# Patient Record
Sex: Female | Born: 1975
Health system: Southern US, Community
[De-identification: ages and names within clinical notes are randomized; demographics above are authoritative.]

## PROBLEM LIST (undated history)

## (undated) DIAGNOSIS — N750 Cyst of Bartholin's gland: Secondary | ICD-10-CM

## (undated) DIAGNOSIS — D649 Anemia, unspecified: Secondary | ICD-10-CM

## (undated) DIAGNOSIS — C539 Malignant neoplasm of cervix uteri, unspecified: Secondary | ICD-10-CM

## (undated) DIAGNOSIS — R002 Palpitations: Secondary | ICD-10-CM

## (undated) DIAGNOSIS — D219 Benign neoplasm of connective and other soft tissue, unspecified: Secondary | ICD-10-CM

## (undated) DIAGNOSIS — E119 Type 2 diabetes mellitus without complications: Secondary | ICD-10-CM

## (undated) DIAGNOSIS — J342 Deviated nasal septum: Secondary | ICD-10-CM

## (undated) DIAGNOSIS — G4730 Sleep apnea, unspecified: Secondary | ICD-10-CM

## (undated) DIAGNOSIS — Z923 Personal history of irradiation: Secondary | ICD-10-CM

## (undated) DIAGNOSIS — Z789 Other specified health status: Secondary | ICD-10-CM

## (undated) DIAGNOSIS — Z9289 Personal history of other medical treatment: Secondary | ICD-10-CM

## (undated) DIAGNOSIS — R7303 Prediabetes: Secondary | ICD-10-CM

## (undated) DIAGNOSIS — E669 Obesity, unspecified: Secondary | ICD-10-CM

## (undated) DIAGNOSIS — K5900 Constipation, unspecified: Secondary | ICD-10-CM

## (undated) DIAGNOSIS — G629 Polyneuropathy, unspecified: Secondary | ICD-10-CM

## (undated) DIAGNOSIS — N189 Chronic kidney disease, unspecified: Secondary | ICD-10-CM

## (undated) DIAGNOSIS — R0602 Shortness of breath: Secondary | ICD-10-CM

## (undated) DIAGNOSIS — I10 Essential (primary) hypertension: Secondary | ICD-10-CM

## (undated) DIAGNOSIS — Z87448 Personal history of other diseases of urinary system: Secondary | ICD-10-CM

## (undated) DIAGNOSIS — F32A Depression, unspecified: Secondary | ICD-10-CM

## (undated) DIAGNOSIS — F329 Major depressive disorder, single episode, unspecified: Secondary | ICD-10-CM

## (undated) DIAGNOSIS — E785 Hyperlipidemia, unspecified: Secondary | ICD-10-CM

## (undated) DIAGNOSIS — M1990 Unspecified osteoarthritis, unspecified site: Secondary | ICD-10-CM

## (undated) DIAGNOSIS — T783XXA Angioneurotic edema, initial encounter: Secondary | ICD-10-CM

## (undated) DIAGNOSIS — F419 Anxiety disorder, unspecified: Secondary | ICD-10-CM

## (undated) DIAGNOSIS — R112 Nausea with vomiting, unspecified: Secondary | ICD-10-CM

## (undated) DIAGNOSIS — R1310 Dysphagia, unspecified: Secondary | ICD-10-CM

## (undated) DIAGNOSIS — O1490 Unspecified pre-eclampsia, unspecified trimester: Secondary | ICD-10-CM

## (undated) DIAGNOSIS — Z9889 Other specified postprocedural states: Secondary | ICD-10-CM

## (undated) HISTORY — DX: Prediabetes: R73.03

## (undated) HISTORY — PX: DILATION AND CURETTAGE OF UTERUS: SHX78

## (undated) HISTORY — DX: Depression, unspecified: F32.A

## (undated) HISTORY — DX: Shortness of breath: R06.02

## (undated) HISTORY — DX: Anemia, unspecified: D64.9

## (undated) HISTORY — DX: Palpitations: R00.2

## (undated) HISTORY — DX: Chronic kidney disease, unspecified: N18.9

## (undated) HISTORY — DX: Unspecified osteoarthritis, unspecified site: M19.90

## (undated) HISTORY — DX: Personal history of irradiation: Z92.3

## (undated) HISTORY — DX: Anxiety disorder, unspecified: F41.9

## (undated) HISTORY — DX: Malignant neoplasm of cervix uteri, unspecified: C53.9

## (undated) HISTORY — DX: Hyperlipidemia, unspecified: E78.5

## (undated) HISTORY — DX: Benign neoplasm of connective and other soft tissue, unspecified: D21.9

## (undated) HISTORY — DX: Personal history of other diseases of urinary system: Z87.448

## (undated) HISTORY — DX: Angioneurotic edema, initial encounter: T78.3XXA

## (undated) HISTORY — DX: Deviated nasal septum: J34.2

## (undated) HISTORY — DX: Cyst of Bartholin's gland: N75.0

## (undated) HISTORY — DX: Sleep apnea, unspecified: G47.30

## (undated) HISTORY — DX: Dysphagia, unspecified: R13.10

## (undated) HISTORY — DX: Obesity, unspecified: E66.9

## (undated) HISTORY — DX: Constipation, unspecified: K59.00

---
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
## 2004-03-02 DIAGNOSIS — O1490 Unspecified pre-eclampsia, unspecified trimester: Secondary | ICD-10-CM

## 2004-03-02 HISTORY — DX: Unspecified pre-eclampsia, unspecified trimester: O14.90

## 2004-11-10 DIAGNOSIS — O1490 Unspecified pre-eclampsia, unspecified trimester: Secondary | ICD-10-CM

## 2016-04-04 ENCOUNTER — Ambulatory Visit (HOSPITAL_COMMUNITY)
Admission: EM | Admit: 2016-04-04 | Discharge: 2016-04-04 | Disposition: A | Discharge status: Home / Self Care | Payer: Self-pay | Attending: Family Medicine | Admitting: Family Medicine

## 2016-04-04 ENCOUNTER — Encounter (HOSPITAL_COMMUNITY): Payer: Self-pay | Admitting: *Deleted

## 2016-04-04 DIAGNOSIS — Z760 Encounter for issue of repeat prescription: Secondary | ICD-10-CM

## 2016-04-04 DIAGNOSIS — I10 Essential (primary) hypertension: Secondary | ICD-10-CM

## 2016-04-04 HISTORY — DX: Unspecified pre-eclampsia, unspecified trimester: O14.90

## 2016-04-04 HISTORY — DX: Essential (primary) hypertension: I10

## 2016-04-04 MED ORDER — AMLODIPINE BESYLATE 10 MG PO TABS: 10.0000 mg | ORAL_TABLET | Freq: Every day | ORAL | 0 refills | Status: DC

## 2016-04-04 NOTE — ED Triage Notes (Signed)
Denies any pain, just "feel it in my eyes" that BP is elevated.

## 2016-04-04 NOTE — ED Triage Notes (Signed)
Pt recently moved to [facility]; does not have PCP yet.  Ran out of amlodipine approx 1 wk ago.  Normally takes 10mg  daily.

## 2016-04-04 NOTE — ED Provider Notes (Signed)
CSN: [id number]     Arrival date & time 04/04/16  1446 History   First MD Initiated Contact with Patient 04/04/16 1631     Chief Complaint  Patient presents with  . Medication Refill   (Consider location/radiation/quality/duration/timing/severity/associated sxs/prior Treatment) She is a well-appearing 41 year old female, with history of hypertension currently on amlodipine 10 mg daily, presents today for medication refill for amlodipine. Patient ran out of her medication 1 week. Patient recently moved to the area and does not have a PCP yet, however she has appointment scheduled on the 02/12 with a new PCP. Patient states that she cannot wait until the 2/12 to get her BP medication. Patient denies chest pain, dizziness or shortness of breath. She does report a mild headache and a little blurry vision both onset today. She reports that her blood pressure normally gets this elevated when she is out of her medicine. She does not want to go to the ER.      Past Medical History:  Diagnosis Date  . Hypertension   . Preeclampsia    Past Surgical History:  Procedure Laterality Date  . CESAREAN SECTION    . DILATION AND CURETTAGE OF UTERUS     No family history on file. Social History  Substance Use Topics  . Smoking status: Never Smoker  . Smokeless tobacco: Never Used  . Alcohol use No   OB History    No data available     Review of Systems  Constitutional: Negative.   Eyes:       +blurry vision  Respiratory: Negative for cough and shortness of breath.   Cardiovascular: Negative for chest pain and palpitations.  Gastrointestinal: Negative for abdominal pain and nausea.  Neurological: Positive for headaches. Negative for dizziness.    Allergies  Penicillins and Sulfa antibiotics  Home Medications   Prior to Admission medications   Medication Sig Start Date End Date Taking? Authorizing Provider  amLODipine (NORVASC) 10 MG tablet Take 1 tablet (10 mg total) by mouth daily.  04/04/16 07/03/16  Barry Dienes, NP   Meds Ordered and Administered this Visit  Medications - No data to display  BP (!) 219/108 (BP Location: Right Arm)   Pulse 101   Temp 98 F (36.7 C) (Oral)   Resp 16   LMP 03/14/2016 (Approximate)   SpO2 99%  No data found.   Physical Exam  Urgent Care Course     Procedures (including critical care time)  Labs Review Labs Reviewed - No data to display  Imaging Review No results found.  MDM   1. Essential hypertension   2. Medication refill    Physical exam unremarkable. She appears well. She refused ER visit. Amlodipine refilled. Informed to f/u as scheduled with the PCP. Return precaution discussed.     Barry Dienes, NP 04/04/16 [phone]

## 2016-06-28 ENCOUNTER — Ambulatory Visit (HOSPITAL_COMMUNITY)
Admission: EM | Admit: 2016-06-28 | Discharge: 2016-06-28 | Disposition: A | Discharge status: Home / Self Care | Payer: Self-pay | Attending: Internal Medicine | Admitting: Internal Medicine

## 2016-06-28 ENCOUNTER — Encounter (HOSPITAL_COMMUNITY): Payer: Self-pay | Admitting: Emergency Medicine

## 2016-06-28 DIAGNOSIS — I10 Essential (primary) hypertension: Secondary | ICD-10-CM

## 2016-06-28 DIAGNOSIS — Z760 Encounter for issue of repeat prescription: Secondary | ICD-10-CM

## 2016-06-28 MED ORDER — CLONIDINE HCL 0.1 MG PO TABS: 0.1000 mg | ORAL_TABLET | Freq: Two times a day (BID) | ORAL | 0 refills | Status: DC | PRN

## 2016-06-28 MED ORDER — TRIAMTERENE-HCTZ 37.5-25 MG PO CAPS: 1.0000 | ORAL_CAPSULE | Freq: Every day | ORAL | 0 refills | Status: DC

## 2016-06-28 MED ORDER — CLONIDINE HCL 0.1 MG PO TABS
ORAL_TABLET | ORAL | Status: AC
  Filled 2016-06-28: qty 2

## 2016-06-28 MED ORDER — AMLODIPINE BESYLATE 10 MG PO TABS: 10.0000 mg | ORAL_TABLET | Freq: Every day | ORAL | 0 refills | Status: DC

## 2016-06-28 MED ORDER — CLONIDINE HCL 0.1 MG PO TABS
0.2000 mg | ORAL_TABLET | Freq: Once | ORAL | Status: AC
  Administered 2016-06-28: 0.2 mg via ORAL

## 2016-06-28 NOTE — ED Notes (Signed)
Dr. Valere Dross advised of patient's BP. Advised to give 0.2 mg of clonidine.

## 2016-06-28 NOTE — ED Provider Notes (Signed)
[place]    CSN: [id number] Arrival date & time: 06/28/16  1233     History   Chief Complaint Chief Complaint  Patient presents with  . Medication Refill    HPI Tamara Osborne is a 41 y.o. female. She has hypertension, and is out of her blood pressure medicine. She does have an appointment with a primary care provider in [place], to establish care, but was not able to get an appointment until the end of June. No chest pain, no headache. She does sometimes see spots when her blood pressure is very high, and she observed this earlier today. She is here for high blood pressure medicine refill.  Not short of breath, not coughing. Does have distal lower extremity swelling, symmetric    HPI  Past Medical History:  Diagnosis Date  . Hypertension   . Preeclampsia      Past Surgical History:  Procedure Laterality Date  . CESAREAN SECTION    . DILATION AND CURETTAGE OF UTERUS       Home Medications    Prior to Admission medications   Medication Sig Start Date End Date Taking? Authorizing Provider  amLODipine (NORVASC) 10 MG tablet Take 1 tablet (10 mg total) by mouth daily. 06/28/16 09/26/16  Sherlene Shams, MD  cloNIDine (CATAPRES) 0.1 MG tablet Take 1 tablet (0.1 mg total) by mouth 2 (two) times daily as needed (blood pressure >180/110). 06/28/16   Sherlene Shams, MD  triamterene-hydrochlorothiazide (DYAZIDE) 37.5-25 MG capsule Take 1 each (1 capsule total) by mouth daily. For 3 days, then as needed for leg swelling. 06/28/16 07/08/16  Sherlene Shams, MD    Family History History reviewed. No pertinent family history.  Social History Social History  Substance Use Topics  . Smoking status: Never Smoker  . Smokeless tobacco: Never Used  . Alcohol use No     Allergies   Penicillins and Sulfa antibiotics   Review of Systems Review of Systems  All other systems reviewed and are negative.    Physical Exam Triage Vital Signs ED Triage Vitals  Enc  Vitals Group     BP 06/28/16 1358 (!) 224/128     Pulse Rate 06/28/16 1358 (!) 101     Resp 06/28/16 1358 20     Temp 06/28/16 1358 98.4 F (36.9 C)     Temp Source 06/28/16 1358 Oral     SpO2 06/28/16 1358 99 %     Weight --      Height --      Pain Score 06/28/16 1356 0     Pain Loc --    Updated Vital Signs BP (!) 222/120 (BP Location: Left Wrist)   Pulse 99   Temp 98.4 F (36.9 C) (Oral)   Resp 18   SpO2 98%   Physical Exam  Constitutional: She is oriented to person, place, and time. No distress.  HENT:  Head: Atraumatic.  Eyes:  Conjugate gaze observed, no eye redness/discharge  Neck: Neck supple.  Cardiovascular: Normal rate and regular rhythm.   S4 gallop  Pulmonary/Chest: No respiratory distress. She has no wheezes.  Lungs are clear with possible bibasilar fine crackles, otherwise unremarkable  Abdominal: She exhibits no distension.  Musculoskeletal: Normal range of motion.  Distal lower extremity edema, 2+, pitting  Neurological: She is alert and oriented to person, place, and time.  Face is symmetric, speech is clear/coherent Able to walk into the urgent care independently, climb on/off the exam table  Skin: Skin  is warm and dry.  Nursing note and vitals reviewed.    UC Treatments / Results   Procedures Procedures (including critical care time)  Medications Ordered in UC Medications  cloNIDine (CATAPRES) tablet 0.2 mg (0.2 mg Oral Given 06/28/16 1414)     Final Clinical Impressions(s) / UC Diagnoses   Final diagnoses:  Medication refill  Hypertension, unspecified type   Please keep appointment with primary care provider late June 2018 to establish care.  Prescriptions for amlodipine (to take regularly) and clonidine (to use as needed) were sent to the [facility] on [address].  A dose of clonidine 0.2 mg was given at the urgent care today and is expected to work gradually over the next several hours.  Meds ordered this encounter  Medications  .  amLODipine (NORVASC) 10 MG tablet    Sig: Take 1 tablet (10 mg total) by mouth daily.    Dispense:  90 tablet    Refill:  0  . cloNIDine (CATAPRES) 0.1 MG tablet    Sig: Take 1 tablet (0.1 mg total) by mouth 2 (two) times daily as needed (blood pressure >180/110).    Dispense:  50 tablet    Refill:  0  . triamterene-hydrochlorothiazide (DYAZIDE) 37.5-25 MG capsule    Sig: Take 1 each (1 capsule total) by mouth daily. For 3 days, then as needed for leg swelling.    Dispense:  10 capsule    Refill:  0      Sherlene Shams, MD 07/01/16 [phone]

## 2016-06-28 NOTE — Discharge Instructions (Addendum)
Please keep appointment with primary care provider late June 2018 to establish care.  Prescriptions for amlodipine (to take regularly) and clonidine (to use as needed) were sent to the [facility] on [address].  A dose of clonidine 0.2 mg was given at the urgent care today and is expected to work gradually over the next several hours.

## 2016-06-28 NOTE — ED Triage Notes (Signed)
The patient presented to the [facility] with a complaint of needing her HTN medication refilled.

## 2019-03-13 ENCOUNTER — Other Ambulatory Visit: Payer: Self-pay

## 2019-03-13 DIAGNOSIS — N939 Abnormal uterine and vaginal bleeding, unspecified: Secondary | ICD-10-CM | POA: Insufficient documentation

## 2019-03-13 DIAGNOSIS — Z5321 Procedure and treatment not carried out due to patient leaving prior to being seen by health care provider: Secondary | ICD-10-CM | POA: Insufficient documentation

## 2019-03-13 NOTE — ED Triage Notes (Signed)
Patient stating that she has significant bleeding due to her menstrual cycle for the last week. States passing large clots, which is not abnormal for her, but is lasting longer than usual.

## 2019-03-14 ENCOUNTER — Encounter (HOSPITAL_COMMUNITY): Payer: Self-pay

## 2019-03-14 ENCOUNTER — Ambulatory Visit (HOSPITAL_COMMUNITY)
Admission: EM | Admit: 2019-03-14 | Discharge: 2019-03-14 | Disposition: A | Discharge status: Home / Self Care | Payer: Self-pay | Attending: Family Medicine | Admitting: Family Medicine

## 2019-03-14 ENCOUNTER — Other Ambulatory Visit: Payer: Self-pay

## 2019-03-14 ENCOUNTER — Emergency Department (HOSPITAL_COMMUNITY)
Admission: EM | Admit: 2019-03-14 | Discharge: 2019-03-14 | Disposition: A | Discharge status: Home / Self Care | Payer: Medicaid Other | Attending: Emergency Medicine | Admitting: Emergency Medicine

## 2019-03-14 DIAGNOSIS — N921 Excessive and frequent menstruation with irregular cycle: Secondary | ICD-10-CM

## 2019-03-14 MED ORDER — MEDROXYPROGESTERONE ACETATE 10 MG PO TABS: 10.0000 mg | ORAL_TABLET | Freq: Every day | ORAL | 0 refills | Status: DC

## 2019-03-14 NOTE — ED Notes (Signed)
Pt not seen in lobby when called for vitals.

## 2019-03-14 NOTE — ED Provider Notes (Signed)
[place]    CSN: [id number] Arrival date & time: 03/14/19  1219      History   Chief Complaint Chief Complaint  Patient presents with  . period issues    HPI Tamara Osborne is a 44 y.o. female.   Patient is a 44 year old female past medical history of hypertension.  She presents today with heavy menstrual cycle.  Reporting started her menstrual cycle approximately 1 week ago.  She has had some large clots at times.  Reporting the bleeding is worse at nighttime. Wearing a diaper but not soaking through.  Denies any abdominal pain or pelvic pain.  Has had menorrhagia her whole life.  Had tubal ligation for birth control.  Does not currently have an OB/GYN. Has been told in the past that she may need an ablation.  Denies any fever, dysuria, hematuria or urinary frequency.  Not currently sexually active.  No concern for STDs. Hx of anemia but denies any weakness, dizziness, shortness of breath, lightheadedness.   ROS per HPI      Past Medical History:  Diagnosis Date  . Hypertension   . Preeclampsia     There are no problems to display for this patient.   Past Surgical History:  Procedure Laterality Date  . CESAREAN SECTION    . DILATION AND CURETTAGE OF UTERUS      OB History   No obstetric history on file.      Home Medications    Prior to Admission medications   Medication Sig Start Date End Date Taking? Authorizing Provider  amLODipine (NORVASC) 10 MG tablet Take 1 tablet (10 mg total) by mouth daily. 06/28/16 09/26/16  Wynona Luna, MD  cloNIDine (CATAPRES) 0.1 MG tablet Take 1 tablet (0.1 mg total) by mouth 2 (two) times daily as needed (blood pressure >180/110). 06/28/16   Wynona Luna, MD  medroxyPROGESTERone (PROVERA) 10 MG tablet Take 1 tablet (10 mg total) by mouth daily for 10 days. 03/14/19 03/24/19  Loura Halt A, NP  triamterene-hydrochlorothiazide (DYAZIDE) 37.5-25 MG capsule Take 1 each (1 capsule total) by mouth daily.  For 3 days, then as needed for leg swelling. 06/28/16 07/08/16  Wynona Luna, MD    Family History History reviewed. No pertinent family history.  Social History Social History   Tobacco Use  . Smoking status: Never Smoker  . Smokeless tobacco: Never Used  Substance Use Topics  . Alcohol use: No  . Drug use: No     Allergies   Penicillins and Sulfa antibiotics   Review of Systems Review of Systems   Physical Exam Triage Vital Signs ED Triage Vitals  Enc Vitals Group     BP 03/14/19 1254 (!) 191/89     Pulse Rate 03/14/19 1254 (!) 105     Resp 03/14/19 1254 18     Temp 03/14/19 1254 98.7 F (37.1 C)     Temp Source 03/14/19 1254 Oral     SpO2 03/14/19 1254 100 %     Weight 03/14/19 1257 215 lb (97.5 kg)     Height --      Head Circumference --      Peak Flow --      Pain Score 03/14/19 1256 6     Pain Loc --      Pain Edu? --      Excl. in [place]? --    No data found.  Updated Vital Signs BP (!) 191/89 (BP Location: Right Arm)   Pulse Marland Kitchen)  105   Temp 98.7 F (37.1 C) (Oral)   Resp 18   Wt 215 lb (97.5 kg)   LMP 03/14/2019   SpO2 100%   BMI 39.32 kg/m   Visual Acuity Right Eye Distance:   Left Eye Distance:   Bilateral Distance:    Right Eye Near:   Left Eye Near:    Bilateral Near:     Physical Exam Vitals and nursing note reviewed.  Constitutional:      General: She is not in acute distress.    Appearance: Normal appearance. She is not ill-appearing, toxic-appearing or diaphoretic.  HENT:     Head: Normocephalic.     Nose: Nose normal.     Mouth/Throat:     Pharynx: Oropharynx is clear.  Eyes:     Conjunctiva/sclera: Conjunctivae normal.  Pulmonary:     Effort: Pulmonary effort is normal.  Abdominal:     Palpations: Abdomen is soft.     Tenderness: There is no abdominal tenderness.  Musculoskeletal:        General: Normal range of motion.     Cervical back: Normal range of motion.  Skin:    General: Skin is warm and dry.      Findings: No rash.  Neurological:     Mental Status: She is alert.  Psychiatric:        Mood and Affect: Mood normal.      UC Treatments / Results  Labs (all labs ordered are listed, but only abnormal results are displayed) Labs Reviewed - No data to display  EKG   Radiology No results found.  Procedures Procedures (including critical care time)  Medications Ordered in UC Medications - No data to display  Initial Impression / Assessment and Plan / UC Course  I have reviewed the triage vital signs and the nursing notes.  Pertinent labs & imaging results that were available during my care of the patient were reviewed by me and considered in my medical decision making (see chart for details).     Metrorrhagia- giving provera to stop the bleeding.  She is not having any pain and there is no concern for pregnancy or ectopic pregnancy at this time.  She could have a uterine  fibroid.  Recommend she follows up with OB/GYN for further management.  If symptoms worsen and she starts having signs or symptoms of anemia she will need to go to the ER Pt understanding and agree.  Final Clinical Impressions(s) / UC Diagnoses   Final diagnoses:  Metrorrhagia     Discharge Instructions     Take the medication daily for 10 days.  Call the OB/GYN listed above If symptoms worsen go to the ER.     ED Prescriptions    Medication Sig Dispense Auth. Provider   medroxyPROGESTERone (PROVERA) 10 MG tablet Take 1 tablet (10 mg total) by mouth daily for 10 days. 10 tablet Loura Halt A, NP     PDMP not reviewed this encounter.   Orvan July, NP 03/14/19 1529

## 2019-03-14 NOTE — Discharge Instructions (Signed)
Take the medication daily for 10 days.  Call the OB/GYN listed above If symptoms worsen go to the ER.

## 2019-03-14 NOTE — ED Triage Notes (Signed)
Pt states she has a issue with her period. Pt states she  was bleeding a lot yesterday.

## 2019-03-15 ENCOUNTER — Encounter (HOSPITAL_COMMUNITY): Payer: Self-pay | Admitting: Emergency Medicine

## 2019-03-15 ENCOUNTER — Emergency Department (HOSPITAL_COMMUNITY)
Admission: EM | Admit: 2019-03-15 | Discharge: 2019-03-16 | Disposition: A | Discharge status: Home / Self Care | Payer: Medicaid Other | Attending: Emergency Medicine | Admitting: Emergency Medicine

## 2019-03-15 DIAGNOSIS — Z79899 Other long term (current) drug therapy: Secondary | ICD-10-CM | POA: Insufficient documentation

## 2019-03-15 DIAGNOSIS — I10 Essential (primary) hypertension: Secondary | ICD-10-CM | POA: Insufficient documentation

## 2019-03-15 DIAGNOSIS — F419 Anxiety disorder, unspecified: Secondary | ICD-10-CM | POA: Diagnosis not present

## 2019-03-15 DIAGNOSIS — D500 Iron deficiency anemia secondary to blood loss (chronic): Secondary | ICD-10-CM

## 2019-03-15 LAB — CBC WITH DIFFERENTIAL/PLATELET
Abs Immature Granulocytes: 0.26 10*3/uL — ABNORMAL HIGH (ref 0.00–0.07)
Basophils Absolute: 0.1 10*3/uL (ref 0.0–0.1)
Basophils Relative: 0 %
Eosinophils Absolute: 0.4 10*3/uL (ref 0.0–0.5)
Eosinophils Relative: 2 %
HCT: 20.7 % — ABNORMAL LOW (ref 36.0–46.0)
Hemoglobin: 6.1 g/dL — CL (ref 12.0–15.0)
Immature Granulocytes: 1 %
Lymphocytes Relative: 11 %
Lymphs Abs: 2 10*3/uL (ref 0.7–4.0)
MCH: 22.1 pg — ABNORMAL LOW (ref 26.0–34.0)
MCHC: 29.5 g/dL — ABNORMAL LOW (ref 30.0–36.0)
MCV: 75 fL — ABNORMAL LOW (ref 80.0–100.0)
Monocytes Absolute: 0.9 10*3/uL (ref 0.1–1.0)
Monocytes Relative: 5 %
Neutro Abs: 14.8 10*3/uL — ABNORMAL HIGH (ref 1.7–7.7)
Neutrophils Relative %: 81 %
Platelets: 623 10*3/uL — ABNORMAL HIGH (ref 150–400)
RBC: 2.76 MIL/uL — ABNORMAL LOW (ref 3.87–5.11)
RDW: 17.3 % — ABNORMAL HIGH (ref 11.5–15.5)
WBC: 18.5 10*3/uL — ABNORMAL HIGH (ref 4.0–10.5)
nRBC: 0.5 % — ABNORMAL HIGH (ref 0.0–0.2)

## 2019-03-15 LAB — BASIC METABOLIC PANEL
Anion gap: 11 (ref 5–15)
BUN: 18 mg/dL (ref 6–20)
CO2: 20 mmol/L — ABNORMAL LOW (ref 22–32)
Calcium: 8.8 mg/dL — ABNORMAL LOW (ref 8.9–10.3)
Chloride: 108 mmol/L (ref 98–111)
Creatinine, Ser: 1.37 mg/dL — ABNORMAL HIGH (ref 0.44–1.00)
GFR calc Af Amer: 55 mL/min — ABNORMAL LOW (ref >60)
GFR calc non Af Amer: 47 mL/min — ABNORMAL LOW (ref >60)
Glucose, Bld: 108 mg/dL — ABNORMAL HIGH (ref 70–99)
Potassium: 4 mmol/L (ref 3.5–5.1)
Sodium: 139 mmol/L (ref 135–145)

## 2019-03-15 LAB — PREPARE RBC (CROSSMATCH)

## 2019-03-15 LAB — ABO/RH: ABO/RH(D): A NEG

## 2019-03-15 MED ORDER — SODIUM CHLORIDE 0.9 % IV SOLN
10.0000 mL/h | Freq: Once | INTRAVENOUS | Status: AC
  Administered 2019-03-15: 21:00:00 10 mL/h via INTRAVENOUS

## 2019-03-15 NOTE — ED Notes (Signed)
Pt ambulated to restroom with assistance. Pt back in bed and resting comfortably.

## 2019-03-15 NOTE — ED Provider Notes (Signed)
[place] EMERGENCY DEPARTMENT Provider Note   CSN: [id number] Arrival date & time: 03/15/19  1110     History Chief Complaint  Patient presents with  . Hypertension    Tamara Osborne is a 44 y.o. female with a past medical history of hypertension currently on clonidine as needed and amlodipine daily presenting to the ED for hypertension and increased anxiety.  States that for the past few days she has been under increased stress and anxiety due to increased menstrual bleeding.  She states that she has a longstanding history of "white coat hypertension because seeing blood and being in hospitals makes me so scared, I lost both my parents in my 29s and had a miscarriage a long time ago."  She is concerned that the recent bleeding pattern made her more nervous and caused her blood pressure to increase, as she noticed that instead of her heavy bleeding for 3 days, she had heavy bleeding for 4 days. She has had heavy cycles for her whole life. She was also worried that she would not be able to see the new OB/GYN she was referred to yesterday but was called while in the waiting room that she had an appointment scheduled for tomorrow.  She has been checking her blood pressures at home and did take some clonidine yesterday with improvement in her symptoms.  She denies any chest pain, headache, vision changes, shortness of breath, changes to urination, abdominal pain, fevers.  She was given hormonal therapy for her heavy bleeding yesterday at urgent care and her bleeding has completely stopped. She reports feeling calmer since being able to schedule her appointment for tomorrow.  HPI     Past Medical History:  Diagnosis Date  . Hypertension   . Preeclampsia     There are no problems to display for this patient.   Past Surgical History:  Procedure Laterality Date  . CESAREAN SECTION    . DILATION AND CURETTAGE OF UTERUS       OB History   No obstetric history on  file.     No family history on file.  Social History   Tobacco Use  . Smoking status: Never Smoker  . Smokeless tobacco: Never Used  Substance Use Topics  . Alcohol use: No  . Drug use: No    Home Medications Prior to Admission medications   Medication Sig Start Date End Date Taking? Authorizing Provider  amLODipine (NORVASC) 10 MG tablet Take 1 tablet (10 mg total) by mouth daily. 06/28/16 03/15/19 Yes Wynona Luna, MD  aspirin EC 81 MG tablet Take 81 mg by mouth as needed (if BP is elevated).   Yes [provider]  cholecalciferol (VITAMIN D3) 25 MCG (1000 UNIT) tablet Take 1,000 Units by mouth daily.   Yes [provider]  cloNIDine (CATAPRES) 0.1 MG tablet Take 1 tablet (0.1 mg total) by mouth 2 (two) times daily as needed (blood pressure >180/110). 06/28/16  Yes Wynona Luna, MD  fluticasone [facility]) 50 MCG/ACT nasal spray Place 1 spray into both nostrils daily as needed for allergies or rhinitis.   Yes [provider]  medroxyPROGESTERone (PROVERA) 10 MG tablet Take 1 tablet (10 mg total) by mouth daily for 10 days. 03/14/19 03/24/19 Yes Bast, Traci A, NP  triamterene-hydrochlorothiazide (DYAZIDE) 37.5-25 MG capsule Take 1 each (1 capsule total) by mouth daily. For 3 days, then as needed for leg swelling. 06/28/16 07/08/16  Wynona Luna, MD    Allergies  Penicillins, Shellfish allergy, and Sulfa antibiotics  Review of Systems   Review of Systems  Constitutional: Negative for appetite change, chills and fever.  HENT: Negative for ear pain, rhinorrhea, sneezing and sore throat.   Eyes: Negative for photophobia and visual disturbance.  Respiratory: Negative for cough, chest tightness, shortness of breath and wheezing.   Cardiovascular: Negative for chest pain and palpitations.  Gastrointestinal: Negative for abdominal pain, blood in stool, constipation, diarrhea, nausea and vomiting.  Genitourinary: Negative for dysuria,  hematuria and urgency.  Musculoskeletal: Negative for myalgias.  Skin: Negative for rash.  Neurological: Negative for dizziness, weakness and light-headedness.    Physical Exam Updated Vital Signs BP (!) 166/79   Pulse (!) 112   Temp 99.3 F (37.4 C)   Resp 16   LMP 03/14/2019   SpO2 97%   Physical Exam Vitals and nursing note reviewed.  Constitutional:      General: She is not in acute distress.    Appearance: She is well-developed. She is obese.     Comments: Anxious. Tearful at times when speaking out her stress. No distress noted otherwise.  HENT:     Head: Normocephalic and atraumatic.     Nose: Nose normal.  Eyes:     General: No scleral icterus.       Right eye: No discharge.        Left eye: No discharge.     Conjunctiva/sclera: Conjunctivae normal.     Pupils: Pupils are equal, round, and reactive to light.  Cardiovascular:     Rate and Rhythm: Regular rhythm. Tachycardia present.     Heart sounds: Normal heart sounds. No murmur. No friction rub. No gallop.   Pulmonary:     Effort: Pulmonary effort is normal. No respiratory distress.     Breath sounds: Normal breath sounds.  Abdominal:     General: Bowel sounds are normal. There is no distension.     Palpations: Abdomen is soft.     Tenderness: There is no abdominal tenderness. There is no guarding.  Musculoskeletal:        General: Normal range of motion.     Cervical back: Normal range of motion and neck supple.  Skin:    General: Skin is warm and dry.     Findings: No rash.  Neurological:     General: No focal deficit present.     Mental Status: She is alert and oriented to person, place, and time.     Cranial Nerves: No cranial nerve deficit.     Sensory: No sensory deficit.     Motor: No weakness or abnormal muscle tone.     Coordination: Coordination normal.     ED Results / Procedures / Treatments   Labs (all labs ordered are listed, but only abnormal results are displayed) Labs Reviewed   BASIC METABOLIC PANEL - Abnormal; Notable for the following components:      Result Value   CO2 20 (*)    Glucose, Bld 108 (*)    Creatinine, Ser 1.37 (*)    Calcium 8.8 (*)    GFR calc non Af Amer 47 (*)    GFR calc Af Amer 55 (*)    All other components within normal limits  CBC WITH DIFFERENTIAL/PLATELET - Abnormal; Notable for the following components:   WBC 18.5 (*)    RBC 2.76 (*)    Hemoglobin 6.1 (*)    HCT 20.7 (*)    MCV 75.0 (*)    [facility]  22.1 (*)    MCHC 29.5 (*)    RDW 17.3 (*)    Platelets 623 (*)    nRBC 0.5 (*)    Neutro Abs 14.8 (*)    Abs Immature Granulocytes 0.26 (*)    All other components within normal limits  TYPE AND SCREEN  PREPARE RBC (CROSSMATCH)    EKG EKG Interpretation  Date/Time:  Wednesday March 15 2019 12:26:22 EST Ventricular Rate:  122 PR Interval:    QRS Duration: 85 QT Interval:  352 QTC Calculation: 502 R Axis:   45 Text Interpretation: Sinus tachycardia LVH with secondary repolarization abnormality Borderline prolonged QT interval Confirmed by Lennice Sites [phone]) on 03/15/2019 1:49:52 PM   Radiology No results found.  Procedures Procedures (including critical care time)  Medications Ordered in ED Medications  0.9 %  sodium chloride infusion (has no administration in time range)    ED Course  I have reviewed the triage vital signs and the nursing notes.  Pertinent labs & imaging results that were available during my care of the patient were reviewed by me and considered in my medical decision making (see chart for details).  Clinical Course as of Mar 14 1521  Wed Mar 15, 2019  1418 Will transfuse 2 units.  Hemoglobin(!!): 6.1 [HK]    Clinical Course User Index [HK] Delia Heady, PA-C   MDM Rules/Calculators/A&P                      44 year old female with a past medical history of heavy menstrual cycles and anemia presenting to the ED with hypertension.  She was seen evaluated in urgent care yesterday for her  menstrual cycle which she was primarily concerned about because the heavy bleeding lasted a day longer than he usually does.  She does admit that she is overly anxious and stressed whenever she has any changes in her body.  She believes this is the cause of her hypertension.  She did 3 doses of clonidine yesterday with improvement.  She returns today because she has hypertension today when checking her blood pressure at home.  She was given Provera at urgent care yesterday and this completely resolved her bleeding.  She is scheduled to see her new OB/GYN provider tomorrow.  As far as she knows, she does recall being told she was anemic in the past but has never required a transfusion.  She does not take iron supplements.  She has no neurological deficits and denies any chest pain, shortness of breath, headache or vision changes.  Abdomen is soft, nontender nondistended.  Work-up here significant for hemoglobin of 6.1 which I feel is most likely the cause of her tachycardia and hypertension.  She is agreeable to transfusion here and will reassess afterwards.  She will most likely be discharged home as she prefers this and "I do not want to be in the hospital overnight or go into any elevators so as long as I can get blood in this room I'm okay with that."  Will transfuse 2 units of blood. Will most likely discharge home after transfusion if this goes without difficulty. Care handed off to oncoming provider.  Final Clinical Impression(s) / ED Diagnoses Final diagnoses:  Iron deficiency anemia due to chronic blood loss    Rx / DC Orders ED Discharge Orders    None     Portions of this note were generated with Dragon dictation software. Dictation errors may occur despite best attempts at proofreading.  Delia Heady, PA-C 03/15/19 [place], [place], DO 03/15/19 [phone]

## 2019-03-15 NOTE — ED Notes (Signed)
Labs drawn in triage if needed after provider sees patient.

## 2019-03-15 NOTE — ED Triage Notes (Signed)
Pt states hx of htn which she takes medications from, states she has been under a lot of stress and felt like her bp was up due to feeling a heart beat her in ears, states bp was high when she checked it at home, pt tearful in triage, denies pain.

## 2019-03-15 NOTE — Discharge Instructions (Signed)
Follow up with the OB/GYN you are referred to at your scheduled appointment tomorrow. Return to the ED if you start to develop worsening symptoms, loss of consciousness, chest pain, shortness of breath or blurry vision.

## 2019-03-16 ENCOUNTER — Other Ambulatory Visit (HOSPITAL_COMMUNITY)
Admission: RE | Admit: 2019-03-16 | Discharge: 2019-03-16 | Disposition: A | Discharge status: Home / Self Care | Payer: Medicaid Other | Source: Ambulatory Visit | Attending: Obstetrics & Gynecology | Admitting: Obstetrics & Gynecology

## 2019-03-16 ENCOUNTER — Encounter: Payer: Self-pay | Admitting: Obstetrics & Gynecology

## 2019-03-16 ENCOUNTER — Ambulatory Visit (INDEPENDENT_AMBULATORY_CARE_PROVIDER_SITE_OTHER): Payer: Self-pay

## 2019-03-16 ENCOUNTER — Other Ambulatory Visit: Payer: Self-pay

## 2019-03-16 ENCOUNTER — Ambulatory Visit (INDEPENDENT_AMBULATORY_CARE_PROVIDER_SITE_OTHER): Payer: Self-pay | Admitting: Obstetrics & Gynecology

## 2019-03-16 VITALS — BP 160/90 | HR 95 | Temp 97.9°F | Ht 62.5 in | Wt 206.2 lb

## 2019-03-16 DIAGNOSIS — N852 Hypertrophy of uterus: Secondary | ICD-10-CM

## 2019-03-16 DIAGNOSIS — N920 Excessive and frequent menstruation with regular cycle: Secondary | ICD-10-CM

## 2019-03-16 DIAGNOSIS — D500 Iron deficiency anemia secondary to blood loss (chronic): Secondary | ICD-10-CM

## 2019-03-16 DIAGNOSIS — Z124 Encounter for screening for malignant neoplasm of cervix: Secondary | ICD-10-CM

## 2019-03-16 DIAGNOSIS — N83201 Unspecified ovarian cyst, right side: Secondary | ICD-10-CM

## 2019-03-16 MED ORDER — NORETHINDRONE ACETATE 5 MG PO TABS: 5.0000 mg | ORAL_TABLET | Freq: Two times a day (BID) | ORAL | 0 refills | Status: DC

## 2019-03-16 NOTE — ED Notes (Signed)
Discharge instructions reviewed with pt. Pt verbalized understanding.   

## 2019-03-16 NOTE — Patient Instructions (Signed)
Start taking two provera tablets daily (20mg ).  When this is done, start taking the norethindrone 5mg  twice daily.  Start taking over the counter iron twice daily and take this with orange juice.    I will see you back in two weeks.  We will do blood work and possibly a biopsy inside the uterus that day.    If your bleeding increases, please call.

## 2019-03-16 NOTE — Progress Notes (Signed)
44 y.o. No obstetric history on file. Single White or Caucasian female here as new GYN appt in follow up from the ER.  Reports cycles are regular, about 30 days apart, but are heavy.  Typically they are heavy for 3 days and then lessen for another four days.  Then she has spotting that is pink or brown for three or four days.  Bleeding and spotting can last up to 14 days.  Hasn't been SA in the last 8 months due to the amount of bleeding she has.  With this last cycle, her bleeding was different.  The bleeding was so heavy at times, that she needed a diaper.  She was passing large clots that were more larger than normal.  She's been told she has some anemia in the past but it has never been this low.  She was having lightheaded sensation, palpitations and SOB as well.  She was getting winded walking to the mail box or up a few stairs.  She didn't feel have increased cramping with the clots.    Patient's last menstrual period was 03/14/2019.          Sexually active: No.  The current method of family planning is tubal ligation.    Exercising: No.   Smoker:  no  Health Maintenance: Pap:  2019, normal History of abnormal Pap:  yes MMG: 2019 per pt Colonoscopy: none BMD:  never TDaP: unsure  Pneumonia vaccine(s): na Shingrix:  na Hep C testing: unsure Screening Labs: PCP   reports that she has never smoked. She has never used smokeless tobacco. She reports that she does not drink alcohol or use drugs.  Past Medical History:  Diagnosis Date  . Hypertension   . Preeclampsia     Past Surgical History:  Procedure Laterality Date  . CESAREAN SECTION    . DILATION AND CURETTAGE OF UTERUS      Current Outpatient Medications  Medication Sig Dispense Refill  . amLODipine (NORVASC) 10 MG tablet Take 1 tablet (10 mg total) by mouth daily. 90 tablet 0  . aspirin EC 81 MG tablet Take 81 mg by mouth as needed (if BP is elevated).    . cholecalciferol (VITAMIN D3) 25 MCG (1000 UNIT) tablet Take  1,000 Units by mouth daily.    . cloNIDine (CATAPRES) 0.1 MG tablet Take 1 tablet (0.1 mg total) by mouth 2 (two) times daily as needed (blood pressure >180/110). 50 tablet 0  . fluticasone (FLONASE) 50 MCG/ACT nasal spray Place 1 spray into both nostrils daily as needed for allergies or rhinitis.    . medroxyPROGESTERone (PROVERA) 10 MG tablet Take 1 tablet (10 mg total) by mouth daily for 10 days. 10 tablet 0  . triamterene-hydrochlorothiazide (DYAZIDE) 37.5-25 MG capsule Take 1 each (1 capsule total) by mouth daily. For 3 days, then as needed for leg swelling. 10 capsule 0   No current facility-administered medications for this visit.    No family history on file.  Review of Systems  Genitourinary:       Bleeding with clots x 7 days  All other systems reviewed and are negative.   Exam:   Vitals:   03/16/19 1308  BP: (!) 160/90  Pulse: 95  Temp: 97.9 F (36.6 C)   General appearance: alert, cooperative and appears stated age Head: Normocephalic, without obvious abnormality, atraumatic Neck: no adenopathy, supple, symmetrical, trachea midline and thyroid normal to inspection and palpation Lungs: clear to auscultation bilaterally Heart: regular rate and rhythm Abdomen:  soft, non-tender; bowel sounds normal; no masses,  no organomegaly Extremities: extremities normal, atraumatic, no cyanosis or edema Skin: Skin color, texture, turgor normal. No rashes or lesions Lymph nodes: Cervical, supraclavicular, and axillary nodes normal. No abnormal inguinal nodes palpated Neurologic: Grossly normal   Pelvic: External genitalia:  no lesions              Urethra:  normal appearing urethra with no masses, tenderness or lesions              Bartholins and Skenes: normal                 Vagina: normal appearing vagina with normal color and discharge, no lesions              Cervix: no lesions              Pap taken: Yes.   Bimanual Exam:  Uterus:  enlarged, 18, feels fixed weeks size               Adnexa: cannnot distinguish from her uterus               Rectovaginal: Confirms               Anus:  normal sphincter tone, no lesions  Chaperone, Marisa Sprinkles, CMA, was present for exam.  A:  H/o menorrhagia Enlarged uterus/vs pelvic mass on exam today Anemia S/p transfusion x 2 yesterday, iron deficient most likely H/o cesarean section and 3 NSVDs  P:   PUS obtained today (uterus 13 x 2 x 7 x 7cm with two 2.5cm fibroids and 77mm endometrium.  Left ovary 3.2 x 2.7 x 2.3cm.  Right ovary 3.0 x 1.2 x 1.8cm with 8.6 x 8.0 x 11cm simple appearing cyst beside the ovary, no free fluid) Pap and HR HPV obtained today Recheck 2 weeks. Start iron twice daily Switch to norethindrone 5mg  bid once done with provera 10mg  bid (advised to start this bid today) Declined blood work today.  Needs iron studies and ca-125 with follow up.  Future labs placed.   Will also plan endometrial biopsy when she returns.

## 2019-03-16 NOTE — Progress Notes (Addendum)
Ultrasound results documented in OV from same day.

## 2019-03-17 LAB — TYPE AND SCREEN
ABO/RH(D): A NEG
Antibody Screen: NEGATIVE
Unit division: 0
Unit division: 0

## 2019-03-17 LAB — BPAM RBC
Blood Product Expiration Date: 202102012359
Blood Product Expiration Date: 202102022359
ISSUE DATE / TIME: 202101132029
ISSUE DATE / TIME: 202101140015
Unit Type and Rh: 600
Unit Type and Rh: 600

## 2019-03-20 LAB — CYTOLOGY - PAP
Comment: NEGATIVE
High risk HPV: POSITIVE — AB

## 2019-03-21 ENCOUNTER — Telehealth: Payer: Self-pay | Admitting: *Deleted

## 2019-03-21 ENCOUNTER — Encounter: Payer: Self-pay | Admitting: Obstetrics & Gynecology

## 2019-03-21 ENCOUNTER — Other Ambulatory Visit (HOSPITAL_COMMUNITY)
Admission: RE | Admit: 2019-03-21 | Discharge: 2019-03-21 | Disposition: A | Discharge status: Home / Self Care | Payer: Medicaid Other | Source: Ambulatory Visit | Attending: Obstetrics & Gynecology | Admitting: Obstetrics & Gynecology

## 2019-03-21 ENCOUNTER — Other Ambulatory Visit: Payer: Self-pay

## 2019-03-21 ENCOUNTER — Ambulatory Visit (INDEPENDENT_AMBULATORY_CARE_PROVIDER_SITE_OTHER): Payer: Self-pay | Admitting: Obstetrics & Gynecology

## 2019-03-21 ENCOUNTER — Telehealth: Payer: Self-pay | Admitting: Obstetrics & Gynecology

## 2019-03-21 VITALS — BP 210/96 | HR 80 | Temp 98.4°F | Resp 12 | Wt 205.8 lb

## 2019-03-21 DIAGNOSIS — C539 Malignant neoplasm of cervix uteri, unspecified: Secondary | ICD-10-CM

## 2019-03-21 DIAGNOSIS — D500 Iron deficiency anemia secondary to blood loss (chronic): Secondary | ICD-10-CM

## 2019-03-21 NOTE — Progress Notes (Signed)
44 y.o. Single Not Hispanic or Latino female here for colposcopy with possible biopsies and/or ECC due to abnormal Pap obtained 03/16/2019.    Patient's last menstrual period was 03/14/2019 (exact date).          Sexually active: No.  The current method of family planning is abstinence.     Patient has been counseled about results and procedure.  Risks and benefits have bene reviewed including immediate and/or delayed bleeding, infection, cervical scaring from procedure, possibility of needing additional follow up as well as treatment.  rare risks of missing a lesion discussed as well.  All questions answered.  Pt ready to proceed.  BP (!) 210/96 (BP Location: Left Arm, Patient Position: Sitting, Cuff Size: Normal)   Pulse 80   Temp 98.4 F (36.9 C) (Temporal)   Resp 12   Wt 205 lb 12.8 oz (93.4 kg)   LMP 03/14/2019 (Exact Date)   BMI 37.04 kg/m   Physical Exam  Constitutional: She appears well-developed and well-nourished.  Genitourinary:    Vagina normal.  There is no rash, tenderness, lesion or injury on the right labia. There is no rash, tenderness, lesion or injury on the left labia. Uterus is not fixed. Cervix exhibits no friability.       Genitourinary Comments: Uterus is fixed and feels anteflexed.  Entire pelvis is hard, very concerning for parametrial involvement.   Lymphadenopathy:       Right: No inguinal adenopathy present.       Left: No inguinal adenopathy present.  Skin: Skin is warm and dry.  Psychiatric: She has a normal mood and affect.    Speculum placed.  3% acetic acid applied to cervix for >45 seconds.  Cervix visualized with both 7.5X magnification.  Cervix seen much better today than in office last week.  Entire face of cervix is friable and there is a large lesion as noted in picture.  Biopsy at 5 o'clock was obtained with biopsy forceps and brisk, significant bleeding occurred.  Vagina filled quickly with blood which was removed with scopettes and sponge  sticks.  Monsels applied with scopettes did not seem to stop bleeding.  Cautery was brought into room with pressure on biopsy site.  Tampon soaks in monsels was applied prior to cautery and hemostasis was obtained.  This was left in place for several minutes prior to removing.  No additional bleeding was noted.  Monsels in syringe was squirted all over cervix just to ensure complete coverage of friable face of cervix as well.  Pt was monitored for another 45 minutes to ensure no additional bleeding.  There was none.  Pt did tolerate procedure well.  Chaperon, Terence Lux, CMA, was present during procedure.  Assessment:  SCC on pap smear, Cervical lesion also consistent with cervical cancer Fixed uterus consistent with parametrial invovlement  Plan:  Pathology results will be called to patient and follow-up planned pending results.  She is aware I am very concerned about physical exam findings and pathology.  Have communicated with gyn/onc about pt as well.  Pathology needed for definitive diagnosis prior to treatment.  As soon as have biopsy, will make appt with gyn on.

## 2019-03-21 NOTE — Telephone Encounter (Signed)
Call placed to patient to convey benefits for colposcopy. Spoke with patient she understands/agreeable with the benefits. Appointment scheduled 03/21/19.

## 2019-03-21 NOTE — Telephone Encounter (Signed)
Spoke with patient. Advised of all results as seen below per Dr. Sabra Heck. Patient is still on menses, bleeding is light. Completed provera, today is her first day of the aygestin. Explanation of colpo provided, questions answered. Advised patient to take Motrin 800 mg with food and water one hour before procedure. [id number] prescreen negative, precautions reviewed.   Order placed for Colpo for precert. Message to business office.   Colpo with ECC scheduled for today at 3pm with Dr. Sabra Heck.   Routing to provider for final review. Patient is agreeable to disposition. Will close encounter.  Cc: Lerry Liner, Magdalene Patricia

## 2019-03-21 NOTE — Telephone Encounter (Signed)
Burnice Logan, RN  03/21/2019 8:28 AM EST    Left message to call Sharee Pimple, RN at [facility].

## 2019-03-21 NOTE — Telephone Encounter (Signed)
-----   Message from Megan Salon, MD sent at 03/21/2019  8:20 AM EST ----- Please call pt and let her know there are cells worrisome for cancer on the pap smear.  She needs a colposcopy with ECC ASAP.  Spoke to gyn oncology about pt last night as well just to know next step after colposcopy.  Thanks.  I left pt message last night after reviewing DPR about abnormality of pap but didn't specifically say cancer

## 2019-03-21 NOTE — Patient Instructions (Signed)
Please do not do any heavy lifting, pushing pulling or tugging for the next several days.  Do not put anything in the vagina.    Keep taking the iron and eating iron rich foods.  We will call with the biopsy results.

## 2019-03-22 ENCOUNTER — Other Ambulatory Visit: Payer: Self-pay

## 2019-03-22 ENCOUNTER — Encounter: Payer: Self-pay | Admitting: Obstetrics & Gynecology

## 2019-03-22 ENCOUNTER — Emergency Department (HOSPITAL_COMMUNITY)
Admission: EM | Admit: 2019-03-22 | Discharge: 2019-03-23 | Disposition: A | Discharge status: Home / Self Care | Payer: Medicaid Other | Attending: Emergency Medicine | Admitting: Emergency Medicine

## 2019-03-22 ENCOUNTER — Encounter (HOSPITAL_COMMUNITY): Payer: Self-pay | Admitting: Emergency Medicine

## 2019-03-22 ENCOUNTER — Telehealth: Payer: Self-pay | Admitting: Oncology

## 2019-03-22 ENCOUNTER — Ambulatory Visit (INDEPENDENT_AMBULATORY_CARE_PROVIDER_SITE_OTHER): Payer: Self-pay | Admitting: Obstetrics & Gynecology

## 2019-03-22 ENCOUNTER — Telehealth: Payer: Self-pay | Admitting: *Deleted

## 2019-03-22 VITALS — BP 190/100 | HR 114 | Temp 98.2°F | Resp 22 | Ht 62.0 in | Wt 203.5 lb

## 2019-03-22 DIAGNOSIS — Z7982 Long term (current) use of aspirin: Secondary | ICD-10-CM | POA: Diagnosis not present

## 2019-03-22 DIAGNOSIS — Z79899 Other long term (current) drug therapy: Secondary | ICD-10-CM | POA: Insufficient documentation

## 2019-03-22 DIAGNOSIS — N939 Abnormal uterine and vaginal bleeding, unspecified: Secondary | ICD-10-CM | POA: Diagnosis present

## 2019-03-22 DIAGNOSIS — D500 Iron deficiency anemia secondary to blood loss (chronic): Secondary | ICD-10-CM

## 2019-03-22 DIAGNOSIS — D069 Carcinoma in situ of cervix, unspecified: Secondary | ICD-10-CM | POA: Diagnosis not present

## 2019-03-22 DIAGNOSIS — D539 Nutritional anemia, unspecified: Secondary | ICD-10-CM | POA: Insufficient documentation

## 2019-03-22 DIAGNOSIS — I10 Essential (primary) hypertension: Secondary | ICD-10-CM | POA: Diagnosis not present

## 2019-03-22 DIAGNOSIS — C539 Malignant neoplasm of cervix uteri, unspecified: Secondary | ICD-10-CM

## 2019-03-22 LAB — CBC
HCT: 26.3 % — ABNORMAL LOW (ref 36.0–46.0)
Hemoglobin: 7.7 g/dL — ABNORMAL LOW (ref 12.0–15.0)
MCH: 22.9 pg — ABNORMAL LOW (ref 26.0–34.0)
MCHC: 29.3 g/dL — ABNORMAL LOW (ref 30.0–36.0)
MCV: 78.3 fL — ABNORMAL LOW (ref 80.0–100.0)
Platelets: 482 10*3/uL — ABNORMAL HIGH (ref 150–400)
RBC: 3.36 MIL/uL — ABNORMAL LOW (ref 3.87–5.11)
RDW: 18.6 % — ABNORMAL HIGH (ref 11.5–15.5)
WBC: 12.4 10*3/uL — ABNORMAL HIGH (ref 4.0–10.5)
nRBC: 0 % (ref 0.0–0.2)

## 2019-03-22 LAB — I-STAT BETA HCG BLOOD, ED (MC, WL, AP ONLY): I-stat hCG, quantitative: <5 m[IU]/mL (ref <5)

## 2019-03-22 LAB — COMPREHENSIVE METABOLIC PANEL
ALT: 14 U/L (ref 0–44)
AST: 15 U/L (ref 15–41)
Albumin: 3 g/dL — ABNORMAL LOW (ref 3.5–5.0)
Alkaline Phosphatase: 51 U/L (ref 38–126)
Anion gap: 10 (ref 5–15)
BUN: 11 mg/dL (ref 6–20)
CO2: 23 mmol/L (ref 22–32)
Calcium: 8.8 mg/dL — ABNORMAL LOW (ref 8.9–10.3)
Chloride: 107 mmol/L (ref 98–111)
Creatinine, Ser: 1.37 mg/dL — ABNORMAL HIGH (ref 0.44–1.00)
GFR calc Af Amer: 55 mL/min — ABNORMAL LOW (ref >60)
GFR calc non Af Amer: 47 mL/min — ABNORMAL LOW (ref >60)
Glucose, Bld: 157 mg/dL — ABNORMAL HIGH (ref 70–99)
Potassium: 3.8 mmol/L (ref 3.5–5.1)
Sodium: 140 mmol/L (ref 135–145)
Total Bilirubin: 0.4 mg/dL (ref 0.3–1.2)
Total Protein: 6.9 g/dL (ref 6.5–8.1)

## 2019-03-22 LAB — LIPASE, BLOOD: Lipase: 16 U/L (ref 11–51)

## 2019-03-22 LAB — SAMPLE TO BLOOD BANK

## 2019-03-22 MED ORDER — SODIUM CHLORIDE 0.9% FLUSH: 3.0000 mL | Freq: Once | INTRAVENOUS | Status: DC

## 2019-03-22 NOTE — Progress Notes (Signed)
GYNECOLOGY  VISIT  CC:   Vaginal bleeding  HPI: 44 y.o. [id number] Single White or Caucasian female here for follow up.  Had cervical biopsy yesterday for probable cervical cancer and then had SIGNIFICANT bleeding with the biopsy.  This was ultimately controlled with monsels solution.  She was monitored for an extended period of time and the tampon was removed with direct visualization and no bleeding occurred.    She called me this morning about 6:30am stating she had a pad on overnight that had bright red bleeding on it.  She was anxious and wondered if she should go to the ER.  She lives in [place].  As the bleeding wasn't heavy, she was advised to come to the office at 8am.  She reports continued red bleeding but not heavy.  Is not passing clots.  D/w pt probable diagnosis of cervical cancer.  Appt with Dr. Berline Lopes is made for noon tomorrow (gyn/onc).  Pt is aware I suspect parametrial involvements and that she will likely need radiation therapy and probably not surgery.  She is aware she will need additional imaging as well to help with diagnosis.  All questions answered.  Pt, of course, is scared but grateful to know cause and likely treatment course.  GYNECOLOGIC HISTORY: Patient's last menstrual period was 03/14/2019 (exact date). Contraception: BTL Menopausal hormone therapy: none  There are no problems to display for this patient.   Past Medical History:  Diagnosis Date  . Bartholin cyst   . Deviated septum   . Hypertension   . Preeclampsia 2006  . Sleep apnea     Past Surgical History:  Procedure Laterality Date  . CESAREAN SECTION WITH BILATERAL TUBAL LIGATION  11/10/2004      . DILATION AND CURETTAGE OF UTERUS  1997    MEDS:   Current Outpatient Medications on File Prior to Visit  Medication Sig Dispense Refill  . aspirin EC 81 MG tablet Take 81 mg by mouth as needed (if BP is elevated).    . cholecalciferol (VITAMIN D3) 25 MCG (1000 UNIT) tablet Take 1,000 Units by mouth  daily.    . cloNIDine (CATAPRES) 0.1 MG tablet Take 1 tablet (0.1 mg total) by mouth 2 (two) times daily as needed (blood pressure >180/110). 50 tablet 0  . fluticasone (FLONASE) 50 MCG/ACT nasal spray Place 1 spray into both nostrils daily as needed for allergies or rhinitis.    Marland Kitchen norethindrone (AYGESTIN) 5 MG tablet Take 1 tablet (5 mg total) by mouth 2 (two) times daily. 60 tablet 0  . amLODipine (NORVASC) 10 MG tablet Take 1 tablet (10 mg total) by mouth daily. 90 tablet 0  . triamterene-hydrochlorothiazide (DYAZIDE) 37.5-25 MG capsule Take 1 each (1 capsule total) by mouth daily. For 3 days, then as needed for leg swelling. (Patient not taking: Reported on 03/21/2019) 10 capsule 0   No current facility-administered medications on file prior to visit.    ALLERGIES: Penicillins, Shellfish allergy, and Sulfa antibiotics  Family History  Problem Relation Age of Onset  . Brain cancer Mother   . Diabetes Father   . Kidney cancer Father   . Heart failure Sister     SH:  Single, non smoker  Review of Systems  Genitourinary: Positive for vaginal bleeding.  All other systems reviewed and are negative.   PHYSICAL EXAMINATION:    BP (!) 190/100 (BP Location: Right Arm, Patient Position: Sitting, Cuff Size: Large)   Pulse (!) 114   Temp 98.2 F (36.8 C) (  Skin)   Resp (!) 22   Ht 5\' 2"  (1.575 m)   Wt 203 lb 8 oz (92.3 kg)   LMP 03/14/2019 (Exact Date)   BMI 37.22 kg/m     General appearance: alert, cooperative and appears stated age Lymph:  no inguinal LAD noted  Pelvic: External genitalia:  no lesions              Urethra:  normal appearing urethra with no masses, tenderness or lesions              Bartholins and Skenes: normal                 Vagina: normal appearing vagina with normal color and discharge, no lesions              Cervix: no active bleeding from biopsy site, general oozing of blood from multiple locations on cervix, no treatment performed for fear of creating  increasing bleeding   Chaperone, Karmen Bongo, RN, was present for exam.  Assessment: Probably cervical cancer with exam concerning for parametrial involvement Vaginal bleeding Anemia s/p transfusion last week  Plan: Pt is advised to just take it easy and not do any heavy lifting.  Pelvic rest highly encouraged.  Appt with gyn/onc is scheduled for tomorrow.  ~20 minutes spent with patient >50% of time was in face to face discussion of above.

## 2019-03-22 NOTE — Progress Notes (Signed)
GYNECOLOGIC ONCOLOGY NEW PATIENT CONSULTATION   Patient Name: Tamara Osborne  Patient Age: 44 y.o. Date of Service: 03/23/2019 Referring Provider: [facility] [address] [place],  [place] [address]   Primary Care Provider: [facility] Consulting Provider: Jeral Pinch, MD   Assessment/Plan:  44 year old with at least stage IIB squamous cell carcinoma of the cervix.  I spent some time with the patient reviewing her Pap test and biopsy results.  In light of my exam today, despite the biopsy only being able to confirm at least carcinoma in situ, I am quite confident that the patient has at least a locally advanced cervical cancer.  Given the size of the tumor as well as her suspected stage, we discussed that she is not a candidate for surgical treatment and that I recommend primary radiation.  I repeated a biopsy today at the edge of the lesion on her anterior cervix in the hopes that this may confirm an invasive diagnosis.  I spoke with Dr. Sondra Come who is in agreement with beginning treatment given my exam, multiple presentations with heavy vaginal bleeding, and biopsy results thus far.  He was able to meet the patient today during her visit with me and plans to perform her simulation and start radiation this afternoon.  The soonest available appointment for a PET scan, to evaluate for distant disease, was scheduled for next Tuesday.    During her initial presentation today, the patient became quite anxious and endorsed chest pain.  An EKG was obtained initially concerning for an acute MI.  This was repeated several minutes later when the patient was feeling less anxious and denying further chest pain.  Her EKG showed sinus tachycardia with left ventricular hypertrophy, also seen on EKG at her recent ED visit.  If the patient has recurrent heavy vaginal bleeding, we will plan to repack the vagina with quick clot and Curlex.  We are leaving her  Foley catheter in while she is at her appointment with radiation oncology in the event that she needs to have packing placed.  The patient's creatinine over multiple checks in the last week has been between 1.3 and 1.4.  It is a little unclear to me if she has some baseline underlying kidney dysfunction or if her elevated creatinine is related to extension of her tumor.  We will be able to evaluate this further after her CT scan for simulation today as well as her PET scan next week.  Given ongoing constipation, especially now on iron, a prescription for Senokot was sent to the patient's pharmacy.  A copy of this note was sent to the patient's referring provider.   60 minutes of total time was spent for this patient encounter, including preparation, face-to-face counseling with the patient and coordination of care, and documentation of the encounter.  Jeral Pinch, MD  Division of Gynecologic Oncology  Department of Obstetrics and Gynecology  [facility]  ___________________________________________  Chief Complaint: Chief Complaint  Patient presents with  . Squamous cell carcinoma in situ (SCCIS) of cervix  . Abnormal vaginal bleeding    History of Present Illness:  Tamara Osborne is a 45 y.o. y.o. female who is seen in consultation at the request of Dr. Sabra Heck for an evaluation of recent Pap test showing squamous cell carcinoma.  She was seen in urgent care on 1/12 due to increased and abnormal uterine bleeding. She was given Provera. Due to continued anxiety about her bleeding as well  as lightheadedness, palpitations and shortness of breath, she presented to the ED on 1/13 and was found to be tachycardic. Labs were notable for a hemoglobin of 6.1. The patient was transfused 2u of pRBCs.  She saw Dr. Sabra Heck in clinic on 1/14. Pap test revealed SCC, HPV HR positive. She presented again on 1/19 for colposcopy and underwent cervical biopsy. The patient was  monitored in clinic after the biopsy due to bleeding that was ultimately controlled with Monsels and pressure. She was seen yesterday, on 1/20, after calling in the morning with continued bleeding. No active bleeding was noted from the biopsy site on exam, only oozing from multiple locations on the cervix.  Today, the patient endorses having heavy menstrual cycles.  She would often have 2 cycles a month due to stress.  She endorses a longstanding history of cramping at the start of her cycles.  She typically has 2 or 3 days of heavy bleeding and bleeds for a total of 7 days.  On her heaviest days, she uses super pad every 1-2 hours.  With her most recent cycle, her heavy bleeding did not stop after several days.  She noticed increasing weakness, dizziness, and lightheadedness, which is what prompted her to go to the emergency department.  She continues to feel pressure with passage of clots.  She was started on Provera which stopped her bleeding completely.  After being seen for cervical biopsy 2 days ago, she had recurrence of the bleeding yesterday morning when she woke up.  After being seen in clinic, the bleeding improved but increased again at home and she began passing golf ball sized blood clots again, prompting her to present to the emergency department.  She denies any abdominal or pelvic pain or cramping.  She denies any vaginal discharge or odor.  She has some dark brown discharge at the end of her cycles and notes that this had increased in quantity and duration some over the last couple of cycles.  She was last sexually active 6 months ago with her boyfriend and this was the first time she experienced dyspareunia.  She moved to [place] for years ago.  She has not had insurance that time and thinks that her last Pap was 6 years ago.  She endorses a good appetite without nausea or vomiting.  She has occasional flushing.  She denies any urinary symptoms.  She has intermittent constipation  which is controlled with dietary changes.  She has had increased constipation since starting oral iron.  Her last bowel movement was yesterday.  She reports lower extremity edema since her hypertension diagnosis, without any change recently.  She denies any back pain.  PAST MEDICAL HISTORY:  Past Medical History:  Diagnosis Date  . Anxiety   . Bartholin cyst   . Cervical cancer ([place])   . Depression   . Deviated septum   . Hypertension   . Obesity   . Preeclampsia 2006  . Sleep apnea      PAST SURGICAL HISTORY:  Past Surgical History:  Procedure Laterality Date  . CESAREAN SECTION WITH BILATERAL TUBAL LIGATION  11/10/2004      . DILATION AND CURETTAGE OF UTERUS  1997    OB/GYN HISTORY:  OB History  Gravida Para Term Preterm AB Living  6 4 3 1 2 3   SAB TAB Ectopic Multiple Live Births  2       3    # Outcome Date GA Lbr Len/2nd Weight Sex Delivery Anes PTL Lv  6 Preterm 11/10/04 [redacted]w[redacted]d   F CS-LTranv        Complications: Preeclampsia  5 Term 01/12/00 [redacted]w[redacted]d    Vag-Spont   LIV  4 Term 02/23/98    F Vag-Spont   LIV  3 SAB 03/1997 [redacted]w[redacted]d         2 Term 10/26/96 [redacted]w[redacted]d   M Vag-Spont   LIV  1 SAB 1997 [redacted]w[redacted]d       FD    Patient's last menstrual period was 03/14/2019 (exact date).  Age at menarche: 34-13  Age at menopause: n/a Hx of HRT: n/a Hx of STDs: n/a Last pap: 2020 History of abnormal pap smears: yes, patient reports a remote history of abnormal Pap test.  She does not remember undergoing cervical biopsy or procedures.  MEDICATIONS: Outpatient Encounter Medications as of 03/23/2019  Medication Sig  . amLODipine (NORVASC) 10 MG tablet Take 1 tablet (10 mg total) by mouth daily.  Marland Kitchen aspirin EC 81 MG tablet Take 81 mg by mouth as needed (if BP is elevated).  . cholecalciferol (VITAMIN D3) 25 MCG (1000 UNIT) tablet Take 1,000 Units by mouth daily.  . cloNIDine (CATAPRES) 0.1 MG tablet Take 1 tablet (0.1 mg total) by mouth 2 (two) times daily as needed (blood pressure  >180/110).  . fluticasone (FLONASE) 50 MCG/ACT nasal spray Place 1 spray into both nostrils daily as needed for allergies or rhinitis.  Marland Kitchen norethindrone (AYGESTIN) 5 MG tablet Take 1 tablet (5 mg total) by mouth 2 (two) times daily.  Marland Kitchen triamterene-hydrochlorothiazide (DYAZIDE) 37.5-25 MG capsule Take 1 each (1 capsule total) by mouth daily. For 3 days, then as needed for leg swelling. (Patient not taking: Reported on 03/21/2019)   No facility-administered encounter medications on file as of 03/23/2019.    ALLERGIES:  Allergies  Allergen Reactions  . Penicillins     Not sure a child allergy  . Shellfish Allergy Swelling    Seafood also any kind  . Sulfa Antibiotics     Not sure a child allergy     FAMILY HISTORY:  Family History  Problem Relation Age of Onset  . Brain cancer Mother   . Diabetes Father   . Kidney disease Father   . Cancer Father        unsure type  . Heart failure Sister      SOCIAL HISTORY:    Social Connections:   . Frequency of Communication with Friends and Family: Not on file  . Frequency of Social Gatherings with Friends and Family: Not on file  . Attends Religious Services: Not on file  . Active Member of Clubs or Organizations: Not on file  . Attends Archivist Meetings: Not on file  . Marital Status: Not on file    REVIEW OF SYSTEMS:  Pertinent positives as per HPI.  Additionally reports intermittent chest pain and difficulty breathing related to blood pressure and stress/anxiety. Denies appetite changes, fevers, chills, unexplained weight changes. Denies hearing loss, neck lumps or masses, mouth sores, ringing in ears or voice changes. Denies cough or wheezing.   Denies abdominal distention, pain, blood in stools, constipation, diarrhea, nausea, vomiting, or early satiety. Denies dysuria, frequency, hematuria or incontinence. Denies hot flashes, pelvic pain or vaginal discharge.   Denies joint pain, back pain or muscle  pain/cramps. Denies itching, rash, or wounds. Denies headaches, numbness or seizures. Denies swollen lymph nodes or glands, denies easy bruising or bleeding. Denies confusion, or decreased concentration.  Physical Exam:  Vital Signs for  this encounter:  Blood pressure (!) 158/96, pulse (!) 107, temperature 98.7 F (37.1 C), temperature source Temporal, resp. rate 20, height 5\' 2"  (1.575 m), weight 203 lb (92.1 kg), last menstrual period 03/14/2019, SpO2 100 %. Body mass index is 37.13 kg/m. General: Alert, oriented, no acute distress although anxious throughout visit. HEENT: Normocephalic, atraumatic. Sclera anicteric.  Chest: Clear to auscultation bilaterally. No wheezes, rhonchi, or rales. Cardiovascular: Regular rate and rhythm, no murmurs, rubs, or gallops.  Abdomen: Obese. Normoactive bowel sounds. Soft, nondistended, nontender to palpation. No masses or hepatosplenomegaly appreciated. No palpable fluid wave.  Extremities: Grossly normal range of motion. Warm, well perfused. Trace edema bilaterally.  Skin: No rashes or lesions.  Lymphatics: No cervical, supraclavicular, or inguinal adenopathy.  GU:  Normal external female genitalia.  No lesions.              Bladder/urethra:  No lesions or masses, well supported bladder             Vagina/Cervix: Packing removed from the vagina.  Upon placement of speculum, active bleeding noted from the cervix, which was somewhat difficult to see.  Anterior cervix with visible lesion extending almost to the edge of the face of the cervix.  Posterior aspect of the cervix enlarged and unable to visualize entire cervix.  There is friable, hyper vascular, fleshy and fungating tissue along the posterior aspect of the cervix.  Speculum removed and bimanual exam performed.  Cervix almost 8 cm in size, barrel-shaped and nodular, especially posterior aspect.  No obliteration of the fornices and vagina feels uninvolved.  On rectovaginal exam, thickness of bilateral  parametria noted although could not appreciate extension to the sidewall.             Uterus: Enlarged, minimally mobile, exam difficult given body habitus.             Adnexa: no masses.  Rectal: see above  Cervical biopsy procedure: After speculum exam and bimanual exam performed, decision made to proceed with cervical biopsy of the anterior lip in an effort to obtain invasive cancer diagnosis as the patient's last biopsy showed at least carcinoma in situ.  With the patient's verbal consent a biopsy was taken from 11:00 on the face of the cervix.  This was placed in formalin and sent to pathology.   Given ongoing bleeding from the prior cervical biopsy site, Surgiflo, lot [id number], expiration date 06/29/2020 was placed at the top of the vagina and pressure held on the cervix for just over 3 minutes.  No active bleeding was noted and speculum removed from the vagina.  LABORATORY AND RADIOLOGIC DATA:  Outside medical records were reviewed to synthesize the above history, along with the history and physical obtained during the visit.   Lab Results  Component Value Date   WBC 13.0 (H) 03/23/2019   HGB 7.9 (L) 03/23/2019   HCT 25.6 (L) 03/23/2019   PLT 491 (H) 03/23/2019   GLUCOSE 143 (H) 03/23/2019   ALT 14 03/22/2019   AST 15 03/22/2019   NA 142 03/23/2019   K 4.1 03/23/2019   CL 110 03/23/2019   CREATININE 1.35 (H) 03/23/2019   BUN 13 03/23/2019   CO2 19 (L) 03/23/2019   Pap 1/14: SCC, +HR HPV Biopsy 1/20: FINAL MICROSCOPIC DIAGNOSIS:  A. CERVIX, 5 O'CLOCK, BIOPSY:  - Squamous cell carcinoma  - See comment COMMENT: The biopsy is superficial and therefore depth of invasion cannot be  determined. There is at least carcinoma in-situ.

## 2019-03-22 NOTE — Telephone Encounter (Signed)
Spoke with Tamara Osborne at [place], [place]. Patient scheduled for consult with Dr. Berline Lopes on 03/23/19 at 12pm.   Patient notified of appt while in office.   Order placed for referral.   Routing to provider for final review. Patient is agreeable to disposition. Will close encounter.  Cc: Magdalene Patricia

## 2019-03-22 NOTE — Telephone Encounter (Signed)
Requested a rush on cervical biopsy from 03/21/19 with Varney Biles in [facility].

## 2019-03-22 NOTE — ED Triage Notes (Addendum)
Patient reports vaginal bleeding for 2 weeks with hypogastric pain and nausea , patient stated she was diagnosed with cervical cancer yesterday , denies diarrhea or fever . Hypertensive at triage .

## 2019-03-23 ENCOUNTER — Inpatient Hospital Stay: Payer: Medicaid Other

## 2019-03-23 ENCOUNTER — Ambulatory Visit
Admission: RE | Admit: 2019-03-23 | Discharge: 2019-03-23 | Disposition: A | Discharge status: Home / Self Care | Payer: Self-pay | Source: Ambulatory Visit | Attending: Radiation Oncology | Admitting: Radiation Oncology

## 2019-03-23 ENCOUNTER — Ambulatory Visit
Admission: RE | Admit: 2019-03-23 | Discharge: 2019-03-23 | Disposition: A | Discharge status: Home / Self Care | Payer: Medicaid Other | Source: Ambulatory Visit | Attending: Radiation Oncology | Admitting: Radiation Oncology

## 2019-03-23 ENCOUNTER — Other Ambulatory Visit: Payer: Self-pay | Admitting: Oncology

## 2019-03-23 ENCOUNTER — Inpatient Hospital Stay: Payer: Medicaid Other | Attending: Gynecologic Oncology | Admitting: Gynecologic Oncology

## 2019-03-23 ENCOUNTER — Other Ambulatory Visit: Payer: Self-pay | Admitting: Hematology and Oncology

## 2019-03-23 ENCOUNTER — Other Ambulatory Visit: Payer: Self-pay

## 2019-03-23 ENCOUNTER — Encounter: Payer: Self-pay | Admitting: Oncology

## 2019-03-23 ENCOUNTER — Encounter: Payer: Self-pay | Admitting: Gynecologic Oncology

## 2019-03-23 VITALS — BP 158/96 | HR 107 | Temp 98.7°F | Resp 20 | Ht 62.0 in | Wt 203.0 lb

## 2019-03-23 VITALS — BP 157/88 | HR 103 | Temp 97.2°F | Resp 20

## 2019-03-23 DIAGNOSIS — Z7982 Long term (current) use of aspirin: Secondary | ICD-10-CM | POA: Insufficient documentation

## 2019-03-23 DIAGNOSIS — I1310 Hypertensive heart and chronic kidney disease without heart failure, with stage 1 through stage 4 chronic kidney disease, or unspecified chronic kidney disease: Secondary | ICD-10-CM | POA: Diagnosis not present

## 2019-03-23 DIAGNOSIS — F411 Generalized anxiety disorder: Secondary | ICD-10-CM | POA: Insufficient documentation

## 2019-03-23 DIAGNOSIS — F419 Anxiety disorder, unspecified: Secondary | ICD-10-CM | POA: Diagnosis not present

## 2019-03-23 DIAGNOSIS — R8290 Unspecified abnormal findings in urine: Secondary | ICD-10-CM | POA: Diagnosis not present

## 2019-03-23 DIAGNOSIS — N939 Abnormal uterine and vaginal bleeding, unspecified: Secondary | ICD-10-CM | POA: Diagnosis not present

## 2019-03-23 DIAGNOSIS — I517 Cardiomegaly: Secondary | ICD-10-CM | POA: Insufficient documentation

## 2019-03-23 DIAGNOSIS — K5900 Constipation, unspecified: Secondary | ICD-10-CM | POA: Diagnosis not present

## 2019-03-23 DIAGNOSIS — Z793 Long term (current) use of hormonal contraceptives: Secondary | ICD-10-CM | POA: Insufficient documentation

## 2019-03-23 DIAGNOSIS — R079 Chest pain, unspecified: Secondary | ICD-10-CM | POA: Diagnosis not present

## 2019-03-23 DIAGNOSIS — R000 Tachycardia, unspecified: Secondary | ICD-10-CM | POA: Diagnosis not present

## 2019-03-23 DIAGNOSIS — C539 Malignant neoplasm of cervix uteri, unspecified: Secondary | ICD-10-CM | POA: Insufficient documentation

## 2019-03-23 DIAGNOSIS — R600 Localized edema: Secondary | ICD-10-CM | POA: Insufficient documentation

## 2019-03-23 DIAGNOSIS — C801 Malignant (primary) neoplasm, unspecified: Secondary | ICD-10-CM | POA: Insufficient documentation

## 2019-03-23 DIAGNOSIS — Z79899 Other long term (current) drug therapy: Secondary | ICD-10-CM | POA: Diagnosis not present

## 2019-03-23 DIAGNOSIS — D069 Carcinoma in situ of cervix, unspecified: Secondary | ICD-10-CM

## 2019-03-23 DIAGNOSIS — Z510 Encounter for antineoplastic radiation therapy: Secondary | ICD-10-CM | POA: Diagnosis not present

## 2019-03-23 DIAGNOSIS — D500 Iron deficiency anemia secondary to blood loss (chronic): Secondary | ICD-10-CM | POA: Diagnosis not present

## 2019-03-23 DIAGNOSIS — R030 Elevated blood-pressure reading, without diagnosis of hypertension: Secondary | ICD-10-CM | POA: Diagnosis not present

## 2019-03-23 HISTORY — DX: Malignant neoplasm of cervix uteri, unspecified: C53.9

## 2019-03-23 LAB — BASIC METABOLIC PANEL
Anion gap: 13 (ref 5–15)
BUN: 13 mg/dL (ref 6–20)
CO2: 19 mmol/L — ABNORMAL LOW (ref 22–32)
Calcium: 8.7 mg/dL — ABNORMAL LOW (ref 8.9–10.3)
Chloride: 110 mmol/L (ref 98–111)
Creatinine, Ser: 1.35 mg/dL — ABNORMAL HIGH (ref 0.44–1.00)
GFR calc Af Amer: 56 mL/min — ABNORMAL LOW (ref >60)
GFR calc non Af Amer: 48 mL/min — ABNORMAL LOW (ref >60)
Glucose, Bld: 143 mg/dL — ABNORMAL HIGH (ref 70–99)
Potassium: 4.1 mmol/L (ref 3.5–5.1)
Sodium: 142 mmol/L (ref 135–145)

## 2019-03-23 LAB — SURGICAL PATHOLOGY

## 2019-03-23 LAB — CBC
HCT: 25.6 % — ABNORMAL LOW (ref 36.0–46.0)
Hemoglobin: 7.9 g/dL — ABNORMAL LOW (ref 12.0–15.0)
MCH: 23.8 pg — ABNORMAL LOW (ref 26.0–34.0)
MCHC: 30.9 g/dL (ref 30.0–36.0)
MCV: 77.1 fL — ABNORMAL LOW (ref 80.0–100.0)
Platelets: 491 10*3/uL — ABNORMAL HIGH (ref 150–400)
RBC: 3.32 MIL/uL — ABNORMAL LOW (ref 3.87–5.11)
RDW: 18.6 % — ABNORMAL HIGH (ref 11.5–15.5)
WBC: 13 10*3/uL — ABNORMAL HIGH (ref 4.0–10.5)
nRBC: 0.2 % (ref 0.0–0.2)

## 2019-03-23 LAB — URINALYSIS, COMPLETE (UACMP) WITH MICROSCOPIC
Bilirubin Urine: NEGATIVE
Glucose, UA: NEGATIVE mg/dL
Ketones, ur: NEGATIVE mg/dL
Leukocytes,Ua: NEGATIVE
Nitrite: NEGATIVE
Protein, ur: >=300 mg/dL — AB
Specific Gravity, Urine: 1.026 (ref 1.005–1.030)
pH: 5 (ref 5.0–8.0)

## 2019-03-23 NOTE — Progress Notes (Signed)
START OFF PATHWAY REGIMEN - Other   [id number]:Cisplatin 35 mg/m2 q7 Days + RT:   A cycle is every 7 days:     Cisplatin   **Always confirm dose/schedule in your pharmacy ordering system**  Patient Characteristics: Intent of Therapy: Curative Intent, Discussed with Patient 

## 2019-03-23 NOTE — Discharge Instructions (Signed)
Follow-up with GYN oncology as scheduled today at noon.  They will give you further treatment instructions. Return here for any new concerns.

## 2019-03-23 NOTE — ED Provider Notes (Addendum)
[facility] EMERGENCY DEPARTMENT Provider Note   CSN: [id number] Arrival date & time: 03/22/19  2111     History Chief Complaint  Patient presents with  . Vaginal Bleeding/Cervical Cancer    Tamara Osborne is a 44 y.o. female.  The history is provided by the patient and medical records.    43 year old female with history of hypertension, sleep apnea, recent diagnosis of likely cervical cancer status post biopsy on 03/21/2019, presenting to the ED with vaginal bleeding.  States after her biopsy she had a large amount of bleeding, this seems to have resolved with Monsel's.  She went back to the clinic yesterday for recheck, no active bleeding.  His last night she was resting and when got up and moved around she noticed some bright red bleeding again which concerned her.  States she is currently on medication to stop her from having menstrual cycles, is not sure if she is bleeding from the site of biopsy, from the cancer itself, or intrauterine source.  She has appointment with GYN oncology today at noon.  She denies any dizziness, lightheadedness, or feelings of syncope.  She is not had any chest pain or shortness of breath.  Did have transfusion last week for heavy bleeding, now on Fe+ supplements and may start Fe+ infusions per patient.  Past Medical History:  Diagnosis Date  . Bartholin cyst   . Cancer ([place])   . Deviated septum   . Hypertension   . Preeclampsia 2006  . Sleep apnea     Patient Active Problem List   Diagnosis Date Noted  . Anemia 03/22/2019  . Squamous cell carcinoma in situ (SCCIS) of cervix 03/22/2019    Past Surgical History:  Procedure Laterality Date  . CESAREAN SECTION WITH BILATERAL TUBAL LIGATION  11/10/2004      . DILATION AND CURETTAGE OF UTERUS  1997     OB History    Gravida  6   Para  4   Term  3   Preterm  1   AB  2   Living  3     SAB  2   TAB      Ectopic      Multiple      Live Births  3            Family History  Problem Relation Age of Onset  . Brain cancer Mother   . Diabetes Father   . Kidney cancer Father   . Heart failure Sister     Social History   Tobacco Use  . Smoking status: Never Smoker  . Smokeless tobacco: Never Used  Substance Use Topics  . Alcohol use: No  . Drug use: No    Home Medications Prior to Admission medications   Medication Sig Start Date End Date Taking? Authorizing Provider  amLODipine (NORVASC) 10 MG tablet Take 1 tablet (10 mg total) by mouth daily. 06/28/16 03/21/19  Wynona Luna, MD  aspirin EC 81 MG tablet Take 81 mg by mouth as needed (if BP is elevated).    [provider]  cholecalciferol (VITAMIN D3) 25 MCG (1000 UNIT) tablet Take 1,000 Units by mouth daily.    [provider]  cloNIDine (CATAPRES) 0.1 MG tablet Take 1 tablet (0.1 mg total) by mouth 2 (two) times daily as needed (blood pressure >180/110). 06/28/16   Wynona Luna, MD  fluticasone [facility]) 50 MCG/ACT nasal spray Place 1 spray into both nostrils daily as needed for allergies  or rhinitis.    [provider]  norethindrone (AYGESTIN) 5 MG tablet Take 1 tablet (5 mg total) by mouth 2 (two) times daily. 03/16/19   Megan Salon, MD  triamterene-hydrochlorothiazide (DYAZIDE) 37.5-25 MG capsule Take 1 each (1 capsule total) by mouth daily. For 3 days, then as needed for leg swelling. Patient not taking: Reported on 03/21/2019 06/28/16 07/08/16  Wynona Luna, MD    Allergies    Penicillins, Shellfish allergy, and Sulfa antibiotics  Review of Systems   Review of Systems  Genitourinary: Positive for vaginal bleeding.  All other systems reviewed and are negative.   Physical Exam Updated Vital Signs BP (!) 169/90 (BP Location: Left Arm)   Pulse (!) 105   Temp 98.4 F (36.9 C) (Oral)   Resp 16   LMP 03/14/2019 (Exact Date)   SpO2 99%   Physical Exam Vitals and nursing note reviewed. Exam conducted with a chaperone  present.  Constitutional:      Appearance: She is well-developed.  HENT:     Head: Normocephalic and atraumatic.  Eyes:     Conjunctiva/sclera: Conjunctivae normal.     Pupils: Pupils are equal, round, and reactive to light.  Cardiovascular:     Rate and Rhythm: Normal rate and regular rhythm.     Heart sounds: Normal heart sounds.  Pulmonary:     Effort: Pulmonary effort is normal.     Breath sounds: Normal breath sounds.  Abdominal:     General: Bowel sounds are normal.     Palpations: Abdomen is soft.     Comments: Obese abdomen, no peritoneal signs  Genitourinary:    Comments: Normal female external genitalia, cervical lesion noted, there is some mild oozing of blood but appears to be more of a uterine source and not the cervical lesion itself, there is some black, clotting appearing material in the cervical os consistent with monsels solution Musculoskeletal:        General: Normal range of motion.     Cervical back: Normal range of motion.  Skin:    General: Skin is warm and dry.  Neurological:     Mental Status: She is alert and oriented to person, place, and time.     ED Results / Procedures / Treatments   Labs (all labs ordered are listed, but only abnormal results are displayed) Labs Reviewed  COMPREHENSIVE METABOLIC PANEL - Abnormal; Notable for the following components:      Result Value   Glucose, Bld 157 (*)    Creatinine, Ser 1.37 (*)    Calcium 8.8 (*)    Albumin 3.0 (*)    GFR calc non Af Amer 47 (*)    GFR calc Af Amer 55 (*)    All other components within normal limits  CBC - Abnormal; Notable for the following components:   WBC 12.4 (*)    RBC 3.36 (*)    Hemoglobin 7.7 (*)    HCT 26.3 (*)    MCV 78.3 (*)    MCH 22.9 (*)    MCHC 29.3 (*)    RDW 18.6 (*)    Platelets 482 (*)    All other components within normal limits  LIPASE, BLOOD  URINALYSIS, ROUTINE W REFLEX MICROSCOPIC  I-STAT BETA HCG BLOOD, ED ([facility], [facility], [facility] ONLY)  SAMPLE TO BLOOD BANK     EKG None  Radiology No results found.  Procedures Procedures (including critical care time)  Medications Ordered in ED Medications  sodium chloride flush (  NS) 0.9 % injection 3 mL (has no administration in time range)    ED Course  I have reviewed the triage vital signs and the nursing notes.  Pertinent labs & imaging results that were available during my care of the patient were reviewed by me and considered in my medical decision making (see chart for details).    MDM Rules/Calculators/A&P  44 year old female presenting to the ED with vaginal bleeding.  Recently diagnosed with cervical cancer via biopsy on 03/20/2018.  To have follow-up in clinic yesterday with cessation of bleeding but had episode again last evening and became concerned.  She is afebrile and nontoxic in appearance.  She is very anxious and nervous appearing on exam.  Labs are overall reassuring, hemoglobin is stable at 7.7, improvement from last on record of 6.1.  Pelvic exam performed just for visualization purposes, she has mild oozing of blood but this appears to be intrauterine source and not from the cervical lesions/biopsy site itself.  There is some black clotted material present which is consistent with monsels solution.  Patient remains hemodynamically stable here in the ED.  I had a discussion with her about expectations from the ER, limited intervention as she already has speciality follow-up in place.  I do not feel she requires further work-up or transfusion in the ED right now.  Feel she is stable for discharge home to follow-up with GYN oncology today as scheduled, continue Fe+ supplements.  Return here for any new or acute changes.  Final Clinical Impression(s) / ED Diagnoses Final diagnoses:  Vaginal bleeding    Rx / DC Orders ED Discharge Orders    None       Larene Pickett, PA-C 03/23/19 0430    Larene Pickett, PA-C 03/23/19 0450    Orpah Greek, MD 03/23/19 [phone]

## 2019-03-23 NOTE — Patient Instructions (Signed)
Your PET scan to evaluate for spread of cancer is at [facility] on April 28, 2019 at 11 am. Nothing to eat or drink six hours before. Directions to the [facility] is to turn into the hospital, stay straight, go over 4 speed bumps, and the medical mall will be on the right with a statue. There are signs as well to direct you to the [facility] near the hospital.

## 2019-03-23 NOTE — Progress Notes (Signed)
Met with Tamara Osborne during her appointment with Dr. Berline Lopes.  Briefly discussed what to expect with radiation. Provided her with my business care and encouraged her to call with any questions.

## 2019-03-23 NOTE — Progress Notes (Signed)
  Radiation Oncology         [phone] ________________________________  Name: Tamara Osborne MRN: [id number]  Date: 03/23/2019  DOB: 20-Sep-1975  Simulation Verification Note    ICD-10-CM   1. Squamous cell carcinoma of cervix (HCC)  C53.9     Status: outpatient  NARRATIVE: The patient was brought to the treatment unit and placed in the planned treatment position. The clinical setup was verified. Then port films were obtained and uploaded to the radiation oncology medical record software.  The treatment beams were carefully compared against the planned radiation fields. The position location and shape of the radiation fields was reviewed. They targeted volume of tissue appears to be appropriately covered by the radiation beams. Organs at risk appear to be excluded as planned.  Based on my personal review, I approved the simulation verification. The patient's treatment will proceed as planned.  -----------------------------------  Blair Promise, PhD, MD

## 2019-03-23 NOTE — Progress Notes (Signed)
Patient arrived to the clinic at 8 am with her scheduled appointment at 12 pm.  She states she went to the ED last pm for vaginal bleeding with no intervention and was told to come to the office first thing in the am.  Patient experiencing significant vaginal bleeding.  States the bleeding has been heavy for the past two weeks.  She has had golf ball size clots from the vagina.  She has been experiencing chest discomfort and feels it is related to her anxiety.  She has not taken her BP medication this am and she states she can tell her BP is high because her vision is blurry at times.   Patient assisted to an exam room.  Lab to the room to take CBC and Bmet.  Patient states that she feels she is most likely dehydrated and has not eaten but she was afraid to bear down to have a BM since that increased her bleeding.  After labs obtained, pelvic examination performed.  Moderate amount of bright red blood noted in the vagina.  Area of active bleeding noted on the cervix. Monsels applied with pressure but bleeding persisted.  Discussed with patient prior to the exam that vaginal packing may be needed as an attempt to stop the bleeding and the role of foley catheter insertion when the packing is inserted. With the patient's permission, the vaginal was packed with sterile kerlix without difficulty and foley catheter inserted into the bladder at 9:30am with cloudy urine out.  Patient tolerated well.  Due to persistent chest discomfort, an EKG was obtained as well.  1st print out EKG resulting sinus tachycardia, LVH with repolarization abnormality, and ST elevation.  Repeat print out of tracing after patient less anxious and more relaxed two minutes later resulting sinus tach, LVH similar to recent EKG from ED on 04/02/2019. Results reviewed with Dr. Nida Boatman intervention at this time.  Labs reviewed with the patient-hgb stable at 7.9 from 7.7 last pm. Patient observed until seen by Dr. Berline Lopes.  In no acute distress but  anxious.

## 2019-03-23 NOTE — Progress Notes (Signed)
Oswald Hillock of appointments for lab at 9:45 on 03/28/19 and new patient appointment with Dr. Alvy Bimler at 10:00 on 03/29/19 with patient education class to follow.    Educated her on ports and advised her she will be getting a call to schedule to have a port placed.    Advised her that the financial counselors for radiation and medical oncology will be contacting her about assistance and that we will refer her to our transportation program.

## 2019-03-23 NOTE — Progress Notes (Signed)
Patient back from treatment walked around to see if she would have any bleeding with ambulation. None noted foley was removed with Dr Sondra Come present minimal bleeding noted dressed and discharged via wheel chair with her family.

## 2019-03-23 NOTE — Progress Notes (Signed)
Radiation Oncology         [phone] ________________________________  Initial Outpatient Consultation  Name: Tamara Osborne MRN: [id number]  Date: 03/23/2019  DOB: 1975/04/03  JO:ACZY, [facility]  [name], MD   REFERRING PHYSICIAN: [name], MD  DIAGNOSIS: The encounter diagnosis was Squamous cell carcinoma of cervix ([place]).  Stage II-B squamous cell carcinoma of the cervix  (pending upcoming PET scan)  HISTORY OF PRESENT ILLNESS::Tamara Osborne is a 44 y.o. female who is seen at the courtesy of Dr. Berline Lopes for consideration for urgent radiation therapy what appears to be at least locally advanced cervical cancer.  The patient presented to urgent care on 03/13/2018 with metrorrhagia. She proceeded to the ED the following day with hypertension and anemia due to blood loss. She reported heavier bleeding than with her usual menstrual cycles. She was transfused with two units of blood and sent home. She was referred to Dr. Sabra Heck on 03/16/2019. On physical exam, she was noted to have an enlarged uterus versus a pelvic mass. Pelvic ultrasound performed at that time showed two 2.5 cm fibroids. Pap smear performed that day revealed HR HPV positivity and squamous cell carcinoma.   The patient returned for biopsy on 03/21/2019 of the large cervical lesion, which caused significant bleeding. Pathology from the procedure confirmed squamous cell carcinoma, at least in situ. Her bleeding was stopped.  She had a recurrence of her bleeding yesterday, 03/22/2019, morning and was seen by Dr. Sabra Heck. The bleeding improved for a while but increased to golf-ball-sized blood clots again, prompting her to present to the ED again.   She presented to Dr. Charisse March office first thing this morning, anxious regarding her bleeding. Tamara John, NP met with the patient first and packed the vagina, as well as placed a foley catheter. Dr. Berline Lopes wrote this regarding the physical  exam, "Packing removed from the vagina.  Upon placement of speculum, active bleeding noted from the cervix, which was somewhat difficult to see.  Anterior cervix with visible lesion extending almost to the edge of the face of the cervix.  Posterior aspect of the cervix enlarged and unable to visualize entire cervix.  There is friable, hyper vascular, fleshy and fungating tissue along the posterior aspect of the cervix.  Speculum removed and bimanual exam performed.  Cervix almost 8 cm in size, barrel-shaped and nodular, especially posterior aspect.  No obliteration of the fornices and vagina feels uninvolved.  On rectovaginal exam, thickness of bilateral parametria noted although could not appreciate extension to the sidewall."   Repeat biopsy of the cervix was obtained in the hopes of confirming an invasive diagnosis.   PREVIOUS RADIATION THERAPY: No  PAST MEDICAL HISTORY:  Past Medical History:  Diagnosis Date  . Anxiety   . Bartholin cyst   . Cervical cancer ([place])   . Depression   . Deviated septum   . Hypertension   . Obesity   . Preeclampsia 2006  . Sleep apnea     PAST SURGICAL HISTORY: Past Surgical History:  Procedure Laterality Date  . CESAREAN SECTION WITH BILATERAL TUBAL LIGATION  11/10/2004      . DILATION AND CURETTAGE OF UTERUS  1997    FAMILY HISTORY:  Family History  Problem Relation Age of Onset  . Brain cancer Mother   . Diabetes Father   . Kidney disease Father   . Cancer Father        unsure type  . Heart failure Sister  SOCIAL HISTORY:  Social History   Tobacco Use  . Smoking status: Never Smoker  . Smokeless tobacco: Never Used  Substance Use Topics  . Alcohol use: No  . Drug use: No    ALLERGIES:  Allergies  Allergen Reactions  . Penicillins     Not sure a child allergy  . Shellfish Allergy Swelling    Seafood also any kind  . Sulfa Antibiotics     Not sure a child allergy    MEDICATIONS:  Current Outpatient Medications   Medication Sig Dispense Refill  . amLODipine (NORVASC) 10 MG tablet Take 1 tablet (10 mg total) by mouth daily. 90 tablet 0  . aspirin EC 81 MG tablet Take 81 mg by mouth as needed (if BP is elevated).    . cholecalciferol (VITAMIN D3) 25 MCG (1000 UNIT) tablet Take 1,000 Units by mouth daily.    . cloNIDine (CATAPRES) 0.1 MG tablet Take 1 tablet (0.1 mg total) by mouth 2 (two) times daily as needed (blood pressure >180/110). 50 tablet 0  . fluticasone (FLONASE) 50 MCG/ACT nasal spray Place 1 spray into both nostrils daily as needed for allergies or rhinitis.    . hydrOXYzine (ATARAX/VISTARIL) 25 MG tablet Take 1 tablet (25 mg total) by mouth every 6 (six) hours as needed for anxiety. 20 tablet 0  . norethindrone (AYGESTIN) 5 MG tablet Take 1 tablet (5 mg total) by mouth 2 (two) times daily. 60 tablet 0  . senna-docusate (SENOKOT-S) 8.6-50 MG tablet Take 2 tablets by mouth at bedtime. To prevent constipation, do not take if having loose stools 60 tablet 1  . triamterene-hydrochlorothiazide (DYAZIDE) 37.5-25 MG capsule Take 1 each (1 capsule total) by mouth daily. For 3 days, then as needed for leg swelling. (Patient not taking: Reported on 03/21/2019) 10 capsule 0   No current facility-administered medications for this encounter.    REVIEW OF SYSTEMS:  A 10+ POINT REVIEW OF SYSTEMS WAS OBTAINED including neurology, dermatology, psychiatry, cardiac, respiratory, lymph, extremities, GI, GU, musculoskeletal, constitutional, reproductive, HEENT.  History of significant vaginal bleeding as above.  She denies any low back pain or pelvic pain.  She denies any abdominal bloating.   PHYSICAL EXAM:  Vitals - 1 value per visit 04/09/221  SYSTOLIC 361  DIASTOLIC 97  Pulse 99  Temperature 98.4  Respirations 14  Weight (lb)   Height   BMI   VISIT REPORT   General: Alert and oriented, in no acute distress, somewhat anxious, appreciative of urgent workin HEENT: Head is normocephalic. Extraocular  movements are intact.  Neck: Neck is supple, no palpable cervical or supraclavicular lymphadenopathy. Heart: Regular in rate and rhythm with no murmurs, rubs, or gallops. Chest: Clear to auscultation bilaterally, with no rhonchi, wheezes, or rales. Abdomen: Soft, nontender, nondistended, with no rigidity or guarding. Extremities: No cyanosis or edema. Lymphatics: see Neck Exam Skin: No concerning lesions. Musculoskeletal: symmetric strength and muscle tone throughout. Neurologic: Cranial nerves II through XII are grossly intact. No obvious focalities. Speech is fluent. Coordination is intact. Psychiatric: Judgment and insight are intact. Affect is appropriate. Pelvic exam deferred in light of recent problems with significant bleeding and well-documented exam by Dr. Berline Lopes as above.  ECOG = 1    LABORATORY DATA:  Lab Results  Component Value Date   WBC 17.8 (H) 03/25/2019   HGB 6.9 (LL) 03/25/2019   HCT 22.7 (L) 03/25/2019   MCV 77.5 (L) 03/25/2019   PLT 437 (H) 03/25/2019   NEUTROABS 15.4 (H) 03/25/2019  Lab Results  Component Value Date   NA 135 03/25/2019   K 3.7 03/25/2019   CL 101 03/25/2019   CO2 23 03/25/2019   GLUCOSE 106 (H) 03/25/2019   CREATININE 1.35 (H) 03/25/2019   CALCIUM 8.8 (L) 03/25/2019      RADIOGRAPHY: US Transvaginal Non-OB  Result Date: 03/16/2019 SEE PROGRESS NOTE     IMPRESSION: Stage II-B squamous cell carcinoma of the cervix  (pending upcoming PET scan)  Patient will be a good candidate for urgent radiation therapy directed to the pelvis in an attempt to control her significant vaginal bleeding.  I discussed with patient that given her advanced clinical stage she would be not a candidate for surgery, we will proceed for definitive treatment pending PET scan shows no distant metastasis.  Definitive/curative treatment would include pelvic radiation therapy, radiosensitizing chemotherapy and intracavitary brachytherapy treatments.  The patient  lives in [place] but would prefer to have all of her treatment here in [place].  I discussed the general course of treatment side effects and potential toxicities of high-dose radiation therapy directed to the pelvis with patient.  She appears to understand and wishes to proceed with planned course of treatment.   PLAN: Patient will proceed to CT simulation this morning and receive her first radiation therapy directed to the pelvis today.  She will see additional treatment January 22 and if necessary additional treatments over the weekend if she continues to have significant vaginal bleeding.  Radiation portals could change depending on the results of upcoming PET scan January 22.  Medical oncology evaluation in the near future.   Addendum: Biopsy does confirm invasive squamous cell carcinoma ------------------------------------------------  Blair Promise, PhD, MD  This document serves as a record of services personally performed by Gery Pray, MD. It was created on his behalf by Wilburn Mylar, a trained medical scribe. The creation of this record is based on the scribe's personal observations and the provider's statements to them. This document has been checked and approved by the attending provider.

## 2019-03-23 NOTE — Progress Notes (Signed)
  Radiation Oncology         ([phone] ________________________________  Name: Tamara Osborne MRN: [id number]  Date: 03/23/2019  DOB: 10-15-1975  SIMULATION AND TREATMENT PLANNING NOTE    ICD-10-CM   1. Squamous cell carcinoma of cervix ([facility])  C53.9     DIAGNOSIS:  Squamous Cell Carcinoma  of the Cervix, at least stage II-b,  (PET scan pending)  NARRATIVE:  The patient was brought to the [facility].  Identity was confirmed.  All relevant records and images related to the planned course of therapy were reviewed.  The patient freely provided informed written consent to proceed with treatment after reviewing the details related to the planned course of therapy. The consent form was witnessed and verified by the simulation staff.  Then, the patient was set-up in a stable reproducible  supine position for radiation therapy.  CT images were obtained.  Surface markings were placed.  The CT images were loaded into the planning software.  Then the target and avoidance structures were contoured.  Treatment planning then occurred.  The radiation prescription was entered and confirmed.  Then, I designed and supervised the construction of a total of 5 medically necessary complex treatment devices.  I have requested : 3D Simulation  I have requested a DVH of the following structures: Cervix, uterus, bladder, rectum, small bowel, rectosigmoid colon.  I have ordered:dose calc.  PLAN:  The patient will receive 45 Gy in 25 fractions directed at the pelvis area.  This will be followed by parametrial/sidewall boost of 9 Gy in  5 fractions.  Patient will then proceed with intracavitary brachytherapy treatments using iridium 192 as the high-dose-rate source.  She will also receive radiosensitizing chemotherapy during her external beam radiation treatments.  Overall treatment plan plans could change depending on results of upcoming PET scan early next  week.  -----------------------------------  Blair Promise, PhD, MD  This document serves as a record of services personally performed by Gery Pray, MD. It was created on his behalf by Wilburn Mylar, a trained medical scribe. The creation of this record is based on the scribe's personal observations and the provider's statements to them. This document has been checked and approved by the attending provider.

## 2019-03-24 ENCOUNTER — Other Ambulatory Visit: Payer: Self-pay | Admitting: Hematology and Oncology

## 2019-03-24 ENCOUNTER — Ambulatory Visit
Admission: RE | Admit: 2019-03-24 | Discharge: 2019-03-24 | Disposition: A | Discharge status: Home / Self Care | Payer: Medicaid Other | Source: Ambulatory Visit | Attending: Radiation Oncology | Admitting: Radiation Oncology

## 2019-03-24 ENCOUNTER — Other Ambulatory Visit: Payer: Self-pay | Admitting: Gynecologic Oncology

## 2019-03-24 ENCOUNTER — Encounter: Payer: Self-pay | Admitting: *Deleted

## 2019-03-24 ENCOUNTER — Other Ambulatory Visit: Payer: Self-pay | Admitting: Radiology

## 2019-03-24 ENCOUNTER — Other Ambulatory Visit: Payer: Self-pay

## 2019-03-24 ENCOUNTER — Encounter: Payer: Self-pay | Admitting: Oncology

## 2019-03-24 DIAGNOSIS — C539 Malignant neoplasm of cervix uteri, unspecified: Secondary | ICD-10-CM

## 2019-03-24 DIAGNOSIS — Z510 Encounter for antineoplastic radiation therapy: Secondary | ICD-10-CM | POA: Diagnosis not present

## 2019-03-24 LAB — SURGICAL PATHOLOGY

## 2019-03-24 MED ORDER — SENNOSIDES-DOCUSATE SODIUM 8.6-50 MG PO TABS: 2.0000 | ORAL_TABLET | Freq: Every day | ORAL | 1 refills | Status: DC

## 2019-03-24 NOTE — Progress Notes (Signed)
[place] Work  Clinical Social Work was referred by transportation department.  Transportation Lyft services are not available where patient resides and is not a viable option for transportation.  CSW informed patient and discussed other options.  Ms. Hindmon plans to ask a neighbor to help with transportation.  CSW can provide gas card assistance and encouraged patient to follow up with CSW once her plan is determined.  Gwinda Maine, LCSW  Clinical Social Worker [facility]

## 2019-03-24 NOTE — Progress Notes (Signed)
Oswald Hillock of appointment change for patient education.  It has been rescheduled to 03/30/19 at 10 am after radiation.  Also discussed that a time for infusion has been scheduled after her visit with Dr. Alvy Bimler on 03/29/19.  Provided her with a new calender.  Also discussed that she has been referred to the transportation program starting on 03/29/19 and that they will be calling her with instructions.

## 2019-03-25 ENCOUNTER — Other Ambulatory Visit: Payer: Self-pay

## 2019-03-25 ENCOUNTER — Emergency Department (HOSPITAL_COMMUNITY)
Admission: EM | Admit: 2019-03-25 | Discharge: 2019-03-25 | Disposition: A | Discharge status: Home / Self Care | Payer: Medicaid Other | Attending: Emergency Medicine | Admitting: Emergency Medicine

## 2019-03-25 ENCOUNTER — Encounter (HOSPITAL_COMMUNITY): Payer: Self-pay

## 2019-03-25 DIAGNOSIS — D649 Anemia, unspecified: Secondary | ICD-10-CM | POA: Insufficient documentation

## 2019-03-25 DIAGNOSIS — I10 Essential (primary) hypertension: Secondary | ICD-10-CM | POA: Diagnosis not present

## 2019-03-25 DIAGNOSIS — D069 Carcinoma in situ of cervix, unspecified: Secondary | ICD-10-CM | POA: Insufficient documentation

## 2019-03-25 DIAGNOSIS — R202 Paresthesia of skin: Secondary | ICD-10-CM | POA: Diagnosis not present

## 2019-03-25 DIAGNOSIS — Z79899 Other long term (current) drug therapy: Secondary | ICD-10-CM | POA: Insufficient documentation

## 2019-03-25 LAB — POC URINE PREG, ED: Preg Test, Ur: NEGATIVE

## 2019-03-25 LAB — CBC WITH DIFFERENTIAL/PLATELET
Abs Immature Granulocytes: 0.09 10*3/uL — ABNORMAL HIGH (ref 0.00–0.07)
Basophils Absolute: 0.1 10*3/uL (ref 0.0–0.1)
Basophils Relative: 0 %
Eosinophils Absolute: 0.2 10*3/uL (ref 0.0–0.5)
Eosinophils Relative: 1 %
HCT: 22.7 % — ABNORMAL LOW (ref 36.0–46.0)
Hemoglobin: 6.9 g/dL — CL (ref 12.0–15.0)
Immature Granulocytes: 1 %
Lymphocytes Relative: 7 %
Lymphs Abs: 1.2 10*3/uL (ref 0.7–4.0)
MCH: 23.5 pg — ABNORMAL LOW (ref 26.0–34.0)
MCHC: 30.4 g/dL (ref 30.0–36.0)
MCV: 77.5 fL — ABNORMAL LOW (ref 80.0–100.0)
Monocytes Absolute: 1 10*3/uL (ref 0.1–1.0)
Monocytes Relative: 5 %
Neutro Abs: 15.4 10*3/uL — ABNORMAL HIGH (ref 1.7–7.7)
Neutrophils Relative %: 86 %
Platelets: 437 10*3/uL — ABNORMAL HIGH (ref 150–400)
RBC: 2.93 MIL/uL — ABNORMAL LOW (ref 3.87–5.11)
RDW: 18.6 % — ABNORMAL HIGH (ref 11.5–15.5)
WBC: 17.8 10*3/uL — ABNORMAL HIGH (ref 4.0–10.5)
nRBC: 0.2 % (ref 0.0–0.2)

## 2019-03-25 LAB — BASIC METABOLIC PANEL
Anion gap: 11 (ref 5–15)
BUN: 12 mg/dL (ref 6–20)
CO2: 23 mmol/L (ref 22–32)
Calcium: 8.8 mg/dL — ABNORMAL LOW (ref 8.9–10.3)
Chloride: 101 mmol/L (ref 98–111)
Creatinine, Ser: 1.35 mg/dL — ABNORMAL HIGH (ref 0.44–1.00)
GFR calc Af Amer: 56 mL/min — ABNORMAL LOW (ref >60)
GFR calc non Af Amer: 48 mL/min — ABNORMAL LOW (ref >60)
Glucose, Bld: 106 mg/dL — ABNORMAL HIGH (ref 70–99)
Potassium: 3.7 mmol/L (ref 3.5–5.1)
Sodium: 135 mmol/L (ref 135–145)

## 2019-03-25 MED ORDER — LORAZEPAM 2 MG/ML IJ SOLN
1.0000 mg | Freq: Once | INTRAMUSCULAR | Status: DC
  Filled 2019-03-25: qty 1

## 2019-03-25 MED ORDER — SODIUM CHLORIDE 0.9 % IV BOLUS: 1000.0000 mL | Freq: Once | INTRAVENOUS | Status: DC

## 2019-03-25 MED ORDER — HYDROXYZINE HCL 25 MG PO TABS: 25.0000 mg | ORAL_TABLET | Freq: Four times a day (QID) | ORAL | 0 refills | Status: DC | PRN

## 2019-03-25 NOTE — ED Provider Notes (Signed)
Care assumed from Domenic Moras, PA-C at shift change with labs pending.   In brief, this patient is a 44 year old female with past medical story anxiety, depression hypertension who recently diagnosed cervical cancer who presents for evaluation tingling sensation to the fingertips to her bilateral hands.  She states is been ongoing for the last 2 days.  She does report that she has been increasingly anxious about her recent diagnosis.  Please see note from previous provider for full history/physical exam.   Physical Exam  BP (!) 153/82 (BP Location: Left Arm)   Pulse (!) 108   Temp 98.6 F (37 C) (Oral)   Resp 18   Ht 5\' 2"  (1.575 m)   Wt 93 kg   LMP 03/14/2019 (Exact Date)   SpO2 100%   BMI 37.49 kg/m   Physical Exam  2+ radial pulses bilaterally. Full grip strength bilaterally 5/5 strength of bilateral upper extremities.  ED Course/Procedures     Procedures  MDM   PLAN: Patient pending labs.  If labs unremarkable, discharged home.  Her CBC did show slight cytosis.  It looks like she has had some leukocytosis in the past.  Additionally, her hemoglobin is 6.9.  She had a hemoglobin done a few days ago that was 7.9.  Patient declined transfusion here in the emergency department.  Additionally, she declined any fluids here in the emergency department.  Lecture lites are unremarkable, plan to discharge home.  MDM:  BMP shows BUN of 12, creatinine 1.35.  I reviewed her blood work and this seems to be consistent with previous.  No other acute abnormalities.  I discussed with patient regarding her results.  Previous provider had discussed with her regarding her hemoglobin.  I did to discuss with her regarding her hemoglobin of 6.9.  I offered her transfusion here in the ED as well as some fluids for her tachycardia.  Patient does not wish to have an IV started today and does not wish to have fluids or transfusion here in the ED.  She has an appoint with oncology on Monday where she is  scheduled to get a port.  I instructed her to tell them about the anemia she might need a transfusion on Monday.  Again additionally, I discussed with patient regarding signs/symptoms to look out for for symptomatic anemia and to return emergently to the emergency department if she has any of those symptoms. Patient had ample opportunity for questions and discussion. All patient's questions were answered with full understanding. Strict return precautions discussed. Patient expresses understanding and agreement to plan.    1. Paresthesia   2. Anemia, unspecified type    Portions of this note were generated with [employer]. Dictation errors may occur despite best attempts at proofreading.     Volanda Napoleon, PA-C 03/25/19 [id number]    Malvin Johns, MD 03/25/19 2342

## 2019-03-25 NOTE — ED Provider Notes (Signed)
[facility] DEPT Provider Note   CSN: [id number] Arrival date & time: 03/25/19  1503     History Chief Complaint  Patient presents with  . Numbness  . hand swelling    Tamara Osborne is a 44 y.o. female.  The history is provided by the patient and medical records. No language interpreter was used.     44 year old female with history of anxiety, depression, hypertension, recently diagnosed with cervical cancer several weeks ago, currently receiving radiation therapy presenting today complaining of tingling sensation to her hands.  Patient initially seen at urgent care on 03/14/2019 due to increased abnormal uterine bleeding.  Recent Pap smear shows evidence of squamous cells carcinoma.  Patient received blood transfusion when her hemoglobin was found to be 6.1 on 1/13.  Patient is currently being managed by oncologist Dr. Alvy Bimler.  She is here due to having intermittent tingling sensation to the tip of her fingers on both hands for the past 2 days.  She admits that she has underlying anxiety and has been very anxious about her diagnosis.  She endorsed intermittent episodes of tingling sensation lasting for about 3 to 5 minutes that migrates from one fingers in the next most recent was today.  She did not complain of any arm weakness or any significant neck pain or headache or fever.  No COVID-19 symptoms.  She did not notice any tearing sensation around her mouth.  She admits that she does experiencing episodes of hyperventilation when she is under stress.  Past Medical History:  Diagnosis Date  . Anxiety   . Bartholin cyst   . Cervical cancer ([place])   . Depression   . Deviated septum   . Hypertension   . Obesity   . Preeclampsia 2006  . Sleep apnea     Patient Active Problem List   Diagnosis Date Noted  . Anxiety 03/23/2019  . Abnormal vaginal bleeding 03/23/2019  . Squamous cell carcinoma of cervix ([place]) 03/23/2019  . Anemia 03/22/2019  .  Squamous cell carcinoma in situ (SCCIS) of cervix 03/22/2019    Past Surgical History:  Procedure Laterality Date  . CESAREAN SECTION WITH BILATERAL TUBAL LIGATION  11/10/2004      . DILATION AND CURETTAGE OF UTERUS  1997     OB History    Gravida  6   Para  4   Term  3   Preterm  1   AB  2   Living  3     SAB  2   TAB      Ectopic      Multiple      Live Births  3           Family History  Problem Relation Age of Onset  . Brain cancer Mother   . Diabetes Father   . Kidney disease Father   . Cancer Father        unsure type  . Heart failure Sister     Social History   Tobacco Use  . Smoking status: Never Smoker  . Smokeless tobacco: Never Used  Substance Use Topics  . Alcohol use: No  . Drug use: No    Home Medications Prior to Admission medications   Medication Sig Start Date End Date Taking? Authorizing Provider  amLODipine (NORVASC) 10 MG tablet Take 1 tablet (10 mg total) by mouth daily. 06/28/16 03/21/19  Wynona Luna, MD  aspirin EC 81 MG tablet Take 81 mg by mouth as needed (  if BP is elevated).    [provider]  cholecalciferol (VITAMIN D3) 25 MCG (1000 UNIT) tablet Take 1,000 Units by mouth daily.    [provider]  cloNIDine (CATAPRES) 0.1 MG tablet Take 1 tablet (0.1 mg total) by mouth 2 (two) times daily as needed (blood pressure >180/110). 06/28/16   Wynona Luna, MD  fluticasone [facility]) 50 MCG/ACT nasal spray Place 1 spray into both nostrils daily as needed for allergies or rhinitis.    [provider]  norethindrone (AYGESTIN) 5 MG tablet Take 1 tablet (5 mg total) by mouth 2 (two) times daily. 03/16/19   Megan Salon, MD  senna-docusate (SENOKOT-S) 8.6-50 MG tablet Take 2 tablets by mouth at bedtime. To prevent constipation, do not take if having loose stools 03/24/19   Cross, [name] Sciara D, NP  triamterene-hydrochlorothiazide (DYAZIDE) 37.5-25 MG capsule Take 1 each (1 capsule total) by mouth  daily. For 3 days, then as needed for leg swelling. Patient not taking: Reported on 03/21/2019 06/28/16 07/08/16  Wynona Luna, MD    Allergies    Penicillins, Shellfish allergy, and Sulfa antibiotics  Review of Systems   Review of Systems  All other systems reviewed and are negative.   Physical Exam Updated Vital Signs BP (!) 175/100 (BP Location: Right Arm)   Pulse (!) 120   Temp 99.7 F (37.6 C) (Oral)   Resp (!) 22   Ht 5\' 2"  (1.575 m)   Wt 93 kg   LMP 03/14/2019 (Exact Date)   SpO2 100%   BMI 37.49 kg/m   Physical Exam Vitals and nursing note reviewed.  Constitutional:      General: She is not in acute distress.    Appearance: She is well-developed.  HENT:     Head: Atraumatic.  Eyes:     Conjunctiva/sclera: Conjunctivae normal.  Cardiovascular:     Rate and Rhythm: Tachycardia present.     Pulses: Normal pulses.     Heart sounds: Normal heart sounds.  Pulmonary:     Effort: Pulmonary effort is normal.     Breath sounds: Normal breath sounds.  Abdominal:     General: There is distension.     Tenderness: There is no abdominal tenderness.  Musculoskeletal:     Cervical back: Neck supple.     Comments: 5 out of 5 strength to bilateral upper extremities.  Sensation is intact throughout.  Normal grip strength.  Radial pulse 2+.  Full range of motion throughout all fingers with brisk cap refill to fingers.  No cervical spine midline tenderness.  Neck with full range of motion.  Skin:    Findings: No rash.  Neurological:     Mental Status: She is alert and oriented to person, place, and time.     ED Results / Procedures / Treatments   Labs (all labs ordered are listed, but only abnormal results are displayed) Labs Reviewed  CBC WITH DIFFERENTIAL/PLATELET - Abnormal; Notable for the following components:      Result Value   WBC 17.8 (*)    RBC 2.93 (*)    Hemoglobin 6.9 (*)    HCT 22.7 (*)    MCV 77.5 (*)    MCH 23.5 (*)    RDW 18.6 (*)     Platelets 437 (*)    Neutro Abs 15.4 (*)    Abs Immature Granulocytes 0.09 (*)    All other components within normal limits  BASIC METABOLIC PANEL  POC URINE PREG, ED  EKG None  Radiology No results found.  Procedures Procedures (including critical care time)  Medications Ordered in ED Medications - No data to display  ED Course  I have reviewed the triage vital signs and the nursing notes.  Pertinent labs & imaging results that were available during my care of the patient were reviewed by me and considered in my medical decision making (see chart for details).    MDM Rules/Calculators/A&P                      BP (!) 153/82 (BP Location: Left Arm)   Pulse (!) 108   Temp 98.6 F (37 C) (Oral)   Resp 18   Ht 5\' 2"  (1.575 m)   Wt 93 kg   LMP 03/14/2019 (Exact Date)   SpO2 100%   BMI 37.49 kg/m   Final Clinical Impression(s) / ED Diagnoses Final diagnoses:  Paresthesia  Anemia, unspecified type    Rx / DC Orders ED Discharge Orders    None     4:32 PM Patient recently diagnosed with cervical cancer currently undergoing radiation therapy here with tingling sensation to her fingers in both hands that happen intermittently for the past 2 days.  I suspect this is likely secondary to hyperventilation due to anxiety and less likely to be cervical spinal malignancy or stroke.  Will provide antianxiety medication as patient is tachycardic here.  IV fluid given.  Will check basic labs.  5:47 PM Patient reports she feels better at this time.  She prefers not receiving IV fluid or antianxiety medication.  She would just like to know her lab values.  She mentioned she will be seen and managed by her oncologist in 2 days with additional blood work and a port placement therefore she prefers nothing else to potentially delay this process.  She agrees that her symptoms may be anxiety driven and she feels better at this time.  Labs are currently pending.  Patient signed out to  oncoming provider who will follow up on her labs and discharge as appropriate.  6:01 PM Hgb 6.9.  It's a 1g drop from 3 days ago at 7.9.  I offer blood transfusion.  However pt prefers to get blood transfusion in 2 days as schedule by her oncologist.  She is currently denies lightheadedness, cp, sob, or generalized weakness.     Domenic Moras, PA-C 03/25/19 1811    Lacretia Leigh, MD 03/28/19 [phone]

## 2019-03-25 NOTE — ED Triage Notes (Signed)
Patient reports that she is having intermittent bilateral hand numbness and swelling since yesterday Patient states she has been receiving radiation for cervical cancer.

## 2019-03-25 NOTE — Discharge Instructions (Addendum)
Your tingling sensation to your fingers are likely due to anxiety.  Take Vistaril as needed for anxiety.  Call and follow up closely with your oncologist for further management of your newly diagnosed cervical cancer. Your hemoglobin is 6.9.  You will likely benefit from additional blood transfusion on Monday. Return if you have any concerns.

## 2019-03-25 NOTE — ED Notes (Signed)
Patient has a urine sample and culture in the main lab 

## 2019-03-26 ENCOUNTER — Other Ambulatory Visit: Payer: Self-pay | Admitting: Radiology

## 2019-03-26 ENCOUNTER — Encounter (HOSPITAL_COMMUNITY): Payer: Self-pay

## 2019-03-26 ENCOUNTER — Emergency Department (HOSPITAL_COMMUNITY)
Admission: EM | Admit: 2019-03-26 | Discharge: 2019-03-26 | Disposition: A | Discharge status: Home / Self Care | Payer: Medicaid Other | Attending: Emergency Medicine | Admitting: Emergency Medicine

## 2019-03-26 DIAGNOSIS — I10 Essential (primary) hypertension: Secondary | ICD-10-CM | POA: Insufficient documentation

## 2019-03-26 DIAGNOSIS — H538 Other visual disturbances: Secondary | ICD-10-CM | POA: Insufficient documentation

## 2019-03-26 DIAGNOSIS — F419 Anxiety disorder, unspecified: Secondary | ICD-10-CM | POA: Insufficient documentation

## 2019-03-26 DIAGNOSIS — Z79899 Other long term (current) drug therapy: Secondary | ICD-10-CM | POA: Insufficient documentation

## 2019-03-26 DIAGNOSIS — C539 Malignant neoplasm of cervix uteri, unspecified: Secondary | ICD-10-CM | POA: Diagnosis not present

## 2019-03-26 NOTE — ED Notes (Signed)
Discharge paper work reviewed with patient and patient verbalizes understanding.

## 2019-03-26 NOTE — ED Triage Notes (Addendum)
C/o blurred vision and numbness in finger tips  Patient states she has been bleeding for 2 weeks Patient seen yesterday for same and refused blood transfusion   A/ox4  Hx. Cervical Cancer  Had radiation Thursday and Friday    Patient states she just needs someone to talk to and is having anxiety from her new diagnosis.    Patient is saying she would like to speak to a doctor and may not stay. Patient states her doctor told her not to come to ED.

## 2019-03-26 NOTE — ED Provider Notes (Signed)
[facility] DEPT Provider Note   CSN: [id number] Arrival date & time: 03/26/19  1500     History Chief Complaint  Patient presents with  . Blurred Vision    Tamara Osborne is a 44 y.o. female.  44 year old female with history of cervical cancer who presents with increased anxiety.  Patient seen here yesterday due to known anemia.  Hemoglobin at that time was 6.9.  Was offered a blood transfusion but refused.  Patient scheduled to be transfused in the cancer center tomorrow.  States that her vaginal bleeding has slowed down to the point where she is only changing her diaper once a day.  Denies any dizziness when she stands up.  Has not been short of breath.  No new abdominal discomfort.  No chest pain or chest pressure.  Has had intermittent blurred vision which alternates between the left and right eye.  Denies any vision loss or headache.  Also had paresthesias in her fingers which alternate both hands and not in a discernible pattern.  Denies any weakness in her arms or legs.  No trouble ambulating.  Patient states that she does take anxiety pill at night which helps her sleep.  States that her symptoms tend to become worse in the afternoon but get better in the evening towards the time when she needs to take her medication.        Past Medical History:  Diagnosis Date  . Anxiety   . Bartholin cyst   . Cervical cancer ([place])   . Depression   . Deviated septum   . Hypertension   . Obesity   . Preeclampsia 2006  . Sleep apnea     Patient Active Problem List   Diagnosis Date Noted  . Anxiety 03/23/2019  . Abnormal vaginal bleeding 03/23/2019  . Squamous cell carcinoma of cervix ([place]) 03/23/2019  . Anemia 03/22/2019  . Squamous cell carcinoma in situ (SCCIS) of cervix 03/22/2019    Past Surgical History:  Procedure Laterality Date  . CESAREAN SECTION WITH BILATERAL TUBAL LIGATION  11/10/2004      . DILATION AND CURETTAGE OF UTERUS  1997      OB History    Gravida  6   Para  4   Term  3   Preterm  1   AB  2   Living  3     SAB  2   TAB      Ectopic      Multiple      Live Births  3           Family History  Problem Relation Age of Onset  . Brain cancer Mother   . Diabetes Father   . Kidney disease Father   . Cancer Father        unsure type  . Heart failure Sister     Social History   Tobacco Use  . Smoking status: Never Smoker  . Smokeless tobacco: Never Used  Substance Use Topics  . Alcohol use: No  . Drug use: No    Home Medications Prior to Admission medications   Medication Sig Start Date End Date Taking? Authorizing Provider  amLODipine (NORVASC) 10 MG tablet Take 1 tablet (10 mg total) by mouth daily. 06/28/16 03/21/19  Wynona Luna, MD  aspirin EC 81 MG tablet Take 81 mg by mouth as needed (if BP is elevated).    [provider]  cholecalciferol (VITAMIN D3) 25 MCG (1000 UNIT) tablet Take  1,000 Units by mouth daily.    [provider]  cloNIDine (CATAPRES) 0.1 MG tablet Take 1 tablet (0.1 mg total) by mouth 2 (two) times daily as needed (blood pressure >180/110). 06/28/16   Wynona Luna, MD  fluticasone [facility]) 50 MCG/ACT nasal spray Place 1 spray into both nostrils daily as needed for allergies or rhinitis.    [provider]  hydrOXYzine (ATARAX/VISTARIL) 25 MG tablet Take 1 tablet (25 mg total) by mouth every 6 (six) hours as needed for anxiety. 03/25/19   Domenic Moras, PA-C  norethindrone (AYGESTIN) 5 MG tablet Take 1 tablet (5 mg total) by mouth 2 (two) times daily. 03/16/19   Megan Salon, MD  senna-docusate (SENOKOT-S) 8.6-50 MG tablet Take 2 tablets by mouth at bedtime. To prevent constipation, do not take if having loose stools 03/24/19   Cross, [name] Sciara D, NP  triamterene-hydrochlorothiazide (DYAZIDE) 37.5-25 MG capsule Take 1 each (1 capsule total) by mouth daily. For 3 days, then as needed for leg swelling. Patient not taking:  Reported on 03/21/2019 06/28/16 07/08/16  Wynona Luna, MD    Allergies    Penicillins, Shellfish allergy, and Sulfa antibiotics  Review of Systems   Review of Systems  All other systems reviewed and are negative.   Physical Exam Updated Vital Signs BP (!) 142/89 (BP Location: Right Arm)   Pulse (!) 102   Temp 99.3 F (37.4 C) (Oral)   LMP 03/14/2019 (Exact Date)   SpO2 95%   Physical Exam Vitals and nursing note reviewed.  Constitutional:      General: She is not in acute distress.    Appearance: Normal appearance. She is well-developed. She is not toxic-appearing.  HENT:     Head: Normocephalic and atraumatic.  Eyes:     General: Lids are normal. No visual field deficit.    Conjunctiva/sclera: Conjunctivae normal.     Pupils: Pupils are equal, round, and reactive to light.  Neck:     Thyroid: No thyroid mass.     Trachea: No tracheal deviation.  Cardiovascular:     Rate and Rhythm: Normal rate and regular rhythm.     Heart sounds: Normal heart sounds. No murmur. No gallop.   Pulmonary:     Effort: Pulmonary effort is normal. No respiratory distress.     Breath sounds: Normal breath sounds. No stridor. No decreased breath sounds, wheezing, rhonchi or rales.  Abdominal:     General: Bowel sounds are normal. There is no distension.     Palpations: Abdomen is soft.     Tenderness: There is no abdominal tenderness. There is no rebound.  Musculoskeletal:        General: No tenderness. Normal range of motion.     Cervical back: Normal range of motion and neck supple.  Skin:    General: Skin is warm and dry.     Findings: No abrasion or rash.  Neurological:     Mental Status: She is alert and oriented to person, place, and time.     GCS: GCS eye subscore is 4. GCS verbal subscore is 5. GCS motor subscore is 6.     Cranial Nerves: No cranial nerve deficit, dysarthria or facial asymmetry.     Sensory: No sensory deficit.     Motor: No weakness or tremor.      Coordination: Coordination normal.     Gait: Gait normal.  Psychiatric:        Speech: Speech normal.  Behavior: Behavior normal.     ED Results / Procedures / Treatments   Labs (all labs ordered are listed, but only abnormal results are displayed) Labs Reviewed - No data to display  EKG None  Radiology No results found.  Procedures Procedures (including critical care time)  Medications Ordered in ED Medications - No data to display  ED Course  I have reviewed the triage vital signs and the nursing notes.  Pertinent labs & imaging results that were available during my care of the patient were reviewed by me and considered in my medical decision making (see chart for details).    MDM Rules/Calculators/A&P                      Patient symptoms currently to be more social with anxiety.  She has no evidence of volume depletion.  Patient instructed to keep her appointment to see the cancer center tomorrow Final Clinical Impression(s) / ED Diagnoses Final diagnoses:  None    Rx / DC Orders ED Discharge Orders    None       Lacretia Leigh, MD 03/26/19 1528

## 2019-03-26 NOTE — ED Notes (Signed)
Allen, MD at bedside

## 2019-03-26 NOTE — Discharge Instructions (Addendum)
Keep your appointment with your doctor tomorrow °

## 2019-03-27 ENCOUNTER — Inpatient Hospital Stay: Payer: Medicaid Other

## 2019-03-27 ENCOUNTER — Other Ambulatory Visit: Payer: Self-pay

## 2019-03-27 ENCOUNTER — Ambulatory Visit (HOSPITAL_COMMUNITY): Admission: RE | Admit: 2019-03-27 | Payer: Self-pay | Source: Ambulatory Visit

## 2019-03-27 ENCOUNTER — Telehealth: Payer: Self-pay | Admitting: Oncology

## 2019-03-27 ENCOUNTER — Encounter (HOSPITAL_COMMUNITY): Payer: Self-pay

## 2019-03-27 ENCOUNTER — Ambulatory Visit (HOSPITAL_COMMUNITY): Payer: Self-pay

## 2019-03-27 ENCOUNTER — Ambulatory Visit
Admission: RE | Admit: 2019-03-27 | Discharge: 2019-03-27 | Disposition: A | Discharge status: Home / Self Care | Payer: Medicaid Other | Source: Ambulatory Visit | Attending: Radiation Oncology | Admitting: Radiation Oncology

## 2019-03-27 ENCOUNTER — Emergency Department (HOSPITAL_COMMUNITY)
Admission: EM | Admit: 2019-03-27 | Discharge: 2019-03-27 | Disposition: A | Discharge status: Home / Self Care | Payer: Medicaid Other | Attending: Emergency Medicine | Admitting: Emergency Medicine

## 2019-03-27 VITALS — BP 137/73 | HR 97 | Temp 98.5°F | Resp 16

## 2019-03-27 DIAGNOSIS — C539 Malignant neoplasm of cervix uteri, unspecified: Secondary | ICD-10-CM

## 2019-03-27 DIAGNOSIS — D069 Carcinoma in situ of cervix, unspecified: Secondary | ICD-10-CM | POA: Insufficient documentation

## 2019-03-27 DIAGNOSIS — Z79899 Other long term (current) drug therapy: Secondary | ICD-10-CM | POA: Diagnosis not present

## 2019-03-27 DIAGNOSIS — Z510 Encounter for antineoplastic radiation therapy: Secondary | ICD-10-CM | POA: Diagnosis not present

## 2019-03-27 DIAGNOSIS — I10 Essential (primary) hypertension: Secondary | ICD-10-CM | POA: Diagnosis not present

## 2019-03-27 DIAGNOSIS — N939 Abnormal uterine and vaginal bleeding, unspecified: Secondary | ICD-10-CM

## 2019-03-27 LAB — ABO/RH: ABO/RH(D): A NEG

## 2019-03-27 LAB — CBC WITH DIFFERENTIAL/PLATELET
Abs Immature Granulocytes: 0.07 10*3/uL (ref 0.00–0.07)
Abs Immature Granulocytes: 0.11 10*3/uL — ABNORMAL HIGH (ref 0.00–0.07)
Basophils Absolute: 0 10*3/uL (ref 0.0–0.1)
Basophils Absolute: 0 10*3/uL (ref 0.0–0.1)
Basophils Relative: 0 %
Basophils Relative: 0 %
Eosinophils Absolute: 0.2 10*3/uL (ref 0.0–0.5)
Eosinophils Absolute: 0.3 10*3/uL (ref 0.0–0.5)
Eosinophils Relative: 2 %
Eosinophils Relative: 2 %
HCT: 21.6 % — ABNORMAL LOW (ref 36.0–46.0)
HCT: 27.4 % — ABNORMAL LOW (ref 36.0–46.0)
Hemoglobin: 6.4 g/dL — CL (ref 12.0–15.0)
Hemoglobin: 8.6 g/dL — ABNORMAL LOW (ref 12.0–15.0)
Immature Granulocytes: 1 %
Immature Granulocytes: 1 %
Lymphocytes Relative: 7 %
Lymphocytes Relative: 6 %
Lymphs Abs: 0.8 10*3/uL (ref 0.7–4.0)
Lymphs Abs: 0.7 10*3/uL (ref 0.7–4.0)
MCH: 23.1 pg — ABNORMAL LOW (ref 26.0–34.0)
MCH: 25.3 pg — ABNORMAL LOW (ref 26.0–34.0)
MCHC: 29.6 g/dL — ABNORMAL LOW (ref 30.0–36.0)
MCHC: 31.4 g/dL (ref 30.0–36.0)
MCV: 78 fL — ABNORMAL LOW (ref 80.0–100.0)
MCV: 80.6 fL (ref 80.0–100.0)
Monocytes Absolute: 0.4 10*3/uL (ref 0.1–1.0)
Monocytes Absolute: 0.6 10*3/uL (ref 0.1–1.0)
Monocytes Relative: 4 %
Monocytes Relative: 5 %
Neutro Abs: 10 10*3/uL — ABNORMAL HIGH (ref 1.7–7.7)
Neutro Abs: 11.7 10*3/uL — ABNORMAL HIGH (ref 1.7–7.7)
Neutrophils Relative %: 86 %
Neutrophils Relative %: 86 %
Platelets: 423 10*3/uL — ABNORMAL HIGH (ref 150–400)
Platelets: 465 10*3/uL — ABNORMAL HIGH (ref 150–400)
RBC: 2.77 MIL/uL — ABNORMAL LOW (ref 3.87–5.11)
RBC: 3.4 MIL/uL — ABNORMAL LOW (ref 3.87–5.11)
RDW: 18.2 % — ABNORMAL HIGH (ref 11.5–15.5)
RDW: 17.9 % — ABNORMAL HIGH (ref 11.5–15.5)
WBC: 11.5 10*3/uL — ABNORMAL HIGH (ref 4.0–10.5)
WBC: 13.5 10*3/uL — ABNORMAL HIGH (ref 4.0–10.5)
nRBC: 0.3 % — ABNORMAL HIGH (ref 0.0–0.2)
nRBC: 0.4 % — ABNORMAL HIGH (ref 0.0–0.2)

## 2019-03-27 LAB — BASIC METABOLIC PANEL
Anion gap: 10 (ref 5–15)
BUN: 23 mg/dL — ABNORMAL HIGH (ref 6–20)
CO2: 24 mmol/L (ref 22–32)
Calcium: 8.6 mg/dL — ABNORMAL LOW (ref 8.9–10.3)
Chloride: 102 mmol/L (ref 98–111)
Creatinine, Ser: 1.7 mg/dL — ABNORMAL HIGH (ref 0.44–1.00)
GFR calc Af Amer: 42 mL/min — ABNORMAL LOW (ref >60)
GFR calc non Af Amer: 37 mL/min — ABNORMAL LOW (ref >60)
Glucose, Bld: 142 mg/dL — ABNORMAL HIGH (ref 70–99)
Potassium: 3.6 mmol/L (ref 3.5–5.1)
Sodium: 136 mmol/L (ref 135–145)

## 2019-03-27 LAB — SAMPLE TO BLOOD BANK

## 2019-03-27 LAB — COMPREHENSIVE METABOLIC PANEL
ALT: 23 U/L (ref 0–44)
AST: 18 U/L (ref 15–41)
Albumin: 2.8 g/dL — ABNORMAL LOW (ref 3.5–5.0)
Alkaline Phosphatase: 49 U/L (ref 38–126)
Anion gap: 10 (ref 5–15)
BUN: 17 mg/dL (ref 6–20)
CO2: 24 mmol/L (ref 22–32)
Calcium: 8.8 mg/dL — ABNORMAL LOW (ref 8.9–10.3)
Chloride: 104 mmol/L (ref 98–111)
Creatinine, Ser: 1.43 mg/dL — ABNORMAL HIGH (ref 0.44–1.00)
GFR calc Af Amer: 52 mL/min — ABNORMAL LOW (ref >60)
GFR calc non Af Amer: 45 mL/min — ABNORMAL LOW (ref >60)
Glucose, Bld: 94 mg/dL (ref 70–99)
Potassium: 4.1 mmol/L (ref 3.5–5.1)
Sodium: 138 mmol/L (ref 135–145)
Total Bilirubin: 0.3 mg/dL (ref 0.3–1.2)
Total Protein: 7.5 g/dL (ref 6.5–8.1)

## 2019-03-27 LAB — IRON AND TIBC
Iron: 17 ug/dL — ABNORMAL LOW (ref 41–142)
Saturation Ratios: 6 % — ABNORMAL LOW (ref 21–57)
TIBC: 283 ug/dL (ref 236–444)
UIBC: 266 ug/dL (ref 120–384)

## 2019-03-27 LAB — PREPARE RBC (CROSSMATCH)

## 2019-03-27 LAB — FERRITIN: Ferritin: 32 ng/mL (ref 11–307)

## 2019-03-27 LAB — MAGNESIUM: Magnesium: 2.5 mg/dL — ABNORMAL HIGH (ref 1.7–2.4)

## 2019-03-27 MED ORDER — SODIUM CHLORIDE 0.9% IV SOLUTION
250.0000 mL | Freq: Once | INTRAVENOUS | Status: AC
  Administered 2019-03-27: 250 mL via INTRAVENOUS
  Filled 2019-03-27: qty 250

## 2019-03-27 MED ORDER — DIPHENHYDRAMINE HCL 25 MG PO CAPS
ORAL_CAPSULE | ORAL | Status: AC
  Filled 2019-03-27: qty 1

## 2019-03-27 MED ORDER — DIPHENHYDRAMINE HCL 25 MG PO CAPS
25.0000 mg | ORAL_CAPSULE | Freq: Once | ORAL | Status: AC
  Administered 2019-03-27: 12:00:00 25 mg via ORAL

## 2019-03-27 MED ORDER — ACETAMINOPHEN 325 MG PO TABS
ORAL_TABLET | ORAL | Status: AC
  Filled 2019-03-27: qty 2

## 2019-03-27 MED ORDER — ACETAMINOPHEN 325 MG PO TABS
650.0000 mg | ORAL_TABLET | Freq: Once | ORAL | Status: AC
  Administered 2019-03-27: 650 mg via ORAL

## 2019-03-27 MED ORDER — SODIUM CHLORIDE 0.9 % IV SOLN: INTRAVENOUS | Status: DC

## 2019-03-27 NOTE — ED Provider Notes (Signed)
[facility] DEPT Provider Note   CSN: [id number] Arrival date & time: 03/27/19  2028     History Chief Complaint  Patient presents with  . Vaginal Bleeding    Tamara Osborne is a 44 y.o. female.  44 year old female with history of cervical cancer who presents with ongoing vaginal bleeding.  Patient seen at cancer center today and received 2 units of packed red blood cells.  States that her bleeding yesterday has slowed down but then today she has had to change her diaper twice.  States that she has been passing blood clots.  Denies being dizzy or lightheaded.  No weakness noted.  No abdominal discomfort.        Past Medical History:  Diagnosis Date  . Anxiety   . Bartholin cyst   . Cervical cancer ([place])   . Depression   . Deviated septum   . Hypertension   . Obesity   . Preeclampsia 2006  . Sleep apnea     Patient Active Problem List   Diagnosis Date Noted  . Anxiety 03/23/2019  . Abnormal vaginal bleeding 03/23/2019  . Squamous cell carcinoma of cervix ([place]) 03/23/2019  . Anemia 03/22/2019  . Squamous cell carcinoma in situ (SCCIS) of cervix 03/22/2019    Past Surgical History:  Procedure Laterality Date  . CESAREAN SECTION WITH BILATERAL TUBAL LIGATION  11/10/2004      . DILATION AND CURETTAGE OF UTERUS  1997     OB History    Gravida  6   Para  4   Term  3   Preterm  1   AB  2   Living  3     SAB  2   TAB      Ectopic      Multiple      Live Births  3           Family History  Problem Relation Age of Onset  . Brain cancer Mother   . Diabetes Father   . Kidney disease Father   . Cancer Father        unsure type  . Heart failure Sister     Social History   Tobacco Use  . Smoking status: Never Smoker  . Smokeless tobacco: Never Used  Substance Use Topics  . Alcohol use: No  . Drug use: No    Home Medications Prior to Admission medications   Medication Sig Start Date End Date Taking?  Authorizing Provider  amLODipine (NORVASC) 10 MG tablet Take 1 tablet (10 mg total) by mouth daily. 06/28/16 03/27/19 Yes Wynona Luna, MD  aspirin EC 81 MG tablet Take 81 mg by mouth as needed (if BP is elevated).   Yes [provider]  cholecalciferol (VITAMIN D3) 25 MCG (1000 UNIT) tablet Take 1,000 Units by mouth daily.   Yes [provider]  cloNIDine (CATAPRES) 0.1 MG tablet Take 1 tablet (0.1 mg total) by mouth 2 (two) times daily as needed (blood pressure >180/110). 06/28/16  Yes Wynona Luna, MD  fluticasone [facility]) 50 MCG/ACT nasal spray Place 1 spray into both nostrils daily as needed for allergies or rhinitis.   Yes [provider]  hydrOXYzine (ATARAX/VISTARIL) 25 MG tablet Take 1 tablet (25 mg total) by mouth every 6 (six) hours as needed for anxiety. 03/25/19  Yes Domenic Moras, PA-C  norethindrone (AYGESTIN) 5 MG tablet Take 1 tablet (5 mg total) by mouth 2 (two) times daily. 03/16/19  Yes Hale Bogus  S, MD  senna-docusate (SENOKOT-S) 8.6-50 MG tablet Take 2 tablets by mouth at bedtime. To prevent constipation, do not take if having loose stools 03/24/19  Yes Cross, Melissa D, NP  triamterene-hydrochlorothiazide (DYAZIDE) 37.5-25 MG capsule Take 1 each (1 capsule total) by mouth daily. For 3 days, then as needed for leg swelling. Patient not taking: Reported on 03/21/2019 06/28/16 07/08/16  Wynona Luna, MD    Allergies    Penicillins, Shellfish allergy, and Sulfa antibiotics  Review of Systems   Review of Systems  All other systems reviewed and are negative.   Physical Exam Updated Vital Signs BP (!) 153/100 (BP Location: Right Arm)   Pulse (!) 110   Temp 98.1 F (36.7 C) (Oral)   Resp 18   Ht 1.575 m (5\' 2" )   Wt 93 kg   LMP 03/14/2019 (Exact Date)   SpO2 100%   BMI 37.49 kg/m   Physical Exam Vitals and nursing note reviewed.  Constitutional:      General: She is not in acute distress.    Appearance: Normal appearance.  She is well-developed. She is not toxic-appearing.  HENT:     Head: Normocephalic and atraumatic.  Eyes:     General: Lids are normal.     Conjunctiva/sclera: Conjunctivae normal.     Pupils: Pupils are equal, round, and reactive to light.  Neck:     Thyroid: No thyroid mass.     Trachea: No tracheal deviation.  Cardiovascular:     Rate and Rhythm: Normal rate and regular rhythm.     Heart sounds: Normal heart sounds. No murmur. No gallop.   Pulmonary:     Effort: Pulmonary effort is normal. No respiratory distress.     Breath sounds: Normal breath sounds. No stridor. No decreased breath sounds, wheezing, rhonchi or rales.  Abdominal:     General: Bowel sounds are normal. There is no distension.     Palpations: Abdomen is soft.     Tenderness: There is no abdominal tenderness. There is no guarding or rebound.  Musculoskeletal:        General: No tenderness. Normal range of motion.     Cervical back: Normal range of motion and neck supple.  Skin:    General: Skin is warm and dry.     Findings: No abrasion or rash.  Neurological:     Mental Status: She is alert and oriented to person, place, and time.     GCS: GCS eye subscore is 4. GCS verbal subscore is 5. GCS motor subscore is 6.     Cranial Nerves: No cranial nerve deficit.     Sensory: No sensory deficit.  Psychiatric:        Speech: Speech normal.        Behavior: Behavior normal.     ED Results / Procedures / Treatments   Labs (all labs ordered are listed, but only abnormal results are displayed) Labs Reviewed  CBC WITH DIFFERENTIAL/PLATELET - Abnormal; Notable for the following components:      Result Value   WBC 13.5 (*)    RBC 3.40 (*)    Hemoglobin 8.6 (*)    HCT 27.4 (*)    MCH 25.3 (*)    RDW 17.9 (*)    Platelets 465 (*)    nRBC 0.4 (*)    Neutro Abs 11.7 (*)    Abs Immature Granulocytes 0.11 (*)    All other components within normal limits  BASIC METABOLIC PANEL - Abnormal;  Notable for the following  components:   Glucose, Bld 142 (*)    BUN 23 (*)    Creatinine, Ser 1.70 (*)    Calcium 8.6 (*)    GFR calc non Af Amer 37 (*)    GFR calc Af Amer 42 (*)    All other components within normal limits  TYPE AND SCREEN    EKG None  Radiology No results found.  Procedures Procedures (including critical care time)  Medications Ordered in ED Medications  0.9 %  sodium chloride infusion (has no administration in time range)    ED Course  I have reviewed the triage vital signs and the nursing notes.  Pertinent labs & imaging results that were available during my care of the patient were reviewed by me and considered in my medical decision making (see chart for details).    MDM Rules/Calculators/A&P                      Case discussed with Dr. Denman George who is on-call for GYN oncology.  Recommends to recheck patient's CBC which was done here and hemoglobin which was 6.4 earlier today is now 8.6.  She recommends against doing a pelvic exam at this time.  Plan is that so long as her hemoglobin is stable the patient is follow-up in the clinic patient's kidney function is slightly worse than baseline.  Patient to follow-up with her doctor for this as well.  She feels comfortable going home.  Final Clinical Impression(s) / ED Diagnoses Final diagnoses:  None    Rx / DC Orders ED Discharge Orders    None       Lacretia Leigh, MD 03/27/19 2311

## 2019-03-27 NOTE — Telephone Encounter (Addendum)
Tamara Osborne called and said she went to the ER this weekend because she was having double vision and feeling terrible.  Her Hgb was 6.9 and she said she was told she could wait and get a transfusion at the [facility] on Monday.  She said she was told not to have the ER do any procedures for her bleeding. She is here now waiting in the Radiation waiting room.

## 2019-03-27 NOTE — Patient Instructions (Signed)
Blood Transfusion, Adult, Care After This sheet gives you information about how to care for yourself after your procedure. Your doctor may also give you more specific instructions. If you have problems or questions, contact your doctor. What can I expect after the procedure? After the procedure, it is common to have:  Bruising and soreness at the IV site.  A fever or chills on the day of the procedure. This may be your body's response to the new blood cells received.  A headache. Follow these instructions at home: Insertion site care      Follow instructions from your doctor about how to take care of your insertion site. This is where an IV tube was put into your vein. Make sure you: ? Wash your hands with soap and water before and after you change your bandage (dressing). If you cannot use soap and water, use hand sanitizer. ? Change your bandage as told by your doctor.  Check your insertion site every day for signs of infection. Check for: ? Redness, swelling, or pain. ? Bleeding from the site. ? Warmth. ? Pus or a bad smell. General instructions  Take over-the-counter and prescription medicines only as told by your doctor.  Rest as told by your doctor.  Go back to your normal activities as told by your doctor.  Keep all follow-up visits as told by your doctor. This is important. Contact a doctor if:  You have itching or red, swollen areas of skin (hives).  You feel worried or nervous (anxious).  You feel weak after doing your normal activities.  You have redness, swelling, warmth, or pain around the insertion site.  You have blood coming from the insertion site, and the blood does not stop with pressure.  You have pus or a bad smell coming from the insertion site. Get help right away if:  You have signs of a serious reaction. This may be coming from an allergy or the body's defense system (immune system). Signs include: ? Trouble breathing or shortness of  breath. ? Swelling of the face or feeling warm (flushed). ? Fever or chills. ? Head, chest, or back pain. ? Dark pee (urine) or blood in the pee. ? Widespread rash. ? Fast heartbeat. ? Feeling dizzy or light-headed. You may receive your blood transfusion in an outpatient setting. If so, you will be told whom to contact to report any reactions. These symptoms may be an emergency. Do not wait to see if the symptoms will go away. Get medical help right away. Call your local emergency services (911 in the [place] Do not drive yourself to the hospital. Summary  Bruising and soreness at the IV site are common.  Check your insertion site every day for signs of infection.  Rest as told by your doctor. Go back to your normal activities as told by your doctor.  Get help right away if you have signs of a serious reaction. This information is not intended to replace advice given to you by your health care provider. Make sure you discuss any questions you have with your health care provider. Document Revised: 08/11/2018 Document Reviewed: 08/11/2018 Elsevier Patient Education  2020 [employer]  Coronavirus (COVID-19) Are you at risk?  Are you at risk for the Coronavirus (COVID-19)?  To be considered HIGH RISK for Coronavirus (COVID-19), you have to meet the following criteria:  . Traveled to [place], [place], [place], [place] or [place]; or in the [place] to [place], [place], [place], or [place]   [place]; and have fever, cough, and shortness of breath within the last 2 weeks of travel OR . Been in close contact with a person diagnosed with COVID-19 within the last 2 weeks and have fever, cough, and shortness of breath . IF YOU DO NOT MEET THESE CRITERIA, YOU ARE CONSIDERED LOW RISK FOR COVID-19.  What to do if you are HIGH RISK for COVID-19?  . If you are having a medical emergency, call 911. . Seek medical care right away. Before you go to a doctor's office, urgent care or emergency  department, call ahead and tell them about your recent travel, contact with someone diagnosed with COVID-19, and your symptoms. You should receive instructions from your physician's office regarding next steps of care.  . When you arrive at healthcare provider, tell the healthcare staff immediately you have returned from visiting [place], [place], [place], [place] or [place]; or traveled in the [place] to [place], [place], [place], or [place]; in the last two weeks or you have been in close contact with a person diagnosed with COVID-19 in the last 2 weeks.   . Tell the health care staff about your symptoms: fever, cough and shortness of breath. . After you have been seen by a medical provider, you will be either: o Tested for (COVID-19) and discharged home on quarantine except to seek medical care if symptoms worsen, and asked to  - Stay home and avoid contact with others until you get your results (4-5 days)  - Avoid travel on public transportation if possible (such as bus, train, or airplane) or o Sent to the Emergency Department by EMS for evaluation, COVID-19 testing, and possible admission depending on your condition and test results.  What to do if you are LOW RISK for COVID-19?  Reduce your risk of any infection by using the same precautions used for avoiding the common cold or flu:  . Wash your hands often with soap and warm water for at least 20 seconds.  If soap and water are not readily available, use an alcohol-based hand sanitizer with at least 60% alcohol.  . If coughing or sneezing, cover your mouth and nose by coughing or sneezing into the elbow areas of your shirt or coat, into a tissue or into your sleeve (not your hands). . Avoid shaking hands with others and consider head nods or verbal greetings only. . Avoid touching your eyes, nose, or mouth with unwashed hands.  . Avoid close contact with people who are sick. . Avoid places or events with large numbers of people  in one location, like concerts or sporting events. . Carefully consider travel plans you have or are making. . If you are planning any travel outside or inside the [place], visit the CDC's Travelers' Health webpage for the latest health notices. . If you have some symptoms but not all symptoms, continue to monitor at home and seek medical attention if your symptoms worsen. . If you are having a medical emergency, call 911.   ADDITIONAL HEALTHCARE OPTIONS FOR PATIENTS  [facility] / e-Visit: [email or web address]         [facility]: [phone]  [facility]: [phone]                   [facility]: [phone]   

## 2019-03-27 NOTE — Discharge Instructions (Addendum)
Follow-up at the cancer center tomorrow.  Informed the nature creatinine is up to 1.7 and this will require monitoring.  Hemoglobin here has increased after your transfusion.

## 2019-03-27 NOTE — ED Triage Notes (Signed)
Patient arrived stating she was given a blood transfusion today and has been passing large blood clots. States she has changed her diaper twice in the last hour.

## 2019-03-28 ENCOUNTER — Other Ambulatory Visit: Payer: Self-pay

## 2019-03-28 ENCOUNTER — Inpatient Hospital Stay: Payer: Medicaid Other

## 2019-03-28 ENCOUNTER — Other Ambulatory Visit: Payer: Self-pay | Admitting: Hematology and Oncology

## 2019-03-28 ENCOUNTER — Ambulatory Visit
Admission: RE | Admit: 2019-03-28 | Discharge: 2019-03-28 | Disposition: A | Discharge status: Home / Self Care | Payer: Medicaid Other | Source: Ambulatory Visit | Attending: Radiation Oncology | Admitting: Radiation Oncology

## 2019-03-28 ENCOUNTER — Telehealth: Payer: Self-pay | Admitting: Oncology

## 2019-03-28 ENCOUNTER — Encounter
Admission: RE | Admit: 2019-03-28 | Discharge: 2019-03-28 | Disposition: A | Discharge status: Home / Self Care | Payer: Medicaid Other | Source: Ambulatory Visit | Attending: Gynecologic Oncology | Admitting: Gynecologic Oncology

## 2019-03-28 DIAGNOSIS — C539 Malignant neoplasm of cervix uteri, unspecified: Secondary | ICD-10-CM | POA: Diagnosis not present

## 2019-03-28 DIAGNOSIS — N939 Abnormal uterine and vaginal bleeding, unspecified: Secondary | ICD-10-CM

## 2019-03-28 DIAGNOSIS — Z510 Encounter for antineoplastic radiation therapy: Secondary | ICD-10-CM | POA: Diagnosis not present

## 2019-03-28 LAB — CBC WITH DIFFERENTIAL/PLATELET
Abs Immature Granulocytes: 0.1 10*3/uL — ABNORMAL HIGH (ref 0.00–0.07)
Basophils Absolute: 0 10*3/uL (ref 0.0–0.1)
Basophils Relative: 0 %
Eosinophils Absolute: 0.3 10*3/uL (ref 0.0–0.5)
Eosinophils Relative: 3 %
HCT: 23.5 % — ABNORMAL LOW (ref 36.0–46.0)
Hemoglobin: 7.4 g/dL — ABNORMAL LOW (ref 12.0–15.0)
Immature Granulocytes: 1 %
Lymphocytes Relative: 7 %
Lymphs Abs: 0.7 10*3/uL (ref 0.7–4.0)
MCH: 24.7 pg — ABNORMAL LOW (ref 26.0–34.0)
MCHC: 31.5 g/dL (ref 30.0–36.0)
MCV: 78.6 fL — ABNORMAL LOW (ref 80.0–100.0)
Monocytes Absolute: 0.5 10*3/uL (ref 0.1–1.0)
Monocytes Relative: 5 %
Neutro Abs: 9.1 10*3/uL — ABNORMAL HIGH (ref 1.7–7.7)
Neutrophils Relative %: 84 %
Platelets: 361 10*3/uL (ref 150–400)
RBC: 2.99 MIL/uL — ABNORMAL LOW (ref 3.87–5.11)
RDW: 17.9 % — ABNORMAL HIGH (ref 11.5–15.5)
WBC: 10.7 10*3/uL — ABNORMAL HIGH (ref 4.0–10.5)
nRBC: 0.2 % (ref 0.0–0.2)

## 2019-03-28 LAB — TYPE AND SCREEN
ABO/RH(D): A NEG
ABO/RH(D): A NEG
Antibody Screen: NEGATIVE
Antibody Screen: NEGATIVE
Unit division: 0
Unit division: 0

## 2019-03-28 LAB — BPAM RBC
Blood Product Expiration Date: 202102102359
Blood Product Expiration Date: 202102102359
ISSUE DATE / TIME: 202101251141
ISSUE DATE / TIME: 202101251141
Unit Type and Rh: 600
Unit Type and Rh: 600

## 2019-03-28 LAB — COMPREHENSIVE METABOLIC PANEL
ALT: 24 U/L (ref 0–44)
AST: 17 U/L (ref 15–41)
Albumin: 2.6 g/dL — ABNORMAL LOW (ref 3.5–5.0)
Alkaline Phosphatase: 44 U/L (ref 38–126)
Anion gap: 11 (ref 5–15)
BUN: 19 mg/dL (ref 6–20)
CO2: 23 mmol/L (ref 22–32)
Calcium: 8.5 mg/dL — ABNORMAL LOW (ref 8.9–10.3)
Chloride: 105 mmol/L (ref 98–111)
Creatinine, Ser: 1.35 mg/dL — ABNORMAL HIGH (ref 0.44–1.00)
GFR calc Af Amer: 56 mL/min — ABNORMAL LOW (ref >60)
GFR calc non Af Amer: 48 mL/min — ABNORMAL LOW (ref >60)
Glucose, Bld: 105 mg/dL — ABNORMAL HIGH (ref 70–99)
Potassium: 3.9 mmol/L (ref 3.5–5.1)
Sodium: 139 mmol/L (ref 135–145)
Total Bilirubin: 0.6 mg/dL (ref 0.3–1.2)
Total Protein: 6.9 g/dL (ref 6.5–8.1)

## 2019-03-28 LAB — ABO/RH: ABO/RH(D): A NEG

## 2019-03-28 LAB — GLUCOSE, CAPILLARY: Glucose-Capillary: 98 mg/dL (ref 70–99)

## 2019-03-28 LAB — PREPARE RBC (CROSSMATCH)

## 2019-03-28 MED ORDER — FLUDEOXYGLUCOSE F - 18 (FDG) INJECTION
11.0500 | Freq: Once | INTRAVENOUS | Status: AC | PRN
  Administered 2019-03-28: 13:00:00 11.05 via INTRAVENOUS

## 2019-03-28 NOTE — Telephone Encounter (Signed)
Left a message advising Tamara Osborne that she can stop taking Aygestin per Dr. Berline Lopes.

## 2019-03-28 NOTE — Telephone Encounter (Signed)
Tamara Osborne said that she went to the ER last night with vaginal bleeding/clots.  She said it was "a whole lot" and was like opening a can of cranberry sauce.  She said she starts to get anxious when she is home and far away from the hospital.  She also asked if she should still keep taking norethindrone for the bleeding.  Advised her that I will check with Dr. Charisse March office and get back to her.

## 2019-03-29 ENCOUNTER — Encounter: Payer: Self-pay | Admitting: Hematology and Oncology

## 2019-03-29 ENCOUNTER — Ambulatory Visit
Admission: RE | Admit: 2019-03-29 | Discharge: 2019-03-29 | Disposition: A | Discharge status: Home / Self Care | Payer: Medicaid Other | Source: Ambulatory Visit | Attending: Radiation Oncology | Admitting: Radiation Oncology

## 2019-03-29 ENCOUNTER — Telehealth: Payer: Self-pay | Admitting: Hematology and Oncology

## 2019-03-29 ENCOUNTER — Other Ambulatory Visit: Payer: Self-pay

## 2019-03-29 ENCOUNTER — Encounter: Payer: Self-pay | Admitting: Oncology

## 2019-03-29 ENCOUNTER — Other Ambulatory Visit: Payer: Self-pay | Admitting: Radiology

## 2019-03-29 ENCOUNTER — Inpatient Hospital Stay (HOSPITAL_BASED_OUTPATIENT_CLINIC_OR_DEPARTMENT_OTHER): Payer: Medicaid Other | Admitting: Hematology and Oncology

## 2019-03-29 ENCOUNTER — Inpatient Hospital Stay: Payer: Medicaid Other

## 2019-03-29 ENCOUNTER — Other Ambulatory Visit: Payer: Self-pay | Admitting: Hematology and Oncology

## 2019-03-29 VITALS — BP 160/74 | HR 120 | Temp 98.7°F | Resp 16 | Ht 62.0 in | Wt 204.7 lb

## 2019-03-29 DIAGNOSIS — Z510 Encounter for antineoplastic radiation therapy: Secondary | ICD-10-CM | POA: Diagnosis not present

## 2019-03-29 DIAGNOSIS — C539 Malignant neoplasm of cervix uteri, unspecified: Secondary | ICD-10-CM | POA: Diagnosis not present

## 2019-03-29 DIAGNOSIS — F411 Generalized anxiety disorder: Secondary | ICD-10-CM | POA: Diagnosis not present

## 2019-03-29 DIAGNOSIS — N183 Chronic kidney disease, stage 3 unspecified: Secondary | ICD-10-CM

## 2019-03-29 DIAGNOSIS — R600 Localized edema: Secondary | ICD-10-CM

## 2019-03-29 DIAGNOSIS — N939 Abnormal uterine and vaginal bleeding, unspecified: Secondary | ICD-10-CM | POA: Diagnosis not present

## 2019-03-29 DIAGNOSIS — I10 Essential (primary) hypertension: Secondary | ICD-10-CM

## 2019-03-29 LAB — URINE CULTURE: Culture: NO GROWTH

## 2019-03-29 MED ORDER — PROCHLORPERAZINE MALEATE 10 MG PO TABS: 10.0000 mg | ORAL_TABLET | Freq: Four times a day (QID) | ORAL | 1 refills | Status: DC | PRN

## 2019-03-29 MED ORDER — DIPHENHYDRAMINE HCL 25 MG PO CAPS
25.0000 mg | ORAL_CAPSULE | Freq: Once | ORAL | Status: AC
  Administered 2019-03-29: 12:00:00 25 mg via ORAL

## 2019-03-29 MED ORDER — LIDOCAINE-PRILOCAINE 2.5-2.5 % EX CREA: TOPICAL_CREAM | CUTANEOUS | 3 refills | Status: DC

## 2019-03-29 MED ORDER — SODIUM CHLORIDE 0.9% IV SOLUTION
250.0000 mL | Freq: Once | INTRAVENOUS | Status: AC
  Administered 2019-03-29: 13:00:00 250 mL via INTRAVENOUS
  Filled 2019-03-29: qty 250

## 2019-03-29 MED ORDER — ACETAMINOPHEN 325 MG PO TABS
ORAL_TABLET | ORAL | Status: AC
  Filled 2019-03-29: qty 2

## 2019-03-29 MED ORDER — ACETAMINOPHEN 325 MG PO TABS
650.0000 mg | ORAL_TABLET | Freq: Once | ORAL | Status: AC
  Administered 2019-03-29: 12:00:00 650 mg via ORAL

## 2019-03-29 MED ORDER — DIPHENHYDRAMINE HCL 25 MG PO CAPS
ORAL_CAPSULE | ORAL | Status: AC
  Filled 2019-03-29: qty 1

## 2019-03-29 MED ORDER — ONDANSETRON HCL 8 MG PO TABS: 8.0000 mg | ORAL_TABLET | Freq: Three times a day (TID) | ORAL | 1 refills | Status: DC | PRN

## 2019-03-29 NOTE — Telephone Encounter (Signed)
Scheduled appt per 1/27 sch message - left message with appts - unable to schedule for 1/29 - left message for pt that we will call her about that apt

## 2019-03-29 NOTE — Patient Instructions (Signed)

## 2019-03-29 NOTE — Progress Notes (Signed)
Met with patient in hallway and infusion room to introduce myself as Arboriculturist and to offer available resources.  Discussed one-time $1000 Radio broadcast assistant to assist with personal expenses while going through treatment such as medications and gas cards. She had her proof of income with her. Approved patient for the grant. Gave her a copy of the approval letter as well as the expense sheet and Outpatient pharmacy information. Went over in detail expenses and how they are covered. She received a gas card today from the grant.  Gave her a Medicaid application and highlit what needs to be completed and she can submit to DSS in her county. She was given one by navigator as well.  She wanted to speak with social worker regarding resources. Sent in-basket message to team.  She has my card for any additional financial questions or concerns.

## 2019-03-29 NOTE — Assessment & Plan Note (Addendum)
I have reviewed results of PET scan with the patient and her sister I explained to her why surgery is not indicated Given her young age, we want to treat her aggressively with concurrent chemoradiation therapy We discussed the role of chemotherapy. The intent is of curative intent.  We discussed some of the risks, benefits, side-effects of cisplatin and its role as chemo sensitizing agent. The plan for weekly cisplatin for x5 to 6 doses along with radiation treatment.  Some of the short term side-effects included, though not limited to, including weight loss, life threatening infections, risk of allergic reactions, need for transfusions of blood products, nausea, vomiting, change in bowel habits, loss of hair, admission to hospital for various reasons, and risks of death.   Long term side-effects are also discussed including risks of infertility, permanent damage to nerve function, hearing loss, chronic fatigue, kidney damage with possibility needing hemodialysis, and rare secondary malignancy including bone marrow disorders.  The patient is aware that the response rates discussed earlier is not guaranteed.  After a long discussion, patient made an informed decision to proceed with the prescribed plan of care.   Patient education material was dispensed.  Due to her elevated baseline creatinine, I plan to reduce the dose of chemotherapy upfront at 30 mg/m She will get her blood work checked weekly and I will see her weekly for supportive care

## 2019-03-29 NOTE — Assessment & Plan Note (Signed)
Due to her ongoing bleeding, I have ordered 2 more units of blood today She will get her blood checked on a weekly basis Even though she would qualify for IV iron, while she is undergoing treatment, I will continue to transfuse her to keep her hemoglobin greater than 9

## 2019-03-30 ENCOUNTER — Ambulatory Visit (HOSPITAL_COMMUNITY)
Admission: RE | Admit: 2019-03-30 | Discharge: 2019-03-30 | Disposition: A | Discharge status: Home / Self Care | Payer: Self-pay | Source: Ambulatory Visit | Attending: Hematology and Oncology | Admitting: Hematology and Oncology

## 2019-03-30 ENCOUNTER — Ambulatory Visit
Admission: RE | Admit: 2019-03-30 | Discharge: 2019-03-30 | Disposition: A | Discharge status: Home / Self Care | Payer: Medicaid Other | Source: Ambulatory Visit | Attending: Radiation Oncology | Admitting: Radiation Oncology

## 2019-03-30 ENCOUNTER — Other Ambulatory Visit: Payer: Self-pay

## 2019-03-30 ENCOUNTER — Encounter (HOSPITAL_COMMUNITY): Payer: Self-pay

## 2019-03-30 ENCOUNTER — Telehealth: Payer: Self-pay

## 2019-03-30 ENCOUNTER — Telehealth: Payer: Self-pay | Admitting: Oncology

## 2019-03-30 ENCOUNTER — Inpatient Hospital Stay: Payer: Medicaid Other

## 2019-03-30 DIAGNOSIS — R600 Localized edema: Secondary | ICD-10-CM | POA: Insufficient documentation

## 2019-03-30 DIAGNOSIS — Z510 Encounter for antineoplastic radiation therapy: Secondary | ICD-10-CM | POA: Diagnosis not present

## 2019-03-30 DIAGNOSIS — C539 Malignant neoplasm of cervix uteri, unspecified: Secondary | ICD-10-CM | POA: Diagnosis not present

## 2019-03-30 DIAGNOSIS — F411 Generalized anxiety disorder: Secondary | ICD-10-CM | POA: Insufficient documentation

## 2019-03-30 DIAGNOSIS — N183 Chronic kidney disease, stage 3 unspecified: Secondary | ICD-10-CM | POA: Insufficient documentation

## 2019-03-30 DIAGNOSIS — E1159 Type 2 diabetes mellitus with other circulatory complications: Secondary | ICD-10-CM | POA: Insufficient documentation

## 2019-03-30 DIAGNOSIS — I10 Essential (primary) hypertension: Secondary | ICD-10-CM | POA: Insufficient documentation

## 2019-03-30 LAB — CBC WITH DIFFERENTIAL/PLATELET
Abs Immature Granulocytes: 0.15 10*3/uL — ABNORMAL HIGH (ref 0.00–0.07)
Basophils Absolute: 0 10*3/uL (ref 0.0–0.1)
Basophils Relative: 0 %
Eosinophils Absolute: 0.3 10*3/uL (ref 0.0–0.5)
Eosinophils Relative: 3 %
HCT: 28.8 % — ABNORMAL LOW (ref 36.0–46.0)
Hemoglobin: 9.1 g/dL — ABNORMAL LOW (ref 12.0–15.0)
Immature Granulocytes: 1 %
Lymphocytes Relative: 5 %
Lymphs Abs: 0.5 10*3/uL — ABNORMAL LOW (ref 0.7–4.0)
MCH: 26.5 pg (ref 26.0–34.0)
MCHC: 31.6 g/dL (ref 30.0–36.0)
MCV: 83.7 fL (ref 80.0–100.0)
Monocytes Absolute: 0.6 10*3/uL (ref 0.1–1.0)
Monocytes Relative: 5 %
Neutro Abs: 9 10*3/uL — ABNORMAL HIGH (ref 1.7–7.7)
Neutrophils Relative %: 86 %
Platelets: 334 10*3/uL (ref 150–400)
RBC: 3.44 MIL/uL — ABNORMAL LOW (ref 3.87–5.11)
RDW: 18.9 % — ABNORMAL HIGH (ref 11.5–15.5)
WBC: 10.6 10*3/uL — ABNORMAL HIGH (ref 4.0–10.5)
nRBC: 0.2 % (ref 0.0–0.2)

## 2019-03-30 LAB — BPAM RBC
Blood Product Expiration Date: 202102142359
Blood Product Expiration Date: 202102172359
ISSUE DATE / TIME: 202101271252
ISSUE DATE / TIME: 202101271252
Unit Type and Rh: 600
Unit Type and Rh: 600

## 2019-03-30 LAB — TYPE AND SCREEN
ABO/RH(D): A NEG
Antibody Screen: NEGATIVE
Unit division: 0
Unit division: 0

## 2019-03-30 LAB — SAMPLE TO BLOOD BANK

## 2019-03-30 MED ORDER — CLINDAMYCIN PHOSPHATE 900 MG/50ML IV SOLN: 900.0000 mg | Freq: Once | INTRAVENOUS | Status: DC

## 2019-03-30 MED ORDER — SODIUM CHLORIDE 0.9 % IV SOLN: INTRAVENOUS | Status: DC

## 2019-03-30 MED FILL — ONDANSETRON HCL 8 MG TABLET: 8 | 10 days supply | Qty: 30 | Fill #0

## 2019-03-30 MED FILL — LIDOCAINE-PRILOCAINE CREAM: 2.5-2.5 | 20 days supply | Qty: 30 | Fill #0

## 2019-03-30 MED FILL — PROCHLORPERAZINE 10 MG TAB: 10 | 7 days supply | Qty: 30 | Fill #0

## 2019-03-30 NOTE — Telephone Encounter (Signed)
Called and left a message. She does not need a blood transfusion and her CBC is good. Ask her to call for questions.

## 2019-03-30 NOTE — Progress Notes (Signed)
Tamara Osborne CONSULT NOTE  Patient Care Team: Pllc, [facility] as PCP - General (Family Medicine)  ASSESSMENT & PLAN:  Squamous cell carcinoma of cervix ([place]) I have reviewed results of PET scan with the patient and her sister I explained to her why surgery is not indicated Given her young age, we want to treat her aggressively with concurrent chemoradiation therapy We discussed the role of chemotherapy. The intent is of curative intent.  We discussed some of the risks, benefits, side-effects of cisplatin and its role as chemo sensitizing agent. The plan for weekly cisplatin for x5 to 6 doses along with radiation treatment.  Some of the short term side-effects included, though not limited to, including weight loss, life threatening infections, risk of allergic reactions, need for transfusions of blood products, nausea, vomiting, change in bowel habits, loss of hair, admission to hospital for various reasons, and risks of death.   Long term side-effects are also discussed including risks of infertility, permanent damage to nerve function, hearing loss, chronic fatigue, kidney damage with possibility needing hemodialysis, and rare secondary malignancy including bone marrow disorders.  The patient is aware that the response rates discussed earlier is not guaranteed.  After a long discussion, patient made an informed decision to proceed with the prescribed plan of care.   Patient education material was dispensed.  Due to her elevated baseline creatinine, I plan to reduce the dose of chemotherapy upfront at 30 mg/m She will get her blood work checked weekly and I will see her weekly for supportive care   Abnormal vaginal bleeding Due to her ongoing bleeding, I have ordered 2 more units of blood today She will get her blood checked on a weekly basis Even though she would qualify for IV iron, while she is undergoing treatment, I will continue to transfuse her to  keep her hemoglobin greater than 9  CKD (chronic kidney disease), symptom management only, stage 3 (moderate) She has chronic kidney disease stage III likely secondary to chronic hypertension I will modify the dose of cisplatin I spent a lot of time educating the patient and her sister the importance of adequate hydration I have discontinue her diuretic therapy  Generalized anxiety disorder She has significant, generalized anxiety We discussed the role of counseling and medications and for now, she is interested to pursue counseling I recommend our social worker to touch base with the patient  Essential hypertension She has significant, elevated blood pressure could be exacerbated by a component of anxiety I recommend she checks her blood pressure twice a day at home I will review her blood pressure management next week and we will adjust her medications accordingly  Bilateral edema of lower extremity She has significant bilateral lower extremity edema secondary to sedentary lifestyle right now from the bleeding and moderate protein calorie malnutrition Despite the leg edema, due to high risk of dehydration and kidney injury, I have discontinue her diuretic therapy We will monitor closely   No orders of the defined types were placed in this encounter.   The total time spent in the appointment was 60 minutes encounter with patients including review of chart and various tests results, discussions about plan of care and coordination of care plan   All questions were answered. The patient knows to call the clinic with any problems, questions or concerns. No barriers to learning was detected.  [name], MD 1/28/20217:59 AM  CHIEF COMPLAINTS/PURPOSE OF CONSULTATION:  Advanced squamous cell carcinoma of the cervix, for  concurrent chemoradiation therapy  HISTORY OF PRESENTING ILLNESS:  Tamara Osborne 44 y.o. female is here because of recent diagnosis of squamous cell carcinoma of  the cervix She is here accompanied by her sister, Sharyn Lull The patient is currently unemployed.  She has 4 children ages 13-22.  She lives with her boyfriend. She has history of abnormal Pap smear in the past.  That was around 2006 or 2007. Her last Pap smear was approximately 3 to 4 years ago.  The patient relocated to [facility] several years ago due to her job situation Throughout her life, she always have irregular, heavy menstruation. Recently, she started to have profound, uncontrolled heavy bleeding culminating to multiple emergency room visits and GYN evaluation. Her initial biopsy showed at least carcinoma in situ but subsequent repeat cervical biopsy confirmed diagnosis of invasive squamous cell carcinoma. The patient has received multiple units of blood transfusion secondary to heavy menstruation and was told to take oral iron supplement which she did not tolerate well She was started on radiation therapy last week due to the severity of her heavy bleeding  I have reviewed her chart and materials related to her cancer extensively and collaborated history with the patient. Summary of oncologic history is as follows: Oncology History  Squamous cell carcinoma of cervix ([place])  03/21/2019 Pathology Results   The biopsy is superficial and therefore depth of invasion cannot be  determined.  There is at least carcinoma in-situ.    03/23/2019 Initial Diagnosis   Squamous cell carcinoma of cervix ([place])   03/23/2019 Pathology Results   CERVIX, 11 O CLOCK, BIOPSY:  -  Squamous cell carcinoma, invasive  -  See comment   03/28/2019 PET scan   1. Hypermetabolic cervical mass measures roughly 7.6 by 6.9 by 4.9 cm, compatible with malignancy. No adenopathy or distant metastatic spread identified. 2. Extending cephalad and anterior to the uterine fundus, there is a 450 cubic cm simple fluid density structure without hypermetabolic activity. This may represent a right adnexal cyst (favored),  peritoneal inclusion cyst, or (if the patient has a remote history of pancreatitis) a remote pseudocyst. 3. Faint ground density nodularity in both lower lobes which could be from atypical infection or extrinsic allergic alveolitis. Given nodular appearance of some of this ground-glass density, I would recommend follow up chest CT in 3 months time in order to ensure clearance. 4. Moderate cardiomegaly, cause uncertain. The patient also has advanced for age/gender atherosclerosis. Aortic Atherosclerosis (ICD10-I70.0).      Her appetite is fair.  She denies pelvic pain.  She have regular bowel habits so far. She had recent dizziness and shortness of breath prior to transfusion but since then, it has improved  MEDICAL HISTORY:  Past Medical History:  Diagnosis Date  . Anxiety   . Bartholin cyst   . Cervical cancer ([place])   . Chronic kidney disease   . Depression   . Deviated septum   . Hypertension   . Obesity   . Preeclampsia 2006  . Sleep apnea     SURGICAL HISTORY: Past Surgical History:  Procedure Laterality Date  . CESAREAN SECTION WITH BILATERAL TUBAL LIGATION  11/10/2004      . DILATION AND CURETTAGE OF UTERUS  1997    SOCIAL HISTORY: Social History   Socioeconomic History  . Marital status: Single    Spouse name: Not on file  . Number of children: 4  . Years of education: Not on file  . Highest education level: Not on file  Occupational History  . Not on file  Tobacco Use  . Smoking status: Never Smoker  . Smokeless tobacco: Never Used  Substance and Sexual Activity  . Alcohol use: No  . Drug use: No  . Sexual activity: Not Currently    Birth control/protection: Surgical  Other Topics Concern  . Not on file  Social History Narrative   Lives with her boyfriend   Social Determinants of Health   Financial Resource Strain:   . Difficulty of Paying Living Expenses: Not on file  Food Insecurity:   . Worried About Charity fundraiser in the Last Year: Not on  file  . Ran Out of Food in the Last Year: Not on file  Transportation Needs:   . Lack of Transportation (Medical): Not on file  . Lack of Transportation (Non-Medical): Not on file  Physical Activity:   . Days of Exercise per Week: Not on file  . Minutes of Exercise per Session: Not on file  Stress:   . Feeling of Stress : Not on file  Social Connections:   . Frequency of Communication with Friends and Family: Not on file  . Frequency of Social Gatherings with Friends and Family: Not on file  . Attends Religious Services: Not on file  . Active Member of Clubs or Organizations: Not on file  . Attends Archivist Meetings: Not on file  . Marital Status: Not on file  Intimate Partner Violence:   . Fear of Current or Ex-Partner: Not on file  . Emotionally Abused: Not on file  . Physically Abused: Not on file  . Sexually Abused: Not on file    FAMILY HISTORY: Family History  Problem Relation Age of Onset  . Brain cancer Mother        lung cancer to brain  . Diabetes Father   . Kidney disease Father   . Cancer Father        kidney cancer  . Heart failure Sister     ALLERGIES:  is allergic to penicillins; shellfish allergy; and sulfa antibiotics.  MEDICATIONS:  Current Outpatient Medications  Medication Sig Dispense Refill  . amLODipine (NORVASC) 10 MG tablet Take 1 tablet (10 mg total) by mouth daily. 90 tablet 0  . cholecalciferol (VITAMIN D3) 25 MCG (1000 UNIT) tablet Take 1,000 Units by mouth daily.    . cloNIDine (CATAPRES) 0.1 MG tablet Take 1 tablet (0.1 mg total) by mouth 2 (two) times daily as needed (blood pressure >180/110). 50 tablet 0  . fluticasone (FLONASE) 50 MCG/ACT nasal spray Place 1 spray into both nostrils daily as needed for allergies or rhinitis.    . hydrOXYzine (ATARAX/VISTARIL) 25 MG tablet Take 1 tablet (25 mg total) by mouth every 6 (six) hours as needed for anxiety. 20 tablet 0  . lidocaine-prilocaine (EMLA) cream Apply to affected area once  30 g 3  . ondansetron (ZOFRAN) 8 MG tablet Take 1 tablet (8 mg total) by mouth every 8 (eight) hours as needed. 30 tablet 1  . prochlorperazine (COMPAZINE) 10 MG tablet Take 1 tablet (10 mg total) by mouth every 6 (six) hours as needed (Nausea or vomiting). 30 tablet 1  . senna-docusate (SENOKOT-S) 8.6-50 MG tablet Take 2 tablets by mouth at bedtime. To prevent constipation, do not take if having loose stools 60 tablet 1   No current facility-administered medications for this visit.    REVIEW OF SYSTEMS:   Constitutional: Denies fevers, chills or abnormal night sweats Eyes: Denies blurriness of  vision, double vision or watery eyes Ears, nose, mouth, throat, and face: Denies mucositis or sore throat Respiratory: Denies cough, dyspnea or wheezes Cardiovascular: Denies palpitation, chest discomfort  Gastrointestinal:  Denies nausea, heartburn or change in bowel habits Skin: Denies abnormal skin rashes Lymphatics: Denies new lymphadenopathy or easy bruising Neurological:Denies numbness, tingling or new weaknesses Behavioral/Psych: Mood is stable, no new changes  All other systems were reviewed with the patient and are negative.  PHYSICAL EXAMINATION: ECOG PERFORMANCE STATUS: 1 - Symptomatic but completely ambulatory  Vitals:   03/29/19 1010  BP: (!) 160/74  Pulse: (!) 120  Resp: 16  Temp: 98.7 F (37.1 C)  SpO2: 100%   Filed Weights   03/29/19 1010  Weight: 204 lb 11.2 oz (92.9 kg)    GENERAL:alert, no distress and comfortable.  She appears comfortable, sitting on the wheelchair SKIN: skin color, texture, turgor are normal, no rashes or significant lesions EYES: normal, conjunctiva are pink and non-injected, sclera clear OROPHARYNX:no exudate, no erythema and lips, buccal mucosa, and tongue normal  NECK: supple, thyroid normal size, non-tender, without nodularity LYMPH:  no palpable lymphadenopathy in the cervical, axillary or inguinal LUNGS: clear to auscultation and  percussion with normal breathing effort HEART: Tachycardia, mild soft murmurs with mild bilateral lower extremity edema ABDOMEN:abdomen soft, non-tender and normal bowel sounds Musculoskeletal:no cyanosis of digits and no clubbing  PSYCH: alert & oriented x 3 with fluent speech NEURO: no focal motor/sensory deficits  LABORATORY DATA:  I have reviewed the data as listed Lab Results  Component Value Date   WBC 10.7 (H) 03/28/2019   HGB 7.4 (L) 03/28/2019   HCT 23.5 (L) 03/28/2019   MCV 78.6 (L) 03/28/2019   PLT 361 03/28/2019   Recent Labs    03/22/19 2135 03/23/19 0841 03/27/19 0946 03/27/19 2207 03/28/19 0850  NA 140   < > 138 136 139  K 3.8   < > 4.1 3.6 3.9  CL 107   < > 104 102 105  CO2 23   < > 24 24 23   GLUCOSE 157*   < > 94 142* 105*  BUN 11   < > 17 23* 19  CREATININE 1.37*   < > 1.43* 1.70* 1.35*  CALCIUM 8.8*   < > 8.8* 8.6* 8.5*  GFRNONAA 47*   < > 45* 37* 48*  GFRAA 55*   < > 52* 42* 56*  PROT 6.9  --  7.5  --  6.9  ALBUMIN 3.0*  --  2.8*  --  2.6*  AST 15  --  18  --  17  ALT 14  --  23  --  24  ALKPHOS 51  --  49  --  44  BILITOT 0.4  --  0.3  --  0.6   < > = values in this interval not displayed.    RADIOGRAPHIC STUDIES: I have personally reviewed the radiological images as listed and agreed with the findings in the report. US Transvaginal Non-OB  Result Date: 03/16/2019 SEE PROGRESS NOTE  NM PET Image Initial (PI) Skull Base To Thigh  Result Date: 03/28/2019 CLINICAL DATA:  Initial treatment strategy for cervical cancer. EXAM: NUCLEAR MEDICINE PET SKULL BASE TO THIGH TECHNIQUE: 11.1 mCi F-18 FDG was injected intravenously. Full-ring PET imaging was performed from the skull base to thigh after the radiotracer. CT data was obtained and used for attenuation correction and anatomic localization. Fasting blood glucose: 98 mg/dl COMPARISON:  Transvaginal ultrasound 03/16/2019 2 FINDINGS: Mediastinal blood  pool activity: SUV max 2.4 Liver activity: SUV max  NA NECK: No significant abnormal hypermetabolic activity in this region. Incidental CT findings: none CHEST: 0.7 cm ground-glass density nodule in the left lower lobe on image 116/3. Adjacent 1.2 cm ground-glass density nodule in the left lower lobe on image 112/3. Subtle ground-glass density in the superior segment right lower lobe on image 93/3, difficult to measure due to indistinct margins. No significant associated accentuated metabolic activity. Incidental CT findings: Moderate cardiomegaly. Mild descending thoracic aortic atherosclerotic calcification. ABDOMEN/PELVIS: Hypermetabolic cervical mass noted, maximum SUV 23.9. Associated abnormal metabolic activity measures approximately 7.6 by 6.9 by 4.9 cm. No pathologically enlarged or hypermetabolic adenopathy observed in the abdomen or pelvis Incidental CT findings: Extending cephalad and anterior to the uterine fundus, and 11.3 by 8.3 by 9.2 cm (volume = 450 cm^3) simple fluid density structure is present. This appears photopenic on the PET-CT. This may minimally abut the right adnexa but appears separate from the left ovary/adnexa. Aortoiliac atherosclerotic vascular disease. SKELETON: Symmetric low-grade activity throughout the skeleton without any corresponding lesion on the CT data, likely from granulocyte stimulation. Incidental CT findings: none IMPRESSION: 1. Hypermetabolic cervical mass measures roughly 7.6 by 6.9 by 4.9 cm, compatible with malignancy. No adenopathy or distant metastatic spread identified. 2. Extending cephalad and anterior to the uterine fundus, there is a 450 cubic cm simple fluid density structure without hypermetabolic activity. This may represent a right adnexal cyst (favored), peritoneal inclusion cyst, or (if the patient has a remote history of pancreatitis) a remote pseudocyst. 3. Faint ground density nodularity in both lower lobes which could be from atypical infection or extrinsic allergic alveolitis. Given nodular  appearance of some of this ground-glass density, I would recommend follow up chest CT in 3 months time in order to ensure clearance. 4. Moderate cardiomegaly, cause uncertain. The patient also has advanced for age/gender atherosclerosis. Aortic Atherosclerosis (ICD10-I70.0). Electronically Signed   By: Van Clines M.D.   On: 03/28/2019 15:22

## 2019-03-30 NOTE — Assessment & Plan Note (Signed)
She has significant, generalized anxiety We discussed the role of counseling and medications and for now, she is interested to pursue counseling I recommend our social worker to touch base with the patient

## 2019-03-30 NOTE — Assessment & Plan Note (Signed)
She has significant, elevated blood pressure could be exacerbated by a component of anxiety I recommend she checks her blood pressure twice a day at home I will review her blood pressure management next week and we will adjust her medications accordingly

## 2019-03-30 NOTE — Assessment & Plan Note (Signed)
She has significant bilateral lower extremity edema secondary to sedentary lifestyle right now from the bleeding and moderate protein calorie malnutrition Despite the leg edema, due to high risk of dehydration and kidney injury, I have discontinue her diuretic therapy We will monitor closely

## 2019-03-30 NOTE — H&P (Signed)
Chief Complaint: Patient was seen in consultation today for cervical cancer/Port-a-cath placement.  Referring Physician(s): [name]  Supervising Physician: Jacqulynn Cadet  Patient Status: [facility] - Out-pt  History of Present Illness: Tamara Osborne is a 44 y.o. female with a past medical history of hypertension, preeclampsia, CKD, cervical cancer, obesity, OSA, depression, and anxiety. She was unfortunately diagnosed with cervical cancer in 03/2019. Her cancer is managed by Dr. Alvy Bimler. She has tentative plans to begin systemic chemotherapy as management.  IR consulted by Dr. Alvy Bimler for possible image-guided Port-a-cath placement. Patient awake and alert laying in bed. Complains of pelvic pain, stable at this time. Denies fever, chills, chest pain, dyspnea, abdominal pain, or headache.   Past Medical History:  Diagnosis Date  . Anxiety   . Bartholin cyst   . Cervical cancer ([place])   . Chronic kidney disease   . Depression   . Deviated septum   . Hypertension   . Obesity   . Preeclampsia 2006  . Sleep apnea     Past Surgical History:  Procedure Laterality Date  . CESAREAN SECTION WITH BILATERAL TUBAL LIGATION  11/10/2004      . DILATION AND CURETTAGE OF UTERUS  1997    Allergies: Penicillins, Shellfish allergy, and Sulfa antibiotics  Medications: Prior to Admission medications   Medication Sig Start Date End Date Taking? Authorizing Provider  amLODipine (NORVASC) 10 MG tablet Take 1 tablet (10 mg total) by mouth daily. 06/28/16 03/30/19 Yes Wynona Luna, MD  cholecalciferol (VITAMIN D3) 25 MCG (1000 UNIT) tablet Take 1,000 Units by mouth daily.   Yes [provider]  cloNIDine (CATAPRES) 0.1 MG tablet Take 1 tablet (0.1 mg total) by mouth 2 (two) times daily as needed (blood pressure >180/110). 06/28/16  Yes Wynona Luna, MD  senna-docusate (SENOKOT-S) 8.6-50 MG tablet Take 2 tablets by mouth at bedtime. To prevent constipation, do not  take if having loose stools 03/24/19  Yes Cross, Melissa D, NP  fluticasone (FLONASE) 50 MCG/ACT nasal spray Place 1 spray into both nostrils daily as needed for allergies or rhinitis.    [provider]  hydrOXYzine (ATARAX/VISTARIL) 25 MG tablet Take 1 tablet (25 mg total) by mouth every 6 (six) hours as needed for anxiety. 03/25/19   Domenic Moras, PA-C  lidocaine-prilocaine (EMLA) cream Apply to affected area once 03/29/19   [name], MD  ondansetron (ZOFRAN) 8 MG tablet Take 1 tablet (8 mg total) by mouth every 8 (eight) hours as needed. 03/29/19   [name], MD  prochlorperazine (COMPAZINE) 10 MG tablet Take 1 tablet (10 mg total) by mouth every 6 (six) hours as needed (Nausea or vomiting). 03/29/19   [name], MD     Family History  Problem Relation Age of Onset  . Brain cancer Mother        lung cancer to brain  . Diabetes Father   . Kidney disease Father   . Cancer Father        kidney cancer  . Heart failure Sister     Social History   Socioeconomic History  . Marital status: Single    Spouse name: Not on file  . Number of children: 4  . Years of education: Not on file  . Highest education level: Not on file  Occupational History  . Not on file  Tobacco Use  . Smoking status: Never Smoker  . Smokeless tobacco: Never Used  Substance and Sexual Activity  . Alcohol use: No  . Drug use:  No  . Sexual activity: Not Currently    Birth control/protection: Surgical  Other Topics Concern  . Not on file  Social History Narrative   Lives with her boyfriend   Social Determinants of Health   Financial Resource Strain:   . Difficulty of Paying Living Expenses: Not on file  Food Insecurity:   . Worried About Charity fundraiser in the Last Year: Not on file  . Ran Out of Food in the Last Year: Not on file  Transportation Needs:   . Lack of Transportation (Medical): Not on file  . Lack of Transportation (Non-Medical): Not on file  Physical Activity:   . Days of  Exercise per Week: Not on file  . Minutes of Exercise per Session: Not on file  Stress:   . Feeling of Stress : Not on file  Social Connections:   . Frequency of Communication with Friends and Family: Not on file  . Frequency of Social Gatherings with Friends and Family: Not on file  . Attends Religious Services: Not on file  . Active Member of Clubs or Organizations: Not on file  . Attends Archivist Meetings: Not on file  . Marital Status: Not on file     Review of Systems: A 12 point ROS discussed and pertinent positives are indicated in the HPI above.  All other systems are negative.  Review of Systems  Constitutional: Negative for chills and fever.  Respiratory: Negative for shortness of breath and wheezing.   Cardiovascular: Negative for chest pain and palpitations.  Gastrointestinal: Negative for abdominal pain.  Genitourinary: Positive for pelvic pain.  Neurological: Negative for headaches.  Psychiatric/Behavioral: Negative for behavioral problems and confusion.    Vital Signs: LMP 03/14/2019 (Exact Date)   Physical Exam Vitals and nursing note reviewed.  Constitutional:      General: She is not in acute distress.    Appearance: Normal appearance.  Cardiovascular:     Rate and Rhythm: Normal rate and regular rhythm.     Heart sounds: Normal heart sounds. No murmur.  Pulmonary:     Effort: Pulmonary effort is normal. No respiratory distress.     Breath sounds: Normal breath sounds. No wheezing.  Skin:    General: Skin is warm and dry.  Neurological:     Mental Status: She is alert and oriented to person, place, and time.  Psychiatric:        Mood and Affect: Mood normal.        Behavior: Behavior normal.      MD Evaluation Airway: WNL Heart: WNL Abdomen: WNL Chest/ Lungs: WNL ASA  Classification: 3 Mallampati/Airway Score: Two   Imaging: US Transvaginal Non-OB  Result Date: 03/16/2019 SEE PROGRESS NOTE  NM PET Image Initial (PI) Skull  Base To Thigh  Result Date: 03/28/2019 CLINICAL DATA:  Initial treatment strategy for cervical cancer. EXAM: NUCLEAR MEDICINE PET SKULL BASE TO THIGH TECHNIQUE: 11.1 mCi F-18 FDG was injected intravenously. Full-ring PET imaging was performed from the skull base to thigh after the radiotracer. CT data was obtained and used for attenuation correction and anatomic localization. Fasting blood glucose: 98 mg/dl COMPARISON:  Transvaginal ultrasound 03/16/2019 2 FINDINGS: Mediastinal blood pool activity: SUV max 2.4 Liver activity: SUV max NA NECK: No significant abnormal hypermetabolic activity in this region. Incidental CT findings: none CHEST: 0.7 cm ground-glass density nodule in the left lower lobe on image 116/3. Adjacent 1.2 cm ground-glass density nodule in the left lower lobe on image 112/3. Subtle  ground-glass density in the superior segment right lower lobe on image 93/3, difficult to measure due to indistinct margins. No significant associated accentuated metabolic activity. Incidental CT findings: Moderate cardiomegaly. Mild descending thoracic aortic atherosclerotic calcification. ABDOMEN/PELVIS: Hypermetabolic cervical mass noted, maximum SUV 23.9. Associated abnormal metabolic activity measures approximately 7.6 by 6.9 by 4.9 cm. No pathologically enlarged or hypermetabolic adenopathy observed in the abdomen or pelvis Incidental CT findings: Extending cephalad and anterior to the uterine fundus, and 11.3 by 8.3 by 9.2 cm (volume = 450 cm^3) simple fluid density structure is present. This appears photopenic on the PET-CT. This may minimally abut the right adnexa but appears separate from the left ovary/adnexa. Aortoiliac atherosclerotic vascular disease. SKELETON: Symmetric low-grade activity throughout the skeleton without any corresponding lesion on the CT data, likely from granulocyte stimulation. Incidental CT findings: none IMPRESSION: 1. Hypermetabolic cervical mass measures roughly 7.6 by 6.9 by  4.9 cm, compatible with malignancy. No adenopathy or distant metastatic spread identified. 2. Extending cephalad and anterior to the uterine fundus, there is a 450 cubic cm simple fluid density structure without hypermetabolic activity. This may represent a right adnexal cyst (favored), peritoneal inclusion cyst, or (if the patient has a remote history of pancreatitis) a remote pseudocyst. 3. Faint ground density nodularity in both lower lobes which could be from atypical infection or extrinsic allergic alveolitis. Given nodular appearance of some of this ground-glass density, I would recommend follow up chest CT in 3 months time in order to ensure clearance. 4. Moderate cardiomegaly, cause uncertain. The patient also has advanced for age/gender atherosclerosis. Aortic Atherosclerosis (ICD10-I70.0). Electronically Signed   By: Van Clines M.D.   On: 03/28/2019 15:22    Labs:  CBC: Recent Labs    03/27/19 0946 03/27/19 2207 03/28/19 0850 03/30/19 0931  WBC 11.5* 13.5* 10.7* 10.6*  HGB 6.4* 8.6* 7.4* 9.1*  HCT 21.6* 27.4* 23.5* 28.8*  PLT 423* 465* 361 334    COAGS: No results for input(s): INR, APTT in the last 8760 hours.  BMP: Recent Labs    03/25/19 1701 03/27/19 0946 03/27/19 2207 03/28/19 0850  NA 135 138 136 139  K 3.7 4.1 3.6 3.9  CL 101 104 102 105  CO2 23 24 24 23   GLUCOSE 106* 94 142* 105*  BUN 12 17 23* 19  CALCIUM 8.8* 8.8* 8.6* 8.5*  CREATININE 1.35* 1.43* 1.70* 1.35*  GFRNONAA 48* 45* 37* 48*  GFRAA 56* 52* 42* 56*    LIVER FUNCTION TESTS: Recent Labs    03/22/19 2135 03/27/19 0946 03/28/19 0850  BILITOT 0.4 0.3 0.6  AST 15 18 17   ALT 14 23 24   ALKPHOS 51 49 44  PROT 6.9 7.5 6.9  ALBUMIN 3.0* 2.8* 2.6*     Assessment and Plan:  Cervical cancer with tentative plans to begin systemic chemotherapy. Plan for image-guided Port-a-cath placement today in IR. Patient is NPO. Afebrile. She does not take blood thinners.  Risks and benefits of  image-guided Port-a-catheter placement were discussed with the patient including, but not limited to bleeding, infection, pneumothorax, or fibrin sheath development and need for additional procedures. All of the patient's questions were answered, patient is agreeable to proceed. Consent signed and in chart.   Thank you for this interesting consult.  I greatly enjoyed meeting Sharron Flory and look forward to participating in their care.  A copy of this report was sent to the requesting provider on this date.  Electronically Signed: Earley Abide, PA-C 03/30/2019, 1:10 PM   I  spent a total of 30 Minutes in face to face in clinical consultation, greater than 50% of which was counseling/coordinating care for cervical cancer/Port-a-cath placement.

## 2019-03-30 NOTE — Assessment & Plan Note (Signed)
She has chronic kidney disease stage III likely secondary to chronic hypertension I will modify the dose of cisplatin I spent a lot of time educating the patient and her sister the importance of adequate hydration I have discontinue her diuretic therapy

## 2019-03-30 NOTE — Progress Notes (Signed)
Patient decided to postpone port placement as she would like to obtain more information.  Maryln Manuel, PA notified who spoke with patient, Dr. Laurence Ferrari notified who also spoke with patient. Patient states she will contact Dr. Alvy Bimler tomorrow to discuss options.  Patient given Interventional Radiology contact information to follow up with any questions or concerns.  Port placement cancelled.

## 2019-03-30 NOTE — Telephone Encounter (Signed)
Sharyn Lull (sister) called and said [facility] did not get her port today.  An IV was attempted in her left forearm and was painful and triggered her anxiety.  She was wondering why they did not know she was a hard stick.  Umme also overheard other patients having problems with their ports.  She was advised about the potential side effect/risk of bleeding to death with the placement and decided it was too overwhelming at this time.  Discussed that she can still get chemotherapy tomorrow through a peripheral IV and that the IV team can be contacted to place the IV.  Sharyn Lull talked to [place] while on the phone and [place] said that would be ok.  Advised her that Dr. Alvy Bimler will be notified and that we will get back with her with the plan to reschedule the port placement.

## 2019-03-30 NOTE — Telephone Encounter (Signed)
Called Sharyn Lull (sister) with infusion appointment for tomorrow at 7:30 am.  Advised her that radiation will work Research scientist (life sciences) in while she has her pre-chemo fluids going.  She verbalized agreement and said that they need to buy a BP machine and were advised by [place] to see if Dr. Alvy Bimler can put in an order so that it can be covered by her grant.  Advised her that Dr. Alvy Bimler will be notified.  Also left a message for [facility] with appointment details for tomorrow.

## 2019-03-31 ENCOUNTER — Telehealth: Payer: Self-pay

## 2019-03-31 ENCOUNTER — Other Ambulatory Visit: Payer: Self-pay

## 2019-03-31 ENCOUNTER — Ambulatory Visit
Admission: RE | Admit: 2019-03-31 | Discharge: 2019-03-31 | Disposition: A | Discharge status: Home / Self Care | Payer: Medicaid Other | Source: Ambulatory Visit | Attending: Radiation Oncology | Admitting: Radiation Oncology

## 2019-03-31 ENCOUNTER — Other Ambulatory Visit: Payer: Self-pay | Admitting: *Deleted

## 2019-03-31 ENCOUNTER — Inpatient Hospital Stay: Payer: Medicaid Other

## 2019-03-31 ENCOUNTER — Other Ambulatory Visit: Payer: Self-pay | Admitting: Hematology and Oncology

## 2019-03-31 DIAGNOSIS — F411 Generalized anxiety disorder: Secondary | ICD-10-CM

## 2019-03-31 DIAGNOSIS — Z510 Encounter for antineoplastic radiation therapy: Secondary | ICD-10-CM | POA: Diagnosis not present

## 2019-03-31 DIAGNOSIS — C539 Malignant neoplasm of cervix uteri, unspecified: Secondary | ICD-10-CM

## 2019-03-31 MED ORDER — LORAZEPAM 0.5 MG PO TABS: 0.5000 mg | ORAL_TABLET | Freq: Three times a day (TID) | ORAL | 0 refills | Status: DC | PRN

## 2019-03-31 MED FILL — OMRON 3 SERIES BP MONITOR D: 30 days supply | Qty: 1 | Fill #0

## 2019-03-31 MED FILL — LORazepam 0.5 MG TABS: 0.5 | 10 days supply | Qty: 30 | Fill #0

## 2019-03-31 NOTE — Telephone Encounter (Signed)
She cannot have cisplatin without port. It will burn her remaining veins I will try to contact infusion room to cancel her appt

## 2019-03-31 NOTE — Telephone Encounter (Signed)
Patient refused port placement yesterday in IR. She is agreeable to reschedule port in order to get started on chemotherapy. Given Rx for Ativan po from Dr. Alvy Bimler to take for anxiety, may take one prior to port placement. Port rescheduled for 2/2 Tuesday, arrive at 8 am, NPO after midnight. Talked with Tiffany, in IR about IV placement on the day of port placement. Patient is a hard IV stick and at the cancer center the IV team has been starting her IV. If nurse in [facility] is available in IR he could possibly start IV on the day of port placement.  Called and spoke with patient. Given appt details for port placement. She verbalized understanding. Instructed to call the office if needed for any questions.

## 2019-03-31 NOTE — Telephone Encounter (Signed)
Sister, Tamara Osborne called and left a message. Requesting another appt to talk with Dr. Alvy Bimler. Tamara Osborne is anxious about everything and overwhelmed with information. She felt comfortable with Dr. Alvy Bimler and would like to talk with her further about everything.   Called [facility] back. She is asking if another type of chemo can be given? A pill? What if she did not get chemotherapy and just radiation? She had chemo education on the same day as her blood on 1/27 and had benadryl. Now she cannot remember anything that was said in the class. Requesting another education class with her sister present.  Schedule appt for education class and Dr. Alvy Bimler to see Monday. She verbalized understanding to appts. Instructed to call the office if needed.

## 2019-04-03 ENCOUNTER — Inpatient Hospital Stay: Payer: Medicaid Other

## 2019-04-03 ENCOUNTER — Inpatient Hospital Stay: Payer: Medicaid Other | Attending: Hematology and Oncology | Admitting: Hematology and Oncology

## 2019-04-03 ENCOUNTER — Ambulatory Visit
Admission: RE | Admit: 2019-04-03 | Discharge: 2019-04-03 | Disposition: A | Discharge status: Home / Self Care | Payer: Medicaid Other | Source: Ambulatory Visit | Attending: Radiation Oncology | Admitting: Radiation Oncology

## 2019-04-03 ENCOUNTER — Telehealth: Payer: Self-pay | Admitting: Oncology

## 2019-04-03 ENCOUNTER — Other Ambulatory Visit: Payer: Self-pay

## 2019-04-03 ENCOUNTER — Other Ambulatory Visit: Payer: Self-pay | Admitting: Physician Assistant

## 2019-04-03 DIAGNOSIS — N939 Abnormal uterine and vaginal bleeding, unspecified: Secondary | ICD-10-CM

## 2019-04-03 DIAGNOSIS — Z5111 Encounter for antineoplastic chemotherapy: Secondary | ICD-10-CM | POA: Insufficient documentation

## 2019-04-03 DIAGNOSIS — Z79899 Other long term (current) drug therapy: Secondary | ICD-10-CM | POA: Diagnosis not present

## 2019-04-03 DIAGNOSIS — I700 Atherosclerosis of aorta: Secondary | ICD-10-CM | POA: Insufficient documentation

## 2019-04-03 DIAGNOSIS — D649 Anemia, unspecified: Secondary | ICD-10-CM | POA: Insufficient documentation

## 2019-04-03 DIAGNOSIS — F411 Generalized anxiety disorder: Secondary | ICD-10-CM | POA: Diagnosis not present

## 2019-04-03 DIAGNOSIS — N183 Chronic kidney disease, stage 3 unspecified: Secondary | ICD-10-CM | POA: Diagnosis not present

## 2019-04-03 DIAGNOSIS — R197 Diarrhea, unspecified: Secondary | ICD-10-CM | POA: Insufficient documentation

## 2019-04-03 DIAGNOSIS — C539 Malignant neoplasm of cervix uteri, unspecified: Secondary | ICD-10-CM | POA: Diagnosis not present

## 2019-04-03 DIAGNOSIS — Z510 Encounter for antineoplastic radiation therapy: Secondary | ICD-10-CM | POA: Diagnosis not present

## 2019-04-03 LAB — COMPREHENSIVE METABOLIC PANEL
ALT: 20 U/L (ref 0–44)
AST: 9 U/L — ABNORMAL LOW (ref 15–41)
Albumin: 2.5 g/dL — ABNORMAL LOW (ref 3.5–5.0)
Alkaline Phosphatase: 56 U/L (ref 38–126)
Anion gap: 9 (ref 5–15)
BUN: 13 mg/dL (ref 6–20)
CO2: 23 mmol/L (ref 22–32)
Calcium: 8.8 mg/dL — ABNORMAL LOW (ref 8.9–10.3)
Chloride: 105 mmol/L (ref 98–111)
Creatinine, Ser: 1.26 mg/dL — ABNORMAL HIGH (ref 0.44–1.00)
GFR calc Af Amer: >60 mL/min (ref >60)
GFR calc non Af Amer: 53 mL/min — ABNORMAL LOW (ref >60)
Glucose, Bld: 111 mg/dL — ABNORMAL HIGH (ref 70–99)
Potassium: 4.2 mmol/L (ref 3.5–5.1)
Sodium: 137 mmol/L (ref 135–145)
Total Bilirubin: 0.3 mg/dL (ref 0.3–1.2)
Total Protein: 7 g/dL (ref 6.5–8.1)

## 2019-04-03 LAB — CBC WITH DIFFERENTIAL/PLATELET
Abs Immature Granulocytes: 0.08 10*3/uL — ABNORMAL HIGH (ref 0.00–0.07)
Basophils Absolute: 0 10*3/uL (ref 0.0–0.1)
Basophils Relative: 0 %
Eosinophils Absolute: 0.2 10*3/uL (ref 0.0–0.5)
Eosinophils Relative: 2 %
HCT: 27.9 % — ABNORMAL LOW (ref 36.0–46.0)
Hemoglobin: 8.8 g/dL — ABNORMAL LOW (ref 12.0–15.0)
Immature Granulocytes: 1 %
Lymphocytes Relative: 4 %
Lymphs Abs: 0.4 10*3/uL — ABNORMAL LOW (ref 0.7–4.0)
MCH: 26.2 pg (ref 26.0–34.0)
MCHC: 31.5 g/dL (ref 30.0–36.0)
MCV: 83 fL (ref 80.0–100.0)
Monocytes Absolute: 0.6 10*3/uL (ref 0.1–1.0)
Monocytes Relative: 6 %
Neutro Abs: 8.6 10*3/uL — ABNORMAL HIGH (ref 1.7–7.7)
Neutrophils Relative %: 87 %
Platelets: 262 10*3/uL (ref 150–400)
RBC: 3.36 MIL/uL — ABNORMAL LOW (ref 3.87–5.11)
RDW: 18.3 % — ABNORMAL HIGH (ref 11.5–15.5)
WBC: 10 10*3/uL (ref 4.0–10.5)
nRBC: 0 % (ref 0.0–0.2)

## 2019-04-03 LAB — MAGNESIUM: Magnesium: 2 mg/dL (ref 1.7–2.4)

## 2019-04-03 LAB — SAMPLE TO BLOOD BANK

## 2019-04-03 NOTE — Telephone Encounter (Signed)
Left a message for Gwinda Maine, CSW to contact patient.

## 2019-04-03 NOTE — Telephone Encounter (Signed)
Tamara Osborne called and said she did not feel well over the weekend which scared her.  She had stomach cramps, diarrhea and vaginal bleeding.  She said the bleeding is like a period with some clots.  It is not as heavy as it was when she first came in.    She thought she had labs this morning so she is here for her appointments.  Asked if she would like to move her lab and radiation appointments to this morning and she agreed.  Called Candace, RT on [place] [address] and patient can be worked in for radiation. Also reviewed appointment times for patient education today and follow up with Dr. Alvy Bimler.

## 2019-04-04 ENCOUNTER — Encounter: Payer: Self-pay | Admitting: General Practice

## 2019-04-04 ENCOUNTER — Inpatient Hospital Stay: Payer: Medicaid Other | Admitting: Hematology and Oncology

## 2019-04-04 ENCOUNTER — Ambulatory Visit (HOSPITAL_COMMUNITY)
Admission: RE | Admit: 2019-04-04 | Discharge: 2019-04-04 | Disposition: A | Discharge status: Home / Self Care | Payer: Medicaid Other | Source: Ambulatory Visit | Attending: Hematology and Oncology | Admitting: Hematology and Oncology

## 2019-04-04 ENCOUNTER — Encounter: Payer: Self-pay | Admitting: Hematology and Oncology

## 2019-04-04 ENCOUNTER — Ambulatory Visit
Admission: RE | Admit: 2019-04-04 | Discharge: 2019-04-04 | Disposition: A | Discharge status: Home / Self Care | Payer: Medicaid Other | Source: Ambulatory Visit | Attending: Radiation Oncology | Admitting: Radiation Oncology

## 2019-04-04 ENCOUNTER — Other Ambulatory Visit: Payer: Self-pay

## 2019-04-04 ENCOUNTER — Encounter (HOSPITAL_COMMUNITY): Payer: Self-pay

## 2019-04-04 ENCOUNTER — Encounter: Payer: Self-pay | Admitting: Oncology

## 2019-04-04 DIAGNOSIS — I129 Hypertensive chronic kidney disease with stage 1 through stage 4 chronic kidney disease, or unspecified chronic kidney disease: Secondary | ICD-10-CM | POA: Insufficient documentation

## 2019-04-04 DIAGNOSIS — C539 Malignant neoplasm of cervix uteri, unspecified: Secondary | ICD-10-CM | POA: Diagnosis present

## 2019-04-04 DIAGNOSIS — Z9851 Tubal ligation status: Secondary | ICD-10-CM | POA: Diagnosis not present

## 2019-04-04 DIAGNOSIS — Z79899 Other long term (current) drug therapy: Secondary | ICD-10-CM | POA: Insufficient documentation

## 2019-04-04 DIAGNOSIS — Z882 Allergy status to sulfonamides status: Secondary | ICD-10-CM | POA: Diagnosis not present

## 2019-04-04 DIAGNOSIS — G4730 Sleep apnea, unspecified: Secondary | ICD-10-CM | POA: Insufficient documentation

## 2019-04-04 DIAGNOSIS — R197 Diarrhea, unspecified: Secondary | ICD-10-CM | POA: Insufficient documentation

## 2019-04-04 DIAGNOSIS — E669 Obesity, unspecified: Secondary | ICD-10-CM | POA: Diagnosis not present

## 2019-04-04 DIAGNOSIS — N189 Chronic kidney disease, unspecified: Secondary | ICD-10-CM | POA: Insufficient documentation

## 2019-04-04 DIAGNOSIS — Z880 Allergy status to penicillin: Secondary | ICD-10-CM | POA: Diagnosis not present

## 2019-04-04 DIAGNOSIS — Z6837 Body mass index (BMI) 37.0-37.9, adult: Secondary | ICD-10-CM | POA: Insufficient documentation

## 2019-04-04 DIAGNOSIS — Z510 Encounter for antineoplastic radiation therapy: Secondary | ICD-10-CM | POA: Diagnosis not present

## 2019-04-04 HISTORY — PX: IR IMAGING GUIDED PORT INSERTION: IMG5740

## 2019-04-04 LAB — GLUCOSE, CAPILLARY: Glucose-Capillary: 100 mg/dL — ABNORMAL HIGH (ref 70–99)

## 2019-04-04 MED ORDER — MIDAZOLAM HCL 2 MG/2ML IJ SOLN
INTRAMUSCULAR | Status: AC | PRN
  Administered 2019-04-04 (×3): 1 mg via INTRAVENOUS

## 2019-04-04 MED ORDER — CLINDAMYCIN PHOSPHATE 900 MG/50ML IV SOLN
INTRAVENOUS | Status: AC
  Administered 2019-04-04: 10:00:00 900 mg
  Filled 2019-04-04: qty 50

## 2019-04-04 MED ORDER — LIDOCAINE-EPINEPHRINE (PF) 2 %-1:200000 IJ SOLN
INTRAMUSCULAR | Status: AC
  Filled 2019-04-04: qty 20

## 2019-04-04 MED ORDER — SODIUM CHLORIDE 0.9 % IV SOLN: INTRAVENOUS | Status: DC

## 2019-04-04 MED ORDER — FENTANYL CITRATE (PF) 100 MCG/2ML IJ SOLN
INTRAMUSCULAR | Status: AC | PRN
  Administered 2019-04-04 (×2): 50 ug via INTRAVENOUS

## 2019-04-04 MED ORDER — VANCOMYCIN HCL IN DEXTROSE 1-5 GM/200ML-% IV SOLN: 1000.0000 mg | Freq: Once | INTRAVENOUS | Status: DC

## 2019-04-04 MED ORDER — MIDAZOLAM HCL 2 MG/2ML IJ SOLN
INTRAMUSCULAR | Status: AC | PRN
  Administered 2019-04-04: 1 mg via INTRAVENOUS

## 2019-04-04 MED ORDER — MIDAZOLAM HCL 2 MG/2ML IJ SOLN
INTRAMUSCULAR | Status: AC
  Filled 2019-04-04: qty 4

## 2019-04-04 MED ORDER — LIDOCAINE-EPINEPHRINE (PF) 1 %-1:200000 IJ SOLN
INTRAMUSCULAR | Status: AC | PRN
  Administered 2019-04-04: 20 mL

## 2019-04-04 MED ORDER — VANCOMYCIN HCL IN DEXTROSE 1-5 GM/200ML-% IV SOLN
INTRAVENOUS | Status: AC
  Filled 2019-04-04: qty 200

## 2019-04-04 MED ORDER — FENTANYL CITRATE (PF) 100 MCG/2ML IJ SOLN
INTRAMUSCULAR | Status: AC
  Filled 2019-04-04: qty 2

## 2019-04-04 MED ORDER — HEPARIN SOD (PORK) LOCK FLUSH 100 UNIT/ML IV SOLN
INTRAVENOUS | Status: AC
  Filled 2019-04-04: qty 5

## 2019-04-04 NOTE — Assessment & Plan Note (Signed)
A big component of her problem is generalized anxiety disorder She will try to take lorazepam before her port placement We will also consult our social worker to provide counseling

## 2019-04-04 NOTE — Assessment & Plan Note (Signed)
Likely secondary to side effects of treatment We discussed the use of Imodium and hydration

## 2019-04-04 NOTE — Assessment & Plan Note (Signed)
She missed her treatment last week due to inability to get the port I explained to the patient and her sister again the rationale of why surgery is not indicated and the role of concurrent chemoradiation therapy with cisplatin as chemo sensitizing agent There is no effective pill type chemotherapy for this The port placement is not just for chemotherapy but also for blood transfusion as well as IV fluid support, especially given her poor venous access situation After extensive discussion, she is willing to proceed with port placement as scheduled She will proceed with chemotherapy as scheduled

## 2019-04-04 NOTE — Progress Notes (Signed)
Tamara Osborne OFFICE PROGRESS NOTE  Patient Care Team: Pllc, [facility] as PCP - General (Family Medicine)  ASSESSMENT & PLAN:  Squamous cell carcinoma of cervix ([place]) She missed her treatment last week due to inability to get the port I explained to the patient and her sister again the rationale of why surgery is not indicated and the role of concurrent chemoradiation therapy with cisplatin as chemo sensitizing agent There is no effective pill type chemotherapy for this The port placement is not just for chemotherapy but also for blood transfusion as well as IV fluid support, especially given her poor venous access situation After extensive discussion, she is willing to proceed with port placement as scheduled She will proceed with chemotherapy as scheduled  Abnormal vaginal bleeding Her vaginal bleeding is slightly better She is still anemic but she does not need transfusion support for now We will continue to check her blood on the weekly basis  CKD (chronic kidney disease), symptom management only, stage 3 (moderate) Her kidney function has improved dramatically She will continue hydration as tolerated  Diarrhea Likely secondary to side effects of treatment We discussed the use of Imodium and hydration  Generalized anxiety disorder A big component of her problem is generalized anxiety disorder She will try to take lorazepam before her port placement We will also consult our social worker to provide counseling   No orders of the defined types were placed in this encounter.   All questions were answered. The patient knows to call the clinic with any problems, questions or concerns. The total time spent in the appointment was 30 minutes encounter with patients including review of chart and various tests results, discussions about plan of care and coordination of care plan   [name], MD 04/04/2019 7:25 AM  INTERVAL HISTORY: Please see below for  problem oriented charting. Her sister is present with her throughout the visit She returns with her sister for further discussion about plan of care The patient had to change her mind last week when she presented to get her port placed She started panicking when the physician counseled her about the risk of port placement She start wondering whether she should not proceed with chemotherapy and requested whether the pill can be just as effective to treat her cancer She started to have some loose watery bowel movement over the weekend but she is able to hydrate herself She felt that her menstruation has started and she started to have some pelvic cramping She have mild occasional discomfort when she urinate She denies fever or chills She was prescribed lorazepam to take as needed for anxiety but she has not started that yet Her energy level is fair  SUMMARY OF ONCOLOGIC HISTORY: Oncology History  Squamous cell carcinoma of cervix ([place])  03/21/2019 Pathology Results   The biopsy is superficial and therefore depth of invasion cannot be  determined.  There is at least carcinoma in-situ.    03/23/2019 Initial Diagnosis   Squamous cell carcinoma of cervix ([place])   03/23/2019 Pathology Results   CERVIX, 11 O CLOCK, BIOPSY:  -  Squamous cell carcinoma, invasive  -  See comment   03/28/2019 PET scan   1. Hypermetabolic cervical mass measures roughly 7.6 by 6.9 by 4.9 cm, compatible with malignancy. No adenopathy or distant metastatic spread identified. 2. Extending cephalad and anterior to the uterine fundus, there is a 450 cubic cm simple fluid density structure without hypermetabolic activity. This may represent a right adnexal  cyst (favored), peritoneal inclusion cyst, or (if the patient has a remote history of pancreatitis) a remote pseudocyst. 3. Faint ground density nodularity in both lower lobes which could be from atypical infection or extrinsic allergic alveolitis. Given nodular appearance  of some of this ground-glass density, I would recommend follow up chest CT in 3 months time in order to ensure clearance. 4. Moderate cardiomegaly, cause uncertain. The patient also has advanced for age/gender atherosclerosis. Aortic Atherosclerosis (ICD10-I70.0).       REVIEW OF SYSTEMS:   Constitutional: Denies fevers, chills or abnormal weight loss Eyes: Denies blurriness of vision Ears, nose, mouth, throat, and face: Denies mucositis or sore throat Respiratory: Denies cough, dyspnea or wheezes Cardiovascular: Denies palpitation, chest discomfort or lower extremity swelling Skin: Denies abnormal skin rashes Lymphatics: Denies new lymphadenopathy or easy bruising Neurological:Denies numbness, tingling or new weaknesses Behavioral/Psych: Mood is stable, no new changes  All other systems were reviewed with the patient and are negative.  I have reviewed the past medical history, past surgical history, social history and family history with the patient and they are unchanged from previous note.  ALLERGIES:  is allergic to penicillins; shellfish allergy; and sulfa antibiotics.  MEDICATIONS:  Current Outpatient Medications  Medication Sig Dispense Refill  . amLODipine (NORVASC) 10 MG tablet Take 1 tablet (10 mg total) by mouth daily. 90 tablet 0  . cholecalciferol (VITAMIN D3) 25 MCG (1000 UNIT) tablet Take 1,000 Units by mouth daily.    . cloNIDine (CATAPRES) 0.1 MG tablet Take 1 tablet (0.1 mg total) by mouth 2 (two) times daily as needed (blood pressure >180/110). 50 tablet 0  . fluticasone (FLONASE) 50 MCG/ACT nasal spray Place 1 spray into both nostrils daily as needed for allergies or rhinitis.    . hydrOXYzine (ATARAX/VISTARIL) 25 MG tablet Take 1 tablet (25 mg total) by mouth every 6 (six) hours as needed for anxiety. 20 tablet 0  . lidocaine-prilocaine (EMLA) cream Apply to affected area once 30 g 3  . LORazepam (ATIVAN) 0.5 MG tablet Take 1 tablet (0.5 mg total) by mouth every 8  (eight) hours as needed for anxiety. 30 tablet 0  . ondansetron (ZOFRAN) 8 MG tablet Take 1 tablet (8 mg total) by mouth every 8 (eight) hours as needed. 30 tablet 1  . prochlorperazine (COMPAZINE) 10 MG tablet Take 1 tablet (10 mg total) by mouth every 6 (six) hours as needed (Nausea or vomiting). 30 tablet 1  . senna-docusate (SENOKOT-S) 8.6-50 MG tablet Take 2 tablets by mouth at bedtime. To prevent constipation, do not take if having loose stools 60 tablet 1   No current facility-administered medications for this visit.    PHYSICAL EXAMINATION: ECOG PERFORMANCE STATUS: 1 - Symptomatic but completely ambulatory  Vitals:   04/03/19 1306  BP: (!) 153/74  Pulse: (!) 104  Resp: 18  Temp: 98.2 F (36.8 C)  SpO2: 100%   Filed Weights   04/03/19 1306  Weight: 203 lb 6.4 oz (92.3 kg)    GENERAL:alert, no distress and comfortable Musculoskeletal:no cyanosis of digits and no clubbing  NEURO: alert & oriented x 3 with fluent speech, no focal motor/sensory deficits  LABORATORY DATA:  I have reviewed the data as listed    Component Value Date/Time   NA 137 04/03/2019 0931   K 4.2 04/03/2019 0931   CL 105 04/03/2019 0931   CO2 23 04/03/2019 0931   GLUCOSE 111 (H) 04/03/2019 0931   BUN 13 04/03/2019 0931   CREATININE 1.26 (H) 04/03/2019  [id number]   CALCIUM 8.8 (L) 04/03/2019 0931   PROT 7.0 04/03/2019 0931   ALBUMIN 2.5 (L) 04/03/2019 0931   AST 9 (L) 04/03/2019 0931   ALT 20 04/03/2019 0931   ALKPHOS 56 04/03/2019 0931   BILITOT 0.3 04/03/2019 0931   GFRNONAA 53 (L) 04/03/2019 0931   GFRAA >60 04/03/2019 0931    No results found for: SPEP, UPEP  Lab Results  Component Value Date   WBC 10.0 04/03/2019   NEUTROABS 8.6 (H) 04/03/2019   HGB 8.8 (L) 04/03/2019   HCT 27.9 (L) 04/03/2019   MCV 83.0 04/03/2019   PLT 262 04/03/2019      Chemistry      Component Value Date/Time   NA 137 04/03/2019 0931   K 4.2 04/03/2019 0931   CL 105 04/03/2019 0931   CO2 23 04/03/2019  0931   BUN 13 04/03/2019 0931   CREATININE 1.26 (H) 04/03/2019 0931      Component Value Date/Time   CALCIUM 8.8 (L) 04/03/2019 0931   ALKPHOS 56 04/03/2019 0931   AST 9 (L) 04/03/2019 0931   ALT 20 04/03/2019 0931   BILITOT 0.3 04/03/2019 0931       RADIOGRAPHIC STUDIES: I have personally reviewed the radiological images as listed and agreed with the findings in the report. US Transvaginal Non-OB  Result Date: 03/16/2019 SEE PROGRESS NOTE  NM PET Image Initial (PI) Skull Base To Thigh  Result Date: 03/28/2019 CLINICAL DATA:  Initial treatment strategy for cervical cancer. EXAM: NUCLEAR MEDICINE PET SKULL BASE TO THIGH TECHNIQUE: 11.1 mCi F-18 FDG was injected intravenously. Full-ring PET imaging was performed from the skull base to thigh after the radiotracer. CT data was obtained and used for attenuation correction and anatomic localization. Fasting blood glucose: 98 mg/dl COMPARISON:  Transvaginal ultrasound 03/16/2019 2 FINDINGS: Mediastinal blood pool activity: SUV max 2.4 Liver activity: SUV max NA NECK: No significant abnormal hypermetabolic activity in this region. Incidental CT findings: none CHEST: 0.7 cm ground-glass density nodule in the left lower lobe on image 116/3. Adjacent 1.2 cm ground-glass density nodule in the left lower lobe on image 112/3. Subtle ground-glass density in the superior segment right lower lobe on image 93/3, difficult to measure due to indistinct margins. No significant associated accentuated metabolic activity. Incidental CT findings: Moderate cardiomegaly. Mild descending thoracic aortic atherosclerotic calcification. ABDOMEN/PELVIS: Hypermetabolic cervical mass noted, maximum SUV 23.9. Associated abnormal metabolic activity measures approximately 7.6 by 6.9 by 4.9 cm. No pathologically enlarged or hypermetabolic adenopathy observed in the abdomen or pelvis Incidental CT findings: Extending cephalad and anterior to the uterine fundus, and 11.3 by 8.3 by  9.2 cm (volume = 450 cm^3) simple fluid density structure is present. This appears photopenic on the PET-CT. This may minimally abut the right adnexa but appears separate from the left ovary/adnexa. Aortoiliac atherosclerotic vascular disease. SKELETON: Symmetric low-grade activity throughout the skeleton without any corresponding lesion on the CT data, likely from granulocyte stimulation. Incidental CT findings: none IMPRESSION: 1. Hypermetabolic cervical mass measures roughly 7.6 by 6.9 by 4.9 cm, compatible with malignancy. No adenopathy or distant metastatic spread identified. 2. Extending cephalad and anterior to the uterine fundus, there is a 450 cubic cm simple fluid density structure without hypermetabolic activity. This may represent a right adnexal cyst (favored), peritoneal inclusion cyst, or (if the patient has a remote history of pancreatitis) a remote pseudocyst. 3. Faint ground density nodularity in both lower lobes which could be from atypical infection or extrinsic allergic alveolitis. Given nodular  appearance of some of this ground-glass density, I would recommend follow up chest CT in 3 months time in order to ensure clearance. 4. Moderate cardiomegaly, cause uncertain. The patient also has advanced for age/gender atherosclerosis. Aortic Atherosclerosis (ICD10-I70.0). Electronically Signed   By: Van Clines M.D.   On: 03/28/2019 15:22

## 2019-04-04 NOTE — Progress Notes (Signed)
[place] Note  Left voicemail on behalf of Patient and Family Support Team, encouraging callback, per referral from navigator Santiago Glad Hess/RN for emotional support.   [place], [place], [facility] Pager [phone] Voicemail [phone]

## 2019-04-04 NOTE — Assessment & Plan Note (Signed)
Her vaginal bleeding is slightly better She is still anemic but she does not need transfusion support for now We will continue to check her blood on the weekly basis

## 2019-04-04 NOTE — Assessment & Plan Note (Signed)
Her kidney function has improved dramatically She will continue hydration as tolerated

## 2019-04-04 NOTE — Progress Notes (Signed)
Referring Physician(s): [name]  Supervising Physician: Sandi Mariscal  Patient Status:  [facility] OP  Chief Complaint:  "I'm here for a port a cath"  Subjective: Patient familiar to IR service from recent evaluation for Port-A-Cath placement on 03/30/2019.  At that time patient refused port placement.  She has a history of poor venous access/recently diagnosed cervical cancer and presents again today for Port-A-Cath placement  for chemotherapy.  She currently denies fever, headache, chest pain, dyspnea, cough, back pain, nausea, vomiting.  She does have some intermittent lower abdominal cramping and vaginal bleeding.  Past Medical History:  Diagnosis Date  . Anxiety   . Bartholin cyst   . Cervical cancer ([place])   . Chronic kidney disease   . Depression   . Deviated septum   . Hypertension   . Obesity   . Preeclampsia 2006  . Sleep apnea    Past Surgical History:  Procedure Laterality Date  . CESAREAN SECTION WITH BILATERAL TUBAL LIGATION  11/10/2004      . DILATION AND CURETTAGE OF UTERUS  1997      Allergies: Penicillins, Shellfish allergy, and Sulfa antibiotics  Medications: Prior to Admission medications   Medication Sig Start Date End Date Taking? Authorizing Provider  amLODipine (NORVASC) 10 MG tablet Take 1 tablet (10 mg total) by mouth daily. 06/28/16 04/04/19 Yes Wynona Luna, MD  cloNIDine (CATAPRES) 0.1 MG tablet Take 1 tablet (0.1 mg total) by mouth 2 (two) times daily as needed (blood pressure >180/110). 06/28/16  Yes Wynona Luna, MD  diphenhydramine-acetaminophen (TYLENOL PM) 25-500 MG TABS tablet Take 1 tablet by mouth at bedtime as needed.   Yes [provider]  loperamide (IMODIUM) 2 MG capsule Take by mouth as needed for diarrhea or loose stools.   Yes [provider]  cholecalciferol (VITAMIN D3) 25 MCG (1000 UNIT) tablet Take 1,000 Units by mouth daily.    [provider]  fluticasone (FLONASE) 50 MCG/ACT nasal  spray Place 1 spray into both nostrils daily as needed for allergies or rhinitis.    [provider]  hydrOXYzine (ATARAX/VISTARIL) 25 MG tablet Take 1 tablet (25 mg total) by mouth every 6 (six) hours as needed for anxiety. 03/25/19   Domenic Moras, PA-C  lidocaine-prilocaine (EMLA) cream Apply to affected area once 03/29/19   [name], MD  LORazepam (ATIVAN) 0.5 MG tablet Take 1 tablet (0.5 mg total) by mouth every 8 (eight) hours as needed for anxiety. 03/31/19   [name], MD  ondansetron (ZOFRAN) 8 MG tablet Take 1 tablet (8 mg total) by mouth every 8 (eight) hours as needed. 03/29/19   [name], MD  prochlorperazine (COMPAZINE) 10 MG tablet Take 1 tablet (10 mg total) by mouth every 6 (six) hours as needed (Nausea or vomiting). 03/29/19   [name], MD  senna-docusate (SENOKOT-S) 8.6-50 MG tablet Take 2 tablets by mouth at bedtime. To prevent constipation, do not take if having loose stools 03/24/19   [name] D, NP     Vital Signs: Blood pressure 153/74, heart rate 104, temperature 98.2, respirations 18, O2 sat 100% room air Ht 5\' 2"  (1.575 m)   Wt 203 lb 6.4 oz (92.3 kg)   LMP 03/14/2019 (Exact Date)   BMI 37.20 kg/m   Physical Exam awake, alert.  Chest clear to auscultation bilaterally.  Heart with regular rate and rhythm.  Abdomen soft, positive bowel sounds, mildly tender lower abdomen to palpation; no significant lower extremity edema. Imaging: No results found.  Labs:  CBC: Recent Labs    03/27/19 2207 03/28/19 0850 03/30/19 0931 04/03/19 0931  WBC 13.5* 10.7* 10.6* 10.0  HGB 8.6* 7.4* 9.1* 8.8*  HCT 27.4* 23.5* 28.8* 27.9*  PLT 465* 361 334 262    COAGS: No results for input(s): INR, APTT in the last 8760 hours.  BMP: Recent Labs    03/27/19 0946 03/27/19 2207 03/28/19 0850 04/03/19 0931  NA 138 136 139 137  K 4.1 3.6 3.9 4.2  CL 104 102 105 105  CO2 24 24 23 23   GLUCOSE 94 142* 105* 111*  BUN 17 23* 19 13  CALCIUM 8.8* 8.6* 8.5* 8.8*   CREATININE 1.43* 1.70* 1.35* 1.26*  GFRNONAA 45* 37* 48* 53*  GFRAA 52* 42* 56* >60    LIVER FUNCTION TESTS: Recent Labs    03/22/19 2135 03/27/19 0946 03/28/19 0850 04/03/19 0931  BILITOT 0.4 0.3 0.6 0.3  AST 15 18 17  9*  ALT 14 23 24 20   ALKPHOS 51 49 44 56  PROT 6.9 7.5 6.9 7.0  ALBUMIN 3.0* 2.8* 2.6* 2.5*    Assessment and Plan: Patient with history of newly diagnosed cervical cancer; presents today for Port-A-Cath placement for chemotherapy.Risks and benefits of image guided port-a-catheter placement was discussed with the patient including, but not limited to bleeding, infection, pneumothorax, or fibrin sheath development and need for additional procedures.  All of the patient's questions were answered, patient is agreeable to proceed. Consent signed and in chart.     Electronically Signed: D. Rowe Robert, PA-C 04/04/2019, 8:44 AM   I spent a total of 20 minutes at the the patient's bedside AND on the patient's hospital floor or unit, greater than 50% of which was counseling/coordinating care for Port-A-Cath placement    Patient ID: Tamara Osborne, female   DOB: 1975/12/27, 44 y.o.   MRN: [id number]

## 2019-04-04 NOTE — Discharge Instructions (Addendum)
Please call Interventional Radiology clinic [phone] with any questions about your port.  DO NOT use EMLA cream for 2 weeks after port placement as this cream will remove surgical glue on your incision.  You may remove your dressing and shower tomorrow.  Moderate Conscious Sedation, Adult, Care After These instructions provide you with information about caring for yourself after your procedure. Your health care provider may also give you more specific instructions. Your treatment has been planned according to current medical practices, but problems sometimes occur. Call your health care provider if you have any problems or questions after your procedure. What can I expect after the procedure? After your procedure, it is common:  To feel sleepy for several hours.  To feel clumsy and have poor balance for several hours.  To have poor judgment for several hours.  To vomit if you eat too soon. Follow these instructions at home: For at least 24 hours after the procedure:   Do not: ? Participate in activities where you could fall or become injured. ? Drive. ? Use heavy machinery. ? Drink alcohol. ? Take sleeping pills or medicines that cause drowsiness. ? Make important decisions or sign legal documents. ? Take care of children on your own.  Rest. Eating and drinking  Follow the diet recommended by your health care provider.  If you vomit: ? Drink water, juice, or soup when you can drink without vomiting. ? Make sure you have little or no nausea before eating solid foods. General instructions  Have a responsible adult stay with you until you are awake and alert.  Take over-the-counter and prescription medicines only as told by your health care provider.  If you smoke, do not smoke without supervision.  Keep all follow-up visits as told by your health care provider. This is important. Contact a health care provider if:  You keep feeling nauseous or you keep  vomiting.  You feel light-headed.  You develop a rash.  You have a fever. Get help right away if:  You have trouble breathing. This information is not intended to replace advice given to you by your health care provider. Make sure you discuss any questions you have with your health care provider. Document Revised: 01/29/2017 Document Reviewed: 06/08/2015 Elsevier Patient Education  [place] Insertion, Care After This sheet gives you information about how to care for yourself after your procedure. Your health care provider may also give you more specific instructions. If you have problems or questions, contact your health care provider. What can I expect after the procedure? After the procedure, it is common to have:  Discomfort at the port insertion site.  Bruising on the skin over the port. This should improve over 3-4 days. Follow these instructions at home: [facility] care  After your port is placed, you will get a manufacturer's information card. The card has information about your port. Keep this card with you at all times.  Take care of the port as told by your health care provider. Ask your health care provider if you or a family member can get training for taking care of the port at home. A home health care nurse may also take care of the port.  Make sure to remember what type of port you have. Incision care      Follow instructions from your health care provider about how to take care of your port insertion site. Make sure you: ? Wash your hands with soap and water before  and after you change your bandage (dressing). If soap and water are not available, use hand sanitizer. ? Change your dressing as told by your health care provider. ? Leave stitches (sutures), skin glue, or adhesive strips in place. These skin closures may need to stay in place for 2 weeks or longer. If adhesive strip edges start to loosen and curl up, you may trim the loose  edges. Do not remove adhesive strips completely unless your health care provider tells you to do that.  Check your port insertion site every day for signs of infection. Check for: ? Redness, swelling, or pain. ? Fluid or blood. ? Warmth. ? Pus or a bad smell. Activity  Return to your normal activities as told by your health care provider. Ask your health care provider what activities are safe for you.  Do not lift anything that is heavier than 10 lb (4.5 kg), or the limit that you are told, until your health care provider says that it is safe. General instructions  Take over-the-counter and prescription medicines only as told by your health care provider.  Do not take baths, swim, or use a hot tub until your health care provider approves. Ask your health care provider if you may take showers. You may only be allowed to take sponge baths.  Do not drive for 24 hours if you were given a sedative during your procedure.  Wear a medical alert bracelet in case of an emergency. This will tell any health care providers that you have a port.  Keep all follow-up visits as told by your health care provider. This is important. Contact a health care provider if:  You cannot flush your port with saline as directed, or you cannot draw blood from the port.  You have a fever or chills.  You have redness, swelling, or pain around your port insertion site.  You have fluid or blood coming from your port insertion site.  Your port insertion site feels warm to the touch.  You have pus or a bad smell coming from the port insertion site. Get help right away if:  You have chest pain or shortness of breath.  You have bleeding from your port that you cannot control. Summary  Take care of the port as told by your health care provider. Keep the manufacturer's information card with you at all times.  Change your dressing as told by your health care provider.  Contact a health care provider if you  have a fever or chills or if you have redness, swelling, or pain around your port insertion site.  Keep all follow-up visits as told by your health care provider. This information is not intended to replace advice given to you by your health care provider. Make sure you discuss any questions you have with your health care provider. Document Revised: 09/14/2017 Document Reviewed: 09/14/2017 Elsevier Patient Education  [place].

## 2019-04-05 ENCOUNTER — Other Ambulatory Visit: Payer: Self-pay

## 2019-04-05 ENCOUNTER — Ambulatory Visit
Admission: RE | Admit: 2019-04-05 | Discharge: 2019-04-05 | Disposition: A | Discharge status: Home / Self Care | Payer: Medicaid Other | Source: Ambulatory Visit | Attending: Radiation Oncology | Admitting: Radiation Oncology

## 2019-04-05 DIAGNOSIS — Z510 Encounter for antineoplastic radiation therapy: Secondary | ICD-10-CM | POA: Diagnosis not present

## 2019-04-06 ENCOUNTER — Ambulatory Visit
Admission: RE | Admit: 2019-04-06 | Discharge: 2019-04-06 | Disposition: A | Discharge status: Home / Self Care | Payer: Medicaid Other | Source: Ambulatory Visit | Attending: Radiation Oncology | Admitting: Radiation Oncology

## 2019-04-06 ENCOUNTER — Other Ambulatory Visit: Payer: Self-pay | Admitting: Hematology and Oncology

## 2019-04-06 ENCOUNTER — Other Ambulatory Visit: Payer: Self-pay

## 2019-04-06 DIAGNOSIS — Z510 Encounter for antineoplastic radiation therapy: Secondary | ICD-10-CM | POA: Diagnosis not present

## 2019-04-07 ENCOUNTER — Encounter: Payer: Self-pay | Admitting: Oncology

## 2019-04-07 ENCOUNTER — Other Ambulatory Visit: Payer: Self-pay | Admitting: Hematology and Oncology

## 2019-04-07 ENCOUNTER — Other Ambulatory Visit: Payer: Self-pay

## 2019-04-07 ENCOUNTER — Encounter: Payer: Self-pay | Admitting: General Practice

## 2019-04-07 ENCOUNTER — Inpatient Hospital Stay: Payer: Medicaid Other

## 2019-04-07 ENCOUNTER — Ambulatory Visit
Admission: RE | Admit: 2019-04-07 | Discharge: 2019-04-07 | Disposition: A | Discharge status: Home / Self Care | Payer: Medicaid Other | Source: Ambulatory Visit | Attending: Radiation Oncology | Admitting: Radiation Oncology

## 2019-04-07 VITALS — BP 136/78 | HR 99 | Temp 98.7°F | Resp 18

## 2019-04-07 DIAGNOSIS — Z510 Encounter for antineoplastic radiation therapy: Secondary | ICD-10-CM | POA: Diagnosis not present

## 2019-04-07 DIAGNOSIS — C539 Malignant neoplasm of cervix uteri, unspecified: Secondary | ICD-10-CM

## 2019-04-07 DIAGNOSIS — Z5111 Encounter for antineoplastic chemotherapy: Secondary | ICD-10-CM | POA: Diagnosis not present

## 2019-04-07 MED ORDER — SODIUM CHLORIDE 0.9 % IV SOLN
30.0000 mg/m2 | Freq: Once | INTRAVENOUS | Status: AC
  Administered 2019-04-07: 60 mg via INTRAVENOUS
  Filled 2019-04-07: qty 60

## 2019-04-07 MED ORDER — DEXAMETHASONE SODIUM PHOSPHATE 10 MG/ML IJ SOLN
10.0000 mg | Freq: Once | INTRAMUSCULAR | Status: AC
  Administered 2019-04-07: 11:00:00 10 mg via INTRAVENOUS

## 2019-04-07 MED ORDER — SODIUM CHLORIDE 0.9% FLUSH
10.0000 mL | INTRAVENOUS | Status: DC | PRN
  Administered 2019-04-07: 15:00:00 10 mL
  Filled 2019-04-07: qty 10

## 2019-04-07 MED ORDER — SODIUM CHLORIDE 0.9 % IV SOLN
Freq: Once | INTRAVENOUS | Status: AC
  Filled 2019-04-07: qty 250

## 2019-04-07 MED ORDER — HEPARIN SOD (PORK) LOCK FLUSH 100 UNIT/ML IV SOLN
500.0000 [IU] | Freq: Once | INTRAVENOUS | Status: AC | PRN
  Administered 2019-04-07: 500 [IU]
  Filled 2019-04-07: qty 5

## 2019-04-07 MED ORDER — PALONOSETRON HCL INJECTION 0.25 MG/5ML
INTRAVENOUS | Status: AC
  Filled 2019-04-07: qty 5

## 2019-04-07 MED ORDER — DEXAMETHASONE SODIUM PHOSPHATE 10 MG/ML IJ SOLN
INTRAMUSCULAR | Status: AC
  Filled 2019-04-07: qty 1

## 2019-04-07 MED ORDER — PALONOSETRON HCL INJECTION 0.25 MG/5ML
0.2500 mg | Freq: Once | INTRAVENOUS | Status: AC
  Administered 2019-04-07: 0.25 mg via INTRAVENOUS

## 2019-04-07 MED ORDER — SODIUM CHLORIDE 0.9 % IV SOLN
150.0000 mg | Freq: Once | INTRAVENOUS | Status: AC
  Administered 2019-04-07: 11:00:00 150 mg via INTRAVENOUS
  Filled 2019-04-07: qty 5

## 2019-04-07 MED ORDER — POTASSIUM CHLORIDE 2 MEQ/ML IV SOLN
Freq: Once | INTRAVENOUS | Status: AC
  Filled 2019-04-07: qty 10

## 2019-04-07 NOTE — Patient Instructions (Signed)
[facility] Discharge Instructions for Patients Receiving Chemotherapy  Today you received the following chemotherapy agents Cisplatin  To help prevent nausea and vomiting after your treatment, we encourage you to take your nausea medication as prescribed.   If you develop nausea and vomiting that is not controlled by your nausea medication, call the clinic.   BELOW ARE SYMPTOMS THAT SHOULD BE REPORTED IMMEDIATELY:  *FEVER GREATER THAN 100.5 F  *CHILLS WITH OR WITHOUT FEVER  NAUSEA AND VOMITING THAT IS NOT CONTROLLED WITH YOUR NAUSEA MEDICATION  *UNUSUAL SHORTNESS OF BREATH  *UNUSUAL BRUISING OR BLEEDING  TENDERNESS IN MOUTH AND THROAT WITH OR WITHOUT PRESENCE OF ULCERS  *URINARY PROBLEMS  *BOWEL PROBLEMS  UNUSUAL RASH Items with * indicate a potential emergency and should be followed up as soon as possible.  Feel free to call the clinic should you have any questions or concerns. The clinic phone number is [phone].  Please show the [facility] at check-in to the Emergency Department and triage nurse.  Cisplatin injection What is this medicine? CISPLATIN (SIS pla tin) is a chemotherapy drug. It targets fast dividing cells, like cancer cells, and causes these cells to die. This medicine is used to treat many types of cancer like bladder, ovarian, and testicular cancers. This medicine may be used for other purposes; ask your health care provider or pharmacist if you have questions. COMMON BRAND NAME(S): Platinol, Platinol -AQ What should I tell my health care provider before I take this medicine? They need to know if you have any of these conditions:  eye disease, vision problems  hearing problems  kidney disease  low blood counts, like white cells, platelets, or red blood cells  tingling of the fingers or toes, or other nerve disorder  an unusual or allergic reaction to cisplatin, carboplatin, oxaliplatin, other medicines, foods, dyes, or  preservatives  pregnant or trying to get pregnant  breast-feeding How should I use this medicine? This drug is given as an infusion into a vein. It is administered in a hospital or clinic by a specially trained health care professional. Talk to your pediatrician regarding the use of this medicine in children. Special care may be needed. Overdosage: If you think you have taken too much of this medicine contact a poison control center or emergency room at once. NOTE: This medicine is only for you. Do not share this medicine with others. What if I miss a dose? It is important not to miss a dose. Call your doctor or health care professional if you are unable to keep an appointment. What may interact with this medicine? This medicine may interact with the following medications:  foscarnet  certain antibiotics like amikacin, gentamicin, neomycin, polymyxin B, streptomycin, tobramycin, vancomycin This list may not describe all possible interactions. Give your health care provider a list of all the medicines, herbs, non-prescription drugs, or dietary supplements you use. Also tell them if you smoke, drink alcohol, or use illegal drugs. Some items may interact with your medicine. What should I watch for while using this medicine? Your condition will be monitored carefully while you are receiving this medicine. You will need important blood work done while you are taking this medicine. This drug may make you feel generally unwell. This is not uncommon, as chemotherapy can affect healthy cells as well as cancer cells. Report any side effects. Continue your course of treatment even though you feel ill unless your doctor tells you to stop. This medicine may increase your  risk of getting an infection. Call your healthcare professional for advice if you get a fever, chills, or sore throat, or other symptoms of a cold or flu. Do not treat yourself. Try to avoid being around people who are sick. Avoid taking  medicines that contain aspirin, acetaminophen, ibuprofen, naproxen, or ketoprofen unless instructed by your healthcare professional. These medicines may hide a fever. This medicine may increase your risk to bruise or bleed. Call your doctor or health care professional if you notice any unusual bleeding. Be careful brushing and flossing your teeth or using a toothpick because you may get an infection or bleed more easily. If you have any dental work done, tell your dentist you are receiving this medicine. Do not become pregnant while taking this medicine or for 14 months after stopping it. Women should inform their healthcare professional if they wish to become pregnant or think they might be pregnant. Men should not father a child while taking this medicine and for 11 months after stopping it. There is potential for serious side effects to an unborn child. Talk to your healthcare professional for more information. Do not breast-feed an infant while taking this medicine. This medicine has caused ovarian failure in some women. This medicine may make it more difficult to get pregnant. Talk to your healthcare professional if you are concerned about your fertility. This medicine has caused decreased sperm counts in some men. This may make it more difficult to father a child. Talk to your healthcare professional if you are concerned about your fertility. Drink fluids as directed while you are taking this medicine. This will help protect your kidneys. Call your doctor or health care professional if you get diarrhea. Do not treat yourself. What side effects may I notice from receiving this medicine? Side effects that you should report to your doctor or health care professional as soon as possible:  allergic reactions like skin rash, itching or hives, swelling of the face, lips, or tongue  blurred vision  changes in vision  decreased hearing or ringing of the ears  nausea, vomiting  pain, redness, or  irritation at site where injected  pain, tingling, numbness in the hands or feet  signs and symptoms of bleeding such as bloody or black, tarry stools; red or dark brown urine; spitting up blood or brown material that looks like coffee grounds; red spots on the skin; unusual bruising or bleeding from the eyes, gums, or nose  signs and symptoms of infection like fever; chills; cough; sore throat; pain or trouble passing urine  signs and symptoms of kidney injury like trouble passing urine or change in the amount of urine  signs and symptoms of low red blood cells or anemia such as unusually weak or tired; feeling faint or lightheaded; falls; breathing problems Side effects that usually do not require medical attention (report to your doctor or health care professional if they continue or are bothersome):  loss of appetite  mouth sores  muscle cramps This list may not describe all possible side effects. Call your doctor for medical advice about side effects. You may report side effects to FDA at 1-800-FDA-1088. Where should I keep my medicine? This drug is given in a hospital or clinic and will not be stored at home. NOTE: This sheet is a summary. It may not cover all possible information. If you have questions about this medicine, talk to your doctor, pharmacist, or health care provider.  2020 Elsevier/Gold Standard (2018-02-11 15:59:17)  Coronavirus (COVID-19) Are  you at risk?  Are you at risk for the Coronavirus (COVID-19)?  To be considered HIGH RISK for Coronavirus (COVID-19), you have to meet the following criteria:  . Traveled to [place], [place], [place], [place] or [place]; or in the [place] to [place], [place], [place], or [place]; and have fever, cough, and shortness of breath within the last 2 weeks of travel OR . Been in close contact with a person diagnosed with COVID-19 within the last 2 weeks and have fever, cough, and shortness of breath . IF YOU DO NOT  MEET THESE CRITERIA, YOU ARE CONSIDERED LOW RISK FOR COVID-19.  What to do if you are HIGH RISK for COVID-19?  Marland Kitchen If you are having a medical emergency, call 911. . Seek medical care right away. Before you go to a doctor's office, urgent care or emergency department, call ahead and tell them about your recent travel, contact with someone diagnosed with COVID-19, and your symptoms. You should receive instructions from your physician's office regarding next steps of care.  . When you arrive at healthcare provider, tell the healthcare staff immediately you have returned from visiting [place], [place], [place], [place] or [place]; or traveled in the [place] to [place], [place], [place], or [place]; in the last two weeks or you have been in close contact with a person diagnosed with COVID-19 in the last 2 weeks.   . Tell the health care staff about your symptoms: fever, cough and shortness of breath. . After you have been seen by a medical provider, you will be either: o Tested for (COVID-19) and discharged home on quarantine except to seek medical care if symptoms worsen, and asked to  - Stay home and avoid contact with others until you get your results (4-5 days)  - Avoid travel on public transportation if possible (such as bus, train, or airplane) or o Sent to the Emergency Department by EMS for evaluation, COVID-19 testing, and possible admission depending on your condition and test results.  What to do if you are LOW RISK for COVID-19?  Reduce your risk of any infection by using the same precautions used for avoiding the common cold or flu:  Marland Kitchen Wash your hands often with soap and warm water for at least 20 seconds.  If soap and water are not readily available, use an alcohol-based hand sanitizer with at least 60% alcohol.  . If coughing or sneezing, cover your mouth and nose by coughing or sneezing into the elbow areas of your shirt or coat, into a tissue or into your sleeve (not your  hands). . Avoid shaking hands with others and consider head nods or verbal greetings only. . Avoid touching your eyes, nose, or mouth with unwashed hands.  . Avoid close contact with people who are sick. . Avoid places or events with large numbers of people in one location, like concerts or sporting events. . Carefully consider travel plans you have or are making. . If you are planning any travel outside or inside the [place], visit the CDC's Travelers' Health webpage for the latest health notices. . If you have some symptoms but not all symptoms, continue to monitor at home and seek medical attention if your symptoms worsen. . If you are having a medical emergency, call 911.   [place] / e-Visit: [email or web address]         [facility]: [facility]:  [facility]: [phone]

## 2019-04-07 NOTE — Progress Notes (Signed)
Met with Tamara Osborne in the infusion room.  She does not have any questions or needs at this time.

## 2019-04-07 NOTE — Progress Notes (Signed)
[facility] Spiritual Care Note  Met with [name] in infusion to introduce Spiritual Care, counseling, and [place] programming as tools to help with anxiety, coping, making meaning, and building community. Tamara Osborne has lived in this area for only two years, and of course the last year has been very isolating due to [place]. One of her strengths is being able to recognize that what escalates or deescalates her anxiety; she anticipates that having a more regular schedule with fewer tasks or new rounds of information each day will help her settle into a more comfortable, sustainable routine. Tamara Osborne also looks forward to trying out some support programming to build social connections and something meaningful to look forward to. In order to streamline the number of calls she receives, I will plan to follow up at her next treatment. She also has my card and knows to call anytime.   [place], [place], [facility] Pager [phone] Voicemail [phone]

## 2019-04-10 ENCOUNTER — Other Ambulatory Visit (HOSPITAL_COMMUNITY): Payer: Self-pay | Admitting: Radiation Oncology

## 2019-04-10 ENCOUNTER — Ambulatory Visit
Admission: RE | Admit: 2019-04-10 | Discharge: 2019-04-10 | Disposition: A | Discharge status: Home / Self Care | Payer: Medicaid Other | Source: Ambulatory Visit | Attending: Radiation Oncology | Admitting: Radiation Oncology

## 2019-04-10 ENCOUNTER — Other Ambulatory Visit: Payer: Self-pay

## 2019-04-10 ENCOUNTER — Inpatient Hospital Stay: Payer: Medicaid Other

## 2019-04-10 ENCOUNTER — Telehealth: Payer: Self-pay | Admitting: *Deleted

## 2019-04-10 ENCOUNTER — Encounter: Payer: Self-pay | Admitting: Family Medicine

## 2019-04-10 ENCOUNTER — Other Ambulatory Visit: Payer: Self-pay | Admitting: Oncology

## 2019-04-10 ENCOUNTER — Other Ambulatory Visit: Payer: Self-pay | Admitting: Radiation Oncology

## 2019-04-10 DIAGNOSIS — C539 Malignant neoplasm of cervix uteri, unspecified: Secondary | ICD-10-CM

## 2019-04-10 DIAGNOSIS — Z510 Encounter for antineoplastic radiation therapy: Secondary | ICD-10-CM | POA: Diagnosis not present

## 2019-04-10 DIAGNOSIS — Z5111 Encounter for antineoplastic chemotherapy: Secondary | ICD-10-CM | POA: Diagnosis not present

## 2019-04-10 LAB — CBC WITH DIFFERENTIAL/PLATELET
Abs Immature Granulocytes: 0.13 10*3/uL — ABNORMAL HIGH (ref 0.00–0.07)
Basophils Absolute: 0 10*3/uL (ref 0.0–0.1)
Basophils Relative: 0 %
Eosinophils Absolute: 0.3 10*3/uL (ref 0.0–0.5)
Eosinophils Relative: 3 %
HCT: 27 % — ABNORMAL LOW (ref 36.0–46.0)
Hemoglobin: 8.5 g/dL — ABNORMAL LOW (ref 12.0–15.0)
Immature Granulocytes: 1 %
Lymphocytes Relative: 3 %
Lymphs Abs: 0.3 10*3/uL — ABNORMAL LOW (ref 0.7–4.0)
MCH: 25.4 pg — ABNORMAL LOW (ref 26.0–34.0)
MCHC: 31.5 g/dL (ref 30.0–36.0)
MCV: 80.6 fL (ref 80.0–100.0)
Monocytes Absolute: 0.6 10*3/uL (ref 0.1–1.0)
Monocytes Relative: 5 %
Neutro Abs: 9.8 10*3/uL — ABNORMAL HIGH (ref 1.7–7.7)
Neutrophils Relative %: 88 %
Platelets: 377 10*3/uL (ref 150–400)
RBC: 3.35 MIL/uL — ABNORMAL LOW (ref 3.87–5.11)
RDW: 17.8 % — ABNORMAL HIGH (ref 11.5–15.5)
WBC: 11.1 10*3/uL — ABNORMAL HIGH (ref 4.0–10.5)
nRBC: 0.2 % (ref 0.0–0.2)

## 2019-04-10 LAB — COMPREHENSIVE METABOLIC PANEL
ALT: 28 U/L (ref 0–44)
AST: 10 U/L — ABNORMAL LOW (ref 15–41)
Albumin: 2.6 g/dL — ABNORMAL LOW (ref 3.5–5.0)
Alkaline Phosphatase: 64 U/L (ref 38–126)
Anion gap: 9 (ref 5–15)
BUN: 25 mg/dL — ABNORMAL HIGH (ref 6–20)
CO2: 23 mmol/L (ref 22–32)
Calcium: 8.1 mg/dL — ABNORMAL LOW (ref 8.9–10.3)
Chloride: 107 mmol/L (ref 98–111)
Creatinine, Ser: 1.2 mg/dL — ABNORMAL HIGH (ref 0.44–1.00)
GFR calc Af Amer: >60 mL/min (ref >60)
GFR calc non Af Amer: 56 mL/min — ABNORMAL LOW (ref >60)
Glucose, Bld: 87 mg/dL (ref 70–99)
Potassium: 3.7 mmol/L (ref 3.5–5.1)
Sodium: 139 mmol/L (ref 135–145)
Total Bilirubin: 0.3 mg/dL (ref 0.3–1.2)
Total Protein: 6.4 g/dL — ABNORMAL LOW (ref 6.5–8.1)

## 2019-04-10 LAB — SAMPLE TO BLOOD BANK

## 2019-04-10 LAB — MAGNESIUM: Magnesium: 1.8 mg/dL (ref 1.7–2.4)

## 2019-04-10 MED ORDER — SODIUM CHLORIDE 0.9% FLUSH
10.0000 mL | Freq: Once | INTRAVENOUS | Status: AC
  Administered 2019-04-10: 11:00:00 10 mL
  Filled 2019-04-10: qty 10

## 2019-04-10 MED ORDER — HEPARIN SOD (PORK) LOCK FLUSH 100 UNIT/ML IV SOLN
500.0000 [IU] | Freq: Once | INTRAVENOUS | Status: AC
  Administered 2019-04-10: 500 [IU]
  Filled 2019-04-10: qty 5

## 2019-04-10 NOTE — Progress Notes (Signed)
Gynecologic Oncology Multi-Disciplinary Disposition Conference Note  Date of the Conference: 04/10/2019  Patient Name: Tamara Osborne  Referring Provider: Dr. Sabra Heck Primary GYN Oncologist: Dr. Berline Lopes  Stage/Disposition:  Stage IIB invasive squamous cell carcinoma of the cervix. Disposition is for primary radiation with weekly cisplatin.   This Multidisciplinary conference took place involving physicians from [facility], [place], Radiation Oncology, Pathology, Radiology along with the Gynecologic Oncology Nurse Practitioner and RN.  Comprehensive assessment of the patient's malignancy, staging, need for surgery, chemotherapy, radiation therapy, and need for further testing were reviewed. Supportive measures, both inpatient and following discharge were also discussed. The recommended plan of care is documented. Greater than 35 minutes were spent correlating and coordinating this patient's care.

## 2019-04-10 NOTE — Telephone Encounter (Signed)
-----   Message from Rolene Course, RN sent at 04/07/2019 11:25 AM EST ----- Regarding: Tamara Osborne 1st Tx F/U call - Cisplatin [place] 1st Tx F/U call - cisplatin

## 2019-04-10 NOTE — Telephone Encounter (Signed)
Called pt to see how she did with her treatment last week.  She reports still having some diarrhea daily but not any different.  She reports not more that 3-4x/day.  She felt weak & slept extra over weekend.  She is taking imodium. She had a sore throat Sat but reports that she has sleep apnea & relates this symptom to that & it was better by lunch on sat. She thinks she may be having her period now.  She will see Dr Alvy Bimler tomorrow to address these issues. Informed to call if anything changes or she needs anything.

## 2019-04-11 ENCOUNTER — Encounter: Payer: Self-pay | Admitting: Hematology and Oncology

## 2019-04-11 ENCOUNTER — Encounter: Payer: Self-pay | Admitting: Oncology

## 2019-04-11 ENCOUNTER — Inpatient Hospital Stay (HOSPITAL_BASED_OUTPATIENT_CLINIC_OR_DEPARTMENT_OTHER): Payer: Medicaid Other | Admitting: Hematology and Oncology

## 2019-04-11 ENCOUNTER — Other Ambulatory Visit: Payer: Self-pay

## 2019-04-11 ENCOUNTER — Ambulatory Visit
Admission: RE | Admit: 2019-04-11 | Discharge: 2019-04-11 | Disposition: A | Discharge status: Home / Self Care | Payer: Medicaid Other | Source: Ambulatory Visit | Attending: Radiation Oncology | Admitting: Radiation Oncology

## 2019-04-11 DIAGNOSIS — Z510 Encounter for antineoplastic radiation therapy: Secondary | ICD-10-CM | POA: Diagnosis not present

## 2019-04-11 DIAGNOSIS — C539 Malignant neoplasm of cervix uteri, unspecified: Secondary | ICD-10-CM | POA: Diagnosis not present

## 2019-04-11 DIAGNOSIS — Z5111 Encounter for antineoplastic chemotherapy: Secondary | ICD-10-CM | POA: Diagnosis not present

## 2019-04-11 DIAGNOSIS — N183 Chronic kidney disease, stage 3 unspecified: Secondary | ICD-10-CM | POA: Diagnosis not present

## 2019-04-11 DIAGNOSIS — N939 Abnormal uterine and vaginal bleeding, unspecified: Secondary | ICD-10-CM

## 2019-04-11 DIAGNOSIS — R197 Diarrhea, unspecified: Secondary | ICD-10-CM

## 2019-04-11 NOTE — Progress Notes (Signed)
Assisted Waldron with completing her Medicaid forms.  Faxed completed forms per Pearly's request to her case worker, Wynell Balloon - [place], [place] at [phone].

## 2019-04-11 NOTE — Progress Notes (Signed)
[facility] OFFICE PROGRESS NOTE  Patient Care Team: [place], [place] as PCP - General (Family Medicine)  ASSESSMENT & PLAN:  Squamous cell carcinoma of cervix ([place]) So far, she tolerated treatment well except for some mild fatigue and persistent diarrhea Her blood counts are stable We will proceed with chemotherapy as scheduled  CKD (chronic kidney disease), symptom management only, stage 3 (moderate) Renal function is improved She will continue hydration as tolerated  Abnormal vaginal bleeding Her hemoglobin is stable She does not need transfusion support this week  Diarrhea I recommend hydration as tolerated and Imodium as needed   No orders of the defined types were placed in this encounter.   All questions were answered. The patient knows to call the clinic with any problems, questions or concerns. The total time spent in the appointment was 20 minutes encounter with patients including review of chart and various tests results, discussions about plan of care and coordination of care plan   [name], MD 04/11/2019 9:50 AM  INTERVAL HISTORY: Please see below for problem oriented charting. She returns for further follow-up She tolerated first cycle of treatment well She continues to have loose bowel movement 3-4 times a day but she is able to hydrate herself adequately She has some fatigue Denies recent nausea No peripheral neuropathy Her vaginal bleeding is less  SUMMARY OF ONCOLOGIC HISTORY: Oncology History  Squamous cell carcinoma of cervix ([place])  03/21/2019 Pathology Results   The biopsy is superficial and therefore depth of invasion cannot be  determined.  There is at least carcinoma in-situ.    03/23/2019 Initial Diagnosis   Squamous cell carcinoma of cervix ([place])   03/23/2019 Pathology Results   CERVIX, 11 O CLOCK, BIOPSY:  -  Squamous cell carcinoma, invasive  -  See comment   03/28/2019 PET scan   1. Hypermetabolic  cervical mass measures roughly 7.6 by 6.9 by 4.9 cm, compatible with malignancy. No adenopathy or distant metastatic spread identified. 2. Extending cephalad and anterior to the uterine fundus, there is a 450 cubic cm simple fluid density structure without hypermetabolic activity. This may represent a right adnexal cyst (favored), peritoneal inclusion cyst, or (if the patient has a remote history of pancreatitis) a remote pseudocyst. 3. Faint ground density nodularity in both lower lobes which could be from atypical infection or extrinsic allergic alveolitis. Given nodular appearance of some of this ground-glass density, I would recommend follow up chest CT in 3 months time in order to ensure clearance. 4. Moderate cardiomegaly, cause uncertain. The patient also has advanced for age/gender atherosclerosis. Aortic Atherosclerosis (ICD10-I70.0).     04/04/2019 Procedure   Successful placement of a right internal jugular approach power injectable Port-A-Cath. The catheter is ready for immediate use   04/07/2019 -  Chemotherapy   The patient had weekly cisplatin for chemotherapy treatment.       REVIEW OF SYSTEMS:   Constitutional: Denies fevers, chills or abnormal weight loss Eyes: Denies blurriness of vision Ears, nose, mouth, throat, and face: Denies mucositis or sore throat Respiratory: Denies cough, dyspnea or wheezes Cardiovascular: Denies palpitation, chest discomfort or lower extremity swelling Skin: Denies abnormal skin rashes Lymphatics: Denies new lymphadenopathy or easy bruising Neurological:Denies numbness, tingling or new weaknesses Behavioral/Psych: Mood is stable, no new changes  All other systems were reviewed with the patient and are negative.  I have reviewed the past medical history, past surgical history, social history and family history with the patient and they are unchanged from  previous note.  ALLERGIES:  is allergic to penicillins; shellfish allergy; and sulfa  antibiotics.  MEDICATIONS:  Current Outpatient Medications  Medication Sig Dispense Refill  . amLODipine (NORVASC) 10 MG tablet Take 1 tablet (10 mg total) by mouth daily. 90 tablet 0  . cholecalciferol (VITAMIN D3) 25 MCG (1000 UNIT) tablet Take 1,000 Units by mouth daily.    . cloNIDine (CATAPRES) 0.1 MG tablet Take 1 tablet (0.1 mg total) by mouth 2 (two) times daily as needed (blood pressure >180/110). 50 tablet 0  . diphenhydramine-acetaminophen (TYLENOL PM) 25-500 MG TABS tablet Take 1 tablet by mouth at bedtime as needed.    . fluticasone (FLONASE) 50 MCG/ACT nasal spray Place 1 spray into both nostrils daily as needed for allergies or rhinitis.    . hydrOXYzine (ATARAX/VISTARIL) 25 MG tablet Take 1 tablet (25 mg total) by mouth every 6 (six) hours as needed for anxiety. 20 tablet 0  . lidocaine-prilocaine (EMLA) cream Apply to affected area once 30 g 3  . loperamide (IMODIUM) 2 MG capsule Take by mouth as needed for diarrhea or loose stools.    Marland Kitchen LORazepam (ATIVAN) 0.5 MG tablet Take 1 tablet (0.5 mg total) by mouth every 8 (eight) hours as needed for anxiety. 30 tablet 0  . ondansetron (ZOFRAN) 8 MG tablet Take 1 tablet (8 mg total) by mouth every 8 (eight) hours as needed. 30 tablet 1  . prochlorperazine (COMPAZINE) 10 MG tablet Take 1 tablet (10 mg total) by mouth every 6 (six) hours as needed (Nausea or vomiting). 30 tablet 1  . senna-docusate (SENOKOT-S) 8.6-50 MG tablet Take 2 tablets by mouth at bedtime. To prevent constipation, do not take if having loose stools 60 tablet 1   No current facility-administered medications for this visit.    PHYSICAL EXAMINATION: ECOG PERFORMANCE STATUS: 1 - Symptomatic but completely ambulatory  Vitals:   04/11/19 0935  BP: 138/77  Pulse: 94  Resp: 18  Temp: 97.8 F (36.6 C)  SpO2: 100%   Filed Weights   04/11/19 0935  Weight: 197 lb 6.4 oz (89.5 kg)    GENERAL:alert, no distress and comfortable SKIN: skin color, texture, turgor  are normal, no rashes or significant lesions EYES: normal, Conjunctiva are pink and non-injected, sclera clear OROPHARYNX:no exudate, no erythema and lips, buccal mucosa, and tongue normal  NECK: supple, thyroid normal size, non-tender, without nodularity LYMPH:  no palpable lymphadenopathy in the cervical, axillary or inguinal LUNGS: clear to auscultation and percussion with normal breathing effort HEART: regular rate & rhythm and no murmurs and no lower extremity edema ABDOMEN:abdomen soft, non-tender and normal bowel sounds Musculoskeletal:no cyanosis of digits and no clubbing  NEURO: alert & oriented x 3 with fluent speech, no focal motor/sensory deficits  LABORATORY DATA:  I have reviewed the data as listed    Component Value Date/Time   NA 139 04/10/2019 1115   K 3.7 04/10/2019 1115   CL 107 04/10/2019 1115   CO2 23 04/10/2019 1115   GLUCOSE 87 04/10/2019 1115   BUN 25 (H) 04/10/2019 1115   CREATININE 1.20 (H) 04/10/2019 1115   CALCIUM 8.1 (L) 04/10/2019 1115   PROT 6.4 (L) 04/10/2019 1115   ALBUMIN 2.6 (L) 04/10/2019 1115   AST 10 (L) 04/10/2019 1115   ALT 28 04/10/2019 1115   ALKPHOS 64 04/10/2019 1115   BILITOT 0.3 04/10/2019 1115   GFRNONAA 56 (L) 04/10/2019 1115   GFRAA >60 04/10/2019 1115    No results found for: SPEP, UPEP  Lab Results  Component Value Date   WBC 11.1 (H) 04/10/2019   NEUTROABS 9.8 (H) 04/10/2019   HGB 8.5 (L) 04/10/2019   HCT 27.0 (L) 04/10/2019   MCV 80.6 04/10/2019   PLT 377 04/10/2019      Chemistry      Component Value Date/Time   NA 139 04/10/2019 1115   K 3.7 04/10/2019 1115   CL 107 04/10/2019 1115   CO2 23 04/10/2019 1115   BUN 25 (H) 04/10/2019 1115   CREATININE 1.20 (H) 04/10/2019 1115      Component Value Date/Time   CALCIUM 8.1 (L) 04/10/2019 1115   ALKPHOS 64 04/10/2019 1115   AST 10 (L) 04/10/2019 1115   ALT 28 04/10/2019 1115   BILITOT 0.3 04/10/2019 1115       RADIOGRAPHIC STUDIES: I have personally  reviewed the radiological images as listed and agreed with the findings in the report. US Transvaginal Non-OB  Result Date: 03/16/2019 SEE PROGRESS NOTE  NM PET Image Initial (PI) Skull Base To Thigh  Result Date: 03/28/2019 CLINICAL DATA:  Initial treatment strategy for cervical cancer. EXAM: NUCLEAR MEDICINE PET SKULL BASE TO THIGH TECHNIQUE: 11.1 mCi F-18 FDG was injected intravenously. Full-ring PET imaging was performed from the skull base to thigh after the radiotracer. CT data was obtained and used for attenuation correction and anatomic localization. Fasting blood glucose: 98 mg/dl COMPARISON:  Transvaginal ultrasound 03/16/2019 2 FINDINGS: Mediastinal blood pool activity: SUV max 2.4 Liver activity: SUV max NA NECK: No significant abnormal hypermetabolic activity in this region. Incidental CT findings: none CHEST: 0.7 cm ground-glass density nodule in the left lower lobe on image 116/3. Adjacent 1.2 cm ground-glass density nodule in the left lower lobe on image 112/3. Subtle ground-glass density in the superior segment right lower lobe on image 93/3, difficult to measure due to indistinct margins. No significant associated accentuated metabolic activity. Incidental CT findings: Moderate cardiomegaly. Mild descending thoracic aortic atherosclerotic calcification. ABDOMEN/PELVIS: Hypermetabolic cervical mass noted, maximum SUV 23.9. Associated abnormal metabolic activity measures approximately 7.6 by 6.9 by 4.9 cm. No pathologically enlarged or hypermetabolic adenopathy observed in the abdomen or pelvis Incidental CT findings: Extending cephalad and anterior to the uterine fundus, and 11.3 by 8.3 by 9.2 cm (volume = 450 cm^3) simple fluid density structure is present. This appears photopenic on the PET-CT. This may minimally abut the right adnexa but appears separate from the left ovary/adnexa. Aortoiliac atherosclerotic vascular disease. SKELETON: Symmetric low-grade activity throughout the skeleton  without any corresponding lesion on the CT data, likely from granulocyte stimulation. Incidental CT findings: none IMPRESSION: 1. Hypermetabolic cervical mass measures roughly 7.6 by 6.9 by 4.9 cm, compatible with malignancy. No adenopathy or distant metastatic spread identified. 2. Extending cephalad and anterior to the uterine fundus, there is a 450 cubic cm simple fluid density structure without hypermetabolic activity. This may represent a right adnexal cyst (favored), peritoneal inclusion cyst, or (if the patient has a remote history of pancreatitis) a remote pseudocyst. 3. Faint ground density nodularity in both lower lobes which could be from atypical infection or extrinsic allergic alveolitis. Given nodular appearance of some of this ground-glass density, I would recommend follow up chest CT in 3 months time in order to ensure clearance. 4. Moderate cardiomegaly, cause uncertain. The patient also has advanced for age/gender atherosclerosis. Aortic Atherosclerosis (ICD10-I70.0). Electronically Signed   By: Van Clines M.D.   On: 03/28/2019 15:22   IR IMAGING GUIDED PORT INSERTION  Result Date: 04/04/2019 INDICATION: History of  cervical cancer. In need of durable intravenous access for chemotherapy administration. EXAM: IMPLANTED PORT A CATH PLACEMENT WITH ULTRASOUND AND FLUOROSCOPIC GUIDANCE COMPARISON:  PET-CT-03/28/2019 MEDICATIONS: Clindamycin 900 mg IV; The antibiotic was administered within an appropriate time interval prior to skin puncture. ANESTHESIA/SEDATION: Moderate (conscious) sedation was employed during this procedure. A total of Versed 4 mg and Fentanyl 100 mcg was administered intravenously. Moderate Sedation Time: 23 minutes. The patient's level of consciousness and vital signs were monitored continuously by radiology nursing throughout the procedure under my direct supervision. CONTRAST:  None FLUOROSCOPY TIME:  36 seconds (3 mGy) COMPLICATIONS: None immediate. PROCEDURE: The  procedure, risks, benefits, and alternatives were explained to the patient. Questions regarding the procedure were encouraged and answered. The patient understands and consents to the procedure. The right neck and chest were prepped with chlorhexidine in a sterile fashion, and a sterile drape was applied covering the operative field. Maximum barrier sterile technique with sterile gowns and gloves were used for the procedure. A timeout was performed prior to the initiation of the procedure. Local anesthesia was provided with 1% lidocaine with epinephrine. After creating a small venotomy incision, a micropuncture kit was utilized to access the internal jugular vein. Real-time ultrasound guidance was utilized for vascular access including the acquisition of a permanent ultrasound image documenting patency of the accessed vessel. The microwire was utilized to measure appropriate catheter length. A subcutaneous port pocket was then created along the upper chest wall utilizing a combination of sharp and blunt dissection. The pocket was irrigated with sterile saline. A single lumen "ISP" sized power injectable port was chosen for placement. The 8 Fr catheter was tunneled from the port pocket site to the venotomy incision. The port was placed in the pocket. The external catheter was trimmed to appropriate length. At the venotomy, an 8 Fr peel-away sheath was placed over a guidewire under fluoroscopic guidance. The catheter was then placed through the sheath and the sheath was removed. Final catheter positioning was confirmed and documented with a fluoroscopic spot radiograph. The port was accessed with a Huber needle, aspirated and flushed with heparinized saline. The venotomy site was closed with an interrupted 4-0 Vicryl suture. The port pocket incision was closed with interrupted 2-0 Vicryl suture and the skin was opposed with a running subcuticular 4-0 Vicryl suture. Dermabond and Steri-strips were applied to both  incisions. Dressings were placed. The patient tolerated the procedure well without immediate post procedural complication. FINDINGS: After catheter placement, the tip lies within the superior cavoatrial junction. The catheter aspirates and flushes normally and is ready for immediate use. IMPRESSION: Successful placement of a right internal jugular approach power injectable Port-A-Cath. The catheter is ready for immediate use. Electronically Signed   By: Sandi Mariscal M.D.   On: 04/04/2019 12:09

## 2019-04-11 NOTE — Assessment & Plan Note (Signed)
Her hemoglobin is stable She does not need transfusion support this week

## 2019-04-11 NOTE — Assessment & Plan Note (Signed)
So far, she tolerated treatment well except for some mild fatigue and persistent diarrhea Her blood counts are stable We will proceed with chemotherapy as scheduled

## 2019-04-11 NOTE — Assessment & Plan Note (Signed)
Renal function is improved She will continue hydration as tolerated

## 2019-04-11 NOTE — Assessment & Plan Note (Signed)
I recommend hydration as tolerated and Imodium as needed

## 2019-04-12 ENCOUNTER — Ambulatory Visit
Admission: RE | Admit: 2019-04-12 | Discharge: 2019-04-12 | Disposition: A | Discharge status: Home / Self Care | Payer: Medicaid Other | Source: Ambulatory Visit | Attending: Radiation Oncology | Admitting: Radiation Oncology

## 2019-04-12 ENCOUNTER — Other Ambulatory Visit: Payer: Self-pay

## 2019-04-12 DIAGNOSIS — Z510 Encounter for antineoplastic radiation therapy: Secondary | ICD-10-CM | POA: Diagnosis not present

## 2019-04-13 ENCOUNTER — Other Ambulatory Visit: Payer: Self-pay

## 2019-04-13 ENCOUNTER — Ambulatory Visit
Admission: RE | Admit: 2019-04-13 | Discharge: 2019-04-13 | Disposition: A | Discharge status: Home / Self Care | Payer: Medicaid Other | Source: Ambulatory Visit | Attending: Radiation Oncology | Admitting: Radiation Oncology

## 2019-04-13 DIAGNOSIS — Z510 Encounter for antineoplastic radiation therapy: Secondary | ICD-10-CM | POA: Diagnosis not present

## 2019-04-14 ENCOUNTER — Inpatient Hospital Stay: Payer: Medicaid Other

## 2019-04-14 ENCOUNTER — Encounter: Payer: Self-pay | Admitting: General Practice

## 2019-04-14 ENCOUNTER — Ambulatory Visit
Admission: RE | Admit: 2019-04-14 | Discharge: 2019-04-14 | Disposition: A | Discharge status: Home / Self Care | Payer: Medicaid Other | Source: Ambulatory Visit | Attending: Radiation Oncology | Admitting: Radiation Oncology

## 2019-04-14 ENCOUNTER — Other Ambulatory Visit: Payer: Self-pay

## 2019-04-14 VITALS — BP 150/89 | HR 97 | Temp 98.5°F | Resp 20 | Wt 197.5 lb

## 2019-04-14 DIAGNOSIS — C539 Malignant neoplasm of cervix uteri, unspecified: Secondary | ICD-10-CM

## 2019-04-14 DIAGNOSIS — Z5111 Encounter for antineoplastic chemotherapy: Secondary | ICD-10-CM | POA: Diagnosis not present

## 2019-04-14 DIAGNOSIS — Z510 Encounter for antineoplastic radiation therapy: Secondary | ICD-10-CM | POA: Diagnosis not present

## 2019-04-14 MED ORDER — PALONOSETRON HCL INJECTION 0.25 MG/5ML
0.2500 mg | Freq: Once | INTRAVENOUS | Status: AC
  Administered 2019-04-14: 0.25 mg via INTRAVENOUS

## 2019-04-14 MED ORDER — SODIUM CHLORIDE 0.9 % IV SOLN
Freq: Once | INTRAVENOUS | Status: AC
  Filled 2019-04-14: qty 250

## 2019-04-14 MED ORDER — POTASSIUM CHLORIDE 2 MEQ/ML IV SOLN
Freq: Once | INTRAVENOUS | Status: AC
  Filled 2019-04-14: qty 10

## 2019-04-14 MED ORDER — HEPARIN SOD (PORK) LOCK FLUSH 100 UNIT/ML IV SOLN
500.0000 [IU] | Freq: Once | INTRAVENOUS | Status: AC | PRN
  Administered 2019-04-14: 16:00:00 500 [IU]
  Filled 2019-04-14: qty 5

## 2019-04-14 MED ORDER — SODIUM CHLORIDE 0.9 % IV SOLN
150.0000 mg | Freq: Once | INTRAVENOUS | Status: AC
  Administered 2019-04-14: 150 mg via INTRAVENOUS
  Filled 2019-04-14: qty 150

## 2019-04-14 MED ORDER — SODIUM CHLORIDE 0.9 % IV SOLN
30.0000 mg/m2 | Freq: Once | INTRAVENOUS | Status: AC
  Administered 2019-04-14: 12:00:00 60 mg via INTRAVENOUS
  Filled 2019-04-14: qty 60

## 2019-04-14 MED ORDER — DEXAMETHASONE SODIUM PHOSPHATE 10 MG/ML IJ SOLN
10.0000 mg | Freq: Once | INTRAMUSCULAR | Status: AC
  Administered 2019-04-14: 11:00:00 10 mg via INTRAVENOUS

## 2019-04-14 MED ORDER — SODIUM CHLORIDE 0.9% FLUSH
10.0000 mL | INTRAVENOUS | Status: DC | PRN
  Administered 2019-04-14: 10 mL
  Filled 2019-04-14: qty 10

## 2019-04-14 MED ORDER — DEXAMETHASONE SODIUM PHOSPHATE 10 MG/ML IJ SOLN
INTRAMUSCULAR | Status: AC
  Filled 2019-04-14: qty 1

## 2019-04-14 MED ORDER — PALONOSETRON HCL INJECTION 0.25 MG/5ML
INTRAVENOUS | Status: AC
  Filled 2019-04-14: qty 5

## 2019-04-14 NOTE — Progress Notes (Signed)
[facility] Spiritual Care Note  Followed up with Tamara Osborne in infusion today. She noted that she was in much better spirits now that she knows what to expect with chemo, instead of feeling so anxious about all the unknowns. Per her request, ensured that she has counseling intern Anda Kraft Sprinkle's contact info because she would like to schedule a session based on constructive past experience with grief counseling. Affirmed this strength of insight and self-care. Plan to follow up at next chemo treatment.   [place], [place], [facility] Pager [phone] Voicemail [phone]

## 2019-04-14 NOTE — Patient Instructions (Signed)
[facility] Discharge Instructions for Patients Receiving Chemotherapy  Today you received the following chemotherapy agent: Cisplatin  To help prevent nausea and vomiting after your treatment, we encourage you to take your nausea medication as directed by your MD.   If you develop nausea and vomiting that is not controlled by your nausea medication, call the clinic.   BELOW ARE SYMPTOMS THAT SHOULD BE REPORTED IMMEDIATELY:  *FEVER GREATER THAN 100.5 F  *CHILLS WITH OR WITHOUT FEVER  NAUSEA AND VOMITING THAT IS NOT CONTROLLED WITH YOUR NAUSEA MEDICATION  *UNUSUAL SHORTNESS OF BREATH  *UNUSUAL BRUISING OR BLEEDING  TENDERNESS IN MOUTH AND THROAT WITH OR WITHOUT PRESENCE OF ULCERS  *URINARY PROBLEMS  *BOWEL PROBLEMS  UNUSUAL RASH Items with * indicate a potential emergency and should be followed up as soon as possible.  Feel free to call the clinic should you have any questions or concerns. The clinic phone number is [phone].  Please show the [facility] at check-in to the Emergency Department and triage nurse.  Coronavirus (COVID-19) Are you at risk?  Are you at risk for the Coronavirus (COVID-19)?  To be considered HIGH RISK for Coronavirus (COVID-19), you have to meet the following criteria:  . Traveled to [place], [place], [place], [place] or [place]; or in the [place] to [place], [place], [place], or [place]; and have fever, cough, and shortness of breath within the last 2 weeks of travel OR . Been in close contact with a person diagnosed with COVID-19 within the last 2 weeks and have fever, cough, and shortness of breath . IF YOU DO NOT MEET THESE CRITERIA, YOU ARE CONSIDERED LOW RISK FOR COVID-19.  What to do if you are HIGH RISK for COVID-19?  Marland Kitchen If you are having a medical emergency, call 911. . Seek medical care right away. Before you go to a doctor's office, urgent care or emergency department, call ahead and tell them about  your recent travel, contact with someone diagnosed with COVID-19, and your symptoms. You should receive instructions from your physician's office regarding next steps of care.  . When you arrive at healthcare provider, tell the healthcare staff immediately you have returned from visiting [place], [place], [place], [place] or [place]; or traveled in the [place] to [place], [place], [place], or [place]; in the last two weeks or you have been in close contact with a person diagnosed with COVID-19 in the last 2 weeks.   . Tell the health care staff about your symptoms: fever, cough and shortness of breath. . After you have been seen by a medical provider, you will be either: o Tested for (COVID-19) and discharged home on quarantine except to seek medical care if symptoms worsen, and asked to  - Stay home and avoid contact with others until you get your results (4-5 days)  - Avoid travel on public transportation if possible (such as bus, train, or airplane) or o Sent to the Emergency Department by EMS for evaluation, COVID-19 testing, and possible admission depending on your condition and test results.  What to do if you are LOW RISK for COVID-19?  Reduce your risk of any infection by using the same precautions used for avoiding the common cold or flu:  Marland Kitchen Wash your hands often with soap and warm water for at least 20 seconds.  If soap and water are not readily available, use an alcohol-based hand sanitizer with at least 60% alcohol.  . If  coughing or sneezing, cover your mouth and nose by coughing or sneezing into the elbow areas of your shirt or coat, into a tissue or into your sleeve (not your hands). . Avoid shaking hands with others and consider head nods or verbal greetings only. . Avoid touching your eyes, nose, or mouth with unwashed hands.  . Avoid close contact with people who are sick. . Avoid places or events with large numbers of people in one location, like concerts or sporting  events. . Carefully consider travel plans you have or are making. . If you are planning any travel outside or inside the [place], visit the CDC's Travelers' Health webpage for the latest health notices. . If you have some symptoms but not all symptoms, continue to monitor at home and seek medical attention if your symptoms worsen. . If you are having a medical emergency, call 911.   [place] / e-Visit: [email or web address]         [facility]: [facility]: [phone]                   [facility]: [phone]

## 2019-04-17 ENCOUNTER — Other Ambulatory Visit: Payer: Self-pay

## 2019-04-17 ENCOUNTER — Ambulatory Visit
Admission: RE | Admit: 2019-04-17 | Discharge: 2019-04-17 | Disposition: A | Discharge status: Home / Self Care | Payer: Medicaid Other | Source: Ambulatory Visit | Attending: Radiation Oncology | Admitting: Radiation Oncology

## 2019-04-17 ENCOUNTER — Inpatient Hospital Stay: Payer: Medicaid Other

## 2019-04-17 VITALS — BP 153/86 | HR 96 | Temp 97.8°F | Resp 20

## 2019-04-17 DIAGNOSIS — Z510 Encounter for antineoplastic radiation therapy: Secondary | ICD-10-CM | POA: Diagnosis not present

## 2019-04-17 DIAGNOSIS — C539 Malignant neoplasm of cervix uteri, unspecified: Secondary | ICD-10-CM

## 2019-04-17 DIAGNOSIS — Z5111 Encounter for antineoplastic chemotherapy: Secondary | ICD-10-CM | POA: Diagnosis not present

## 2019-04-17 LAB — CBC WITH DIFFERENTIAL/PLATELET
Abs Immature Granulocytes: 0.09 10*3/uL — ABNORMAL HIGH (ref 0.00–0.07)
Basophils Absolute: 0 10*3/uL (ref 0.0–0.1)
Basophils Relative: 0 %
Eosinophils Absolute: 0.2 10*3/uL (ref 0.0–0.5)
Eosinophils Relative: 2 %
HCT: 26.3 % — ABNORMAL LOW (ref 36.0–46.0)
Hemoglobin: 8.6 g/dL — ABNORMAL LOW (ref 12.0–15.0)
Immature Granulocytes: 1 %
Lymphocytes Relative: 2 %
Lymphs Abs: 0.2 10*3/uL — ABNORMAL LOW (ref 0.7–4.0)
MCH: 25.9 pg — ABNORMAL LOW (ref 26.0–34.0)
MCHC: 32.7 g/dL (ref 30.0–36.0)
MCV: 79.2 fL — ABNORMAL LOW (ref 80.0–100.0)
Monocytes Absolute: 0.5 10*3/uL (ref 0.1–1.0)
Monocytes Relative: 4 %
Neutro Abs: 10.2 10*3/uL — ABNORMAL HIGH (ref 1.7–7.7)
Neutrophils Relative %: 91 %
Platelets: 269 10*3/uL (ref 150–400)
RBC: 3.32 MIL/uL — ABNORMAL LOW (ref 3.87–5.11)
RDW: 19 % — ABNORMAL HIGH (ref 11.5–15.5)
WBC: 11.3 10*3/uL — ABNORMAL HIGH (ref 4.0–10.5)
nRBC: 0 % (ref 0.0–0.2)

## 2019-04-17 LAB — COMPREHENSIVE METABOLIC PANEL
ALT: 19 U/L (ref 0–44)
AST: 8 U/L — ABNORMAL LOW (ref 15–41)
Albumin: 3.1 g/dL — ABNORMAL LOW (ref 3.5–5.0)
Alkaline Phosphatase: 78 U/L (ref 38–126)
Anion gap: 9 (ref 5–15)
BUN: 23 mg/dL — ABNORMAL HIGH (ref 6–20)
CO2: 25 mmol/L (ref 22–32)
Calcium: 8.7 mg/dL — ABNORMAL LOW (ref 8.9–10.3)
Chloride: 100 mmol/L (ref 98–111)
Creatinine, Ser: 1.16 mg/dL — ABNORMAL HIGH (ref 0.44–1.00)
GFR calc Af Amer: >60 mL/min (ref >60)
GFR calc non Af Amer: 58 mL/min — ABNORMAL LOW (ref >60)
Glucose, Bld: 100 mg/dL — ABNORMAL HIGH (ref 70–99)
Potassium: 4.3 mmol/L (ref 3.5–5.1)
Sodium: 134 mmol/L — ABNORMAL LOW (ref 135–145)
Total Bilirubin: 0.5 mg/dL (ref 0.3–1.2)
Total Protein: 6.6 g/dL (ref 6.5–8.1)

## 2019-04-17 LAB — SAMPLE TO BLOOD BANK

## 2019-04-17 LAB — MAGNESIUM: Magnesium: 1.6 mg/dL — ABNORMAL LOW (ref 1.7–2.4)

## 2019-04-17 MED ORDER — SODIUM CHLORIDE 0.9% FLUSH
10.0000 mL | Freq: Once | INTRAVENOUS | Status: AC
  Administered 2019-04-17: 10 mL
  Filled 2019-04-17: qty 10

## 2019-04-17 MED ORDER — HEPARIN SOD (PORK) LOCK FLUSH 100 UNIT/ML IV SOLN
500.0000 [IU] | Freq: Once | INTRAVENOUS | Status: AC
  Administered 2019-04-17: 11:00:00 500 [IU]
  Filled 2019-04-17: qty 5

## 2019-04-17 NOTE — Patient Instructions (Addendum)
**  Pick up Colace (stool softner) and/or Miralax (powder to mix in beverage) to help with constipation if coffee/milk products don't help**  Constipation, Adult Constipation is when a person:  Poops (has a bowel movement) fewer times in a week than normal.  Has a hard time pooping.  Has poop that is dry, hard, or bigger than normal. Follow these instructions at home: Eating and drinking   Eat foods that have a lot of fiber, such as: ? Fresh fruits and vegetables. ? Whole grains. ? Beans.  Eat less of foods that are high in fat, low in fiber, or overly processed, such as: ? [place] fries. ? Hamburgers. ? Cookies. ? Candy. ? Soda.  Drink enough fluid to keep your pee (urine) clear or pale yellow. General instructions  Exercise regularly or as told by your doctor.  Go to the restroom when you feel like you need to poop. Do not hold it in.  Take over-the-counter and prescription medicines only as told by your doctor. These include any fiber supplements.  Do pelvic floor retraining exercises, such as: ? Doing deep breathing while relaxing your lower belly (abdomen). ? Relaxing your pelvic floor while pooping.  Watch your condition for any changes.  Keep all follow-up visits as told by your doctor. This is important. Contact a doctor if:  You have pain that gets worse.  You have a fever.  You have not pooped for 4 days.  You throw up (vomit).  You are not hungry.  You lose weight.  You are bleeding from the anus.  You have thin, pencil-like poop (stool). Get help right away if:  You have a fever, and your symptoms suddenly get worse.  You leak poop or have blood in your poop.  Your belly feels hard or bigger than normal (is bloated).  You have very bad belly pain.  You feel dizzy or you faint. This information is not intended to replace advice given to you by your health care provider. Make sure you discuss any questions you have with your health care  provider. Document Revised: 01/29/2017 Document Reviewed: 08/07/2015 Elsevier Patient Education  2020 [employer].

## 2019-04-18 ENCOUNTER — Inpatient Hospital Stay (HOSPITAL_BASED_OUTPATIENT_CLINIC_OR_DEPARTMENT_OTHER): Payer: Medicaid Other | Admitting: Hematology and Oncology

## 2019-04-18 ENCOUNTER — Encounter: Payer: Self-pay | Admitting: Hematology and Oncology

## 2019-04-18 ENCOUNTER — Other Ambulatory Visit: Payer: Self-pay

## 2019-04-18 ENCOUNTER — Ambulatory Visit
Admission: RE | Admit: 2019-04-18 | Discharge: 2019-04-18 | Disposition: A | Discharge status: Home / Self Care | Payer: Medicaid Other | Source: Ambulatory Visit | Attending: Radiation Oncology | Admitting: Radiation Oncology

## 2019-04-18 DIAGNOSIS — Z5111 Encounter for antineoplastic chemotherapy: Secondary | ICD-10-CM | POA: Diagnosis not present

## 2019-04-18 DIAGNOSIS — N939 Abnormal uterine and vaginal bleeding, unspecified: Secondary | ICD-10-CM | POA: Diagnosis not present

## 2019-04-18 DIAGNOSIS — N183 Chronic kidney disease, stage 3 unspecified: Secondary | ICD-10-CM

## 2019-04-18 DIAGNOSIS — C539 Malignant neoplasm of cervix uteri, unspecified: Secondary | ICD-10-CM

## 2019-04-18 DIAGNOSIS — R110 Nausea: Secondary | ICD-10-CM

## 2019-04-18 DIAGNOSIS — Z510 Encounter for antineoplastic radiation therapy: Secondary | ICD-10-CM | POA: Diagnosis not present

## 2019-04-18 DIAGNOSIS — T451X5A Adverse effect of antineoplastic and immunosuppressive drugs, initial encounter: Secondary | ICD-10-CM

## 2019-04-18 MED ORDER — MAGNESIUM OXIDE 400 (241.3 MG) MG PO TABS: 400.0000 mg | ORAL_TABLET | Freq: Two times a day (BID) | ORAL | 1 refills | Status: DC

## 2019-04-18 MED FILL — MAGNESIUM OXIDE 400 MG TAB: 400 (240 MG | 30 days supply | Qty: 60 | Fill #0

## 2019-04-18 NOTE — Assessment & Plan Note (Signed)
Clinically, she appears to be responding to treatment She have minimal vaginal bleeding now Overall, she tolerated treatment fairly well We will continue weekly follow-up with cisplatin as scheduled

## 2019-04-18 NOTE — Assessment & Plan Note (Signed)
I encouraged her to take antiemetics as tolerated

## 2019-04-18 NOTE — Assessment & Plan Note (Signed)
Her hemoglobin is stable She does not need transfusion support this week

## 2019-04-18 NOTE — Assessment & Plan Note (Signed)
I recommend oral magnesium replacement therapy

## 2019-04-18 NOTE — Progress Notes (Signed)
[facility] OFFICE PROGRESS NOTE  Patient Care Team: [place], [place] as PCP - General (Family Medicine)  ASSESSMENT & PLAN:  Squamous cell carcinoma of cervix ([place]) Clinically, she appears to be responding to treatment She have minimal vaginal bleeding now Overall, she tolerated treatment fairly well We will continue weekly follow-up with cisplatin as scheduled  Abnormal vaginal bleeding Her hemoglobin is stable She does not need transfusion support this week  CKD (chronic kidney disease), symptom management only, stage 3 (moderate) Renal function continues to improve We will observe carefully The patient will continue aggressive hydration as tolerated  Hypomagnesemia I recommend oral magnesium replacement therapy  Chemotherapy-induced nausea I encouraged her to take antiemetics as tolerated   No orders of the defined types were placed in this encounter.   All questions were answered. The patient knows to call the clinic with any problems, questions or concerns. The total time spent in the appointment was 20 minutes encounter with patients including review of chart and various tests results, discussions about plan of care and coordination of care plan   [name], MD 04/18/2019 12:29 PM  INTERVAL HISTORY: Please see below for problem oriented charting. She is seen prior to cycle 3 of chemotherapy She tolerated treatment fairly well except for some nausea She had recent constipation No peripheral neuropathy Her vaginal bleeding is getting better She denies symptoms of dizziness She is able to hydrate herself adequately and eating normal despite some mild weight loss  SUMMARY OF ONCOLOGIC HISTORY: Oncology History  Squamous cell carcinoma of cervix ([place])  03/21/2019 Pathology Results   The biopsy is superficial and therefore depth of invasion cannot be  determined.  There is at least carcinoma in-situ.    03/23/2019 Initial Diagnosis    Squamous cell carcinoma of cervix ([place])   03/23/2019 Pathology Results   CERVIX, 11 O CLOCK, BIOPSY:  -  Squamous cell carcinoma, invasive  -  See comment   03/28/2019 PET scan   1. Hypermetabolic cervical mass measures roughly 7.6 by 6.9 by 4.9 cm, compatible with malignancy. No adenopathy or distant metastatic spread identified. 2. Extending cephalad and anterior to the uterine fundus, there is a 450 cubic cm simple fluid density structure without hypermetabolic activity. This may represent a right adnexal cyst (favored), peritoneal inclusion cyst, or (if the patient has a remote history of pancreatitis) a remote pseudocyst. 3. Faint ground density nodularity in both lower lobes which could be from atypical infection or extrinsic allergic alveolitis. Given nodular appearance of some of this ground-glass density, I would recommend follow up chest CT in 3 months time in order to ensure clearance. 4. Moderate cardiomegaly, cause uncertain. The patient also has advanced for age/gender atherosclerosis. Aortic Atherosclerosis (ICD10-I70.0).     04/04/2019 Procedure   Successful placement of a right internal jugular approach power injectable Port-A-Cath. The catheter is ready for immediate use   04/07/2019 -  Chemotherapy   The patient had weekly cisplatin for chemotherapy treatment.       REVIEW OF SYSTEMS:   Constitutional: Denies fevers, chills or abnormal weight loss Eyes: Denies blurriness of vision Ears, nose, mouth, throat, and face: Denies mucositis or sore throat Respiratory: Denies cough, dyspnea or wheezes Cardiovascular: Denies palpitation, chest discomfort or lower extremity swelling Skin: Denies abnormal skin rashes Lymphatics: Denies new lymphadenopathy or easy bruising Neurological:Denies numbness, tingling or new weaknesses Behavioral/Psych: Mood is stable, no new changes  All other systems were reviewed with the patient and are negative.  I have reviewed the past medical  history, past surgical history, social history and family history with the patient and they are unchanged from previous note.  ALLERGIES:  is allergic to penicillins; shellfish allergy; and sulfa antibiotics.  MEDICATIONS:  Current Outpatient Medications  Medication Sig Dispense Refill  . amLODipine (NORVASC) 10 MG tablet Take 1 tablet (10 mg total) by mouth daily. 90 tablet 0  . cholecalciferol (VITAMIN D3) 25 MCG (1000 UNIT) tablet Take 1,000 Units by mouth daily.    . cloNIDine (CATAPRES) 0.1 MG tablet Take 1 tablet (0.1 mg total) by mouth 2 (two) times daily as needed (blood pressure >180/110). 50 tablet 0  . diphenhydramine-acetaminophen (TYLENOL PM) 25-500 MG TABS tablet Take 1 tablet by mouth at bedtime as needed.    . fluticasone (FLONASE) 50 MCG/ACT nasal spray Place 1 spray into both nostrils daily as needed for allergies or rhinitis.    . hydrOXYzine (ATARAX/VISTARIL) 25 MG tablet Take 1 tablet (25 mg total) by mouth every 6 (six) hours as needed for anxiety. 20 tablet 0  . lidocaine-prilocaine (EMLA) cream Apply to affected area once 30 g 3  . loperamide (IMODIUM) 2 MG capsule Take by mouth as needed for diarrhea or loose stools.    Marland Kitchen LORazepam (ATIVAN) 0.5 MG tablet Take 1 tablet (0.5 mg total) by mouth every 8 (eight) hours as needed for anxiety. 30 tablet 0  . magnesium oxide (MAG-OX) 400 (241.3 Mg) MG tablet Take 1 tablet (400 mg total) by mouth 2 (two) times daily. 60 tablet 1  . ondansetron (ZOFRAN) 8 MG tablet Take 1 tablet (8 mg total) by mouth every 8 (eight) hours as needed. 30 tablet 1  . prochlorperazine (COMPAZINE) 10 MG tablet Take 1 tablet (10 mg total) by mouth every 6 (six) hours as needed (Nausea or vomiting). 30 tablet 1  . senna-docusate (SENOKOT-S) 8.6-50 MG tablet Take 2 tablets by mouth at bedtime. To prevent constipation, do not take if having loose stools 60 tablet 1   No current facility-administered medications for this visit.    PHYSICAL  EXAMINATION: ECOG PERFORMANCE STATUS: 1 - Symptomatic but completely ambulatory  Vitals:   04/18/19 1130  BP: (!) 145/72  Pulse: 100  Resp: 18  Temp: 98 F (36.7 C)  SpO2: 100%   Filed Weights   04/18/19 1130  Weight: 193 lb 9.6 oz (87.8 kg)    GENERAL:alert, no distress and comfortable SKIN: skin color, texture, turgor are normal, no rashes or significant lesions EYES: normal, Conjunctiva are pink and non-injected, sclera clear OROPHARYNX:no exudate, no erythema and lips, buccal mucosa, and tongue normal  NECK: supple, thyroid normal size, non-tender, without nodularity LYMPH:  no palpable lymphadenopathy in the cervical, axillary or inguinal LUNGS: clear to auscultation and percussion with normal breathing effort HEART: regular rate & rhythm and no murmurs and no lower extremity edema ABDOMEN:abdomen soft, non-tender and normal bowel sounds Musculoskeletal:no cyanosis of digits and no clubbing  NEURO: alert & oriented x 3 with fluent speech, no focal motor/sensory deficits  LABORATORY DATA:  I have reviewed the data as listed    Component Value Date/Time   NA 134 (L) 04/17/2019 1115   K 4.3 04/17/2019 1115   CL 100 04/17/2019 1115   CO2 25 04/17/2019 1115   GLUCOSE 100 (H) 04/17/2019 1115   BUN 23 (H) 04/17/2019 1115   CREATININE 1.16 (H) 04/17/2019 1115   CALCIUM 8.7 (L) 04/17/2019 1115   PROT 6.6 04/17/2019 1115   ALBUMIN 3.1 (L)  04/17/2019 1115   AST 8 (L) 04/17/2019 1115   ALT 19 04/17/2019 1115   ALKPHOS 78 04/17/2019 1115   BILITOT 0.5 04/17/2019 1115   GFRNONAA 58 (L) 04/17/2019 1115   GFRAA >60 04/17/2019 1115    No results found for: SPEP, UPEP  Lab Results  Component Value Date   WBC 11.3 (H) 04/17/2019   NEUTROABS 10.2 (H) 04/17/2019   HGB 8.6 (L) 04/17/2019   HCT 26.3 (L) 04/17/2019   MCV 79.2 (L) 04/17/2019   PLT 269 04/17/2019      Chemistry      Component Value Date/Time   NA 134 (L) 04/17/2019 1115   K 4.3 04/17/2019 1115   CL 100  04/17/2019 1115   CO2 25 04/17/2019 1115   BUN 23 (H) 04/17/2019 1115   CREATININE 1.16 (H) 04/17/2019 1115      Component Value Date/Time   CALCIUM 8.7 (L) 04/17/2019 1115   ALKPHOS 78 04/17/2019 1115   AST 8 (L) 04/17/2019 1115   ALT 19 04/17/2019 1115   BILITOT 0.5 04/17/2019 1115       RADIOGRAPHIC STUDIES: I have personally reviewed the radiological images as listed and agreed with the findings in the report. NM PET Image Initial (PI) Skull Base To Thigh  Result Date: 03/28/2019 CLINICAL DATA:  Initial treatment strategy for cervical cancer. EXAM: NUCLEAR MEDICINE PET SKULL BASE TO THIGH TECHNIQUE: 11.1 mCi F-18 FDG was injected intravenously. Full-ring PET imaging was performed from the skull base to thigh after the radiotracer. CT data was obtained and used for attenuation correction and anatomic localization. Fasting blood glucose: 98 mg/dl COMPARISON:  Transvaginal ultrasound 03/16/2019 2 FINDINGS: Mediastinal blood pool activity: SUV max 2.4 Liver activity: SUV max NA NECK: No significant abnormal hypermetabolic activity in this region. Incidental CT findings: none CHEST: 0.7 cm ground-glass density nodule in the left lower lobe on image 116/3. Adjacent 1.2 cm ground-glass density nodule in the left lower lobe on image 112/3. Subtle ground-glass density in the superior segment right lower lobe on image 93/3, difficult to measure due to indistinct margins. No significant associated accentuated metabolic activity. Incidental CT findings: Moderate cardiomegaly. Mild descending thoracic aortic atherosclerotic calcification. ABDOMEN/PELVIS: Hypermetabolic cervical mass noted, maximum SUV 23.9. Associated abnormal metabolic activity measures approximately 7.6 by 6.9 by 4.9 cm. No pathologically enlarged or hypermetabolic adenopathy observed in the abdomen or pelvis Incidental CT findings: Extending cephalad and anterior to the uterine fundus, and 11.3 by 8.3 by 9.2 cm (volume = 450 cm^3)  simple fluid density structure is present. This appears photopenic on the PET-CT. This may minimally abut the right adnexa but appears separate from the left ovary/adnexa. Aortoiliac atherosclerotic vascular disease. SKELETON: Symmetric low-grade activity throughout the skeleton without any corresponding lesion on the CT data, likely from granulocyte stimulation. Incidental CT findings: none IMPRESSION: 1. Hypermetabolic cervical mass measures roughly 7.6 by 6.9 by 4.9 cm, compatible with malignancy. No adenopathy or distant metastatic spread identified. 2. Extending cephalad and anterior to the uterine fundus, there is a 450 cubic cm simple fluid density structure without hypermetabolic activity. This may represent a right adnexal cyst (favored), peritoneal inclusion cyst, or (if the patient has a remote history of pancreatitis) a remote pseudocyst. 3. Faint ground density nodularity in both lower lobes which could be from atypical infection or extrinsic allergic alveolitis. Given nodular appearance of some of this ground-glass density, I would recommend follow up chest CT in 3 months time in order to ensure clearance. 4. Moderate cardiomegaly,  cause uncertain. The patient also has advanced for age/gender atherosclerosis. Aortic Atherosclerosis (ICD10-I70.0). Electronically Signed   By: Van Clines M.D.   On: 03/28/2019 15:22   IR IMAGING GUIDED PORT INSERTION  Result Date: 04/04/2019 INDICATION: History of cervical cancer. In need of durable intravenous access for chemotherapy administration. EXAM: IMPLANTED PORT A CATH PLACEMENT WITH ULTRASOUND AND FLUOROSCOPIC GUIDANCE COMPARISON:  PET-CT-03/28/2019 MEDICATIONS: Clindamycin 900 mg IV; The antibiotic was administered within an appropriate time interval prior to skin puncture. ANESTHESIA/SEDATION: Moderate (conscious) sedation was employed during this procedure. A total of Versed 4 mg and Fentanyl 100 mcg was administered intravenously. Moderate Sedation  Time: 23 minutes. The patient's level of consciousness and vital signs were monitored continuously by radiology nursing throughout the procedure under my direct supervision. CONTRAST:  None FLUOROSCOPY TIME:  36 seconds (3 mGy) COMPLICATIONS: None immediate. PROCEDURE: The procedure, risks, benefits, and alternatives were explained to the patient. Questions regarding the procedure were encouraged and answered. The patient understands and consents to the procedure. The right neck and chest were prepped with chlorhexidine in a sterile fashion, and a sterile drape was applied covering the operative field. Maximum barrier sterile technique with sterile gowns and gloves were used for the procedure. A timeout was performed prior to the initiation of the procedure. Local anesthesia was provided with 1% lidocaine with epinephrine. After creating a small venotomy incision, a micropuncture kit was utilized to access the internal jugular vein. Real-time ultrasound guidance was utilized for vascular access including the acquisition of a permanent ultrasound image documenting patency of the accessed vessel. The microwire was utilized to measure appropriate catheter length. A subcutaneous port pocket was then created along the upper chest wall utilizing a combination of sharp and blunt dissection. The pocket was irrigated with sterile saline. A single lumen "ISP" sized power injectable port was chosen for placement. The 8 Fr catheter was tunneled from the port pocket site to the venotomy incision. The port was placed in the pocket. The external catheter was trimmed to appropriate length. At the venotomy, an 8 Fr peel-away sheath was placed over a guidewire under fluoroscopic guidance. The catheter was then placed through the sheath and the sheath was removed. Final catheter positioning was confirmed and documented with a fluoroscopic spot radiograph. The port was accessed with a Huber needle, aspirated and flushed with  heparinized saline. The venotomy site was closed with an interrupted 4-0 Vicryl suture. The port pocket incision was closed with interrupted 2-0 Vicryl suture and the skin was opposed with a running subcuticular 4-0 Vicryl suture. Dermabond and Steri-strips were applied to both incisions. Dressings were placed. The patient tolerated the procedure well without immediate post procedural complication. FINDINGS: After catheter placement, the tip lies within the superior cavoatrial junction. The catheter aspirates and flushes normally and is ready for immediate use. IMPRESSION: Successful placement of a right internal jugular approach power injectable Port-A-Cath. The catheter is ready for immediate use. Electronically Signed   By: Sandi Mariscal M.D.   On: 04/04/2019 12:09

## 2019-04-18 NOTE — Assessment & Plan Note (Signed)
Renal function continues to improve We will observe carefully The patient will continue aggressive hydration as tolerated

## 2019-04-19 ENCOUNTER — Other Ambulatory Visit: Payer: Self-pay

## 2019-04-19 ENCOUNTER — Ambulatory Visit
Admission: RE | Admit: 2019-04-19 | Discharge: 2019-04-19 | Disposition: A | Discharge status: Home / Self Care | Payer: Medicaid Other | Source: Ambulatory Visit | Attending: Radiation Oncology | Admitting: Radiation Oncology

## 2019-04-19 DIAGNOSIS — Z510 Encounter for antineoplastic radiation therapy: Secondary | ICD-10-CM | POA: Diagnosis not present

## 2019-04-19 NOTE — Progress Notes (Signed)
Counseling intern left patient a voicemail on 04/19/19 in response to a voicemail asking to schedule a counseling session. CI provided the pt with several dates and times that are available this week and asked her to return the call to schedule a counseling session.   Art Buff [place] Counseling Intern Voicemail:  [phone]

## 2019-04-20 ENCOUNTER — Ambulatory Visit: Payer: Medicaid Other

## 2019-04-21 ENCOUNTER — Inpatient Hospital Stay: Payer: Medicaid Other

## 2019-04-21 ENCOUNTER — Ambulatory Visit
Admission: RE | Admit: 2019-04-21 | Discharge: 2019-04-21 | Disposition: A | Discharge status: Home / Self Care | Payer: Medicaid Other | Source: Ambulatory Visit | Attending: Radiation Oncology | Admitting: Radiation Oncology

## 2019-04-21 ENCOUNTER — Encounter: Payer: Self-pay | Admitting: General Practice

## 2019-04-21 ENCOUNTER — Other Ambulatory Visit: Payer: Self-pay

## 2019-04-21 VITALS — BP 148/81 | HR 93 | Temp 98.3°F | Resp 17 | Ht 62.0 in | Wt 198.4 lb

## 2019-04-21 DIAGNOSIS — Z5111 Encounter for antineoplastic chemotherapy: Secondary | ICD-10-CM | POA: Diagnosis not present

## 2019-04-21 DIAGNOSIS — Z510 Encounter for antineoplastic radiation therapy: Secondary | ICD-10-CM | POA: Diagnosis not present

## 2019-04-21 DIAGNOSIS — C539 Malignant neoplasm of cervix uteri, unspecified: Secondary | ICD-10-CM

## 2019-04-21 MED ORDER — SODIUM CHLORIDE 0.9 % IV SOLN
150.0000 mg | Freq: Once | INTRAVENOUS | Status: AC
  Administered 2019-04-21: 150 mg via INTRAVENOUS
  Filled 2019-04-21: qty 150

## 2019-04-21 MED ORDER — POTASSIUM CHLORIDE 2 MEQ/ML IV SOLN
Freq: Once | INTRAVENOUS | Status: AC
  Filled 2019-04-21: qty 10

## 2019-04-21 MED ORDER — SODIUM CHLORIDE 0.9% FLUSH
10.0000 mL | INTRAVENOUS | Status: DC | PRN
  Administered 2019-04-21: 17:00:00 10 mL
  Filled 2019-04-21: qty 10

## 2019-04-21 MED ORDER — HEPARIN SOD (PORK) LOCK FLUSH 100 UNIT/ML IV SOLN
500.0000 [IU] | Freq: Once | INTRAVENOUS | Status: AC | PRN
  Administered 2019-04-21: 17:00:00 500 [IU]
  Filled 2019-04-21: qty 5

## 2019-04-21 MED ORDER — DEXAMETHASONE SODIUM PHOSPHATE 10 MG/ML IJ SOLN
10.0000 mg | Freq: Once | INTRAMUSCULAR | Status: AC
  Administered 2019-04-21: 13:00:00 10 mg via INTRAVENOUS

## 2019-04-21 MED ORDER — PALONOSETRON HCL INJECTION 0.25 MG/5ML
0.2500 mg | Freq: Once | INTRAVENOUS | Status: AC
  Administered 2019-04-21: 13:00:00 0.25 mg via INTRAVENOUS

## 2019-04-21 MED ORDER — SODIUM CHLORIDE 0.9 % IV SOLN
Freq: Once | INTRAVENOUS | Status: AC
  Filled 2019-04-21: qty 250

## 2019-04-21 MED ORDER — SODIUM CHLORIDE 0.9 % IV SOLN
30.0000 mg/m2 | Freq: Once | INTRAVENOUS | Status: AC
  Administered 2019-04-21: 14:00:00 60 mg via INTRAVENOUS
  Filled 2019-04-21: qty 60

## 2019-04-21 MED ORDER — DEXAMETHASONE SODIUM PHOSPHATE 10 MG/ML IJ SOLN
INTRAMUSCULAR | Status: AC
  Filled 2019-04-21: qty 1

## 2019-04-21 MED ORDER — PALONOSETRON HCL INJECTION 0.25 MG/5ML
INTRAVENOUS | Status: AC
  Filled 2019-04-21: qty 5

## 2019-04-21 NOTE — Patient Instructions (Signed)
[facility] Discharge Instructions for Patients Receiving Chemotherapy  Today you received the following chemotherapy agents: Cisplatin (Platinol)  To help prevent nausea and vomiting after your treatment, we encourage you to take your nausea medication as directed.   If you develop nausea and vomiting that is not controlled by your nausea medication, call the clinic.   BELOW ARE SYMPTOMS THAT SHOULD BE REPORTED IMMEDIATELY:  *FEVER GREATER THAN 100.5 F  *CHILLS WITH OR WITHOUT FEVER  NAUSEA AND VOMITING THAT IS NOT CONTROLLED WITH YOUR NAUSEA MEDICATION  *UNUSUAL SHORTNESS OF BREATH  *UNUSUAL BRUISING OR BLEEDING  TENDERNESS IN MOUTH AND THROAT WITH OR WITHOUT PRESENCE OF ULCERS  *URINARY PROBLEMS  *BOWEL PROBLEMS  UNUSUAL RASH Items with * indicate a potential emergency and should be followed up as soon as possible.  Feel free to call the clinic should you have any questions or concerns. The clinic phone number is [phone].  Please show the [facility] at check-in to the Emergency Department and triage nurse.  Coronavirus (COVID-19) Are you at risk?  Are you at risk for the Coronavirus (COVID-19)?  To be considered HIGH RISK for Coronavirus (COVID-19), you have to meet the following criteria:  . Traveled to [place], [place], [place], [place] or [place]; or in the [place] to [place], [place], [place], or [place]; and have fever, cough, and shortness of breath within the last 2 weeks of travel OR . Been in close contact with a person diagnosed with COVID-19 within the last 2 weeks and have fever, cough, and shortness of breath . IF YOU DO NOT MEET THESE CRITERIA, YOU ARE CONSIDERED LOW RISK FOR COVID-19.  What to do if you are HIGH RISK for COVID-19?  Marland Kitchen If you are having a medical emergency, call 911. . Seek medical care right away. Before you go to a doctor's office, urgent care or emergency department, call ahead and tell them  about your recent travel, contact with someone diagnosed with COVID-19, and your symptoms. You should receive instructions from your physician's office regarding next steps of care.  . When you arrive at healthcare provider, tell the healthcare staff immediately you have returned from visiting [place], [place], [place], [place] or [place]; or traveled in the [place] to [place], [place], [place], or [place]; in the last two weeks or you have been in close contact with a person diagnosed with COVID-19 in the last 2 weeks.   . Tell the health care staff about your symptoms: fever, cough and shortness of breath. . After you have been seen by a medical provider, you will be either: o Tested for (COVID-19) and discharged home on quarantine except to seek medical care if symptoms worsen, and asked to  - Stay home and avoid contact with others until you get your results (4-5 days)  - Avoid travel on public transportation if possible (such as bus, train, or airplane) or o Sent to the Emergency Department by EMS for evaluation, COVID-19 testing, and possible admission depending on your condition and test results.  What to do if you are LOW RISK for COVID-19?  Reduce your risk of any infection by using the same precautions used for avoiding the common cold or flu:  Marland Kitchen Wash your hands often with soap and warm water for at least 20 seconds.  If soap and water are not readily available, use an alcohol-based hand sanitizer with at least 60% alcohol.  . If coughing or  sneezing, cover your mouth and nose by coughing or sneezing into the elbow areas of your shirt or coat, into a tissue or into your sleeve (not your hands). . Avoid shaking hands with others and consider head nods or verbal greetings only. . Avoid touching your eyes, nose, or mouth with unwashed hands.  . Avoid close contact with people who are sick. . Avoid places or events with large numbers of people in one location, like concerts or  sporting events. . Carefully consider travel plans you have or are making. . If you are planning any travel outside or inside the [place], visit the CDC's Travelers' Health webpage for the latest health notices. . If you have some symptoms but not all symptoms, continue to monitor at home and seek medical attention if your symptoms worsen. . If you are having a medical emergency, call 911.   [facility] / e-Visit: [email or web address]         [facility]: [facility]: [phone]                   [facility]: [phone]

## 2019-04-21 NOTE — Progress Notes (Signed)
[facility] Spiritual Care Note  Followedup with Tamara Osborne in infusion as planned. She continues to become more comfortable with her treatment and schedule, which is helping decrease her anxiety. Connecting with "kind and cheerful" staff, meanwhile, helps buoy her spirits. She values pastoral check-ins and continues to work toward setting up a counseling appointment with our intern Art Buff for regular support.   [place], [place], [facility] Pager [phone] Voicemail [phone]

## 2019-04-24 ENCOUNTER — Other Ambulatory Visit: Payer: Self-pay | Admitting: Hematology and Oncology

## 2019-04-24 ENCOUNTER — Ambulatory Visit
Admission: RE | Admit: 2019-04-24 | Discharge: 2019-04-24 | Disposition: A | Discharge status: Home / Self Care | Payer: Medicaid Other | Source: Ambulatory Visit | Attending: Radiation Oncology | Admitting: Radiation Oncology

## 2019-04-24 ENCOUNTER — Inpatient Hospital Stay: Payer: Medicaid Other

## 2019-04-24 ENCOUNTER — Other Ambulatory Visit: Payer: Self-pay

## 2019-04-24 ENCOUNTER — Telehealth: Payer: Self-pay

## 2019-04-24 DIAGNOSIS — C539 Malignant neoplasm of cervix uteri, unspecified: Secondary | ICD-10-CM

## 2019-04-24 DIAGNOSIS — N939 Abnormal uterine and vaginal bleeding, unspecified: Secondary | ICD-10-CM

## 2019-04-24 DIAGNOSIS — Z510 Encounter for antineoplastic radiation therapy: Secondary | ICD-10-CM | POA: Diagnosis not present

## 2019-04-24 DIAGNOSIS — Z5111 Encounter for antineoplastic chemotherapy: Secondary | ICD-10-CM | POA: Diagnosis not present

## 2019-04-24 LAB — COMPREHENSIVE METABOLIC PANEL
ALT: 19 U/L (ref 0–44)
AST: 8 U/L — ABNORMAL LOW (ref 15–41)
Albumin: 3.1 g/dL — ABNORMAL LOW (ref 3.5–5.0)
Alkaline Phosphatase: 77 U/L (ref 38–126)
Anion gap: 9 (ref 5–15)
BUN: 27 mg/dL — ABNORMAL HIGH (ref 6–20)
CO2: 25 mmol/L (ref 22–32)
Calcium: 8.3 mg/dL — ABNORMAL LOW (ref 8.9–10.3)
Chloride: 105 mmol/L (ref 98–111)
Creatinine, Ser: 1.28 mg/dL — ABNORMAL HIGH (ref 0.44–1.00)
GFR calc Af Amer: 60 mL/min — ABNORMAL LOW (ref >60)
GFR calc non Af Amer: 52 mL/min — ABNORMAL LOW (ref >60)
Glucose, Bld: 105 mg/dL — ABNORMAL HIGH (ref 70–99)
Potassium: 4.2 mmol/L (ref 3.5–5.1)
Sodium: 139 mmol/L (ref 135–145)
Total Bilirubin: 0.3 mg/dL (ref 0.3–1.2)
Total Protein: 6.5 g/dL (ref 6.5–8.1)

## 2019-04-24 LAB — CBC WITH DIFFERENTIAL/PLATELET
Abs Immature Granulocytes: 0.02 10*3/uL (ref 0.00–0.07)
Basophils Absolute: 0 10*3/uL (ref 0.0–0.1)
Basophils Relative: 0 %
Eosinophils Absolute: 0.3 10*3/uL (ref 0.0–0.5)
Eosinophils Relative: 5 %
HCT: 22.8 % — ABNORMAL LOW (ref 36.0–46.0)
Hemoglobin: 7.3 g/dL — ABNORMAL LOW (ref 12.0–15.0)
Immature Granulocytes: 0 %
Lymphocytes Relative: 4 %
Lymphs Abs: 0.2 10*3/uL — ABNORMAL LOW (ref 0.7–4.0)
MCH: 26.4 pg (ref 26.0–34.0)
MCHC: 32 g/dL (ref 30.0–36.0)
MCV: 82.6 fL (ref 80.0–100.0)
Monocytes Absolute: 0.3 10*3/uL (ref 0.1–1.0)
Monocytes Relative: 6 %
Neutro Abs: 4.3 10*3/uL (ref 1.7–7.7)
Neutrophils Relative %: 85 %
Platelets: 158 10*3/uL (ref 150–400)
RBC: 2.76 MIL/uL — ABNORMAL LOW (ref 3.87–5.11)
RDW: 22.4 % — ABNORMAL HIGH (ref 11.5–15.5)
WBC: 5.2 10*3/uL (ref 4.0–10.5)
nRBC: 0 % (ref 0.0–0.2)

## 2019-04-24 LAB — SAMPLE TO BLOOD BANK

## 2019-04-24 LAB — PREPARE RBC (CROSSMATCH)

## 2019-04-24 LAB — MAGNESIUM: Magnesium: 1.8 mg/dL (ref 1.7–2.4)

## 2019-04-24 MED ORDER — SODIUM CHLORIDE 0.9% FLUSH
10.0000 mL | Freq: Once | INTRAVENOUS | Status: AC
  Administered 2019-04-24: 10 mL
  Filled 2019-04-24: qty 10

## 2019-04-24 MED ORDER — HEPARIN SOD (PORK) LOCK FLUSH 100 UNIT/ML IV SOLN
250.0000 [IU] | Freq: Once | INTRAVENOUS | Status: AC
  Administered 2019-04-24: 250 [IU]
  Filled 2019-04-24: qty 5

## 2019-04-24 NOTE — Progress Notes (Signed)
Counseling intern spoke to patient on 04/24/19 via phone for an intake counseling appointment. CI provided therapeutic listening, empathy, and normalization of feelings. Pt stated she experienced anxiety during first months after her cancer diagnosis, but has noticed a significant decrease in her anxiety in the past few weeks Pt attributes this to having more information about her dx and tx to this improvement. Pt stated she values her independence and having to depend on others after her surgery was difficult for her. Pt is glad to be able to drive herself to and from appointments. Pt stated she previously saw a counselor after her mother died to deal with her grief and found it was extremely helpful.  Pt also stated she has a history of depression and PTSD. Pt stated that she is currently not taking the anti-anxiety medication her doctor prescribed in January because it made her sleepy and she feels that now she is able to deal with anxiety without it. Pt is grateful for the wonderful care she is receiving at [facility] and looks forward to joining a support group to give her a space to talk to others who are going through a similar experience. She also hopes to try some art activities to give her some creative distraction from her cancer.   Next Counseling Session: Monday, May 01, 2019 at 2:00pm  Art Buff [place] Counseling Intern Voicemail:  [phone]

## 2019-04-24 NOTE — Telephone Encounter (Signed)
Called and instructed to leave blood bracelet on. Given hemoglobin results. Dr. Alvy Bimler is sending a scheduling message for appt and is ordering 2 units of blood for Wednesday. She verbalized understanding. She had a fall in the yard over the weekend and seems to be bleeding a lttle like before vaginally. No bruising from the fall that she can see. Instructed to call the office is needed.

## 2019-04-25 ENCOUNTER — Encounter: Payer: Self-pay | Admitting: Hematology and Oncology

## 2019-04-25 ENCOUNTER — Ambulatory Visit
Admission: RE | Admit: 2019-04-25 | Discharge: 2019-04-25 | Disposition: A | Discharge status: Home / Self Care | Payer: Medicaid Other | Source: Ambulatory Visit | Attending: Radiation Oncology | Admitting: Radiation Oncology

## 2019-04-25 ENCOUNTER — Inpatient Hospital Stay (HOSPITAL_BASED_OUTPATIENT_CLINIC_OR_DEPARTMENT_OTHER): Payer: Medicaid Other | Admitting: Hematology and Oncology

## 2019-04-25 ENCOUNTER — Other Ambulatory Visit: Payer: Self-pay

## 2019-04-25 ENCOUNTER — Other Ambulatory Visit (HOSPITAL_COMMUNITY): Payer: Self-pay | Admitting: Radiation Oncology

## 2019-04-25 DIAGNOSIS — Z510 Encounter for antineoplastic radiation therapy: Secondary | ICD-10-CM | POA: Diagnosis not present

## 2019-04-25 DIAGNOSIS — N183 Chronic kidney disease, stage 3 unspecified: Secondary | ICD-10-CM | POA: Diagnosis not present

## 2019-04-25 DIAGNOSIS — T451X5A Adverse effect of antineoplastic and immunosuppressive drugs, initial encounter: Secondary | ICD-10-CM

## 2019-04-25 DIAGNOSIS — C539 Malignant neoplasm of cervix uteri, unspecified: Secondary | ICD-10-CM

## 2019-04-25 DIAGNOSIS — G620 Drug-induced polyneuropathy: Secondary | ICD-10-CM | POA: Insufficient documentation

## 2019-04-25 DIAGNOSIS — R110 Nausea: Secondary | ICD-10-CM

## 2019-04-25 DIAGNOSIS — N939 Abnormal uterine and vaginal bleeding, unspecified: Secondary | ICD-10-CM | POA: Diagnosis not present

## 2019-04-25 DIAGNOSIS — Z5111 Encounter for antineoplastic chemotherapy: Secondary | ICD-10-CM | POA: Diagnosis not present

## 2019-04-25 HISTORY — DX: Other specified postprocedural states: R11.2

## 2019-04-25 HISTORY — DX: Other specified postprocedural states: Z98.890

## 2019-04-25 NOTE — Assessment & Plan Note (Signed)
Renal function continues to improve We will observe carefully The patient will continue aggressive hydration as tolerated

## 2019-04-25 NOTE — Assessment & Plan Note (Signed)
she has mild peripheral neuropathy, likely related to side effects of treatment. It is only mild, not bothering the patient. I will observe for now If it gets worse in the future, I will consider modifying the dose of the treatment  

## 2019-04-25 NOTE — Assessment & Plan Note (Signed)
Clinically, she appears to be responding to treatment She have minimal vaginal bleeding now Overall, she tolerated treatment fairly well except for intermittent diarrhea, nausea, very mild neuropathy and severe anemia I will continue to see her on a weekly basis for follow-up and supportive care

## 2019-04-25 NOTE — Assessment & Plan Note (Signed)
We discussed some of the risks, benefits, and alternatives of blood transfusions. The patient is symptomatic from anemia and the hemoglobin level is critically low.  Some of the side-effects to be expected including risks of transfusion reactions, chills, infection, syndrome of volume overload and risk of hospitalization from various reasons and the patient is willing to proceed and went ahead to sign consent today. I recommend 2 units of blood transfusion tomorrow

## 2019-04-25 NOTE — Progress Notes (Signed)
[facility] OFFICE PROGRESS NOTE  Patient Care Team: [place], Dugway as PCP - General (Family Medicine)  ASSESSMENT & PLAN:  Squamous cell carcinoma of cervix ([place]) Clinically, she appears to be responding to treatment She have minimal vaginal bleeding now Overall, she tolerated treatment fairly well except for intermittent diarrhea, nausea, very mild neuropathy and severe anemia I will continue to see her on a weekly basis for follow-up and supportive care  Abnormal vaginal bleeding We discussed some of the risks, benefits, and alternatives of blood transfusions. The patient is symptomatic from anemia and the hemoglobin level is critically low.  Some of the side-effects to be expected including risks of transfusion reactions, chills, infection, syndrome of volume overload and risk of hospitalization from various reasons and the patient is willing to proceed and went ahead to sign consent today. I recommend 2 units of blood transfusion tomorrow  CKD (chronic kidney disease), symptom management only, stage 3 (moderate) Renal function continues to improve We will observe carefully The patient will continue aggressive hydration as tolerated  Chemotherapy-induced nausea I encouraged her to take antiemetics as tolerated  Peripheral neuropathy due to chemotherapy [facility]) she has mild peripheral neuropathy, likely related to side effects of treatment. It is only mild, not bothering the patient. I will observe for now If it gets worse in the future, I will consider modifying the dose of the treatment    No orders of the defined types were placed in this encounter.   All questions were answered. The patient knows to call the clinic with any problems, questions or concerns. The total time spent in the appointment was 30 minutes encounter with patients including review of chart and various tests results, discussions about plan of care and coordination of care  plan   [name], MD 04/25/2019 2:04 PM  INTERVAL HISTORY: Please see below for problem oriented charting. She returns for her weekly follow-up She had minimal vaginal bleeding now She has some nausea but no vomiting She has occasional loose bowel movement She fell on Sunday but it was an accidental fall She did not have major injuries She have very slight peripheral neuropathy at the tips of her fingers but it does not bother her She complains of fatigue  SUMMARY OF ONCOLOGIC HISTORY: Oncology History  Squamous cell carcinoma of cervix ([place])  03/21/2019 Pathology Results   The biopsy is superficial and therefore depth of invasion cannot be  determined.  There is at least carcinoma in-situ.    03/23/2019 Initial Diagnosis   Squamous cell carcinoma of cervix ([place])   03/23/2019 Pathology Results   CERVIX, 11 O CLOCK, BIOPSY:  -  Squamous cell carcinoma, invasive  -  See comment   03/28/2019 PET scan   1. Hypermetabolic cervical mass measures roughly 7.6 by 6.9 by 4.9 cm, compatible with malignancy. No adenopathy or distant metastatic spread identified. 2. Extending cephalad and anterior to the uterine fundus, there is a 450 cubic cm simple fluid density structure without hypermetabolic activity. This may represent a right adnexal cyst (favored), peritoneal inclusion cyst, or (if the patient has a remote history of pancreatitis) a remote pseudocyst. 3. Faint ground density nodularity in both lower lobes which could be from atypical infection or extrinsic allergic alveolitis. Given nodular appearance of some of this ground-glass density, I would recommend follow up chest CT in 3 months time in order to ensure clearance. 4. Moderate cardiomegaly, cause uncertain. The patient also has advanced for age/gender atherosclerosis. Aortic Atherosclerosis (  ICD10-I70.0).     04/04/2019 Procedure   Successful placement of a right internal jugular approach power injectable Port-A-Cath. The catheter  is ready for immediate use   04/07/2019 -  Chemotherapy   The patient had weekly cisplatin for chemotherapy treatment.       REVIEW OF SYSTEMS:   Constitutional: Denies fevers, chills or abnormal weight loss Eyes: Denies blurriness of vision Ears, nose, mouth, throat, and face: Denies mucositis or sore throat Respiratory: Denies cough, dyspnea or wheezes Cardiovascular: Denies palpitation, chest discomfort  Skin: Denies abnormal skin rashes Lymphatics: Denies new lymphadenopathy or easy bruising Behavioral/Psych: Mood is stable, no new changes  All other systems were reviewed with the patient and are negative.  I have reviewed the past medical history, past surgical history, social history and family history with the patient and they are unchanged from previous note.  ALLERGIES:  is allergic to penicillins; shellfish allergy; and sulfa antibiotics.  MEDICATIONS:  Current Outpatient Medications  Medication Sig Dispense Refill  . amLODipine (NORVASC) 10 MG tablet Take 1 tablet (10 mg total) by mouth daily. 90 tablet 0  . cholecalciferol (VITAMIN D3) 25 MCG (1000 UNIT) tablet Take 1,000 Units by mouth daily.    . cloNIDine (CATAPRES) 0.1 MG tablet Take 1 tablet (0.1 mg total) by mouth 2 (two) times daily as needed (blood pressure >180/110). 50 tablet 0  . diphenhydramine-acetaminophen (TYLENOL PM) 25-500 MG TABS tablet Take 1 tablet by mouth at bedtime as needed.    . fluticasone (FLONASE) 50 MCG/ACT nasal spray Place 1 spray into both nostrils daily as needed for allergies or rhinitis.    . hydrOXYzine (ATARAX/VISTARIL) 25 MG tablet Take 1 tablet (25 mg total) by mouth every 6 (six) hours as needed for anxiety. 20 tablet 0  . lidocaine-prilocaine (EMLA) cream Apply to affected area once 30 g 3  . loperamide (IMODIUM) 2 MG capsule Take by mouth as needed for diarrhea or loose stools.    Marland Kitchen LORazepam (ATIVAN) 0.5 MG tablet Take 1 tablet (0.5 mg total) by mouth every 8 (eight) hours as  needed for anxiety. 30 tablet 0  . magnesium oxide (MAG-OX) 400 (241.3 Mg) MG tablet Take 1 tablet (400 mg total) by mouth 2 (two) times daily. 60 tablet 1  . ondansetron (ZOFRAN) 8 MG tablet Take 1 tablet (8 mg total) by mouth every 8 (eight) hours as needed. 30 tablet 1  . prochlorperazine (COMPAZINE) 10 MG tablet Take 1 tablet (10 mg total) by mouth every 6 (six) hours as needed (Nausea or vomiting). 30 tablet 1  . senna-docusate (SENOKOT-S) 8.6-50 MG tablet Take 2 tablets by mouth at bedtime. To prevent constipation, do not take if having loose stools 60 tablet 1   No current facility-administered medications for this visit.    PHYSICAL EXAMINATION: ECOG PERFORMANCE STATUS: 1 - Symptomatic but completely ambulatory  Vitals:   04/25/19 1125  BP: (!) 154/71  Pulse: (!) 101  Resp: 18  Temp: 97.8 F (36.6 C)  SpO2: 99%   Filed Weights   04/25/19 1125  Weight: 199 lb 3.2 oz (90.4 kg)    GENERAL:alert, no distress and comfortable SKIN: skin color, texture, turgor are normal, no rashes or significant lesions.  She looks a bit pale EYES: normal, Conjunctiva are pink and non-injected, sclera clear OROPHARYNX:no exudate, no erythema and lips, buccal mucosa, and tongue normal  NECK: supple, thyroid normal size, non-tender, without nodularity LYMPH:  no palpable lymphadenopathy in the cervical, axillary or inguinal LUNGS: clear to  auscultation and percussion with normal breathing effort HEART: regular rate & rhythm and no murmurs with minimum lower extremity edema ABDOMEN:abdomen soft, non-tender and normal bowel sounds Musculoskeletal:no cyanosis of digits and no clubbing  NEURO: alert & oriented x 3 with fluent speech, no focal motor/sensory deficits  LABORATORY DATA:  I have reviewed the data as listed    Component Value Date/Time   NA 139 04/24/2019 1150   K 4.2 04/24/2019 1150   CL 105 04/24/2019 1150   CO2 25 04/24/2019 1150   GLUCOSE 105 (H) 04/24/2019 1150   BUN 27 (H)  04/24/2019 1150   CREATININE 1.28 (H) 04/24/2019 1150   CALCIUM 8.3 (L) 04/24/2019 1150   PROT 6.5 04/24/2019 1150   ALBUMIN 3.1 (L) 04/24/2019 1150   AST 8 (L) 04/24/2019 1150   ALT 19 04/24/2019 1150   ALKPHOS 77 04/24/2019 1150   BILITOT 0.3 04/24/2019 1150   GFRNONAA 52 (L) 04/24/2019 1150   GFRAA 60 (L) 04/24/2019 1150    No results found for: SPEP, UPEP  Lab Results  Component Value Date   WBC 5.2 04/24/2019   NEUTROABS 4.3 04/24/2019   HGB 7.3 (L) 04/24/2019   HCT 22.8 (L) 04/24/2019   MCV 82.6 04/24/2019   PLT 158 04/24/2019      Chemistry      Component Value Date/Time   NA 139 04/24/2019 1150   K 4.2 04/24/2019 1150   CL 105 04/24/2019 1150   CO2 25 04/24/2019 1150   BUN 27 (H) 04/24/2019 1150   CREATININE 1.28 (H) 04/24/2019 1150      Component Value Date/Time   CALCIUM 8.3 (L) 04/24/2019 1150   ALKPHOS 77 04/24/2019 1150   AST 8 (L) 04/24/2019 1150   ALT 19 04/24/2019 1150   BILITOT 0.3 04/24/2019 1150       RADIOGRAPHIC STUDIES: I have personally reviewed the radiological images as listed and agreed with the findings in the report. NM PET Image Initial (PI) Skull Base To Thigh  Result Date: 03/28/2019 CLINICAL DATA:  Initial treatment strategy for cervical cancer. EXAM: NUCLEAR MEDICINE PET SKULL BASE TO THIGH TECHNIQUE: 11.1 mCi F-18 FDG was injected intravenously. Full-ring PET imaging was performed from the skull base to thigh after the radiotracer. CT data was obtained and used for attenuation correction and anatomic localization. Fasting blood glucose: 98 mg/dl COMPARISON:  Transvaginal ultrasound 03/16/2019 2 FINDINGS: Mediastinal blood pool activity: SUV max 2.4 Liver activity: SUV max NA NECK: No significant abnormal hypermetabolic activity in this region. Incidental CT findings: none CHEST: 0.7 cm ground-glass density nodule in the left lower lobe on image 116/3. Adjacent 1.2 cm ground-glass density nodule in the left lower lobe on image 112/3.  Subtle ground-glass density in the superior segment right lower lobe on image 93/3, difficult to measure due to indistinct margins. No significant associated accentuated metabolic activity. Incidental CT findings: Moderate cardiomegaly. Mild descending thoracic aortic atherosclerotic calcification. ABDOMEN/PELVIS: Hypermetabolic cervical mass noted, maximum SUV 23.9. Associated abnormal metabolic activity measures approximately 7.6 by 6.9 by 4.9 cm. No pathologically enlarged or hypermetabolic adenopathy observed in the abdomen or pelvis Incidental CT findings: Extending cephalad and anterior to the uterine fundus, and 11.3 by 8.3 by 9.2 cm (volume = 450 cm^3) simple fluid density structure is present. This appears photopenic on the PET-CT. This may minimally abut the right adnexa but appears separate from the left ovary/adnexa. Aortoiliac atherosclerotic vascular disease. SKELETON: Symmetric low-grade activity throughout the skeleton without any corresponding lesion on the CT  data, likely from granulocyte stimulation. Incidental CT findings: none IMPRESSION: 1. Hypermetabolic cervical mass measures roughly 7.6 by 6.9 by 4.9 cm, compatible with malignancy. No adenopathy or distant metastatic spread identified. 2. Extending cephalad and anterior to the uterine fundus, there is a 450 cubic cm simple fluid density structure without hypermetabolic activity. This may represent a right adnexal cyst (favored), peritoneal inclusion cyst, or (if the patient has a remote history of pancreatitis) a remote pseudocyst. 3. Faint ground density nodularity in both lower lobes which could be from atypical infection or extrinsic allergic alveolitis. Given nodular appearance of some of this ground-glass density, I would recommend follow up chest CT in 3 months time in order to ensure clearance. 4. Moderate cardiomegaly, cause uncertain. The patient also has advanced for age/gender atherosclerosis. Aortic Atherosclerosis (ICD10-I70.0).  Electronically Signed   By: Van Clines M.D.   On: 03/28/2019 15:22   IR IMAGING GUIDED PORT INSERTION  Result Date: 04/04/2019 INDICATION: History of cervical cancer. In need of durable intravenous access for chemotherapy administration. EXAM: IMPLANTED PORT A CATH PLACEMENT WITH ULTRASOUND AND FLUOROSCOPIC GUIDANCE COMPARISON:  PET-CT-03/28/2019 MEDICATIONS: Clindamycin 900 mg IV; The antibiotic was administered within an appropriate time interval prior to skin puncture. ANESTHESIA/SEDATION: Moderate (conscious) sedation was employed during this procedure. A total of Versed 4 mg and Fentanyl 100 mcg was administered intravenously. Moderate Sedation Time: 23 minutes. The patient's level of consciousness and vital signs were monitored continuously by radiology nursing throughout the procedure under my direct supervision. CONTRAST:  None FLUOROSCOPY TIME:  36 seconds (3 mGy) COMPLICATIONS: None immediate. PROCEDURE: The procedure, risks, benefits, and alternatives were explained to the patient. Questions regarding the procedure were encouraged and answered. The patient understands and consents to the procedure. The right neck and chest were prepped with chlorhexidine in a sterile fashion, and a sterile drape was applied covering the operative field. Maximum barrier sterile technique with sterile gowns and gloves were used for the procedure. A timeout was performed prior to the initiation of the procedure. Local anesthesia was provided with 1% lidocaine with epinephrine. After creating a small venotomy incision, a micropuncture kit was utilized to access the internal jugular vein. Real-time ultrasound guidance was utilized for vascular access including the acquisition of a permanent ultrasound image documenting patency of the accessed vessel. The microwire was utilized to measure appropriate catheter length. A subcutaneous port pocket was then created along the upper chest wall utilizing a combination of  sharp and blunt dissection. The pocket was irrigated with sterile saline. A single lumen "ISP" sized power injectable port was chosen for placement. The 8 Fr catheter was tunneled from the port pocket site to the venotomy incision. The port was placed in the pocket. The external catheter was trimmed to appropriate length. At the venotomy, an 8 Fr peel-away sheath was placed over a guidewire under fluoroscopic guidance. The catheter was then placed through the sheath and the sheath was removed. Final catheter positioning was confirmed and documented with a fluoroscopic spot radiograph. The port was accessed with a Huber needle, aspirated and flushed with heparinized saline. The venotomy site was closed with an interrupted 4-0 Vicryl suture. The port pocket incision was closed with interrupted 2-0 Vicryl suture and the skin was opposed with a running subcuticular 4-0 Vicryl suture. Dermabond and Steri-strips were applied to both incisions. Dressings were placed. The patient tolerated the procedure well without immediate post procedural complication. FINDINGS: After catheter placement, the tip lies within the superior cavoatrial junction. The catheter aspirates  and flushes normally and is ready for immediate use. IMPRESSION: Successful placement of a right internal jugular approach power injectable Port-A-Cath. The catheter is ready for immediate use. Electronically Signed   By: Sandi Mariscal M.D.   On: 04/04/2019 12:09

## 2019-04-25 NOTE — Assessment & Plan Note (Signed)
I encouraged her to take antiemetics as tolerated

## 2019-04-26 ENCOUNTER — Other Ambulatory Visit: Payer: Self-pay

## 2019-04-26 ENCOUNTER — Ambulatory Visit: Admission: RE | Admit: 2019-04-26 | Payer: Medicaid Other | Source: Ambulatory Visit

## 2019-04-26 ENCOUNTER — Inpatient Hospital Stay: Payer: Medicaid Other

## 2019-04-26 DIAGNOSIS — Z5111 Encounter for antineoplastic chemotherapy: Secondary | ICD-10-CM | POA: Diagnosis not present

## 2019-04-26 DIAGNOSIS — C539 Malignant neoplasm of cervix uteri, unspecified: Secondary | ICD-10-CM

## 2019-04-26 DIAGNOSIS — Z510 Encounter for antineoplastic radiation therapy: Secondary | ICD-10-CM | POA: Diagnosis not present

## 2019-04-26 DIAGNOSIS — N939 Abnormal uterine and vaginal bleeding, unspecified: Secondary | ICD-10-CM

## 2019-04-26 MED ORDER — ACETAMINOPHEN 325 MG PO TABS
ORAL_TABLET | ORAL | Status: AC
  Filled 2019-04-26: qty 2

## 2019-04-26 MED ORDER — SODIUM CHLORIDE 0.9% FLUSH
10.0000 mL | INTRAVENOUS | Status: AC | PRN
  Administered 2019-04-26: 13:00:00 10 mL
  Filled 2019-04-26: qty 10

## 2019-04-26 MED ORDER — DIPHENHYDRAMINE HCL 25 MG PO CAPS
ORAL_CAPSULE | ORAL | Status: AC
  Filled 2019-04-26: qty 2

## 2019-04-26 MED ORDER — SODIUM CHLORIDE 0.9% IV SOLUTION
250.0000 mL | Freq: Once | INTRAVENOUS | Status: AC
  Administered 2019-04-26: 250 mL via INTRAVENOUS
  Filled 2019-04-26: qty 250

## 2019-04-26 MED ORDER — HEPARIN SOD (PORK) LOCK FLUSH 100 UNIT/ML IV SOLN
250.0000 [IU] | INTRAVENOUS | Status: AC | PRN
  Filled 2019-04-26: qty 5

## 2019-04-26 MED ORDER — ACETAMINOPHEN 325 MG PO TABS
650.0000 mg | ORAL_TABLET | Freq: Once | ORAL | Status: AC
  Administered 2019-04-26: 650 mg via ORAL

## 2019-04-26 MED ORDER — DIPHENHYDRAMINE HCL 25 MG PO CAPS
25.0000 mg | ORAL_CAPSULE | Freq: Once | ORAL | Status: AC
  Administered 2019-04-26: 25 mg via ORAL

## 2019-04-26 MED ORDER — HEPARIN SOD (PORK) LOCK FLUSH 100 UNIT/ML IV SOLN
500.0000 [IU] | Freq: Every day | INTRAVENOUS | Status: AC | PRN
  Administered 2019-04-26: 500 [IU]
  Filled 2019-04-26: qty 5

## 2019-04-26 NOTE — Patient Instructions (Signed)

## 2019-04-27 ENCOUNTER — Other Ambulatory Visit: Payer: Self-pay

## 2019-04-27 ENCOUNTER — Ambulatory Visit
Admission: RE | Admit: 2019-04-27 | Discharge: 2019-04-27 | Disposition: A | Discharge status: Home / Self Care | Payer: Medicaid Other | Source: Ambulatory Visit | Attending: Radiation Oncology | Admitting: Radiation Oncology

## 2019-04-27 DIAGNOSIS — Z510 Encounter for antineoplastic radiation therapy: Secondary | ICD-10-CM | POA: Diagnosis not present

## 2019-04-27 LAB — TYPE AND SCREEN
ABO/RH(D): A NEG
Antibody Screen: NEGATIVE
Unit division: 0
Unit division: 0

## 2019-04-27 LAB — BPAM RBC
Blood Product Expiration Date: 202102282359
Blood Product Expiration Date: 202103022359
ISSUE DATE / TIME: 202102240757
ISSUE DATE / TIME: 202102240757
Unit Type and Rh: 600
Unit Type and Rh: 600

## 2019-04-28 ENCOUNTER — Other Ambulatory Visit: Payer: Self-pay

## 2019-04-28 ENCOUNTER — Encounter: Payer: Self-pay | Admitting: General Practice

## 2019-04-28 ENCOUNTER — Ambulatory Visit
Admission: RE | Admit: 2019-04-28 | Discharge: 2019-04-28 | Disposition: A | Discharge status: Home / Self Care | Payer: Medicaid Other | Source: Ambulatory Visit | Attending: Radiation Oncology | Admitting: Radiation Oncology

## 2019-04-28 ENCOUNTER — Inpatient Hospital Stay: Payer: Medicaid Other

## 2019-04-28 VITALS — BP 163/90 | HR 93 | Temp 98.7°F | Resp 16

## 2019-04-28 DIAGNOSIS — C539 Malignant neoplasm of cervix uteri, unspecified: Secondary | ICD-10-CM

## 2019-04-28 DIAGNOSIS — Z510 Encounter for antineoplastic radiation therapy: Secondary | ICD-10-CM | POA: Diagnosis not present

## 2019-04-28 DIAGNOSIS — Z5111 Encounter for antineoplastic chemotherapy: Secondary | ICD-10-CM | POA: Diagnosis not present

## 2019-04-28 MED ORDER — DEXAMETHASONE SODIUM PHOSPHATE 10 MG/ML IJ SOLN
10.0000 mg | Freq: Once | INTRAMUSCULAR | Status: AC
  Administered 2019-04-28: 10 mg via INTRAVENOUS

## 2019-04-28 MED ORDER — SODIUM CHLORIDE 0.9% FLUSH
10.0000 mL | INTRAVENOUS | Status: DC | PRN
  Administered 2019-04-28: 10 mL
  Filled 2019-04-28: qty 10

## 2019-04-28 MED ORDER — SODIUM CHLORIDE 0.9 % IV SOLN
Freq: Once | INTRAVENOUS | Status: AC
  Filled 2019-04-28: qty 250

## 2019-04-28 MED ORDER — POTASSIUM CHLORIDE 2 MEQ/ML IV SOLN
Freq: Once | INTRAVENOUS | Status: AC
  Filled 2019-04-28: qty 10

## 2019-04-28 MED ORDER — PALONOSETRON HCL INJECTION 0.25 MG/5ML
INTRAVENOUS | Status: AC
  Filled 2019-04-28: qty 5

## 2019-04-28 MED ORDER — PALONOSETRON HCL INJECTION 0.25 MG/5ML
0.2500 mg | Freq: Once | INTRAVENOUS | Status: AC
  Administered 2019-04-28: 0.25 mg via INTRAVENOUS

## 2019-04-28 MED ORDER — SODIUM CHLORIDE 0.9 % IV SOLN
30.0000 mg/m2 | Freq: Once | INTRAVENOUS | Status: AC
  Administered 2019-04-28: 60 mg via INTRAVENOUS
  Filled 2019-04-28: qty 60

## 2019-04-28 MED ORDER — DEXAMETHASONE SODIUM PHOSPHATE 10 MG/ML IJ SOLN
INTRAMUSCULAR | Status: AC
  Filled 2019-04-28: qty 1

## 2019-04-28 MED ORDER — SODIUM CHLORIDE 0.9 % IV SOLN
150.0000 mg | Freq: Once | INTRAVENOUS | Status: AC
  Administered 2019-04-28: 12:00:00 150 mg via INTRAVENOUS
  Filled 2019-04-28: qty 150

## 2019-04-28 MED ORDER — HEPARIN SOD (PORK) LOCK FLUSH 100 UNIT/ML IV SOLN
500.0000 [IU] | Freq: Once | INTRAVENOUS | Status: AC | PRN
  Administered 2019-04-28: 500 [IU]
  Filled 2019-04-28: qty 5

## 2019-04-28 NOTE — Progress Notes (Signed)
[place] Spiritual Care Note  Followed up with [facility] as planned in infusion. She was very appreciative of March program calendars and has interest in several as means for building connection/community and making meaning in a fun way (through art and cooking, especially). She also conveyed hopefulness at having started counseling with intern Art Buff. Overall, Jonette is much more relaxed and present now that the fear of the unknown related to treatment has dissipated. Plan to make another pastoral check-in at her next infusion.   [place], [place], [facility] Pager [phone] Voicemail [phone]

## 2019-04-28 NOTE — Patient Instructions (Signed)
[facility]borne Discharge Instructions for Patients Receiving Chemotherapy  Today you received the following chemotherapy agents: Cisplatin (Platinol)  To help prevent nausea and vomiting after your treatment, we encourage you to take your nausea medication as directed.   If you develop nausea and vomiting that is not controlled by your nausea medication, call the clinic.   BELOW ARE SYMPTOMS THAT SHOULD BE REPORTED IMMEDIATELY:  *FEVER GREATER THAN 100.5 F  *CHILLS WITH OR WITHOUT FEVER  NAUSEA AND VOMITING THAT IS NOT CONTROLLED WITH YOUR NAUSEA MEDICATION  *UNUSUAL SHORTNESS OF BREATH  *UNUSUAL BRUISING OR BLEEDING  TENDERNESS IN MOUTH AND THROAT WITH OR WITHOUT PRESENCE OF ULCERS  *URINARY PROBLEMS  *BOWEL PROBLEMS  UNUSUAL RASH Items with * indicate a potential emergency and should be followed up as soon as possible.  Feel free to call the clinic should you have any questions or concerns. The clinic phone number is [phone].  Please show the [facility] at check-in to the Emergency Department and triage nurse.  Coronavirus (COVID-19) Are you at risk?  Are you at risk for the Coronavirus (COVID-19)?  To be considered HIGH RISK for Coronavirus (COVID-19), you have to meet the following criteria:  . Traveled to [place], [place], [place], [place] or [place]; or in the [place] to [place], [place], [place], or [place]; and have fever, cough, and shortness of breath within the last 2 weeks of travel OR . Been in close contact with a person diagnosed with COVID-19 within the last 2 weeks and have fever, cough, and shortness of breath . IF YOU DO NOT MEET THESE CRITERIA, YOU ARE CONSIDERED LOW RISK FOR COVID-19.  What to do if you are HIGH RISK for COVID-19?  Marland Kitchen If you are having a medical emergency, call 911. . Seek medical care right away. Before you go to a doctor's office, urgent care or emergency department, call ahead and tell them  about your recent travel, contact with someone diagnosed with COVID-19, and your symptoms. You should receive instructions from your physician's office regarding next steps of care.  . When you arrive at healthcare provider, tell the healthcare staff immediately you have returned from visiting [place], [place], [place], [place] or [place]; or traveled in the [place] to [place], [place], [place], or [place]; in the last two weeks or you have been in close contact with a person diagnosed with COVID-19 in the last 2 weeks.   . Tell the health care staff about your symptoms: fever, cough and shortness of breath. . After you have been seen by a medical provider, you will be either: o Tested for (COVID-19) and discharged home on quarantine except to seek medical care if symptoms worsen, and asked to  - Stay home and avoid contact with others until you get your results (4-5 days)  - Avoid travel on public transportation if possible (such as bus, train, or airplane) or o Sent to the Emergency Department by EMS for evaluation, COVID-19 testing, and possible admission depending on your condition and test results.  What to do if you are LOW RISK for COVID-19?  Reduce your risk of any infection by using the same precautions used for avoiding the common cold or flu:  Marland Kitchen Wash your hands often with soap and warm water for at least 20 seconds.  If soap and water are not readily available, use an alcohol-based hand sanitizer with at least 60% alcohol.  . If coughing or  sneezing, cover your mouth and nose by coughing or sneezing into the elbow areas of your shirt or coat, into a tissue or into your sleeve (not your hands). . Avoid shaking hands with others and consider head nods or verbal greetings only. . Avoid touching your eyes, nose, or mouth with unwashed hands.  . Avoid close contact with people who are sick. . Avoid places or events with large numbers of people in one location, like concerts or  sporting events. . Carefully consider travel plans you have or are making. . If you are planning any travel outside or inside the [place], visit the CDC's Travelers' Health webpage for the latest health notices. . If you have some symptoms but not all symptoms, continue to monitor at home and seek medical attention if your symptoms worsen. . If you are having a medical emergency, call 911.   [place] / e-Visit: [email or web address]         [facility]: [facility]: [phone]                   [facility]: [phone]

## 2019-05-01 ENCOUNTER — Ambulatory Visit
Admission: RE | Admit: 2019-05-01 | Discharge: 2019-05-01 | Disposition: A | Discharge status: Home / Self Care | Payer: Medicaid Other | Source: Ambulatory Visit | Attending: Radiation Oncology | Admitting: Radiation Oncology

## 2019-05-01 ENCOUNTER — Inpatient Hospital Stay: Payer: Medicaid Other

## 2019-05-01 ENCOUNTER — Inpatient Hospital Stay: Payer: Medicaid Other | Attending: Hematology and Oncology

## 2019-05-01 ENCOUNTER — Other Ambulatory Visit: Payer: Self-pay

## 2019-05-01 DIAGNOSIS — C539 Malignant neoplasm of cervix uteri, unspecified: Secondary | ICD-10-CM | POA: Insufficient documentation

## 2019-05-01 DIAGNOSIS — R110 Nausea: Secondary | ICD-10-CM | POA: Insufficient documentation

## 2019-05-01 DIAGNOSIS — Z79899 Other long term (current) drug therapy: Secondary | ICD-10-CM | POA: Diagnosis not present

## 2019-05-01 DIAGNOSIS — R197 Diarrhea, unspecified: Secondary | ICD-10-CM | POA: Diagnosis not present

## 2019-05-01 DIAGNOSIS — E860 Dehydration: Secondary | ICD-10-CM | POA: Insufficient documentation

## 2019-05-01 DIAGNOSIS — I129 Hypertensive chronic kidney disease with stage 1 through stage 4 chronic kidney disease, or unspecified chronic kidney disease: Secondary | ICD-10-CM | POA: Insufficient documentation

## 2019-05-01 DIAGNOSIS — Z5111 Encounter for antineoplastic chemotherapy: Secondary | ICD-10-CM | POA: Insufficient documentation

## 2019-05-01 DIAGNOSIS — T451X5A Adverse effect of antineoplastic and immunosuppressive drugs, initial encounter: Secondary | ICD-10-CM | POA: Insufficient documentation

## 2019-05-01 DIAGNOSIS — Z510 Encounter for antineoplastic radiation therapy: Secondary | ICD-10-CM | POA: Diagnosis not present

## 2019-05-01 DIAGNOSIS — N183 Chronic kidney disease, stage 3 unspecified: Secondary | ICD-10-CM | POA: Diagnosis not present

## 2019-05-01 DIAGNOSIS — G620 Drug-induced polyneuropathy: Secondary | ICD-10-CM | POA: Diagnosis not present

## 2019-05-01 LAB — COMPREHENSIVE METABOLIC PANEL
ALT: 20 U/L (ref 0–44)
AST: 12 U/L — ABNORMAL LOW (ref 15–41)
Albumin: 3.4 g/dL — ABNORMAL LOW (ref 3.5–5.0)
Alkaline Phosphatase: 82 U/L (ref 38–126)
Anion gap: 8 (ref 5–15)
BUN: 27 mg/dL — ABNORMAL HIGH (ref 6–20)
CO2: 26 mmol/L (ref 22–32)
Calcium: 8.5 mg/dL — ABNORMAL LOW (ref 8.9–10.3)
Chloride: 103 mmol/L (ref 98–111)
Creatinine, Ser: 1.24 mg/dL — ABNORMAL HIGH (ref 0.44–1.00)
GFR calc Af Amer: >60 mL/min (ref >60)
GFR calc non Af Amer: 54 mL/min — ABNORMAL LOW (ref >60)
Glucose, Bld: 117 mg/dL — ABNORMAL HIGH (ref 70–99)
Potassium: 4.4 mmol/L (ref 3.5–5.1)
Sodium: 137 mmol/L (ref 135–145)
Total Bilirubin: 0.5 mg/dL (ref 0.3–1.2)
Total Protein: 6.6 g/dL (ref 6.5–8.1)

## 2019-05-01 LAB — CBC WITH DIFFERENTIAL/PLATELET
Abs Immature Granulocytes: 0.03 10*3/uL (ref 0.00–0.07)
Basophils Absolute: 0 10*3/uL (ref 0.0–0.1)
Basophils Relative: 0 %
Eosinophils Absolute: 0.2 10*3/uL (ref 0.0–0.5)
Eosinophils Relative: 4 %
HCT: 28.7 % — ABNORMAL LOW (ref 36.0–46.0)
Hemoglobin: 9.5 g/dL — ABNORMAL LOW (ref 12.0–15.0)
Immature Granulocytes: 1 %
Lymphocytes Relative: 4 %
Lymphs Abs: 0.2 10*3/uL — ABNORMAL LOW (ref 0.7–4.0)
MCH: 27.8 pg (ref 26.0–34.0)
MCHC: 33.1 g/dL (ref 30.0–36.0)
MCV: 83.9 fL (ref 80.0–100.0)
Monocytes Absolute: 0.3 10*3/uL (ref 0.1–1.0)
Monocytes Relative: 6 %
Neutro Abs: 4.2 10*3/uL (ref 1.7–7.7)
Neutrophils Relative %: 85 %
Platelets: 177 10*3/uL (ref 150–400)
RBC: 3.42 MIL/uL — ABNORMAL LOW (ref 3.87–5.11)
RDW: 22.5 % — ABNORMAL HIGH (ref 11.5–15.5)
WBC: 5 10*3/uL (ref 4.0–10.5)
nRBC: 0 % (ref 0.0–0.2)

## 2019-05-01 LAB — SAMPLE TO BLOOD BANK

## 2019-05-01 LAB — MAGNESIUM: Magnesium: 1.7 mg/dL (ref 1.7–2.4)

## 2019-05-01 MED ORDER — HEPARIN SOD (PORK) LOCK FLUSH 100 UNIT/ML IV SOLN
500.0000 [IU] | Freq: Once | INTRAVENOUS | Status: AC
  Administered 2019-05-01: 500 [IU]
  Filled 2019-05-01: qty 5

## 2019-05-01 MED ORDER — SODIUM CHLORIDE 0.9% FLUSH
10.0000 mL | Freq: Once | INTRAVENOUS | Status: AC
  Administered 2019-05-01: 10 mL
  Filled 2019-05-01: qty 10

## 2019-05-01 NOTE — Progress Notes (Signed)
Spoke to pt on 05/01/19  for a telehealth counseling session via phone. Counseling intern provided space for pt to open up about experiences and responded with empathy, compassion, and normalization of feelings. Pt reported having a "good week" for the most part as evidenced by her lower level of anxiety. Pt expressed excitement about signing up for 4 or 5 [facility] support programs and groups and anticipates it will be helpful for her to connect with others going through a similar experience and to have creative projects to provide distraction. Pt stated she is nervous about the change happening next week to her radiation treatment, but she meets with the doctor tomorrow to get more information about what to expect and suspects she will feel more at ease once she has more information. Pt and CI discussed how pt has exhibited many times how she learns quickly from experiences and quickly adapts when she has new information to consider. CI suggested the pt can remind herself how she generally feels better once she has more information or once she has experienced something once. Pt reported it is also helpful for her to have a regular routine and feel prepared for each day. Her daily routine includes affirmations every morning, which she reported help her set the tone for the day. Pt reported a history of panic attacks and CI provided information about using breath-work to calm the nervous system. CI talked through general deep breathing and square breathing techniques. Pt stated breathing techniques could be helpful for her when she notices she is feeling anxious. Pt welcomed another call from the CI next week.  Next Counseling Session: Monday, May 08, 2019 at noon via phone  [place] Counseling Intern Voicemail:  [phone]

## 2019-05-01 NOTE — Progress Notes (Signed)
Patient advised not to take off blue bractlet

## 2019-05-02 ENCOUNTER — Encounter (HOSPITAL_BASED_OUTPATIENT_CLINIC_OR_DEPARTMENT_OTHER): Payer: Self-pay | Admitting: Radiation Oncology

## 2019-05-02 ENCOUNTER — Ambulatory Visit
Admission: RE | Admit: 2019-05-02 | Discharge: 2019-05-02 | Disposition: A | Discharge status: Home / Self Care | Payer: Medicaid Other | Source: Ambulatory Visit | Attending: Radiation Oncology | Admitting: Radiation Oncology

## 2019-05-02 ENCOUNTER — Other Ambulatory Visit: Payer: Self-pay

## 2019-05-02 ENCOUNTER — Inpatient Hospital Stay (HOSPITAL_BASED_OUTPATIENT_CLINIC_OR_DEPARTMENT_OTHER): Payer: Medicaid Other | Admitting: Hematology and Oncology

## 2019-05-02 DIAGNOSIS — Z5111 Encounter for antineoplastic chemotherapy: Secondary | ICD-10-CM | POA: Diagnosis not present

## 2019-05-02 DIAGNOSIS — R300 Dysuria: Secondary | ICD-10-CM

## 2019-05-02 DIAGNOSIS — C539 Malignant neoplasm of cervix uteri, unspecified: Secondary | ICD-10-CM | POA: Diagnosis not present

## 2019-05-02 DIAGNOSIS — R110 Nausea: Secondary | ICD-10-CM | POA: Diagnosis not present

## 2019-05-02 DIAGNOSIS — N183 Chronic kidney disease, stage 3 unspecified: Secondary | ICD-10-CM | POA: Diagnosis not present

## 2019-05-02 DIAGNOSIS — Z510 Encounter for antineoplastic radiation therapy: Secondary | ICD-10-CM | POA: Diagnosis not present

## 2019-05-02 DIAGNOSIS — N939 Abnormal uterine and vaginal bleeding, unspecified: Secondary | ICD-10-CM

## 2019-05-02 DIAGNOSIS — T451X5A Adverse effect of antineoplastic and immunosuppressive drugs, initial encounter: Secondary | ICD-10-CM

## 2019-05-02 DIAGNOSIS — G620 Drug-induced polyneuropathy: Secondary | ICD-10-CM

## 2019-05-02 LAB — URINALYSIS, COMPLETE (UACMP) WITH MICROSCOPIC
Bilirubin Urine: NEGATIVE
Glucose, UA: NEGATIVE mg/dL
Ketones, ur: NEGATIVE mg/dL
Leukocytes,Ua: NEGATIVE
Nitrite: NEGATIVE
Protein, ur: >=300 mg/dL — AB
Specific Gravity, Urine: 1.018 (ref 1.005–1.030)
pH: 5 (ref 5.0–8.0)

## 2019-05-02 NOTE — Progress Notes (Signed)
Tamara Osborne from [place] cross office called 2nd ekg done 04-23-2019 and was sent to be scanned in epic

## 2019-05-02 NOTE — Progress Notes (Addendum)
Spoke w/ via phone for pre-op interview---[facility] Lab needs dos----  Urine poct            Lab results------cbc, cmet 05-01-2019 COVID test ------05-05-2019 Arrive at -------530 am 05-09-2019 NPO after ------midnight Medications to take morning of surgery -----clonidine prn, lorazepam prn Diabetic medication -----n/a Patient Special Instructions ----- Pre-Op special Istructions ----- Patient verbalized understanding of instructions that were given at this phone interview. Patient denies shortness of breath, chest pain, fever, cough a this phone interview.  Anesthesia : abnormal ekg 03-23-2019, hemaglobin 9.5 on 05-01-2019 lab, osa Addendum: spoke with Janett Billow zanetto repeat ekg day of surgery if 2nd ekg not received, ekg order placed in epic,   [id number] medical Cardiologist :none Chest x-ray :none EKG :Mar 23, 2019 epic, 03-15-2019 epic Echo :none Cardiac Cath : none Sleep Study/ CPAP :3 yrs ago, severe osa no cpap used, insurance would not cover cpap Fasting Blood Sugar :      / Checks Blood Sugar -- times a day:  n/a Blood Thinner/ Instructions /Last Dose:n/a ASA / Instructions/ Last Dose : n/a  Patient denies shortness of breath, chest pain, fever, and cough at this phone interview.

## 2019-05-03 ENCOUNTER — Other Ambulatory Visit: Payer: Self-pay

## 2019-05-03 ENCOUNTER — Ambulatory Visit
Admission: RE | Admit: 2019-05-03 | Discharge: 2019-05-03 | Disposition: A | Discharge status: Home / Self Care | Payer: Medicaid Other | Source: Ambulatory Visit | Attending: Radiation Oncology | Admitting: Radiation Oncology

## 2019-05-03 ENCOUNTER — Telehealth: Payer: Self-pay | Admitting: Hematology and Oncology

## 2019-05-03 ENCOUNTER — Ambulatory Visit: Payer: Medicaid Other

## 2019-05-03 ENCOUNTER — Encounter: Payer: Self-pay | Admitting: Hematology and Oncology

## 2019-05-03 DIAGNOSIS — Z510 Encounter for antineoplastic radiation therapy: Secondary | ICD-10-CM | POA: Diagnosis not present

## 2019-05-03 LAB — URINE CULTURE: Culture: <10000 — AB

## 2019-05-03 NOTE — Assessment & Plan Note (Signed)
Clinically, she appears to be responding to treatment She have minimal vaginal bleeding now Overall, she tolerated treatment fairly well except for intermittent diarrhea, nausea, very mild neuropathy and  anemia I will continue to see her on a weekly basis for follow-up and supportive care The plan will be to administer 5-6 doses of cisplatin I will see her next week to decide whether we should give her the sixth dose or stop after fifth dose She understand the plan of care

## 2019-05-03 NOTE — Assessment & Plan Note (Signed)
After recent blood transfusion, her anemia has improved We will continue to observe on a weekly basis She continues to have mild vaginal bleeding

## 2019-05-03 NOTE — Assessment & Plan Note (Signed)
she has mild peripheral neuropathy, likely related to side effects of treatment. It is only mild, not bothering the patient. I will observe for now If it gets worse in the future, I will consider modifying the dose of the treatment  

## 2019-05-03 NOTE — Progress Notes (Signed)
[facility] OFFICE PROGRESS NOTE  Patient Care Team: [place], [place] as PCP - General (Family Medicine)  ASSESSMENT & PLAN:  Squamous cell carcinoma of cervix ([place]) Clinically, she appears to be responding to treatment She have minimal vaginal bleeding now Overall, she tolerated treatment fairly well except for intermittent diarrhea, nausea, very mild neuropathy and  anemia I will continue to see her on a weekly basis for follow-up and supportive care The plan will be to administer 5-6 doses of cisplatin I will see her next week to decide whether we should give her the sixth dose or stop after fifth dose She understand the plan of care  Chemotherapy-induced nausea I encouraged her to take antiemetics as tolerated  Peripheral neuropathy due to chemotherapy [facility]) she has mild peripheral neuropathy, likely related to side effects of treatment. It is only mild, not bothering the patient. I will observe for now If it gets worse in the future, I will consider modifying the dose of the treatment   CKD (chronic kidney disease), symptom management only, stage 3 (moderate) Renal function continues to improve We will observe carefully The patient will continue aggressive hydration as tolerated  Abnormal vaginal bleeding After recent blood transfusion, her anemia has improved We will continue to observe on a weekly basis She continues to have mild vaginal bleeding   No orders of the defined types were placed in this encounter.   All questions were answered. The patient knows to call the clinic with any problems, questions or concerns. The total time spent in the appointment was 20 minutes encounter with patients including review of chart and various tests results, discussions about plan of care and coordination of care plan   [name], MD 05/03/2019 8:35 AM  INTERVAL HISTORY: Please see below for problem oriented charting. She returns for her weekly  follow-up She tolerated recent treatment well except for mild nausea, loose stool and persistent mild tingling sensation/neuropathy She continues to have vaginal bleeding but less than last week Denies recent dizziness  SUMMARY OF ONCOLOGIC HISTORY: Oncology History  Squamous cell carcinoma of cervix ([place])  03/21/2019 Pathology Results   The biopsy is superficial and therefore depth of invasion cannot be  determined.  There is at least carcinoma in-situ.    03/23/2019 Initial Diagnosis   Squamous cell carcinoma of cervix ([place])   03/23/2019 Pathology Results   CERVIX, 11 O CLOCK, BIOPSY:  -  Squamous cell carcinoma, invasive  -  See comment   03/28/2019 PET scan   1. Hypermetabolic cervical mass measures roughly 7.6 by 6.9 by 4.9 cm, compatible with malignancy. No adenopathy or distant metastatic spread identified. 2. Extending cephalad and anterior to the uterine fundus, there is a 450 cubic cm simple fluid density structure without hypermetabolic activity. This may represent a right adnexal cyst (favored), peritoneal inclusion cyst, or (if the patient has a remote history of pancreatitis) a remote pseudocyst. 3. Faint ground density nodularity in both lower lobes which could be from atypical infection or extrinsic allergic alveolitis. Given nodular appearance of some of this ground-glass density, I would recommend follow up chest CT in 3 months time in order to ensure clearance. 4. Moderate cardiomegaly, cause uncertain. The patient also has advanced for age/gender atherosclerosis. Aortic Atherosclerosis (ICD10-I70.0).     04/04/2019 Procedure   Successful placement of a right internal jugular approach power injectable Port-A-Cath. The catheter is ready for immediate use   04/07/2019 -  Chemotherapy   The patient had weekly  cisplatin for chemotherapy treatment.       REVIEW OF SYSTEMS:   Constitutional: Denies fevers, chills or abnormal weight loss Eyes: Denies blurriness of  vision Ears, nose, mouth, throat, and face: Denies mucositis or sore throat Respiratory: Denies cough, dyspnea or wheezes Cardiovascular: Denies palpitation, chest discomfort or lower extremity swelling Skin: Denies abnormal skin rashes Lymphatics: Denies new lymphadenopathy or easy bruising Behavioral/Psych: Mood is stable, no new changes  All other systems were reviewed with the patient and are negative.  I have reviewed the past medical history, past surgical history, social history and family history with the patient and they are unchanged from previous note.  ALLERGIES:  is allergic to penicillins; shellfish allergy; and sulfa antibiotics.  MEDICATIONS:  Current Outpatient Medications  Medication Sig Dispense Refill  . amLODipine (NORVASC) 10 MG tablet Take 1 tablet (10 mg total) by mouth daily. (Patient taking differently: Take 10 mg by mouth every evening. ) 90 tablet 0  . cloNIDine (CATAPRES) 0.1 MG tablet Take 1 tablet (0.1 mg total) by mouth 2 (two) times daily as needed (blood pressure >180/110). 50 tablet 0  . diphenhydramine-acetaminophen (TYLENOL PM) 25-500 MG TABS tablet Take 1 tablet by mouth at bedtime as needed.    . fluticasone (FLONASE) 50 MCG/ACT nasal spray Place 1 spray into both nostrils daily as needed for allergies or rhinitis.    . hydrOXYzine (ATARAX/VISTARIL) 25 MG tablet Take 1 tablet (25 mg total) by mouth every 6 (six) hours as needed for anxiety. 20 tablet 0  . lidocaine-prilocaine (EMLA) cream Apply to affected area once 30 g 3  . loperamide (IMODIUM) 2 MG capsule Take by mouth as needed for diarrhea or loose stools.    Marland Kitchen LORazepam (ATIVAN) 0.5 MG tablet Take 1 tablet (0.5 mg total) by mouth every 8 (eight) hours as needed for anxiety. 30 tablet 0  . magnesium oxide (MAG-OX) 400 (241.3 Mg) MG tablet Take 1 tablet (400 mg total) by mouth 2 (two) times daily. (Patient taking differently: Take 400 mg by mouth daily. ) 60 tablet 1  . ondansetron (ZOFRAN) 8 MG  tablet Take 1 tablet (8 mg total) by mouth every 8 (eight) hours as needed. 30 tablet 1  . prochlorperazine (COMPAZINE) 10 MG tablet Take 1 tablet (10 mg total) by mouth every 6 (six) hours as needed (Nausea or vomiting). 30 tablet 1  . senna-docusate (SENOKOT-S) 8.6-50 MG tablet Take 2 tablets by mouth at bedtime. To prevent constipation, do not take if having loose stools (Patient not taking: Reported on 05/02/2019) 60 tablet 1   No current facility-administered medications for this visit.    PHYSICAL EXAMINATION: ECOG PERFORMANCE STATUS: 1 - Symptomatic but completely ambulatory  Vitals:   05/02/19 1217  BP: 137/87  Pulse: 95  Resp: 18  Temp: 98.5 F (36.9 C)  SpO2: 100%   Filed Weights   05/02/19 1217  Weight: 200 lb 6.4 oz (90.9 kg)    GENERAL:alert, no distress and comfortable SKIN: skin color, texture, turgor are normal, no rashes or significant lesions EYES: normal, Conjunctiva are pink and non-injected, sclera clear OROPHARYNX:no exudate, no erythema and lips, buccal mucosa, and tongue normal  NECK: supple, thyroid normal size, non-tender, without nodularity LYMPH:  no palpable lymphadenopathy in the cervical, axillary or inguinal LUNGS: clear to auscultation and percussion with normal breathing effort HEART: regular rate & rhythm and no murmurs and no lower extremity edema ABDOMEN:abdomen soft, non-tender and normal bowel sounds Musculoskeletal:no cyanosis of digits and no clubbing  NEURO: alert & oriented x 3 with fluent speech, no focal motor/sensory deficits  LABORATORY DATA:  I have reviewed the data as listed    Component Value Date/Time   NA 137 05/01/2019 1149   K 4.4 05/01/2019 1149   CL 103 05/01/2019 1149   CO2 26 05/01/2019 1149   GLUCOSE 117 (H) 05/01/2019 1149   BUN 27 (H) 05/01/2019 1149   CREATININE 1.24 (H) 05/01/2019 1149   CALCIUM 8.5 (L) 05/01/2019 1149   PROT 6.6 05/01/2019 1149   ALBUMIN 3.4 (L) 05/01/2019 1149   AST 12 (L) 05/01/2019  1149   ALT 20 05/01/2019 1149   ALKPHOS 82 05/01/2019 1149   BILITOT 0.5 05/01/2019 1149   GFRNONAA 54 (L) 05/01/2019 1149   GFRAA >60 05/01/2019 1149    No results found for: SPEP, UPEP  Lab Results  Component Value Date   WBC 5.0 05/01/2019   NEUTROABS 4.2 05/01/2019   HGB 9.5 (L) 05/01/2019   HCT 28.7 (L) 05/01/2019   MCV 83.9 05/01/2019   PLT 177 05/01/2019      Chemistry      Component Value Date/Time   NA 137 05/01/2019 1149   K 4.4 05/01/2019 1149   CL 103 05/01/2019 1149   CO2 26 05/01/2019 1149   BUN 27 (H) 05/01/2019 1149   CREATININE 1.24 (H) 05/01/2019 1149      Component Value Date/Time   CALCIUM 8.5 (L) 05/01/2019 1149   ALKPHOS 82 05/01/2019 1149   AST 12 (L) 05/01/2019 1149   ALT 20 05/01/2019 1149   BILITOT 0.5 05/01/2019 1149       RADIOGRAPHIC STUDIES: I have personally reviewed the radiological images as listed and agreed with the findings in the report. IR IMAGING GUIDED PORT INSERTION  Result Date: 04/04/2019 INDICATION: History of cervical cancer. In need of durable intravenous access for chemotherapy administration. EXAM: IMPLANTED PORT A CATH PLACEMENT WITH ULTRASOUND AND FLUOROSCOPIC GUIDANCE COMPARISON:  PET-CT-03/28/2019 MEDICATIONS: Clindamycin 900 mg IV; The antibiotic was administered within an appropriate time interval prior to skin puncture. ANESTHESIA/SEDATION: Moderate (conscious) sedation was employed during this procedure. A total of Versed 4 mg and Fentanyl 100 mcg was administered intravenously. Moderate Sedation Time: 23 minutes. The patient's level of consciousness and vital signs were monitored continuously by radiology nursing throughout the procedure under my direct supervision. CONTRAST:  None FLUOROSCOPY TIME:  36 seconds (3 mGy) COMPLICATIONS: None immediate. PROCEDURE: The procedure, risks, benefits, and alternatives were explained to the patient. Questions regarding the procedure were encouraged and answered. The patient  understands and consents to the procedure. The right neck and chest were prepped with chlorhexidine in a sterile fashion, and a sterile drape was applied covering the operative field. Maximum barrier sterile technique with sterile gowns and gloves were used for the procedure. A timeout was performed prior to the initiation of the procedure. Local anesthesia was provided with 1% lidocaine with epinephrine. After creating a small venotomy incision, a micropuncture kit was utilized to access the internal jugular vein. Real-time ultrasound guidance was utilized for vascular access including the acquisition of a permanent ultrasound image documenting patency of the accessed vessel. The microwire was utilized to measure appropriate catheter length. A subcutaneous port pocket was then created along the upper chest wall utilizing a combination of sharp and blunt dissection. The pocket was irrigated with sterile saline. A single lumen "ISP" sized power injectable port was chosen for placement. The 8 Fr catheter was tunneled from the port pocket site to  the venotomy incision. The port was placed in the pocket. The external catheter was trimmed to appropriate length. At the venotomy, an 8 Fr peel-away sheath was placed over a guidewire under fluoroscopic guidance. The catheter was then placed through the sheath and the sheath was removed. Final catheter positioning was confirmed and documented with a fluoroscopic spot radiograph. The port was accessed with a Huber needle, aspirated and flushed with heparinized saline. The venotomy site was closed with an interrupted 4-0 Vicryl suture. The port pocket incision was closed with interrupted 2-0 Vicryl suture and the skin was opposed with a running subcuticular 4-0 Vicryl suture. Dermabond and Steri-strips were applied to both incisions. Dressings were placed. The patient tolerated the procedure well without immediate post procedural complication. FINDINGS: After catheter  placement, the tip lies within the superior cavoatrial junction. The catheter aspirates and flushes normally and is ready for immediate use. IMPRESSION: Successful placement of a right internal jugular approach power injectable Port-A-Cath. The catheter is ready for immediate use. Electronically Signed   By: Sandi Mariscal M.D.   On: 04/04/2019 12:09

## 2019-05-03 NOTE — Assessment & Plan Note (Signed)
Renal function continues to improve We will observe carefully The patient will continue aggressive hydration as tolerated

## 2019-05-03 NOTE — Telephone Encounter (Signed)
Scheduled appts per 3/3 sch msg. Pt is aware of new appt date and time.

## 2019-05-03 NOTE — Assessment & Plan Note (Signed)
I encouraged her to take antiemetics as tolerated

## 2019-05-04 ENCOUNTER — Encounter: Payer: Self-pay | Admitting: Radiation Oncology

## 2019-05-04 ENCOUNTER — Other Ambulatory Visit: Payer: Self-pay

## 2019-05-04 ENCOUNTER — Ambulatory Visit
Admission: RE | Admit: 2019-05-04 | Discharge: 2019-05-04 | Disposition: A | Discharge status: Home / Self Care | Payer: Medicaid Other | Source: Ambulatory Visit | Attending: Radiation Oncology | Admitting: Radiation Oncology

## 2019-05-04 DIAGNOSIS — Z510 Encounter for antineoplastic radiation therapy: Secondary | ICD-10-CM | POA: Diagnosis not present

## 2019-05-05 ENCOUNTER — Inpatient Hospital Stay: Payer: Medicaid Other

## 2019-05-05 ENCOUNTER — Other Ambulatory Visit (HOSPITAL_COMMUNITY)
Admission: RE | Admit: 2019-05-05 | Discharge: 2019-05-05 | Disposition: A | Discharge status: Home / Self Care | Payer: Medicaid Other | Source: Ambulatory Visit | Attending: Radiation Oncology | Admitting: Radiation Oncology

## 2019-05-05 ENCOUNTER — Other Ambulatory Visit: Payer: Self-pay

## 2019-05-05 ENCOUNTER — Encounter: Payer: Self-pay | Admitting: General Practice

## 2019-05-05 VITALS — BP 159/88 | HR 98 | Temp 98.5°F | Resp 16

## 2019-05-05 DIAGNOSIS — Z20822 Contact with and (suspected) exposure to covid-19: Secondary | ICD-10-CM | POA: Insufficient documentation

## 2019-05-05 DIAGNOSIS — Z01812 Encounter for preprocedural laboratory examination: Secondary | ICD-10-CM | POA: Diagnosis present

## 2019-05-05 DIAGNOSIS — Z5111 Encounter for antineoplastic chemotherapy: Secondary | ICD-10-CM | POA: Diagnosis not present

## 2019-05-05 DIAGNOSIS — C539 Malignant neoplasm of cervix uteri, unspecified: Secondary | ICD-10-CM

## 2019-05-05 LAB — SARS CORONAVIRUS 2 (TAT 6-24 HRS): SARS Coronavirus 2: NEGATIVE

## 2019-05-05 MED ORDER — POTASSIUM CHLORIDE 2 MEQ/ML IV SOLN
Freq: Once | INTRAVENOUS | Status: AC
  Filled 2019-05-05: qty 10

## 2019-05-05 MED ORDER — SODIUM CHLORIDE 0.9 % IV SOLN
150.0000 mg | Freq: Once | INTRAVENOUS | Status: AC
  Administered 2019-05-05: 150 mg via INTRAVENOUS
  Filled 2019-05-05: qty 150

## 2019-05-05 MED ORDER — SODIUM CHLORIDE 0.9% FLUSH
10.0000 mL | INTRAVENOUS | Status: DC | PRN
  Administered 2019-05-05: 10 mL
  Filled 2019-05-05: qty 10

## 2019-05-05 MED ORDER — SODIUM CHLORIDE 0.9 % IV SOLN
Freq: Once | INTRAVENOUS | Status: AC
  Filled 2019-05-05: qty 250

## 2019-05-05 MED ORDER — PALONOSETRON HCL INJECTION 0.25 MG/5ML
INTRAVENOUS | Status: AC
  Filled 2019-05-05: qty 5

## 2019-05-05 MED ORDER — DEXAMETHASONE SODIUM PHOSPHATE 10 MG/ML IJ SOLN
INTRAMUSCULAR | Status: AC
  Filled 2019-05-05: qty 1

## 2019-05-05 MED ORDER — PALONOSETRON HCL INJECTION 0.25 MG/5ML
0.2500 mg | Freq: Once | INTRAVENOUS | Status: AC
  Administered 2019-05-05: 0.25 mg via INTRAVENOUS

## 2019-05-05 MED ORDER — DEXAMETHASONE SODIUM PHOSPHATE 10 MG/ML IJ SOLN
10.0000 mg | Freq: Once | INTRAMUSCULAR | Status: AC
  Administered 2019-05-05: 10 mg via INTRAVENOUS

## 2019-05-05 MED ORDER — HEPARIN SOD (PORK) LOCK FLUSH 100 UNIT/ML IV SOLN
500.0000 [IU] | Freq: Once | INTRAVENOUS | Status: AC | PRN
  Administered 2019-05-05: 500 [IU]
  Filled 2019-05-05: qty 5

## 2019-05-05 MED ORDER — SODIUM CHLORIDE 0.9 % IV SOLN
30.0000 mg/m2 | Freq: Once | INTRAVENOUS | Status: AC
  Administered 2019-05-05: 60 mg via INTRAVENOUS
  Filled 2019-05-05: qty 60

## 2019-05-05 NOTE — Progress Notes (Signed)
[place] Spiritual Care Note  Followed up with Jeanett Schlein in infusion as planned for social and emotional support. She values having a conversation partner, especially during the isolation of covid. Her dog Cocoa is a significant source of meaning, coping, and social support.  Overall, Tamara Osborne reports and reflects significantly reduced anxiety now that she is familiar with her treatment routine, is adjusting to her diagnosis, and has additional supports in place. Samyra reports appreciation of counseling sessions with Art Buff, and she has registered for several [facility] programs, including the retreat that Anda Kraft and I are leading later in the month.   [place], [place], [facility] Pager [phone] Voicemail [phone]

## 2019-05-05 NOTE — Patient Instructions (Signed)
[facility] Discharge Instructions for Patients Receiving Chemotherapy  Today you received the following chemotherapy agents: Cisplatin (Platinol)  To help prevent nausea and vomiting after your treatment, we encourage you to take your nausea medication as directed.   If you develop nausea and vomiting that is not controlled by your nausea medication, call the clinic.   BELOW ARE SYMPTOMS THAT SHOULD BE REPORTED IMMEDIATELY:  *FEVER GREATER THAN 100.5 F  *CHILLS WITH OR WITHOUT FEVER  NAUSEA AND VOMITING THAT IS NOT CONTROLLED WITH YOUR NAUSEA MEDICATION  *UNUSUAL SHORTNESS OF BREATH  *UNUSUAL BRUISING OR BLEEDING  TENDERNESS IN MOUTH AND THROAT WITH OR WITHOUT PRESENCE OF ULCERS  *URINARY PROBLEMS  *BOWEL PROBLEMS  UNUSUAL RASH Items with * indicate a potential emergency and should be followed up as soon as possible.  Feel free to call the clinic should you have any questions or concerns. The clinic phone number is [phone].  Please show the [facility] at check-in to the Emergency Department and triage nurse.  Coronavirus (COVID-19) Are you at risk?  Are you at risk for the Coronavirus (COVID-19)?  To be considered HIGH RISK for Coronavirus (COVID-19), you have to meet the following criteria:  . Traveled to [place], [place], [place], [place] or [place]; or in the [place] to [place], [place], [place], or [place]; and have fever, cough, and shortness of breath within the last 2 weeks of travel OR . Been in close contact with a person diagnosed with COVID-19 within the last 2 weeks and have fever, cough, and shortness of breath . IF YOU DO NOT MEET THESE CRITERIA, YOU ARE CONSIDERED LOW RISK FOR COVID-19.  What to do if you are HIGH RISK for COVID-19?  Marland Kitchen If you are having a medical emergency, call 911. . Seek medical care right away. Before you go to a doctor's office, urgent care or emergency department, call ahead and tell them  about your recent travel, contact with someone diagnosed with COVID-19, and your symptoms. You should receive instructions from your physician's office regarding next steps of care.  . When you arrive at healthcare provider, tell the healthcare staff immediately you have returned from visiting [place], [place], [place], [place] or [place]; or traveled in the [place] to [place], [place], [place], or [place]; in the last two weeks or you have been in close contact with a person diagnosed with COVID-19 in the last 2 weeks.   . Tell the health care staff about your symptoms: fever, cough and shortness of breath. . After you have been seen by a medical provider, you will be either: o Tested for (COVID-19) and discharged home on quarantine except to seek medical care if symptoms worsen, and asked to  - Stay home and avoid contact with others until you get your results (4-5 days)  - Avoid travel on public transportation if possible (such as bus, train, or airplane) or o Sent to the Emergency Department by EMS for evaluation, COVID-19 testing, and possible admission depending on your condition and test results.  What to do if you are LOW RISK for COVID-19?  Reduce your risk of any infection by using the same precautions used for avoiding the common cold or flu:  Marland Kitchen Wash your hands often with soap and warm water for at least 20 seconds.  If soap and water are not readily available, use an alcohol-based hand sanitizer with at least 60% alcohol.  . If coughing or  sneezing, cover your mouth and nose by coughing or sneezing into the elbow areas of your shirt or coat, into a tissue or into your sleeve (not your hands). . Avoid shaking hands with others and consider head nods or verbal greetings only. . Avoid touching your eyes, nose, or mouth with unwashed hands.  . Avoid close contact with people who are sick. . Avoid places or events with large numbers of people in one location, like concerts or  sporting events. . Carefully consider travel plans you have or are making. . If you are planning any travel outside or inside the [place], visit the CDC's Travelers' Health webpage for the latest health notices. . If you have some symptoms but not all symptoms, continue to monitor at home and seek medical attention if your symptoms worsen. . If you are having a medical emergency, call 911.   [place] / e-Visit: [email or web address]         [facility]: [facility]: [phone]                   [facility]: [phone]

## 2019-05-08 ENCOUNTER — Ambulatory Visit: Payer: Medicaid Other | Admitting: Radiation Oncology

## 2019-05-08 ENCOUNTER — Other Ambulatory Visit (HOSPITAL_COMMUNITY): Payer: Self-pay

## 2019-05-08 ENCOUNTER — Inpatient Hospital Stay (HOSPITAL_BASED_OUTPATIENT_CLINIC_OR_DEPARTMENT_OTHER): Payer: Medicaid Other | Admitting: Hematology and Oncology

## 2019-05-08 ENCOUNTER — Encounter (HOSPITAL_BASED_OUTPATIENT_CLINIC_OR_DEPARTMENT_OTHER): Payer: Self-pay

## 2019-05-08 ENCOUNTER — Encounter: Payer: Self-pay | Admitting: Oncology

## 2019-05-08 ENCOUNTER — Encounter: Payer: Self-pay | Admitting: Hematology and Oncology

## 2019-05-08 ENCOUNTER — Inpatient Hospital Stay: Payer: Medicaid Other

## 2019-05-08 ENCOUNTER — Ambulatory Visit (HOSPITAL_BASED_OUTPATIENT_CLINIC_OR_DEPARTMENT_OTHER): Admit: 2019-05-08 | Payer: Self-pay | Admitting: Radiation Oncology

## 2019-05-08 ENCOUNTER — Other Ambulatory Visit: Payer: Self-pay

## 2019-05-08 DIAGNOSIS — N183 Chronic kidney disease, stage 3 unspecified: Secondary | ICD-10-CM

## 2019-05-08 DIAGNOSIS — T451X5A Adverse effect of antineoplastic and immunosuppressive drugs, initial encounter: Secondary | ICD-10-CM | POA: Diagnosis not present

## 2019-05-08 DIAGNOSIS — G620 Drug-induced polyneuropathy: Secondary | ICD-10-CM

## 2019-05-08 DIAGNOSIS — C539 Malignant neoplasm of cervix uteri, unspecified: Secondary | ICD-10-CM

## 2019-05-08 DIAGNOSIS — Z5111 Encounter for antineoplastic chemotherapy: Secondary | ICD-10-CM | POA: Diagnosis not present

## 2019-05-08 LAB — COMPREHENSIVE METABOLIC PANEL
ALT: 29 U/L (ref 0–44)
AST: 15 U/L (ref 15–41)
Albumin: 3.6 g/dL (ref 3.5–5.0)
Alkaline Phosphatase: 92 U/L (ref 38–126)
Anion gap: 9 (ref 5–15)
BUN: 29 mg/dL — ABNORMAL HIGH (ref 6–20)
CO2: 26 mmol/L (ref 22–32)
Calcium: 8.7 mg/dL — ABNORMAL LOW (ref 8.9–10.3)
Chloride: 104 mmol/L (ref 98–111)
Creatinine, Ser: 1.38 mg/dL — ABNORMAL HIGH (ref 0.44–1.00)
GFR calc Af Amer: 55 mL/min — ABNORMAL LOW (ref >60)
GFR calc non Af Amer: 47 mL/min — ABNORMAL LOW (ref >60)
Glucose, Bld: 110 mg/dL — ABNORMAL HIGH (ref 70–99)
Potassium: 4.2 mmol/L (ref 3.5–5.1)
Sodium: 139 mmol/L (ref 135–145)
Total Bilirubin: 0.5 mg/dL (ref 0.3–1.2)
Total Protein: 6.8 g/dL (ref 6.5–8.1)

## 2019-05-08 LAB — MAGNESIUM: Magnesium: 1.7 mg/dL (ref 1.7–2.4)

## 2019-05-08 LAB — CBC WITH DIFFERENTIAL/PLATELET
Abs Immature Granulocytes: 0.04 10*3/uL (ref 0.00–0.07)
Basophils Absolute: 0 10*3/uL (ref 0.0–0.1)
Basophils Relative: 0 %
Eosinophils Absolute: 0.1 10*3/uL (ref 0.0–0.5)
Eosinophils Relative: 2 %
HCT: 27.4 % — ABNORMAL LOW (ref 36.0–46.0)
Hemoglobin: 9.1 g/dL — ABNORMAL LOW (ref 12.0–15.0)
Immature Granulocytes: 1 %
Lymphocytes Relative: 4 %
Lymphs Abs: 0.2 10*3/uL — ABNORMAL LOW (ref 0.7–4.0)
MCH: 28.6 pg (ref 26.0–34.0)
MCHC: 33.2 g/dL (ref 30.0–36.0)
MCV: 86.2 fL (ref 80.0–100.0)
Monocytes Absolute: 0.5 10*3/uL (ref 0.1–1.0)
Monocytes Relative: 10 %
Neutro Abs: 3.8 10*3/uL (ref 1.7–7.7)
Neutrophils Relative %: 83 %
Platelets: 222 10*3/uL (ref 150–400)
RBC: 3.18 MIL/uL — ABNORMAL LOW (ref 3.87–5.11)
RDW: 24.4 % — ABNORMAL HIGH (ref 11.5–15.5)
WBC: 4.6 10*3/uL (ref 4.0–10.5)
nRBC: 0 % (ref 0.0–0.2)

## 2019-05-08 LAB — SAMPLE TO BLOOD BANK

## 2019-05-08 SURGERY — INSERTION, UTERINE TANDEM AND RING OR CYLINDER, FOR BRACHYTHERAPY
Anesthesia: General

## 2019-05-08 MED ORDER — SODIUM CHLORIDE 0.9% FLUSH
10.0000 mL | Freq: Once | INTRAVENOUS | Status: AC
  Administered 2019-05-08: 10 mL
  Filled 2019-05-08: qty 10

## 2019-05-08 MED ORDER — ALTEPLASE 2 MG IJ SOLR
INTRAMUSCULAR | Status: AC
  Filled 2019-05-08: qty 2

## 2019-05-08 MED ORDER — HEPARIN SOD (PORK) LOCK FLUSH 100 UNIT/ML IV SOLN
500.0000 [IU] | Freq: Once | INTRAVENOUS | Status: DC
  Filled 2019-05-08: qty 5

## 2019-05-08 NOTE — Assessment & Plan Note (Signed)
Clinically, she appears to be responding to treatment She have minimal vaginal bleeding now Overall, she tolerated treatment fairly well except for intermittent diarrhea, nausea, very mild neuropathy and  anemia I will continue to see her on a weekly basis for follow-up and supportive care since she is doing so well, with her young age and recent upfront dose adjustment of chemotherapy, I recommend we proceed with 1 more dose this week and that would be her final treatment with chemotherapy She understand the plan of care

## 2019-05-08 NOTE — Assessment & Plan Note (Signed)
she has mild peripheral neuropathy, likely related to side effects of treatment. It is only mild, not bothering the patient. I will observe for now If it gets worse in the future, I will consider modifying the dose of the treatment  

## 2019-05-08 NOTE — Assessment & Plan Note (Signed)
Renal function continues to improve We will observe carefully The patient will continue aggressive hydration as tolerated

## 2019-05-08 NOTE — H&P (View-Only) (Signed)
Radiation Oncology         [phone] ________________________________  History and Physical Examination  Name: Tamara Osborne MRN: [id number]  Date: 05/08/19  DOB: 1975-06-19   DIAGNOSIS: The encounter diagnosis was Cervical cancer ([place]).  Stage II-B squamous cell carcinoma of the cervix    HISTORY OF PRESENT ILLNESS::Tamara Osborne is a 44 y.o. female who is seen at the courtesy of Dr. Berline Lopes for consideration for urgent radiation therapy what appears to be at least locally advanced cervical cancer.  The patient presented to urgent care on 03/13/2018 with metrorrhagia. She proceeded to the ED the following day with hypertension and anemia due to blood loss. She reported heavier bleeding than with her usual menstrual cycles. She was transfused with two units of blood and sent home. She was referred to Dr. Sabra Heck on 03/16/2019. On physical exam, she was noted to have an enlarged uterus versus a pelvic mass. Pelvic ultrasound performed at that time showed two 2.5 cm fibroids. Pap smear performed that day revealed HR HPV positivity and squamous cell carcinoma.   The patient returned for biopsy on 03/21/2019 of the large cervical lesion, which caused significant bleeding. Pathology from the procedure confirmed squamous cell carcinoma, at least in situ. Her bleeding was stopped.  She had a recurrence of her bleeding yesterday, 03/22/2019, morning and was seen by Dr. Sabra Heck. The bleeding improved for a while but increased to golf-ball-sized blood clots again, prompting her to present to the ED again.   She presented to Dr. Charisse March office first thing this morning, anxious regarding her bleeding. [name], NP met with the patient first and packed the vagina, as well as placed a foley catheter. Dr. Berline Lopes wrote this regarding the physical exam, "Packing removed from the vagina.  Upon placement of speculum, active bleeding noted from the cervix, which was somewhat difficult to see.  Anterior  cervix with visible lesion extending almost to the edge of the face of the cervix.  Posterior aspect of the cervix enlarged and unable to visualize entire cervix.  There is friable, hyper vascular, fleshy and fungating tissue along the posterior aspect of the cervix.  Speculum removed and bimanual exam performed.  Cervix almost 8 cm in size, barrel-shaped and nodular, especially posterior aspect.  No obliteration of the fornices and vagina feels uninvolved.  On rectovaginal exam, thickness of bilateral parametria noted although could not appreciate extension to the sidewall."   Repeat biopsy of the cervix was obtained in the hopes of confirming an invasive diagnosis.   The patient has completed her External beam and chemotherapy and is now ready to proceed with brachytherapy.   PAST MEDICAL HISTORY:  Past Medical History:  Diagnosis Date  . Anxiety   . Bartholin cyst   . Cervical cancer ([place])   . Depression   . Deviated septum   . History of blood transfusion    2 units given 04-26-2019, has had total of 8 units since jan 2021  . Hypertension   . Obesity   . PONV (postoperative nausea and vomiting)   . Preeclampsia 2006  . Sleep apnea    no cpap used insurance would not cover, osa severe per pt    PAST SURGICAL HISTORY: Past Surgical History:  Procedure Laterality Date  . CESAREAN SECTION WITH BILATERAL TUBAL LIGATION  11/10/2004      . DILATION AND CURETTAGE OF UTERUS  1997  . IR IMAGING GUIDED PORT INSERTION  04/04/2019    FAMILY HISTORY:  Family History  Problem Relation Age of Onset  . Brain cancer Mother        lung cancer to brain  . Diabetes Father   . Kidney disease Father   . Cancer Father        kidney cancer  . Heart failure Sister     SOCIAL HISTORY:  Social History   Tobacco Use  . Smoking status: Never Smoker  . Smokeless tobacco: Never Used  Substance Use Topics  . Alcohol use: No  . Drug use: No    ALLERGIES:  Allergies  Allergen Reactions  .  Penicillins     Not sure a child allergy  . Shellfish Allergy Swelling    Seafood also any kind  . Sulfa Antibiotics     Not sure a child allergy    MEDICATIONS:  No current facility-administered medications for this encounter.   Current Outpatient Medications  Medication Sig Dispense Refill  . amLODipine (NORVASC) 10 MG tablet Take 1 tablet (10 mg total) by mouth daily. (Patient taking differently: Take 10 mg by mouth every evening. ) 90 tablet 0  . cloNIDine (CATAPRES) 0.1 MG tablet Take 1 tablet (0.1 mg total) by mouth 2 (two) times daily as needed (blood pressure >180/110). 50 tablet 0  . diphenhydramine-acetaminophen (TYLENOL PM) 25-500 MG TABS tablet Take 1 tablet by mouth at bedtime as needed.    . fluticasone (FLONASE) 50 MCG/ACT nasal spray Place 1 spray into both nostrils daily as needed for allergies or rhinitis.    . hydrOXYzine (ATARAX/VISTARIL) 25 MG tablet Take 1 tablet (25 mg total) by mouth every 6 (six) hours as needed for anxiety. 20 tablet 0  . lidocaine-prilocaine (EMLA) cream Apply to affected area once 30 g 3  . loperamide (IMODIUM) 2 MG capsule Take by mouth as needed for diarrhea or loose stools.    Marland Kitchen LORazepam (ATIVAN) 0.5 MG tablet Take 1 tablet (0.5 mg total) by mouth every 8 (eight) hours as needed for anxiety. 30 tablet 0  . magnesium oxide (MAG-OX) 400 (241.3 Mg) MG tablet Take 1 tablet (400 mg total) by mouth 2 (two) times daily. (Patient taking differently: Take 400 mg by mouth daily. ) 60 tablet 1  . ondansetron (ZOFRAN) 8 MG tablet Take 1 tablet (8 mg total) by mouth every 8 (eight) hours as needed. 30 tablet 1  . prochlorperazine (COMPAZINE) 10 MG tablet Take 1 tablet (10 mg total) by mouth every 6 (six) hours as needed (Nausea or vomiting). 30 tablet 1  . senna-docusate (SENOKOT-S) 8.6-50 MG tablet Take 2 tablets by mouth at bedtime. To prevent constipation, do not take if having loose stools (Patient not taking: Reported on 05/02/2019) 60 tablet 1     REVIEW OF SYSTEMS:  A 10+ POINT REVIEW OF SYSTEMS WAS OBTAINED including neurology, dermatology, psychiatry, cardiac, respiratory, lymph, extremities, GI, GU, musculoskeletal, constitutional, reproductive, HEENT.  History of significant vaginal bleeding as above.  She denies any low back pain or pelvic pain.  She denies any abdominal bloating. Vaginal bleeding has now stopped.   PHYSICAL EXAM:  Vitals - 1 value per visit 7/59/1638  SYSTOLIC 466  DIASTOLIC 97  Pulse 99  Temperature 98.4  Respirations 14  Weight (lb)   Height   BMI   VISIT REPORT   General: Alert and oriented, in no acute distress, somewhat anxious, appreciative of urgent workin HEENT: Head is normocephalic. Extraocular movements are intact.  Neck: Neck is supple, no palpable cervical or supraclavicular lymphadenopathy. Heart: Regular in rate and  rhythm with no murmurs, rubs, or gallops. Chest: Clear to auscultation bilaterally, with no rhonchi, wheezes, or rales. Abdomen: Soft, nontender, nondistended, with no rigidity or guarding. Extremities: No cyanosis or edema. Lymphatics: see Neck Exam Skin: No concerning lesions. Musculoskeletal: symmetric strength and muscle tone throughout. Neurologic: Cranial nerves II through XII are grossly intact. No obvious focalities. Speech is fluent. Coordination is intact. Psychiatric: Judgment and insight are intact. Affect is appropriate. Pelvic exam deferred in light of recent problems with significant bleeding and well-documented exam by Dr. Berline Lopes as above.  ECOG = 1    LABORATORY DATA:  Lab Results  Component Value Date   WBC 4.6 05/08/2019   HGB 9.1 (L) 05/08/2019   HCT 27.4 (L) 05/08/2019   MCV 86.2 05/08/2019   PLT 222 05/08/2019   NEUTROABS 3.8 05/08/2019   Lab Results  Component Value Date   NA 139 05/08/2019   K 4.2 05/08/2019   CL 104 05/08/2019   CO2 26 05/08/2019   GLUCOSE 110 (H) 05/08/2019   CREATININE 1.38 (H) 05/08/2019   CALCIUM 8.7 (L)  05/08/2019      RADIOGRAPHY: No results found.    IMPRESSION: Stage II-B squamous cell carcinoma of the cervix    She is now ready to proceed with Brachytherapy   PLAN: she will be taken to the operating room 3/9 for EUA and placement of tandem/ring for high dose rate radiation therapy.  Iridium 192 will be used. Five procedures planned.    ------------------------------------------------  Blair Promise, PhD, MD  This document serves as a record of services personally performed by Gery Pray, MD. It was created on his behalf by Wilburn Mylar, a trained medical scribe. The creation of this record is based on the scribe's personal observations and the provider's statements to them. This document has been checked and approved by the attending provider.

## 2019-05-08 NOTE — Progress Notes (Signed)
ADDENDUM to original note by Zelphia Cairo RN dated 05-02-2019.  Chart reviewed by anesthesia, Konrad Felix PA, do another EKG dos, pending this result pt ok to proceed.

## 2019-05-08 NOTE — Patient Instructions (Signed)

## 2019-05-08 NOTE — Progress Notes (Signed)
Counseling intern spoke to pt on 2021 for a telehealth counseling session via phone. Counseling intern provided space for pt to open up about experiences and responded with empathy, compassion, and normalization of feelings. Pt reported feeling somewhat anxious about her surgery tomorrow, especially about being put to sleep. Pt stated she has thoroughly prepare for her surgery, which helps her feel less anxious. CI and pt reviewed using deep breathing exercises when she feels nervous. Pt stated she found the breathing exercises helpful during her last chemo appt. CI also went over Paired Muscle Relaxation which combines deep breathing and muscle relaxation. Pt agreed to try this technique. Pt has signed up for 4 [facility] online support programs and she is excited to have these to look forward to later this week as she recovers at home from her surgery.   Next Counseling Appt: Tuesday, May 16, 2019 11:00-noon via phone  [facility] Voicemail:  [phone]

## 2019-05-08 NOTE — H&P (Signed)
Radiation Oncology         [phone] ________________________________  History and Physical Examination  Name: Tamara Osborne MRN: [id number]  Date: 05/08/19  DOB: 1975-06-19   DIAGNOSIS: The encounter diagnosis was Cervical cancer ([place]).  Stage II-B squamous cell carcinoma of the cervix    HISTORY OF PRESENT ILLNESS::Tamara Osborne is a 44 y.o. female who is seen at the courtesy of Dr. Berline Lopes for consideration for urgent radiation therapy what appears to be at least locally advanced cervical cancer.  The patient presented to urgent care on 03/13/2018 with metrorrhagia. She proceeded to the ED the following day with hypertension and anemia due to blood loss. She reported heavier bleeding than with her usual menstrual cycles. She was transfused with two units of blood and sent home. She was referred to Dr. Sabra Heck on 03/16/2019. On physical exam, she was noted to have an enlarged uterus versus a pelvic mass. Pelvic ultrasound performed at that time showed two 2.5 cm fibroids. Pap smear performed that day revealed HR HPV positivity and squamous cell carcinoma.   The patient returned for biopsy on 03/21/2019 of the large cervical lesion, which caused significant bleeding. Pathology from the procedure confirmed squamous cell carcinoma, at least in situ. Her bleeding was stopped.  She had a recurrence of her bleeding yesterday, 03/22/2019, morning and was seen by Dr. Sabra Heck. The bleeding improved for a while but increased to golf-ball-sized blood clots again, prompting her to present to the ED again.   She presented to Dr. Charisse March office first thing this morning, anxious regarding her bleeding. [name], NP met with the patient first and packed the vagina, as well as placed a foley catheter. Dr. Berline Lopes wrote this regarding the physical exam, "Packing removed from the vagina.  Upon placement of speculum, active bleeding noted from the cervix, which was somewhat difficult to see.  Anterior  cervix with visible lesion extending almost to the edge of the face of the cervix.  Posterior aspect of the cervix enlarged and unable to visualize entire cervix.  There is friable, hyper vascular, fleshy and fungating tissue along the posterior aspect of the cervix.  Speculum removed and bimanual exam performed.  Cervix almost 8 cm in size, barrel-shaped and nodular, especially posterior aspect.  No obliteration of the fornices and vagina feels uninvolved.  On rectovaginal exam, thickness of bilateral parametria noted although could not appreciate extension to the sidewall."   Repeat biopsy of the cervix was obtained in the hopes of confirming an invasive diagnosis.   The patient has completed her External beam and chemotherapy and is now ready to proceed with brachytherapy.   PAST MEDICAL HISTORY:  Past Medical History:  Diagnosis Date  . Anxiety   . Bartholin cyst   . Cervical cancer ([place])   . Depression   . Deviated septum   . History of blood transfusion    2 units given 04-26-2019, has had total of 8 units since jan 2021  . Hypertension   . Obesity   . PONV (postoperative nausea and vomiting)   . Preeclampsia 2006  . Sleep apnea    no cpap used insurance would not cover, osa severe per pt    PAST SURGICAL HISTORY: Past Surgical History:  Procedure Laterality Date  . CESAREAN SECTION WITH BILATERAL TUBAL LIGATION  11/10/2004      . DILATION AND CURETTAGE OF UTERUS  1997  . IR IMAGING GUIDED PORT INSERTION  04/04/2019    FAMILY HISTORY:  Family History  Problem Relation Age of Onset  . Brain cancer Mother        lung cancer to brain  . Diabetes Father   . Kidney disease Father   . Cancer Father        kidney cancer  . Heart failure Sister     SOCIAL HISTORY:  Social History   Tobacco Use  . Smoking status: Never Smoker  . Smokeless tobacco: Never Used  Substance Use Topics  . Alcohol use: No  . Drug use: No    ALLERGIES:  Allergies  Allergen Reactions  .  Penicillins     Not sure a child allergy  . Shellfish Allergy Swelling    Seafood also any kind  . Sulfa Antibiotics     Not sure a child allergy    MEDICATIONS:  No current facility-administered medications for this encounter.   Current Outpatient Medications  Medication Sig Dispense Refill  . amLODipine (NORVASC) 10 MG tablet Take 1 tablet (10 mg total) by mouth daily. (Patient taking differently: Take 10 mg by mouth every evening. ) 90 tablet 0  . cloNIDine (CATAPRES) 0.1 MG tablet Take 1 tablet (0.1 mg total) by mouth 2 (two) times daily as needed (blood pressure >180/110). 50 tablet 0  . diphenhydramine-acetaminophen (TYLENOL PM) 25-500 MG TABS tablet Take 1 tablet by mouth at bedtime as needed.    . fluticasone (FLONASE) 50 MCG/ACT nasal spray Place 1 spray into both nostrils daily as needed for allergies or rhinitis.    . hydrOXYzine (ATARAX/VISTARIL) 25 MG tablet Take 1 tablet (25 mg total) by mouth every 6 (six) hours as needed for anxiety. 20 tablet 0  . lidocaine-prilocaine (EMLA) cream Apply to affected area once 30 g 3  . loperamide (IMODIUM) 2 MG capsule Take by mouth as needed for diarrhea or loose stools.    Marland Kitchen LORazepam (ATIVAN) 0.5 MG tablet Take 1 tablet (0.5 mg total) by mouth every 8 (eight) hours as needed for anxiety. 30 tablet 0  . magnesium oxide (MAG-OX) 400 (241.3 Mg) MG tablet Take 1 tablet (400 mg total) by mouth 2 (two) times daily. (Patient taking differently: Take 400 mg by mouth daily. ) 60 tablet 1  . ondansetron (ZOFRAN) 8 MG tablet Take 1 tablet (8 mg total) by mouth every 8 (eight) hours as needed. 30 tablet 1  . prochlorperazine (COMPAZINE) 10 MG tablet Take 1 tablet (10 mg total) by mouth every 6 (six) hours as needed (Nausea or vomiting). 30 tablet 1  . senna-docusate (SENOKOT-S) 8.6-50 MG tablet Take 2 tablets by mouth at bedtime. To prevent constipation, do not take if having loose stools (Patient not taking: Reported on 05/02/2019) 60 tablet 1     REVIEW OF SYSTEMS:  A 10+ POINT REVIEW OF SYSTEMS WAS OBTAINED including neurology, dermatology, psychiatry, cardiac, respiratory, lymph, extremities, GI, GU, musculoskeletal, constitutional, reproductive, HEENT.  History of significant vaginal bleeding as above.  She denies any low back pain or pelvic pain.  She denies any abdominal bloating. Vaginal bleeding has now stopped.   PHYSICAL EXAM:  Vitals - 1 value per visit 7/59/1638  SYSTOLIC 466  DIASTOLIC 97  Pulse 99  Temperature 98.4  Respirations 14  Weight (lb)   Height   BMI   VISIT REPORT   General: Alert and oriented, in no acute distress, somewhat anxious, appreciative of urgent workin HEENT: Head is normocephalic. Extraocular movements are intact.  Neck: Neck is supple, no palpable cervical or supraclavicular lymphadenopathy. Heart: Regular in rate and  rhythm with no murmurs, rubs, or gallops. Chest: Clear to auscultation bilaterally, with no rhonchi, wheezes, or rales. Abdomen: Soft, nontender, nondistended, with no rigidity or guarding. Extremities: No cyanosis or edema. Lymphatics: see Neck Exam Skin: No concerning lesions. Musculoskeletal: symmetric strength and muscle tone throughout. Neurologic: Cranial nerves II through XII are grossly intact. No obvious focalities. Speech is fluent. Coordination is intact. Psychiatric: Judgment and insight are intact. Affect is appropriate. Pelvic exam deferred in light of recent problems with significant bleeding and well-documented exam by Dr. Berline Lopes as above.  ECOG = 1    LABORATORY DATA:  Lab Results  Component Value Date   WBC 4.6 05/08/2019   HGB 9.1 (L) 05/08/2019   HCT 27.4 (L) 05/08/2019   MCV 86.2 05/08/2019   PLT 222 05/08/2019   NEUTROABS 3.8 05/08/2019   Lab Results  Component Value Date   NA 139 05/08/2019   K 4.2 05/08/2019   CL 104 05/08/2019   CO2 26 05/08/2019   GLUCOSE 110 (H) 05/08/2019   CREATININE 1.38 (H) 05/08/2019   CALCIUM 8.7 (L)  05/08/2019      RADIOGRAPHY: No results found.    IMPRESSION: Stage II-B squamous cell carcinoma of the cervix    She is now ready to proceed with Brachytherapy   PLAN: she will be taken to the operating room 3/9 for EUA and placement of tandem/ring for high dose rate radiation therapy.  Iridium 192 will be used. Five procedures planned.    ------------------------------------------------  Blair Promise, PhD, MD  This document serves as a record of services personally performed by Gery Pray, MD. It was created on his behalf by Wilburn Mylar, a trained medical scribe. The creation of this record is based on the scribe's personal observations and the provider's statements to them. This document has been checked and approved by the attending provider.

## 2019-05-08 NOTE — Progress Notes (Signed)
[place] OFFICE PROGRESS NOTE  Patient Care Team: Pllc, [facility] as PCP - General (Family Medicine)  ASSESSMENT & PLAN:  Squamous cell carcinoma of cervix ([place]) Clinically, she appears to be responding to treatment She have minimal vaginal bleeding now Overall, she tolerated treatment fairly well except for intermittent diarrhea, nausea, very mild neuropathy and  anemia I will continue to see her on a weekly basis for follow-up and supportive care since she is doing so well, with her young age and recent upfront dose adjustment of chemotherapy, I recommend we proceed with 1 more dose this week and that would be her final treatment with chemotherapy She understand the plan of care  CKD (chronic kidney disease), symptom management only, stage 3 (moderate) Renal function continues to improve We will observe carefully The patient will continue aggressive hydration as tolerated  Peripheral neuropathy due to chemotherapy [facility]) she has mild peripheral neuropathy, likely related to side effects of treatment. It is only mild, not bothering the patient. I will observe for now If it gets worse in the future, I will consider modifying the dose of the treatment    No orders of the defined types were placed in this encounter.   All questions were answered. The patient knows to call the clinic with any problems, questions or concerns. The total time spent in the appointment was 20 minutes encounter with patients including review of chart and various tests results, discussions about plan of care and coordination of care plan   [name], MD 05/08/2019 11:55 AM  INTERVAL HISTORY: Please see below for problem oriented charting. She returns for her weekly follow-up she had some mild nausea and intermittent diarrhea but overall improved she has very minimum vaginal discharge she continues to have very mild sensation of tingling at the tips of fingers but it  does not bother her  SUMMARY OF ONCOLOGIC HISTORY: Oncology History  Squamous cell carcinoma of cervix ([place])  03/21/2019 Pathology Results   The biopsy is superficial and therefore depth of invasion cannot be  determined.  There is at least carcinoma in-situ.    03/23/2019 Initial Diagnosis   Squamous cell carcinoma of cervix ([place])   03/23/2019 Pathology Results   CERVIX, 11 O CLOCK, BIOPSY:  -  Squamous cell carcinoma, invasive  -  See comment   03/28/2019 PET scan   1. Hypermetabolic cervical mass measures roughly 7.6 by 6.9 by 4.9 cm, compatible with malignancy. No adenopathy or distant metastatic spread identified. 2. Extending cephalad and anterior to the uterine fundus, there is a 450 cubic cm simple fluid density structure without hypermetabolic activity. This may represent a right adnexal cyst (favored), peritoneal inclusion cyst, or (if the patient has a remote history of pancreatitis) a remote pseudocyst. 3. Faint ground density nodularity in both lower lobes which could be from atypical infection or extrinsic allergic alveolitis. Given nodular appearance of some of this ground-glass density, I would recommend follow up chest CT in 3 months time in order to ensure clearance. 4. Moderate cardiomegaly, cause uncertain. The patient also has advanced for age/gender atherosclerosis. Aortic Atherosclerosis (ICD10-I70.0).     04/04/2019 Procedure   Successful placement of a right internal jugular approach power injectable Port-A-Cath. The catheter is ready for immediate use   04/07/2019 -  Chemotherapy   The patient had weekly cisplatin for chemotherapy treatment.       REVIEW OF SYSTEMS:   Constitutional: Denies fevers, chills or abnormal weight loss Eyes: Denies blurriness of  vision Ears, nose, mouth, throat, and face: Denies mucositis or sore throat Respiratory: Denies cough, dyspnea or wheezes Cardiovascular: Denies palpitation, chest discomfort or lower extremity  swelling Skin: Denies abnormal skin rashes Lymphatics: Denies new lymphadenopathy or easy bruising Behavioral/Psych: Mood is stable, no new changes  All other systems were reviewed with the patient and are negative.  I have reviewed the past medical history, past surgical history, social history and family history with the patient and they are unchanged from previous note.  ALLERGIES:  is allergic to penicillins; shellfish allergy; and sulfa antibiotics.  MEDICATIONS:  Current Outpatient Medications  Medication Sig Dispense Refill  . amLODipine (NORVASC) 10 MG tablet Take 1 tablet (10 mg total) by mouth daily. (Patient taking differently: Take 10 mg by mouth every evening. ) 90 tablet 0  . cloNIDine (CATAPRES) 0.1 MG tablet Take 1 tablet (0.1 mg total) by mouth 2 (two) times daily as needed (blood pressure >180/110). 50 tablet 0  . diphenhydramine-acetaminophen (TYLENOL PM) 25-500 MG TABS tablet Take 1 tablet by mouth at bedtime as needed.    . fluticasone (FLONASE) 50 MCG/ACT nasal spray Place 1 spray into both nostrils daily as needed for allergies or rhinitis.    . hydrOXYzine (ATARAX/VISTARIL) 25 MG tablet Take 1 tablet (25 mg total) by mouth every 6 (six) hours as needed for anxiety. 20 tablet 0  . lidocaine-prilocaine (EMLA) cream Apply to affected area once 30 g 3  . loperamide (IMODIUM) 2 MG capsule Take by mouth as needed for diarrhea or loose stools.    Marland Kitchen LORazepam (ATIVAN) 0.5 MG tablet Take 1 tablet (0.5 mg total) by mouth every 8 (eight) hours as needed for anxiety. 30 tablet 0  . magnesium oxide (MAG-OX) 400 (241.3 Mg) MG tablet Take 1 tablet (400 mg total) by mouth 2 (two) times daily. (Patient taking differently: Take 400 mg by mouth daily. ) 60 tablet 1  . ondansetron (ZOFRAN) 8 MG tablet Take 1 tablet (8 mg total) by mouth every 8 (eight) hours as needed. 30 tablet 1  . prochlorperazine (COMPAZINE) 10 MG tablet Take 1 tablet (10 mg total) by mouth every 6 (six) hours as  needed (Nausea or vomiting). 30 tablet 1  . senna-docusate (SENOKOT-S) 8.6-50 MG tablet Take 2 tablets by mouth at bedtime. To prevent constipation, do not take if having loose stools (Patient not taking: Reported on 05/02/2019) 60 tablet 1   No current facility-administered medications for this visit.    PHYSICAL EXAMINATION: ECOG PERFORMANCE STATUS: 1 - Symptomatic but completely ambulatory  Vitals:   05/08/19 0953  BP: (!) 163/90  Pulse: (!) 107  Resp: 18  Temp: 98.7 F (37.1 C)  SpO2: 100%   Filed Weights   05/08/19 0953  Weight: 201 lb 9.6 oz (91.4 kg)    GENERAL:alert, no distress and comfortable SKIN: skin color, texture, turgor are normal, no rashes or significant lesions EYES: normal, Conjunctiva are pink and non-injected, sclera clear OROPHARYNX:no exudate, no erythema and lips, buccal mucosa, and tongue normal  NECK: supple, thyroid normal size, non-tender, without nodularity LYMPH:  no palpable lymphadenopathy in the cervical, axillary or inguinal LUNGS: clear to auscultation and percussion with normal breathing effort HEART: regular rate & rhythm and no murmurs and no lower extremity edema ABDOMEN:abdomen soft, non-tender and normal bowel sounds Musculoskeletal:no cyanosis of digits and no clubbing  NEURO: alert & oriented x 3 with fluent speech, no focal motor/sensory deficits  LABORATORY DATA:  I have reviewed the data as listed  Component Value Date/Time   NA 139 05/08/2019 0906   K 4.2 05/08/2019 0906   CL 104 05/08/2019 0906   CO2 26 05/08/2019 0906   GLUCOSE 110 (H) 05/08/2019 0906   BUN 29 (H) 05/08/2019 0906   CREATININE 1.38 (H) 05/08/2019 0906   CALCIUM 8.7 (L) 05/08/2019 0906   PROT 6.8 05/08/2019 0906   ALBUMIN 3.6 05/08/2019 0906   AST 15 05/08/2019 0906   ALT 29 05/08/2019 0906   ALKPHOS 92 05/08/2019 0906   BILITOT 0.5 05/08/2019 0906   GFRNONAA 47 (L) 05/08/2019 0906   GFRAA 55 (L) 05/08/2019 0906    No results found for: SPEP,  UPEP  Lab Results  Component Value Date   WBC 4.6 05/08/2019   NEUTROABS 3.8 05/08/2019   HGB 9.1 (L) 05/08/2019   HCT 27.4 (L) 05/08/2019   MCV 86.2 05/08/2019   PLT 222 05/08/2019      Chemistry      Component Value Date/Time   NA 139 05/08/2019 0906   K 4.2 05/08/2019 0906   CL 104 05/08/2019 0906   CO2 26 05/08/2019 0906   BUN 29 (H) 05/08/2019 0906   CREATININE 1.38 (H) 05/08/2019 0906      Component Value Date/Time   CALCIUM 8.7 (L) 05/08/2019 0906   ALKPHOS 92 05/08/2019 0906   AST 15 05/08/2019 0906   ALT 29 05/08/2019 0906   BILITOT 0.5 05/08/2019 0906

## 2019-05-08 NOTE — Progress Notes (Signed)
Educated [place] on what to expect with the tandem and ring procedure tomorrow. Encouraged her to call with any questions.

## 2019-05-09 ENCOUNTER — Ambulatory Visit (HOSPITAL_BASED_OUTPATIENT_CLINIC_OR_DEPARTMENT_OTHER)
Admission: RE | Admit: 2019-05-09 | Discharge: 2019-05-09 | Disposition: A | Discharge status: Still In Hospital | Payer: Medicaid Other | Source: Ambulatory Visit | Attending: Radiation Oncology | Admitting: Radiation Oncology

## 2019-05-09 ENCOUNTER — Ambulatory Visit
Admission: RE | Admit: 2019-05-09 | Discharge: 2019-05-09 | Disposition: A | Discharge status: Home / Self Care | Payer: Medicaid Other | Source: Ambulatory Visit | Attending: Radiation Oncology | Admitting: Radiation Oncology

## 2019-05-09 ENCOUNTER — Encounter (HOSPITAL_BASED_OUTPATIENT_CLINIC_OR_DEPARTMENT_OTHER): Payer: Self-pay | Admitting: Radiation Oncology

## 2019-05-09 ENCOUNTER — Ambulatory Visit (HOSPITAL_COMMUNITY)
Admission: RE | Admit: 2019-05-09 | Discharge: 2019-05-09 | Disposition: A | Discharge status: Home / Self Care | Payer: Medicaid Other | Source: Ambulatory Visit | Attending: Radiation Oncology | Admitting: Radiation Oncology

## 2019-05-09 ENCOUNTER — Encounter: Payer: Self-pay | Admitting: Oncology

## 2019-05-09 ENCOUNTER — Ambulatory Visit (HOSPITAL_BASED_OUTPATIENT_CLINIC_OR_DEPARTMENT_OTHER): Payer: Medicaid Other | Admitting: Physician Assistant

## 2019-05-09 ENCOUNTER — Ambulatory Visit: Payer: Self-pay | Admitting: Hematology and Oncology

## 2019-05-09 ENCOUNTER — Encounter (HOSPITAL_BASED_OUTPATIENT_CLINIC_OR_DEPARTMENT_OTHER)
Admission: RE | Disposition: A | Discharge status: Still In Hospital | Payer: Self-pay | Source: Ambulatory Visit | Attending: Radiation Oncology

## 2019-05-09 VITALS — BP 160/90 | HR 95 | Temp 97.3°F | Resp 24

## 2019-05-09 DIAGNOSIS — Z6836 Body mass index (BMI) 36.0-36.9, adult: Secondary | ICD-10-CM | POA: Insufficient documentation

## 2019-05-09 DIAGNOSIS — E669 Obesity, unspecified: Secondary | ICD-10-CM | POA: Insufficient documentation

## 2019-05-09 DIAGNOSIS — G4730 Sleep apnea, unspecified: Secondary | ICD-10-CM | POA: Diagnosis not present

## 2019-05-09 DIAGNOSIS — C539 Malignant neoplasm of cervix uteri, unspecified: Secondary | ICD-10-CM

## 2019-05-09 DIAGNOSIS — Z79899 Other long term (current) drug therapy: Secondary | ICD-10-CM | POA: Diagnosis not present

## 2019-05-09 DIAGNOSIS — I10 Essential (primary) hypertension: Secondary | ICD-10-CM | POA: Insufficient documentation

## 2019-05-09 HISTORY — PX: OPERATIVE ULTRASOUND: SHX5996

## 2019-05-09 HISTORY — PX: TANDEM RING INSERTION: SHX6199

## 2019-05-09 HISTORY — DX: Personal history of other medical treatment: Z92.89

## 2019-05-09 LAB — POCT PREGNANCY, URINE: Preg Test, Ur: NEGATIVE

## 2019-05-09 SURGERY — INSERTION, UTERINE TANDEM AND RING OR CYLINDER, FOR BRACHYTHERAPY
Anesthesia: General | Site: Cervix

## 2019-05-09 MED ORDER — ONDANSETRON HCL 4 MG/2ML IJ SOLN
4.0000 mg | Freq: Once | INTRAMUSCULAR | Status: DC | PRN
  Filled 2019-05-09: qty 2

## 2019-05-09 MED ORDER — SODIUM CHLORIDE 0.9 % IR SOLN
Status: DC | PRN
  Administered 2019-05-09: 250 mL

## 2019-05-09 MED ORDER — FENTANYL CITRATE (PF) 100 MCG/2ML IJ SOLN
INTRAMUSCULAR | Status: AC
  Filled 2019-05-09: qty 2

## 2019-05-09 MED ORDER — PROPOFOL 10 MG/ML IV BOLUS
INTRAVENOUS | Status: DC | PRN
  Administered 2019-05-09: 200 mg via INTRAVENOUS

## 2019-05-09 MED ORDER — ONDANSETRON HCL 4 MG/2ML IJ SOLN
INTRAMUSCULAR | Status: DC | PRN
  Administered 2019-05-09: 4 mg via INTRAVENOUS

## 2019-05-09 MED ORDER — FLUCONAZOLE 100 MG PO TABS: 100.0000 mg | ORAL_TABLET | Freq: Every day | ORAL | 0 refills | Status: DC

## 2019-05-09 MED ORDER — DEXAMETHASONE SODIUM PHOSPHATE 10 MG/ML IJ SOLN
INTRAMUSCULAR | Status: AC
  Filled 2019-05-09: qty 1

## 2019-05-09 MED ORDER — MIDAZOLAM HCL 2 MG/2ML IJ SOLN
INTRAMUSCULAR | Status: DC | PRN
  Administered 2019-05-09: 2 mg via INTRAVENOUS

## 2019-05-09 MED ORDER — LIDOCAINE 2% (20 MG/ML) 5 ML SYRINGE
INTRAMUSCULAR | Status: AC
  Filled 2019-05-09: qty 5

## 2019-05-09 MED ORDER — PROPOFOL 10 MG/ML IV BOLUS
INTRAVENOUS | Status: AC
  Filled 2019-05-09: qty 40

## 2019-05-09 MED ORDER — FENTANYL CITRATE (PF) 100 MCG/2ML IJ SOLN
INTRAMUSCULAR | Status: DC | PRN
  Administered 2019-05-09: 25 ug via INTRAVENOUS
  Administered 2019-05-09: 50 ug via INTRAVENOUS
  Administered 2019-05-09: 25 ug via INTRAVENOUS

## 2019-05-09 MED ORDER — DEXAMETHASONE SODIUM PHOSPHATE 10 MG/ML IJ SOLN
INTRAMUSCULAR | Status: DC | PRN
  Administered 2019-05-09: 5 mg via INTRAVENOUS

## 2019-05-09 MED ORDER — KETOROLAC TROMETHAMINE 30 MG/ML IJ SOLN
30.0000 mg | Freq: Once | INTRAMUSCULAR | Status: DC | PRN
  Filled 2019-05-09: qty 1

## 2019-05-09 MED ORDER — FENTANYL CITRATE (PF) 100 MCG/2ML IJ SOLN
25.0000 ug | INTRAMUSCULAR | Status: DC | PRN
  Administered 2019-05-09: 25 ug via INTRAVENOUS
  Filled 2019-05-09: qty 1

## 2019-05-09 MED ORDER — ONDANSETRON HCL 4 MG/2ML IJ SOLN
INTRAMUSCULAR | Status: AC
  Filled 2019-05-09: qty 2

## 2019-05-09 MED ORDER — LIDOCAINE 2% (20 MG/ML) 5 ML SYRINGE
INTRAMUSCULAR | Status: DC | PRN
  Administered 2019-05-09: 100 mg via INTRAVENOUS

## 2019-05-09 MED ORDER — LACTATED RINGERS IV SOLN
INTRAVENOUS | Status: DC
  Filled 2019-05-09 (×2): qty 250

## 2019-05-09 MED ORDER — MIDAZOLAM HCL 2 MG/2ML IJ SOLN
INTRAMUSCULAR | Status: AC
  Filled 2019-05-09: qty 2

## 2019-05-09 MED ORDER — LACTATED RINGERS IV SOLN
INTRAVENOUS | Status: DC
  Filled 2019-05-09: qty 1000

## 2019-05-09 SURGICAL SUPPLY — 21 items
COVER WAND RF STERILE (DRAPES) ×4 IMPLANT
DRSG PAD ABDOMINAL 8X10 ST (GAUZE/BANDAGES/DRESSINGS) ×4 IMPLANT
GAUZE 4X4 16PLY RFD (DISPOSABLE) ×4 IMPLANT
GLOVE BIOGEL PI IND STRL 7.0 (GLOVE) ×2 IMPLANT
GLOVE BIOGEL PI IND STRL 7.5 (GLOVE) ×4 IMPLANT
GLOVE BIOGEL PI INDICATOR 7.0 (GLOVE) ×2
GLOVE BIOGEL PI INDICATOR 7.5 (GLOVE) ×4
GLOVE SURG SS PI 7.5 STRL IVOR (GLOVE) ×12 IMPLANT
GOWN STRL REUS W/TWL LRG LVL3 (GOWN DISPOSABLE) ×4 IMPLANT
GOWN STRL REUS W/TWL LRG LVL4 (GOWN DISPOSABLE) ×8 IMPLANT
HOLDER FOLEY CATH W/STRAP (MISCELLANEOUS) ×4 IMPLANT
HOVERMATT SINGLE USE (MISCELLANEOUS) ×4 IMPLANT
IV NS 1000ML (IV SOLUTION) ×2
IV NS 1000ML BAXH (IV SOLUTION) ×2 IMPLANT
IV SET EXTENSION GRAVITY 40 LF (IV SETS) ×4 IMPLANT
KIT TURNOVER CYSTO (KITS) ×4 IMPLANT
PACK VAGINAL MINOR WOMEN LF (CUSTOM PROCEDURE TRAY) ×4 IMPLANT
PAD OB MATERNITY 4.3X12.25 (PERSONAL CARE ITEMS) ×4 IMPLANT
TOWEL OR 17X26 10 PK STRL BLUE (TOWEL DISPOSABLE) ×4 IMPLANT
TRAY FOLEY W/BAG SLVR 14FR (SET/KITS/TRAYS/PACK) ×4 IMPLANT
WATER STERILE IRR 500ML POUR (IV SOLUTION) ×4 IMPLANT

## 2019-05-09 NOTE — Progress Notes (Signed)
  Radiation Oncology         [phone] ________________________________  Name: Tamara Osborne MRN: [id number]  Date: 05/09/2019  DOB: [birth year]  SIMULATION AND TREATMENT PLANNING NOTE HDR BRACHYTHERAPY  DIAGNOSIS: Stage II-B squamous cell carcinoma of the cervix    NARRATIVE:  The patient was brought to the [facility].  Identity was confirmed.  All relevant records and images related to the planned course of therapy were reviewed.  The patient freely provided informed written consent to proceed with treatment after reviewing the details related to the planned course of therapy. The consent form was witnessed and verified by the simulation staff.  Then, the patient was set-up in a stable reproducible supine position for radiation therapy.  CT images were obtained.  Surface markings were placed.  The CT images were loaded into the planning software.  Then the target and avoidance structures were contoured.  Treatment planning then occurred.  The radiation prescription was entered and confirmed.   I have requested : Brachytherapy Isodose Plan and Dosimetry Calculations to plan the radiation distribution.    PLAN:  The patient will receive 5.5 Gy in 1 fraction directed at the high risk clinical target volume.  The patient will be treated with the tandem ring system using iridium 192 is the high-dose-rate source.  ________________________________   Blair Promise, PhD, MD  This document serves as a record of services personally performed by Gery Pray, MD. It was created on his behalf by Clerance Lav, a trained medical scribe. The creation of this record is based on the scribe's personal observations and the provider's statements to them. This document has been checked and approved by the attending provider.

## 2019-05-09 NOTE — Interval H&P Note (Signed)
History and Physical Interval Note:  05/09/2019 7:23 AM  Tamara Osborne  has presented today for surgery, with the diagnosis of cervix cancer.  The various methods of treatment have been discussed with the patient and family. After consideration of risks, benefits and other options for treatment, the patient has consented to  Procedure(s): TANDEM RING INSERTION (N/A) OPERATIVE ULTRASOUND (N/A) as a surgical intervention.  The patient's history has been reviewed, patient examined, no change in status, stable for surgery.  I have reviewed the patient's chart and labs.  Questions were answered to the patient's satisfaction.     Gery Pray

## 2019-05-09 NOTE — Progress Notes (Signed)
  Radiation Oncology         [phone] ________________________________  Name: Tamara Osborne MRN: [id number]  Date: 05/09/2019  DOB: 12/01/1975  CC: [facility]  HDR BRACHYTHERAPY NOTE  DIAGNOSIS: Stage II-B squamous cell carcinoma of the cervix    NARRATIVE: The patient was brought to the HDR suite. Identity was confirmed. All relevant records and images related to the planned course of therapy were reviewed. The patient freely provided informed written consent to proceed with treatment after reviewing the details related to the planned course of therapy. The consent form was witnessed and verified by the simulation staff. Then, the patient was set-up in a stable reproducible supine position for radiation therapy. The tandem ring system was accessed and fiducial markers were placed within the tandem and ring.   Simple treatment device note: On the operating room the patient had construction of her custom tandem ring system. She will be treated with a 45 tandem/ring system. The patient had placement of a 60 mm tandem. A cervical ring with a small shielding was used for her treatment. A rectal paddle was also part of her custom set up device.  Verification simulation note: An AP and lateral film was obtained through the pelvis area. This was compared to the patient's planning films documenting accurate position of the tandem/ring system for treatment.  High-dose-rate brachytherapy treatment note:  The remote afterloading device was accessed through catheter system and attached to the tandem ring system. Patient then proceeded to undergo her first high-dose-rate treatment directed at the cervix. The patient was prescribed a dose of 6.0 gray to be delivered to the high risk clinical target volume.. Patient was treated with 2 channels using 26 dwell positions. Treatment time was 512.2 seconds. The patient tolerated the procedure well. After  completion of her therapy, a radiation survey was performed documenting return of the iridium source into the [employer] safe. The patient was then transferred to the nursing suite. She then had removal of the rectal paddle followed by the tandem and ring system. The patient tolerated the removal well.  PLAN: The patient will return next week for her second high-dose-rate treatment. ________________________________    Blair Promise, PhD, MD  This document serves as a record of services personally performed by Gery Pray, MD. It was created on his behalf by Clerance Lav, a trained medical scribe. The creation of this record is based on the scribe's personal observations and the provider's statements to them. This document has been checked and approved by the attending provider.

## 2019-05-09 NOTE — Transfer of Care (Signed)
Immediate Anesthesia Transfer of Care Note  Patient: Tamara Osborne  Procedure(s) Performed: Procedure(s) (LRB): TANDEM RING INSERTION (N/A) OPERATIVE ULTRASOUND (N/A)  Patient Location: PACU  Anesthesia Type: General  Level of Consciousness: awake, oriented, sedated and patient cooperative  Airway & Oxygen Therapy: Patient Spontanous Breathing and Patient connected to face mask oxygen  Post-op Assessment: Report given to PACU RN and Post -op Vital signs reviewed and stable  Post vital signs: Reviewed and stable  Complications: No apparent anesthesia complications  Last Vitals:  Vitals Value Taken Time  BP 164/96 05/09/19 0903  Temp    Pulse 103 05/09/19 0905  Resp 19 05/09/19 0905  SpO2 97 % 05/09/19 0905  Vitals shown include unvalidated device data.  Last Pain:  Vitals:   05/09/19 0621  TempSrc: Oral  PainSc: 4       Patients Stated Pain Goal: 3 (05/09/19 [id number])

## 2019-05-09 NOTE — Op Note (Signed)
05/09/2019  9:35 AM  PATIENT:  Tamara Osborne  44 y.o. female  PRE-OPERATIVE DIAGNOSIS:  cervix cancer  POST-OPERATIVE DIAGNOSIS:  cervix cancer  PROCEDURE:  Procedure(s): TANDEM RING INSERTION (N/A) OPERATIVE ULTRASOUND (N/A)  SURGEON:  Surgeon(s) and Role:    * Gery Pray, MD - Primary  PHYSICIAN ASSISTANT:   ASSISTANTS: none   ANESTHESIA:   general  EBL:  0 mL   BLOOD ADMINISTERED:none  DRAINS: Urinary Catheter (Foley)   LOCAL MEDICATIONS USED:  NONE  SPECIMEN:  No Specimen  DISPOSITION OF SPECIMEN:  N/A  COUNTS:  YES  TOURNIQUET:  * No tourniquets in log *  DICTATION: Patient was taken to outpatient OR #4.  Timeout was performed for the procedure, estimated length of the procedure and preoperative medications.  Patient was prepped and draped in the usual sterile fashion.  Betadine was not used in light of the patient's seafood allergy.  Patient had a Foley catheter placed without difficulty.  Later during the procedure the bladder was backfilled with approximately 250 cc of sterile saline for ultrasound imaging purposes.  Once the patient was asleep,  exam under anesthesia showed the cervical mass to be markedly decreased in size and is estimated to be approximately 3-1/2 x 3 cm in size.  No parametrial extension noted,  possibly slight extension into the anterior vaginal wall.  Patient proceeded to undergo dilation of the cervix.  The uterus sounded to 10.4 cm.  The uterus was mildly anteverted.  Of note was the patient was noted to have a yeast infection along the periurethral area as well as the proximal vagina and the cervical area.  There was some erythema noted to the cervical region but no active bleeding noted.  After dilation of the cervix the patient had a 60 mm cervical sleeve placed within the endocervical canal and endometrial canal.  She then had placement of a 60 mm tandem with a 45 degree orientation.  This was then followed by placement of a 45 degree  ring with a large shielding cap in place.  Patient then had placement of a rectal paddle posteriorly.  Excellent images were obtained confirming the placement of the tandem within the central portion of the lower uterus segment and endocervical canal.  Patient tolerated the procedure well.  She was transferred to the recovery room in stable condition.  Later in the day the patient will be transferred to radiation oncology for planning and her first high-dose-rate treatment.  She is to receive a dose of 5.5 Gy to the high risk clinical target volume.  She is to receive 4 additional HDR treatments after this initial one today.  PLAN OF CARE: Transferred to radiation oncology for planning and treatment  PATIENT DISPOSITION:  PACU - hemodynamically stable.   Delay start of Pharmacological VTE agent (>24hrs) due to surgical blood loss or risk of bleeding: not applicable

## 2019-05-09 NOTE — Anesthesia Procedure Notes (Signed)
Procedure Name: LMA Insertion Performed by: Patton Rabinovich D, CRNA Pre-anesthesia Checklist: Patient identified, Emergency Drugs available, Suction available and Patient being monitored Patient Re-evaluated:Patient Re-evaluated prior to induction Oxygen Delivery Method: Circle system utilized Preoxygenation: Pre-oxygenation with 100% oxygen Induction Type: IV induction Ventilation: Mask ventilation without difficulty LMA: LMA inserted LMA Size: 4.0 Number of attempts: 1 Airway Equipment and Method: Bite block Placement Confirmation: positive ETCO2 Tube secured with: Tape Dental Injury: Teeth and Oropharynx as per pre-operative assessment        

## 2019-05-09 NOTE — Anesthesia Preprocedure Evaluation (Signed)
Anesthesia Evaluation  Patient identified by MRN, date of birth, ID band Patient awake    Reviewed: Allergy & Precautions, NPO status , Patient's Chart, lab work & pertinent test results  History of Anesthesia Complications (+) PONV  Airway Mallampati: III  TM Distance: <3 FB Neck ROM: Full    Dental no notable dental hx.    Pulmonary sleep apnea ,    Pulmonary exam normal breath sounds clear to auscultation       Cardiovascular hypertension, negative cardio ROS Normal cardiovascular exam Rhythm:Regular Rate:Normal     Neuro/Psych negative neurological ROS  negative psych ROS   GI/Hepatic negative GI ROS, Neg liver ROS,   Endo/Other  negative endocrine ROS  Renal/GU negative Renal ROS  negative genitourinary   Musculoskeletal negative musculoskeletal ROS (+)   Abdominal   Peds negative pediatric ROS (+)  Hematology negative hematology ROS (+) anemia ,   Anesthesia Other Findings   Reproductive/Obstetrics negative OB ROS                             Anesthesia Physical Anesthesia Plan  ASA: III  Anesthesia Plan: General   Post-op Pain Management:    Induction: Intravenous  PONV Risk Score and Plan: 4 or greater and Ondansetron, Dexamethasone, Treatment may vary due to age or medical condition, Midazolam and Scopolamine patch - Pre-op  Airway Management Planned: LMA  Additional Equipment:   Intra-op Plan:   Post-operative Plan: Extubation in OR  Informed Consent: I have reviewed the patients History and Physical, chart, labs and discussed the procedure including the risks, benefits and alternatives for the proposed anesthesia with the patient or authorized representative who has indicated his/her understanding and acceptance.     Dental advisory given  Plan Discussed with: CRNA and Surgeon  Anesthesia Plan Comments:         Anesthesia Quick Evaluation

## 2019-05-09 NOTE — Anesthesia Postprocedure Evaluation (Signed)
Anesthesia Post Note  Patient: Tamara Osborne  Procedure(s) Performed: TANDEM RING INSERTION (N/A Cervix) OPERATIVE ULTRASOUND (N/A Abdomen)     Patient location during evaluation: PACU Anesthesia Type: General Level of consciousness: awake and alert Pain management: pain level controlled Vital Signs Assessment: post-procedure vital signs reviewed and stable Respiratory status: spontaneous breathing, nonlabored ventilation, respiratory function stable and patient connected to nasal cannula oxygen Cardiovascular status: blood pressure returned to baseline and stable Postop Assessment: no apparent nausea or vomiting Anesthetic complications: no    Last Vitals:  Vitals:   05/09/19 0915 05/09/19 0930  BP: (!) 183/108 (!) 165/72  Pulse: 97 93  Resp: 16 17  Temp:    SpO2: 97% 94%    Last Pain:  Vitals:   05/09/19 0930  TempSrc:   PainSc: 2                  [name] S

## 2019-05-10 ENCOUNTER — Other Ambulatory Visit: Payer: Self-pay

## 2019-05-10 NOTE — Progress Notes (Addendum)
Spoke w/ via phone for pre-op interview---Tamara Osborne needs dos---- urine poct             Osborne results------ekg 05-09-2019 epic COVID test ------05-12-2019 Arrive at -------530 am 05-15-2019 NPO after ------midnight Medications to take morning of surgery -----clonazepam prn, clondiine prn, lorazepam prn Diabetic medication -----n/a Patient Special Instructions ----- Pre-Op special Istructions ----- Patient verbalized understanding of instructions that were given at this phone interview. Patient denies shortness of breath, chest pain, fever, cough a this phone interview.  PATIENT STATES SHE WAS TOLD BY DR Iona Beard ROSE TO USE PORTA CATH FOR ALL FUTURE PROCEDURES DUE TO DIFFICULT IV ACCESS.

## 2019-05-11 ENCOUNTER — Telehealth: Payer: Self-pay | Admitting: Oncology

## 2019-05-11 NOTE — Telephone Encounter (Signed)
It's not a good idea to check after chemo Can you move labs to be done just before I see her on Tuesday next week?

## 2019-05-11 NOTE — Telephone Encounter (Signed)
Called [name] back and moved lab appointment to 9:30 on 05/16/19 and added a flush appointment at 9:45.  She verbalized understanding of new appointment date and time.

## 2019-05-11 NOTE — Telephone Encounter (Signed)
Tamara Osborne called and was wondering if she needs to have her labs checked before or after chemo on Friday.  She does have a lab appointment on Monday but it scheduled during her tandem and ring treatment.

## 2019-05-12 ENCOUNTER — Inpatient Hospital Stay: Payer: Medicaid Other

## 2019-05-12 ENCOUNTER — Other Ambulatory Visit: Payer: Self-pay

## 2019-05-12 ENCOUNTER — Other Ambulatory Visit (HOSPITAL_COMMUNITY)
Admission: RE | Admit: 2019-05-12 | Discharge: 2019-05-12 | Disposition: A | Discharge status: Home / Self Care | Payer: Medicaid Other | Source: Ambulatory Visit | Attending: Radiation Oncology | Admitting: Radiation Oncology

## 2019-05-12 VITALS — BP 157/92 | HR 95 | Temp 98.5°F | Resp 19 | Wt 203.0 lb

## 2019-05-12 DIAGNOSIS — Z5111 Encounter for antineoplastic chemotherapy: Secondary | ICD-10-CM | POA: Diagnosis not present

## 2019-05-12 DIAGNOSIS — Z20822 Contact with and (suspected) exposure to covid-19: Secondary | ICD-10-CM | POA: Diagnosis not present

## 2019-05-12 DIAGNOSIS — Z01812 Encounter for preprocedural laboratory examination: Secondary | ICD-10-CM | POA: Insufficient documentation

## 2019-05-12 DIAGNOSIS — C539 Malignant neoplasm of cervix uteri, unspecified: Secondary | ICD-10-CM

## 2019-05-12 LAB — SARS CORONAVIRUS 2 (TAT 6-24 HRS): SARS Coronavirus 2: NEGATIVE

## 2019-05-12 MED ORDER — HEPARIN SOD (PORK) LOCK FLUSH 100 UNIT/ML IV SOLN
500.0000 [IU] | Freq: Once | INTRAVENOUS | Status: AC | PRN
  Administered 2019-05-12: 500 [IU]
  Filled 2019-05-12: qty 5

## 2019-05-12 MED ORDER — DEXAMETHASONE SODIUM PHOSPHATE 10 MG/ML IJ SOLN
10.0000 mg | Freq: Once | INTRAMUSCULAR | Status: AC
  Administered 2019-05-12: 10 mg via INTRAVENOUS

## 2019-05-12 MED ORDER — SODIUM CHLORIDE 0.9% FLUSH
10.0000 mL | INTRAVENOUS | Status: DC | PRN
  Administered 2019-05-12: 10 mL
  Filled 2019-05-12: qty 10

## 2019-05-12 MED ORDER — PALONOSETRON HCL INJECTION 0.25 MG/5ML
INTRAVENOUS | Status: AC
  Filled 2019-05-12: qty 5

## 2019-05-12 MED ORDER — SODIUM CHLORIDE 0.9 % IV SOLN
150.0000 mg | Freq: Once | INTRAVENOUS | Status: AC
  Administered 2019-05-12: 150 mg via INTRAVENOUS
  Filled 2019-05-12: qty 150

## 2019-05-12 MED ORDER — SODIUM CHLORIDE 0.9 % IV SOLN
Freq: Once | INTRAVENOUS | Status: AC
  Filled 2019-05-12: qty 250

## 2019-05-12 MED ORDER — POTASSIUM CHLORIDE 2 MEQ/ML IV SOLN
Freq: Once | INTRAVENOUS | Status: AC
  Filled 2019-05-12: qty 10

## 2019-05-12 MED ORDER — PALONOSETRON HCL INJECTION 0.25 MG/5ML
0.2500 mg | Freq: Once | INTRAVENOUS | Status: AC
  Administered 2019-05-12: 0.25 mg via INTRAVENOUS

## 2019-05-12 MED ORDER — DEXAMETHASONE SODIUM PHOSPHATE 10 MG/ML IJ SOLN
INTRAMUSCULAR | Status: AC
  Filled 2019-05-12: qty 1

## 2019-05-12 MED ORDER — SODIUM CHLORIDE 0.9 % IV SOLN
30.0000 mg/m2 | Freq: Once | INTRAVENOUS | Status: AC
  Administered 2019-05-12: 60 mg via INTRAVENOUS
  Filled 2019-05-12: qty 60

## 2019-05-12 NOTE — Patient Instructions (Signed)
[facility] Discharge Instructions for Patients Receiving Chemotherapy  Today you received the following chemotherapy agents Cisplatin  To help prevent nausea and vomiting after your treatment, we encourage you to take your nausea medication as directed  If you develop nausea and vomiting that is not controlled by your nausea medication, call the clinic.   BELOW ARE SYMPTOMS THAT SHOULD BE REPORTED IMMEDIATELY:  *FEVER GREATER THAN 100.5 F  *CHILLS WITH OR WITHOUT FEVER  NAUSEA AND VOMITING THAT IS NOT CONTROLLED WITH YOUR NAUSEA MEDICATION  *UNUSUAL SHORTNESS OF BREATH  *UNUSUAL BRUISING OR BLEEDING  TENDERNESS IN MOUTH AND THROAT WITH OR WITHOUT PRESENCE OF ULCERS  *URINARY PROBLEMS  *BOWEL PROBLEMS  UNUSUAL RASH Items with * indicate a potential emergency and should be followed up as soon as possible.  Feel free to call the clinic should you have any questions or concerns. The clinic phone number is [phone].  Please show the CHEMO ALERT CARD at check-in to the Emergency Department and triage nurse.   

## 2019-05-14 NOTE — Progress Notes (Signed)
  Radiation Oncology         [phone] ________________________________  Name: Tamara Osborne MRN: [id number]  Date: 05/15/2019  DOB: April 28, 1975  SIMULATION AND TREATMENT PLANNING NOTE HDR BRACHYTHERAPY  DIAGNOSIS: Stage II-B squamous cell carcinoma of the cervix  NARRATIVE:  The patient was brought to the [facility].  Identity was confirmed.  All relevant records and images related to the planned course of therapy were reviewed.  The patient freely provided informed written consent to proceed with treatment after reviewing the details related to the planned course of therapy. The consent form was witnessed and verified by the simulation staff.  Then, the patient was set-up in a stable reproducible supine position for radiation therapy.  CT images were obtained.  Surface markings were placed.  The CT images were loaded into the planning software.  Then the target and avoidance structures were contoured.  Treatment planning then occurred.  The radiation prescription was entered and confirmed.   I have requested : Brachytherapy Isodose Plan and Dosimetry Calculations to plan the radiation distribution.    PLAN:  The patient will receive 5.5 Gy in 1 fraction.  Patient will be treated with iridium 192 as the high-dose-rate source.  The tandem ring system will be used to deliver the treatment.  ________________________________   Blair Promise, PhD, MD  This document serves as a record of services personally performed by Gery Pray, MD. It was created on his behalf by Clerance Lav, a trained medical scribe. The creation of this record is based on the scribe's personal observations and the provider's statements to them. This document has been checked and approved by the attending provider.

## 2019-05-14 NOTE — Anesthesia Preprocedure Evaluation (Addendum)
Anesthesia Evaluation  Patient identified by MRN, date of birth, ID band Patient awake    Reviewed: Allergy & Precautions, Patient's Chart, lab work & pertinent test results  History of Anesthesia Complications (+) PONV and history of anesthetic complications  Airway Mallampati: III  TM Distance: <3 FB Neck ROM: Full  Mouth opening: Limited Mouth Opening  Dental  (+) Teeth Intact, Dental Advisory Given   Pulmonary sleep apnea (noncompliant) ,    Pulmonary exam normal        Cardiovascular hypertension, Pt. on medications Normal cardiovascular exam     Neuro/Psych PSYCHIATRIC DISORDERS Anxiety Depression negative neurological ROS     GI/Hepatic negative GI ROS, Neg liver ROS,   Endo/Other   Obesity   Renal/GU CRFRenal diseaseCr 1.38     Musculoskeletal negative musculoskeletal ROS (+)   Abdominal   Peds  Hematology  (+) anemia , Hgb 9.1   Anesthesia Other Findings Covid neg 05/12/19 Porta cath accessed by IV team. Pt is a very difficult IV access.   Reproductive/Obstetrics  Cervical cancer                          Anesthesia Physical Anesthesia Plan  ASA: III  Anesthesia Plan: General   Post-op Pain Management:    Induction: Intravenous  PONV Risk Score and Plan: 4 or greater and Treatment may vary due to age or medical condition, Ondansetron, Midazolam, Dexamethasone and Scopolamine patch - Pre-op  Airway Management Planned: LMA  Additional Equipment: None  Intra-op Plan:   Post-operative Plan: Extubation in OR  Informed Consent: I have reviewed the patients History and Physical, chart, labs and discussed the procedure including the risks, benefits and alternatives for the proposed anesthesia with the patient or authorized representative who has indicated his/her understanding and acceptance.     Dental advisory given  Plan Discussed with: CRNA  Anesthesia Plan  Comments:       Anesthesia Quick Evaluation

## 2019-05-15 ENCOUNTER — Other Ambulatory Visit: Payer: Self-pay

## 2019-05-15 ENCOUNTER — Telehealth: Payer: Self-pay | Admitting: Hematology and Oncology

## 2019-05-15 ENCOUNTER — Encounter (HOSPITAL_BASED_OUTPATIENT_CLINIC_OR_DEPARTMENT_OTHER): Payer: Self-pay | Admitting: Radiation Oncology

## 2019-05-15 ENCOUNTER — Ambulatory Visit (HOSPITAL_BASED_OUTPATIENT_CLINIC_OR_DEPARTMENT_OTHER)
Admission: RE | Admit: 2019-05-15 | Discharge: 2019-05-15 | Disposition: A | Discharge status: Other Acute Inpatient Hospital | Payer: Medicaid Other | Source: Ambulatory Visit | Attending: Radiation Oncology | Admitting: Radiation Oncology

## 2019-05-15 ENCOUNTER — Ambulatory Visit (HOSPITAL_BASED_OUTPATIENT_CLINIC_OR_DEPARTMENT_OTHER): Payer: Medicaid Other | Admitting: Certified Registered"

## 2019-05-15 ENCOUNTER — Ambulatory Visit
Admission: RE | Admit: 2019-05-15 | Discharge: 2019-05-15 | Disposition: A | Discharge status: Home / Self Care | Payer: Medicaid Other | Source: Ambulatory Visit | Attending: Radiation Oncology | Admitting: Radiation Oncology

## 2019-05-15 ENCOUNTER — Encounter (HOSPITAL_BASED_OUTPATIENT_CLINIC_OR_DEPARTMENT_OTHER)
Admission: RE | Disposition: A | Discharge status: Other Acute Inpatient Hospital | Payer: Self-pay | Source: Ambulatory Visit | Attending: Radiation Oncology

## 2019-05-15 ENCOUNTER — Encounter: Payer: Self-pay | Admitting: Oncology

## 2019-05-15 ENCOUNTER — Ambulatory Visit (HOSPITAL_COMMUNITY)
Admission: RE | Admit: 2019-05-15 | Discharge: 2019-05-15 | Disposition: A | Discharge status: Home / Self Care | Payer: Medicaid Other | Source: Ambulatory Visit | Attending: Radiation Oncology | Admitting: Radiation Oncology

## 2019-05-15 ENCOUNTER — Other Ambulatory Visit: Payer: Self-pay | Admitting: Hematology and Oncology

## 2019-05-15 VITALS — BP 162/89 | HR 92 | Temp 97.9°F | Resp 20

## 2019-05-15 DIAGNOSIS — Z5111 Encounter for antineoplastic chemotherapy: Secondary | ICD-10-CM | POA: Diagnosis not present

## 2019-05-15 DIAGNOSIS — F419 Anxiety disorder, unspecified: Secondary | ICD-10-CM | POA: Insufficient documentation

## 2019-05-15 DIAGNOSIS — G4730 Sleep apnea, unspecified: Secondary | ICD-10-CM | POA: Diagnosis not present

## 2019-05-15 DIAGNOSIS — D649 Anemia, unspecified: Secondary | ICD-10-CM | POA: Insufficient documentation

## 2019-05-15 DIAGNOSIS — C539 Malignant neoplasm of cervix uteri, unspecified: Secondary | ICD-10-CM | POA: Insufficient documentation

## 2019-05-15 DIAGNOSIS — Z6837 Body mass index (BMI) 37.0-37.9, adult: Secondary | ICD-10-CM | POA: Insufficient documentation

## 2019-05-15 DIAGNOSIS — Z79899 Other long term (current) drug therapy: Secondary | ICD-10-CM | POA: Insufficient documentation

## 2019-05-15 DIAGNOSIS — E669 Obesity, unspecified: Secondary | ICD-10-CM | POA: Insufficient documentation

## 2019-05-15 DIAGNOSIS — N939 Abnormal uterine and vaginal bleeding, unspecified: Secondary | ICD-10-CM

## 2019-05-15 DIAGNOSIS — D069 Carcinoma in situ of cervix, unspecified: Secondary | ICD-10-CM

## 2019-05-15 DIAGNOSIS — Z9221 Personal history of antineoplastic chemotherapy: Secondary | ICD-10-CM | POA: Insufficient documentation

## 2019-05-15 DIAGNOSIS — I10 Essential (primary) hypertension: Secondary | ICD-10-CM | POA: Insufficient documentation

## 2019-05-15 DIAGNOSIS — Z923 Personal history of irradiation: Secondary | ICD-10-CM | POA: Insufficient documentation

## 2019-05-15 HISTORY — DX: Polyneuropathy, unspecified: G62.9

## 2019-05-15 HISTORY — PX: TANDEM RING INSERTION: SHX6199

## 2019-05-15 HISTORY — PX: OPERATIVE ULTRASOUND: SHX5996

## 2019-05-15 HISTORY — DX: Other specified health status: Z78.9

## 2019-05-15 LAB — TYPE AND SCREEN
ABO/RH(D): A NEG
Antibody Screen: NEGATIVE

## 2019-05-15 LAB — CBC
HCT: 23.4 % — ABNORMAL LOW (ref 36.0–46.0)
Hemoglobin: 7.7 g/dL — ABNORMAL LOW (ref 12.0–15.0)
MCH: 29.5 pg (ref 26.0–34.0)
MCHC: 32.9 g/dL (ref 30.0–36.0)
MCV: 89.7 fL (ref 80.0–100.0)
Platelets: 186 10*3/uL (ref 150–400)
RBC: 2.61 MIL/uL — ABNORMAL LOW (ref 3.87–5.11)
RDW: 26.5 % — ABNORMAL HIGH (ref 11.5–15.5)
WBC: 3.4 10*3/uL — ABNORMAL LOW (ref 4.0–10.5)
nRBC: 0 % (ref 0.0–0.2)

## 2019-05-15 LAB — POCT PREGNANCY, URINE: Preg Test, Ur: NEGATIVE

## 2019-05-15 LAB — POCT HEMOGLOBIN-HEMACUE: Hemoglobin: 7.5 g/dL — ABNORMAL LOW (ref 12.0–15.0)

## 2019-05-15 LAB — PREPARE RBC (CROSSMATCH)

## 2019-05-15 SURGERY — INSERTION, UTERINE TANDEM AND RING OR CYLINDER, FOR BRACHYTHERAPY
Anesthesia: General | Site: Vagina

## 2019-05-15 MED ORDER — OXYCODONE HCL 5 MG/5ML PO SOLN
5.0000 mg | Freq: Once | ORAL | Status: DC | PRN
  Filled 2019-05-15: qty 5

## 2019-05-15 MED ORDER — SCOPOLAMINE 1 MG/3DAYS TD PT72
1.0000 | MEDICATED_PATCH | Freq: Once | TRANSDERMAL | Status: DC
  Administered 2019-05-15: 1.5 mg via TRANSDERMAL
  Filled 2019-05-15: qty 1

## 2019-05-15 MED ORDER — ARTIFICIAL TEARS OPHTHALMIC OINT
TOPICAL_OINTMENT | OPHTHALMIC | Status: AC
  Filled 2019-05-15: qty 3.5

## 2019-05-15 MED ORDER — OXYCODONE HCL 5 MG PO TABS
5.0000 mg | ORAL_TABLET | Freq: Once | ORAL | Status: DC | PRN
  Filled 2019-05-15: qty 1

## 2019-05-15 MED ORDER — KETOROLAC TROMETHAMINE 30 MG/ML IJ SOLN
INTRAMUSCULAR | Status: AC
  Filled 2019-05-15: qty 1

## 2019-05-15 MED ORDER — MIDAZOLAM HCL 2 MG/2ML IJ SOLN
INTRAMUSCULAR | Status: AC
  Filled 2019-05-15: qty 2

## 2019-05-15 MED ORDER — PHENYLEPHRINE HCL (PRESSORS) 10 MG/ML IV SOLN
INTRAVENOUS | Status: DC | PRN
  Administered 2019-05-15: 80 ug via INTRAVENOUS

## 2019-05-15 MED ORDER — FENTANYL CITRATE (PF) 100 MCG/2ML IJ SOLN
25.0000 ug | INTRAMUSCULAR | Status: DC | PRN
  Filled 2019-05-15: qty 1

## 2019-05-15 MED ORDER — LACTATED RINGERS IV SOLN
INTRAVENOUS | Status: DC
  Filled 2019-05-15 (×2): qty 250

## 2019-05-15 MED ORDER — DEXAMETHASONE SODIUM PHOSPHATE 10 MG/ML IJ SOLN
INTRAMUSCULAR | Status: AC
  Filled 2019-05-15: qty 1

## 2019-05-15 MED ORDER — LIDOCAINE 2% (20 MG/ML) 5 ML SYRINGE
INTRAMUSCULAR | Status: DC | PRN
  Administered 2019-05-15: 100 mg via INTRAVENOUS

## 2019-05-15 MED ORDER — FENTANYL CITRATE (PF) 100 MCG/2ML IJ SOLN
INTRAMUSCULAR | Status: AC
  Filled 2019-05-15: qty 2

## 2019-05-15 MED ORDER — PHENYLEPHRINE 40 MCG/ML (10ML) SYRINGE FOR IV PUSH (FOR BLOOD PRESSURE SUPPORT)
PREFILLED_SYRINGE | INTRAVENOUS | Status: AC
  Filled 2019-05-15: qty 10

## 2019-05-15 MED ORDER — SODIUM CHLORIDE 0.9 % IR SOLN
Status: DC | PRN
  Administered 2019-05-15: 1000 mL via INTRAVESICAL

## 2019-05-15 MED ORDER — LACTATED RINGERS IV SOLN
INTRAVENOUS | Status: DC
  Filled 2019-05-15: qty 1000

## 2019-05-15 MED ORDER — PROPOFOL 10 MG/ML IV BOLUS
INTRAVENOUS | Status: DC | PRN
  Administered 2019-05-15: 180 mg via INTRAVENOUS

## 2019-05-15 MED ORDER — ACETAMINOPHEN 500 MG PO TABS
1000.0000 mg | ORAL_TABLET | Freq: Once | ORAL | Status: AC
  Administered 2019-05-15: 1000 mg via ORAL
  Filled 2019-05-15: qty 2

## 2019-05-15 MED ORDER — ONDANSETRON HCL 4 MG/2ML IJ SOLN
INTRAMUSCULAR | Status: AC
  Filled 2019-05-15: qty 2

## 2019-05-15 MED ORDER — KETOROLAC TROMETHAMINE 30 MG/ML IJ SOLN
INTRAMUSCULAR | Status: DC | PRN
  Administered 2019-05-15: 30 mg via INTRAVENOUS

## 2019-05-15 MED ORDER — PROPOFOL 10 MG/ML IV BOLUS
INTRAVENOUS | Status: AC
  Filled 2019-05-15: qty 40

## 2019-05-15 MED ORDER — MIDAZOLAM HCL 2 MG/2ML IJ SOLN
INTRAMUSCULAR | Status: DC | PRN
  Administered 2019-05-15: 1 mg via INTRAVENOUS

## 2019-05-15 MED ORDER — LIDOCAINE 2% (20 MG/ML) 5 ML SYRINGE
INTRAMUSCULAR | Status: AC
  Filled 2019-05-15: qty 5

## 2019-05-15 MED ORDER — SCOPOLAMINE 1 MG/3DAYS TD PT72
MEDICATED_PATCH | TRANSDERMAL | Status: AC
  Filled 2019-05-15: qty 1

## 2019-05-15 MED ORDER — ACETAMINOPHEN 500 MG PO TABS
ORAL_TABLET | ORAL | Status: AC
  Filled 2019-05-15: qty 2

## 2019-05-15 MED ORDER — ONDANSETRON HCL 4 MG/2ML IJ SOLN
INTRAMUSCULAR | Status: DC | PRN
  Administered 2019-05-15: 4 mg via INTRAVENOUS

## 2019-05-15 MED ORDER — FENTANYL CITRATE (PF) 100 MCG/2ML IJ SOLN
INTRAMUSCULAR | Status: DC | PRN
  Administered 2019-05-15: 50 ug via INTRAVENOUS

## 2019-05-15 MED ORDER — DEXAMETHASONE SODIUM PHOSPHATE 10 MG/ML IJ SOLN
INTRAMUSCULAR | Status: DC | PRN
  Administered 2019-05-15: 5 mg via INTRAVENOUS

## 2019-05-15 MED ORDER — PROMETHAZINE HCL 25 MG/ML IJ SOLN
6.2500 mg | INTRAMUSCULAR | Status: DC | PRN
  Filled 2019-05-15: qty 1

## 2019-05-15 SURGICAL SUPPLY — 24 items
BNDG CONFORM 2 STRL LF (GAUZE/BANDAGES/DRESSINGS) IMPLANT
COVER WAND RF STERILE (DRAPES) ×2 IMPLANT
DILATOR CANAL MILEX (MISCELLANEOUS) IMPLANT
DRSG PAD ABDOMINAL 8X10 ST (GAUZE/BANDAGES/DRESSINGS) ×2 IMPLANT
GAUZE 4X4 16PLY RFD (DISPOSABLE) ×2 IMPLANT
GLOVE BIO SURGEON STRL SZ7 (GLOVE) ×2 IMPLANT
GLOVE BIO SURGEON STRL SZ7.5 (GLOVE) ×4 IMPLANT
GLOVE BIOGEL PI IND STRL 7.0 (GLOVE) ×1 IMPLANT
GLOVE BIOGEL PI IND STRL 7.5 (GLOVE) ×1 IMPLANT
GLOVE BIOGEL PI INDICATOR 7.0 (GLOVE) ×1
GLOVE BIOGEL PI INDICATOR 7.5 (GLOVE) ×1
GOWN STRL REUS W/TWL LRG LVL3 (GOWN DISPOSABLE) ×2 IMPLANT
HOLDER FOLEY CATH W/STRAP (MISCELLANEOUS) ×2 IMPLANT
HOVERMATT SINGLE USE (MISCELLANEOUS) ×2 IMPLANT
IV NS 1000ML (IV SOLUTION) ×1
IV NS 1000ML BAXH (IV SOLUTION) ×1 IMPLANT
IV SET EXTENSION GRAVITY 40 LF (IV SETS) ×2 IMPLANT
KIT TURNOVER CYSTO (KITS) ×2 IMPLANT
PACK VAGINAL MINOR WOMEN LF (CUSTOM PROCEDURE TRAY) ×2 IMPLANT
PACKING VAGINAL (PACKING) IMPLANT
PAD OB MATERNITY 4.3X12.25 (PERSONAL CARE ITEMS) IMPLANT
TOWEL OR 17X26 10 PK STRL BLUE (TOWEL DISPOSABLE) ×2 IMPLANT
TRAY FOLEY W/BAG SLVR 14FR (SET/KITS/TRAYS/PACK) ×2 IMPLANT
WATER STERILE IRR 500ML POUR (IV SOLUTION) ×2 IMPLANT

## 2019-05-15 NOTE — Anesthesia Procedure Notes (Addendum)
Procedure Name: LMA Insertion Date/Time: 05/15/2019 7:37 AM Performed by: Wanita Chamberlain, CRNA Pre-anesthesia Checklist: Patient identified, Emergency Drugs available, Suction available and Patient being monitored Patient Re-evaluated:Patient Re-evaluated prior to induction Oxygen Delivery Method: Circle system utilized Preoxygenation: Pre-oxygenation with 100% oxygen Induction Type: IV induction Ventilation: Mask ventilation with difficulty LMA: LMA inserted LMA Size: 4.0 Number of attempts: 1 Placement Confirmation: breath sounds checked- equal and bilateral,  CO2 detector and positive ETCO2 Tube secured with: Tape Dental Injury: Teeth and Oropharynx as per pre-operative assessment  Comments: Pt has a small oral opening w/ limited neck extension.

## 2019-05-15 NOTE — Addendum Note (Signed)
Encounter addended by: Loma Sousa, RN on: 05/15/2019 2:44 PM  Actions taken: Clinical Note Signed, LDA properties accepted

## 2019-05-15 NOTE — Anesthesia Postprocedure Evaluation (Signed)
Anesthesia Post Note  Patient: Tamara Osborne  Procedure(s) Performed: TANDEM RING INSERTION (N/A Vagina ) OPERATIVE ULTRASOUND (N/A Vagina )     Patient location during evaluation: PACU Anesthesia Type: General Level of consciousness: awake and alert and oriented Pain management: pain level controlled Vital Signs Assessment: post-procedure vital signs reviewed and stable Respiratory status: spontaneous breathing, nonlabored ventilation and respiratory function stable Cardiovascular status: blood pressure returned to baseline Postop Assessment: no apparent nausea or vomiting Anesthetic complications: no    Last Vitals:  Vitals:   05/15/19 0830 05/15/19 0845  BP: (!) 160/84 (!) 167/86  Pulse: 90 88  Resp: (!) 21 13  Temp:    SpO2: 100% 99%    Last Pain:  Vitals:   05/15/19 0845  TempSrc:   PainSc: 0-No pain                 Brennan Bailey

## 2019-05-15 NOTE — Transfer of Care (Signed)
Immediate Anesthesia Transfer of Care Note  Patient: Tamara Osborne  Procedure(s) Performed: TANDEM RING INSERTION (N/A Vagina ) OPERATIVE ULTRASOUND (N/A Vagina )  Patient Location: PACU  Anesthesia Type:General  Level of Consciousness: awake, alert , oriented and patient cooperative  Airway & Oxygen Therapy: Patient Spontanous Breathing and Patient connected to nasal cannula oxygen  Post-op Assessment: Report given to RN and Post -op Vital signs reviewed and stable  Post vital signs: Reviewed and stable  Last Vitals:  Vitals Value Taken Time  BP 155/90 05/15/19 0812  Temp    Pulse 88 05/15/19 0813  Resp    SpO2 100 % 05/15/19 0813  Vitals shown include unvalidated device data.  Last Pain:  Vitals:   05/15/19 0701  TempSrc: Oral  PainSc: 0-No pain      Patients Stated Pain Goal: 5 ([id number])  Complications: No apparent anesthesia complications

## 2019-05-15 NOTE — Progress Notes (Signed)
  Radiation Oncology         [phone] ________________________________  Name: Tamara Osborne MRN: [id number]  Date: 05/15/2019  DOB: 04-Aug-1975  CC: [facility]  HDR BRACHYTHERAPY NOTE  DIAGNOSIS: Stage II-B squamous cell carcinoma of the cervix  NARRATIVE: The patient was brought to the HDR suite. Identity was confirmed. All relevant records and images related to the planned course of therapy were reviewed. The patient freely provided informed written consent to proceed with treatment after reviewing the details related to the planned course of therapy. The consent form was witnessed and verified by the simulation staff. Then, the patient was set-up in a stable reproducible supine position for radiation therapy. The tandem ring system was accessed and fiducial markers were placed within the tandem and ring.   Simple treatment device note: On the operating room the patient had construction of her custom tandem ring system. She will be treated with a 45 tandem/ring system. The patient had placement of a 60 mm tandem. A cervical ring with a large shielding cap was used for her treatment. A rectal paddle was also part of her custom set up device.  Verification simulation note: An AP and lateral film was obtained through the pelvis area. This was compared to the patient's planning films documenting accurate position of the tandem/ring system for treatment.  High-dose-rate brachytherapy treatment note:  The remote afterloading device was accessed through catheter system and attached to the tandem ring system. Patient then proceeded to undergo her second high-dose-rate treatment directed at the cervix. The patient was prescribed a dose of 5.5 gray to be delivered to the [place].Marland Kitchen Patient was treated with 2 channels using 23 dwell positions. Treatment time was 656.1 seconds. The patient tolerated the procedure well. After completion of her therapy, a  radiation survey was performed documenting return of the iridium source into the [employer] safe. The patient was then transferred to the nursing suite. She then had removal of the rectal paddle followed by the tandem and ring system. The patient tolerated the removal well.  Minimal bleeding occurred.  PLAN: Patient will return next week for her third high-dose-rate treatment directed at the cervix. ________________________________  Blair Promise, PhD, MD

## 2019-05-15 NOTE — Op Note (Signed)
05/15/2019  8:37 AM  PATIENT:  Tamara Osborne  44 y.o. female  PRE-OPERATIVE DIAGNOSIS:  CERVICAL CANCER  POST-OPERATIVE DIAGNOSIS:  CERVICAL CANCER  PROCEDURE:  Procedure(s): TANDEM RING INSERTION (N/A) OPERATIVE ULTRASOUND (N/A)  SURGEON:  Surgeon(s) and Role:    * Gery Pray, MD - Primary  PHYSICIAN ASSISTANT:   ASSISTANTS: none   ANESTHESIA:   general  EBL:  5 mL   BLOOD ADMINISTERED:none  DRAINS: Urinary Catheter (Foley)   LOCAL MEDICATIONS USED:  NONE  SPECIMEN:  No Specimen  DISPOSITION OF SPECIMEN:  N/A  COUNTS:  YES  TOURNIQUET:  * No tourniquets in log *  DICTATION: the patient's Port-a cath was used for IV access in light of difficulties with IV access last week.  She was taken to outpatient OR #1.  Timeout was performed for the procedure, estimated length of the procedure and preoperative medications.  Patient was prepped and draped in the usual sterile fashion.  Betadine was not used in light of the patient's seafood allergy.  Patient had a Foley catheter placed without difficulty.  Later during the procedure the bladder was backfilled with approximately 200 cc of sterile saline for ultrasound imaging purposes.  Once the patient was asleep,  exam under anesthesia showed the cervical mass to be markedly decreased in size and is estimated to be approximately 3 x 2.5 cm in size.  No parametrial extension noted, no further extension into the anterior vaginal wall.  Patient proceeded to undergo dilation of the cervix.  The uterus sounded to 10.4 cm.  The uterus was mildly anteverted.  Of note was the patient was noted to have a yeast infection along the periurethral area as well as the proximal vagina and the cervical area.  There was some erythema noted to the cervical region but no active bleeding noted.  After dilation of the cervix the patient had a 60 mm cervical sleeve placed within the endocervical canal and endometrial canal.  She then had placement of a 60  mm tandem with a 45 degree orientation.  This was then followed by placement of a 45 degree ring with a large shielding cap in place.  Patient then had placement of a rectal paddle posteriorly.  Excellent images were obtained confirming the placement of the tandem within the central portion of the lower uterus segment and endocervical canal.  Patient tolerated the procedure well.  She was transferred to the recovery room in stable condition.  Later in the day the patient will be transferred to radiation oncology for planning and her second high-dose-rate treatment.  She is to receive a dose of 5.5 Gy to the high risk clinical target volume.  She is to receive 3 additional HDR treatments after this initial one today. Patient's hemoglobin had dropped in light of her chemotherapy late last week. She will be transfused with 2 units of packed red cells later today once in the radiation oncology department.  PLAN OF CARE: Transfer to radiation oncology for planning and treatment  PATIENT DISPOSITION:  PACU - hemodynamically stable.   Delay start of Pharmacological VTE agent (>24hrs) due to surgical blood loss or risk of bleeding: not applicable

## 2019-05-15 NOTE — Progress Notes (Signed)
Notified Tamara Osborne of blood transfusion tomorrow, 05/16/19, after Dr. Calton Dach appointment.  Also advised her that she will not need to come in for labs/flush before tomorrow's appointments.  Discussed how to manage constipation (take MiraLax once a day to start and twice a day if no results and senokot as needed up to 3 times a day).

## 2019-05-15 NOTE — Progress Notes (Signed)
Pt tolerated procedure well. Pt was discharged with port still accessed per pt request as she has transfusion scheduled for tomorrow. This RN alerted infusion room charge RN, Amy. Loma Sousa, RN BSN

## 2019-05-15 NOTE — Interval H&P Note (Signed)
History and Physical Interval Note:  05/15/2019 7:27 AM  Tamara Osborne  has presented today for surgery, with the diagnosis of CERVICAL CANCER.  The various methods of treatment have been discussed with the patient and family. After consideration of risks, benefits and other options for treatment, the patient has consented to  Procedure(s): TANDEM RING INSERTION (N/A) OPERATIVE ULTRASOUND (N/A) as a surgical intervention.  The patient's history has been reviewed, patient examined, no change in status, stable for surgery.  I have reviewed the patient's chart and labs.  Questions were answered to the patient's satisfaction.     Gery Pray

## 2019-05-15 NOTE — Telephone Encounter (Signed)
Scheduled appt per 3/15 sch msg. Patient aware of appt

## 2019-05-16 ENCOUNTER — Inpatient Hospital Stay: Payer: Medicaid Other

## 2019-05-16 ENCOUNTER — Inpatient Hospital Stay (HOSPITAL_BASED_OUTPATIENT_CLINIC_OR_DEPARTMENT_OTHER): Payer: Medicaid Other

## 2019-05-16 ENCOUNTER — Inpatient Hospital Stay (HOSPITAL_BASED_OUTPATIENT_CLINIC_OR_DEPARTMENT_OTHER): Payer: Medicaid Other | Admitting: Hematology and Oncology

## 2019-05-16 ENCOUNTER — Encounter: Payer: Self-pay | Admitting: Hematology and Oncology

## 2019-05-16 ENCOUNTER — Telehealth: Payer: Self-pay | Admitting: Hematology and Oncology

## 2019-05-16 ENCOUNTER — Telehealth: Payer: Self-pay | Admitting: Oncology

## 2019-05-16 ENCOUNTER — Other Ambulatory Visit: Payer: Self-pay

## 2019-05-16 DIAGNOSIS — C539 Malignant neoplasm of cervix uteri, unspecified: Secondary | ICD-10-CM

## 2019-05-16 DIAGNOSIS — G620 Drug-induced polyneuropathy: Secondary | ICD-10-CM

## 2019-05-16 DIAGNOSIS — T451X5A Adverse effect of antineoplastic and immunosuppressive drugs, initial encounter: Secondary | ICD-10-CM | POA: Diagnosis not present

## 2019-05-16 DIAGNOSIS — Z5111 Encounter for antineoplastic chemotherapy: Secondary | ICD-10-CM | POA: Diagnosis not present

## 2019-05-16 DIAGNOSIS — Z9289 Personal history of other medical treatment: Secondary | ICD-10-CM

## 2019-05-16 DIAGNOSIS — N939 Abnormal uterine and vaginal bleeding, unspecified: Secondary | ICD-10-CM | POA: Diagnosis not present

## 2019-05-16 HISTORY — DX: Personal history of other medical treatment: Z92.89

## 2019-05-16 MED ORDER — DIPHENHYDRAMINE HCL 25 MG PO CAPS
25.0000 mg | ORAL_CAPSULE | Freq: Once | ORAL | Status: AC
  Administered 2019-05-16: 25 mg via ORAL

## 2019-05-16 MED ORDER — DIPHENHYDRAMINE HCL 25 MG PO CAPS
ORAL_CAPSULE | ORAL | Status: AC
  Filled 2019-05-16: qty 1

## 2019-05-16 MED ORDER — ACETAMINOPHEN 325 MG PO TABS
ORAL_TABLET | ORAL | Status: AC
  Filled 2019-05-16: qty 2

## 2019-05-16 MED ORDER — ACETAMINOPHEN 325 MG PO TABS
650.0000 mg | ORAL_TABLET | Freq: Once | ORAL | Status: AC
  Administered 2019-05-16: 650 mg via ORAL

## 2019-05-16 MED ORDER — SODIUM CHLORIDE 0.9% IV SOLUTION
250.0000 mL | Freq: Once | INTRAVENOUS | Status: AC
  Administered 2019-05-16: 250 mL via INTRAVENOUS
  Filled 2019-05-16: qty 250

## 2019-05-16 MED ORDER — SODIUM CHLORIDE 0.9% FLUSH
10.0000 mL | INTRAVENOUS | Status: AC | PRN
  Administered 2019-05-16: 10 mL
  Filled 2019-05-16: qty 10

## 2019-05-16 MED ORDER — HEPARIN SOD (PORK) LOCK FLUSH 100 UNIT/ML IV SOLN
500.0000 [IU] | Freq: Every day | INTRAVENOUS | Status: AC | PRN
  Administered 2019-05-16: 15:00:00 500 [IU]
  Filled 2019-05-16: qty 5

## 2019-05-16 NOTE — Progress Notes (Signed)
Pt received 2 units PRBCs today, tolerated well.  VSS.  Able to eat/drink/ambulate to restroom during tx w/out any issues.  Pt verbalized understanding of d/c instructions to f/u as needed and of transfusion/reaction information.

## 2019-05-16 NOTE — Progress Notes (Addendum)
Spoke w/ via phone for pre-op interview---Tamara Osborne needs dos----  Urine poct  Osborne results: ekg 05-09-2019 epic , CBC 05-15-2019 7.7 GETTING 2 UNITS BLOOD TODAY (05-16-2019)          COVID test ------05-18-2019 840 Arrive at -------930 am NPO after ------midnight Medications to take morning of surgery -----clonazepam prn, clonidine prn, lorazepam prn Diabetic medication -----n/a Patient Special Instructions ----- Pre-Op special Istructions ----- Patient verbalized understanding of instructions that were given at this phone interview. Patient denies shortness of breath, chest pain, fever, cough a this phone interview.  PATIENT STATES SHE WAS TOLD BY DR Iona Beard ROSE TO USE PORT A  CATH FOR ALL FUTURE PROCEDURE DUE TO DIFFICULT IV ACCESS.

## 2019-05-16 NOTE — Progress Notes (Signed)
Tamara Osborne OFFICE PROGRESS NOTE  Patient Care Team: [name], [name] as PCP - General (Family Medicine)  ASSESSMENT & PLAN:  Squamous cell carcinoma of cervix ([name]) She completed recent chemotherapy without major side effects except for anemia We will continue to monitor for side effects closely I will see her back in 2 weeks for further follow-up  Abnormal vaginal bleeding We discussed some of the risks, benefits, and alternatives of blood transfusions. The patient is symptomatic from anemia and the hemoglobin level is critically low.  Some of the side-effects to be expected including risks of transfusion reactions, chills, infection, syndrome of volume overload and risk of hospitalization from various reasons and the patient is willing to proceed and went ahead to sign consent today. She will receive blood transfusion today  Peripheral neuropathy due to chemotherapy [name]) she has mild peripheral neuropathy, likely related to side effects of treatment. It is only mild, not bothering the patient. I will observe for now   No orders of the defined types were placed in this encounter.   All questions were answered. The patient knows to call the clinic with any problems, questions or concerns. The total time spent in the appointment was 20 minutes encounter with patients including review of chart and various tests results, discussions about plan of care and coordination of care plan   [name], MD 05/16/2019 4:28 PM  INTERVAL HISTORY: Please see below for problem oriented charting. She returns for further follow-up She was noted to be profoundly anemic yesterday She denies dizziness or chest pain She continues to have vaginal bleeding She has very mild peripheral neuropathy from recent treatment but not severe She has some mild nausea but is feeling better today  SUMMARY OF ONCOLOGIC HISTORY: Oncology History  Squamous cell carcinoma of cervix  ([name])  03/21/2019 Pathology Results   The biopsy is superficial and therefore depth of invasion cannot be  determined.  There is at least carcinoma in-situ.    03/23/2019 Initial Diagnosis   Squamous cell carcinoma of cervix ([name])   03/23/2019 Pathology Results   CERVIX, 11 O CLOCK, BIOPSY:  -  Squamous cell carcinoma, invasive  -  See comment   03/28/2019 PET scan   1. Hypermetabolic cervical mass measures roughly 7.6 by 6.9 by 4.9 cm, compatible with malignancy. No adenopathy or distant metastatic spread identified. 2. Extending cephalad and anterior to the uterine fundus, there is a 450 cubic cm simple fluid density structure without hypermetabolic activity. This may represent a right adnexal cyst (favored), peritoneal inclusion cyst, or (if the patient has a remote history of pancreatitis) a remote pseudocyst. 3. Faint ground density nodularity in both lower lobes which could be from atypical infection or extrinsic allergic alveolitis. Given nodular appearance of some of this ground-glass density, I would recommend follow up chest CT in 3 months time in order to ensure clearance. 4. Moderate cardiomegaly, cause uncertain. The patient also has advanced for age/gender atherosclerosis. Aortic Atherosclerosis (ICD10-I70.0).     04/04/2019 Procedure   Successful placement of a right internal jugular approach power injectable Port-A-Cath. The catheter is ready for immediate use   04/07/2019 -  Chemotherapy   The patient had weekly cisplatin for chemotherapy treatment.       REVIEW OF SYSTEMS:   Constitutional: Denies fevers, chills or abnormal weight loss Eyes: Denies blurriness of vision Ears, nose, mouth, throat, and face: Denies mucositis or sore throat Respiratory: Denies cough, dyspnea or wheezes Cardiovascular: Denies palpitation, chest discomfort  or lower extremity swelling Gastrointestinal:  Denies nausea, heartburn or change in bowel habits Skin: Denies abnormal skin  rashes Lymphatics: Denies new lymphadenopathy or easy bruising Behavioral/Psych: Mood is stable, no new changes  All other systems were reviewed with the patient and are negative.  I have reviewed the past medical history, past surgical history, social history and family history with the patient and they are unchanged from previous note.  ALLERGIES:  is allergic to penicillins; shellfish allergy; and sulfa antibiotics.  MEDICATIONS:  Current Outpatient Medications  Medication Sig Dispense Refill  . amLODipine (NORVASC) 10 MG tablet Take 1 tablet (10 mg total) by mouth daily. (Patient taking differently: Take 10 mg by mouth every evening. ) 90 tablet 0  . cloNIDine (CATAPRES) 0.1 MG tablet Take 1 tablet (0.1 mg total) by mouth 2 (two) times daily as needed (blood pressure >180/110). 50 tablet 0  . diphenhydramine-acetaminophen (TYLENOL PM) 25-500 MG TABS tablet Take 1 tablet by mouth at bedtime as needed.    . fluconazole (DIFLUCAN) 100 MG tablet Take 1 tablet (100 mg total) by mouth daily. Take 2 tabs on day 1 8 tablet 0  . fluticasone (FLONASE) 50 MCG/ACT nasal spray Place 1 spray into both nostrils daily as needed for allergies or rhinitis.    . hydrOXYzine (ATARAX/VISTARIL) 25 MG tablet Take 1 tablet (25 mg total) by mouth every 6 (six) hours as needed for anxiety. 20 tablet 0  . lidocaine-prilocaine (EMLA) cream Apply to affected area once 30 g 3  . loperamide (IMODIUM) 2 MG capsule Take by mouth as needed for diarrhea or loose stools.    Marland Kitchen LORazepam (ATIVAN) 0.5 MG tablet Take 1 tablet (0.5 mg total) by mouth every 8 (eight) hours as needed for anxiety. 30 tablet 0  . magnesium oxide (MAG-OX) 400 (241.3 Mg) MG tablet Take 1 tablet (400 mg total) by mouth 2 (two) times daily. (Patient taking differently: Take 400 mg by mouth daily. ) 60 tablet 1  . prochlorperazine (COMPAZINE) 10 MG tablet Take 1 tablet (10 mg total) by mouth every 6 (six) hours as needed (Nausea or vomiting). 30 tablet 1   . senna-docusate (SENOKOT-S) 8.6-50 MG tablet Take 2 tablets by mouth at bedtime. To prevent constipation, do not take if having loose stools (Patient not taking: Reported on 05/02/2019) 60 tablet 1   No current facility-administered medications for this visit.    PHYSICAL EXAMINATION: ECOG PERFORMANCE STATUS: 1 - Symptomatic but completely ambulatory  Vitals:   05/16/19 1026  BP: (!) 169/80  Pulse: 95  Resp: 18  Temp: 98.5 F (36.9 C)  SpO2: 99%   Filed Weights   05/16/19 1026  Weight: 207 lb 3.2 oz (94 kg)    GENERAL:alert, no distress and comfortable NEURO: alert & oriented x 3 with fluent speech, no focal motor/sensory deficits  LABORATORY DATA:  I have reviewed the data as listed    Component Value Date/Time   NA 139 05/08/2019 0906   K 4.2 05/08/2019 0906   CL 104 05/08/2019 0906   CO2 26 05/08/2019 0906   GLUCOSE 110 (H) 05/08/2019 0906   BUN 29 (H) 05/08/2019 0906   CREATININE 1.38 (H) 05/08/2019 0906   CALCIUM 8.7 (L) 05/08/2019 0906   PROT 6.8 05/08/2019 0906   ALBUMIN 3.6 05/08/2019 0906   AST 15 05/08/2019 0906   ALT 29 05/08/2019 0906   ALKPHOS 92 05/08/2019 0906   BILITOT 0.5 05/08/2019 0906   GFRNONAA 47 (L) 05/08/2019 0906   GFRAA  55 (L) 05/08/2019 0906    No results found for: SPEP, UPEP  Lab Results  Component Value Date   WBC 3.4 (L) 05/15/2019   NEUTROABS 3.8 05/08/2019   HGB 7.7 (L) 05/15/2019   HCT 23.4 (L) 05/15/2019   MCV 89.7 05/15/2019   PLT 186 05/15/2019      Chemistry      Component Value Date/Time   NA 139 05/08/2019 0906   K 4.2 05/08/2019 0906   CL 104 05/08/2019 0906   CO2 26 05/08/2019 0906   BUN 29 (H) 05/08/2019 0906   CREATININE 1.38 (H) 05/08/2019 0906      Component Value Date/Time   CALCIUM 8.7 (L) 05/08/2019 0906   ALKPHOS 92 05/08/2019 0906   AST 15 05/08/2019 0906   ALT 29 05/08/2019 0906   BILITOT 0.5 05/08/2019 0906       RADIOGRAPHIC STUDIES: I have personally reviewed the radiological images  as listed and agreed with the findings in the report. [place] Intraoperative  Result Date: 05/15/2019 CLINICAL DATA:  Ultrasound was provided for use by the ordering physician, and a technical charge was applied by the performing facility.  No radiologist interpretation/professional services rendered.   [place] Intraoperative  Result Date: 05/09/2019 CLINICAL DATA:  Ultrasound was provided for use by the ordering physician, and a technical charge was applied by the performing facility.  No radiologist interpretation/professional services rendered.

## 2019-05-16 NOTE — Progress Notes (Signed)
Counseling intern called patient on 05/16/19 for a scheduled telehealth counseling session. Pt did not answer and CI left a voicemail message. CI also emailed pt with dates and times available in the next week. CI will follow up with pt on 05/17/19 to reschedule.   Art Buff [place] Counseling Intern Voicemail:  [phone]

## 2019-05-16 NOTE — Assessment & Plan Note (Signed)
she has mild peripheral neuropathy, likely related to side effects of treatment. It is only mild, not bothering the patient. I will observe for now 

## 2019-05-16 NOTE — Telephone Encounter (Signed)
Left a message that the tandem and ring procedure has been moved up to 7:30 am on Monday, 05/22/19.

## 2019-05-16 NOTE — Assessment & Plan Note (Signed)
We discussed some of the risks, benefits, and alternatives of blood transfusions. The patient is symptomatic from anemia and the hemoglobin level is critically low.  Some of the side-effects to be expected including risks of transfusion reactions, chills, infection, syndrome of volume overload and risk of hospitalization from various reasons and the patient is willing to proceed and went ahead to sign consent today. She will receive blood transfusion today

## 2019-05-16 NOTE — Patient Instructions (Signed)

## 2019-05-16 NOTE — Telephone Encounter (Signed)
Scheduled per 3/16 sch msg. Called and spoke with pt, confirmed 3/30 appt

## 2019-05-16 NOTE — Assessment & Plan Note (Signed)
She completed recent chemotherapy without major side effects except for anemia We will continue to monitor for side effects closely I will see her back in 2 weeks for further follow-up

## 2019-05-17 LAB — BPAM RBC
Blood Product Expiration Date: 202104122359
Blood Product Expiration Date: 202104122359
ISSUE DATE / TIME: 202103161046
ISSUE DATE / TIME: 202103161046
Unit Type and Rh: 600
Unit Type and Rh: 600

## 2019-05-17 LAB — TYPE AND SCREEN
ABO/RH(D): A NEG
Antibody Screen: NEGATIVE
Unit division: 0
Unit division: 0

## 2019-05-17 NOTE — Progress Notes (Signed)
Counseling intern spoke to pt on 05/17/2019 for a telehealth counseling session via phone. Counseling intern provided space for pt to open up about experiences and responded with empathy, compassion, and normalization of feelings. Pt was in a cheerful mood.   Pt reported feeling exhausted after her last surgery, but was able to have a transfusion and IV fluids on Tuesday.  However, pt reports that mentally she is having a good week as evidenced by her not experiencing anxiety. She suspects this change is a result of her recent surgery going smoothly, learning she is possibly done with chemo, and finding out that at the next 3 surgeries they can access her port instead of having to stick her. Pt reported that she attended 3 support programs and found they are enjoyable and "lifted her spirits". Therefore, she plans to continue attending programs when she can to give her something to do and something to lood forward to. The patient is especially looking forward to physically attending an art program at [employer] in late April. Additionally, the pt reflected on the importance of having a routine and maintaining her independence as much as she can. She expressed that she has accepted that she needs some support and has found that as long as she can feel independent in a few ways, she can cope with this loss.   CI used reflections of feeling to help pt connect with emotions and reflections of content and meaning to increase the pt's awareness and emphasize the pt's progress.   Next Counseling Session: Tuesday, May 23, 2019 at 11:00am via phone  [facility] Counseling Intern Voicemail:  [phone]

## 2019-05-18 ENCOUNTER — Other Ambulatory Visit (HOSPITAL_COMMUNITY)
Admission: RE | Admit: 2019-05-18 | Discharge: 2019-05-18 | Disposition: A | Discharge status: Home / Self Care | Payer: Medicaid Other | Source: Ambulatory Visit | Attending: Radiation Oncology | Admitting: Radiation Oncology

## 2019-05-18 DIAGNOSIS — Z20822 Contact with and (suspected) exposure to covid-19: Secondary | ICD-10-CM | POA: Insufficient documentation

## 2019-05-18 DIAGNOSIS — Z01812 Encounter for preprocedural laboratory examination: Secondary | ICD-10-CM | POA: Insufficient documentation

## 2019-05-18 LAB — SARS CORONAVIRUS 2 (TAT 6-24 HRS): SARS Coronavirus 2: NEGATIVE

## 2019-05-19 ENCOUNTER — Ambulatory Visit: Payer: Self-pay

## 2019-05-19 NOTE — Progress Notes (Signed)
Pt called and she is aware that she will need to be here at 0530 due to schedule change.

## 2019-05-19 NOTE — Anesthesia Preprocedure Evaluation (Addendum)
Anesthesia Evaluation  Patient identified by MRN, date of birth, ID band Patient awake    Reviewed: Allergy & Precautions, NPO status , Patient's Chart, lab work & pertinent test results  History of Anesthesia Complications (+) PONV and history of anesthetic complications  Airway Mallampati: III  TM Distance: >3 FB Neck ROM: Full    Dental no notable dental hx.    Pulmonary sleep apnea (untreated) ,    Pulmonary exam normal        Cardiovascular hypertension, Pt. on medications Normal cardiovascular exam     Neuro/Psych Anxiety Depression negative neurological ROS     GI/Hepatic negative GI ROS, Neg liver ROS,   Endo/Other  BMI 38  Renal/GU Renal InsufficiencyRenal disease  negative genitourinary   Musculoskeletal negative musculoskeletal ROS (+)   Abdominal   Peds  Hematology negative hematology ROS (+)   Anesthesia Other Findings Day of surgery medications reviewed with patient.  Reproductive/Obstetrics Cervical ca                            Anesthesia Physical Anesthesia Plan  ASA: III  Anesthesia Plan: General   Post-op Pain Management:    Induction: Intravenous  PONV Risk Score and Plan: 4 or greater and Treatment may vary due to age or medical condition, Ondansetron, Dexamethasone and Midazolam  Airway Management Planned: LMA  Additional Equipment: None  Intra-op Plan:   Post-operative Plan: Extubation in OR  Informed Consent: I have reviewed the patients History and Physical, chart, labs and discussed the procedure including the risks, benefits and alternatives for the proposed anesthesia with the patient or authorized representative who has indicated his/her understanding and acceptance.     Dental advisory given  Plan Discussed with: CRNA  Anesthesia Plan Comments:        Anesthesia Quick Evaluation

## 2019-05-21 NOTE — Progress Notes (Signed)
  Radiation Oncology         [phone] ________________________________  Name: Tamara Osborne MRN: [id number]  Date: 05/22/2019  DOB: 1975-11-26  CC: [facility]  HDR BRACHYTHERAPY NOTE  DIAGNOSIS: Stage II-B squamous cell carcinoma of the cervix  NARRATIVE: The patient was brought to the HDR suite. Identity was confirmed. All relevant records and images related to the planned course of therapy were reviewed. The patient freely provided informed written consent to proceed with treatment after reviewing the details related to the planned course of therapy. The consent form was witnessed and verified by the simulation staff. Then, the patient was set-up in a stable reproducible supine position for radiation therapy. The tandem ring system was accessed and fiducial markers were placed within the tandem and ring.   Simple treatment device note: On the operating room the patient had construction of her custom tandem ring system. She will be treated with a 45 tandem/ring system. The patient had placement of a 60 mm tandem. A cervical ring with a large shielding was used for her treatment. A rectal paddle was also part of her custom set up device.  Verification simulation note: An AP and lateral film was obtained through the pelvis area. This was compared to the patient's planning films documenting accurate position of the tandem/ring system for treatment.  High-dose-rate brachytherapy treatment note:  The remote afterloading device was accessed through catheter system and attached to the tandem ring system. Patient then proceeded to undergo her third high-dose-rate treatment directed at the cervix. The patient was prescribed a dose of 6.0 Gy to the high risk clinical target volume.. Patient was treated with 2 channels using 26 dwell positions. Treatment time was 798.90 seconds. The patient tolerated the procedure well. After completion of her  therapy, a radiation survey was performed documenting return of the iridium source into the GammaMed safe. The patient was then transferred to the nursing suite. She then had removal of the rectal paddle followed by the tandem and ring system. The patient tolerated the removal well.  PLAN: The patient will return next week for her fourth high-dose-rate treatment. ________________________________     Blair Promise, PhD, MD  This document serves as a record of services personally performed by Gery Pray, MD. It was created on his behalf by Clerance Lav, a trained medical scribe. The creation of this record is based on the scribe's personal observations and the provider's statements to them. This document has been checked and approved by the attending provider.

## 2019-05-21 NOTE — Progress Notes (Signed)
  Radiation Oncology         [phone] ________________________________  Name: Tamara Osborne MRN: [id number]  Date: 05/22/2019  DOB: 1975/03/14  SIMULATION AND TREATMENT PLANNING NOTE HDR BRACHYTHERAPY  DIAGNOSIS: Stage II-B squamous cell carcinoma of the cervix  NARRATIVE:  The patient was brought to the [place].  Identity was confirmed.  All relevant records and images related to the planned course of therapy were reviewed.  The patient freely provided informed written consent to proceed with treatment after reviewing the details related to the planned course of therapy. The consent form was witnessed and verified by the simulation staff.  Then, the patient was set-up in a stable reproducible supine position for radiation therapy.  CT images were obtained.  Surface markings were placed.  The CT images were loaded into the planning software.  Then the target and avoidance structures were contoured.  Treatment planning then occurred.  The radiation prescription was entered and confirmed.   I have requested : Brachytherapy Isodose Plan and Dosimetry Calculations to plan the radiation distribution.    PLAN:  The patient will receive 5.5 Gy in 1 fraction directed at the high risk clinical target volume.  Patient will be treated with iridium 192 is the high-dose-rate source.  The tandem ring system will be used today with a treatment.  ___________________________   Blair Promise, PhD, MD  This document serves as a record of services personally performed by Gery Pray, MD. It was created on his behalf by Clerance Lav, a trained medical scribe. The creation of this record is based on the scribe's personal observations and the provider's statements to them. This document has been checked and approved by the attending provider.

## 2019-05-22 ENCOUNTER — Ambulatory Visit (HOSPITAL_BASED_OUTPATIENT_CLINIC_OR_DEPARTMENT_OTHER)
Admission: RE | Admit: 2019-05-22 | Discharge: 2019-05-22 | Disposition: A | Discharge status: Home / Self Care | Payer: Medicaid Other | Attending: Radiation Oncology | Admitting: Radiation Oncology

## 2019-05-22 ENCOUNTER — Ambulatory Visit
Admission: RE | Admit: 2019-05-22 | Discharge: 2019-05-22 | Disposition: A | Discharge status: Home / Self Care | Payer: Medicaid Other | Source: Ambulatory Visit | Attending: Radiation Oncology | Admitting: Radiation Oncology

## 2019-05-22 ENCOUNTER — Encounter (HOSPITAL_BASED_OUTPATIENT_CLINIC_OR_DEPARTMENT_OTHER)
Admission: RE | Disposition: A | Discharge status: Home / Self Care | Payer: Self-pay | Source: Home / Self Care | Attending: Radiation Oncology

## 2019-05-22 ENCOUNTER — Ambulatory Visit (HOSPITAL_BASED_OUTPATIENT_CLINIC_OR_DEPARTMENT_OTHER): Payer: Medicaid Other | Admitting: Anesthesiology

## 2019-05-22 ENCOUNTER — Ambulatory Visit (HOSPITAL_COMMUNITY)
Admission: RE | Admit: 2019-05-22 | Discharge: 2019-05-22 | Disposition: A | Discharge status: Home / Self Care | Payer: Medicaid Other | Source: Ambulatory Visit | Attending: Radiation Oncology | Admitting: Radiation Oncology

## 2019-05-22 ENCOUNTER — Encounter (HOSPITAL_BASED_OUTPATIENT_CLINIC_OR_DEPARTMENT_OTHER): Payer: Self-pay | Admitting: Radiation Oncology

## 2019-05-22 VITALS — BP 155/93 | HR 88 | Temp 97.7°F | Resp 20

## 2019-05-22 DIAGNOSIS — Z79899 Other long term (current) drug therapy: Secondary | ICD-10-CM | POA: Diagnosis not present

## 2019-05-22 DIAGNOSIS — Z6836 Body mass index (BMI) 36.0-36.9, adult: Secondary | ICD-10-CM | POA: Diagnosis not present

## 2019-05-22 DIAGNOSIS — Z91013 Allergy to seafood: Secondary | ICD-10-CM | POA: Insufficient documentation

## 2019-05-22 DIAGNOSIS — G4730 Sleep apnea, unspecified: Secondary | ICD-10-CM | POA: Insufficient documentation

## 2019-05-22 DIAGNOSIS — Z8249 Family history of ischemic heart disease and other diseases of the circulatory system: Secondary | ICD-10-CM | POA: Insufficient documentation

## 2019-05-22 DIAGNOSIS — F419 Anxiety disorder, unspecified: Secondary | ICD-10-CM | POA: Insufficient documentation

## 2019-05-22 DIAGNOSIS — Z880 Allergy status to penicillin: Secondary | ICD-10-CM | POA: Insufficient documentation

## 2019-05-22 DIAGNOSIS — I10 Essential (primary) hypertension: Secondary | ICD-10-CM | POA: Insufficient documentation

## 2019-05-22 DIAGNOSIS — C539 Malignant neoplasm of cervix uteri, unspecified: Secondary | ICD-10-CM | POA: Diagnosis not present

## 2019-05-22 DIAGNOSIS — Z882 Allergy status to sulfonamides status: Secondary | ICD-10-CM | POA: Diagnosis not present

## 2019-05-22 DIAGNOSIS — E669 Obesity, unspecified: Secondary | ICD-10-CM | POA: Diagnosis not present

## 2019-05-22 DIAGNOSIS — D069 Carcinoma in situ of cervix, unspecified: Secondary | ICD-10-CM

## 2019-05-22 HISTORY — PX: OPERATIVE ULTRASOUND: SHX5996

## 2019-05-22 HISTORY — PX: TANDEM RING INSERTION: SHX6199

## 2019-05-22 LAB — POCT PREGNANCY, URINE: Preg Test, Ur: NEGATIVE

## 2019-05-22 SURGERY — INSERTION, UTERINE TANDEM AND RING OR CYLINDER, FOR BRACHYTHERAPY
Anesthesia: General | Site: Vagina

## 2019-05-22 MED ORDER — MIDAZOLAM HCL 2 MG/2ML IJ SOLN
INTRAMUSCULAR | Status: AC
  Filled 2019-05-22: qty 2

## 2019-05-22 MED ORDER — KETOROLAC TROMETHAMINE 30 MG/ML IJ SOLN
INTRAMUSCULAR | Status: DC | PRN
  Administered 2019-05-22: 30 mg via INTRAVENOUS

## 2019-05-22 MED ORDER — FENTANYL CITRATE (PF) 100 MCG/2ML IJ SOLN
INTRAMUSCULAR | Status: DC | PRN
  Administered 2019-05-22 (×2): 25 ug via INTRAVENOUS
  Administered 2019-05-22: 50 ug via INTRAVENOUS

## 2019-05-22 MED ORDER — LACTATED RINGERS IV SOLN
INTRAVENOUS | Status: DC
  Filled 2019-05-22 (×2): qty 250

## 2019-05-22 MED ORDER — PROPOFOL 10 MG/ML IV BOLUS
INTRAVENOUS | Status: DC | PRN
  Administered 2019-05-22: 180 mg via INTRAVENOUS

## 2019-05-22 MED ORDER — SODIUM CHLORIDE 0.9% FLUSH
10.0000 mL | INTRAVENOUS | Status: DC | PRN
  Filled 2019-05-22: qty 40

## 2019-05-22 MED ORDER — SODIUM CHLORIDE 0.9 % IR SOLN
Status: DC | PRN
  Administered 2019-05-22: 1000 mL via INTRAVESICAL

## 2019-05-22 MED ORDER — CHLORHEXIDINE GLUCONATE CLOTH 2 % EX PADS
6.0000 | MEDICATED_PAD | Freq: Every day | CUTANEOUS | Status: DC
  Filled 2019-05-22: qty 6

## 2019-05-22 MED ORDER — LACTATED RINGERS IV SOLN
INTRAVENOUS | Status: DC
  Filled 2019-05-22 (×2): qty 1000

## 2019-05-22 MED ORDER — OXYCODONE HCL 5 MG/5ML PO SOLN
5.0000 mg | Freq: Once | ORAL | Status: DC | PRN
  Filled 2019-05-22: qty 5

## 2019-05-22 MED ORDER — DEXAMETHASONE SODIUM PHOSPHATE 10 MG/ML IJ SOLN
INTRAMUSCULAR | Status: AC
  Filled 2019-05-22: qty 1

## 2019-05-22 MED ORDER — LIDOCAINE 2% (20 MG/ML) 5 ML SYRINGE
INTRAMUSCULAR | Status: DC | PRN
  Administered 2019-05-22: 100 mg via INTRAVENOUS

## 2019-05-22 MED ORDER — ACETAMINOPHEN 500 MG PO TABS
1000.0000 mg | ORAL_TABLET | Freq: Once | ORAL | Status: AC
  Administered 2019-05-22: 1000 mg via ORAL
  Filled 2019-05-22: qty 2

## 2019-05-22 MED ORDER — LIDOCAINE 2% (20 MG/ML) 5 ML SYRINGE
INTRAMUSCULAR | Status: AC
  Filled 2019-05-22: qty 5

## 2019-05-22 MED ORDER — SODIUM CHLORIDE 0.9% FLUSH
10.0000 mL | Freq: Two times a day (BID) | INTRAVENOUS | Status: DC
  Filled 2019-05-22: qty 40

## 2019-05-22 MED ORDER — FENTANYL CITRATE (PF) 100 MCG/2ML IJ SOLN
INTRAMUSCULAR | Status: AC
  Filled 2019-05-22: qty 2

## 2019-05-22 MED ORDER — ACETAMINOPHEN 500 MG PO TABS
ORAL_TABLET | ORAL | Status: AC
  Filled 2019-05-22: qty 2

## 2019-05-22 MED ORDER — KETOROLAC TROMETHAMINE 30 MG/ML IJ SOLN
INTRAMUSCULAR | Status: AC
  Filled 2019-05-22: qty 1

## 2019-05-22 MED ORDER — OXYCODONE HCL 5 MG PO TABS
5.0000 mg | ORAL_TABLET | Freq: Once | ORAL | Status: DC | PRN
  Filled 2019-05-22: qty 1

## 2019-05-22 MED ORDER — PROMETHAZINE HCL 25 MG/ML IJ SOLN
6.2500 mg | INTRAMUSCULAR | Status: DC | PRN
  Filled 2019-05-22: qty 1

## 2019-05-22 MED ORDER — FENTANYL CITRATE (PF) 100 MCG/2ML IJ SOLN
25.0000 ug | INTRAMUSCULAR | Status: DC | PRN
  Administered 2019-05-22: 25 ug via INTRAVENOUS
  Filled 2019-05-22: qty 1

## 2019-05-22 MED ORDER — DEXAMETHASONE SODIUM PHOSPHATE 4 MG/ML IJ SOLN
INTRAMUSCULAR | Status: DC | PRN
  Administered 2019-05-22: 5 mg via INTRAVENOUS

## 2019-05-22 MED ORDER — ONDANSETRON HCL 4 MG/2ML IJ SOLN
INTRAMUSCULAR | Status: AC
  Filled 2019-05-22: qty 2

## 2019-05-22 MED ORDER — ONDANSETRON HCL 4 MG/2ML IJ SOLN
INTRAMUSCULAR | Status: DC | PRN
  Administered 2019-05-22: 4 mg via INTRAVENOUS

## 2019-05-22 MED ORDER — MIDAZOLAM HCL 5 MG/5ML IJ SOLN
INTRAMUSCULAR | Status: DC | PRN
  Administered 2019-05-22: 2 mg via INTRAVENOUS

## 2019-05-22 MED ORDER — PROPOFOL 10 MG/ML IV BOLUS
INTRAVENOUS | Status: AC
  Filled 2019-05-22: qty 40

## 2019-05-22 SURGICAL SUPPLY — 25 items
BNDG CONFORM 2 STRL LF (GAUZE/BANDAGES/DRESSINGS) IMPLANT
COVER WAND RF STERILE (DRAPES) ×3 IMPLANT
DILATOR CANAL MILEX (MISCELLANEOUS) IMPLANT
DRSG PAD ABDOMINAL 8X10 ST (GAUZE/BANDAGES/DRESSINGS) ×3 IMPLANT
GAUZE 4X4 16PLY RFD (DISPOSABLE) ×3 IMPLANT
GLOVE BIO SURGEON STRL SZ 6.5 (GLOVE) ×2 IMPLANT
GLOVE BIO SURGEON STRL SZ7.5 (GLOVE) ×6 IMPLANT
GLOVE BIO SURGEONS STRL SZ 6.5 (GLOVE) ×1
GLOVE BIOGEL PI IND STRL 6.5 (GLOVE) ×1 IMPLANT
GLOVE BIOGEL PI IND STRL 7.5 (GLOVE) ×1 IMPLANT
GLOVE BIOGEL PI INDICATOR 6.5 (GLOVE) ×2
GLOVE BIOGEL PI INDICATOR 7.5 (GLOVE) ×2
GOWN STRL REUS W/TWL LRG LVL3 (GOWN DISPOSABLE) ×6 IMPLANT
HOLDER FOLEY CATH W/STRAP (MISCELLANEOUS) ×3 IMPLANT
HOVERMATT SINGLE USE (MISCELLANEOUS) ×3 IMPLANT
IV NS 1000ML (IV SOLUTION) ×2
IV NS 1000ML BAXH (IV SOLUTION) ×1 IMPLANT
IV SET EXTENSION GRAVITY 40 LF (IV SETS) ×3 IMPLANT
KIT TURNOVER CYSTO (KITS) ×3 IMPLANT
PACK VAGINAL MINOR WOMEN LF (CUSTOM PROCEDURE TRAY) ×3 IMPLANT
PACKING VAGINAL (PACKING) IMPLANT
PAD OB MATERNITY 4.3X12.25 (PERSONAL CARE ITEMS) IMPLANT
TOWEL OR 17X26 10 PK STRL BLUE (TOWEL DISPOSABLE) ×3 IMPLANT
TRAY FOLEY W/BAG SLVR 14FR (SET/KITS/TRAYS/PACK) ×3 IMPLANT
WATER STERILE IRR 500ML POUR (IV SOLUTION) ×3 IMPLANT

## 2019-05-22 NOTE — Anesthesia Postprocedure Evaluation (Signed)
Anesthesia Post Note  Patient: Tamara Osborne  Procedure(s) Performed: TANDEM RING INSERTION (N/A Vagina ) OPERATIVE ULTRASOUND (N/A Vagina )     Patient location during evaluation: PACU Anesthesia Type: General Level of consciousness: awake and alert and oriented Pain management: pain level controlled Vital Signs Assessment: post-procedure vital signs reviewed and stable Respiratory status: spontaneous breathing, nonlabored ventilation and respiratory function stable Cardiovascular status: blood pressure returned to baseline Postop Assessment: no apparent nausea or vomiting Anesthetic complications: no    Last Vitals:  Vitals:   05/22/19 0845 05/22/19 0900  BP: (!) 159/90 (!) 157/87  Pulse: 83 79  Resp: 13 16  Temp:    SpO2: 97% 95%    Last Pain:  Vitals:   05/22/19 0830  TempSrc:   PainSc: [place]

## 2019-05-22 NOTE — Transfer of Care (Signed)
Immediate Anesthesia Transfer of Care Note  Patient: Tamara Osborne  Procedure(s) Performed: Procedure(s) (LRB): TANDEM RING INSERTION (N/A) OPERATIVE ULTRASOUND (N/A)  Patient Location: PACU  Anesthesia Type: General  Level of Consciousness: awake, sedated, patient cooperative and responds to stimulation  Airway & Oxygen Therapy: Patient Spontanous Breathing and Patient connected to [place] 02 and soft FM   Post-op Assessment: Report given to PACU RN, Post -op Vital signs reviewed and stable and Patient moving all extremities  Post vital signs: Reviewed and stable  Complications: No apparent anesthesia complications

## 2019-05-22 NOTE — Anesthesia Procedure Notes (Signed)
Procedure Name: LMA Insertion Date/Time: 05/22/2019 7:45 AM Performed by: Justice Rocher, CRNA Pre-anesthesia Checklist: Patient identified, Emergency Drugs available, Suction available, Patient being monitored and Timeout performed Patient Re-evaluated:Patient Re-evaluated prior to induction Oxygen Delivery Method: Circle system utilized Preoxygenation: Pre-oxygenation with 100% oxygen Induction Type: IV induction Ventilation: Mask ventilation without difficulty LMA: LMA inserted LMA Size: 4.0 Number of attempts: 1 Airway Equipment and Method: Bite block Placement Confirmation: positive ETCO2,  breath sounds checked- equal and bilateral and CO2 detector Tube secured with: Tape Dental Injury: Teeth and Oropharynx as per pre-operative assessment

## 2019-05-22 NOTE — Interval H&P Note (Signed)
History and Physical Interval Note:  05/22/2019 7:32 AM  Tamara Osborne  has presented today for surgery, with the diagnosis of CERVICAL CANCER.  The various methods of treatment have been discussed with the patient and family. After consideration of risks, benefits and other options for treatment, the patient has consented to  Procedure(s): TANDEM RING INSERTION (N/A) OPERATIVE ULTRASOUND (N/A) as a surgical intervention.  The patient's history has been reviewed, patient examined, no change in status, stable for surgery.  I have reviewed the patient's chart and labs.  Questions were answered to the patient's satisfaction.     Gery Pray

## 2019-05-22 NOTE — Op Note (Signed)
05/22/2019  8:33 AM  PATIENT:  Tamara Osborne  44 y.o. female  PRE-OPERATIVE DIAGNOSIS:  CERVICAL CANCER  POST-OPERATIVE DIAGNOSIS:  CERVICAL CANCER  PROCEDURE:  Procedure(s): TANDEM RING INSERTION (N/A) OPERATIVE ULTRASOUND (N/A)  SURGEON:  Surgeon(s) and Role:    * Gery Pray, MD - Primary  PHYSICIAN ASSISTANT:   ASSISTANTS: none   ANESTHESIA:   general  EBL:  0 mL   BLOOD ADMINISTERED:none  DRAINS: Urinary Catheter (Foley)   LOCAL MEDICATIONS USED:  NONE  SPECIMEN:  No Specimen  DISPOSITION OF SPECIMEN:  N/A  COUNTS:  YES  TOURNIQUET:  * No tourniquets in log *  DICTATION: the patient's Port-a cath was used for IV access in light of difficulties with IV access the first brachytherapy treatment.  She was taken to outpatientOR #3. Timeout was performed for the procedure, estimated length of the procedure and preoperative medications. Patient was prepped and draped in the usual sterile fashion. Betadine was not used in light of the patient's seafood allergy. Patient had a Foley catheter placed without difficulty. Later during the procedure the bladder was backfilled with approximately 200 cc of sterile saline for ultrasound imaging purposes. Once the patient was asleep,exam under anesthesia showed the cervical mass to be markedly decreased in size and is estimated to be approximately 2.5 x 2.5 cm in size. No parametrial extension noted, no further extension into the anterior vaginal wall. Patient proceeded to undergo dilation of the cervix. The uterus sounded to ~ 10.0 cm. The uterus was mildly anteverted. There was some erythema noted to the cervical region but no active bleeding noted. After dilation of the cervix the patient had a 60 mm cervical sleeve placed within the endocervical canal and endometrial canal. She then had placement of a 60 mm tandem with a 45 degree orientation. This was then followed by placement of a 45 degree ring with a large  shielding cap in place. Patient then had placement of a rectal paddle posteriorly. Excellent images were obtained confirming the placement of the tandem within the central portion of the lower uterus segmentand endocervical canal. Patient tolerated the procedure well. She was transferred to the recovery room in stable condition. Later in the day the patient will be transferred to radiation oncology for planning and her third high-dose-rate treatment. She is to receive a dose of 5.5 Gy to the high risk clinical target volume. She is to receive 2 additional HDR treatments after this one today.    PLAN OF CARE: Transferred to radiation oncology for planning and treatment  PATIENT DISPOSITION:  PACU - hemodynamically stable.   Delay start of Pharmacological VTE agent (>24hrs) due to surgical blood loss or risk of bleeding: not applicable

## 2019-05-23 ENCOUNTER — Other Ambulatory Visit: Payer: Self-pay

## 2019-05-23 NOTE — Progress Notes (Signed)
Counseling intern spoke to patient on 05/23/2019 for a telehealth counseling session via phone. Counseling intern provided space for pt to open up about experiences and responded with empathy, compassion, and normalization of feelings. Pt presented to counseling with a positive and calm demeanor and affect.  Pt reported that her surgery yesterday went smoothly and that she experienced no anxiety. Pt attributes this to several reasons including that this being her 3rd surgery she knows what to expect and believes she can handle it and that in using the port she is not distressed about being pricked again and again. Pt recognized that she is over half way done with her surgeries, with only 2 remaining. Pt also reported that her involvement in online support programing is beneficial to her at this time and she looks forward to these opportunities because they "lift her spirits". Pt stated she finds the groups provide a positive distraction, connection to others with cancer, normalization of her experience, encouragement to try out something new, and modeling (hearing what others are doing to cope and feel better). Pt reported she is able to do more around the house now such as going up the stairs safely and making herself lunch.   CI pointed out to pt that she is doing the skill of noticing what works to help herself feel better and then following through with doing more of this by continuing to sign up for the support programming. CI and pt also discussed being aware of small ways the pt is able to have control in her life and how putting some of her focus on these moments will help her feel more empowered and balanced.   Next Counseling Session: Tuesday, May 30, 2019 at noon via phone  [facility] Counseling Intern Voicemail:  [phone]

## 2019-05-23 NOTE — Progress Notes (Addendum)
Spoke w/ via phone for pre-op interview---[facility] needs dos---- urine poct             Lab results------ekg 05-09-2019 epic COVID test ------05-25-2019 at 83 am Arrive at -------530 am 05-29-2019 NPO after ------midnight Medications to take morning of surgery -----clonazepam prn, clonidine prn, lorazepam prn Diabetic medication -----n/a Patient Special Instructions ----- Pre-Op special Istructions ----- Patient verbalized understanding of instructions that were given at this phone interview. Patient denies shortness of breath, chest pain, fever, cough a this phone interview.  Use port a cath for procedure per dr Iona Beard rose

## 2019-05-25 ENCOUNTER — Other Ambulatory Visit (HOSPITAL_COMMUNITY)
Admit: 2019-05-25 | Discharge: 2019-05-25 | Disposition: A | Discharge status: Home / Self Care | Payer: Medicaid Other | Source: Ambulatory Visit | Attending: Radiation Oncology | Admitting: Radiation Oncology

## 2019-05-25 DIAGNOSIS — Z20822 Contact with and (suspected) exposure to covid-19: Secondary | ICD-10-CM | POA: Diagnosis not present

## 2019-05-25 DIAGNOSIS — Z01812 Encounter for preprocedural laboratory examination: Secondary | ICD-10-CM | POA: Insufficient documentation

## 2019-05-25 LAB — SARS CORONAVIRUS 2 (TAT 6-24 HRS): SARS Coronavirus 2: NEGATIVE

## 2019-05-26 ENCOUNTER — Ambulatory Visit: Payer: Self-pay

## 2019-05-28 NOTE — Progress Notes (Signed)
  Radiation Oncology         [phone] ________________________________  Name: EMMAGENE SESTER MRN: [id number]  Date: 05/29/2019  DOB: December 15, 1975  SIMULATION AND TREATMENT PLANNING NOTE HDR BRACHYTHERAPY  DIAGNOSIS: Stage II-B squamous cell carcinoma of the cervix  NARRATIVE:  The patient was brought to the [place].  Identity was confirmed.  All relevant records and images related to the planned course of therapy were reviewed.  The patient freely provided informed written consent to proceed with treatment after reviewing the details related to the planned course of therapy. The consent form was witnessed and verified by the simulation staff.  Then, the patient was set-up in a stable reproducible  supine position for radiation therapy.  CT images were obtained.  Surface markings were placed.  The CT images were loaded into the planning software.  Then the target and avoidance structures were contoured.  Treatment planning then occurred.  The radiation prescription was entered and confirmed.   I have requested : Brachytherapy Isodose Plan and Dosimetry Calculations to plan the radiation distribution.    PLAN:  The patient will receive 5.5 Gy in 1 fraction directed at the high risk clinical target volume.  Patient will be treated with iridium 192 is the high-dose-rate source.  The tandem ring system will be used today for treatment.  ________________________________   Blair Promise, PhD, MD  This document serves as a record of services personally performed by Gery Pray, MD. It was created on his behalf by Clerance Lav, a trained medical scribe. The creation of this record is based on the scribe's personal observations and the provider's statements to them. This document has been checked and approved by the attending provider.

## 2019-05-28 NOTE — Progress Notes (Signed)
  Radiation Oncology         [phone] ________________________________  Name: Tamara Osborne MRN: [id number]  Date: 05/29/2019  DOB: 1975-06-06  CC: [facility]  HDR BRACHYTHERAPY NOTE  DIAGNOSIS: Stage II-B squamous cell carcinoma of the cervix  NARRATIVE: The patient was brought to the HDR suite. Identity was confirmed. All relevant records and images related to the planned course of therapy were reviewed. The patient freely provided informed written consent to proceed with treatment after reviewing the details related to the planned course of therapy. The consent form was witnessed and verified by the simulation staff. Then, the patient was set-up in a stable reproducible supine position for radiation therapy. The tandem ring system was accessed and fiducial markers were placed within the tandem and ring.   Simple treatment device note: On the operating room the patient had construction of her custom tandem ring system. She will be treated with a 45 tandem/ring system. The patient had placement of a 60 mm tandem. A cervical ring with a large shielding was used for her treatment. A rectal paddle was also part of her custom set up device.  Verification simulation note: An AP and lateral film was obtained through the pelvis area. This was compared to the patient's planning films documenting accurate position of the tandem/ring system for treatment.  High-dose-rate brachytherapy treatment note:  The remote afterloading device was accessed through catheter system and attached to the tandem ring system. Patient then proceeded to undergo her fourth high-dose-rate treatment directed at the cervix. The patient was prescribed a dose of 5.5 Gy to the high risk clinical target volume.. Patient was treated with 2 channels using 26 dwell positions. Treatment time was 746.2 seconds. The patient tolerated the procedure well. After completion of her  therapy, a radiation survey was performed documenting return of the iridium source into the GammaMed safe. The patient was then transferred to the nursing suite. She then had removal of the rectal paddle followed by the tandem and ring system. The patient tolerated the removal well.  PLAN: The patient will return next week for her fifth high-dose-rate treatment. ________________________________     Blair Promise, PhD, MD  This document serves as a record of services personally performed by Gery Pray, MD. It was created on his behalf by Clerance Lav, a trained medical scribe. The creation of this record is based on the scribe's personal observations and the provider's statements to them. This document has been checked and approved by the attending provider.

## 2019-05-28 NOTE — Anesthesia Preprocedure Evaluation (Addendum)
Anesthesia Evaluation  Patient identified by MRN, date of birth, ID band Patient awake    Reviewed: Allergy & Precautions, NPO status , Patient's Chart, lab work & pertinent test results  History of Anesthesia Complications (+) PONV, DIFFICULT IV STICK / SPECIAL LINE and history of anesthetic complications  Airway Mallampati: III  TM Distance: >3 FB Neck ROM: Full  Mouth opening: Limited Mouth Opening  Dental no notable dental hx. (+) Teeth Intact, Dental Advisory Given   Pulmonary sleep apnea (untreated) ,  No CPAP yet, has been unable to afford one   Pulmonary exam normal        Cardiovascular hypertension, Pt. on medications Normal cardiovascular exam Rhythm:Regular Rate:Normal     Neuro/Psych PSYCHIATRIC DISORDERS Anxiety Depression negative neurological ROS     GI/Hepatic negative GI ROS, Neg liver ROS,   Endo/Other  Obesity BMI 37  Renal/GU Renal InsufficiencyRenal disease  Female GU complaint Cervical ca    Musculoskeletal negative musculoskeletal ROS (+)   Abdominal Normal abdominal exam  (+) + obese,   Peds  Hematology  (+) Blood dyscrasia, anemia , S/p 2 units pRBC at cancer center for hct 23.4 on 05/16/19  Most recent H/H this AM 8.3/24.7   Anesthesia Other Findings   Reproductive/Obstetrics negative OB ROS                           Anesthesia Physical Anesthesia Plan  ASA: III  Anesthesia Plan: General   Post-op Pain Management:    Induction: Intravenous  PONV Risk Score and Plan: 4 or greater and Ondansetron, Dexamethasone, Midazolam, Scopolamine patch - Pre-op and Treatment may vary due to age or medical condition  Airway Management Planned: LMA  Additional Equipment: None  Intra-op Plan:   Post-operative Plan: Extubation in OR  Informed Consent: I have reviewed the patients History and Physical, chart, labs and discussed the procedure including the risks,  benefits and alternatives for the proposed anesthesia with the patient or authorized representative who has indicated his/her understanding and acceptance.     Dental advisory given  Plan Discussed with: CRNA  Anesthesia Plan Comments:        Anesthesia Quick Evaluation

## 2019-05-29 ENCOUNTER — Ambulatory Visit (HOSPITAL_COMMUNITY)
Admission: RE | Admit: 2019-05-29 | Discharge: 2019-05-29 | Disposition: A | Discharge status: Home / Self Care | Payer: Medicaid Other | Source: Ambulatory Visit | Attending: Radiation Oncology | Admitting: Radiation Oncology

## 2019-05-29 ENCOUNTER — Ambulatory Visit (HOSPITAL_BASED_OUTPATIENT_CLINIC_OR_DEPARTMENT_OTHER): Payer: Medicaid Other | Admitting: Anesthesiology

## 2019-05-29 ENCOUNTER — Ambulatory Visit (HOSPITAL_BASED_OUTPATIENT_CLINIC_OR_DEPARTMENT_OTHER)
Admission: RE | Admit: 2019-05-29 | Discharge: 2019-05-29 | Disposition: A | Discharge status: Home / Self Care | Payer: Medicaid Other | Source: Ambulatory Visit | Attending: Radiation Oncology | Admitting: Radiation Oncology

## 2019-05-29 ENCOUNTER — Encounter (HOSPITAL_BASED_OUTPATIENT_CLINIC_OR_DEPARTMENT_OTHER): Payer: Self-pay | Admitting: Radiation Oncology

## 2019-05-29 ENCOUNTER — Ambulatory Visit
Admission: RE | Admit: 2019-05-29 | Discharge: 2019-05-29 | Disposition: A | Discharge status: Home / Self Care | Payer: Medicaid Other | Source: Ambulatory Visit | Attending: Radiation Oncology | Admitting: Radiation Oncology

## 2019-05-29 ENCOUNTER — Encounter (HOSPITAL_BASED_OUTPATIENT_CLINIC_OR_DEPARTMENT_OTHER)
Admission: RE | Disposition: A | Discharge status: Home / Self Care | Payer: Self-pay | Source: Ambulatory Visit | Attending: Radiation Oncology

## 2019-05-29 ENCOUNTER — Other Ambulatory Visit: Payer: Self-pay

## 2019-05-29 DIAGNOSIS — C539 Malignant neoplasm of cervix uteri, unspecified: Secondary | ICD-10-CM

## 2019-05-29 DIAGNOSIS — I10 Essential (primary) hypertension: Secondary | ICD-10-CM | POA: Insufficient documentation

## 2019-05-29 DIAGNOSIS — E669 Obesity, unspecified: Secondary | ICD-10-CM | POA: Insufficient documentation

## 2019-05-29 DIAGNOSIS — Z882 Allergy status to sulfonamides status: Secondary | ICD-10-CM | POA: Diagnosis not present

## 2019-05-29 DIAGNOSIS — Z880 Allergy status to penicillin: Secondary | ICD-10-CM | POA: Diagnosis not present

## 2019-05-29 DIAGNOSIS — G4733 Obstructive sleep apnea (adult) (pediatric): Secondary | ICD-10-CM | POA: Diagnosis not present

## 2019-05-29 DIAGNOSIS — Z91013 Allergy to seafood: Secondary | ICD-10-CM | POA: Insufficient documentation

## 2019-05-29 DIAGNOSIS — Z79899 Other long term (current) drug therapy: Secondary | ICD-10-CM | POA: Diagnosis not present

## 2019-05-29 DIAGNOSIS — Z6837 Body mass index (BMI) 37.0-37.9, adult: Secondary | ICD-10-CM | POA: Diagnosis not present

## 2019-05-29 DIAGNOSIS — F419 Anxiety disorder, unspecified: Secondary | ICD-10-CM | POA: Insufficient documentation

## 2019-05-29 HISTORY — PX: OPERATIVE ULTRASOUND: SHX5996

## 2019-05-29 HISTORY — PX: TANDEM RING INSERTION: SHX6199

## 2019-05-29 LAB — CBC
HCT: 24.7 % — ABNORMAL LOW (ref 36.0–46.0)
Hemoglobin: 8.3 g/dL — ABNORMAL LOW (ref 12.0–15.0)
MCH: 31.2 pg (ref 26.0–34.0)
MCHC: 33.6 g/dL (ref 30.0–36.0)
MCV: 92.9 fL (ref 80.0–100.0)
Platelets: 279 10*3/uL (ref 150–400)
RBC: 2.66 MIL/uL — ABNORMAL LOW (ref 3.87–5.11)
RDW: 25.3 % — ABNORMAL HIGH (ref 11.5–15.5)
WBC: 3.1 10*3/uL — ABNORMAL LOW (ref 4.0–10.5)
nRBC: 0 % (ref 0.0–0.2)

## 2019-05-29 SURGERY — INSERTION, UTERINE TANDEM AND RING OR CYLINDER, FOR BRACHYTHERAPY
Anesthesia: General | Site: Vagina

## 2019-05-29 MED ORDER — PROPOFOL 10 MG/ML IV BOLUS
INTRAVENOUS | Status: DC | PRN
  Administered 2019-05-29: 200 mg via INTRAVENOUS

## 2019-05-29 MED ORDER — HYDROMORPHONE HCL 1 MG/ML IJ SOLN
INTRAMUSCULAR | Status: AC
  Filled 2019-05-29: qty 1

## 2019-05-29 MED ORDER — LACTATED RINGERS IV SOLN
Freq: Once | INTRAVENOUS | Status: DC
  Filled 2019-05-29: qty 250

## 2019-05-29 MED ORDER — OXYCODONE HCL 5 MG/5ML PO SOLN
5.0000 mg | Freq: Once | ORAL | Status: DC | PRN
  Filled 2019-05-29: qty 5

## 2019-05-29 MED ORDER — PROPOFOL 10 MG/ML IV BOLUS
INTRAVENOUS | Status: AC
  Filled 2019-05-29: qty 40

## 2019-05-29 MED ORDER — ACETAMINOPHEN 500 MG PO TABS
ORAL_TABLET | ORAL | Status: AC
  Filled 2019-05-29: qty 2

## 2019-05-29 MED ORDER — KETOROLAC TROMETHAMINE 30 MG/ML IJ SOLN
30.0000 mg | Freq: Once | INTRAMUSCULAR | Status: AC | PRN
  Filled 2019-05-29: qty 1

## 2019-05-29 MED ORDER — MIDAZOLAM HCL 5 MG/5ML IJ SOLN
INTRAMUSCULAR | Status: DC | PRN
  Administered 2019-05-29: 2 mg via INTRAVENOUS

## 2019-05-29 MED ORDER — SCOPOLAMINE 1 MG/3DAYS TD PT72
1.0000 | MEDICATED_PATCH | TRANSDERMAL | Status: DC
  Administered 2019-05-29: 1.5 mg via TRANSDERMAL
  Filled 2019-05-29: qty 1

## 2019-05-29 MED ORDER — DEXAMETHASONE SODIUM PHOSPHATE 10 MG/ML IJ SOLN
INTRAMUSCULAR | Status: AC
  Filled 2019-05-29: qty 1

## 2019-05-29 MED ORDER — ONDANSETRON HCL 4 MG/2ML IJ SOLN
INTRAMUSCULAR | Status: DC | PRN
  Administered 2019-05-29: 4 mg via INTRAVENOUS

## 2019-05-29 MED ORDER — LACTATED RINGERS IV SOLN
INTRAVENOUS | Status: DC
  Administered 2019-05-29: 50 mL/h via INTRAVENOUS
  Filled 2019-05-29 (×2): qty 1000

## 2019-05-29 MED ORDER — SODIUM CHLORIDE 0.9 % IR SOLN
Status: DC | PRN
  Administered 2019-05-29: 1000 mL

## 2019-05-29 MED ORDER — HEPARIN SOD (PORK) LOCK FLUSH 100 UNIT/ML IV SOLN
500.0000 [IU] | Freq: Once | INTRAVENOUS | Status: AC
  Administered 2019-05-29: 500 [IU] via INTRAVENOUS

## 2019-05-29 MED ORDER — SODIUM CHLORIDE 0.9% FLUSH
10.0000 mL | Freq: Once | INTRAVENOUS | Status: AC
  Administered 2019-05-29: 17:00:00 10 mL via INTRAVENOUS

## 2019-05-29 MED ORDER — MEPERIDINE HCL 25 MG/ML IJ SOLN
6.2500 mg | INTRAMUSCULAR | Status: DC | PRN
  Filled 2019-05-29: qty 1

## 2019-05-29 MED ORDER — OXYCODONE HCL 5 MG PO TABS
5.0000 mg | ORAL_TABLET | Freq: Once | ORAL | Status: DC | PRN
  Filled 2019-05-29: qty 1

## 2019-05-29 MED ORDER — SCOPOLAMINE 1 MG/3DAYS TD PT72
MEDICATED_PATCH | TRANSDERMAL | Status: AC
  Filled 2019-05-29: qty 1

## 2019-05-29 MED ORDER — KETOROLAC TROMETHAMINE 30 MG/ML IJ SOLN
INTRAMUSCULAR | Status: DC | PRN
  Administered 2019-05-29: 30 mg via INTRAVENOUS

## 2019-05-29 MED ORDER — LIDOCAINE HCL (CARDIAC) PF 100 MG/5ML IV SOSY
PREFILLED_SYRINGE | INTRAVENOUS | Status: DC | PRN
  Administered 2019-05-29: 20 mg via INTRAVENOUS

## 2019-05-29 MED ORDER — PROMETHAZINE HCL 25 MG/ML IJ SOLN
6.2500 mg | INTRAMUSCULAR | Status: DC | PRN
  Filled 2019-05-29: qty 1

## 2019-05-29 MED ORDER — HYDROMORPHONE HCL 1 MG/ML IJ SOLN
0.2500 mg | INTRAMUSCULAR | Status: DC | PRN
  Administered 2019-05-29 (×2): 0.25 mg via INTRAVENOUS
  Filled 2019-05-29: qty 0.5

## 2019-05-29 MED ORDER — SODIUM CHLORIDE 0.9% FLUSH
10.0000 mL | INTRAVENOUS | Status: DC | PRN
  Filled 2019-05-29: qty 40

## 2019-05-29 MED ORDER — KETOROLAC TROMETHAMINE 30 MG/ML IJ SOLN
INTRAMUSCULAR | Status: AC
  Filled 2019-05-29: qty 1

## 2019-05-29 MED ORDER — ONDANSETRON HCL 4 MG/2ML IJ SOLN
INTRAMUSCULAR | Status: AC
  Filled 2019-05-29: qty 2

## 2019-05-29 MED ORDER — CHLORHEXIDINE GLUCONATE CLOTH 2 % EX PADS
6.0000 | MEDICATED_PAD | Freq: Every day | CUTANEOUS | Status: DC
  Filled 2019-05-29: qty 6

## 2019-05-29 MED ORDER — FENTANYL CITRATE (PF) 100 MCG/2ML IJ SOLN
INTRAMUSCULAR | Status: DC | PRN
  Administered 2019-05-29 (×4): 25 ug via INTRAVENOUS

## 2019-05-29 MED ORDER — DEXAMETHASONE SODIUM PHOSPHATE 4 MG/ML IJ SOLN
INTRAMUSCULAR | Status: DC | PRN
  Administered 2019-05-29: 10 mg via INTRAVENOUS

## 2019-05-29 MED ORDER — MIDAZOLAM HCL 2 MG/2ML IJ SOLN
INTRAMUSCULAR | Status: AC
  Filled 2019-05-29: qty 2

## 2019-05-29 MED ORDER — ACETAMINOPHEN 500 MG PO TABS
1000.0000 mg | ORAL_TABLET | Freq: Once | ORAL | Status: AC
  Administered 2019-05-29: 1000 mg via ORAL
  Filled 2019-05-29: qty 2

## 2019-05-29 MED ORDER — LIDOCAINE 2% (20 MG/ML) 5 ML SYRINGE
INTRAMUSCULAR | Status: AC
  Filled 2019-05-29: qty 5

## 2019-05-29 MED ORDER — FENTANYL CITRATE (PF) 100 MCG/2ML IJ SOLN
INTRAMUSCULAR | Status: AC
  Filled 2019-05-29: qty 2

## 2019-05-29 SURGICAL SUPPLY — 19 items
BNDG CONFORM 2 STRL LF (GAUZE/BANDAGES/DRESSINGS) IMPLANT
COVER WAND RF STERILE (DRAPES) ×4 IMPLANT
DILATOR CANAL MILEX (MISCELLANEOUS) IMPLANT
DRSG PAD ABDOMINAL 8X10 ST (GAUZE/BANDAGES/DRESSINGS) ×4 IMPLANT
GAUZE 4X4 16PLY RFD (DISPOSABLE) ×4 IMPLANT
GLOVE BIO SURGEON STRL SZ7.5 (GLOVE) ×12 IMPLANT
GOWN STRL REUS W/TWL LRG LVL3 (GOWN DISPOSABLE) ×8 IMPLANT
HOLDER FOLEY CATH W/STRAP (MISCELLANEOUS) ×4 IMPLANT
HOVERMATT SINGLE USE (MISCELLANEOUS) ×4 IMPLANT
IV NS 1000ML (IV SOLUTION) ×2
IV NS 1000ML BAXH (IV SOLUTION) ×2 IMPLANT
IV SET EXTENSION GRAVITY 40 LF (IV SETS) ×4 IMPLANT
KIT TURNOVER CYSTO (KITS) ×4 IMPLANT
PACK VAGINAL MINOR WOMEN LF (CUSTOM PROCEDURE TRAY) ×4 IMPLANT
PACKING VAGINAL (PACKING) IMPLANT
PAD OB MATERNITY 4.3X12.25 (PERSONAL CARE ITEMS) IMPLANT
TOWEL OR 17X26 10 PK STRL BLUE (TOWEL DISPOSABLE) ×4 IMPLANT
TRAY FOLEY W/BAG SLVR 14FR (SET/KITS/TRAYS/PACK) ×4 IMPLANT
WATER STERILE IRR 500ML POUR (IV SOLUTION) ×4 IMPLANT

## 2019-05-29 NOTE — Interval H&P Note (Signed)
History and Physical Interval Note:  05/29/2019 7:27 AM  Tamara Osborne  has presented today for surgery, with the diagnosis of CERVICAL CANCER.  The various methods of treatment have been discussed with the patient and family. After consideration of risks, benefits and other options for treatment, the patient has consented to  Procedure(s): TANDEM RING INSERTION (N/A) OPERATIVE ULTRASOUND (N/A) as a surgical intervention.  The patient's history has been reviewed, patient examined, no change in status, stable for surgery.  I have reviewed the patient's chart and labs.  Questions were answered to the patient's satisfaction.     Gery Pray

## 2019-05-29 NOTE — Progress Notes (Signed)
Per Dr Raliegh Ip inard patient does not need pregnancy test

## 2019-05-29 NOTE — Anesthesia Procedure Notes (Signed)
Procedure Name: LMA Insertion Date/Time: 05/29/2019 7:36 AM Performed by: Justice Rocher, CRNA Pre-anesthesia Checklist: Patient identified, Emergency Drugs available, Suction available and Patient being monitored Patient Re-evaluated:Patient Re-evaluated prior to induction Oxygen Delivery Method: Circle system utilized Preoxygenation: Pre-oxygenation with 100% oxygen Induction Type: IV induction Ventilation: Mask ventilation without difficulty LMA: LMA inserted LMA Size: 4.0 Number of attempts: 1 Airway Equipment and Method: Bite block Placement Confirmation: positive ETCO2,  breath sounds checked- equal and bilateral and CO2 detector Tube secured with: Tape Dental Injury: Teeth and Oropharynx as per pre-operative assessment

## 2019-05-29 NOTE — Transfer of Care (Signed)
Immediate Anesthesia Transfer of Care Note  Patient: Tamara Osborne  Procedure(s) Performed: Procedure(s) (LRB): TANDEM RING INSERTION (N/A) OPERATIVE ULTRASOUND (N/A)  Patient Location: PACU  Anesthesia Type: General  Level of Consciousness: awake, sedated, patient cooperative and responds to stimulation  Airway & Oxygen Therapy: Patient Spontanous Breathing and Patient connected to [place] 02 and soft FM   Post-op Assessment: Report given to PACU RN, Post -op Vital signs reviewed and stable and Patient moving all extremities  Post vital signs: Reviewed and stable  Complications: No apparent anesthesia complications

## 2019-05-29 NOTE — Progress Notes (Signed)
Patient resting in hospital bed supine. No distress noted. Foley catheter has been discontinued by Brandt Loosen, RN. Assisted Dr. Sondra Come with removal of HDR equipment. Patient tolerated this very well. Minimal bloody discharge noted with equipment removal. Discontinued continuous IV fluid. Flushed right subclavian power port per protocol, removed access needle and applied bandaid to site. Patient tolerated this well. Assisted patient as she dawned street clothes. Wheeled patient to lobby for discharge home with husband. Assisted patient into her husband's car without incident. Patient alert and oriented x 3 and in no distress at discharge.

## 2019-05-29 NOTE — Addendum Note (Signed)
Encounter addended by: Heywood Footman, RN on: 05/29/2019 4:44 PM  Actions taken: Clinical Note Signed, Order list changed, Diagnosis association updated

## 2019-05-29 NOTE — Op Note (Signed)
05/29/2019  8:45 AM  PATIENT:  Tamara Osborne  44 y.o. female  PRE-OPERATIVE DIAGNOSIS:  CERVICAL CANCER  POST-OPERATIVE DIAGNOSIS:  CERVICAL CANCER  PROCEDURE:  Procedure(s): TANDEM RING INSERTION (N/A) OPERATIVE ULTRASOUND (N/A)  SURGEON:  Surgeon(s) and Role:    * Gery Pray, MD - Primary  PHYSICIAN ASSISTANT:   ASSISTANTS: none   ANESTHESIA:   general  EBL:  0 mL   BLOOD ADMINISTERED:none  DRAINS: Urinary Catheter (Foley)   LOCAL MEDICATIONS USED:  NONE  SPECIMEN:  No Specimen  DISPOSITION OF SPECIMEN:  N/A  COUNTS:  YES  TOURNIQUET:  * No tourniquets in log *  DICTATION: the patient's Port-acath was usedfor IV access in light ofdifficulties with IV access the first brachytherapy treatment. Shewas taken to outpatientOR #4. Timeout was performed for the procedure, estimated length of the procedure and preoperative medications. Patient was prepped and draped in the usual sterile fashion. Betadine was not used in light of the patient's seafood allergy. Patient had a Foley catheter placed without difficulty. Later during the procedure the bladder was backfilled with approximately 200 cc of sterile saline for ultrasound imaging purposes. Once the patient was asleep,exam under anesthesia showed the cervical mass to be markedly decreased in size and is estimated to be approximately 2.5 x2.5cm in size. No parametrial extension noted,no furtherextension into the anterior vaginal wall. Patient proceeded to undergo dilation of the cervix. The uterus sounded to ~ 10.0 cm. The uterus was mildly anteverted. There was some erythema noted to the cervical region but no active bleeding noted. After dilation of the cervix the patient had a 60 mm cervical sleeve placed within the endocervical canal and endometrial canal. She then had placement of a 60 mm tandem with a 45 degree orientation. This was then followed by placement of a 45 degree ring with a large  shielding cap in place. Patient then had placement of a rectal paddle posteriorly. Excellent images were obtained confirming the placement of the tandem within the central portion of the lower uterus segmentand endocervical canal. Patient tolerated the procedure well. She was transferred to the recovery room in stable condition. Later in the day the patient will be transferred to radiation oncology for planning and herfourthhigh-dose-rate treatment. She is to receive a dose of 5.5 Gy to the high risk clinical target volume. She is to receive1additional HDR treatment after this one today.    PLAN OF CARE: Transferred to radiation oncology for planning and treatment  PATIENT DISPOSITION:  PACU - hemodynamically stable.   Delay start of Pharmacological VTE agent (>24hrs) due to surgical blood loss or risk of bleeding: not applicable

## 2019-05-29 NOTE — Addendum Note (Signed)
Encounter addended by: Heywood Footman, RN on: 05/29/2019 4:48 PM  Actions taken: MAR administration accepted

## 2019-05-29 NOTE — Anesthesia Postprocedure Evaluation (Signed)
Anesthesia Post Note  Patient: Tamara Osborne  Procedure(s) Performed: TANDEM RING INSERTION (N/A Vagina ) OPERATIVE ULTRASOUND (N/A Abdomen)     Patient location during evaluation: PACU Anesthesia Type: General Level of consciousness: awake and alert Pain management: pain level controlled Vital Signs Assessment: post-procedure vital signs reviewed and stable Respiratory status: spontaneous breathing, nonlabored ventilation, respiratory function stable and patient connected to nasal cannula oxygen Cardiovascular status: blood pressure returned to baseline and stable Postop Assessment: no apparent nausea or vomiting Anesthetic complications: no    Last Vitals:  Vitals:   05/29/19 0845 05/29/19 0900  BP: (!) 151/81 (!) 155/87  Pulse: 89 88  Resp: 17 16  Temp:    SpO2: 96% 97%    Last Pain:  Vitals:   05/29/19 0900  PainSc: [place]

## 2019-05-30 ENCOUNTER — Inpatient Hospital Stay: Payer: Medicaid Other

## 2019-05-30 ENCOUNTER — Encounter (HOSPITAL_BASED_OUTPATIENT_CLINIC_OR_DEPARTMENT_OTHER): Payer: Self-pay | Admitting: Radiation Oncology

## 2019-05-30 ENCOUNTER — Inpatient Hospital Stay (HOSPITAL_BASED_OUTPATIENT_CLINIC_OR_DEPARTMENT_OTHER): Payer: Medicaid Other | Admitting: Hematology and Oncology

## 2019-05-30 ENCOUNTER — Other Ambulatory Visit: Payer: Self-pay

## 2019-05-30 ENCOUNTER — Encounter: Payer: Self-pay | Admitting: Oncology

## 2019-05-30 VITALS — BP 177/86 | HR 105 | Temp 98.3°F | Resp 18 | Ht 62.0 in | Wt 207.0 lb

## 2019-05-30 VITALS — BP 153/89 | HR 93 | Temp 97.7°F | Resp 18

## 2019-05-30 DIAGNOSIS — N939 Abnormal uterine and vaginal bleeding, unspecified: Secondary | ICD-10-CM

## 2019-05-30 DIAGNOSIS — N183 Chronic kidney disease, stage 3 unspecified: Secondary | ICD-10-CM

## 2019-05-30 DIAGNOSIS — I10 Essential (primary) hypertension: Secondary | ICD-10-CM | POA: Diagnosis not present

## 2019-05-30 DIAGNOSIS — C539 Malignant neoplasm of cervix uteri, unspecified: Secondary | ICD-10-CM

## 2019-05-30 DIAGNOSIS — R197 Diarrhea, unspecified: Secondary | ICD-10-CM

## 2019-05-30 DIAGNOSIS — Z5111 Encounter for antineoplastic chemotherapy: Secondary | ICD-10-CM | POA: Diagnosis not present

## 2019-05-30 DIAGNOSIS — E860 Dehydration: Secondary | ICD-10-CM

## 2019-05-30 LAB — CBC WITH DIFFERENTIAL/PLATELET
Abs Immature Granulocytes: 0.05 10*3/uL (ref 0.00–0.07)
Basophils Absolute: 0 10*3/uL (ref 0.0–0.1)
Basophils Relative: 0 %
Eosinophils Absolute: 0 10*3/uL (ref 0.0–0.5)
Eosinophils Relative: 1 %
HCT: 24.3 % — ABNORMAL LOW (ref 36.0–46.0)
Hemoglobin: 8 g/dL — ABNORMAL LOW (ref 12.0–15.0)
Immature Granulocytes: 1 %
Lymphocytes Relative: 5 %
Lymphs Abs: 0.3 10*3/uL — ABNORMAL LOW (ref 0.7–4.0)
MCH: 30.7 pg (ref 26.0–34.0)
MCHC: 32.9 g/dL (ref 30.0–36.0)
MCV: 93.1 fL (ref 80.0–100.0)
Monocytes Absolute: 0.4 10*3/uL (ref 0.1–1.0)
Monocytes Relative: 7 %
Neutro Abs: 4.7 10*3/uL (ref 1.7–7.7)
Neutrophils Relative %: 86 %
Platelets: 229 10*3/uL (ref 150–400)
RBC: 2.61 MIL/uL — ABNORMAL LOW (ref 3.87–5.11)
WBC: 5.4 10*3/uL (ref 4.0–10.5)
nRBC: 0.6 % — ABNORMAL HIGH (ref 0.0–0.2)

## 2019-05-30 LAB — COMPREHENSIVE METABOLIC PANEL
ALT: 17 U/L (ref 0–44)
AST: 10 U/L — ABNORMAL LOW (ref 15–41)
Albumin: 3.3 g/dL — ABNORMAL LOW (ref 3.5–5.0)
Alkaline Phosphatase: 79 U/L (ref 38–126)
Anion gap: 10 (ref 5–15)
BUN: 21 mg/dL — ABNORMAL HIGH (ref 6–20)
CO2: 24 mmol/L (ref 22–32)
Calcium: 9 mg/dL (ref 8.9–10.3)
Chloride: 105 mmol/L (ref 98–111)
Creatinine, Ser: 1.26 mg/dL — ABNORMAL HIGH (ref 0.44–1.00)
GFR calc Af Amer: >60 mL/min (ref >60)
GFR calc non Af Amer: 53 mL/min — ABNORMAL LOW (ref >60)
Glucose, Bld: 208 mg/dL — ABNORMAL HIGH (ref 70–99)
Potassium: 4 mmol/L (ref 3.5–5.1)
Sodium: 139 mmol/L (ref 135–145)
Total Bilirubin: 0.4 mg/dL (ref 0.3–1.2)
Total Protein: 6.7 g/dL (ref 6.5–8.1)

## 2019-05-30 LAB — PREPARE RBC (CROSSMATCH)

## 2019-05-30 LAB — SAMPLE TO BLOOD BANK

## 2019-05-30 LAB — MAGNESIUM: Magnesium: 1.6 mg/dL — ABNORMAL LOW (ref 1.7–2.4)

## 2019-05-30 MED ORDER — ACETAMINOPHEN 325 MG PO TABS
ORAL_TABLET | ORAL | Status: AC
  Filled 2019-05-30: qty 2

## 2019-05-30 MED ORDER — SODIUM CHLORIDE 0.9% IV SOLUTION
250.0000 mL | Freq: Once | INTRAVENOUS | Status: AC
  Administered 2019-05-30: 250 mL via INTRAVENOUS
  Filled 2019-05-30: qty 250

## 2019-05-30 MED ORDER — HEPARIN SOD (PORK) LOCK FLUSH 100 UNIT/ML IV SOLN
500.0000 [IU] | Freq: Once | INTRAVENOUS | Status: AC
  Administered 2019-05-30: 09:00:00 500 [IU]
  Filled 2019-05-30: qty 5

## 2019-05-30 MED ORDER — DIPHENHYDRAMINE HCL 25 MG PO CAPS
ORAL_CAPSULE | ORAL | Status: AC
  Filled 2019-05-30: qty 1

## 2019-05-30 MED ORDER — SODIUM CHLORIDE 0.9% FLUSH
10.0000 mL | Freq: Once | INTRAVENOUS | Status: AC
  Administered 2019-05-30: 10 mL
  Filled 2019-05-30: qty 10

## 2019-05-30 MED ORDER — ACETAMINOPHEN 325 MG PO TABS
650.0000 mg | ORAL_TABLET | Freq: Once | ORAL | Status: AC
  Administered 2019-05-30: 650 mg via ORAL

## 2019-05-30 MED ORDER — HEPARIN SOD (PORK) LOCK FLUSH 100 UNIT/ML IV SOLN
250.0000 [IU] | INTRAVENOUS | Status: DC | PRN
  Filled 2019-05-30: qty 5

## 2019-05-30 MED ORDER — SODIUM CHLORIDE 0.9% FLUSH
10.0000 mL | INTRAVENOUS | Status: AC | PRN
  Administered 2019-05-30: 10 mL
  Filled 2019-05-30: qty 10

## 2019-05-30 MED ORDER — SODIUM CHLORIDE 0.9 % IV SOLN
Freq: Once | INTRAVENOUS | Status: AC
  Filled 2019-05-30: qty 250

## 2019-05-30 MED ORDER — HEPARIN SOD (PORK) LOCK FLUSH 100 UNIT/ML IV SOLN
500.0000 [IU] | Freq: Once | INTRAVENOUS | Status: AC
  Administered 2019-05-30: 500 [IU]
  Filled 2019-05-30: qty 5

## 2019-05-30 MED ORDER — DIPHENHYDRAMINE HCL 25 MG PO CAPS
25.0000 mg | ORAL_CAPSULE | Freq: Once | ORAL | Status: AC
  Administered 2019-05-30: 25 mg via ORAL

## 2019-05-30 NOTE — Assessment & Plan Note (Signed)
Renal function continues to improve We will observe carefully The patient will continue aggressive hydration as tolerated

## 2019-05-30 NOTE — Assessment & Plan Note (Signed)
She has intermittent diarrhea likely secondary to residual side effects from treatment She will take Imodium as needed and hydration as tolerated

## 2019-05-30 NOTE — Progress Notes (Signed)
[facility] OFFICE PROGRESS NOTE  Patient Care Team: [place], [place] as PCP - General (Family Medicine)  ASSESSMENT & PLAN:  Squamous cell carcinoma of cervix ([place]) She completed recent chemotherapy without major side effects except for anemia, nausea and mild changes in bowel habits We will continue to monitor for side effects closely She will need IV fluid support and blood transfusion today I will see her back in 2 weeks for further follow-up  CKD (chronic kidney disease), symptom management only, stage 3 (moderate) Renal function continues to improve We will observe carefully The patient will continue aggressive hydration as tolerated  Abnormal vaginal bleeding We discussed some of the risks, benefits, and alternatives of blood transfusions. The patient is symptomatic from anemia and the hemoglobin level is critically low.  Some of the side-effects to be expected including risks of transfusion reactions, chills, infection, syndrome of volume overload and risk of hospitalization from various reasons and the patient is willing to proceed and went ahead to sign consent today. She will receive blood transfusion today  Essential hypertension Her blood pressure is quite elevated but she did not take one of her blood pressure medications today due to sedated effects She will continue medical management  Dehydration She is somewhat dehydrated I recommend IV fluid support  Hypomagnesemia She has intermittent hypomagnesemia secondary to recent diarrhea She will continue oral magnesium replacement  Diarrhea She has intermittent diarrhea likely secondary to residual side effects from treatment She will take Imodium as needed and hydration as tolerated   Orders Placed This Encounter  Procedures  . Care order/instruction    Transfuse Parameters    Standing Status:   Future    Standing Expiration Date:   05/29/2020  . Informed Consent Details:  Physician/Practitioner Attestation; Transcribe to consent form and obtain patient signature    Standing Status:   Future    Standing Expiration Date:   05/29/2020    Order Specific Question:   Physician/Practitioner attestation of informed consent for blood and or blood product transfusion    Answer:   I, the physician/practitioner, attest that I have discussed with the patient the benefits, risks, side effects, alternatives, likelihood of achieving goals and potential problems during recovery for the procedure that I have provided informed consent.    Order Specific Question:   Product(s)    Answer:   All Product(s)  . SCHEDULING COMMUNICATION    Please schedule 3 hour infusion visit for fluids and hydration  . Type and screen    Standing Status:   Future    Standing Expiration Date:   05/29/2020    All questions were answered. The patient knows to call the clinic with any problems, questions or concerns. The total time spent in the appointment was 40 minutes encounter with patients including review of chart and various tests results, discussions about plan of care and coordination of care plan   [name], MD 05/30/2019 10:17 AM  INTERVAL HISTORY: Please see below for problem oriented charting. She returns for further follow-up Her vaginal bleeding has stopped except when she undergoes radiation treatment She continues to have nausea She was prescribed scopolamine patch that seems to help She felt dehydrated Her appetite is fair She have diarrhea alternating with constipation Her energy level is poor today and she felt weak  SUMMARY OF ONCOLOGIC HISTORY: Oncology History  Squamous cell carcinoma of cervix ([place])  03/21/2019 Pathology Results   The biopsy is superficial and therefore depth of invasion cannot  be  determined.  There is at least carcinoma in-situ.    03/23/2019 Initial Diagnosis   Squamous cell carcinoma of cervix ([place])   03/23/2019 Pathology Results   CERVIX, 11 O  CLOCK, BIOPSY:  -  Squamous cell carcinoma, invasive  -  See comment   03/28/2019 PET scan   1. Hypermetabolic cervical mass measures roughly 7.6 by 6.9 by 4.9 cm, compatible with malignancy. No adenopathy or distant metastatic spread identified. 2. Extending cephalad and anterior to the uterine fundus, there is a 450 cubic cm simple fluid density structure without hypermetabolic activity. This may represent a right adnexal cyst (favored), peritoneal inclusion cyst, or (if the patient has a remote history of pancreatitis) a remote pseudocyst. 3. Faint ground density nodularity in both lower lobes which could be from atypical infection or extrinsic allergic alveolitis. Given nodular appearance of some of this ground-glass density, I would recommend follow up chest CT in 3 months time in order to ensure clearance. 4. Moderate cardiomegaly, cause uncertain. The patient also has advanced for age/gender atherosclerosis. Aortic Atherosclerosis (ICD10-I70.0).     04/04/2019 Procedure   Successful placement of a right internal jugular approach power injectable Port-A-Cath. The catheter is ready for immediate use   04/07/2019 -  Chemotherapy   The patient had weekly cisplatin for chemotherapy treatment.       REVIEW OF SYSTEMS:   Constitutional: Denies fevers, chills or abnormal weight loss Eyes: Denies blurriness of vision Ears, nose, mouth, throat, and face: Denies mucositis or sore throat Respiratory: Denies cough, dyspnea or wheezes Cardiovascular: Denies palpitation, chest discomfort or lower extremity swelling Skin: Denies abnormal skin rashes Lymphatics: Denies new lymphadenopathy or easy bruising Neurological:Denies numbness, tingling or new weaknesses Behavioral/Psych: Mood is stable, no new changes  All other systems were reviewed with the patient and are negative.  I have reviewed the past medical history, past surgical history, social history and family history with the patient and  they are unchanged from previous note.  ALLERGIES:  is allergic to penicillins; shellfish allergy; and sulfa antibiotics.  MEDICATIONS:  No current facility-administered medications for this visit.   No current outpatient medications on file.   Facility-Administered Medications Ordered in Other Visits  Medication Dose Route Frequency Provider Last Rate Last Admin  . Chlorhexidine Gluconate Cloth 2 % PADS 6 each  6 each Topical Daily Gery Pray, MD      . HYDROmorphone (DILAUDID) injection 0.25-0.5 mg  0.25-0.5 mg Intravenous Q5 min PRN Pervis Hocking, DO   0.25 mg at 05/29/19 0909  . lactated ringers infusion   Intravenous Continuous Effie Berkshire, MD 50 mL/hr at 05/29/19 0935 New Bag at 05/29/19 0935  . meperidine (DEMEROL) injection 6.25-12.5 mg  6.25-12.5 mg Intravenous Q5 min PRN Nunzio Cobbs M, DO      . oxyCODONE (Oxy IR/ROXICODONE) immediate release tablet 5 mg  5 mg Oral Once PRN Pervis Hocking, DO       Or  . oxyCODONE (ROXICODONE) 5 MG/5ML solution 5 mg  5 mg Oral Once PRN Nunzio Cobbs M, DO      . promethazine (PHENERGAN) injection 6.25-12.5 mg  6.25-12.5 mg Intravenous Q15 min PRN Nunzio Cobbs M, DO      . scopolamine (TRANSDERM-SCOP) 1 MG/3DAYS 1.5 mg  1 patch Transdermal Q72H Pervis Hocking, DO   1.5 mg at 05/29/19 0616  . sodium chloride flush (NS) 0.9 % injection 10-40 mL  10-40 mL Intracatheter PRN Gery Pray, MD  PHYSICAL EXAMINATION: ECOG PERFORMANCE STATUS: 2 - Symptomatic, <50% confined to bed  Vitals:   05/30/19 0955  BP: (!) 177/86  Pulse: (!) 105  Resp: 18  Temp: 98.3 F (36.8 C)  SpO2: 99%   Filed Weights   05/30/19 0955  Weight: 207 lb (93.9 kg)    GENERAL:alert, no distress and comfortable.  She looks somewhat pale SKIN: skin color, texture, turgor are normal, no rashes or significant lesions EYES: normal, Conjunctiva are pink and non-injected, sclera clear OROPHARYNX:no exudate, no erythema  and lips, buccal mucosa, and tongue normal  NECK: supple, thyroid normal size, non-tender, without nodularity LYMPH:  no palpable lymphadenopathy in the cervical, axillary or inguinal LUNGS: clear to auscultation and percussion with normal breathing effort HEART: Tachycardia, no murmurs and no lower extremity edema ABDOMEN:abdomen soft, non-tender and normal bowel sounds Musculoskeletal:no cyanosis of digits and no clubbing  NEURO: alert & oriented x 3 with fluent speech, no focal motor/sensory deficits  LABORATORY DATA:  I have reviewed the data as listed    Component Value Date/Time   NA 139 05/30/2019 0919   K 4.0 05/30/2019 0919   CL 105 05/30/2019 0919   CO2 24 05/30/2019 0919   GLUCOSE 208 (H) 05/30/2019 0919   BUN 21 (H) 05/30/2019 0919   CREATININE 1.26 (H) 05/30/2019 0919   CALCIUM 9.0 05/30/2019 0919   PROT 6.7 05/30/2019 0919   ALBUMIN 3.3 (L) 05/30/2019 0919   AST 10 (L) 05/30/2019 0919   ALT 17 05/30/2019 0919   ALKPHOS 79 05/30/2019 0919   BILITOT 0.4 05/30/2019 0919   GFRNONAA 53 (L) 05/30/2019 0919   GFRAA >60 05/30/2019 0919    No results found for: SPEP, UPEP  Lab Results  Component Value Date   WBC 5.4 05/30/2019   NEUTROABS 4.7 05/30/2019   HGB 8.0 (L) 05/30/2019   HCT 24.3 (L) 05/30/2019   MCV 93.1 05/30/2019   PLT 229 05/30/2019      Chemistry      Component Value Date/Time   NA 139 05/30/2019 0919   K 4.0 05/30/2019 0919   CL 105 05/30/2019 0919   CO2 24 05/30/2019 0919   BUN 21 (H) 05/30/2019 0919   CREATININE 1.26 (H) 05/30/2019 0919      Component Value Date/Time   CALCIUM 9.0 05/30/2019 0919   ALKPHOS 79 05/30/2019 0919   AST 10 (L) 05/30/2019 0919   ALT 17 05/30/2019 0919   BILITOT 0.4 05/30/2019 0919       RADIOGRAPHIC STUDIES: I have personally reviewed the radiological images as listed and agreed with the findings in the report. [place] Intraoperative  Result Date: 05/29/2019 CLINICAL DATA:  Ultrasound was provided for use  by the ordering physician, and a technical charge was applied by the performing facility.  No radiologist interpretation/professional services rendered.   [place] Intraoperative  Result Date: 05/22/2019 CLINICAL DATA:  Ultrasound was provided for use by the ordering physician, and a technical charge was applied by the performing facility.  No radiologist interpretation/professional services rendered.   [place] Intraoperative  Result Date: 05/15/2019 CLINICAL DATA:  Ultrasound was provided for use by the ordering physician, and a technical charge was applied by the performing facility.  No radiologist interpretation/professional services rendered.   [place] Intraoperative  Result Date: 05/09/2019 CLINICAL DATA:  Ultrasound was provided for use by the ordering physician, and a technical charge was applied by the performing facility.  No radiologist interpretation/professional services rendered.

## 2019-05-30 NOTE — Patient Instructions (Signed)

## 2019-05-30 NOTE — Assessment & Plan Note (Signed)
We discussed some of the risks, benefits, and alternatives of blood transfusions. The patient is symptomatic from anemia and the hemoglobin level is critically low.  Some of the side-effects to be expected including risks of transfusion reactions, chills, infection, syndrome of volume overload and risk of hospitalization from various reasons and the patient is willing to proceed and went ahead to sign consent today. She will receive blood transfusion today

## 2019-05-30 NOTE — Progress Notes (Signed)
Counseling Intern called patient for scheduled telehealth counseling session. Pt did not answer and CI left a voicemail encouraging pt to call to reschedule. CI will call pt tomorrow to check-in.  Art Buff [facility] [facility] Counseling Intern Voicemail:  [phone]

## 2019-05-30 NOTE — Assessment & Plan Note (Signed)
She is somewhat dehydrated I recommend IV fluid support

## 2019-05-30 NOTE — Assessment & Plan Note (Signed)
She has intermittent hypomagnesemia secondary to recent diarrhea She will continue oral magnesium replacement

## 2019-05-30 NOTE — Assessment & Plan Note (Signed)
Her blood pressure is quite elevated but she did not take one of her blood pressure medications today due to sedated effects She will continue medical management

## 2019-05-30 NOTE — Progress Notes (Signed)
Tamara Osborne called and said she needs a form filled out by the doctor to qualify for disability.  Gave her our fax number to have them send the form.

## 2019-05-30 NOTE — Assessment & Plan Note (Signed)
She completed recent chemotherapy without major side effects except for anemia, nausea and mild changes in bowel habits We will continue to monitor for side effects closely She will need IV fluid support and blood transfusion today I will see her back in 2 weeks for further follow-up

## 2019-05-30 NOTE — Progress Notes (Addendum)
Spoke w/ via phone for pre-op interview---Tamara Osborne needs dos----urine poct            COVID test ------06-01-2019 815 am Arrive at -------530 am 06-05-2019 NPO after ------midnight Medications to take morning of surgery -----clonazepam prn, clonidine prn, lorazepam prn, compazine prn Diabetic medication -----n/a Patient Special Instructions -----none Pre-Op special Istructions -----none Patient verbalized understanding of instructions that were given at this phone interview. Patient denies shortness of breath, chest pain, fever, cough a this phone interview.

## 2019-05-31 ENCOUNTER — Telehealth: Payer: Self-pay | Admitting: Hematology and Oncology

## 2019-05-31 LAB — TYPE AND SCREEN
ABO/RH(D): A NEG
Antibody Screen: NEGATIVE
Unit division: 0

## 2019-05-31 LAB — BPAM RBC
Blood Product Expiration Date: 202104212359
ISSUE DATE / TIME: 202103301223
Unit Type and Rh: 600

## 2019-05-31 NOTE — Telephone Encounter (Signed)
Scheduled per 3/30 sch msg. Called and spoke with pt, confirmed 4/12 appt
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
## 2019-06-01 ENCOUNTER — Other Ambulatory Visit (HOSPITAL_COMMUNITY)
Admission: RE | Admit: 2019-06-01 | Discharge: 2019-06-01 | Disposition: A | Discharge status: Home / Self Care | Payer: Medicaid Other | Source: Ambulatory Visit | Attending: Radiation Oncology | Admitting: Radiation Oncology

## 2019-06-01 ENCOUNTER — Other Ambulatory Visit (HOSPITAL_COMMUNITY): Payer: Self-pay

## 2019-06-01 DIAGNOSIS — Z20822 Contact with and (suspected) exposure to covid-19: Secondary | ICD-10-CM | POA: Diagnosis not present

## 2019-06-01 DIAGNOSIS — Z01812 Encounter for preprocedural laboratory examination: Secondary | ICD-10-CM | POA: Diagnosis present

## 2019-06-01 LAB — SARS CORONAVIRUS 2 (TAT 6-24 HRS): SARS Coronavirus 2: NEGATIVE

## 2019-06-02 ENCOUNTER — Ambulatory Visit: Payer: Self-pay

## 2019-06-04 NOTE — Progress Notes (Signed)
  Radiation Oncology         [phone] ________________________________  Name: Tamara Osborne MRN: [id number]  Date: 06/05/2019  DOB: 04-15-1975  SIMULATION AND TREATMENT PLANNING NOTE HDR BRACHYTHERAPY  DIAGNOSIS: Stage II-B squamous cell carcinoma of the cervix  NARRATIVE:  The patient was brought to the [facility].  Identity was confirmed.  All relevant records and images related to the planned course of therapy were reviewed.  The patient freely provided informed written consent to proceed with treatment after reviewing the details related to the planned course of therapy. The consent form was witnessed and verified by the simulation staff.  Then, the patient was set-up in a stable reproducible  supine position for radiation therapy.  CT images were obtained.  Surface markings were placed.  The CT images were loaded into the planning software.  Then the target and avoidance structures were contoured.  Treatment planning then occurred.  The radiation prescription was entered and confirmed.   I have requested : Brachytherapy Isodose Plan and Dosimetry Calculations to plan the radiation distribution.    PLAN: The patient will receive 5.5 Gy in 1 fraction directed at the high risk clinical target volume. Patient will be treated with iridium 192 is the high-dose-rate source. The tandem ring system will be used today for treatment.  ________________________________   Blair Promise, PhD, MD  This document serves as a record of services personally performed by Gery Pray, MD. It was created on his behalf by Clerance Lav, a trained medical scribe. The creation of this record is based on the scribe's personal observations and the provider's statements to them. This document has been checked and approved by the attending provider.

## 2019-06-04 NOTE — Progress Notes (Signed)
  Radiation Oncology         [phone] ________________________________  Name: Tamara Osborne MRN: [id number]  Date: 06/05/2019  DOB: 1975/03/07  CC: [facility]  HDR BRACHYTHERAPY NOTE  DIAGNOSIS: Stage II-B squamous cell carcinoma of the cervix  NARRATIVE: The patient was brought to the HDR suite. Identity was confirmed. All relevant records and images related to the planned course of therapy were reviewed. The patient freely provided informed written consent to proceed with treatment after reviewing the details related to the planned course of therapy. The consent form was witnessed and verified by the simulation staff. Then, the patient was set-up in a stable reproducible supine position for radiation therapy. The tandem ring system was accessed and fiducial markers were placed within the tandem and ring.   Simple treatment device note: On the operating room the patient had construction of her custom tandem ring system. She will be treated with a 45 tandem/ring system. The patient had placement of a 60 mm tandem. A cervical ring with a large shielding was used for her treatment. A rectal paddle was also part of her custom set up device.  Verification simulation note: An AP and lateral film was obtained through the pelvis area. This was compared to the patient's planning films documenting accurate position of the tandem/ring system for treatment.  High-dose-rate brachytherapy treatment note:  The remote afterloading device was accessed through catheter system and attached to the tandem ring system. Patient then proceeded to undergo her fifth high-dose-rate treatment directed at the cervix. The patient was prescribed a dose of 5.5 Gy to the high risk clinical target volume.. Patient was treated with 2 channels using 26 dwell positions. Treatment time was 915.9 seconds. The patient tolerated the procedure well. After completion of her  therapy, a radiation survey was performed documenting return of the iridium source into the Gamma[employer] safe. The patient was then transferred to the nursing suite. She then had removal of the rectal paddle followed by the tandem and ring system. The patient tolerated the removal well.  PLAN: The patient will return in one month for routine follow-up.  She has completed her definitive course of radiation along with radiosensitizing chemotherapy. ________________________________     Blair Promise, PhD, MD  This document serves as a record of services personally performed by Gery Pray, MD. It was created on his behalf by Clerance Lav, a trained medical scribe. The creation of this record is based on the scribe's personal observations and the provider's statements to them. This document has been checked and approved by the attending provider.

## 2019-06-05 ENCOUNTER — Ambulatory Visit (HOSPITAL_BASED_OUTPATIENT_CLINIC_OR_DEPARTMENT_OTHER)
Admission: RE | Admit: 2019-06-05 | Discharge: 2019-06-05 | Disposition: A | Discharge status: Still In Hospital | Payer: Medicaid Other | Source: Ambulatory Visit | Attending: Radiation Oncology | Admitting: Radiation Oncology

## 2019-06-05 ENCOUNTER — Ambulatory Visit (HOSPITAL_BASED_OUTPATIENT_CLINIC_OR_DEPARTMENT_OTHER): Payer: Medicaid Other | Admitting: Anesthesiology

## 2019-06-05 ENCOUNTER — Encounter: Payer: Self-pay | Admitting: Radiation Oncology

## 2019-06-05 ENCOUNTER — Ambulatory Visit
Admission: RE | Admit: 2019-06-05 | Discharge: 2019-06-05 | Disposition: A | Discharge status: Home / Self Care | Payer: Medicaid Other | Source: Ambulatory Visit | Attending: Radiation Oncology | Admitting: Radiation Oncology

## 2019-06-05 ENCOUNTER — Encounter (HOSPITAL_BASED_OUTPATIENT_CLINIC_OR_DEPARTMENT_OTHER): Payer: Self-pay | Admitting: Radiation Oncology

## 2019-06-05 ENCOUNTER — Encounter (HOSPITAL_BASED_OUTPATIENT_CLINIC_OR_DEPARTMENT_OTHER)
Admission: RE | Disposition: A | Discharge status: Still In Hospital | Payer: Self-pay | Source: Ambulatory Visit | Attending: Radiation Oncology

## 2019-06-05 ENCOUNTER — Ambulatory Visit (HOSPITAL_COMMUNITY)
Admission: RE | Admit: 2019-06-05 | Discharge: 2019-06-05 | Disposition: A | Discharge status: Home / Self Care | Payer: Medicaid Other | Source: Ambulatory Visit | Attending: Radiation Oncology | Admitting: Radiation Oncology

## 2019-06-05 DIAGNOSIS — C539 Malignant neoplasm of cervix uteri, unspecified: Secondary | ICD-10-CM | POA: Insufficient documentation

## 2019-06-05 DIAGNOSIS — Z841 Family history of disorders of kidney and ureter: Secondary | ICD-10-CM | POA: Insufficient documentation

## 2019-06-05 DIAGNOSIS — Z510 Encounter for antineoplastic radiation therapy: Secondary | ICD-10-CM | POA: Insufficient documentation

## 2019-06-05 DIAGNOSIS — Z8249 Family history of ischemic heart disease and other diseases of the circulatory system: Secondary | ICD-10-CM | POA: Diagnosis not present

## 2019-06-05 DIAGNOSIS — Z79899 Other long term (current) drug therapy: Secondary | ICD-10-CM | POA: Diagnosis not present

## 2019-06-05 DIAGNOSIS — F419 Anxiety disorder, unspecified: Secondary | ICD-10-CM | POA: Insufficient documentation

## 2019-06-05 DIAGNOSIS — Z808 Family history of malignant neoplasm of other organs or systems: Secondary | ICD-10-CM | POA: Insufficient documentation

## 2019-06-05 DIAGNOSIS — Z6837 Body mass index (BMI) 37.0-37.9, adult: Secondary | ICD-10-CM | POA: Insufficient documentation

## 2019-06-05 DIAGNOSIS — Z8051 Family history of malignant neoplasm of kidney: Secondary | ICD-10-CM | POA: Insufficient documentation

## 2019-06-05 DIAGNOSIS — Z91013 Allergy to seafood: Secondary | ICD-10-CM | POA: Diagnosis not present

## 2019-06-05 DIAGNOSIS — Z801 Family history of malignant neoplasm of trachea, bronchus and lung: Secondary | ICD-10-CM | POA: Insufficient documentation

## 2019-06-05 DIAGNOSIS — I10 Essential (primary) hypertension: Secondary | ICD-10-CM | POA: Insufficient documentation

## 2019-06-05 DIAGNOSIS — G4730 Sleep apnea, unspecified: Secondary | ICD-10-CM | POA: Diagnosis not present

## 2019-06-05 DIAGNOSIS — Z923 Personal history of irradiation: Secondary | ICD-10-CM | POA: Diagnosis not present

## 2019-06-05 DIAGNOSIS — Z880 Allergy status to penicillin: Secondary | ICD-10-CM | POA: Diagnosis not present

## 2019-06-05 DIAGNOSIS — Z833 Family history of diabetes mellitus: Secondary | ICD-10-CM | POA: Diagnosis not present

## 2019-06-05 DIAGNOSIS — F329 Major depressive disorder, single episode, unspecified: Secondary | ICD-10-CM | POA: Diagnosis not present

## 2019-06-05 DIAGNOSIS — Z882 Allergy status to sulfonamides status: Secondary | ICD-10-CM | POA: Diagnosis not present

## 2019-06-05 DIAGNOSIS — Z9221 Personal history of antineoplastic chemotherapy: Secondary | ICD-10-CM | POA: Insufficient documentation

## 2019-06-05 HISTORY — PX: OPERATIVE ULTRASOUND: SHX5996

## 2019-06-05 HISTORY — PX: TANDEM RING INSERTION: SHX6199

## 2019-06-05 SURGERY — INSERTION, UTERINE TANDEM AND RING OR CYLINDER, FOR BRACHYTHERAPY
Anesthesia: General | Site: Vagina

## 2019-06-05 MED ORDER — LIDOCAINE 2% (20 MG/ML) 5 ML SYRINGE
INTRAMUSCULAR | Status: AC
  Filled 2019-06-05: qty 5

## 2019-06-05 MED ORDER — ONDANSETRON HCL 4 MG/2ML IJ SOLN
INTRAMUSCULAR | Status: AC
  Filled 2019-06-05: qty 2

## 2019-06-05 MED ORDER — DEXAMETHASONE SODIUM PHOSPHATE 10 MG/ML IJ SOLN
INTRAMUSCULAR | Status: AC
  Filled 2019-06-05: qty 1

## 2019-06-05 MED ORDER — LACTATED RINGERS IV SOLN
INTRAVENOUS | Status: DC
  Filled 2019-06-05 (×2): qty 250

## 2019-06-05 MED ORDER — FENTANYL CITRATE (PF) 100 MCG/2ML IJ SOLN
INTRAMUSCULAR | Status: AC
  Filled 2019-06-05: qty 2

## 2019-06-05 MED ORDER — FENTANYL CITRATE (PF) 100 MCG/2ML IJ SOLN
INTRAMUSCULAR | Status: DC | PRN
  Administered 2019-06-05 (×2): 50 ug via INTRAVENOUS

## 2019-06-05 MED ORDER — LACTATED RINGERS IV SOLN
INTRAVENOUS | Status: DC
  Filled 2019-06-05: qty 1000

## 2019-06-05 MED ORDER — SODIUM CHLORIDE 0.9 % IR SOLN
Status: DC | PRN
  Administered 2019-06-05: 1000 mL via INTRAVESICAL

## 2019-06-05 MED ORDER — SCOPOLAMINE 1 MG/3DAYS TD PT72
MEDICATED_PATCH | TRANSDERMAL | Status: AC
  Filled 2019-06-05: qty 1

## 2019-06-05 MED ORDER — PROPOFOL 10 MG/ML IV BOLUS
INTRAVENOUS | Status: DC | PRN
  Administered 2019-06-05: 200 mg via INTRAVENOUS

## 2019-06-05 MED ORDER — DEXAMETHASONE SODIUM PHOSPHATE 10 MG/ML IJ SOLN
INTRAMUSCULAR | Status: DC | PRN
  Administered 2019-06-05: 5 mg via INTRAVENOUS

## 2019-06-05 MED ORDER — MIDAZOLAM HCL 5 MG/5ML IJ SOLN
INTRAMUSCULAR | Status: DC | PRN
  Administered 2019-06-05: 2 mg via INTRAVENOUS

## 2019-06-05 MED ORDER — MIDAZOLAM HCL 2 MG/2ML IJ SOLN
INTRAMUSCULAR | Status: AC
  Filled 2019-06-05: qty 2

## 2019-06-05 MED ORDER — PROPOFOL 10 MG/ML IV BOLUS
INTRAVENOUS | Status: AC
  Filled 2019-06-05: qty 40

## 2019-06-05 MED ORDER — PROMETHAZINE HCL 25 MG/ML IJ SOLN
6.2500 mg | INTRAMUSCULAR | Status: DC | PRN
  Filled 2019-06-05: qty 1

## 2019-06-05 MED ORDER — ONDANSETRON HCL 4 MG/2ML IJ SOLN
INTRAMUSCULAR | Status: DC | PRN
  Administered 2019-06-05: 4 mg via INTRAVENOUS

## 2019-06-05 MED ORDER — OXYCODONE HCL 5 MG PO TABS
5.0000 mg | ORAL_TABLET | Freq: Once | ORAL | Status: DC | PRN
  Filled 2019-06-05: qty 1

## 2019-06-05 MED ORDER — SCOPOLAMINE 1 MG/3DAYS TD PT72
MEDICATED_PATCH | TRANSDERMAL | Status: DC | PRN
  Administered 2019-06-05: 1.5 mg via TRANSDERMAL

## 2019-06-05 MED ORDER — CHLORHEXIDINE GLUCONATE CLOTH 2 % EX PADS
6.0000 | MEDICATED_PAD | Freq: Every day | CUTANEOUS | Status: DC
  Filled 2019-06-05: qty 6

## 2019-06-05 MED ORDER — FENTANYL CITRATE (PF) 100 MCG/2ML IJ SOLN
25.0000 ug | INTRAMUSCULAR | Status: DC | PRN
  Filled 2019-06-05: qty 1

## 2019-06-05 MED ORDER — LIDOCAINE 2% (20 MG/ML) 5 ML SYRINGE
INTRAMUSCULAR | Status: DC | PRN
  Administered 2019-06-05: 100 mg via INTRAVENOUS

## 2019-06-05 MED ORDER — FENTANYL CITRATE (PF) 100 MCG/2ML IJ SOLN
25.0000 ug | INTRAMUSCULAR | Status: DC | PRN
  Administered 2019-06-05 (×2): 50 ug via INTRAVENOUS
  Filled 2019-06-05: qty 1

## 2019-06-05 MED ORDER — OXYCODONE HCL 5 MG/5ML PO SOLN
5.0000 mg | Freq: Once | ORAL | Status: DC | PRN
  Filled 2019-06-05: qty 5

## 2019-06-05 MED ORDER — SODIUM CHLORIDE 0.9% FLUSH
10.0000 mL | Freq: Two times a day (BID) | INTRAVENOUS | Status: DC
  Filled 2019-06-05: qty 40

## 2019-06-05 SURGICAL SUPPLY — 19 items
BNDG CONFORM 2 STRL LF (GAUZE/BANDAGES/DRESSINGS) IMPLANT
COVER WAND RF STERILE (DRAPES) ×4 IMPLANT
DILATOR CANAL MILEX (MISCELLANEOUS) IMPLANT
DRSG PAD ABDOMINAL 8X10 ST (GAUZE/BANDAGES/DRESSINGS) ×4 IMPLANT
GAUZE 4X4 16PLY RFD (DISPOSABLE) ×4 IMPLANT
GLOVE BIO SURGEON STRL SZ7.5 (GLOVE) ×12 IMPLANT
GOWN STRL REUS W/TWL LRG LVL3 (GOWN DISPOSABLE) ×8 IMPLANT
HOLDER FOLEY CATH W/STRAP (MISCELLANEOUS) ×4 IMPLANT
HOVERMATT SINGLE USE (MISCELLANEOUS) ×4 IMPLANT
IV NS 1000ML (IV SOLUTION) ×2
IV NS 1000ML BAXH (IV SOLUTION) ×2 IMPLANT
IV SET EXTENSION GRAVITY 40 LF (IV SETS) ×4 IMPLANT
KIT TURNOVER CYSTO (KITS) ×4 IMPLANT
PACK VAGINAL MINOR WOMEN LF (CUSTOM PROCEDURE TRAY) ×4 IMPLANT
PACKING VAGINAL (PACKING) IMPLANT
PAD OB MATERNITY 4.3X12.25 (PERSONAL CARE ITEMS) IMPLANT
TOWEL OR 17X26 10 PK STRL BLUE (TOWEL DISPOSABLE) ×4 IMPLANT
TRAY FOLEY W/BAG SLVR 14FR (SET/KITS/TRAYS/PACK) ×4 IMPLANT
WATER STERILE IRR 500ML POUR (IV SOLUTION) ×4 IMPLANT

## 2019-06-05 NOTE — Anesthesia Preprocedure Evaluation (Signed)
Anesthesia Evaluation  Patient identified by MRN, date of birth, ID band Patient awake    Reviewed: Allergy & Precautions, NPO status , Patient's Chart, lab work & pertinent test results  History of Anesthesia Complications (+) PONV  Airway Mallampati: III  TM Distance: >3 FB Neck ROM: Full    Dental no notable dental hx.    Pulmonary sleep apnea ,    Pulmonary exam normal breath sounds clear to auscultation       Cardiovascular hypertension, negative cardio ROS Normal cardiovascular exam Rhythm:Regular Rate:Normal     Neuro/Psych Anxiety negative neurological ROS     GI/Hepatic negative GI ROS, Neg liver ROS,   Endo/Other  Morbid obesity  Renal/GU negative Renal ROS  negative genitourinary   Musculoskeletal negative musculoskeletal ROS (+)   Abdominal   Peds negative pediatric ROS (+)  Hematology negative hematology ROS (+)   Anesthesia Other Findings   Reproductive/Obstetrics negative OB ROS                             Anesthesia Physical Anesthesia Plan  ASA: III  Anesthesia Plan: General   Post-op Pain Management:    Induction: Intravenous  PONV Risk Score and Plan: 4 or greater and Ondansetron, Dexamethasone, Treatment may vary due to age or medical condition and Midazolam  Airway Management Planned: LMA  Additional Equipment:   Intra-op Plan:   Post-operative Plan: Extubation in OR  Informed Consent: I have reviewed the patients History and Physical, chart, labs and discussed the procedure including the risks, benefits and alternatives for the proposed anesthesia with the patient or authorized representative who has indicated his/her understanding and acceptance.     Dental advisory given  Plan Discussed with: CRNA and Surgeon  Anesthesia Plan Comments:         Anesthesia Quick Evaluation

## 2019-06-05 NOTE — Interval H&P Note (Signed)
History and Physical Interval Note:  06/05/2019 7:22 AM  Tamara Osborne  has presented today for surgery, with the diagnosis of CERVICAL CANCER.  The various methods of treatment have been discussed with the patient and family. After consideration of risks, benefits and other options for treatment, the patient has consented to  Procedure(s): TANDEM RING INSERTION (N/A) OPERATIVE ULTRASOUND (N/A) as a surgical intervention.  The patient's history has been reviewed, patient examined, no change in status, stable for surgery.  I have reviewed the patient's chart and labs.  Questions were answered to the patient's satisfaction.     Gery Pray

## 2019-06-05 NOTE — Anesthesia Postprocedure Evaluation (Signed)
Anesthesia Post Note  Patient: Tamara Osborne  Procedure(s) Performed: TANDEM RING INSERTION (N/A Vagina ) OPERATIVE ULTRASOUND (N/A Abdomen)     Patient location during evaluation: PACU Anesthesia Type: General Level of consciousness: awake and alert Pain management: pain level controlled Vital Signs Assessment: post-procedure vital signs reviewed and stable Respiratory status: spontaneous breathing, nonlabored ventilation, respiratory function stable and patient connected to nasal cannula oxygen Cardiovascular status: blood pressure returned to baseline and stable Postop Assessment: no apparent nausea or vomiting Anesthetic complications: no    Last Vitals:  Vitals:   06/05/19 0815 06/05/19 0830  BP: (!) 150/88 (!) 159/95  Pulse: 88 91  Resp: 16 20  Temp:    SpO2: 100% 96%    Last Pain:  Vitals:   06/05/19 0830  TempSrc:   PainSc: 4                  [name] S

## 2019-06-05 NOTE — Addendum Note (Signed)
Encounter addended by: Loma Sousa, RN on: 06/05/2019 9:40 AM  Actions taken: Order list changed, Diagnosis association updated

## 2019-06-05 NOTE — Transfer of Care (Signed)
Immediate Anesthesia Transfer of Care Note  Patient: Tamara Osborne  Procedure(s) Performed: TANDEM RING INSERTION (N/A Vagina ) OPERATIVE ULTRASOUND (N/A Abdomen)  Patient Location: PACU  Anesthesia Type:General  Level of Consciousness: awake, alert , oriented and patient cooperative  Airway & Oxygen Therapy: Patient Spontanous Breathing and Patient connected to nasal cannula oxygen  Post-op Assessment: Report given to RN and Post -op Vital signs reviewed and stable  Post vital signs: Reviewed and stable  Last Vitals:  Vitals Value Taken Time  BP    Temp    Pulse 88 06/05/19 0813  Resp 15 06/05/19 0813  SpO2 100 % 06/05/19 0813  Vitals shown include unvalidated device data.  Last Pain:  Vitals:   06/05/19 0646  TempSrc: Oral  PainSc: 0-No pain      Patients Stated Pain Goal: 5 ([id number])  Complications: No apparent anesthesia complications

## 2019-06-05 NOTE — Addendum Note (Signed)
Encounter addended by: Loma Sousa, RN on: 06/05/2019 3:31 PM  Actions taken: LDA properties accepted

## 2019-06-05 NOTE — Discharge Instructions (Signed)

## 2019-06-05 NOTE — Addendum Note (Signed)
Encounter addended by: Loma Sousa, RN on: 06/05/2019 10:06 AM  Actions taken: MAR administration accepted

## 2019-06-05 NOTE — Anesthesia Procedure Notes (Signed)
Procedure Name: LMA Insertion Date/Time: 06/05/2019 7:38 AM Performed by: Lollie Sails, CRNA Pre-anesthesia Checklist: Patient identified, Emergency Drugs available, Suction available, Patient being monitored and Timeout performed Patient Re-evaluated:Patient Re-evaluated prior to induction Oxygen Delivery Method: Circle system utilized Preoxygenation: Pre-oxygenation with 100% oxygen Induction Type: IV induction LMA: LMA inserted LMA Size: 4.0 Number of attempts: 1 Placement Confirmation: positive ETCO2 and breath sounds checked- equal and bilateral Tube secured with: Tape Dental Injury: Teeth and Oropharynx as per pre-operative assessment

## 2019-06-05 NOTE — Op Note (Signed)
06/05/2019  8:30 AM  PATIENT:  Tamara Osborne  44 y.o. female  PRE-OPERATIVE DIAGNOSIS:  CERVICAL CANCER  POST-OPERATIVE DIAGNOSIS:  CERVICAL CANCER  PROCEDURE:  Procedure(s): TANDEM RING INSERTION (N/A) OPERATIVE ULTRASOUND (N/A)  SURGEON:  Surgeon(s) and Role:    * Gery Pray, MD - Primary  PHYSICIAN ASSISTANT:   ASSISTANTS: none  ANESTHESIA:   general  EBL:  5 mL   BLOOD ADMINISTERED:none  DRAINS: Urinary Catheter (Foley)   LOCAL MEDICATIONS USED:  NONE  SPECIMEN:  No Specimen  DISPOSITION OF SPECIMEN:  N/A  COUNTS:  YES  TOURNIQUET:  * No tourniquets in log *  DICTATION: the patient's Port-acath was usedfor IV access in light ofdifficulties with IV accessthe first brachytherapy treatment. Shewas taken to outpatientOR #1. Timeout was performed for the procedure, estimated length of the procedure and preoperative medications. Patient was prepped and draped in the usual sterile fashion. Betadine was not used in light of the patient's seafood allergy. Patient had a Foley catheter placed without difficulty. Later during the procedure the bladder was backfilled with approximately 200 cc of sterile saline for ultrasound imaging purposes. Once the patient was asleep,exam under anesthesia showed the cervical mass to be markedly decreased in size and is estimated to be approximately2.5x2.5cm in size. No parametrial extension noted,no furtherextension into the anterior vaginal wall. Patient proceeded to undergo dilation of the cervix. The uterus sounded to~10.0cm. The uterus was mildly anteverted. There was some erythema noted to the cervical region but no active bleeding noted. After dilation of the cervix the patient had a 60 mm cervical sleeve placed within the endocervical canal and endometrial canal. She then had placement of a 60 mm tandem with a 45 degree orientation. This was then followed by placement of a 45 degree ring with a large  shielding cap in place. Patient then had placement of a rectal paddle posteriorly. Excellent images were obtained confirming the placement of the tandem within the central portion of the lower uterus segmentand endocervical canal. Patient tolerated the procedure well. She was transferred to the recovery room in stable condition. Later in the day the patient will be transferred to radiation oncology for planning and herfifth and finalhigh-dose-rate treatment. She is to receive a dose of 5.5 Gy to the high risk clinical target volume.   PLAN OF CARE: Transferred to radiation oncology for planning and treatment  PATIENT DISPOSITION:  PACU - hemodynamically stable.   Delay start of Pharmacological VTE agent (>24hrs) due to surgical blood loss or risk of bleeding: not applicable

## 2019-06-12 ENCOUNTER — Telehealth: Payer: Self-pay

## 2019-06-12 ENCOUNTER — Other Ambulatory Visit: Payer: Self-pay

## 2019-06-12 ENCOUNTER — Encounter: Payer: Self-pay | Admitting: Hematology and Oncology

## 2019-06-12 ENCOUNTER — Inpatient Hospital Stay: Payer: Medicaid Other | Attending: Hematology and Oncology | Admitting: Hematology and Oncology

## 2019-06-12 ENCOUNTER — Inpatient Hospital Stay: Payer: Medicaid Other

## 2019-06-12 ENCOUNTER — Other Ambulatory Visit: Payer: Self-pay | Admitting: Hematology and Oncology

## 2019-06-12 DIAGNOSIS — N183 Chronic kidney disease, stage 3 unspecified: Secondary | ICD-10-CM | POA: Insufficient documentation

## 2019-06-12 DIAGNOSIS — D6489 Other specified anemias: Secondary | ICD-10-CM | POA: Insufficient documentation

## 2019-06-12 DIAGNOSIS — C539 Malignant neoplasm of cervix uteri, unspecified: Secondary | ICD-10-CM | POA: Diagnosis not present

## 2019-06-12 DIAGNOSIS — I129 Hypertensive chronic kidney disease with stage 1 through stage 4 chronic kidney disease, or unspecified chronic kidney disease: Secondary | ICD-10-CM | POA: Diagnosis not present

## 2019-06-12 DIAGNOSIS — I10 Essential (primary) hypertension: Secondary | ICD-10-CM | POA: Diagnosis not present

## 2019-06-12 DIAGNOSIS — D539 Nutritional anemia, unspecified: Secondary | ICD-10-CM | POA: Diagnosis not present

## 2019-06-12 LAB — COMPREHENSIVE METABOLIC PANEL
ALT: 25 U/L (ref 0–44)
AST: 17 U/L (ref 15–41)
Albumin: 3.4 g/dL — ABNORMAL LOW (ref 3.5–5.0)
Alkaline Phosphatase: 82 U/L (ref 38–126)
Anion gap: 9 (ref 5–15)
BUN: 17 mg/dL (ref 6–20)
CO2: 25 mmol/L (ref 22–32)
Calcium: 8.9 mg/dL (ref 8.9–10.3)
Chloride: 107 mmol/L (ref 98–111)
Creatinine, Ser: 1.28 mg/dL — ABNORMAL HIGH (ref 0.44–1.00)
GFR calc Af Amer: 59 mL/min — ABNORMAL LOW (ref >60)
GFR calc non Af Amer: 51 mL/min — ABNORMAL LOW (ref >60)
Glucose, Bld: 148 mg/dL — ABNORMAL HIGH (ref 70–99)
Potassium: 3.7 mmol/L (ref 3.5–5.1)
Sodium: 141 mmol/L (ref 135–145)
Total Bilirubin: 0.6 mg/dL (ref 0.3–1.2)
Total Protein: 6.8 g/dL (ref 6.5–8.1)

## 2019-06-12 LAB — CBC WITH DIFFERENTIAL (CANCER CENTER ONLY)
Abs Immature Granulocytes: 0.08 10*3/uL — ABNORMAL HIGH (ref 0.00–0.07)
Basophils Absolute: 0 10*3/uL (ref 0.0–0.1)
Basophils Relative: 0 %
Eosinophils Absolute: 0.1 10*3/uL (ref 0.0–0.5)
Eosinophils Relative: 2 %
HCT: 26.9 % — ABNORMAL LOW (ref 36.0–46.0)
Hemoglobin: 9.1 g/dL — ABNORMAL LOW (ref 12.0–15.0)
Immature Granulocytes: 1 %
Lymphocytes Relative: 5 %
Lymphs Abs: 0.3 10*3/uL — ABNORMAL LOW (ref 0.7–4.0)
MCH: 32 pg (ref 26.0–34.0)
MCHC: 33.8 g/dL (ref 30.0–36.0)
MCV: 94.7 fL (ref 80.0–100.0)
Monocytes Absolute: 0.4 10*3/uL (ref 0.1–1.0)
Monocytes Relative: 6 %
Neutro Abs: 5.7 10*3/uL (ref 1.7–7.7)
Neutrophils Relative %: 86 %
Platelet Count: 231 10*3/uL (ref 150–400)
RBC: 2.84 MIL/uL — ABNORMAL LOW (ref 3.87–5.11)
RDW: 20.6 % — ABNORMAL HIGH (ref 11.5–15.5)
WBC Count: 6.6 10*3/uL (ref 4.0–10.5)
nRBC: 0.3 % — ABNORMAL HIGH (ref 0.0–0.2)

## 2019-06-12 LAB — SAMPLE TO BLOOD BANK

## 2019-06-12 LAB — MAGNESIUM: Magnesium: 1.8 mg/dL (ref 1.7–2.4)

## 2019-06-12 MED ORDER — SODIUM CHLORIDE 0.9% FLUSH
10.0000 mL | Freq: Once | INTRAVENOUS | Status: AC
  Administered 2019-06-12: 10 mL
  Filled 2019-06-12: qty 10

## 2019-06-12 NOTE — Assessment & Plan Note (Signed)
She has multifactorial anemia Her vaginal bleeding has stopped Anemia is improving on her own Observe only for now

## 2019-06-12 NOTE — Telephone Encounter (Signed)
Per MD recommendations, pt to discontinue magnesium oxide due to WNL magnesium level.    Pt notified, voiced understanding and agreement.

## 2019-06-12 NOTE — Assessment & Plan Note (Signed)
Her renal function is stable/improving Continue hydration as tolerated and risk factor modification 

## 2019-06-12 NOTE — Progress Notes (Incomplete)
  Patient Name: Tamara Osborne MRN: [id number] DOB: [birth year] Referring Physician: Jacinto [facility] (Profile Not Attached) Date of Service: 05/04/2019 [facility] [place]                                                        End Of Treatment Note  Diagnoses: C53.9-Malignant neoplasm of cervix uteri, unspecified  Cancer Staging: Stage II-B squamous cell carcinoma of the cervix  Intent: Curative  Radiation Treatment Dates: 03/23/2019 through 05/04/2019 Site Technique Total Dose (Gy) Dose per Fx (Gy) Completed Fx Beam Energies  Cervix: Cervix 3D 45/45 1.8 25/25 10X, 15X  Cervix: Cervix_Bst 3D 9/9 1.8 5/5 15X   Narrative: The patient tolerated radiation therapy relatively well. The patient did report some vaginal bleeding, abdominal cramping, moderate fatigue, occasional burning with urination, and diarrhea. Diarrhea was relieved with Imodium and other symptoms improved throughout treatment. She denied nausea, vomiting, dysuria, and back pain. She was receiving transfusions throughout treatment because her hemoglobin was low, which did improve.  She was undergoing concurrent radiosensitizing chemotherapy and was tolerating it well with the exception of constipation.  Of note, she did have a PET scan on 03/28/2019 that showed a hypermetabolic cervical mass measuring roughly 7.6 x 6.9 x 4.9 cm, compatible with malignancy. No adenopathy or distant metastatic spread was identified. Extending cephalad and anterior to the fundus, there was a 450 cubic cm simple fluid density structure without hypermetabolic activity, which may represent a right adnexal cyst (favored), peritoneal inclusion cyst, or a remote pseudocyst if the patient has a remote history of pancreatitis. Finally, there was faint ground density nodularity in both lower lobe,  which could be from atypical infection or extrinsic allergic alveolitis.  Plan: The patient has completed external beam therapy and will begin HDR brachytherapy on 05/09/2019.  ________________________________________________   Blair Promise, PhD, MD  This document serves as a record of services personally performed by Gery Pray, MD. It was created on his behalf by Clerance Lav, a trained medical scribe. The creation of this record is based on the scribe's personal observations and the provider's statements to them. This document has been checked and approved by the attending provider.

## 2019-06-12 NOTE — Progress Notes (Signed)
[place] OFFICE PROGRESS NOTE  Patient Care Team: [place], [place] as PCP - General (Family Medicine)  ASSESSMENT & PLAN:  Squamous cell carcinoma of cervix ([place]) Overall, she is recovering nicely since she finished all her treatment She has appointment to come back next month to see radiation oncologist I will see her in about 2 months with blood work and port flushes I will order her restaging PET CT scan after I see her in June, to be done around July In the meantime, she will focus on supportive care  CKD (chronic kidney disease), symptom management only, stage 3 (moderate) Her renal function is stable/improving Continue hydration as tolerated and risk factor modification  Essential hypertension She has significant elevated blood pressure but she only took her blood pressure pill recently I recommend close follow-up with her primary care doctor for medical management  Deficiency anemia She has multifactorial anemia Her vaginal bleeding has stopped Anemia is improving on her own Observe only for now   No orders of the defined types were placed in this encounter.   All questions were answered. The patient knows to call the clinic with any problems, questions or concerns. The total time spent in the appointment was 20 minutes encounter with patients including review of chart and various tests results, discussions about plan of care and coordination of care plan   [name], MD 06/12/2019 11:45 AM  INTERVAL HISTORY: Please see below for problem oriented charting. She returns for further follow-up She complained of fatigue No further vaginal bleeding Her blood pressure is elevated but she is not symptomatic.  She just took her blood pressure pill several minutes ago She had very mild residual neuropathy but it does not bother her Her anxiety is stable She continues to have occasional loose bowel movement No recent nausea  SUMMARY OF  ONCOLOGIC HISTORY: Oncology History  Squamous cell carcinoma of cervix ([place])  03/21/2019 Pathology Results   The biopsy is superficial and therefore depth of invasion cannot be  determined.  There is at least carcinoma in-situ.    03/23/2019 Initial Diagnosis   Squamous cell carcinoma of cervix ([place])   03/23/2019 Pathology Results   CERVIX, 11 O CLOCK, BIOPSY:  -  Squamous cell carcinoma, invasive  -  See comment   03/28/2019 PET scan   1. Hypermetabolic cervical mass measures roughly 7.6 by 6.9 by 4.9 cm, compatible with malignancy. No adenopathy or distant metastatic spread identified. 2. Extending cephalad and anterior to the uterine fundus, there is a 450 cubic cm simple fluid density structure without hypermetabolic activity. This may represent a right adnexal cyst (favored), peritoneal inclusion cyst, or (if the patient has a remote history of pancreatitis) a remote pseudocyst. 3. Faint ground density nodularity in both lower lobes which could be from atypical infection or extrinsic allergic alveolitis. Given nodular appearance of some of this ground-glass density, I would recommend follow up chest CT in 3 months time in order to ensure clearance. 4. Moderate cardiomegaly, cause uncertain. The patient also has advanced for age/gender atherosclerosis. Aortic Atherosclerosis (ICD10-I70.0).     04/04/2019 Procedure   Successful placement of a right internal jugular approach power injectable Port-A-Cath. The catheter is ready for immediate use   04/07/2019 -  Chemotherapy   The patient had weekly cisplatin for chemotherapy treatment.       REVIEW OF SYSTEMS:   Constitutional: Denies fevers, chills or abnormal weight loss Eyes: Denies blurriness of vision Ears, nose, mouth, throat, and  face: Denies mucositis or sore throat Respiratory: Denies cough, dyspnea or wheezes Cardiovascular: Denies palpitation, chest discomfort or lower extremity swelling Skin: Denies abnormal skin  rashes Lymphatics: Denies new lymphadenopathy or easy bruising Behavioral/Psych: Mood is stable, no new changes  All other systems were reviewed with the patient and are negative.  I have reviewed the past medical history, past surgical history, social history and family history with the patient and they are unchanged from previous note.  ALLERGIES:  is allergic to penicillins; shellfish allergy; and sulfa antibiotics.  MEDICATIONS:  Current Outpatient Medications  Medication Sig Dispense Refill  . acetaminophen (TYLENOL) 500 MG tablet Take 500 mg by mouth every 6 (six) hours as needed for headache.    Marland Kitchen amLODipine (NORVASC) 10 MG tablet Take 1 tablet (10 mg total) by mouth daily. (Patient taking differently: Take 10 mg by mouth every evening. ) 90 tablet 0  . cloNIDine (CATAPRES) 0.1 MG tablet Take 1 tablet (0.1 mg total) by mouth 2 (two) times daily as needed (blood pressure >180/110). 50 tablet 0  . diphenhydramine-acetaminophen (TYLENOL PM) 25-500 MG TABS tablet Take 1 tablet by mouth at bedtime as needed.    . fluticasone (FLONASE) 50 MCG/ACT nasal spray Place 1 spray into both nostrils daily as needed for allergies or rhinitis.    . hydrOXYzine (ATARAX/VISTARIL) 25 MG tablet Take 1 tablet (25 mg total) by mouth every 6 (six) hours as needed for anxiety. 20 tablet 0  . lidocaine-prilocaine (EMLA) cream Apply to affected area once 30 g 3  . loperamide (IMODIUM) 2 MG capsule Take by mouth as needed for diarrhea or loose stools.    Marland Kitchen LORazepam (ATIVAN) 0.5 MG tablet Take 1 tablet (0.5 mg total) by mouth every 8 (eight) hours as needed for anxiety. 30 tablet 0  . prochlorperazine (COMPAZINE) 10 MG tablet Take 1 tablet (10 mg total) by mouth every 6 (six) hours as needed (Nausea or vomiting). 30 tablet 1  . senna-docusate (SENOKOT-S) 8.6-50 MG tablet Take 2 tablets by mouth at bedtime. To prevent constipation, do not take if having loose stools 60 tablet 1   No current facility-administered  medications for this visit.    PHYSICAL EXAMINATION: ECOG PERFORMANCE STATUS: 1 - Symptomatic but completely ambulatory  Vitals:   06/12/19 1141  BP: (!) 186/100  Pulse: (!) 110  Resp: 18  Temp: 98.9 F (37.2 C)  SpO2: 100%   Filed Weights   06/12/19 1141  Weight: 207 lb 12.8 oz (94.3 kg)    GENERAL:alert, no distress and comfortable NEURO: alert & oriented x 3 with fluent speech, no focal motor/sensory deficits  LABORATORY DATA:  I have reviewed the data as listed    Component Value Date/Time   NA 141 06/12/2019 1059   K 3.7 06/12/2019 1059   CL 107 06/12/2019 1059   CO2 25 06/12/2019 1059   GLUCOSE 148 (H) 06/12/2019 1059   BUN 17 06/12/2019 1059   CREATININE 1.28 (H) 06/12/2019 1059   CALCIUM 8.9 06/12/2019 1059   PROT 6.8 06/12/2019 1059   ALBUMIN 3.4 (L) 06/12/2019 1059   AST 17 06/12/2019 1059   ALT 25 06/12/2019 1059   ALKPHOS 82 06/12/2019 1059   BILITOT 0.6 06/12/2019 1059   GFRNONAA 51 (L) 06/12/2019 1059   GFRAA 59 (L) 06/12/2019 1059    No results found for: SPEP, UPEP  Lab Results  Component Value Date   WBC 6.6 06/12/2019   NEUTROABS 5.7 06/12/2019   HGB 9.1 (L) 06/12/2019  HCT 26.9 (L) 06/12/2019   MCV 94.7 06/12/2019   PLT 231 06/12/2019      Chemistry      Component Value Date/Time   NA 141 06/12/2019 1059   K 3.7 06/12/2019 1059   CL 107 06/12/2019 1059   CO2 25 06/12/2019 1059   BUN 17 06/12/2019 1059   CREATININE 1.28 (H) 06/12/2019 1059      Component Value Date/Time   CALCIUM 8.9 06/12/2019 1059   ALKPHOS 82 06/12/2019 1059   AST 17 06/12/2019 1059   ALT 25 06/12/2019 1059   BILITOT 0.6 06/12/2019 1059       RADIOGRAPHIC STUDIES: I have personally reviewed the radiological images as listed and agreed with the findings in the report. [place] Intraoperative  Result Date: 06/05/2019 CLINICAL DATA:  Ultrasound was provided for use by the ordering physician, and a technical charge was applied by the performing facility.  No  radiologist interpretation/professional services rendered.   [place] Intraoperative  Result Date: 05/29/2019 CLINICAL DATA:  Ultrasound was provided for use by the ordering physician, and a technical charge was applied by the performing facility.  No radiologist interpretation/professional services rendered.   [place] Intraoperative  Result Date: 05/22/2019 CLINICAL DATA:  Ultrasound was provided for use by the ordering physician, and a technical charge was applied by the performing facility.  No radiologist interpretation/professional services rendered.   [place] Intraoperative  Result Date: 05/15/2019 CLINICAL DATA:  Ultrasound was provided for use by the ordering physician, and a technical charge was applied by the performing facility.  No radiologist interpretation/professional services rendered.

## 2019-06-12 NOTE — Assessment & Plan Note (Signed)
Overall, she is recovering nicely since she finished all her treatment She has appointment to come back next month to see radiation oncologist I will see her in about 2 months with blood work and port flushes I will order her restaging PET CT scan after I see her in June, to be done around July In the meantime, she will focus on supportive care

## 2019-06-12 NOTE — Assessment & Plan Note (Signed)
She has significant elevated blood pressure but she only took her blood pressure pill recently I recommend close follow-up with her primary care doctor for medical management

## 2019-06-13 ENCOUNTER — Telehealth: Payer: Self-pay | Admitting: Hematology and Oncology

## 2019-06-13 NOTE — Telephone Encounter (Signed)
Scheduled appts per 4/12 sch msg. Pt confirmed appt date and time.

## 2019-06-30 NOTE — Progress Notes (Signed)
Counseling intern called patient on 06/30/19 to inform pt that today is my last day providing counseling services at [facility]. CI encouraged pt to continue engaging in support programming through [facility] and the [employer] [facility]. Pt voiced understanding and reported she intends to continue signing up for the programs each week because she finds them beneficial. Pt also shared that she received approval for her medicaid coverage a few weeks ago and plans to reach out to a counselor in the community to continue working on processing her past experiences. CI will e-mail the pt a list of some providers in the community to consider.   Art Buff Romeo Counseling Intern Voicemail:  [phone]

## 2019-07-05 NOTE — Progress Notes (Incomplete)
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 She underwent external beam therapy from 03/23/2019 - 05/04/2019 (see separate end of treatment note). She began HDR vaginal brachytherapy on 05/19/2019 without significant complaints. There was minimal blood discharged noted with post-treatment equipment removal.  Plan: The patient has completed her definitive course of radiation along with radiosensitizing chemotherapy. She will follow-up with radiation oncology in one month.  ________________________________________________   Blair Promise, PhD, MD  This document serves as a record of services personally performed by Gery Pray, MD. It was created on his behalf by Clerance Lav, a trained medical scribe. The creation of this record is based on the scribe's personal observations and the provider's statements to them. This document has been  checked and approved by the attending provider.

## 2019-07-05 NOTE — Progress Notes (Signed)
Tamara Osborne presents today for 1 month follow-up appointment after completing brachytherapy (tandem and ring) to cervix on 06/05/2019  Denies any pain Fatigue: Moderate  N/V/D: reports no N/V c/o diarrhea  Bladder issues: incontinence issues Vaginal or rectal bleeding spotting occassionally Skin irritation: none noted  Other notable issues, if any:  Saw Dr. Alvy Bimler on 06/12/2019: "Overall, she is recovering nicely since she finished all her treatment. I will see her in about 2 months with blood work and port flushes. I will order her restaging PET CT scan after I see her in June, to be done around July. In the meantime, she will focus on supportive care"  Blood pressure (!) 177/95, pulse 100, temperature 97.6 F (36.4 C), resp. rate 20, height 5\' 2"  (1.575 m), weight 211 lb 12.8 oz (96.1 kg), SpO2 100 %.  Filed Weights   07/06/19 1020  Weight: 211 lb 12.8 oz (96.1 kg)    Home Care Instructions for the Insertion and Care of Your Vaginal Dilator  Why Do I Need a Vaginal Dilator?  Internal radiation therapy may cause scar tissue to form at the top of your vagina (vaginal cuff).  This may make vaginal examinations difficult in the future. You can prevent scar tissue from forming by using a vaginal dilator (a smooth plastic rod), and/or by having regular sexual intercourse.  If not using the dilator you should be having intercourse two or three times a week.  If you are unable to have intercourse, you should use your vaginal dilator.  You may have some spotting or bleeding from your dilator or intercourse the first few times. You may also have some discomfort. If discomfort occurs with intercourse, you and your partner may need to stop for a while and try again later.  How to Use Your Vaginal Dilator  - Wash the dilator with soap and water before and after each use. - Check the dilator to be sure it is smooth. Do not use the dilator if you find any roughspots. - Coat the dilator with  K-Y Jelly, Astroglide, or Replens. Do not use Vaseline, baby oil, or other oil based lubricants. They are not water-soluble and can be irritating to the tissues in the vagina. - Lie on your back with your knees bent and legs apart. - Insert the rounded end of the dilator into your vagina as far as it will go without causing pain or discomfort. - Close your knees and slowly straighten your legs. - Keep the dilator in your vagina for about 10 to 15 minutes.  Please use 3 times a week, for example: Monday, Wednesday and Friday evenings. [facility] your knees, open your legs, and gently remove the dilator. - Gently cleanse the skin around the vaginal opening. - Wash the dilator after each use. -  It is important that you use the dilator routinely until instructed otherwise by your doctor.

## 2019-07-05 NOTE — Progress Notes (Addendum)
Radiation Oncology         [phone] ________________________________  Name: Tamara Osborne MRN: [id number]  Date: 07/06/2019  DOB: [birth year]  Follow-Up Visit Note  CC: [facility]    ICD-10-CM   1. 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  Radiation Treatment Dates: 03/23/2019 through 06/05/2019   External beam therapy was from 03/23/2019 - 05/04/2019. HDR vaginal brachytherapy was from 05/19/2019 - 06/05/2019.  Site Technique Total Dose (Gy) Dose per Fx (Gy) Completed Fx Beam Energies  Cervix: Cervix HDR-brachy 5.5/5.5 5.5 1/1 Ir-192  Cervix: Cervix HDR-brachy 5.5/5.5 5.5 1/1 Ir-192  Cervix: Cervix_Bst HDR-brachy 5.5/5.5 5.5 1/1 Ir-192  Cervix: Cervix HDR-brachy 5.5/5.5 5.5 1/1 Ir-192  Cervix: Cervix 3D 45/45 1.8 25/25 10X, 15X  Cervix: Cervix HDR-brachy 5.5/5.5 5.5 1/1 Ir-192  Cervix: Cervix_Bst 3D 9/9 1.8 5/5 15X    Narrative:  The patient returns today for routine follow-up.  Since the end of treatment, the patient followed-up with Dr. Alvy Bimler on 06/12/2019. At that time, the patient was noted to be recovering well.  On review of systems, she reports moderate fatigue, diarrhea, and occasional spotting. She denies nausea, vomiting, pain, and skin irritation.  She denies any pelvic pain or abdominal bloating.  She has not required any transfusions since completion of her brachytherapy  ALLERGIES:  is allergic to penicillins; shellfish allergy; and sulfa antibiotics.  Meds: Current Outpatient Medications  Medication Sig Dispense Refill  . acetaminophen (TYLENOL) 500 MG tablet Take 500 mg by mouth every 6 (six) hours as needed for headache.    . cloNIDine (CATAPRES) 0.1 MG tablet Take 1 tablet (0.1 mg total) by mouth 2 (two) times daily as needed  (blood pressure >180/110). 50 tablet 0  . diphenhydramine-acetaminophen (TYLENOL PM) 25-500 MG TABS tablet Take 1 tablet by mouth at bedtime as needed.    . fluticasone (FLONASE) 50 MCG/ACT nasal spray Place 1 spray into both nostrils daily as needed for allergies or rhinitis.    Marland Kitchen lidocaine-prilocaine (EMLA) cream Apply to affected area once 30 g 3  . prochlorperazine (COMPAZINE) 10 MG tablet Take 1 tablet (10 mg total) by mouth every 6 (six) hours as needed (Nausea or vomiting). 30 tablet 1  . amLODipine (NORVASC) 10 MG tablet Take 1 tablet (10 mg total) by mouth daily. (Patient taking differently: Take 10 mg by mouth every evening. ) 90 tablet 0  . hydrOXYzine (ATARAX/VISTARIL) 25 MG tablet Take 1 tablet (25 mg total) by mouth every 6 (six) hours as needed for anxiety. (Patient not taking: Reported on 07/06/2019) 20 tablet 0  . loperamide (IMODIUM) 2 MG capsule Take by mouth as needed for diarrhea or loose stools.    Marland Kitchen LORazepam (ATIVAN) 0.5 MG tablet Take 1 tablet (0.5 mg total) by mouth every 8 (eight) hours as needed for anxiety. (Patient not taking: Reported on 07/06/2019) 30 tablet 0  . senna-docusate (SENOKOT-S) 8.6-50 MG tablet Take 2 tablets by mouth at bedtime. To prevent constipation, do not take if having loose stools (Patient not taking: Reported on 07/06/2019) 60 tablet 1   No current facility-administered medications for this encounter.    Physical Findings: The patient is in no acute distress. Patient is alert and oriented.  height is 5\' 2"  (1.575 m) and weight is 211  lb 12.8 oz (96.1 kg). Her temperature is 97.6 F (36.4 C). Her blood pressure is 177/95 (abnormal) and her pulse is 100. Her respiration is 20 and oxygen saturation is 100%.   No significant changes. Lungs are clear to auscultation bilaterally. Heart has regular rate and rhythm. No palpable cervical, supraclavicular, or axillary adenopathy. Abdomen soft, non-tender, normal bowel sounds. Pelvic exam deferred in light of  recent completion of treatment  Lab Findings: Lab Results  Component Value Date   WBC 6.6 06/12/2019   HGB 9.1 (L) 06/12/2019   HCT 26.9 (L) 06/12/2019   MCV 94.7 06/12/2019   PLT 231 06/12/2019    Radiographic Findings: No results found.  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  Patient denies any pain.  She denies any vaginal bleeding at this time.  She continues to have fatigue but overall is feeling much better compared to pretreatment.  Patient does have some dysuria.  She is also developed some urge and stress urinary incontinence.  In light of the symptoms she will present to the lab for urinalysis culture and sensitivity.  Plan: The patient is scheduled to follow-up with Dr. Alvy Bimler on 08/07/2019. A PET scan will be ordered after that visit to be scheduled in July.  Additional therapy will depend on results of this PET scan. follow-up with radiation oncology in 5 months.  Patient will follow up with Dr. Berline Lopes in 2 months.. Patient was given a vaginal dilator and instructions on its use.  ____________________________________   Blair Promise, PhD, MD  This document serves as a record of services personally performed by Gery Pray, MD. It was created on his behalf by Clerance Lav, a trained medical scribe. The creation of this record is based on the scribe's personal observations and the provider's statements to them. This document has been checked and approved by the attending provider.

## 2019-07-06 ENCOUNTER — Inpatient Hospital Stay: Payer: Medicaid Other

## 2019-07-06 ENCOUNTER — Ambulatory Visit
Admission: RE | Admit: 2019-07-06 | Discharge: 2019-07-06 | Disposition: A | Discharge status: Home / Self Care | Payer: Medicaid Other | Source: Ambulatory Visit | Attending: Radiation Oncology | Admitting: Radiation Oncology

## 2019-07-06 ENCOUNTER — Other Ambulatory Visit: Payer: Self-pay

## 2019-07-06 ENCOUNTER — Encounter: Payer: Self-pay | Admitting: Oncology

## 2019-07-06 ENCOUNTER — Encounter: Payer: Self-pay | Admitting: Radiation Oncology

## 2019-07-06 VITALS — BP 177/95 | HR 100 | Temp 97.6°F | Resp 20 | Ht 62.0 in | Wt 211.8 lb

## 2019-07-06 DIAGNOSIS — C539 Malignant neoplasm of cervix uteri, unspecified: Secondary | ICD-10-CM | POA: Insufficient documentation

## 2019-07-06 DIAGNOSIS — Z923 Personal history of irradiation: Secondary | ICD-10-CM | POA: Insufficient documentation

## 2019-07-06 DIAGNOSIS — Z79899 Other long term (current) drug therapy: Secondary | ICD-10-CM | POA: Diagnosis not present

## 2019-07-06 DIAGNOSIS — R300 Dysuria: Secondary | ICD-10-CM | POA: Insufficient documentation

## 2019-07-06 DIAGNOSIS — R5383 Other fatigue: Secondary | ICD-10-CM | POA: Insufficient documentation

## 2019-07-06 DIAGNOSIS — R197 Diarrhea, unspecified: Secondary | ICD-10-CM | POA: Insufficient documentation

## 2019-07-06 LAB — URINALYSIS, COMPLETE (UACMP) WITH MICROSCOPIC
Bilirubin Urine: NEGATIVE
Glucose, UA: NEGATIVE mg/dL
Ketones, ur: NEGATIVE mg/dL
Nitrite: NEGATIVE
Protein, ur: >=300 mg/dL — AB
Specific Gravity, Urine: 1.02 (ref 1.005–1.030)
WBC, UA: >50 WBC/hpf — ABNORMAL HIGH (ref 0–5)
pH: 5 (ref 5.0–8.0)

## 2019-07-06 NOTE — Addendum Note (Signed)
Encounter addended by: Gery Pray, MD on: 07/06/2019 5:57 PM  Actions taken: Clinical Note Signed

## 2019-07-06 NOTE — Progress Notes (Signed)
Provided patient with dilators with extra small, small and medium sizes per Dr. Sondra Come. Dilator teaching done. Pt.  Verbalized understanding.

## 2019-07-07 LAB — URINE CULTURE: Culture: <10000 — AB

## 2019-08-07 ENCOUNTER — Telehealth: Payer: Self-pay

## 2019-08-07 ENCOUNTER — Encounter: Payer: Self-pay | Admitting: Hematology and Oncology

## 2019-08-07 ENCOUNTER — Inpatient Hospital Stay (HOSPITAL_BASED_OUTPATIENT_CLINIC_OR_DEPARTMENT_OTHER): Payer: Medicaid Other | Admitting: Hematology and Oncology

## 2019-08-07 ENCOUNTER — Other Ambulatory Visit: Payer: Self-pay

## 2019-08-07 ENCOUNTER — Inpatient Hospital Stay: Payer: Medicaid Other | Attending: Hematology and Oncology

## 2019-08-07 ENCOUNTER — Inpatient Hospital Stay: Payer: Medicaid Other

## 2019-08-07 VITALS — BP 163/93 | HR 92 | Temp 98.3°F | Resp 18 | Ht 62.0 in | Wt 216.8 lb

## 2019-08-07 DIAGNOSIS — I129 Hypertensive chronic kidney disease with stage 1 through stage 4 chronic kidney disease, or unspecified chronic kidney disease: Secondary | ICD-10-CM | POA: Insufficient documentation

## 2019-08-07 DIAGNOSIS — G620 Drug-induced polyneuropathy: Secondary | ICD-10-CM | POA: Diagnosis not present

## 2019-08-07 DIAGNOSIS — M7989 Other specified soft tissue disorders: Secondary | ICD-10-CM | POA: Insufficient documentation

## 2019-08-07 DIAGNOSIS — Z9221 Personal history of antineoplastic chemotherapy: Secondary | ICD-10-CM | POA: Diagnosis not present

## 2019-08-07 DIAGNOSIS — N183 Chronic kidney disease, stage 3 unspecified: Secondary | ICD-10-CM

## 2019-08-07 DIAGNOSIS — C539 Malignant neoplasm of cervix uteri, unspecified: Secondary | ICD-10-CM | POA: Diagnosis present

## 2019-08-07 DIAGNOSIS — I10 Essential (primary) hypertension: Secondary | ICD-10-CM

## 2019-08-07 DIAGNOSIS — Z923 Personal history of irradiation: Secondary | ICD-10-CM | POA: Insufficient documentation

## 2019-08-07 DIAGNOSIS — T451X5A Adverse effect of antineoplastic and immunosuppressive drugs, initial encounter: Secondary | ICD-10-CM

## 2019-08-07 LAB — CBC WITH DIFFERENTIAL/PLATELET
Abs Immature Granulocytes: 0.04 10*3/uL (ref 0.00–0.07)
Basophils Absolute: 0 10*3/uL (ref 0.0–0.1)
Basophils Relative: 0 %
Eosinophils Absolute: 0.9 10*3/uL — ABNORMAL HIGH (ref 0.0–0.5)
Eosinophils Relative: 13 %
HCT: 34.8 % — ABNORMAL LOW (ref 36.0–46.0)
Hemoglobin: 11.8 g/dL — ABNORMAL LOW (ref 12.0–15.0)
Immature Granulocytes: 1 %
Lymphocytes Relative: 8 %
Lymphs Abs: 0.5 10*3/uL — ABNORMAL LOW (ref 0.7–4.0)
MCH: 29.7 pg (ref 26.0–34.0)
MCHC: 33.9 g/dL (ref 30.0–36.0)
MCV: 87.7 fL (ref 80.0–100.0)
Monocytes Absolute: 0.4 10*3/uL (ref 0.1–1.0)
Monocytes Relative: 7 %
Neutro Abs: 4.7 10*3/uL (ref 1.7–7.7)
Neutrophils Relative %: 71 %
Platelets: 267 10*3/uL (ref 150–400)
RBC: 3.97 MIL/uL (ref 3.87–5.11)
RDW: 12.8 % (ref 11.5–15.5)
WBC: 6.6 10*3/uL (ref 4.0–10.5)
nRBC: 0 % (ref 0.0–0.2)

## 2019-08-07 LAB — COMPREHENSIVE METABOLIC PANEL
ALT: 27 U/L (ref 0–44)
AST: 15 U/L (ref 15–41)
Albumin: 3.6 g/dL (ref 3.5–5.0)
Alkaline Phosphatase: 74 U/L (ref 38–126)
Anion gap: 12 (ref 5–15)
BUN: 23 mg/dL — ABNORMAL HIGH (ref 6–20)
CO2: 23 mmol/L (ref 22–32)
Calcium: 9.1 mg/dL (ref 8.9–10.3)
Chloride: 106 mmol/L (ref 98–111)
Creatinine, Ser: 1.49 mg/dL — ABNORMAL HIGH (ref 0.44–1.00)
GFR calc Af Amer: 49 mL/min — ABNORMAL LOW (ref >60)
GFR calc non Af Amer: 43 mL/min — ABNORMAL LOW (ref >60)
Glucose, Bld: 161 mg/dL — ABNORMAL HIGH (ref 70–99)
Potassium: 4.1 mmol/L (ref 3.5–5.1)
Sodium: 141 mmol/L (ref 135–145)
Total Bilirubin: 0.4 mg/dL (ref 0.3–1.2)
Total Protein: 7.2 g/dL (ref 6.5–8.1)

## 2019-08-07 NOTE — Assessment & Plan Note (Signed)
Her renal function is stable/improving Continue hydration as tolerated and risk factor modification

## 2019-08-07 NOTE — Assessment & Plan Note (Signed)
Neuropathy has improved dramatically since last time I saw her Observe for now

## 2019-08-07 NOTE — Assessment & Plan Note (Signed)
Overall, she has excellent recovery of her pancytopenia and other side effects of treatment I recommend PET CT scan for objective assessment of response to therapy next month She is in agreement to proceed

## 2019-08-07 NOTE — Assessment & Plan Note (Signed)
She has significant elevated blood pressure   She has not been watching out on her diet We discussed the importance of adequate hydration and dietary modification I recommend close follow-up with her primary care doctor for medical management

## 2019-08-07 NOTE — Progress Notes (Signed)
[place] OFFICE PROGRESS NOTE  Patient Care Team: [place], [place] as PCP - General (Family Medicine)  ASSESSMENT & PLAN:  Squamous cell carcinoma of cervix ([place]) Overall, she has excellent recovery of her pancytopenia and other side effects of treatment I recommend PET CT scan for objective assessment of response to therapy next month She is in agreement to proceed  CKD (chronic kidney disease), symptom management only, stage 3 (moderate) Her renal function is stable/improving Continue hydration as tolerated and risk factor modification  Essential hypertension She has significant elevated blood pressure   She has not been watching out on her diet We discussed the importance of adequate hydration and dietary modification I recommend close follow-up with her primary care doctor for medical management  Peripheral neuropathy due to chemotherapy [facility]) Neuropathy has improved dramatically since last time I saw her Observe for now   Orders Placed This Encounter  Procedures  . NM PET Image Restag (PS) Skull Base To Thigh    Standing Status:   Future    Standing Expiration Date:   08/06/2020    Order Specific Question:   If indicated for the ordered procedure, I authorize the administration of a radiopharmaceutical per Radiology protocol    Answer:   Yes    Order Specific Question:   Preferred imaging location?    Answer:   [facility]    Order Specific Question:   Radiology Contrast Protocol - do NOT remove file path    Answer:   [email or web address]    Order Specific Question:   Is the patient pregnant?    Answer:   No    All questions were answered. The patient knows to call the clinic with any problems, questions or concerns. The total time spent in the appointment was 20 minutes encounter with patients including review of chart and various tests results, discussions about plan of care and coordination of care  plan   [name], MD 08/07/2019 9:34 AM  INTERVAL HISTORY: Please see below for problem oriented charting. She returns for chemotherapy follow-up in recent treatment for cervical cancer She is doing well She have intermittent vaginal discharge but no bleeding Her neuropathy is improving dramatically in her tips of fingers She still has neuropathy in her feet but she continues to improve She had mild bilateral swelling of lower extremity She has gained a lot of weight recently She does not check her blood pressure regularly at home SUMMARY OF ONCOLOGIC HISTORY: Oncology History  Squamous cell carcinoma of cervix ([place])  03/21/2019 Pathology Results   The biopsy is superficial and therefore depth of invasion cannot be  determined.  There is at least carcinoma in-situ.    03/23/2019 Initial Diagnosis   Squamous cell carcinoma of cervix ([place])   03/23/2019 Pathology Results   CERVIX, 11 O CLOCK, BIOPSY:  -  Squamous cell carcinoma, invasive  -  See comment   03/23/2019 - 06/05/2019 Radiation Therapy   External beam therapy was from 03/23/2019 - 05/04/2019. HDR vaginal brachytherapy was from 05/19/2019 - 06/05/2019.   Site Technique Total Dose (Gy) Dose per Fx (Gy) Completed Fx Beam Energies  Cervix: Cervix HDR-brachy 5.5/5.5 5.5 1/1 Ir-192  Cervix: Cervix HDR-brachy 5.5/5.5 5.5 1/1 Ir-192  Cervix: Cervix_Bst HDR-brachy 5.5/5.5 5.5 1/1 Ir-192  Cervix: Cervix HDR-brachy 5.5/5.5 5.5 1/1 Ir-192  Cervix: Cervix 3D 45/45 1.8 25/25 10X, 15X  Cervix: Cervix HDR-brachy 5.5/5.5 5.5 1/1 Ir-192  Cervix: Cervix_Bst 3D 9/9 1.8 5/5 15X  03/28/2019 PET scan   1. Hypermetabolic cervical mass measures roughly 7.6 by 6.9 by 4.9 cm, compatible with malignancy. No adenopathy or distant metastatic spread identified. 2. Extending cephalad and anterior to the uterine fundus, there is a 450 cubic cm simple fluid density structure without hypermetabolic activity. This may represent a right adnexal cyst  (favored), peritoneal inclusion cyst, or (if the patient has a remote history of pancreatitis) a remote pseudocyst. 3. Faint ground density nodularity in both lower lobes which could be from atypical infection or extrinsic allergic alveolitis. Given nodular appearance of some of this ground-glass density, I would recommend follow up chest CT in 3 months time in order to ensure clearance. 4. Moderate cardiomegaly, cause uncertain. The patient also has advanced for age/gender atherosclerosis. Aortic Atherosclerosis (ICD10-I70.0).     04/04/2019 Procedure   Successful placement of a right internal jugular approach power injectable Port-A-Cath. The catheter is ready for immediate use   04/07/2019 - 05/12/2019 Chemotherapy   The patient had weekly cisplatin for chemotherapy treatment.       REVIEW OF SYSTEMS:   Constitutional: Denies fevers, chills or abnormal weight loss Eyes: Denies blurriness of vision Ears, nose, mouth, throat, and face: Denies mucositis or sore throat Respiratory: Denies cough, dyspnea or wheezes Cardiovascular: Denies palpitation, chest discomfort or lower extremity swelling Gastrointestinal:  Denies nausea, heartburn or change in bowel habits Skin: Denies abnormal skin rashes Lymphatics: Denies new lymphadenopathy or easy bruising Behavioral/Psych: Mood is stable, no new changes  All other systems were reviewed with the patient and are negative.  I have reviewed the past medical history, past surgical history, social history and family history with the patient and they are unchanged from previous note.  ALLERGIES:  is allergic to penicillins; shellfish allergy; and sulfa antibiotics.  MEDICATIONS:  Current Outpatient Medications  Medication Sig Dispense Refill  . acetaminophen (TYLENOL) 500 MG tablet Take 500 mg by mouth every 6 (six) hours as needed for headache.    Marland Kitchen amLODipine (NORVASC) 10 MG tablet Take 1 tablet (10 mg total) by mouth daily. (Patient taking  differently: Take 10 mg by mouth every evening. ) 90 tablet 0  . cloNIDine (CATAPRES) 0.1 MG tablet Take 1 tablet (0.1 mg total) by mouth 2 (two) times daily as needed (blood pressure >180/110). 50 tablet 0  . fluticasone (FLONASE) 50 MCG/ACT nasal spray Place 1 spray into both nostrils daily as needed for allergies or rhinitis.    Marland Kitchen lidocaine-prilocaine (EMLA) cream Apply to affected area once 30 g 3  . prochlorperazine (COMPAZINE) 10 MG tablet Take 1 tablet (10 mg total) by mouth every 6 (six) hours as needed (Nausea or vomiting). 30 tablet 1   No current facility-administered medications for this visit.    PHYSICAL EXAMINATION: ECOG PERFORMANCE STATUS: 1 - Symptomatic but completely ambulatory  Vitals:   08/07/19 0839  BP: (!) 163/93  Pulse: 92  Resp: 18  Temp: 98.3 F (36.8 C)  SpO2: 100%   Filed Weights   08/07/19 0839  Weight: 216 lb 12.8 oz (98.3 kg)    GENERAL:alert, no distress and comfortable HEART: Noted mild bilateral lower extremity edema NEURO: alert & oriented x 3 with fluent speech, no focal motor/sensory deficits  LABORATORY DATA:  I have reviewed the data as listed    Component Value Date/Time   NA 141 08/07/2019 0820   K 4.1 08/07/2019 0820   CL 106 08/07/2019 0820   CO2 23 08/07/2019 0820   GLUCOSE 161 (H) 08/07/2019 0820  BUN 23 (H) 08/07/2019 0820   CREATININE 1.49 (H) 08/07/2019 0820   CALCIUM 9.1 08/07/2019 0820   PROT 7.2 08/07/2019 0820   ALBUMIN 3.6 08/07/2019 0820   AST 15 08/07/2019 0820   ALT 27 08/07/2019 0820   ALKPHOS 74 08/07/2019 0820   BILITOT 0.4 08/07/2019 0820   GFRNONAA 43 (L) 08/07/2019 0820   GFRAA 49 (L) 08/07/2019 0820    No results found for: SPEP, UPEP  Lab Results  Component Value Date   WBC 6.6 08/07/2019   NEUTROABS 4.7 08/07/2019   HGB 11.8 (L) 08/07/2019   HCT 34.8 (L) 08/07/2019   MCV 87.7 08/07/2019   PLT 267 08/07/2019      Chemistry      Component Value Date/Time   NA 141 08/07/2019 0820   K  4.1 08/07/2019 0820   CL 106 08/07/2019 0820   CO2 23 08/07/2019 0820   BUN 23 (H) 08/07/2019 0820   CREATININE 1.49 (H) 08/07/2019 0820      Component Value Date/Time   CALCIUM 9.1 08/07/2019 0820   ALKPHOS 74 08/07/2019 0820   AST 15 08/07/2019 0820   ALT 27 08/07/2019 0820   BILITOT 0.4 08/07/2019 0820

## 2019-08-07 NOTE — Telephone Encounter (Signed)
-----   Message from [name], MD sent at 08/07/2019  9:23 AM EDT ----- Regarding: pls call and let jher know creatinine a bit up, drink more water

## 2019-08-07 NOTE — Telephone Encounter (Signed)
Called and given below message. She verbalized understanding. 

## 2019-08-08 ENCOUNTER — Telehealth: Payer: Self-pay | Admitting: Hematology and Oncology

## 2019-08-08 NOTE — Telephone Encounter (Signed)
Scheduled per 6/7 sch message. Pt aware of appt

## 2019-09-06 ENCOUNTER — Other Ambulatory Visit: Payer: Self-pay

## 2019-09-06 ENCOUNTER — Ambulatory Visit (HOSPITAL_COMMUNITY)
Admission: RE | Admit: 2019-09-06 | Discharge: 2019-09-06 | Disposition: A | Discharge status: Home / Self Care | Payer: Medicaid Other | Source: Ambulatory Visit | Attending: Hematology and Oncology | Admitting: Hematology and Oncology

## 2019-09-06 DIAGNOSIS — C539 Malignant neoplasm of cervix uteri, unspecified: Secondary | ICD-10-CM | POA: Insufficient documentation

## 2019-09-06 LAB — GLUCOSE, CAPILLARY: Glucose-Capillary: 99 mg/dL (ref 70–99)

## 2019-09-06 MED ORDER — FLUDEOXYGLUCOSE F - 18 (FDG) INJECTION
10.7000 | Freq: Once | INTRAVENOUS | Status: AC | PRN
  Administered 2019-09-06: 10.7 via INTRAVENOUS

## 2019-09-07 ENCOUNTER — Encounter: Payer: Self-pay | Admitting: Hematology and Oncology

## 2019-09-07 ENCOUNTER — Other Ambulatory Visit: Payer: Self-pay

## 2019-09-07 ENCOUNTER — Inpatient Hospital Stay: Payer: Medicaid Other | Attending: Hematology and Oncology | Admitting: Hematology and Oncology

## 2019-09-07 ENCOUNTER — Telehealth: Payer: Self-pay | Admitting: Hematology and Oncology

## 2019-09-07 DIAGNOSIS — I10 Essential (primary) hypertension: Secondary | ICD-10-CM | POA: Diagnosis not present

## 2019-09-07 DIAGNOSIS — C539 Malignant neoplasm of cervix uteri, unspecified: Secondary | ICD-10-CM | POA: Diagnosis not present

## 2019-09-07 DIAGNOSIS — Z6839 Body mass index (BMI) 39.0-39.9, adult: Secondary | ICD-10-CM | POA: Insufficient documentation

## 2019-09-07 DIAGNOSIS — E669 Obesity, unspecified: Secondary | ICD-10-CM | POA: Insufficient documentation

## 2019-09-07 DIAGNOSIS — Z79899 Other long term (current) drug therapy: Secondary | ICD-10-CM | POA: Diagnosis not present

## 2019-09-07 DIAGNOSIS — E66812 Obesity, class 2: Secondary | ICD-10-CM

## 2019-09-07 DIAGNOSIS — Z923 Personal history of irradiation: Secondary | ICD-10-CM | POA: Diagnosis not present

## 2019-09-07 NOTE — Assessment & Plan Note (Signed)
She has completed concurrent chemoradiation therapy PET CT scan by my review show complete response I will get her case presented at the next GYN oncology tumor board If the overall consensus is she has achieved complete response to treatment, I plan to get her port removed She will have future follow-up with GYN oncologist and radiation oncologist

## 2019-09-07 NOTE — Progress Notes (Signed)
[facility] OFFICE PROGRESS NOTE  Patient Care Team: [place], [place] as PCP - General (Family Medicine)  ASSESSMENT & PLAN:  Squamous cell carcinoma of cervix ([place]) She has completed concurrent chemoradiation therapy PET CT scan by my review show complete response I will get her case presented at the next GYN oncology tumor board If the overall consensus is she has achieved complete response to treatment, I plan to get her port removed She will have future follow-up with GYN oncologist and radiation oncologist  Essential hypertension She has poorly controlled hypertension Her blood pressure regimen has recently been increased by her primary care doctor We discussed the importance of dietary modification and weight loss  Obesity, Class II, BMI 35-39.9 She has medically complicated obesity The patient wants to get extra help to lose weight and to lead a healthy lifestyle I will refer her to medical weight management center   Orders Placed This Encounter  Procedures  . Amb Ref to Medical Weight Management    Referral Priority:   Routine    Referral Type:   Consultation    Number of Visits Requested:   1    All questions were answered. The patient knows to call the clinic with any problems, questions or concerns. The total time spent in the appointment was 20 minutes encounter with patients including review of chart and various tests results, discussions about plan of care and coordination of care plan   [name], MD 09/07/2019 9:04 AM  INTERVAL HISTORY: Please see below for problem oriented charting. She returns for further follow-up She feels well No further vaginal bleeding She has mild progressive weight gain She has mild bilateral lower extremity edema  SUMMARY OF ONCOLOGIC HISTORY: Oncology History  Squamous cell carcinoma of cervix ([place])  03/21/2019 Pathology Results   The biopsy is superficial and therefore depth of invasion cannot  be  determined.  There is at least carcinoma in-situ.    03/23/2019 Initial Diagnosis   Squamous cell carcinoma of cervix ([place])   03/23/2019 Pathology Results   CERVIX, 11 O CLOCK, BIOPSY:  -  Squamous cell carcinoma, invasive  -  See comment   03/23/2019 - 06/05/2019 Radiation Therapy   External beam therapy was from 03/23/2019 - 05/04/2019. HDR vaginal brachytherapy was from 05/19/2019 - 06/05/2019.   Site Technique Total Dose (Gy) Dose per Fx (Gy) Completed Fx Beam Energies  Cervix: Cervix HDR-brachy 5.5/5.5 5.5 1/1 Ir-192  Cervix: Cervix HDR-brachy 5.5/5.5 5.5 1/1 Ir-192  Cervix: Cervix_Bst HDR-brachy 5.5/5.5 5.5 1/1 Ir-192  Cervix: Cervix HDR-brachy 5.5/5.5 5.5 1/1 Ir-192  Cervix: Cervix 3D 45/45 1.8 25/25 10X, 15X  Cervix: Cervix HDR-brachy 5.5/5.5 5.5 1/1 Ir-192  Cervix: Cervix_Bst 3D 9/9 1.8 5/5 15X      03/28/2019 PET scan   1. Hypermetabolic cervical mass measures roughly 7.6 by 6.9 by 4.9 cm, compatible with malignancy. No adenopathy or distant metastatic spread identified. 2. Extending cephalad and anterior to the uterine fundus, there is a 450 cubic cm simple fluid density structure without hypermetabolic activity. This may represent a right adnexal cyst (favored), peritoneal inclusion cyst, or (if the patient has a remote history of pancreatitis) a remote pseudocyst. 3. Faint ground density nodularity in both lower lobes which could be from atypical infection or extrinsic allergic alveolitis. Given nodular appearance of some of this ground-glass density, I would recommend follow up chest CT in 3 months time in order to ensure clearance. 4. Moderate cardiomegaly, cause uncertain. The patient also has  advanced for age/gender atherosclerosis. Aortic Atherosclerosis (ICD10-I70.0).     04/04/2019 Procedure   Successful placement of a right internal jugular approach power injectable Port-A-Cath. The catheter is ready for immediate use   04/07/2019 - 05/12/2019 Chemotherapy   The  patient had weekly cisplatin for chemotherapy treatment.     09/06/2019 PET scan   1. No evidence residual hypermetabolic cervical tissue. 2. No evidence of metastatic cervical carcinoma. 3. Large benign cystic mass unchanged in the upper pelvis.         REVIEW OF SYSTEMS:   Constitutional: Denies fevers, chills or abnormal weight loss Eyes: Denies blurriness of vision Ears, nose, mouth, throat, and face: Denies mucositis or sore throat Respiratory: Denies cough, dyspnea or wheezes Cardiovascular: Denies palpitation, chest discomfort Gastrointestinal:  Denies nausea, heartburn or change in bowel habits Skin: Denies abnormal skin rashes Lymphatics: Denies new lymphadenopathy or easy bruising Neurological:Denies numbness, tingling or new weaknesses Behavioral/Psych: Mood is stable, no new changes  All other systems were reviewed with the patient and are negative.  I have reviewed the past medical history, past surgical history, social history and family history with the patient and they are unchanged from previous note.  ALLERGIES:  is allergic to penicillins, shellfish allergy, and sulfa antibiotics.  MEDICATIONS:  Current Outpatient Medications  Medication Sig Dispense Refill  . citalopram (CELEXA) 20 MG tablet Take 20 mg by mouth daily.    . valsartan (DIOVAN) 160 MG tablet Take 160 mg by mouth daily.    Marland Kitchen acetaminophen (TYLENOL) 500 MG tablet Take 500 mg by mouth every 6 (six) hours as needed for headache.    Marland Kitchen amLODipine (NORVASC) 10 MG tablet Take 1 tablet (10 mg total) by mouth daily. (Patient taking differently: Take 10 mg by mouth every evening. ) 90 tablet 0  . cloNIDine (CATAPRES) 0.1 MG tablet Take 1 tablet (0.1 mg total) by mouth 2 (two) times daily as needed (blood pressure >180/110). 50 tablet 0  . fluticasone (FLONASE) 50 MCG/ACT nasal spray Place 1 spray into both nostrils daily as needed for allergies or rhinitis.    Marland Kitchen lidocaine-prilocaine (EMLA) cream Apply to  affected area once 30 g 3  . prochlorperazine (COMPAZINE) 10 MG tablet Take 1 tablet (10 mg total) by mouth every 6 (six) hours as needed (Nausea or vomiting). 30 tablet 1   No current facility-administered medications for this visit.    PHYSICAL EXAMINATION: ECOG PERFORMANCE STATUS: 1 - Symptomatic but completely ambulatory  Vitals:   09/07/19 0814  BP: (!) 154/83  Pulse: 94  Resp: 17  Temp: 98.5 F (36.9 C)  SpO2: 100%   Filed Weights   09/07/19 0814  Weight: 215 lb 4.8 oz (97.7 kg)    GENERAL:alert, no distress and comfortable NEURO: alert & oriented x 3 with fluent speech, no focal motor/sensory deficits  LABORATORY DATA:  I have reviewed the data as listed    Component Value Date/Time   NA 141 08/07/2019 0820   K 4.1 08/07/2019 0820   CL 106 08/07/2019 0820   CO2 23 08/07/2019 0820   GLUCOSE 161 (H) 08/07/2019 0820   BUN 23 (H) 08/07/2019 0820   CREATININE 1.49 (H) 08/07/2019 0820   CALCIUM 9.1 08/07/2019 0820   PROT 7.2 08/07/2019 0820   ALBUMIN 3.6 08/07/2019 0820   AST 15 08/07/2019 0820   ALT 27 08/07/2019 0820   ALKPHOS 74 08/07/2019 0820   BILITOT 0.4 08/07/2019 0820   GFRNONAA 43 (L) 08/07/2019 0820   GFRAA 49 (  L) 08/07/2019 0820    No results found for: SPEP, UPEP  Lab Results  Component Value Date   WBC 6.6 08/07/2019   NEUTROABS 4.7 08/07/2019   HGB 11.8 (L) 08/07/2019   HCT 34.8 (L) 08/07/2019   MCV 87.7 08/07/2019   PLT 267 08/07/2019      Chemistry      Component Value Date/Time   NA 141 08/07/2019 0820   K 4.1 08/07/2019 0820   CL 106 08/07/2019 0820   CO2 23 08/07/2019 0820   BUN 23 (H) 08/07/2019 0820   CREATININE 1.49 (H) 08/07/2019 0820      Component Value Date/Time   CALCIUM 9.1 08/07/2019 0820   ALKPHOS 74 08/07/2019 0820   AST 15 08/07/2019 0820   ALT 27 08/07/2019 0820   BILITOT 0.4 08/07/2019 0820       RADIOGRAPHIC STUDIES: I have reviewed multiple imaging studies with the patient I have personally reviewed  the radiological images as listed and agreed with the findings in the report. NM PET Image Restag (PS) Skull Base To Thigh  Result Date: 09/06/2019 CLINICAL DATA:  Subsequent treatment strategy for cervical carcinoma. EXAM: NUCLEAR MEDICINE PET SKULL BASE TO THIGH TECHNIQUE: 10.7 mCi F-18 FDG was injected intravenously. Full-ring PET imaging was performed from the skull base to thigh after the radiotracer. CT data was obtained and used for attenuation correction and anatomic localization. Fasting blood glucose: 99 mg/dl COMPARISON:  PET-CT 03/28/2019 FINDINGS: Mediastinal blood pool activity: SUV max 2.9 Liver activity: SUV max NA NECK: No hypermetabolic lymph nodes in the neck. Incidental CT findings: none CHEST: No hypermetabolic mediastinal or hilar nodes. No suspicious pulmonary nodules on the CT scan. Incidental CT findings: Port in the anterior chest wall with tip in distal SVC. ABDOMEN/PELVIS: No residual hypermetabolic activity at the uterine cervix. No hypermetabolic tissue at the pelvic sidewalls. No hypermetabolic abdominopelvic lymph nodes. No evidence metastatic disease in the liver. Large simple fluid cystic mass in the upper pelvis measuring 10 cm without metabolic activity is unchanged Incidental CT findings: none SKELETON: No focal hypermetabolic activity to suggest skeletal metastasis. Incidental CT findings: none IMPRESSION: 1. No evidence residual hypermetabolic cervical tissue. 2. No evidence of metastatic cervical carcinoma. 3. Large benign cystic mass unchanged in the upper pelvis. Electronically Signed   By: Suzy Bouchard M.D.   On: 09/06/2019 16:29

## 2019-09-07 NOTE — Assessment & Plan Note (Signed)
She has medically complicated obesity The patient wants to get extra help to lose weight and to lead a healthy lifestyle I will refer her to medical weight management center

## 2019-09-07 NOTE — Telephone Encounter (Signed)
Per 7/8 los, no new changes made to pt schedule

## 2019-09-07 NOTE — Assessment & Plan Note (Signed)
She has poorly controlled hypertension Her blood pressure regimen has recently been increased by her primary care doctor We discussed the importance of dietary modification and weight loss

## 2019-09-11 ENCOUNTER — Telehealth: Payer: Self-pay | Admitting: Oncology

## 2019-09-11 ENCOUNTER — Other Ambulatory Visit: Payer: Self-pay | Admitting: Hematology and Oncology

## 2019-09-11 ENCOUNTER — Other Ambulatory Visit: Payer: Self-pay | Admitting: Oncology

## 2019-09-11 DIAGNOSIS — C539 Malignant neoplasm of cervix uteri, unspecified: Secondary | ICD-10-CM

## 2019-09-11 NOTE — Progress Notes (Signed)
Gynecologic Oncology Multi-Disciplinary Disposition Conference Note  Date of the Conference: 09/11/2019  Patient Name: Tamara Osborne  Primary GYN Oncologist: Dr. Berline Lopes  Stage/Disposition:  At least stage IIB squamous cell carcinoma of the cervix. Disposition is for port a cath removal and follow up with Dr. Sondra Come in 3 months and Dr. Berline Lopes in 6 months.   This Multidisciplinary conference took place involving physicians from [place], [place], Radiation Oncology, Pathology, Radiology along with the Gynecologic Oncology Nurse Practitioner and RN.  Comprehensive assessment of the patient's malignancy, staging, need for surgery, chemotherapy, radiation therapy, and need for further testing were reviewed. Supportive measures, both inpatient and following discharge were also discussed. The recommended plan of care is documented. Greater than 35 minutes were spent correlating and coordinating this patient's care.

## 2019-09-11 NOTE — Telephone Encounter (Signed)
Called [place] and advised her of Gyn Conference recommendations from this morning and that Dr. Alvy Bimler has ordered for her port to be removed.  Discussed that IR will call her with an appointment and that she also will need to follow up with Dr. Berline Lopes in 6 months.  She will be called to schedule that appointment when Dr. Charisse March schedule is available for January.  She verbalized understanding and agreement.

## 2019-09-20 ENCOUNTER — Other Ambulatory Visit: Payer: Self-pay | Admitting: Student

## 2019-09-20 ENCOUNTER — Other Ambulatory Visit: Payer: Self-pay

## 2019-09-20 ENCOUNTER — Encounter (INDEPENDENT_AMBULATORY_CARE_PROVIDER_SITE_OTHER): Payer: Self-pay | Admitting: Family Medicine

## 2019-09-20 ENCOUNTER — Ambulatory Visit (INDEPENDENT_AMBULATORY_CARE_PROVIDER_SITE_OTHER): Payer: Medicaid Other | Admitting: Family Medicine

## 2019-09-20 VITALS — BP 141/89 | HR 85 | Temp 97.8°F | Ht 61.0 in | Wt 208.0 lb

## 2019-09-20 DIAGNOSIS — R5383 Other fatigue: Secondary | ICD-10-CM | POA: Diagnosis not present

## 2019-09-20 DIAGNOSIS — F419 Anxiety disorder, unspecified: Secondary | ICD-10-CM | POA: Diagnosis not present

## 2019-09-20 DIAGNOSIS — G4733 Obstructive sleep apnea (adult) (pediatric): Secondary | ICD-10-CM

## 2019-09-20 DIAGNOSIS — R739 Hyperglycemia, unspecified: Secondary | ICD-10-CM | POA: Diagnosis not present

## 2019-09-20 DIAGNOSIS — F329 Major depressive disorder, single episode, unspecified: Secondary | ICD-10-CM | POA: Diagnosis not present

## 2019-09-20 DIAGNOSIS — R0602 Shortness of breath: Secondary | ICD-10-CM

## 2019-09-20 DIAGNOSIS — Z6839 Body mass index (BMI) 39.0-39.9, adult: Secondary | ICD-10-CM

## 2019-09-20 DIAGNOSIS — I10 Essential (primary) hypertension: Secondary | ICD-10-CM

## 2019-09-20 DIAGNOSIS — Z0289 Encounter for other administrative examinations: Secondary | ICD-10-CM

## 2019-09-21 ENCOUNTER — Ambulatory Visit (HOSPITAL_COMMUNITY)
Admission: RE | Admit: 2019-09-21 | Discharge: 2019-09-21 | Disposition: A | Discharge status: Home / Self Care | Payer: Medicaid Other | Source: Ambulatory Visit | Attending: Hematology and Oncology | Admitting: Hematology and Oncology

## 2019-09-21 ENCOUNTER — Telehealth: Payer: Self-pay | Admitting: Oncology

## 2019-09-21 ENCOUNTER — Telehealth: Payer: Self-pay | Admitting: Hematology and Oncology

## 2019-09-21 ENCOUNTER — Encounter (HOSPITAL_COMMUNITY): Payer: Self-pay

## 2019-09-21 ENCOUNTER — Other Ambulatory Visit: Payer: Self-pay | Admitting: Hematology and Oncology

## 2019-09-21 DIAGNOSIS — C539 Malignant neoplasm of cervix uteri, unspecified: Secondary | ICD-10-CM | POA: Insufficient documentation

## 2019-09-21 DIAGNOSIS — Z539 Procedure and treatment not carried out, unspecified reason: Secondary | ICD-10-CM | POA: Insufficient documentation

## 2019-09-21 HISTORY — DX: Type 2 diabetes mellitus without complications: E11.9

## 2019-09-21 LAB — LIPID PANEL WITH LDL/HDL RATIO
Cholesterol, Total: 222 mg/dL — ABNORMAL HIGH (ref 100–199)
HDL: 34 mg/dL — ABNORMAL LOW (ref >39)
LDL Chol Calc (NIH): 158 mg/dL — ABNORMAL HIGH (ref 0–99)
LDL/HDL Ratio: 4.6 ratio — ABNORMAL HIGH (ref 0.0–3.2)
Triglycerides: 162 mg/dL — ABNORMAL HIGH (ref 0–149)
VLDL Cholesterol Cal: 30 mg/dL (ref 5–40)

## 2019-09-21 LAB — T3: T3, Total: 171 ng/dL (ref 71–180)

## 2019-09-21 LAB — TSH: TSH: 1.91 u[IU]/mL (ref 0.450–4.500)

## 2019-09-21 LAB — COMPREHENSIVE METABOLIC PANEL
ALT: 29 IU/L (ref 0–32)
AST: 20 IU/L (ref 0–40)
Albumin/Globulin Ratio: 1.3 (ref 1.2–2.2)
Albumin: 4.5 g/dL (ref 3.8–4.8)
Alkaline Phosphatase: 111 IU/L (ref 48–121)
BUN/Creatinine Ratio: 15 (ref 9–23)
BUN: 20 mg/dL (ref 6–24)
Bilirubin Total: 0.3 mg/dL (ref 0.0–1.2)
CO2: 20 mmol/L (ref 20–29)
Calcium: 9.6 mg/dL (ref 8.7–10.2)
Chloride: 105 mmol/L (ref 96–106)
Creatinine, Ser: 1.35 mg/dL — ABNORMAL HIGH (ref 0.57–1.00)
GFR calc Af Amer: 55 mL/min/{1.73_m2} — ABNORMAL LOW (ref >59)
GFR calc non Af Amer: 48 mL/min/{1.73_m2} — ABNORMAL LOW (ref >59)
Globulin, Total: 3.4 g/dL (ref 1.5–4.5)
Glucose: 98 mg/dL (ref 65–99)
Potassium: 4.5 mmol/L (ref 3.5–5.2)
Sodium: 140 mmol/L (ref 134–144)
Total Protein: 7.9 g/dL (ref 6.0–8.5)

## 2019-09-21 LAB — INSULIN, RANDOM: INSULIN: 40.3 u[IU]/mL — ABNORMAL HIGH (ref 2.6–24.9)

## 2019-09-21 LAB — VITAMIN D 25 HYDROXY (VIT D DEFICIENCY, FRACTURES): Vit D, 25-Hydroxy: 29.2 ng/mL — ABNORMAL LOW (ref 30.0–100.0)

## 2019-09-21 LAB — T4, FREE: Free T4: 1.31 ng/dL (ref 0.82–1.77)

## 2019-09-21 LAB — HEMOGLOBIN A1C

## 2019-09-21 MED ORDER — VANCOMYCIN HCL IN DEXTROSE 1-5 GM/200ML-% IV SOLN: 1000.0000 mg | Freq: Once | INTRAVENOUS | Status: DC

## 2019-09-21 NOTE — Telephone Encounter (Signed)
Left a message advising that Dr. Alvy Bimler will order port flushes for another 6 months and to expect a call from the scheduler.  Requested a return call to confirm.

## 2019-09-21 NOTE — Telephone Encounter (Signed)
Called Tamara Osborne regarding canceled port removal.  She said she saw Medical Weight Management yesterday and they had to stick her 4 times for blood.  She also had an EKG that was abnormal and needs further work up.  She said since she is such a hard stick, she started to get really nervous about having the port removed.  She would like to keep it for a little while longer until she knows that she won't need it.    Advised her that Dr. Alvy Bimler is going to order port flushes for another 6 months and that a scheduler will call her with the appointments.

## 2019-09-21 NOTE — Telephone Encounter (Signed)
Scheduled appointments per 7/22 message. Patient is aware of appointment dates and times.

## 2019-09-26 NOTE — Progress Notes (Signed)
Dear Dr. Alvy Bimler,   Thank you for referring Tamara Osborne to our clinic. The following note includes my evaluation and treatment recommendations.  Chief Complaint:   OBESITY Tamara Osborne (MR# [id number]) is a 44 y.o. female who presents for evaluation and treatment of obesity and related comorbidities. Current BMI is Body mass index is 39.3 kg/m. Tamara Osborne has been struggling with her weight for many years and has been unsuccessful in either losing weight, maintaining weight loss, or reaching her healthy weight goal.  Tamara Osborne states breakfast is egg biscuit or 3 eggs or 2 waffles (feels full). Lunch is [employer]1 eats all and drinks all coke or sweet tea. Afternoon is Nabs or crackers. Dinner is hamburger (2 burgers), 1 cup of Mac and cheese, 1 cup of mashed potatoes, 6 yeast rolls (feels full). After dinner she has sweets, ice cream and oatmeal cookies or pies, or candy.  Tamara Osborne is currently in the action stage of change and ready to dedicate time achieving and maintaining a healthier weight. Tamara Osborne is interested in becoming our patient and working on intensive lifestyle modifications including (but not limited to) diet and exercise for weight loss.  Tamara Osborne's habits were reviewed today and are as follows: Her family eats meals together, she thinks her family will eat healthier with her, her desired weight loss is 58 lbs, she started gaining weight after her last child, her heaviest weight ever was 220 pounds, she is a picky eater and doesn't like to eat healthier foods, she has significant food cravings issues, she snacks frequently in the evenings, she is frequently drinking liquids with calories, she frequently makes poor food choices, she has problems with excessive hunger, she frequently eats larger portions than normal and she struggles with emotional eating.  Depression Screen Tamara Osborne's Food and Mood (modified PHQ-9) score was 7.  Depression screen PHQ 2/9  09/20/2019  Decreased Interest 1  Down, Depressed, Hopeless 1  PHQ - 2 Score 2  Altered sleeping 1  Tired, decreased energy 2  Change in appetite 1  Feeling bad or failure about yourself  0  Trouble concentrating 1  Moving slowly or fidgety/restless 0  Suicidal thoughts 0  PHQ-9 Score 7  Difficult doing work/chores Somewhat difficult   Subjective:   1. Other fatigue Tamara Osborne admits to daytime somnolence and denies waking up still tired. Patent has a history of symptoms of daytime fatigue. Tamara Osborne generally gets 8 or 10 hours of sleep per night, and states that she has generally restful sleep. Snoring is present. Apneic episodes are present. Epworth Sleepiness Score is 19. EKG-normal sinus rhythm with diffuse T wave abnormality (same as last 3 EKGs).  2. SOB (shortness of breath) on exertion Tamara Osborne is experiencing significant shortness of breath with walking (she can not walk up entire flight of stairs), and seems to be worsening over time with weight gain. She notes getting out of breath sooner with activity than she used to. This has not gotten worse recently. Tamara Osborne denies shortness of breath at rest or orthopnea.  3. Essential hypertension Tamara Osborne was diagnosed in her early 16's. She is on amlodipine and valsartan. Her blood pressure is slightly elevated today.  4. Hyperglycemia Tamara Osborne is unsure if she has a diagnosis of pre-diabetes or diabetes mellitus.  5. OSA (obstructive sleep apnea) Tamara Osborne was diagnosed 4 years ago. Her insurance wouldn't pay for CPAP.  6. Anxiety and depression Tamara Osborne is on Celexa and she denies improvement in symptoms.  Assessment/Plan:  1. Other fatigue Tamara Osborne does feel that her weight is causing her energy to be lower than it should be. Fatigue may be related to obesity, depression or many other causes. Labs will be ordered, and in the meanwhile, Tamara Osborne will focus on self care including making healthy food choices, increasing physical  activity and focusing on stress reduction.  - EKG 12-Lead - Comprehensive metabolic panel - Lipid Panel With LDL/HDL Ratio - VITAMIN D 25 Hydroxy (Vit-D Deficiency, Fractures) - T3 - T4, free - TSH  2. SOB (shortness of breath) on exertion Tamara Osborne does feel that she gets out of breath more easily that she used to when she exercises. Tamara Osborne's shortness of breath appears to be obesity related and exercise induced. She has agreed to work on weight loss and gradually increase exercise to treat her exercise induced shortness of breath. Will continue to monitor closely.  - Lipid Panel With LDL/HDL Ratio - VITAMIN D 25 Hydroxy (Vit-D Deficiency, Fractures) - T3 - T4, free - TSH  3. Essential hypertension Tamara Osborne is working on healthy weight loss and exercise to improve blood pressure control. We will watch for signs of hypotension as she continues her lifestyle modifications. We will check labs today.  - Comprehensive metabolic panel  4. Hyperglycemia Fasting labs will be obtained today, and results with be discussed with Tamara Osborne in 2 weeks at her follow up visit. In the meanwhile Tamara Osborne was started on a lower simple carbohydrate diet and will work on weight loss efforts.  - Hemoglobin A1c - Insulin, random  5. OSA (obstructive sleep apnea) Intensive lifestyle modifications are the first line treatment for this issue. We discussed several lifestyle modifications today. Tamara Osborne will continue to work on diet, exercise and weight loss efforts. We will continue to monitor. We will follow up at her next appointment. Orders and follow up as documented in patient record.   Counseling  Sleep apnea is a condition in which breathing pauses or becomes shallow during sleep. This happens over and over during the night. This disrupts your sleep and keeps your body from getting the rest that it needs, which can cause tiredness and lack of energy (fatigue) during the day.  Sleep apnea  treatment: If you were given a device to open your airway while you sleep, USE IT!  Sleep hygiene:   Limit or avoid alcohol, caffeinated beverages, and cigarettes, especially close to bedtime.   Do not eat a large meal or eat spicy foods right before bedtime. This can lead to digestive discomfort that can make it hard for you to sleep.  Keep a sleep diary to help you and your health care provider figure out what could be causing your insomnia.   Make your bedroom a dark, comfortable place where it is easy to fall asleep. ? Put up shades or blackout curtains to block light from outside. ? Use a white noise machine to block noise. ? Keep the temperature cool.  Limit screen use before bedtime. This includes: ? Watching TV. ? Using your smartphone, tablet, or computer.  Stick to a routine that includes going to bed and waking up at the same times every day and night. This can help you fall asleep faster. Consider making a quiet activity, such as reading, part of your nighttime routine.  Try to avoid taking naps during the day so that you sleep better at night.  Get out of bed if you are still awake after 15 minutes of trying to sleep. Keep the lights down,  but try reading or doing a quiet activity. When you feel sleepy, go back to bed.  6. Anxiety and depression Behavior modification techniques were discussed today to help Tamara Osborne deal with her anxiety. We will follow up on symptom control at her next appointment. Orders and follow up as documented in patient record.   7. Class 2 severe obesity with serious comorbidity and body mass index (BMI) of 39.0 to 39.9 in adult, unspecified obesity type (HCC) Tamara Osborne is currently in the action stage of change and her goal is to continue with weight loss efforts. I recommend Tamara Osborne begin the structured treatment plan as follows:  She has agreed to the Category 2 Plan.  Exercise goals: No exercise has been prescribed at this time.    Behavioral modification strategies: increasing lean protein intake, meal planning and cooking strategies, keeping healthy foods in the home and planning for success.  She was informed of the importance of frequent follow-up visits to maximize her success with intensive lifestyle modifications for her multiple health conditions. She was informed we would discuss her lab results at her next visit unless there is a critical issue that needs to be addressed sooner. Tamara Osborne agreed to keep her next visit at the agreed upon time to discuss these results.  Objective:   Blood pressure (!) 141/89, pulse 85, temperature 97.8 F (36.6 C), temperature source Oral, height _0  (1.549 m), weight 208 lb (94.3 kg), last menstrual period 04/17/2019, SpO2 97 %. Body mass index is 39.3 kg/m.  EKG: Normal sinus rhythm, rate 84 BPM.  Indirect Calorimeter completed today shows a VO2 of 197 and a REE of 1371.  Her calculated basal metabolic rate is 3235 thus her basal metabolic rate is worse than expected.  General: Cooperative, alert, well developed, in no acute distress. HEENT: Conjunctivae and lids unremarkable. Cardiovascular: Regular rhythm.  Lungs: Normal work of breathing. Neurologic: No focal deficits.   Lab Results  Component Value Date   CREATININE 1.35 (H) 09/20/2019   BUN 20 09/20/2019   NA 140 09/20/2019   K 4.5 09/20/2019   CL 105 09/20/2019   CO2 20 09/20/2019   Lab Results  Component Value Date   ALT 29 09/20/2019   AST 20 09/20/2019   ALKPHOS 111 09/20/2019   BILITOT 0.3 09/20/2019   Lab Results  Component Value Date   HGBA1C CANCELED 09/20/2019   Lab Results  Component Value Date   INSULIN 40.3 (H) 09/20/2019   Lab Results  Component Value Date   TSH 1.910 09/20/2019   Lab Results  Component Value Date   CHOL 222 (H) 09/20/2019   HDL 34 (L) 09/20/2019   LDLCALC 158 (H) 09/20/2019   TRIG 162 (H) 09/20/2019   Lab Results  Component Value Date   WBC 6.6  08/07/2019   HGB 11.8 (L) 08/07/2019   HCT 34.8 (L) 08/07/2019   MCV 87.7 08/07/2019   PLT 267 08/07/2019   Lab Results  Component Value Date   IRON 17 (L) 03/27/2019   TIBC 283 03/27/2019   FERRITIN 32 03/27/2019   Attestation Statements:   This is the patient's first visit at [facility]. The patient's NEW PATIENT PACKET was reviewed at length. Included in the packet: current and past health history, medications, allergies, ROS, gynecologic history (women only), surgical history, family history, social history, weight history, weight loss surgery history (for those that have had weight loss surgery), nutritional evaluation, mood and food questionnaire, PHQ9, Epworth questionnaire, sleep habits questionnaire,  patient life and health improvement goals questionnaire. These will all be scanned into the patient's chart under media.   During the visit, I independently reviewed the patient's EKG, bioimpedance scale results, and indirect calorimeter results. I used this information to tailor a meal plan for the patient that will help her to lose weight and will improve her obesity-related conditions going forward. I performed a medically necessary appropriate examination and/or evaluation. I discussed the assessment and treatment plan with the patient. The patient was provided an opportunity to ask questions and all were answered. The patient agreed with the plan and demonstrated an understanding of the instructions. Labs were ordered at this visit and will be reviewed at the next visit unless more critical results need to be addressed immediately. Clinical information was updated and documented in the EMR.    A separate 15 minutes was spent on risk counseling (see above).    Time spent on visit including pre-visit chart review and post-visit charting and care was 40 minutes.    I, Trixie Dredge, am acting as transcriptionist for Coralie Common, MD.  I have reviewed the above  documentation for accuracy and completeness, and I agree with the above. - Jinny Blossom, MD

## 2019-10-04 ENCOUNTER — Encounter (INDEPENDENT_AMBULATORY_CARE_PROVIDER_SITE_OTHER): Payer: Self-pay | Admitting: Family Medicine

## 2019-10-04 ENCOUNTER — Inpatient Hospital Stay: Payer: Medicaid Other | Attending: Hematology and Oncology

## 2019-10-04 ENCOUNTER — Ambulatory Visit (INDEPENDENT_AMBULATORY_CARE_PROVIDER_SITE_OTHER): Payer: Medicaid Other | Admitting: Family Medicine

## 2019-10-04 ENCOUNTER — Other Ambulatory Visit: Payer: Self-pay

## 2019-10-04 VITALS — BP 160/96 | HR 99 | Temp 98.2°F | Ht 61.0 in | Wt 208.0 lb

## 2019-10-04 DIAGNOSIS — C539 Malignant neoplasm of cervix uteri, unspecified: Secondary | ICD-10-CM | POA: Insufficient documentation

## 2019-10-04 DIAGNOSIS — G4733 Obstructive sleep apnea (adult) (pediatric): Secondary | ICD-10-CM

## 2019-10-04 DIAGNOSIS — I10 Essential (primary) hypertension: Secondary | ICD-10-CM | POA: Diagnosis not present

## 2019-10-04 DIAGNOSIS — Z452 Encounter for adjustment and management of vascular access device: Secondary | ICD-10-CM | POA: Diagnosis not present

## 2019-10-04 DIAGNOSIS — E7849 Other hyperlipidemia: Secondary | ICD-10-CM | POA: Diagnosis not present

## 2019-10-04 DIAGNOSIS — E559 Vitamin D deficiency, unspecified: Secondary | ICD-10-CM

## 2019-10-04 DIAGNOSIS — R0602 Shortness of breath: Secondary | ICD-10-CM

## 2019-10-04 DIAGNOSIS — E8881 Metabolic syndrome: Secondary | ICD-10-CM

## 2019-10-04 DIAGNOSIS — Z6839 Body mass index (BMI) 39.0-39.9, adult: Secondary | ICD-10-CM

## 2019-10-04 DIAGNOSIS — R5383 Other fatigue: Secondary | ICD-10-CM

## 2019-10-04 MED ORDER — VITAMIN D (ERGOCALCIFEROL) 1.25 MG (50000 UNIT) PO CAPS: 50000.0000 [IU] | ORAL_CAPSULE | ORAL | 0 refills | Status: DC

## 2019-10-04 MED FILL — LIDOCAINE-PRILOCAINE 2.5-2.: 2.5-2.5 | 20 days supply | Qty: 30 | Fill #1

## 2019-10-05 NOTE — Progress Notes (Signed)
Chief Complaint:   OBESITY Tamara Osborne is here to discuss her progress with her obesity treatment plan along with follow-up of her obesity related diagnoses. Tamara Osborne is on the Category 2 Plan and states she is following her eating plan approximately 100% of the time. Tamara Osborne states she is walking 1.5 laps 7 times per week.  Today's visit was #: 2 Starting weight: 208 lbs Starting date: 09/20/2019 Today's weight: 208 lbs Today's date: 10/04/2019 Total lbs lost to date: 0 Total lbs lost since last in-office visit: 0  Interim History: Tamara Osborne voices she followed the plan almost completely. The first week she didn't follow the plan much but the second week she followed the plan more strictly. She is having significant hot flashes and she is drinking a significant amount of water. Breakfast she is doing 1 piece of toast, 2 eggs, and lunch was sandwich meat and 1 piece of toast. Dinner is broccoli and chicken breast. She is doing apples, pears, and yogurt for snack.  Subjective:   1. Other hyperlipidemia Tamara Osborne's last LDL was 158, triglycerides 162, and HDL 34. She is not on statin.  2. Vitamin D deficiency Tamara Osborne's last Vit D level was of 29.2. She notes fatigue.  3. Essential hypertension Tamara Osborne's blood pressure is slightly elevated today. She denies chest pain, chest pressure, or headaches. She is on valsartan and amlodipine with side effects.  4. OSA (obstructive sleep apnea) Tamara Osborne voices she is waiting to hear from supplies about getting her CPAP. Per patient, because she is Medicaid she is on a list.  5. Insulin resistance Tamara Osborne has no A1c due to difficult time with blood draw. Last insulin level was of 40.3.  6. SOB (shortness of breath) on exertion Tamara Osborne voices she struggles with on minimal exertion. EKG showing diffuse T wave abnormality.  7. Other fatigue Tamara Osborne voices she struggles with on minimal exertion.   Assessment/Plan:   1. Other  hyperlipidemia Cardiovascular risk and specific lipid/LDL goals reviewed. We discussed several lifestyle modifications today and Athalee will continue to work on diet, exercise and weight loss efforts. We will repeat labs in 3 months. Orders and follow up as documented in patient record.   Counseling Intensive lifestyle modifications are the first line treatment for this issue. . Dietary changes: Increase soluble fiber. Decrease simple carbohydrates. . Exercise changes: Moderate to vigorous-intensity aerobic activity 150 minutes per week if tolerated. . Lipid-lowering medications: see documented in medical record.  2. Vitamin D deficiency Low Vitamin D level contributes to fatigue and are associated with obesity, breast, and colon cancer. Joliet agreed to start prescription Vitamin D 50,000 IU every week with no refills. She will follow-up for routine testing of Vitamin D, at least 2-3 times per year to avoid over-replacement. We will repeat labs in 3 months.  - Vitamin D, Ergocalciferol, (DRISDOL) 1.25 MG (50000 UNIT) CAPS capsule; Take 1 capsule (50,000 Units total) by mouth every 7 (seven) days.  Dispense: 4 capsule; Refill: 0  3. Essential hypertension Ajee is working on healthy weight loss and exercise to improve blood pressure control. We will watch for signs of hypotension as she continues her lifestyle modifications. We will follow up on her blood pressure at her next appointment, Kadee is to check her blood pressure at home 3 times per week.  4. OSA (obstructive sleep apnea) Intensive lifestyle modifications are the first line treatment for this issue. We discussed several lifestyle modifications today. Ramiyah will continue to work on diet, exercise and weight loss  efforts. We will continue to monitor. We will follow up at her next appointment to see if she has gotten her supplies. Orders and follow up as documented in patient record.   Counseling  Sleep apnea is a  condition in which breathing pauses or becomes shallow during sleep. This happens over and over during the night. This disrupts your sleep and keeps your body from getting the rest that it needs, which can cause tiredness and lack of energy (fatigue) during the day.  Sleep apnea treatment: If you were given a device to open your airway while you sleep, USE IT!  Sleep hygiene:   Limit or avoid alcohol, caffeinated beverages, and cigarettes, especially close to bedtime.   Do not eat a large meal or eat spicy foods right before bedtime. This can lead to digestive discomfort that can make it hard for you to sleep.  Keep a sleep diary to help you and your health care provider figure out what could be causing your insomnia.  . Make your bedroom a dark, comfortable place where it is easy to fall asleep. ? Put up shades or blackout curtains to block light from outside. ? Use a white noise machine to block noise. ? Keep the temperature cool. . Limit screen use before bedtime. This includes: ? Watching TV. ? Using your smartphone, tablet, or computer. . Stick to a routine that includes going to bed and waking up at the same times every day and night. This can help you fall asleep faster. Consider making a quiet activity, such as reading, part of your nighttime routine. . Try to avoid taking naps during the day so that you sleep better at night. . Get out of bed if you are still awake after 15 minutes of trying to sleep. Keep the lights down, but try reading or doing a quiet activity. When you feel sleepy, go back to bed.  5. Insulin resistance Tamara Osborne will continue to work on weight loss, exercise, and decreasing simple carbohydrates to help decrease the risk of diabetes. we will repeat A1c at her next appointment. Tamara Osborne agreed to follow-up with [place] as directed to closely monitor her progress.  6. SOB (shortness of breath) on exertion We will refer Annikah to have an echocardiogram. Xin will  follow up as directed.  7. Other fatigue We will refer Tamara Osborne to have an echocardiogram. Tamara Osborne will follow up as directed.  8. Class 2 severe obesity with serious comorbidity and body mass index (BMI) of 39.0 to 39.9 in adult, unspecified obesity type (HCC) Tamara Osborne is currently in the action stage of change. As such, her goal is to continue with weight loss efforts. She has agreed to the Category 2 Plan.   Exercise goals: All adults should avoid inactivity. Some physical activity is better than none, and adults who participate in any amount of physical activity gain some health benefits.  Behavioral modification strategies: increasing lean protein intake, increasing vegetables, meal planning and cooking strategies, keeping healthy foods in the home and planning for success.  Kimberlly has agreed to follow-up with our clinic in 2 weeks. She was informed of the importance of frequent follow-up visits to maximize her success with intensive lifestyle modifications for her multiple health conditions.   Objective:   Blood pressure (!) 160/96, pulse 99, temperature 98.2 F (36.8 C), temperature source Oral, height 5\' 1"  (1.549 m), weight 208 lb (94.3 kg), SpO2 96 %. Body mass index is 39.3 kg/m.  General: Cooperative, alert, well developed, in no  acute distress. HEENT: Conjunctivae and lids unremarkable. Cardiovascular: Regular rhythm.  Lungs: Normal work of breathing. Neurologic: No focal deficits.   Lab Results  Component Value Date   CREATININE 1.35 (H) 09/20/2019   BUN 20 09/20/2019   NA 140 09/20/2019   K 4.5 09/20/2019   CL 105 09/20/2019   CO2 20 09/20/2019   Lab Results  Component Value Date   ALT 29 09/20/2019   AST 20 09/20/2019   ALKPHOS 111 09/20/2019   BILITOT 0.3 09/20/2019   Lab Results  Component Value Date   HGBA1C CANCELED 09/20/2019   Lab Results  Component Value Date   INSULIN 40.3 (H) 09/20/2019   Lab Results  Component Value Date   TSH 1.910  09/20/2019   Lab Results  Component Value Date   CHOL 222 (H) 09/20/2019   HDL 34 (L) 09/20/2019   LDLCALC 158 (H) 09/20/2019   TRIG 162 (H) 09/20/2019   Lab Results  Component Value Date   WBC 6.6 08/07/2019   HGB 11.8 (L) 08/07/2019   HCT 34.8 (L) 08/07/2019   MCV 87.7 08/07/2019   PLT 267 08/07/2019   Lab Results  Component Value Date   IRON 17 (L) 03/27/2019   TIBC 283 03/27/2019   FERRITIN 32 03/27/2019   Attestation Statements:   Reviewed by clinician on day of visit: allergies, medications, problem list, medical history, surgical history, family history, social history, and previous encounter notes.  Time spent on visit including pre-visit chart review and post-visit care and charting was 50 minutes.    I, Trixie Dredge, am acting as transcriptionist for Coralie Common, MD.  I have reviewed the above documentation for accuracy and completeness, and I agree with the above. - Jinny Blossom, MD

## 2019-10-19 ENCOUNTER — Encounter (INDEPENDENT_AMBULATORY_CARE_PROVIDER_SITE_OTHER): Payer: Self-pay | Admitting: Family Medicine

## 2019-10-19 ENCOUNTER — Ambulatory Visit (INDEPENDENT_AMBULATORY_CARE_PROVIDER_SITE_OTHER): Payer: Medicaid Other | Admitting: Family Medicine

## 2019-10-19 ENCOUNTER — Other Ambulatory Visit: Payer: Self-pay

## 2019-10-19 VITALS — BP 129/83 | HR 109 | Temp 97.4°F | Ht 61.0 in | Wt 210.0 lb

## 2019-10-19 DIAGNOSIS — Z6839 Body mass index (BMI) 39.0-39.9, adult: Secondary | ICD-10-CM

## 2019-10-19 DIAGNOSIS — E559 Vitamin D deficiency, unspecified: Secondary | ICD-10-CM | POA: Diagnosis not present

## 2019-10-19 DIAGNOSIS — E8881 Metabolic syndrome: Secondary | ICD-10-CM

## 2019-10-19 DIAGNOSIS — G4733 Obstructive sleep apnea (adult) (pediatric): Secondary | ICD-10-CM | POA: Diagnosis not present

## 2019-10-19 NOTE — Progress Notes (Signed)
Chief Complaint:   OBESITY Tamara Osborne is here to discuss her progress with her obesity treatment plan along with follow-up of her obesity related diagnoses. Tamara Osborne is on the Category 2 Plan and states she is following her eating plan approximately 80% of the time. Tamara Osborne states she is walking 1/2 mile 7 times per week.  Today's visit was #: 3 Starting weight: 208 lbs Starting date: 09/20/2019 Today's weight: 210 lbs Today's date: 10/19/2019 Total lbs lost to date: 0 Total lbs lost since last in-office visit: 0  Interim History: Tamara Osborne has really struggled the last 2 weeks. She has felt more hungry and has tried to eat later and this has caused her to feel nauseous. She hasn't weighed her meat for lunch or dinner. She is eating 1 breast of chicken at dinner. She is doing 100 calorie peanut butter crackers for snacks. She denies obstacles in the next 2 weeks.  Subjective:   1. Insulin resistance Tamara Osborne's A1c wasn't done previously due to difficult blood draw. Last insulin level was 40.3.  2. Vitamin D deficiency Tamara Osborne denies nausea, vomiting, or muscle weakness, but she notes fatigue. She is on prescription Vit D.  3. Obstructive sleep apnea Tamara Osborne had a sleep study done about 4 years ago which qualified her for CPAP, but she never got the supplies.  Assessment/Plan:   1. Insulin resistance Tamara Osborne will continue to work on weight loss, exercise, and decreasing simple carbohydrates to help decrease the risk of diabetes. we will check A1c today. Tamara Osborne agreed to follow-up with [place] as directed to closely monitor her progress.  - Hemoglobin A1c  2. Vitamin D deficiency Low Vitamin D level contributes to fatigue and are associated with obesity, breast, and colon cancer. Tamara Osborne agreed to continue taking prescription Vitamin D 50,000 IU every week, no refill needed. She will follow-up for routine testing of Vitamin D, at least 2-3 times per year to avoid  over-replacement.  3. Obstructive sleep apnea Intensive lifestyle modifications are the first line treatment for this issue. We discussed several lifestyle modifications today. Tamara Osborne will continue to work on diet, exercise and weight loss efforts. We will continue to monitor. We will refer to [facility] for a sleep study. Orders and follow up as documented in patient record.   Counseling  Sleep apnea is a condition in which breathing pauses or becomes shallow during sleep. This happens over and over during the night. This disrupts your sleep and keeps your body from getting the rest that it needs, which can cause tiredness and lack of energy (fatigue) during the day.  Sleep apnea treatment: If you were given a device to open your airway while you sleep, USE IT!  Sleep hygiene:   Limit or avoid alcohol, caffeinated beverages, and cigarettes, especially close to bedtime.   Do not eat a large meal or eat spicy foods right before bedtime. This can lead to digestive discomfort that can make it hard for you to sleep.  Keep a sleep diary to help you and your health care provider figure out what could be causing your insomnia.  . Make your bedroom a dark, comfortable place where it is easy to fall asleep. ? Put up shades or blackout curtains to block light from outside. ? Use a white noise machine to block noise. ? Keep the temperature cool. . Limit screen use before bedtime. This includes: ? Watching TV. ? Using your smartphone, tablet, or computer. . Stick to a routine that includes going  to bed and waking up at the same times every day and night. This can help you fall asleep faster. Consider making a quiet activity, such as reading, part of your nighttime routine. . Try to avoid taking naps during the day so that you sleep better at night. . Get out of bed if you are still awake after 15 minutes of trying to sleep. Keep the lights down, but try reading or doing a quiet  activity. When you feel sleepy, go back to bed.  4. Class 2 severe obesity with serious comorbidity and body mass index (BMI) of 39.0 to 39.9 in adult, unspecified obesity type (HCC) Tamara Osborne is currently in the action stage of change. As such, her goal is to continue with weight loss efforts. She has agreed to the Category 2 Plan.   Exercise goals: All adults should avoid inactivity. Some physical activity is better than none, and adults who participate in any amount of physical activity gain some health benefits.  Behavioral modification strategies: increasing lean protein intake, increasing vegetables, meal planning and cooking strategies and keeping healthy foods in the home.  Natayla has agreed to follow-up with our clinic in 2 to 3 weeks. She was informed of the importance of frequent follow-up visits to maximize her success with intensive lifestyle modifications for her multiple health conditions.   Tamara Osborne was informed we would discuss her lab results at her next visit unless there is a critical issue that needs to be addressed sooner. Tamara Osborne agreed to keep her next visit at the agreed upon time to discuss these results.  Objective:   Blood pressure 129/83, pulse (!) 109, temperature (!) 97.4 F (36.3 C), temperature source Oral, height 5\' 1"  (1.549 m), weight 210 lb (95.3 kg), SpO2 97 %. Body mass index is 39.68 kg/m.  General: Cooperative, alert, well developed, in no acute distress. HEENT: Conjunctivae and lids unremarkable. Cardiovascular: Regular rhythm.  Lungs: Normal work of breathing. Neurologic: No focal deficits.   Lab Results  Component Value Date   CREATININE 1.35 (H) 09/20/2019   BUN 20 09/20/2019   NA 140 09/20/2019   K 4.5 09/20/2019   CL 105 09/20/2019   CO2 20 09/20/2019   Lab Results  Component Value Date   ALT 29 09/20/2019   AST 20 09/20/2019   ALKPHOS 111 09/20/2019   BILITOT 0.3 09/20/2019   Lab Results  Component Value Date   HGBA1C  CANCELED 09/20/2019   Lab Results  Component Value Date   INSULIN 40.3 (H) 09/20/2019   Lab Results  Component Value Date   TSH 1.910 09/20/2019   Lab Results  Component Value Date   CHOL 222 (H) 09/20/2019   HDL 34 (L) 09/20/2019   LDLCALC 158 (H) 09/20/2019   TRIG 162 (H) 09/20/2019   Lab Results  Component Value Date   WBC 6.6 08/07/2019   HGB 11.8 (L) 08/07/2019   HCT 34.8 (L) 08/07/2019   MCV 87.7 08/07/2019   PLT 267 08/07/2019   Lab Results  Component Value Date   IRON 17 (L) 03/27/2019   TIBC 283 03/27/2019   FERRITIN 32 03/27/2019   Attestation Statements:   Reviewed by clinician on day of visit: allergies, medications, problem list, medical history, surgical history, family history, social history, and previous encounter notes.   I, Trixie Dredge, am acting as transcriptionist for Coralie Common, MD. I have reviewed the above documentation for accuracy and completeness, and I agree with the above. - Jinny Blossom, MD

## 2019-10-20 ENCOUNTER — Telehealth: Payer: Self-pay

## 2019-10-20 NOTE — Telephone Encounter (Signed)
She called and left a message to call her.  Called back. She is going to [facility] app[facility] and they are unable to draw her labs. She is asking if Dr. Alvy Bimler could order labs and flush appt. She will get [facility] to fax a order with labs needed.

## 2019-10-23 ENCOUNTER — Encounter (INDEPENDENT_AMBULATORY_CARE_PROVIDER_SITE_OTHER): Payer: Self-pay | Admitting: Family Medicine

## 2019-10-23 ENCOUNTER — Other Ambulatory Visit: Payer: Self-pay

## 2019-10-23 DIAGNOSIS — E669 Obesity, unspecified: Secondary | ICD-10-CM

## 2019-10-23 NOTE — Telephone Encounter (Signed)
1) we can order it  2) ask when the labs are needed and then schedule it. I do not need to see her for this

## 2019-10-23 NOTE — Telephone Encounter (Signed)
Please advise 

## 2019-10-23 NOTE — Telephone Encounter (Signed)
Called and given below message. She verbalized understanding. Appts scheduled and she is aware of date and time.

## 2019-10-25 ENCOUNTER — Other Ambulatory Visit: Payer: Self-pay

## 2019-10-25 ENCOUNTER — Ambulatory Visit (HOSPITAL_COMMUNITY): Payer: Medicaid Other | Attending: Cardiology

## 2019-10-25 DIAGNOSIS — I10 Essential (primary) hypertension: Secondary | ICD-10-CM | POA: Insufficient documentation

## 2019-10-25 LAB — ECHOCARDIOGRAM COMPLETE: S' Lateral: 3.8 cm

## 2019-10-25 NOTE — Progress Notes (Signed)
Patient ID: Tamara Osborne, female   DOB: [birth year], 44 y.o.   MRN: [id number]   Per order - Did not administer Definity.

## 2019-10-30 ENCOUNTER — Inpatient Hospital Stay: Payer: Medicaid Other

## 2019-10-30 ENCOUNTER — Other Ambulatory Visit: Payer: Self-pay

## 2019-10-30 DIAGNOSIS — E669 Obesity, unspecified: Secondary | ICD-10-CM

## 2019-10-30 DIAGNOSIS — Z95828 Presence of other vascular implants and grafts: Secondary | ICD-10-CM

## 2019-10-30 DIAGNOSIS — C539 Malignant neoplasm of cervix uteri, unspecified: Secondary | ICD-10-CM | POA: Diagnosis not present

## 2019-10-30 LAB — HEMOGLOBIN A1C
Hgb A1c MFr Bld: 5.2 % (ref 4.8–5.6)
Mean Plasma Glucose: 102.54 mg/dL

## 2019-10-30 MED ORDER — HEPARIN SOD (PORK) LOCK FLUSH 100 UNIT/ML IV SOLN
500.0000 [IU] | Freq: Once | INTRAVENOUS | Status: AC
  Administered 2019-10-30: 500 [IU] via INTRAVENOUS
  Filled 2019-10-30: qty 5

## 2019-10-30 MED ORDER — SODIUM CHLORIDE 0.9% FLUSH
10.0000 mL | INTRAVENOUS | Status: DC | PRN
  Administered 2019-10-30: 10 mL via INTRAVENOUS
  Filled 2019-10-30: qty 10

## 2019-10-30 NOTE — Patient Instructions (Signed)

## 2019-11-02 ENCOUNTER — Ambulatory Visit (INDEPENDENT_AMBULATORY_CARE_PROVIDER_SITE_OTHER): Payer: Medicaid Other | Admitting: Family Medicine

## 2019-11-02 ENCOUNTER — Encounter (INDEPENDENT_AMBULATORY_CARE_PROVIDER_SITE_OTHER): Payer: Self-pay | Admitting: Family Medicine

## 2019-11-02 ENCOUNTER — Other Ambulatory Visit: Payer: Self-pay

## 2019-11-02 VITALS — BP 138/75 | HR 95 | Temp 98.0°F | Ht 61.0 in | Wt 209.0 lb

## 2019-11-02 DIAGNOSIS — R0602 Shortness of breath: Secondary | ICD-10-CM | POA: Diagnosis not present

## 2019-11-02 DIAGNOSIS — E8881 Metabolic syndrome: Secondary | ICD-10-CM

## 2019-11-02 DIAGNOSIS — Z6839 Body mass index (BMI) 39.0-39.9, adult: Secondary | ICD-10-CM

## 2019-11-02 DIAGNOSIS — E7849 Other hyperlipidemia: Secondary | ICD-10-CM

## 2019-11-02 MED ORDER — BD PEN NEEDLE NANO 2ND GEN 32G X 4 MM MISC: 1.0000 | Freq: Two times a day (BID) | 0 refills | Status: DC

## 2019-11-02 MED ORDER — VICTOZA 18 MG/3ML ~~LOC~~ SOPN: 0.6000 mg | PEN_INJECTOR | Freq: Every day | SUBCUTANEOUS | 0 refills | Status: DC

## 2019-11-07 NOTE — Progress Notes (Signed)
Chief Complaint:   OBESITY Tamara Osborne is here to discuss her progress with her obesity treatment plan along with follow-up of her obesity related diagnoses. Tamara Osborne is on the Category 2 Plan and states she is following her eating plan approximately 100% of the time. Tamara Osborne states she is doing Zoom exercise class for 60 minutes 1 time per week.  Today's visit was #: 4 Starting weight: 208 lbs Starting date: 09/20/2019 Today's weight: 209 lbs Today's date: 11/02/2019 Total lbs lost to date: 0 Total lbs lost since last in-office visit: 1  Interim History: Tamara Osborne voices the last few weeks she has been almost entirely on the plan. For breakfast she is doing eggs, 1 piece of toast, blackberries, and baby bell cheeses. She is doing orange juice or water in the AM, and deli meat for lunch and dinner occasionally.  Subjective:   1. Insulin resistance Tamara Osborne last A1c was 5.2 and insulin 40.3. She still notes occasional propensity for sweets.  2. Other hyperlipidemia Tamara Osborne last LDL was 158, HDL 34, triglycerides 162. She is not on statin.  3. SOB (shortness of breath) on exertion Tamara Osborne echo was showing EF 60-65% with Grade I diastolic dysfunction. She has had an initial appointment with Neurology.  Assessment/Plan:   1. Insulin resistance Tamara Osborne will continue to work on weight loss, exercise, and decreasing simple carbohydrates to help decrease the risk of diabetes. Tamara Osborne agreed to start Victoza 0.6 mg SubQ daily #2 pens with no refills, and insulin pen needles #100 with no refills. Tamara Osborne agreed to follow-up with [place] as directed to closely monitor her progress.  - liraglutide (VICTOZA) 18 MG/3ML SOPN; Inject 0.1 mLs (0.6 mg total) into the skin daily.  Dispense: 9 mL; Refill: 0 - Insulin Pen Needle (BD PEN NEEDLE NANO 2ND GEN) 32G X 4 MM MISC; 1 Package by Does not apply route 2 (two) times daily.  Dispense: 100 each; Refill: 0  2. Other  hyperlipidemia Cardiovascular risk and specific lipid/LDL goals reviewed. We discussed several lifestyle modifications today. Tamara Osborne will continue to work on diet, exercise and weight loss efforts. We will repeat labs in 2 months. Orders and follow up as documented in patient record.   Counseling Intensive lifestyle modifications are the first line treatment for this issue. . Dietary changes: Increase soluble fiber. Decrease simple carbohydrates. . Exercise changes: Moderate to vigorous-intensity aerobic activity 150 minutes per week if tolerated. . Lipid-lowering medications: see documented in medical record.  3. SOB (shortness of breath) on exertion Tamara Osborne is to follow up for a sleep study in the upcoming weeks.  4. Class 2 severe obesity with serious comorbidity and body mass index (BMI) of 39.0 to 39.9 in adult, unspecified obesity type (HCC) Tamara Osborne is currently in the action stage of change. As such, her goal is to continue with weight loss efforts. She has agreed to the Category 2 Plan.   Exercise goals: All adults should avoid inactivity. Some physical activity is better than none, and adults who participate in any amount of physical activity gain some health benefits.  Behavioral modification strategies: increasing lean protein intake, increasing vegetables, meal planning and cooking strategies, keeping healthy foods in the home and planning for success.  Tamara Osborne has agreed to follow-up with our clinic in 2 weeks. She was informed of the importance of frequent follow-up visits to maximize her success with intensive lifestyle modifications for her multiple health conditions.   Objective:   Blood pressure 138/75, pulse 95, temperature 98 F (36.7 C),  temperature source Oral, height 5\' 1"  (1.549 m), weight 209 lb (94.8 kg), SpO2 99 %. Body mass index is 39.49 kg/m.  General: Cooperative, alert, well developed, in no acute distress. HEENT: Conjunctivae and lids  unremarkable. Cardiovascular: Regular rhythm.  Lungs: Normal work of breathing. Neurologic: No focal deficits.   Lab Results  Component Value Date   CREATININE 1.35 (H) 09/20/2019   BUN 20 09/20/2019   NA 140 09/20/2019   K 4.5 09/20/2019   CL 105 09/20/2019   CO2 20 09/20/2019   Lab Results  Component Value Date   ALT 29 09/20/2019   AST 20 09/20/2019   ALKPHOS 111 09/20/2019   BILITOT 0.3 09/20/2019   Lab Results  Component Value Date   HGBA1C 5.2 10/30/2019   HGBA1C CANCELED 09/20/2019   Lab Results  Component Value Date   INSULIN 40.3 (H) 09/20/2019   Lab Results  Component Value Date   TSH 1.910 09/20/2019   Lab Results  Component Value Date   CHOL 222 (H) 09/20/2019   HDL 34 (L) 09/20/2019   LDLCALC 158 (H) 09/20/2019   TRIG 162 (H) 09/20/2019   Lab Results  Component Value Date   WBC 6.6 08/07/2019   HGB 11.8 (L) 08/07/2019   HCT 34.8 (L) 08/07/2019   MCV 87.7 08/07/2019   PLT 267 08/07/2019   Lab Results  Component Value Date   IRON 17 (L) 03/27/2019   TIBC 283 03/27/2019   FERRITIN 32 03/27/2019   Attestation Statements:   Reviewed by clinician on day of visit: allergies, medications, problem list, medical history, surgical history, family history, social history, and previous encounter notes.  Time spent on visit including pre-visit chart review and post-visit care and charting was 22 minutes.    I, Trixie Dredge, am acting as transcriptionist for Coralie Common, MD.  I have reviewed the above documentation for accuracy and completeness, and I agree with the above. - Jinny Blossom, MD

## 2019-11-08 ENCOUNTER — Other Ambulatory Visit (INDEPENDENT_AMBULATORY_CARE_PROVIDER_SITE_OTHER): Payer: Self-pay | Admitting: Family Medicine

## 2019-11-08 DIAGNOSIS — E559 Vitamin D deficiency, unspecified: Secondary | ICD-10-CM

## 2019-11-16 ENCOUNTER — Ambulatory Visit (INDEPENDENT_AMBULATORY_CARE_PROVIDER_SITE_OTHER): Payer: Medicaid Other | Admitting: Family Medicine

## 2019-11-21 ENCOUNTER — Encounter: Payer: Self-pay | Admitting: Neurology

## 2019-11-21 ENCOUNTER — Encounter (INDEPENDENT_AMBULATORY_CARE_PROVIDER_SITE_OTHER): Payer: Self-pay | Admitting: Family Medicine

## 2019-11-21 ENCOUNTER — Ambulatory Visit (INDEPENDENT_AMBULATORY_CARE_PROVIDER_SITE_OTHER): Payer: Medicaid Other | Admitting: Family Medicine

## 2019-11-21 ENCOUNTER — Ambulatory Visit: Payer: Medicaid Other | Admitting: Neurology

## 2019-11-21 ENCOUNTER — Other Ambulatory Visit: Payer: Self-pay

## 2019-11-21 VITALS — BP 134/83 | HR 95 | Temp 98.1°F | Ht 61.0 in | Wt 210.0 lb

## 2019-11-21 VITALS — BP 134/86 | HR 102 | Ht 62.0 in | Wt 215.3 lb

## 2019-11-21 DIAGNOSIS — E669 Obesity, unspecified: Secondary | ICD-10-CM

## 2019-11-21 DIAGNOSIS — R5383 Other fatigue: Secondary | ICD-10-CM | POA: Diagnosis not present

## 2019-11-21 DIAGNOSIS — R0683 Snoring: Secondary | ICD-10-CM | POA: Diagnosis not present

## 2019-11-21 DIAGNOSIS — G4733 Obstructive sleep apnea (adult) (pediatric): Secondary | ICD-10-CM | POA: Diagnosis not present

## 2019-11-21 DIAGNOSIS — G4719 Other hypersomnia: Secondary | ICD-10-CM

## 2019-11-21 DIAGNOSIS — E8881 Metabolic syndrome: Secondary | ICD-10-CM

## 2019-11-21 DIAGNOSIS — Z6839 Body mass index (BMI) 39.0-39.9, adult: Secondary | ICD-10-CM

## 2019-11-21 DIAGNOSIS — E559 Vitamin D deficiency, unspecified: Secondary | ICD-10-CM

## 2019-11-21 DIAGNOSIS — R351 Nocturia: Secondary | ICD-10-CM

## 2019-11-21 MED ORDER — VITAMIN D (ERGOCALCIFEROL) 1.25 MG (50000 UNIT) PO CAPS: 50000.0000 [IU] | ORAL_CAPSULE | ORAL | 0 refills | Status: DC

## 2019-11-21 NOTE — Progress Notes (Signed)
Subjective:    Patient ID: Tamara Osborne is a 44 y.o. female.  HPI     Star Age, MD, PhD [facility] [address], [address] [place], [place] [address]  Dear Dr. Adair Patter,   I saw your patient, Tamara Osborne, upon your kind request in my sleep clinic today for initial consultation of her sleep disorder, in particular evaluation of her prior diagnosis of obstructive sleep apnea.  The patient is unaccompanied today.  As you know, Tamara Osborne is a 44 year old right-handed woman with an underlying medical history of palpitations, hypertension, neuropathy, diabetes, history of cervical cancer, anxiety, anemia, and obesity, who was diagnosed with obstructive sleep apnea several years ago.  She reports significant daytime somnolence.  Prior sleep study results are not available for my review today.  She reports that sleep study testing was done in [place] about 7 or 8 years ago.  She qualified for CPAP therapy but her insurance would not cover it at the time.  She has had weight fluctuations.  She was diagnosed with cervical cancer earlier in the year and underwent surgery, radiation and chemotherapy.  She had lost weight, down to 180 pounds but gained a lot of weight back.  She is in the process of trying to lose weight.  She recently started Victoza. I reviewed your office note from 10/19/2019.  Her Epworth sleepiness score is 14 out of 24 today, fatigue severity score is 32 out of 63.  His snoring is loud and disturbs her boyfriend.  She lives with her boyfriend, they have 1 dog in the household.  She has 3 grown children, ages 44, 46 and 42.  She is currently not working.  She is hoping to go back to work after October.  She has an appointment with her oncologist in early October.  Bedtime is around 9 or 9:30 PM and rise time generally around 8 or 9.  She does wake up around 5 or 5:30 AM to let the dog out but goes back to bed.  She has nocturia about once per  average night, she denies recurrent morning headaches.  She has 1 niece with sleep apnea.  She is trying to reduce her caffeine intake.  She drinks approximately 1 serving per day currently.  She does not drink alcohol, she is a non-smoker.  She would be willing to get rechecked for sleep apnea and consider CPAP therapy.  Her Past Medical History Is Significant For: Past Medical History:  Diagnosis Date  . Anemia   . Anxiety   . Bartholin cyst   . Cervical cancer ([place])   . Constipation   . Depression   . Deviated septum   . Diabetes mellitus without complication ([place])   . Difficult intravenous access    used port for 05-09-2019 surgery  . Fibroids   . History of blood transfusion    2 units given 04-26-2019, has had total of 8 units since jan 2021  . History of blood transfusion 1 unit given 05-30-19   iv fluids also given  . History of kidney problems   . History of recent blood transfusion 05/16/2019   2 units given  per dr Pollyann Savoy at cancer center  . Hypertension   . Neuropathy    both thumbs  . Obesity   . Palpitations   . PONV (postoperative nausea and vomiting)   . Prediabetes   . Preeclampsia 2006  . Sleep apnea    no cpap used insurance  would not cover, osa severe per pt  . SOB (shortness of breath)   . Swallowing difficulty     Her Past Surgical History Is Significant For: Past Surgical History:  Procedure Laterality Date  . CESAREAN SECTION WITH BILATERAL TUBAL LIGATION  11/10/2004      . DILATION AND CURETTAGE OF UTERUS  1997  . IR IMAGING GUIDED PORT INSERTION  04/04/2019  . OPERATIVE ULTRASOUND N/A 05/09/2019   Procedure: OPERATIVE ULTRASOUND;  Surgeon: Gery Pray, MD;  Location: [facility];  Service: Urology;  Laterality: N/A;  . OPERATIVE ULTRASOUND N/A 05/15/2019   Procedure: OPERATIVE ULTRASOUND;  Surgeon: Gery Pray, MD;  Location: [facility];  Service: Urology;  Laterality: N/A;  . OPERATIVE ULTRASOUND N/A 05/22/2019    Procedure: OPERATIVE ULTRASOUND;  Surgeon: Gery Pray, MD;  Location: [facility];  Service: Urology;  Laterality: N/A;  . OPERATIVE ULTRASOUND N/A 05/29/2019   Procedure: OPERATIVE ULTRASOUND;  Surgeon: Gery Pray, MD;  Location: [facility];  Service: Urology;  Laterality: N/A;  . OPERATIVE ULTRASOUND N/A 06/05/2019   Procedure: OPERATIVE ULTRASOUND;  Surgeon: Gery Pray, MD;  Location: [facility];  Service: Urology;  Laterality: N/A;  . TANDEM RING INSERTION N/A 05/09/2019   Procedure: TANDEM RING INSERTION;  Surgeon: Gery Pray, MD;  Location: [facility];  Service: Urology;  Laterality: N/A;  . TANDEM RING INSERTION N/A 05/15/2019   Procedure: TANDEM RING INSERTION;  Surgeon: Gery Pray, MD;  Location: [facility];  Service: Urology;  Laterality: N/A;  . TANDEM RING INSERTION N/A 05/22/2019   Procedure: TANDEM RING INSERTION;  Surgeon: Gery Pray, MD;  Location: [place] [facility];  Service: Urology;  Laterality: N/A;  . TANDEM RING INSERTION N/A 05/29/2019   Procedure: TANDEM RING INSERTION;  Surgeon: Gery Pray, MD;  Location: [facility];  Service: Urology;  Laterality: N/A;  . TANDEM RING INSERTION N/A 06/05/2019   Procedure: TANDEM RING INSERTION;  Surgeon: Gery Pray, MD;  Location: [facility];  Service: Urology;  Laterality: N/A;    Her Family History Is Significant For: Family History  Problem Relation Age of Onset  . Brain cancer Mother        lung cancer to brain  . Hypertension Mother   . Cancer Mother   . Depression Mother   . Anxiety disorder Mother   . Diabetes Father   . Kidney disease Father   . Cancer Father        kidney cancer  . Heart failure Sister     Her Social History Is Significant For: Social History   Socioeconomic History  . Marital status: Single    Spouse name: Not on file  . Number of children: 4  . Years of  education: Not on file  . Highest education level: Not on file  Occupational History  . Not on file  Tobacco Use  . Smoking status: Never Smoker  . Smokeless tobacco: Never Used  Vaping Use  . Vaping Use: Never used  Substance and Sexual Activity  . Alcohol use: No  . Drug use: No  . Sexual activity: Not Currently    Birth control/protection: Surgical  Other Topics Concern  . Not on file  Social History Narrative   Lives with her boyfriend   Social Determinants of Health   Financial Resource Strain:   . Difficulty of Paying Living Expenses: Not on file  Food Insecurity:   .  Worried About Charity fundraiser in the Last Year: Not on file  . Ran Out of Food in the Last Year: Not on file  Transportation Needs:   . Lack of Transportation (Medical): Not on file  . Lack of Transportation (Non-Medical): Not on file  Physical Activity:   . Days of Exercise per Week: Not on file  . Minutes of Exercise per Session: Not on file  Stress:   . Feeling of Stress : Not on file  Social Connections:   . Frequency of Communication with Friends and Family: Not on file  . Frequency of Social Gatherings with Friends and Family: Not on file  . Attends Religious Services: Not on file  . Active Member of Clubs or Organizations: Not on file  . Attends Archivist Meetings: Not on file  . Marital Status: Not on file    Her Allergies Are:  Allergies  Allergen Reactions  . Penicillins     Not sure a child allergy  . Shellfish Allergy Swelling    Seafood also any kind  . Sulfa Antibiotics     Not sure a child allergy  :   Her Current Medications Are:  Outpatient Encounter Medications as of 11/21/2019  Medication Sig  . ASHWAGANDHA PO Take by mouth. Blue [place] Gummies  . fluticasone (FLONASE) 50 MCG/ACT nasal spray Place 1 spray into both nostrils daily as needed for allergies or rhinitis.   . Insulin Pen Needle (BD PEN NEEDLE NANO 2ND GEN) 32G X 4 MM MISC 1 Package by Does not  apply route 2 (two) times daily.  Marland Kitchen liraglutide (VICTOZA) 18 MG/3ML SOPN Inject 0.1 mLs (0.6 mg total) into the skin daily.  . Nutritional Supplements (NUTRITIONAL SUPPLEMENT PO) Take by mouth.  . valsartan (DIOVAN) 160 MG tablet Take 160 mg by mouth daily.  . Vitamin D, Ergocalciferol, (DRISDOL) 1.25 MG (50000 UNIT) CAPS capsule Take 1 capsule (50,000 Units total) by mouth every 7 (seven) days.  Marland Kitchen amLODipine (NORVASC) 10 MG tablet Take 1 tablet (10 mg total) by mouth daily. (Patient taking differently: Take 10 mg by mouth every evening. )   No facility-administered encounter medications on file as of 11/21/2019.  :  Review of Systems:  Out of a complete 14 point review of systems, all are reviewed and negative with the exception of these symptoms as listed below: Review of Systems  Neurological:       Here for sleep consult. Prior sleep study 5 + years ago. Pt was advised at that time osa was present but her insurance at the time would not cover cpap. Pt is here to re assess the need for cpap. She reports she does snore at night.  Epworth Sleepiness Scale 0= would never doze 1= slight chance of dozing 2= moderate chance of dozing 3= high chance of dozing  Sitting and reading:2 Watching TV:3 Sitting inactive in a public place (ex. Theater or meeting):2 As a passenger in a car for an hour without a break:1 Lying down to rest in the afternoon:2 Sitting and talking to someone:2 Sitting quietly after lunch (no alcohol):2 In a car, while stopped in traffic:0 Total:14    Objective:  Neurological Exam  Physical Exam Physical Examination:   Vitals:   11/21/19 1507  BP: 134/86  Pulse: (!) 102  SpO2: 97%    General Examination: The patient is a very pleasant 44 y.o. female in no acute distress. She appears well-developed and well-nourished and well groomed.   HEENT:  Normocephalic, atraumatic, pupils are equal, round and reactive to light, extraocular tracking is good without  limitation to gaze excursion or nystagmus noted. Hearing is grossly intact. Face is symmetric with normal facial animation. Speech is clear with no dysarthria noted. There is no hypophonia. There is no lip, neck/head, jaw or voice tremor. Neck is supple with full range of passive and active motion. There are no carotid bruits on auscultation. Oropharynx exam reveals: mild mouth dryness, adequate dental hygiene and marked airway crowding secondary to small airway, tonsillar size of 2+, prominent uvula, Mallampati class III.  Neck circumference of 19-1/2 inches.  She has a mild overbite.  Tongue protrudes centrally in palate elevates symmetrically.    Chest: Clear to auscultation without wheezing, rhonchi or crackles noted.  Benign appearing port in the right upper chest.  Heart: S1+S2+0, regular and normal without murmurs, rubs or gallops noted.   Abdomen: Soft, non-tender and non-distended with normal bowel sounds appreciated on auscultation.  Extremities: There is no pitting edema in the distal lower extremities bilaterally.   Skin: Warm and dry without trophic changes noted.   Musculoskeletal: exam reveals no obvious joint deformities, tenderness or joint swelling or erythema.   Neurologically:  Mental status: The patient is awake, alert and oriented in all 4 spheres. Her immediate and remote memory, attention, language skills and fund of knowledge are appropriate. There is no evidence of aphasia, agnosia, apraxia or anomia. Speech is clear with normal prosody and enunciation. Thought process is linear. Mood is normal and affect is normal.  Cranial nerves II - XII are as described above under HEENT exam.  Motor exam: Normal bulk, strength and tone is noted. There is no tremor. Fine motor skills and coordination: grossly intact.  Cerebellar testing: No dysmetria or intention tremor. There is no truncal or gait ataxia.  Sensory exam: intact to light touch in the upper and lower extremities.   Gait, station and balance: She stands easily. No veering to one side is noted. No leaning to one side is noted. Posture is age-appropriate and stance is narrow based. Gait shows normal stride length and normal pace. No problems turning are noted.  Assessment and Plan:  In summary, Tamara Osborne is a very pleasant 44 y.o.-year old female with an underlying medical history of palpitations, hypertension, neuropathy, diabetes, history of cervical cancer, anxiety, anemia, and obesity, who presents for evaluation of her sleep disorder, particularly her prior diagnosis of obstructive sleep apnea.  She has not been on CPAP therapy and sleep [place].   I had a long chat with the patient about my findings and the diagnosis of OSA, its prognosis and treatment options. We talked about medical treatments, surgical interventions and non-pharmacological approaches. I explained in particular the risks and ramifications of untreated moderate to severe OSA, especially with respect to developing cardiovascular disease down the Road, including congestive heart failure, difficult to treat hypertension, cardiac arrhythmias, or stroke. Even type 2 diabetes has, in part, been linked to untreated OSA. Symptoms of untreated OSA include daytime sleepiness, memory problems, mood irritability and mood disorder such as depression and anxiety, lack of energy, as well as recurrent headaches, especially morning headaches. We talked about trying to maintain a healthy lifestyle in general, as well as the importance of weight control. We also talked about the importance of good sleep hygiene. I recommended the following at this time: sleep study.   I explained the sleep test procedure to the  patient and also outlined possible surgical and non-surgical treatment options of OSA, including the use of a custom-made dental device (which would require a referral to a specialist dentist or oral surgeon),  upper airway surgical options, such as traditional UPPP or a novel less invasive surgical option in the form of Inspire hypoglossal nerve stimulation (which would involve a referral to an ENT surgeon). I also explained the CPAP treatment option to the patient, who indicated that she would be willing to try CPAP if the need arises. I answered all she questions today and the patient was in agreement. I plan to see her back after the sleep study is completed and encouraged her to call with any interim questions, concerns, problems or updates.   Thank you very much for allowing me to participate in the care of this nice patient. If I can be of any further assistance to you please do not hesitate to call me at [phone].  Sincerely,   Star Age, MD, PhD

## 2019-11-21 NOTE — Patient Instructions (Signed)

## 2019-11-22 ENCOUNTER — Institutional Professional Consult (permissible substitution): Payer: Medicaid Other | Admitting: Neurology

## 2019-11-22 NOTE — Progress Notes (Signed)
Chief Complaint:   OBESITY Tamara Osborne is here to discuss her progress with her obesity treatment plan along with follow-up of her obesity related diagnoses. Tamara Osborne is on the Category 2 Plan and states she is following her eating plan approximately 80% of the time. Tamara Osborne states she is walking the dog 1/4-1/2 mile 7 times per week.  Today's visit was #: 5 Starting weight: 208 lbs Starting date: 09/20/2019 Today's weight: 210 lbs Today's date: 11/21/2019 Total lbs lost to date: 0 Total lbs lost since last in-office visit: 0  Interim History: Tamara Osborne felt nauseous with inititiation of Victoza and had to force herself to eat. Last week she felt she was getting everything in. She is not hungry for dinner most days. She is doing a significant amount of chicken. She is doing two 100 calories snacks.  Subjective:   1. Vitamin D deficiency Tamara Osborne denies nausea, vomiting, or muscle weakness, but she notes fatigue. Last Vit D level was 29.2.  2. Insulin resistance Tamara Osborne is on Victoza 0.6 mg, and she notes significant decrease in appetite and some nausea.  3. Other fatigue Tamara Osborne's first sleep study appointment is today. She was previously told she had obstructive sleep apnea, but no results are available.  Assessment/Plan:   1. Vitamin D deficiency Low Vitamin D level contributes to fatigue and are associated with obesity, breast, and colon cancer. We will refill prescription Vitamin D for 1 month. Tamara Osborne will follow-up for routine testing of Vitamin D, at least 2-3 times per year to avoid over-replacement.  - Vitamin D, Ergocalciferol, (DRISDOL) 1.25 MG (50000 UNIT) CAPS capsule; Take 1 capsule (50,000 Units total) by mouth every 7 (seven) days.  Dispense: 4 capsule; Refill: 0  2. Insulin resistance Tamara Osborne will continue to work on weight loss, exercise, and decreasing simple carbohydrates to help decrease the risk of diabetes. Tamara Osborne agreed to increase Victoza to 0.9 mg  SubQ daily, no refill needed. Tamara Osborne agreed to follow-up with [place] as directed to closely monitor her progress.  3. Other fatigue We will follow up at [facility] next appointment.  4. Class 2 severe obesity with serious comorbidity and body mass index (BMI) of 39.0 to 39.9 in adult, unspecified obesity type (HCC) Tamara Osborne is currently in the action stage of change. As such, her goal is to continue with weight loss efforts. She has agreed to the Category 1 Plan + 100 calories.   Exercise goals: No exercise has been prescribed at this time.  Behavioral modification strategies: increasing lean protein intake, meal planning and cooking strategies, keeping healthy foods in the home and planning for success.  Tamara Osborne has agreed to follow-up with our clinic in 2 weeks. She was informed of the importance of frequent follow-up visits to maximize her success with intensive lifestyle modifications for her multiple health conditions.   Objective:   Blood pressure 134/83, pulse 95, temperature 98.1 F (36.7 C), temperature source Oral, height 5\' 1"  (1.549 m), weight 210 lb (95.3 kg), SpO2 97 %. Body mass index is 39.68 kg/m.  General: Cooperative, alert, well developed, in no acute distress. HEENT: Conjunctivae and lids unremarkable. Cardiovascular: Regular rhythm.  Lungs: Normal work of breathing. Neurologic: No focal deficits.   Lab Results  Component Value Date   CREATININE 1.35 (H) 09/20/2019   BUN 20 09/20/2019   NA 140 09/20/2019   K 4.5 09/20/2019   CL 105 09/20/2019   CO2 20 09/20/2019   Lab Results  Component Value Date   ALT 29 09/20/2019  AST 20 09/20/2019   ALKPHOS 111 09/20/2019   BILITOT 0.3 09/20/2019   Lab Results  Component Value Date   HGBA1C 5.2 10/30/2019   HGBA1C CANCELED 09/20/2019   Lab Results  Component Value Date   INSULIN 40.3 (H) 09/20/2019   Lab Results  Component Value Date   TSH 1.910 09/20/2019   Lab Results  Component Value Date   CHOL  222 (H) 09/20/2019   HDL 34 (L) 09/20/2019   LDLCALC 158 (H) 09/20/2019   TRIG 162 (H) 09/20/2019   Lab Results  Component Value Date   WBC 6.6 08/07/2019   HGB 11.8 (L) 08/07/2019   HCT 34.8 (L) 08/07/2019   MCV 87.7 08/07/2019   PLT 267 08/07/2019   Lab Results  Component Value Date   IRON 17 (L) 03/27/2019   TIBC 283 03/27/2019   FERRITIN 32 03/27/2019   Attestation Statements:   Reviewed by clinician on day of visit: allergies, medications, problem list, medical history, surgical history, family history, social history, and previous encounter notes.  Time spent on visit including pre-visit chart review and post-visit care and charting was 18 minutes.    I, Trixie Dredge, am acting as transcriptionist for Coralie Common, MD.  I have reviewed the above documentation for accuracy and completeness, and I agree with the above. - Jinny Blossom, MD

## 2019-11-27 ENCOUNTER — Other Ambulatory Visit: Payer: Self-pay

## 2019-11-27 ENCOUNTER — Inpatient Hospital Stay: Payer: Medicaid Other | Attending: Hematology and Oncology

## 2019-11-27 DIAGNOSIS — C539 Malignant neoplasm of cervix uteri, unspecified: Secondary | ICD-10-CM | POA: Diagnosis present

## 2019-11-27 DIAGNOSIS — Z95828 Presence of other vascular implants and grafts: Secondary | ICD-10-CM

## 2019-11-27 MED ORDER — HEPARIN SOD (PORK) LOCK FLUSH 100 UNIT/ML IV SOLN
500.0000 [IU] | Freq: Once | INTRAVENOUS | Status: AC
  Administered 2019-11-27: 500 [IU] via INTRAVENOUS
  Filled 2019-11-27: qty 5

## 2019-11-27 MED ORDER — SODIUM CHLORIDE 0.9% FLUSH
10.0000 mL | Freq: Once | INTRAVENOUS | Status: AC
  Administered 2019-11-27: 10 mL via INTRAVENOUS
  Filled 2019-11-27: qty 10

## 2019-11-27 NOTE — Patient Instructions (Signed)

## 2019-12-05 ENCOUNTER — Ambulatory Visit (INDEPENDENT_AMBULATORY_CARE_PROVIDER_SITE_OTHER): Payer: Medicaid Other | Admitting: Family Medicine

## 2019-12-05 ENCOUNTER — Encounter (INDEPENDENT_AMBULATORY_CARE_PROVIDER_SITE_OTHER): Payer: Self-pay | Admitting: Family Medicine

## 2019-12-05 ENCOUNTER — Other Ambulatory Visit: Payer: Self-pay

## 2019-12-05 VITALS — BP 156/98 | HR 84 | Temp 98.3°F | Ht 62.0 in | Wt 212.0 lb

## 2019-12-05 DIAGNOSIS — R030 Elevated blood-pressure reading, without diagnosis of hypertension: Secondary | ICD-10-CM

## 2019-12-05 DIAGNOSIS — E559 Vitamin D deficiency, unspecified: Secondary | ICD-10-CM

## 2019-12-05 DIAGNOSIS — R5383 Other fatigue: Secondary | ICD-10-CM | POA: Diagnosis not present

## 2019-12-05 DIAGNOSIS — E8881 Metabolic syndrome: Secondary | ICD-10-CM

## 2019-12-05 DIAGNOSIS — Z6841 Body Mass Index (BMI) 40.0 and over, adult: Secondary | ICD-10-CM

## 2019-12-05 MED ORDER — VITAMIN D (ERGOCALCIFEROL) 1.25 MG (50000 UNIT) PO CAPS: 50000.0000 [IU] | ORAL_CAPSULE | ORAL | 0 refills | Status: DC

## 2019-12-05 MED ORDER — VICTOZA 18 MG/3ML ~~LOC~~ SOPN: 1.2000 mg | PEN_INJECTOR | Freq: Every day | SUBCUTANEOUS | 0 refills | Status: DC

## 2019-12-05 NOTE — Progress Notes (Signed)
Chief Complaint:   OBESITY Tamara Osborne is here to discuss her progress with her obesity treatment plan along with follow-up of her obesity related diagnoses. Tamara Osborne is on the Category 1 Plan + 100 calories and states she is following her eating plan approximately 85% of the time. Tamara Osborne states she is walking for 60 minutes 7 times per week.  Today's visit was #: 6 Starting weight: 208 lbs Starting date: 09/20/2019 Today's weight: 212 lbs Today's date: 12/05/2019 Total lbs lost to date: 0 Total lbs lost since last in-office visit: 0  Interim History: Tamara Osborne voices for snack calories she is doing yogurt and cheese and crackers. She is doing 2 scrambled eggs with 40 calorie bread. Lunch is a sandwich, berries, crackers, and dinner is vegetables and chicken or ground [place]. She is feeling better overall. She was drinking a good amount of orange juice previously.  Subjective:   1. Insulin resistance Tamara Osborne is on Victoza 0.9 mg SubQ daily. She denies cravings or hunger.  2. Other fatigue Tamara Osborne is going for a sleep study in 6 days. She notes significant daytime sleepiness.  3. Elevated BP without diagnosis of hypertension Tamara Osborne's blood pressure is elevated today. She denies chest pain, chest pressure, or headache. She forgot to take her medications today.  4. Vitamin D deficiency Tamara Osborne denies nausea, vomiting, or muscle weakness, but she notes fatigue. She is on prescription Vit D.  Assessment/Plan:   1. Insulin resistance Tamara Osborne will continue to work on weight loss, exercise, and decreasing simple carbohydrates to help decrease the risk of diabetes. Tamara Osborne agreed to increase Victoza to 1.2 mg SubQ daily with no refill. Tamara Osborne agreed to follow-up with [place] as directed to closely monitor her progress.  - liraglutide (VICTOZA) 18 MG/3ML SOPN; Inject 1.2 mg into the skin daily.  Dispense: 9 mL; Refill: 0  2. Other fatigue We will follow up with the patient after  her sleep study. Tamara Osborne will continue to follow up as directed.  3. Elevated BP without diagnosis of hypertension Tamara Osborne is working on healthy weight loss and exercise to improve blood pressure control. We will watch for signs of hypotension as she continues her lifestyle modifications. We will follow up on her blood pressure at her next appointment.  4. Vitamin D deficiency Low Vitamin D level contributes to fatigue and are associated with obesity, breast, and colon cancer. We will refill prescription Vitamin D for 1 month. Tamara Osborne will follow-up for routine testing of Vitamin D, at least 2-3 times per year to avoid over-replacement.  - Vitamin D, Ergocalciferol, (DRISDOL) 1.25 MG (50000 UNIT) CAPS capsule; Take 1 capsule (50,000 Units total) by mouth every 7 (seven) days.  Dispense: 4 capsule; Refill: 0  5. Class 3 severe obesity with serious comorbidity and body mass index (BMI) of 40.0 to 44.9 in adult, unspecified obesity type (HCC) Tamara Osborne is currently in the action stage of change. As such, her goal is to continue with weight loss efforts. She has agreed to the Category 1 Plan + 100 calories.   Exercise goals: As is.  Behavioral modification strategies: increasing lean protein intake, increasing vegetables, meal planning and cooking strategies, keeping healthy foods in the home and planning for success.  Tamara Osborne has agreed to follow-up with our clinic in 2 weeks. She was informed of the importance of frequent follow-up visits to maximize her success with intensive lifestyle modifications for her multiple health conditions.   Objective:   Blood pressure (!) 156/98, pulse 84, temperature 98.3 F (  36.8 C), height 5\' 2"  (1.575 m), weight 212 lb (96.2 kg), SpO2 97 %. Body mass index is 38.78 kg/m.  General: Cooperative, alert, well developed, in no acute distress. HEENT: Conjunctivae and lids unremarkable. Cardiovascular: Regular rhythm.  Lungs: Normal work of  breathing. Neurologic: No focal deficits.   Lab Results  Component Value Date   CREATININE 1.35 (H) 09/20/2019   BUN 20 09/20/2019   NA 140 09/20/2019   K 4.5 09/20/2019   CL 105 09/20/2019   CO2 20 09/20/2019   Lab Results  Component Value Date   ALT 29 09/20/2019   AST 20 09/20/2019   ALKPHOS 111 09/20/2019   BILITOT 0.3 09/20/2019   Lab Results  Component Value Date   HGBA1C 5.2 10/30/2019   HGBA1C CANCELED 09/20/2019   Lab Results  Component Value Date   INSULIN 40.3 (H) 09/20/2019   Lab Results  Component Value Date   TSH 1.910 09/20/2019   Lab Results  Component Value Date   CHOL 222 (H) 09/20/2019   HDL 34 (L) 09/20/2019   LDLCALC 158 (H) 09/20/2019   TRIG 162 (H) 09/20/2019   Lab Results  Component Value Date   WBC 6.6 08/07/2019   HGB 11.8 (L) 08/07/2019   HCT 34.8 (L) 08/07/2019   MCV 87.7 08/07/2019   PLT 267 08/07/2019   Lab Results  Component Value Date   IRON 17 (L) 03/27/2019   TIBC 283 03/27/2019   FERRITIN 32 03/27/2019   Attestation Statements:   Reviewed by clinician on day of visit: allergies, medications, problem list, medical history, surgical history, family history, social history, and previous encounter notes.   I, Trixie Dredge, am acting as transcriptionist for Coralie Common, MD.  I have reviewed the above documentation for accuracy and completeness, and I agree with the above. - Jinny Blossom, MD

## 2019-12-06 NOTE — Progress Notes (Signed)
Radiation Oncology         ([phone] ________________________________  Name: Tamara Osborne MRN: [id number]  Date: 12/07/2019  DOB: 04/15/1975  Follow-Up Visit Note  CC: [facility], [facility]    ICD-10-CM   1. Squamous cell carcinoma of cervix (HCC)  C53.9     Diagnosis: Stage II-B squamous cell carcinoma of the cervix  Interval Since Last Radiation: Six months and two days  Radiation Treatment Dates: 03/23/2019 through 06/05/2019   External beam therapy was from 03/23/2019 - 05/04/2019. HDR vaginal brachytherapy was from 05/19/2019 - 06/05/2019.  Site Technique Total Dose (Gy) Dose per Fx (Gy) Completed Fx Beam Energies  Cervix: Cervix HDR-brachy 5.5/5.5 5.5 1/1 Ir-192  Cervix: Cervix HDR-brachy 5.5/5.5 5.5 1/1 Ir-192  Cervix: Cervix_Bst HDR-brachy 5.5/5.5 5.5 1/1 Ir-192  Cervix: Cervix HDR-brachy 5.5/5.5 5.5 1/1 Ir-192  Cervix: Cervix 3D 45/45 1.8 25/25 10X, 15X  Cervix: Cervix HDR-brachy 5.5/5.5 5.5 1/1 Ir-192  Cervix: Cervix_Bst 3D 9/9 1.8 5/5 15X    Narrative:  The patient returns today for routine follow-up. Since her last visit, she underwent a PET scan on 09/06/2019 that showed a large benign cystic mass in the upper pelvis that was unchanged. There was no evidence of residual hypermetabolic cervical tissue or metastatic cervical carcinoma.   She was last seen by Dr. Alvy Bimler on 09/07/2019, during which time she was noted to have had a complete response to therapy. The patient's case was then presented at the [facility] and it was recommended that she proceed with port removal and follow-up with radiation oncology/GYN oncology.  On review of systems, she reports feeling anxious while using the dilator. She also reports elevated blood pressure and sleep apnea. She denies pelvic pain.  She occasionally will notice some vaginal spotting but no significant bleeding.  She has reported stress and  urge urinary incontinence.  We discussed physical therapy consultation to help with this issue and she is agreeable.  She continues to use her vaginal dilator but is unsure if she has the proper size.  We have given her 3 additional dilators to try.  She reports not being sexually active at this time as her boyfriend is afraid he will initiate significant bleeding.  ALLERGIES:  is allergic to penicillins, shellfish allergy, and sulfa antibiotics.  Meds: Current Outpatient Medications  Medication Sig Dispense Refill  . amLODipine (NORVASC) 10 MG tablet Take 1 tablet (10 mg total) by mouth daily. (Patient taking differently: Take 10 mg by mouth every evening. ) 90 tablet 0  . ASHWAGANDHA PO Take by mouth. Blue [place] Gummies    . fluticasone (FLONASE) 50 MCG/ACT nasal spray Place 1 spray into both nostrils daily as needed for allergies or rhinitis.     . Insulin Pen Needle (BD PEN NEEDLE NANO 2ND GEN) 32G X 4 MM MISC 1 Package by Does not apply route 2 (two) times daily. 100 each 0  . liraglutide (VICTOZA) 18 MG/3ML SOPN Inject 1.2 mg into the skin daily. 9 mL 0  . Nutritional Supplements (NUTRITIONAL SUPPLEMENT PO) Take by mouth.    . valsartan (DIOVAN) 160 MG tablet Take 160 mg by mouth daily.    . Vitamin D, Ergocalciferol, (DRISDOL) 1.25 MG (50000 UNIT) CAPS capsule Take 1 capsule (50,000 Units total) by mouth every 7 (seven) days. 4 capsule 0   No current facility-administered medications for this encounter.    Physical Findings: The patient is in no acute distress. Patient is  alert and oriented.  height is 5\' 2"  (1.575 m) and weight is 213 lb 6 oz (96.8 kg). Her tympanic temperature is 97.1 F (36.2 C) (abnormal). Her blood pressure is 151/107 (abnormal) and her pulse is 94. Her respiration is 18 and oxygen saturation is 100%.    Lungs are clear to auscultation bilaterally. Heart has regular rate and rhythm. No palpable cervical, supraclavicular, or axillary adenopathy. Abdomen soft,  non-tender, normal bowel sounds. On pelvic examination the external genitalia were unremarkable. A speculum exam was performed. There is a small area of necrotic tissue at the region of the cervical os (~ 7-8 mm).  Unsure if this is radiation necrosis or possibly tumor.  The proximal vaginal vault is somewhat narrowed and the mucosa bleeds easily with manipulation. On bimanual and rectovaginal examination there were no pelvic masses appreciated.   Lab Findings: Lab Results  Component Value Date   WBC 6.6 08/07/2019   HGB 11.8 (L) 08/07/2019   HCT 34.8 (L) 08/07/2019   MCV 87.7 08/07/2019   PLT 267 08/07/2019    Radiographic Findings: No results found.  Impression: Stage II-B squamous cell carcinoma of the cervix  Patient continues to show clinical improvement.  As above exam shows a small area of necrosis in the region of the cervical os.  This could be radiation necrosis or possibly tumor  Plan: Patient will be referred back to Dr. Berline Lopes in the near future for detailed exam and possible biopsy.  We will make referral to physical therapy for urinary incontinence issues.  Assuming Dr. Charisse March exam is unremarkable and no concern for recurrence,  The patient will see Dr. Berline Lopes again in 3 months and then follow-up with radiation oncology in 6 months.  Total time spent in this encounter was 25 minutes which included reviewing the patient's most recent PET scan, follow-ups, physical examination, and documentation.  ____________________________________   Blair Promise, PhD, MD  This document serves as a record of services personally performed by Gery Pray, MD. It was created on his behalf by Clerance Lav, a trained medical scribe. The creation of this record is based on the scribe's personal observations and the provider's statements to them. This document has been checked and approved by the attending provider.

## 2019-12-07 ENCOUNTER — Encounter: Payer: Self-pay | Admitting: Radiation Oncology

## 2019-12-07 ENCOUNTER — Ambulatory Visit
Admission: RE | Admit: 2019-12-07 | Discharge: 2019-12-07 | Disposition: A | Discharge status: Home / Self Care | Payer: Medicaid Other | Source: Ambulatory Visit | Attending: Radiation Oncology | Admitting: Radiation Oncology

## 2019-12-07 ENCOUNTER — Other Ambulatory Visit: Payer: Self-pay

## 2019-12-07 VITALS — BP 151/107 | HR 94 | Temp 97.1°F | Resp 18 | Ht 62.0 in | Wt 213.4 lb

## 2019-12-07 DIAGNOSIS — N3946 Mixed incontinence: Secondary | ICD-10-CM | POA: Insufficient documentation

## 2019-12-07 DIAGNOSIS — G4730 Sleep apnea, unspecified: Secondary | ICD-10-CM | POA: Diagnosis not present

## 2019-12-07 DIAGNOSIS — Z79899 Other long term (current) drug therapy: Secondary | ICD-10-CM | POA: Diagnosis not present

## 2019-12-07 DIAGNOSIS — R030 Elevated blood-pressure reading, without diagnosis of hypertension: Secondary | ICD-10-CM | POA: Diagnosis not present

## 2019-12-07 DIAGNOSIS — Z923 Personal history of irradiation: Secondary | ICD-10-CM | POA: Diagnosis not present

## 2019-12-07 DIAGNOSIS — C539 Malignant neoplasm of cervix uteri, unspecified: Secondary | ICD-10-CM | POA: Insufficient documentation

## 2019-12-07 NOTE — Progress Notes (Signed)
Patient in for follow up. She is having trouble with her blood pressure also sleep apnea. She is having a lot of anxiety using the dialator. Has a boyfriend but they have not had sex since the diagnosis.She denies any pain.

## 2019-12-07 NOTE — Patient Instructions (Signed)
Coronavirus (COVID-19) Are you at risk?  Are you at risk for the Coronavirus (COVID-19)?  To be considered HIGH RISK for Coronavirus (COVID-19), you have to meet the following criteria:  . Traveled to [place], [place], [place], [place] or [place]; or in the [place] to [place], [place], [place], or [place]; and have fever, cough, and shortness of breath within the last 2 weeks of travel OR . Been in close contact with a person diagnosed with COVID-19 within the last 2 weeks and have fever, cough, and shortness of breath . IF YOU DO NOT MEET THESE CRITERIA, YOU ARE CONSIDERED LOW RISK FOR COVID-19.  What to do if you are HIGH RISK for COVID-19?  . If you are having a medical emergency, call 911. . Seek medical care right away. Before you go to a doctor's office, urgent care or emergency department, call ahead and tell them about your recent travel, contact with someone diagnosed with COVID-19, and your symptoms. You should receive instructions from your physician's office regarding next steps of care.  . When you arrive at healthcare provider, tell the healthcare staff immediately you have returned from visiting [place], [place], [place], [place] or [place]; or traveled in the [place] to [place], [place], [place], or [place]; in the last two weeks or you have been in close contact with a person diagnosed with COVID-19 in the last 2 weeks.   . Tell the health care staff about your symptoms: fever, cough and shortness of breath. . After you have been seen by a medical provider, you will be either: o Tested for (COVID-19) and discharged home on quarantine except to seek medical care if symptoms worsen, and asked to  - Stay home and avoid contact with others until you get your results (4-5 days)  - Avoid travel on public transportation if possible (such as bus, train, or airplane) or o Sent to the Emergency Department by EMS for evaluation, COVID-19 testing, and possible  admission depending on your condition and test results.  What to do if you are LOW RISK for COVID-19?  Reduce your risk of any infection by using the same precautions used for avoiding the common cold or flu:  . Wash your hands often with soap and warm water for at least 20 seconds.  If soap and water are not readily available, use an alcohol-based hand sanitizer with at least 60% alcohol.  . If coughing or sneezing, cover your mouth and nose by coughing or sneezing into the elbow areas of your shirt or coat, into a tissue or into your sleeve (not your hands). . Avoid shaking hands with others and consider head nods or verbal greetings only. . Avoid touching your eyes, nose, or mouth with unwashed hands.  . Avoid close contact with people who are sick. . Avoid places or events with large numbers of people in one location, like concerts or sporting events. . Carefully consider travel plans you have or are making. . If you are planning any travel outside or inside the [place], visit the CDC's Travelers' Health webpage for the latest health notices. . If you have some symptoms but not all symptoms, continue to monitor at home and seek medical attention if your symptoms worsen. . If you are having a medical emergency, call 911.   ADDITIONAL HEALTHCARE OPTIONS FOR PATIENTS  [facility] / e-Visit: [email or web address]         [facility]: [phone]  [facility]: [phone]                   [facility]: [phone]   

## 2019-12-11 ENCOUNTER — Ambulatory Visit (INDEPENDENT_AMBULATORY_CARE_PROVIDER_SITE_OTHER): Payer: Medicaid Other | Admitting: Neurology

## 2019-12-11 DIAGNOSIS — E669 Obesity, unspecified: Secondary | ICD-10-CM

## 2019-12-11 DIAGNOSIS — G4719 Other hypersomnia: Secondary | ICD-10-CM

## 2019-12-11 DIAGNOSIS — G4733 Obstructive sleep apnea (adult) (pediatric): Secondary | ICD-10-CM

## 2019-12-11 DIAGNOSIS — R0683 Snoring: Secondary | ICD-10-CM

## 2019-12-11 DIAGNOSIS — R351 Nocturia: Secondary | ICD-10-CM

## 2019-12-12 ENCOUNTER — Telehealth: Payer: Self-pay | Admitting: Oncology

## 2019-12-12 NOTE — Telephone Encounter (Signed)
Tamara Osborne with appointment to see Dr. Berline Lopes on 12/14/19 at 1:15.  She verbalized agreement.

## 2019-12-14 ENCOUNTER — Other Ambulatory Visit: Payer: Self-pay

## 2019-12-14 ENCOUNTER — Encounter: Payer: Self-pay | Admitting: Gynecologic Oncology

## 2019-12-14 ENCOUNTER — Inpatient Hospital Stay: Payer: Medicaid Other | Attending: Hematology and Oncology | Admitting: Gynecologic Oncology

## 2019-12-14 VITALS — BP 168/92 | HR 93 | Temp 99.0°F | Resp 20 | Wt 212.0 lb

## 2019-12-14 DIAGNOSIS — Z6838 Body mass index (BMI) 38.0-38.9, adult: Secondary | ICD-10-CM | POA: Diagnosis not present

## 2019-12-14 DIAGNOSIS — C539 Malignant neoplasm of cervix uteri, unspecified: Secondary | ICD-10-CM | POA: Insufficient documentation

## 2019-12-14 DIAGNOSIS — E119 Type 2 diabetes mellitus without complications: Secondary | ICD-10-CM | POA: Diagnosis not present

## 2019-12-14 DIAGNOSIS — Z79899 Other long term (current) drug therapy: Secondary | ICD-10-CM | POA: Diagnosis not present

## 2019-12-14 DIAGNOSIS — F32A Depression, unspecified: Secondary | ICD-10-CM | POA: Insufficient documentation

## 2019-12-14 DIAGNOSIS — Z9221 Personal history of antineoplastic chemotherapy: Secondary | ICD-10-CM | POA: Diagnosis not present

## 2019-12-14 DIAGNOSIS — F419 Anxiety disorder, unspecified: Secondary | ICD-10-CM | POA: Insufficient documentation

## 2019-12-14 DIAGNOSIS — I10 Essential (primary) hypertension: Secondary | ICD-10-CM | POA: Diagnosis not present

## 2019-12-14 DIAGNOSIS — E669 Obesity, unspecified: Secondary | ICD-10-CM | POA: Insufficient documentation

## 2019-12-14 DIAGNOSIS — Z794 Long term (current) use of insulin: Secondary | ICD-10-CM | POA: Insufficient documentation

## 2019-12-14 DIAGNOSIS — Z923 Personal history of irradiation: Secondary | ICD-10-CM | POA: Diagnosis not present

## 2019-12-14 NOTE — Progress Notes (Signed)
Gynecologic Oncology Return Clinic Visit  12/14/19  Reason for Visit: follow-up for recently treated cervical cancer, abnormality seen on exam  Treatment History: Oncology History  Squamous cell carcinoma of cervix ([place])  03/21/2019 Pathology Results   The biopsy is superficial and therefore depth of invasion cannot be  determined.  There is at least carcinoma in-situ.    03/23/2019 Initial Diagnosis   Squamous cell carcinoma of cervix ([place])   03/23/2019 Pathology Results   CERVIX, 11 O CLOCK, BIOPSY:  -  Squamous cell carcinoma, invasive  -  See comment   03/23/2019 - 06/05/2019 Radiation Therapy   External beam therapy was from 03/23/2019 - 05/04/2019. HDR vaginal brachytherapy was from 05/19/2019 - 06/05/2019.   Site Technique Total Dose (Gy) Dose per Fx (Gy) Completed Fx Beam Energies  Cervix: Cervix HDR-brachy 5.5/5.5 5.5 1/1 Ir-192  Cervix: Cervix HDR-brachy 5.5/5.5 5.5 1/1 Ir-192  Cervix: Cervix_Bst HDR-brachy 5.5/5.5 5.5 1/1 Ir-192  Cervix: Cervix HDR-brachy 5.5/5.5 5.5 1/1 Ir-192  Cervix: Cervix 3D 45/45 1.8 25/25 10X, 15X  Cervix: Cervix HDR-brachy 5.5/5.5 5.5 1/1 Ir-192  Cervix: Cervix_Bst 3D 9/9 1.8 5/5 15X      03/28/2019 PET scan   1. Hypermetabolic cervical mass measures roughly 7.6 by 6.9 by 4.9 cm, compatible with malignancy. No adenopathy or distant metastatic spread identified. 2. Extending cephalad and anterior to the uterine fundus, there is a 450 cubic cm simple fluid density structure without hypermetabolic activity. This may represent a right adnexal cyst (favored), peritoneal inclusion cyst, or (if the patient has a remote history of pancreatitis) a remote pseudocyst. 3. Faint ground density nodularity in both lower lobes which could be from atypical infection or extrinsic allergic alveolitis. Given nodular appearance of some of this ground-glass density, I would recommend follow up chest CT in 3 months time in order to ensure clearance. 4. Moderate  cardiomegaly, cause uncertain. The patient also has advanced for age/gender atherosclerosis. Aortic Atherosclerosis (ICD10-I70.0).     04/04/2019 Procedure   Successful placement of a right internal jugular approach power injectable Port-A-Cath. The catheter is ready for immediate use   04/07/2019 - 05/12/2019 Chemotherapy   The patient had weekly cisplatin for chemotherapy treatment.     09/06/2019 PET scan   1. No evidence residual hypermetabolic cervical tissue. 2. No evidence of metastatic cervical carcinoma. 3. Large benign cystic mass unchanged in the upper pelvis.         Interval History: Has been doing very well since finishing treatment.  Has gained almost all the weight back that she lost during therapy, but has lost 3 pounds in the last couple of weeks.  She has been followed at the wellness center and was recently diagnosed with insulin resistance and borderline diabetes.  She has started Victoza.  She denies any vaginal bleeding and has intermittent mild spotting only with the use of her vaginal dilator which she is trying to do a couple of times a week.  She has some anxiety about this given her worry about bleeding or harming something.  She denies any sexual activity since her original diagnosis.  She is having some urinary incontinence and was referred to physical therapy by radiation oncology.  She reports a good appetite without nausea or emesis.  She denies any change to her bowel function.  She saw Dr.Kinard last week for follow-up and was noted to have what appeared to be necrosis at the cervical os.  She presents today for an exam and possible biopsy.  Past Medical/Surgical  History: Past Medical History:  Diagnosis Date  . Anemia   . Anxiety   . Bartholin cyst   . Cervical cancer ([place])   . Constipation   . Depression   . Deviated septum   . Diabetes mellitus without complication ([place])   . Difficult intravenous access    used port for 05-09-2019 surgery  . Fibroids    . History of blood transfusion    2 units given 04-26-2019, has had total of 8 units since jan 2021  . History of blood transfusion 1 unit given 05-30-19   iv fluids also given  . History of kidney problems   . History of recent blood transfusion 05/16/2019   2 units given  per dr Pollyann Savoy at cancer center  . Hypertension   . Neuropathy    both thumbs  . Obesity   . Palpitations   . PONV (postoperative nausea and vomiting)   . Prediabetes   . Preeclampsia 2006  . Sleep apnea    no cpap used insurance would not cover, osa severe per pt  . SOB (shortness of breath)   . Swallowing difficulty     Past Surgical History:  Procedure Laterality Date  . CESAREAN SECTION WITH BILATERAL TUBAL LIGATION  11/10/2004      . DILATION AND CURETTAGE OF UTERUS  1997  . IR IMAGING GUIDED PORT INSERTION  04/04/2019  . OPERATIVE ULTRASOUND N/A 05/09/2019   Procedure: OPERATIVE ULTRASOUND;  Surgeon: Gery Pray, MD;  Location: [facility];  Service: Urology;  Laterality: N/A;  . OPERATIVE ULTRASOUND N/A 05/15/2019   Procedure: OPERATIVE ULTRASOUND;  Surgeon: Gery Pray, MD;  Location: [facility];  Service: Urology;  Laterality: N/A;  . OPERATIVE ULTRASOUND N/A 05/22/2019   Procedure: OPERATIVE ULTRASOUND;  Surgeon: Gery Pray, MD;  Location: [facility];  Service: Urology;  Laterality: N/A;  . OPERATIVE ULTRASOUND N/A 05/29/2019   Procedure: OPERATIVE ULTRASOUND;  Surgeon: Gery Pray, MD;  Location: [facility];  Service: Urology;  Laterality: N/A;  . OPERATIVE ULTRASOUND N/A 06/05/2019   Procedure: OPERATIVE ULTRASOUND;  Surgeon: Gery Pray, MD;  Location: [facility];  Service: Urology;  Laterality: N/A;  . TANDEM RING INSERTION N/A 05/09/2019   Procedure: TANDEM RING INSERTION;  Surgeon: Gery Pray, MD;  Location: [facility];  Service: Urology;  Laterality: N/A;  . TANDEM RING INSERTION N/A 05/15/2019    Procedure: TANDEM RING INSERTION;  Surgeon: Gery Pray, MD;  Location[facility];  Service: Urology;  Laterality: N/A;  . TANDEM RING INSERTION N/A 05/22/2019   Procedure: TANDEM RING INSERTION;  Surgeon: Gery Pray, MD;  Location: [facility];  Service: Urology;  Laterality: N/A;  . TANDEM RING INSERTION N/A 05/29/2019   Procedure: TANDEM RING INSERTION;  Surgeon: Gery Pray, MD;  Location: [facility];  Service: Urology;  Laterality: N/A;  . TANDEM RING INSERTION N/A 06/05/2019   Procedure: TANDEM RING INSERTION;  Surgeon: Gery Pray, MD;  Location: [facility];  Service: Urology;  Laterality: N/A;    Family History  Problem Relation Age of Onset  . Brain cancer Mother        lung cancer to brain  . Hypertension Mother   . Cancer Mother   . Depression Mother   . Anxiety disorder Mother   . Diabetes Father   . Kidney disease Father   . Cancer Father        kidney  cancer  . Heart failure Sister     Social History   Socioeconomic History  . Marital status: Single    Spouse name: Not on file  . Number of children: 4  . Years of education: Not on file  . Highest education level: Not on file  Occupational History  . Not on file  Tobacco Use  . Smoking status: Never Smoker  . Smokeless tobacco: Never Used  Vaping Use  . Vaping Use: Never used  Substance and Sexual Activity  . Alcohol use: No  . Drug use: No  . Sexual activity: Not Currently    Birth control/protection: Surgical  Other Topics Concern  . Not on file  Social History Narrative   Lives with her boyfriend   Social Determinants of Health   Financial Resource Strain:   . Difficulty of Paying Living Expenses: Not on file  Food Insecurity:   . Worried About Charity fundraiser in the Last Year: Not on file  . Ran Out of Food in the Last Year: Not on file  Transportation Needs:   . Lack of Transportation (Medical): Not on file  . Lack  of Transportation (Non-Medical): Not on file  Physical Activity:   . Days of Exercise per Week: Not on file  . Minutes of Exercise per Session: Not on file  Stress:   . Feeling of Stress : Not on file  Social Connections:   . Frequency of Communication with Friends and Family: Not on file  . Frequency of Social Gatherings with Friends and Family: Not on file  . Attends Religious Services: Not on file  . Active Member of Clubs or Organizations: Not on file  . Attends Archivist Meetings: Not on file  . Marital Status: Not on file    Current Medications:  Current Outpatient Medications:  .  amLODipine (NORVASC) 10 MG tablet, Take 1 tablet (10 mg total) by mouth daily. (Patient taking differently: Take 10 mg by mouth every evening. ), Disp: 90 tablet, Rfl: 0 .  ASHWAGANDHA PO, Take by mouth. Blue [place]oli Gummies, Disp: , Rfl:  .  Insulin Pen Needle (BD PEN NEEDLE NANO 2ND GEN) 32G X 4 MM MISC, 1 Package by Does not apply route 2 (two) times daily., Disp: 100 each, Rfl: 0 .  liraglutide (VICTOZA) 18 MG/3ML SOPN, Inject 1.2 mg into the skin daily., Disp: 9 mL, Rfl: 0 .  Nutritional Supplements (NUTRITIONAL SUPPLEMENT PO), Take by mouth., Disp: , Rfl:  .  valsartan (DIOVAN) 160 MG tablet, Take 160 mg by mouth daily., Disp: , Rfl:  .  fluticasone (FLONASE) 50 MCG/ACT nasal spray, Place 1 spray into both nostrils daily as needed for allergies or rhinitis.  (Patient not taking: Reported on 12/14/2019), Disp: , Rfl:  .  Vitamin D, Ergocalciferol, (DRISDOL) 1.25 MG (50000 UNIT) CAPS capsule, Take 1 capsule (50,000 Units total) by mouth every 7 (seven) days., Disp: 4 capsule, Rfl: 0  Review of Systems: Denies appetite changes, fevers, chills, fatigue, unexplained weight changes. Denies hearing loss, neck lumps or masses, mouth sores, ringing in ears or voice changes. Denies cough or wheezing.  Denies shortness of breath. Denies chest pain or palpitations. Denies leg swelling. Denies  abdominal distention, pain, blood in stools, constipation, diarrhea, nausea, vomiting, or early satiety. Denies pain with intercourse, dysuria, frequency, hematuria or incontinence. Denies hot flashes, pelvic pain, vaginal bleeding or vaginal discharge.   Denies joint pain, back pain or muscle pain/cramps. Denies itching, rash, or wounds.  Denies dizziness, headaches, numbness or seizures. Denies swollen lymph nodes or glands, denies easy bruising or bleeding. Denies anxiety, depression, confusion, or decreased concentration.  Physical Exam: BP (!) 168/92 (BP Location: Right Arm, Patient Position: Sitting) Comment: c/o anxiety; MD notified  Pulse 93   Temp 99 F (37.2 C) (Tympanic)   Resp 20   Wt 212 lb (96.2 kg)   SpO2 100%   BMI 38.78 kg/m  General: Alert, oriented, no acute distress. HEENT: Normocephalic, atraumatic, sclera anicteric. Chest: Unlabored breathing on room air.  Port site clean. Abdomen: Obese, soft, nontender.  Normoactive bowel sounds.  Fullness appreciated at the level of the umbilicus and superiorly consistent with known cystic appearing stable mass.  No hepatosplenomegaly appreciated.   Extremities: Grossly normal range of motion.  Warm, well perfused.  No edema bilaterally. Skin: No rashes or lesions noted. Lymphatics: No cervical, supraclavicular, or inguinal adenopathy. GU: Normal appearing external genitalia without erythema, excoriation, or lesions.  Speculum exam reveals moderately atrophic vaginal mucosa.  There is been loss of the vaginal fornices.  The cervix itself is atrophic appearing with some pale and perhaps necrotic appearing tissue within the central portion.  On the anterior lip of the cervix just superior to the os, there is some tissue that appears necrotic with an atypical blood vessel.  Bimanual exam reveals no firmness or nodularity to the cervix.  Rectovaginal exam confirms no nodularity, no parametrial disease appreciated.  Cervical biopsy  procedure: With the patient's verbal consent, after the procedure and risks have been discussed, speculum was placed in the vagina and the cervix was visualized.  Betadine was used to clean the cervix x3.  Tischler forceps were then used to perform a biopsy of the anterior lip of the cervix.  No bleeding was noted after biopsy.  All instruments were removed.  Overall the patient tolerated the procedure well.  Laboratory & Radiologic Studies: None new  Assessment & Plan: Tamara Osborne is a 44 y.o. woman with Stage IIB SCC who presents for exam after recent exam in radiation oncology was concerning for treatment-related necrosis versus persistent/recurrent disease.  The patient is doing quite well since completing treatment.  She has significant anxiety related to her original diagnosis and several biopsies with significant blood loss.  Overall her exam is reassuring today.  I took a biopsy of an area that was mildly concerning only because of the presence of an atypical blood vessel.  Otherwise, findings appear consistent with treatment effect.  Patient knows that I will reach out to her on my chart with the biopsy results and less we need to talk by phone.  Patient is motivated to lose weight.  She recently started being seen at the wellness center and was placed on a meal plan and started on Victoza.  I praised her for the steps and encouraged her weight loss journey.  In terms of surveillance, we discussed surveillance visits every 3 months alternating between myself and radiation oncology.  She is scheduled to see radiation oncology in April.  I have asked her to call back in December to schedule a 29-month follow-up visit with me in January.  We reviewed signs and symptoms that would be concerning for disease recurrence and the patient knows to call if she develops any of these sooner.  Patient has not yet received her Covid vaccine.  She has decided that she would like to proceed with this  but prefers to do it with her partner.  I offered that  she could call the clinic to get visits for them scheduled.  20 minutes of total time was spent for this patient encounter, including preparation, face-to-face counseling with the patient and coordination of care, and documentation of the encounter.  Jeral Pinch, MD  Division of Gynecologic Oncology  Department of Obstetrics and Gynecology  [facility]

## 2019-12-14 NOTE — Patient Instructions (Signed)
Overall, things on your exam look good!  I will contact you on MyChart with your biopsy results in the next few days.  As long as the biopsy does not show something concerning, I will plan to see you for follow-up in 3 months.  Please call my office in December to get a follow-up scheduled for mid January.  If you develop any concerning symptoms such as vaginal bleeding, discharge, pelvic pain, weight loss that is unintentional, or changes to your bowel function, please call me to schedule a visit sooner.

## 2019-12-14 NOTE — Addendum Note (Signed)
Addended by: Delorise Jackson on: 12/14/2019 02:48 PM   Modules accepted: Orders

## 2019-12-18 LAB — SURGICAL PATHOLOGY

## 2019-12-19 ENCOUNTER — Ambulatory Visit: Payer: Medicaid Other

## 2019-12-20 ENCOUNTER — Telehealth: Payer: Self-pay

## 2019-12-20 NOTE — Progress Notes (Signed)
Patient referred by Dr. Adair Patter for re-eval of her OSA, seen by me on 11/21/19, HST on 12/11/19.    Please call and notify the patient that the recent home sleep test showed obstructive sleep apnea in the severe range. I recommend, she start treatment in the form of autoPAP, which means, that we don't have to bring her in for a sleep study with CPAP, but will let her start using an autoPAP machine at home, through a [facility] (of her choice, or as per insurance requirement). The DME representative will educate her on how to use the machine, how to put the mask on, etc. I have placed an order in the chart. Please send referral, talk to patient, send report to referring MD. We will need a FU in sleep clinic for 10 weeks post-PAP set up, please arrange that with me or one of our NPs. Thanks,   Star Age, MD, PhD [facility])

## 2019-12-20 NOTE — Telephone Encounter (Signed)
-----   Message from Star Age, MD sent at 12/20/2019 12:27 PM EDT ----- Patient referred by Dr. Adair Patter for re-eval of her OSA, seen by me on 11/21/19, HST on 12/11/19.    Please call and notify the patient that the recent home sleep test showed obstructive sleep apnea in the severe range. I recommend, she start treatment in the form of autoPAP, which means, that we don't have to bring her in for a sleep study with CPAP, but will let her start using an autoPAP machine at home, through a [place] (of her choice, or as per insurance requirement). The DME representative will educate her on how to use the machine, how to put the mask on, etc. I have placed an order in the chart. Please send referral, talk to patient, send report to referring MD. We will need a FU in sleep clinic for 10 weeks post-PAP set up, please arrange that with me or one of our NPs. Thanks,   Star Age, MD, PhD [facility])

## 2019-12-20 NOTE — Addendum Note (Signed)
Addended by: Star Age on: 12/20/2019 12:27 PM   Modules accepted: Orders

## 2019-12-20 NOTE — Telephone Encounter (Signed)
I called pt. I advised pt that Dr. Rexene Alberts reviewed their sleep study results and found that pt severe OSA was present. Dr. Rexene Alberts recommends that pt start autopap for treatment. I reviewed PAP compliance expectations with the pt. Pt is agreeable to starting an auto-PAP. I advised pt that an order will be sent to a DME, [employer], and [employer] will call the pt within about one week after they file with the pt's insurance. [employer] will show the pt how to use the machine, fit for masks, and troubleshoot the auto-PAP if needed. A follow up appt was made for insurance purposes with Dr. Rexene Alberts on 03/12/2020 at 100 pm. Pt verbalized understanding to arrive 15 minutes early and bring their auto-PAP. A letter with all of this information in it will be mailed to the pt as a reminder. I verified with the pt that the address we have on file is correct. Pt verbalized understanding of results. Pt had no questions at this time but was encouraged to call back if questions arise. I have sent the order to [employer] and have received confirmation that they have received the order.

## 2019-12-20 NOTE — Procedures (Signed)
Sleep Study Report   Patient Information     First Name: Tamara Last Name: Osborne ID: [id number]  Birth Date: [birth year] Age: 44 Gender: Female  Referring Provider: Dr. Adair Patter BMI: 39.8 (W=216 lb, H=5' 2'')  Neck Circ.:  62 '' Epworth:  14/24   Sleep Study Information    Study Date: 12/11/19 S/H/A Version: [id number] / 4.2.1023 / 70  History:    44 year old woman with a history of palpitations, hypertension, neuropathy, diabetes, history of cervical cancer, anxiety, anemia, and obesity, who was diagnosed with obstructive sleep apnea several years ago. Her insurance would not cover CPAP at the time. She has had weight fluctuations. She is trying to lose weight. Summary & Diagnosis:    OSA Recommendations:     This home sleep test demonstrates severe obstructive sleep apnea with a total AHI of 37.8/hour and O2 nadir of 86%. Treatment with positive airway pressure is recommended. The patient will be advised to proceed with an autoPAP titration/trial at home for now. Please note that untreated obstructive sleep apnea may carry additional perioperative morbidity. Patients with significant obstructive sleep apnea should receive perioperative PAP therapy and the surgeons and particularly the anesthesiologist should be informed of the diagnosis and the severity of the sleep disordered breathing. The patient should be cautioned not to drive, work at heights, or operate dangerous or heavy equipment when tired or sleepy. Review and reiteration of good sleep hygiene measures should be pursued with any patient. Other causes of the patient's symptoms, including circadian rhythm disturbances, an underlying mood disorder, medication effect and/or an underlying medical problem cannot be ruled out based on this test. Clinical correlation is recommended. The patient and his referring provider will be notified of the test results. The patient will be seen in follow up in sleep clinic at [facility].  I certify that I have  reviewed the raw data recording prior to the issuance of this report in accordance with the standards of the American Academy of Sleep Medicine (AASM).  Star Age, MD, PhD [facility]) Diplomat, ABPN (Neurology and Sleep)           Sleep Summary  Oxygen Saturation Statistics   Start Study Time: End Study Time: Total Recording Time:  9:25:26 PM 7:37:18 AM   10 h, 11 min  Total Sleep Time % REM of Sleep Time:  8 h, 28 min  30.2    Mean: 94 Minimum: 86 Maximum: 99  Mean of Desaturations Nadirs (%):   91  Oxygen Desaturation. %:  4-9 10-20 >20 Total  Events Number Total   143  6 96.0 4.0  0 0.0  149 100.0  Oxygen Saturation: <90 <=88 <85 <80 <70  Duration (minutes): Sleep % 1.7 0.3 0.8 0.0 0.2 0.0 0.0 0.0 0.0 0.0     Respiratory Indices      Total Events REM NREM All Night  pRDI: pAHI 3%: ODI 4%: pAHIc 3%: % CSR: pAHI 4%:  245  225 149 59 0.0 190 11.5 8.4 0.0 1.0 46.9 43.5 29.8 11.6 41.2 37.8 25.0 9.9 31.9       Pulse Rate Statistics during Sleep (BPM)      Mean: 71 Minimum: 52 Maximum: 105    Indices are calculated using technically valid sleep time of 5 h, 57 min.  pAHI=37.8                                                                      Mild              Moderate                    Severe                                                 5              15                    30    Body Position Statistics  Position Supine Prone Right Left Non-Supine  Sleep (min) 112.0 46.0 18.0 332.9 396.9  Sleep % 22.0 9.0 3.5 65.4 78.0  pRDI 79.3 126.2 68.0 15.6 32.2  pAHI 3% 79.3 126.2 56.0 11.4 28.0  ODI 4% 60.8 91.3 28.0 4.5 16.6               Left   Prone  Right  Supine    Snoring Statistics Snoring Level (dB) >40 >50 >60 >70 >80 >Threshold (45)  Sleep (min) 277.2 41.3 2.7 0.0 0.0 102.8  Sleep % 54.5 8.1 0.5 0.0 0.0 20.2    Mean: 43  dB Sleep Stages Chart                         Wake  Sleep      Wake  16.83  %    Sleep  83.17  %   Total:  100.00  %                                                       REM  Light  Deep      REM  30.17  %    Light  56.95  %    Deep  12.88  %   Total:  100.00  %                                 Sleep/Wake States  Sleep Stages  Sleep Latency (min):  REM Latency (min):  Number of Wakes:   19   75   18

## 2019-12-22 ENCOUNTER — Encounter: Payer: Self-pay | Admitting: Physical Therapy

## 2019-12-22 ENCOUNTER — Ambulatory Visit: Payer: Medicaid Other | Attending: Radiation Oncology | Admitting: Physical Therapy

## 2019-12-22 ENCOUNTER — Other Ambulatory Visit: Payer: Self-pay

## 2019-12-22 DIAGNOSIS — N39498 Other specified urinary incontinence: Secondary | ICD-10-CM | POA: Insufficient documentation

## 2019-12-22 DIAGNOSIS — R252 Cramp and spasm: Secondary | ICD-10-CM | POA: Diagnosis present

## 2019-12-22 DIAGNOSIS — R2689 Other abnormalities of gait and mobility: Secondary | ICD-10-CM

## 2019-12-22 DIAGNOSIS — M6281 Muscle weakness (generalized): Secondary | ICD-10-CM | POA: Insufficient documentation

## 2019-12-22 NOTE — Therapy (Signed)
[facility] [address], [place], [place], [address] Phone: [phone]   Fax:  [phone]  Physical Therapy Evaluation  Patient Details  Name: Tamara Osborne MRN: [id number] Date of Birth: [birth year] Referring Provider (PT): Dr. Gery Pray   Encounter Date: 12/22/2019   PT End of Session - 12/22/19 0937    Visit Number 1    Date for PT Re-Evaluation 03/01/20    Authorization Type [employer]    Authorization - Visit Number 1    Authorization - Number of Visits 27    PT Start Time 0845    PT Stop Time 0930    PT Time Calculation (min) 45 min    Activity Tolerance Patient tolerated treatment well    Behavior During Therapy [facility] for tasks assessed/performed           Past Medical History:  Diagnosis Date  . Anemia   . Anxiety   . Bartholin cyst   . Cervical cancer ([place])   . Constipation   . Depression   . Deviated septum   . Diabetes mellitus without complication ([place])   . Difficult intravenous access    used port for 05-09-2019 surgery  . Fibroids   . History of blood transfusion    2 units given 04-26-2019, has had total of 8 units since jan 2021  . History of blood transfusion 1 unit given 05-30-19   iv fluids also given  . History of kidney problems   . History of recent blood transfusion 05/16/2019   2 units given  per dr Pollyann Savoy at cancer center  . Hypertension   . Neuropathy    both thumbs  . Obesity   . Palpitations   . PONV (postoperative nausea and vomiting)   . Prediabetes   . Preeclampsia 2006  . Sleep apnea    no cpap used insurance would not cover, osa severe per pt  . SOB (shortness of breath)   . Swallowing difficulty     Past Surgical History:  Procedure Laterality Date  . CESAREAN SECTION WITH BILATERAL TUBAL LIGATION  11/10/2004      . DILATION AND CURETTAGE OF UTERUS  1997  . IR IMAGING GUIDED PORT INSERTION  04/04/2019  . OPERATIVE ULTRASOUND N/A 05/09/2019    Procedure: OPERATIVE ULTRASOUND;  Surgeon: Gery Pray, MD;  Location: [facility];  Service: Urology;  Laterality: N/A;  . OPERATIVE ULTRASOUND N/A 05/15/2019   Procedure: OPERATIVE ULTRASOUND;  Surgeon: Gery Pray, MD;  Location: [facility];  Service: Urology;  Laterality: N/A;  . OPERATIVE ULTRASOUND N/A 05/22/2019   Procedure: OPERATIVE ULTRASOUND;  Surgeon: Gery Pray, MD;  Location: [facility];  Service: Urology;  Laterality: N/A;  . OPERATIVE ULTRASOUND N/A 05/29/2019   Procedure: OPERATIVE ULTRASOUND;  Surgeon: Gery Pray, MD;  Location: [facility];  Service: Urology;  Laterality: N/A;  . OPERATIVE ULTRASOUND N/A 06/05/2019   Procedure: OPERATIVE ULTRASOUND;  Surgeon: Gery Pray, MD;  Location: [facility];  Service: Urology;  Laterality: N/A;  . TANDEM RING INSERTION N/A 05/09/2019   Procedure: TANDEM RING INSERTION;  Surgeon: Gery Pray, MD;  Location: [facility];  Service: Urology;  Laterality: N/A;  . TANDEM RING INSERTION N/A 05/15/2019   Procedure: TANDEM RING INSERTION;  Surgeon: Gery Pray, MD;  Location: [facility];  Service: Urology;  Laterality: N/A;  . TANDEM RING INSERTION N/A 05/22/2019   Procedure: TANDEM RING INSERTION;  Surgeon: Gery Pray, MD;  Location: [facility];  Service: Urology;  Laterality: N/A;  . TANDEM RING INSERTION N/A 05/29/2019   Procedure: TANDEM RING INSERTION;  Surgeon: Gery Pray, MD;  Location: [facility];  Service: Urology;  Laterality: N/A;  . TANDEM RING INSERTION N/A 06/05/2019   Procedure: TANDEM RING INSERTION;  Surgeon: Gery Pray, MD;  Location: [facility];  Service: Urology;  Laterality: N/A;    There were no vitals filed for this visit.    Subjective Assessment - 12/22/19 0851    Subjective Urinary leakage with walking, sneezing, laughing. No control over bladder.  Wears 2 pads per day that are thick. Patient is using dilators and has pain. Patient on the 4th size of the dilator from the 5th one. Patient has hip tightness.    Pertinent History Cervical cancer; radiation 03/23/2019-06/05/2019    Patient Stated Goals improve urinary incontinence, help with use of dilators    Currently in Pain? Yes    Pain Score 5     Pain Location Vagina    Pain Orientation Lower    Pain Descriptors / Indicators Sore    Pain Type Acute pain    Pain Onset More than a month ago    Pain Frequency Constant    Aggravating Factors  after using the dilator    Pain Relieving Factors not using dilator              [facility] PT Assessment - 12/22/19 0001      Assessment   Medical Diagnosis C53.9 Squamous cell carcinoma of cervix    Referring Provider (PT) Dr. Gery Pray    Onset Date/Surgical Date 03/18/19    Prior Therapy none      Precautions   Precautions Other (comment)    Precaution Comments cervical cancer with radiation      Restrictions   Weight Bearing Restrictions No      Balance Screen   Has the patient fallen in the past 6 months No    Has the patient had a decrease in activity level because of a fear of falling?  No    Is the patient reluctant to leave their home because of a fear of falling?  No      Home Ecologist residence      Prior Function   Level of Independence Independent    Leisure unable to due to urinary urge and leakage      Cognition   Overall Cognitive Status Within Functional Limits for tasks assessed      Posture/Postural Control   Posture/Postural Control Postural limitations    Posture Comments protruding abdomen, short waisted      ROM / Strength   AROM / PROM / Strength AROM;PROM;Strength      AROM   Overall AROM Comments lumbar ROM decreased by 50%      PROM   Right Hip Internal Rotation  10    Left Hip Internal Rotation  10      Strength   Right Hip Flexion 4-/5    Right Hip  Extension 4/5    Right Hip External Rotation  3+/5    Right Hip ABduction 4/5    Left Hip Flexion 4-/5    Left Hip Extension 4/5    Left Hip External Rotation 3+/5    Left Hip ABduction 4/5      Palpation   Palpation comment tenderness located in lower abdomen  Transfers   Comments difficulty with moving in bed due to weakness and increased weight      Standardized Balance Assessment   Standardized Balance Assessment Five Times Sit to Stand    Five times sit to stand comments  22 sec                      Objective measurements completed on examination: See above findings.     Pelvic Floor Special Questions - 12/22/19 0001    Prior Pregnancies Yes    Number of C-Sections 1    Currently Sexually Active Yes   but not since treatment, very scared to    Urinary Leakage Yes    How often --   empties bladder   Pad use 2 large    Activities that cause leaking With strong urge;Coughing;Sneezing;Laughing;Walking;Other   walk 300 feet then has urge to urinate   Other activities that cause leaking getting in the car    Urinary urgency Yes    Urinary frequency goes to the bathroom every hour    Fecal incontinence No    Skin Integrity Intact;Irritaion present at    Perineal Body/Introitus  Other   tight   Pelvic Floor Internal Exam Patient confirmed identification and approves PT to assess pelvic floor and treatment    Exam Type Vaginal    Palpation tenderness located in the obtruator internist, levator ani, perineal body, holds breath with contraction; ring of tissue up the vaginal vault on the left side 2 inches above the entrance    Strength weak squeeze, no lift   less contraction on the sides of the vaginal canal   Strength # of seconds 1    Tone increased tone                      PT Short Term Goals - 12/22/19 1034      PT SHORT TERM GOAL #1   Title independent with initial HEP    Baseline not educated yet    Time 4    Period Weeks    Status  New    Target Date 01/19/20      PT SHORT TERM GOAL #2   Title educated on vaginal moisturizers to improve tissue integrity    Baseline not educated yet    Time 4    Period Weeks    Status New    Target Date 01/19/20      PT SHORT TERM GOAL #3   Title educated on ways to use dilators to not increase abdominal pressure so they will stay in the vaginal canal    Time 4    Period Weeks    Status New    Target Date 01/19/20             PT Long Term Goals - 12/22/19 1035      PT LONG TERM GOAL #1   Title independent with advanced exercises for strength, flexibility, and endurance    Baseline not educated yet    Time 10    Period Weeks    Status New    Target Date 03/01/20      PT LONG TERM GOAL #2   Title able to use the largest dilator with no pain so she is able to having penile penetration with minimal to no difficulty    Baseline on the 4th dilator, not able to have penile penetration due to pain and being anxious  Time 10    Period Weeks    Status New    Target Date 03/01/20      PT LONG TERM GOAL #3   Title urinary leakage decreased >/= 75% and down to 0-1 thin pad due to increased pelvic floor strength and increased tissue mobility    Baseline uses 2 thick pads; pelvic floor strength 1-2/5    Time 10    Period Weeks    Status New    Target Date 03/01/20      PT LONG TERM GOAL #4   Title able to walk for 20 minutes in the store and for exercise without having to go to the bathroom due to increased endrance and pelvic floor strength    Baseline walk 300 feet then has to go to the bathroom    Time 10    Period Weeks    Status New    Target Date 03/01/20      PT LONG TERM GOAL #5   Title sit to stand </= 12 seconds due to improved strength and balance to reduce her risk of falls    Baseline sit to stand 22 seconds    Time 10    Period Weeks    Status New    Target Date 03/01/20                  Plan - 12/22/19 0939    Clinical Impression  Statement Patient is a 44 year old female s/p cervical cancer with radiation from 03/23/2019 to 06/05/2019. She is using dilators to the vaginal area and is on the 4th one out of 5. She is having some discomfort with the dilators and will push the dilators out with moving her body. Patient will leak urine with the urge to urinate, walking 300 feet, laugh, sneeze, hearing running water, and with movement. Pelvic floor strength is 2/5 anterior and posterior and lateral sides are 1/5. Patient has tenderness located in the perineal body, levator ani, and obturator internist. She has weakness in bilateral hips and abdoment. Patient posture consists of increased cervical thoracic kyphosis, forward head, rounded shoulders, short waisted, and increased abdominal girth. Patient has tenderness located in lower abdomen. Sit to stand in 22 seconds which indicates risk of falls. Patient has difficulty with rolling in bed due to decreased mobility and increased weight gain. Patient is unable to go on a walking program or exercise due to her urinary leakage. Patient would benefit from skilled PT to improve mobility, increase tissue elongation, increase endurance and balance and increase strength to reduce urinary leakage.    Personal Factors and Comorbidities Age;Fitness;Comorbidity 3+;Sex;Time since onset of injury/illness/exacerbation    Comorbidities Cervical cancer 03/18/2019 with radiation 03/23/2019-06/05/2019; Diabetes; C-section 11/11/2014; nueropathy in toes    Examination-Activity Limitations Locomotion Level;Bed Mobility;Squat;Continence;Stairs;Stand;Lift;Toileting    Examination-Participation Restrictions Meal Prep;Cleaning;Community Activity;Interpersonal Relationship;Laundry    Stability/Clinical Decision Making Evolving/Moderate complexity    Clinical Decision Making Moderate    Rehab Potential Good    PT Frequency 2x / week    PT Duration Other (comment)   10 weeks   PT Treatment/Interventions ADLs/Self Care Home  Management;Biofeedback;Functional mobility training;Therapeutic activities;Therapeutic exercise;Balance training;Neuromuscular re-education;Manual techniques;Patient/family education;Dry needling;Energy conservation;Passive range of motion;Spinal Manipulations;Joint Manipulations    PT Next Visit Plan manual work tot he pelvic floor; engagement of the abdomen; lymph drainage for the abdomen; hip stretches; diaphragmatic breathing; vaginal moisturizers    Consulted and Agree with Plan of Care Patient  Patient will benefit from skilled therapeutic intervention in order to improve the following deficits and impairments:  Decreased coordination, Decreased range of motion, Difficulty walking, Increased fascial restricitons, Decreased endurance, Increased muscle spasms, Pain, Decreased activity tolerance, Decreased balance, Impaired flexibility, Decreased strength, Decreased mobility  Visit Diagnosis: Muscle weakness (generalized) - Plan: PT plan of care cert/re-cert  Cramp and spasm - Plan: PT plan of care cert/re-cert  Other abnormalities of gait and mobility - Plan: PT plan of care cert/re-cert  Other urinary incontinence - Plan: PT plan of care cert/re-cert     Problem List Patient Active Problem List   Diagnosis Date Noted  . Obesity, Class II, BMI 35-39.9 09/07/2019  . Dehydration 05/30/2019  . Peripheral neuropathy due to chemotherapy ([place]) 04/25/2019  . Diarrhea 04/04/2019  . CKD (chronic kidney disease), symptom management only, stage 3 (moderate) ([place]) 03/30/2019  . Generalized anxiety disorder 03/30/2019  . Essential hypertension   . Anxiety 03/23/2019  . Squamous cell carcinoma of cervix ([place]) 03/23/2019  . Deficiency anemia 03/22/2019  . Squamous cell carcinoma in situ (SCCIS) of cervix 03/22/2019    Earlie Counts, PT 12/22/19 10:41 AM   [facility] [address], [place], [place], [address] Phone:  [phone]   Fax:  [phone]  Name: Tamara Osborne MRN: [id number] Date of Birth: 18-Apr-1975

## 2019-12-25 ENCOUNTER — Ambulatory Visit: Payer: Medicaid Other | Admitting: Physical Therapy

## 2019-12-26 ENCOUNTER — Other Ambulatory Visit: Payer: Self-pay

## 2019-12-26 ENCOUNTER — Ambulatory Visit (INDEPENDENT_AMBULATORY_CARE_PROVIDER_SITE_OTHER): Payer: Medicaid Other | Admitting: Family Medicine

## 2019-12-26 ENCOUNTER — Encounter (INDEPENDENT_AMBULATORY_CARE_PROVIDER_SITE_OTHER): Payer: Self-pay | Admitting: Family Medicine

## 2019-12-26 VITALS — BP 149/89 | HR 97 | Temp 97.9°F | Ht 61.0 in | Wt 211.0 lb

## 2019-12-26 DIAGNOSIS — E8881 Metabolic syndrome: Secondary | ICD-10-CM | POA: Diagnosis not present

## 2019-12-26 DIAGNOSIS — I10 Essential (primary) hypertension: Secondary | ICD-10-CM

## 2019-12-26 DIAGNOSIS — G4733 Obstructive sleep apnea (adult) (pediatric): Secondary | ICD-10-CM | POA: Diagnosis not present

## 2019-12-26 DIAGNOSIS — Z6841 Body Mass Index (BMI) 40.0 and over, adult: Secondary | ICD-10-CM

## 2019-12-26 MED ORDER — VICTOZA 18 MG/3ML ~~LOC~~ SOPN: 1.5000 mg | PEN_INJECTOR | Freq: Every day | SUBCUTANEOUS | 0 refills | Status: DC

## 2019-12-26 NOTE — Progress Notes (Signed)
Thank you very much for this update.  I'm so happy for her.  Thank you so very much for your care.   Tamara Osborne

## 2019-12-27 ENCOUNTER — Other Ambulatory Visit: Payer: Self-pay

## 2019-12-27 ENCOUNTER — Ambulatory Visit: Payer: Medicaid Other | Admitting: Physical Therapy

## 2019-12-27 ENCOUNTER — Encounter: Payer: Self-pay | Admitting: Physical Therapy

## 2019-12-27 DIAGNOSIS — R252 Cramp and spasm: Secondary | ICD-10-CM

## 2019-12-27 DIAGNOSIS — R2689 Other abnormalities of gait and mobility: Secondary | ICD-10-CM

## 2019-12-27 DIAGNOSIS — M6281 Muscle weakness (generalized): Secondary | ICD-10-CM

## 2019-12-27 DIAGNOSIS — N39498 Other specified urinary incontinence: Secondary | ICD-10-CM

## 2019-12-27 NOTE — Therapy (Signed)
[facility] [address] [place], [place], [address] Phone: [phone]   Fax:  [phone]  Physical Therapy Treatment  Patient Details  Name: Tamara Osborne MRN: [id number] Date of Birth: 12/30/1975 Referring Provider (PT): Dr. Gery Pray   Encounter Date: 12/27/2019   PT End of Session - 12/27/19 1722    Visit Number 2    Date for PT Re-Evaluation 03/01/20    Authorization Type [employer]    Authorization - Visit Number 2    Authorization - Number of Visits 27    PT Start Time 1100    PT Stop Time 1145    PT Time Calculation (min) 45 min    Activity Tolerance Patient tolerated treatment well;No increased pain    Behavior During Therapy WFL for tasks assessed/performed           Past Medical History:  Diagnosis Date   Anemia    Anxiety    Bartholin cyst    Cervical cancer ([place])    Constipation    Depression    Deviated septum    Diabetes mellitus without complication ([place])    Difficult intravenous access    used port for 05-09-2019 surgery   Fibroids    History of blood transfusion    2 units given 04-26-2019, has had total of 8 units since jan 2021   History of blood transfusion 1 unit given 05-30-19   iv fluids also given   History of kidney problems    History of recent blood transfusion 05/16/2019   2 units given  per dr Pollyann Savoy at cancer center   Hypertension    Neuropathy    both thumbs   Obesity    Palpitations    PONV (postoperative nausea and vomiting)    Prediabetes    Preeclampsia 2006   Sleep apnea    no cpap used insurance would not cover, osa severe per pt   SOB (shortness of breath)    Swallowing difficulty     Past Surgical History:  Procedure Laterality Date   CESAREAN SECTION WITH BILATERAL TUBAL LIGATION  11/10/2004       DILATION AND CURETTAGE OF UTERUS  1997   IR IMAGING GUIDED PORT INSERTION  04/04/2019   OPERATIVE ULTRASOUND N/A  05/09/2019   Procedure: OPERATIVE ULTRASOUND;  Surgeon: Gery Pray, MD;  Location: [place];  Service: Urology;  Laterality: N/A;   OPERATIVE ULTRASOUND N/A 05/15/2019   Procedure: OPERATIVE ULTRASOUND;  Surgeon: Gery Pray, MD;  Location: [facility];  Service: Urology;  Laterality: N/A;   OPERATIVE ULTRASOUND N/A 05/22/2019   Procedure: OPERATIVE ULTRASOUND;  Surgeon: Gery Pray, MD;  Location: [facility];  Service: Urology;  Laterality: N/A;   OPERATIVE ULTRASOUND N/A 05/29/2019   Procedure: OPERATIVE ULTRASOUND;  Surgeon: Gery Pray, MD;  Location: [facility];  Service: Urology;  Laterality: N/A;   OPERATIVE ULTRASOUND N/A 06/05/2019   Procedure: OPERATIVE ULTRASOUND;  Surgeon: Gery Pray, MD;  Location: [facility];  Service: Urology;  Laterality: N/A;   TANDEM RING INSERTION N/A 05/09/2019   Procedure: TANDEM RING INSERTION;  Surgeon: Gery Pray, MD;  Location: [facility];  Service: Urology;  Laterality: N/A;   TANDEM RING INSERTION N/A 05/15/2019   Procedure: TANDEM RING INSERTION;  Surgeon: Gery Pray, MD;  Location: [facility];  Service: Urology;  Laterality: N/A;   TANDEM RING INSERTION N/A 05/22/2019   Procedure: TANDEM  RING INSERTION;  Surgeon: Gery Pray, MD;  Location: [facility];  Service: Urology;  Laterality: N/A;   TANDEM RING INSERTION N/A 05/29/2019   Procedure: TANDEM RING INSERTION;  Surgeon: Gery Pray, MD;  Location: [facility];  Service: Urology;  Laterality: N/A;   TANDEM RING INSERTION N/A 06/05/2019   Procedure: TANDEM RING INSERTION;  Surgeon: Gery Pray, MD;  Location: [facility];  Service: Urology;  Laterality: N/A;    There were no vitals filed for this visit.   Subjective Assessment - 12/27/19 1106    Subjective I have the dilators. When it gets colder it is harder to put my  foot on the floor.    Pertinent History Cervical cancer; radiation 03/23/2019-06/05/2019    Patient Stated Goals improve urinary incontinence, help with use of dilators    Currently in Pain? Yes    Pain Score 5     Pain Location Vagina    Pain Orientation Lower    Pain Descriptors / Indicators Sore    Pain Type Acute pain    Pain Onset More than a month ago    Pain Frequency Constant    Aggravating Factors  after using the dilator    Pain Relieving Factors not using the dialtor    Multiple Pain Sites No                             [facility] Adult PT Treatment/Exercise - 12/27/19 0001      Self-Care   Self-Care Other Self-Care Comments    Other Self-Care Comments  education on medtitation to relax the pelvic floor with using a dilator, use a ball to massage the feet to relax the pelvic floor      Therapeutic Activites    Therapeutic Activities Other Therapeutic Activities    Other Therapeutic Activities had patient insert the dilator into the vaginal canal why having her inclined, educating her to breath out while she is reaching down to place the dilator into the canal, using a towel between her legs to prevent the dilator to move outward, and pillow under her knees to assist the pelvis floor to relax      Manual Therapy   Manual Therapy Soft tissue mobilization;Manual Lymphatic Drainage (MLD)    Manual therapy comments educated patient on self manual lymph drainage to reduce the swelling of her abdomen    Soft tissue mobilization circular massage to abdomen to improve swelling in the abdomen    Manual Lymphatic Drainage (MLD) to abdomen with clearing the lower abdomen, going through 9 points with pressure to reduce swelling                  PT Education - 12/27/19 1144    Education Details you tube videos for meditation; lymphatic clearing for abdomen; went over how to use dilators; using a ball to massage feet    Person(s) Educated Patient    Methods  Explanation;Demonstration;Verbal cues;Handout   video taped for lymphatic system   Comprehension Returned demonstration;Verbalized understanding            PT Short Term Goals - 12/22/19 1034      PT SHORT TERM GOAL #1   Title independent with initial HEP    Baseline not educated yet    Time 4    Period Weeks    Status New    Target Date 01/19/20      PT  SHORT TERM GOAL #2   Title educated on vaginal moisturizers to improve tissue integrity    Baseline not educated yet    Time 4    Period Weeks    Status New    Target Date 01/19/20      PT SHORT TERM GOAL #3   Title educated on ways to use dilators to not increase abdominal pressure so they will stay in the vaginal canal    Time 4    Period Weeks    Status New    Target Date 01/19/20             PT Long Term Goals - 12/22/19 1035      PT LONG TERM GOAL #1   Title independent with advanced exercises for strength, flexibility, and endurance    Baseline not educated yet    Time 10    Period Weeks    Status New    Target Date 03/01/20      PT LONG TERM GOAL #2   Title able to use the largest dilator with no pain so she is able to having penile penetration with minimal to no difficulty    Baseline on the 4th dilator, not able to have penile penetration due to pain and being anxious    Time 10    Period Weeks    Status New    Target Date 03/01/20      PT LONG TERM GOAL #3   Title urinary leakage decreased >/= 75% and down to 0-1 thin pad due to increased pelvic floor strength and increased tissue mobility    Baseline uses 2 thick pads; pelvic floor strength 1-2/5    Time 10    Period Weeks    Status New    Target Date 03/01/20      PT LONG TERM GOAL #4   Title able to walk for 20 minutes in the store and for exercise without having to go to the bathroom due to increased endrance and pelvic floor strength    Baseline walk 300 feet then has to go to the bathroom    Time 10    Period Weeks    Status New     Target Date 03/01/20      PT LONG TERM GOAL #5   Title sit to stand </= 12 seconds due to improved strength and balance to reduce her risk of falls    Baseline sit to stand 22 seconds    Time 10    Period Weeks    Status New    Target Date 03/01/20                 Plan - 12/27/19 1722    Clinical Impression Statement Patient will increase her abdominal pressure with holding her breath when reaching down to place her dilator into the vaginal canal. Patient has increased firmness in her abdomen so she was educated on manual lymph system to stimulate it and reduce the swelling. Patient is on the small plus dilator. She will benefit from skilled therapy to improve mobility, increase tissue elongation, increase endurance and balance and increase strength to reduce urinary leakage.    Personal Factors and Comorbidities Age;Fitness;Comorbidity 3+;Sex;Time since onset of injury/illness/exacerbation    Comorbidities Cervical cancer 03/18/2019 with radiation 03/23/2019-06/05/2019; Diabetes; C-section 11/11/2014; nueropathy in toes    Examination-Participation Restrictions Meal Prep;Cleaning;Community Activity;Interpersonal Relationship;Laundry    Stability/Clinical Decision Making Evolving/Moderate complexity    Rehab Potential Good    PT  Frequency 2x / week    PT Duration Other (comment)   10 weeks   PT Treatment/Interventions ADLs/Self Care Home Management;Biofeedback;Functional mobility training;Therapeutic activities;Therapeutic exercise;Balance training;Neuromuscular re-education;Manual techniques;Patient/family education;Dry needling;Energy conservation;Passive range of motion;Spinal Manipulations;Joint Manipulations    PT Next Visit Plan manual work tot he pelvic floor; engagement of the abdomen; lymph drainage for the abdomen; hip stretches; diaphragmatic breathing; vaginal moisturizers    Recommended Other Services MD signed initial eval    Consulted and Agree with Plan of Care Patient            Patient will benefit from skilled therapeutic intervention in order to improve the following deficits and impairments:  Decreased coordination, Decreased range of motion, Difficulty walking, Increased fascial restricitons, Decreased endurance, Increased muscle spasms, Pain, Decreased activity tolerance, Decreased balance, Impaired flexibility, Decreased strength, Decreased mobility  Visit Diagnosis: Muscle weakness (generalized)  Cramp and spasm  Other abnormalities of gait and mobility  Other urinary incontinence     Problem List Patient Active Problem List   Diagnosis Date Noted   Obesity, Class II, BMI 35-39.9 09/07/2019   Dehydration 05/30/2019   Peripheral neuropathy due to chemotherapy ([place]) 04/25/2019   Diarrhea 04/04/2019   CKD (chronic kidney disease), symptom management only, stage 3 (moderate) (HCC) 03/30/2019   Generalized anxiety disorder 03/30/2019   Essential hypertension    Anxiety 03/23/2019   Squamous cell carcinoma of cervix ([place]) 03/23/2019   Deficiency anemia 03/22/2019   Squamous cell carcinoma in situ (SCCIS) of cervix 03/22/2019    Earlie Counts, PT 12/27/19 5:26 PM   [facility] [address] [place], [place], [address] Phone: [phone]   Fax:  [phone]  Name: Tamara Osborne MRN: [id number] Date of Birth: 1976-01-28

## 2019-12-27 NOTE — Progress Notes (Signed)
Chief Complaint:   OBESITY Tamara Osborne is here to discuss her progress with her obesity treatment plan along with follow-up of her obesity related diagnoses. Tamara Osborne is on the Category 1 Plan + 100 calories and states she is following her eating plan approximately 90% of the time. Tamara Osborne states she is doing physical therapy for 45 minutes 2 times per week.  Today's visit was #: 7 Starting weight: 208 lbs Starting date: 09/20/2019 Today's weight: 212 lbs Today's date: 12/26/2019 Total lbs lost to date: 0 Total lbs lost since last in-office visit: 0  Interim History: Tamara Osborne found out she has severe obstructive sleep apnea, and she started pelvic floor physical therapy in the last 3 weeks. Last few weeks she has been increasing activity. Limiting eating out. Doing eggs and toast for breakfast, cheese and yogurt for snack, deli meat sandwich for lunch, and 6 oz of meat at dinner.  Subjective:   1. Insulin resistance Tamara Osborne is on 0.9 mg SubQ daily Victoza. She notes minimal if any side effects.  2. Essential hypertension Tamara Osborne's blood pressure is slightly elevated today. She denies chest pain, chest pressure, or headache. She is on amlodipine and Diovan.  3. OSA (obstructive sleep apnea) Tamara Osborne is seeing Dr. Rexene Alberts. She has severe obstructive sleep apnea per her sleep study.  Assessment/Plan:   1. Insulin resistance Illyanna will continue to work on weight loss, exercise, and decreasing simple carbohydrates to help decrease the risk of diabetes. Zharia agreed to increase Victoza to 1.2 mg SubQ daily with no refills (previously on 0.9 mg). Casara agreed to follow-up with [place] as directed to closely monitor her progress.  - liraglutide (VICTOZA) 18 MG/3ML SOPN; Inject 1.5 mg into the skin daily.  Dispense: 9 mL; Refill: 0  2. Essential hypertension Ethyl will continue her medications at her current dose, and will continue working on healthy weight loss and exercise to  improve blood pressure control. We will watch for signs of hypotension as she continues her lifestyle modifications.  3. OSA (obstructive sleep apnea) Intensive lifestyle modifications are the first line treatment for this issue. We discussed several lifestyle modifications today. Tamara Osborne will continue to follow up with Dr. Rexene Alberts, and will continue to work on diet, exercise and weight loss efforts. We will continue to monitor. Orders and follow up as documented in patient record.   4. Class 3 severe obesity with serious comorbidity and body mass index (BMI) of 40.0 to 44.9 in adult, unspecified obesity type (HCC) Tamara Osborne is currently in the action stage of change. As such, her goal is to continue with weight loss efforts. She has agreed to the Category 1 Plan 100 calories.   We will repeat IC at her next appointment.  Exercise goals: As is.  Behavioral modification strategies: increasing lean protein intake, meal planning and cooking strategies, keeping healthy foods in the home and planning for success.  Tamara Osborne has agreed to follow-up with our clinic in 2 to 3 weeks. She was informed of the importance of frequent follow-up visits to maximize her success with intensive lifestyle modifications for her multiple health conditions.   Objective:   Blood pressure (!) 149/89, pulse 97, temperature 97.9 F (36.6 C), temperature source Oral, height 5\' 1"  (1.549 m), weight 211 lb (95.7 kg), SpO2 99 %. Body mass index is 39.87 kg/m.  General: Cooperative, alert, well developed, in no acute distress. HEENT: Conjunctivae and lids unremarkable. Cardiovascular: Regular rhythm.  Lungs: Normal work of breathing. Neurologic: No focal deficits.  Lab Results  Component Value Date   CREATININE 1.35 (H) 09/20/2019   BUN 20 09/20/2019   NA 140 09/20/2019   K 4.5 09/20/2019   CL 105 09/20/2019   CO2 20 09/20/2019   Lab Results  Component Value Date   ALT 29 09/20/2019   AST 20 09/20/2019    ALKPHOS 111 09/20/2019   BILITOT 0.3 09/20/2019   Lab Results  Component Value Date   HGBA1C 5.2 10/30/2019   HGBA1C CANCELED 09/20/2019   Lab Results  Component Value Date   INSULIN 40.3 (H) 09/20/2019   Lab Results  Component Value Date   TSH 1.910 09/20/2019   Lab Results  Component Value Date   CHOL 222 (H) 09/20/2019   HDL 34 (L) 09/20/2019   LDLCALC 158 (H) 09/20/2019   TRIG 162 (H) 09/20/2019   Lab Results  Component Value Date   WBC 6.6 08/07/2019   HGB 11.8 (L) 08/07/2019   HCT 34.8 (L) 08/07/2019   MCV 87.7 08/07/2019   PLT 267 08/07/2019   Lab Results  Component Value Date   IRON 17 (L) 03/27/2019   TIBC 283 03/27/2019   FERRITIN 32 03/27/2019   Attestation Statements:   Reviewed by clinician on day of visit: allergies, medications, problem list, medical history, surgical history, family history, social history, and previous encounter notes.  Time spent on visit including pre-visit chart review and post-visit care and charting was 25 minutes.    I, Trixie Dredge, am acting as transcriptionist for Coralie Common, MD.  I have reviewed the above documentation for accuracy and completeness, and I agree with the above. - Jinny Blossom, MD

## 2019-12-27 NOTE — Patient Instructions (Addendum)
   Guided Meditation for Pelvic Floor Relaxation  [employer]     10-Minute Breath Meditation for Pelvic Health and Healing  Roll a ball under feet to relax the pelvic floor and relax feet  [facility] [address] [place], [place] [address] Phone # [phone] Fax [phone]

## 2020-01-03 ENCOUNTER — Other Ambulatory Visit (INDEPENDENT_AMBULATORY_CARE_PROVIDER_SITE_OTHER): Payer: Self-pay | Admitting: Family Medicine

## 2020-01-03 ENCOUNTER — Encounter (INDEPENDENT_AMBULATORY_CARE_PROVIDER_SITE_OTHER): Payer: Self-pay | Admitting: Family Medicine

## 2020-01-03 DIAGNOSIS — E8881 Metabolic syndrome: Secondary | ICD-10-CM

## 2020-01-03 DIAGNOSIS — E88819 Insulin resistance, unspecified: Secondary | ICD-10-CM

## 2020-01-04 ENCOUNTER — Other Ambulatory Visit (INDEPENDENT_AMBULATORY_CARE_PROVIDER_SITE_OTHER): Payer: Self-pay

## 2020-01-04 ENCOUNTER — Other Ambulatory Visit: Payer: Self-pay

## 2020-01-04 ENCOUNTER — Ambulatory Visit: Payer: Medicaid Other | Attending: Radiation Oncology | Admitting: Physical Therapy

## 2020-01-04 ENCOUNTER — Encounter: Payer: Self-pay | Admitting: Physical Therapy

## 2020-01-04 DIAGNOSIS — N39498 Other specified urinary incontinence: Secondary | ICD-10-CM | POA: Diagnosis present

## 2020-01-04 DIAGNOSIS — R2689 Other abnormalities of gait and mobility: Secondary | ICD-10-CM | POA: Diagnosis present

## 2020-01-04 DIAGNOSIS — M6281 Muscle weakness (generalized): Secondary | ICD-10-CM

## 2020-01-04 DIAGNOSIS — R252 Cramp and spasm: Secondary | ICD-10-CM | POA: Diagnosis present

## 2020-01-04 DIAGNOSIS — E8881 Metabolic syndrome: Secondary | ICD-10-CM

## 2020-01-04 MED ORDER — VICTOZA 18 MG/3ML ~~LOC~~ SOPN: 1.8000 mg | PEN_INJECTOR | Freq: Every day | SUBCUTANEOUS | 0 refills | Status: DC

## 2020-01-04 NOTE — Patient Instructions (Addendum)
Moisturizers . They are used in the vagina to hydrate the mucous membrane that make up the vaginal canal. . Designed to keep a more normal acid balance (ph) . Once placed in the vagina, it will last between two to three days.  . Use 2-3 times per week at bedtime  . Ingredients to avoid is glycerin and fragrance, can increase chance of infection . Should not be used just before sex due to causing irritation . Most are gels administered either in a tampon-shaped applicator or as a vaginal suppository. They are non-hormonal.   Types of Moisturizers(internal use)  . Vitamin E vaginal suppositories- Whole foods, Amazon . Moist Again . Coconut oil- can break down condoms . Julva- (Do no use if on Tamoxifen) amazon . Yes moisturizer- amazon . NeuEve Silk , NeuEve Silver for menopausal or over 65 (if have severe vaginal atrophy or cancer treatments use NeuEve Silk for  1 month than move to NeuEve Silver)- Amazon, Neuve.com . Olive and Bee intimate cream- www.oliveandbee.com.au . Mae vaginal moisturizer- Amazon . Aloe .    Creams to use externally on the Vulva area  Desert Harvest Releveum (good for for cancer patients that had radiation to the area)- amazon or www.desertharvest.com  V-magic cream - amazon  Julva-amazon  Vital "V Wild Yam salve ( help moisturize and help with thinning vulvar area, does have Beeswax  MoodMaid Botanical Pro-Meno Wild Yam Cream- Amazon  Desert Harvest Gele  Cleo by Damiva labial moisturizer (Amazon,   Coconut or olive oil  aloe   Things to avoid in the vaginal area . Do not use things to irritate the vulvar area . No lotions just specialized creams for the vulva area- Neogyn, V-magic, No soaps; can use Aveeno or Calendula cleanser if needed. Must be gentle . No deodorants . No douches . Good to sleep without underwear to let the vaginal area to air out . No scrubbing: spread the lips to let warm water rinse over labias and pat dry  [facility] [address], [address] [place], [place] [address] Phone # [phone] Fax [phone]  

## 2020-01-04 NOTE — Telephone Encounter (Signed)
Last seen by Dr. Ukleja. 

## 2020-01-04 NOTE — Therapy (Signed)
[facility] [address], [place], [place], [address] Phone: [phone]   Fax:  [phone]  Physical Therapy Treatment  Patient Details  Name: Tamara Osborne MRN: [id number] Date of Birth: [birth year] Referring Provider (PT): Dr. Gery Pray   Encounter Date: 01/04/2020   PT End of Session - 01/04/20 1134    Visit Number 3    Date for PT Re-Evaluation 03/01/20    Authorization Type [employer]    Authorization - Visit Number 3    Authorization - Number of Visits 27    PT Start Time 0930    PT Stop Time 3016    PT Time Calculation (min) 45 min    Activity Tolerance Patient tolerated treatment well;No increased pain    Behavior During Therapy WFL for tasks assessed/performed           Past Medical History:  Diagnosis Date  . Anemia   . Anxiety   . Bartholin cyst   . Cervical cancer ([place])   . Constipation   . Depression   . Deviated septum   . Diabetes mellitus without complication ([place])   . Difficult intravenous access    used port for 05-09-2019 surgery  . Fibroids   . History of blood transfusion    2 units given 04-26-2019, has had total of 8 units since jan 2021  . History of blood transfusion 1 unit given 05-30-19   iv fluids also given  . History of kidney problems   . History of recent blood transfusion 05/16/2019   2 units given  per dr Pollyann Savoy at cancer center  . Hypertension   . Neuropathy    both thumbs  . Obesity   . Palpitations   . PONV (postoperative nausea and vomiting)   . Prediabetes   . Preeclampsia 2006  . Sleep apnea    no cpap used insurance would not cover, osa severe per pt  . SOB (shortness of breath)   . Swallowing difficulty     Past Surgical History:  Procedure Laterality Date  . CESAREAN SECTION WITH BILATERAL TUBAL LIGATION  11/10/2004      . DILATION AND CURETTAGE OF UTERUS  1997  . IR IMAGING GUIDED PORT INSERTION  04/04/2019  . OPERATIVE ULTRASOUND N/A  05/09/2019   Procedure: OPERATIVE ULTRASOUND;  Surgeon: Gery Pray, MD;  Location: [facility];  Service: Urology;  Laterality: N/A;  . OPERATIVE ULTRASOUND N/A 05/15/2019   Procedure: OPERATIVE ULTRASOUND;  Surgeon: Gery Pray, MD;  Location: [facility];  Service: Urology;  Laterality: N/A;  . OPERATIVE ULTRASOUND N/A 05/22/2019   Procedure: OPERATIVE ULTRASOUND;  Surgeon: Gery Pray, MD;  Location: [facility];  Service: Urology;  Laterality: N/A;  . OPERATIVE ULTRASOUND N/A 05/29/2019   Procedure: OPERATIVE ULTRASOUND;  Surgeon: Gery Pray, MD;  Location: [facility];  Service: Urology;  Laterality: N/A;  . OPERATIVE ULTRASOUND N/A 06/05/2019   Procedure: OPERATIVE ULTRASOUND;  Surgeon: Gery Pray, MD;  Location: [facility];  Service: Urology;  Laterality: N/A;  . TANDEM RING INSERTION N/A 05/09/2019   Procedure: TANDEM RING INSERTION;  Surgeon: Gery Pray, MD;  Location: [facility];  Service: Urology;  Laterality: N/A;  . TANDEM RING INSERTION N/A 05/15/2019   Procedure: TANDEM RING INSERTION;  Surgeon: Gery Pray, MD;  Location: [facility];  Service: Urology;  Laterality: N/A;  . TANDEM RING INSERTION N/A 05/22/2019   Procedure: TANDEM  RING INSERTION;  Surgeon: Gery Pray, MD;  Location: [facility];  Service: Urology;  Laterality: N/A;  . TANDEM RING INSERTION N/A 05/29/2019   Procedure: TANDEM RING INSERTION;  Surgeon: Gery Pray, MD;  Location: [facility];  Service: Urology;  Laterality: N/A;  . TANDEM RING INSERTION N/A 06/05/2019   Procedure: TANDEM RING INSERTION;  Surgeon: Gery Pray, MD;  Location: [facility];  Service: Urology;  Laterality: N/A;    There were no vitals filed for this visit.   Subjective Assessment - 01/04/20 0938    Subjective I have been doing the lymph drainage. The day I left I felt the  difference in the abdomen.    Pertinent History Cervical cancer; radiation 03/23/2019-06/05/2019    Patient Stated Goals improve urinary incontinence, help with use of dilators    Currently in Pain? Yes    Pain Score 5     Pain Location Vagina    Pain Orientation Lower    Pain Descriptors / Indicators Sore;Cramping    Pain Type Acute pain    Pain Onset More than a month ago    Pain Frequency Constant    Aggravating Factors  after using the dilator    Pain Relieving Factors not using the dilator    Multiple Pain Sites No                          Pelvic Floor Special Questions - 01/04/20 0001    Pelvic Floor Internal Exam Patient confirmed identification and approves PT to assess pelvic floor and treatment    Exam Type Vaginal    Palpation ring like tissue along the deeper vaginal vault    Strength weak squeeze, no lift             [facility] Adult PT Treatment/Exercise - 01/04/20 0001      Self-Care   Self-Care Other Self-Care Comments    Other Self-Care Comments  educated patient on vaginal health and using mositurizers to reduce dryness      Lumbar Exercises: Stretches   Hip Flexor Stretch Right;Left;1 rep;60 seconds    Hip Flexor Stretch Limitations prone foam roll    ITB Stretch Right;Left;1 rep;60 seconds    ITB Stretch Limitations sidely on foam roll    Other Lumbar Stretch Exercise sitting butterfly stretch with diaphgramatic breathing and lost of VC and tactile cues      Lumbar Exercises: Aerobic   Nustep level 2 for 6 minutes while asessing patient for progress      Manual Therapy   Manual Therapy Internal Pelvic Floor;Myofascial release    Myofascial Release internally along the sides of the bladder and urethra with one finger internally and externally    Internal Pelvic Floor along the bulbocavernsosus, perineal body and fascial release along the sides of the bladder and on the left vaginal canal where there is a band                  PT  Education - 01/04/20 1133    Education Details vaginal moisturizers    Person(s) Educated Patient    Methods Explanation;Handout    Comprehension Verbalized understanding            PT Short Term Goals - 01/04/20 1138      PT SHORT TERM GOAL #1   Title independent with initial HEP    Baseline still learning    Time 4  Period Weeks    Status On-going      PT SHORT TERM GOAL #2   Title educated on vaginal moisturizers to improve tissue integrity    Time 4    Period Weeks    Status Achieved      PT SHORT TERM GOAL #3   Title educated on ways to use dilators to not increase abdominal pressure so they will stay in the vaginal canal    Time 4    Period Weeks    Status Achieved             PT Long Term Goals - 12/22/19 1035      PT LONG TERM GOAL #1   Title independent with advanced exercises for strength, flexibility, and endurance    Baseline not educated yet    Time 10    Period Weeks    Status New    Target Date 03/01/20      PT LONG TERM GOAL #2   Title able to use the largest dilator with no pain so she is able to having penile penetration with minimal to no difficulty    Baseline on the 4th dilator, not able to have penile penetration due to pain and being anxious    Time 10    Period Weeks    Status New    Target Date 03/01/20      PT LONG TERM GOAL #3   Title urinary leakage decreased >/= 75% and down to 0-1 thin pad due to increased pelvic floor strength and increased tissue mobility    Baseline uses 2 thick pads; pelvic floor strength 1-2/5    Time 10    Period Weeks    Status New    Target Date 03/01/20      PT LONG TERM GOAL #4   Title able to walk for 20 minutes in the store and for exercise without having to go to the bathroom due to increased endrance and pelvic floor strength    Baseline walk 300 feet then has to go to the bathroom    Time 10    Period Weeks    Status New    Target Date 03/01/20      PT LONG TERM GOAL #5   Title sit  to stand </= 12 seconds due to improved strength and balance to reduce her risk of falls    Baseline sit to stand 22 seconds    Time 10    Period Weeks    Status New    Target Date 03/01/20                 Plan - 01/04/20 1134    Clinical Impression Statement Patiet is using the vaginal dilator and understands how to keep the dilator inside the vagina. Patient continues to have a vaginal band into the vaginal canal with it more prevelent on the left side. Patient was educated on the vaginal moisturizers for the vaginal canal and vulvar area. Patient has tried the next size dilator. Patient still leaks urine. Patient will benefit from skilled therapy to improve mobility, increase tissue elongation, increase endurance and balance and increase strength to reduce urinary leakage.    Personal Factors and Comorbidities Age;Fitness;Comorbidity 3+;Sex;Time since onset of injury/illness/exacerbation    Comorbidities Cervical cancer 03/18/2019 with radiation 03/23/2019-06/05/2019; Diabetes; C-section 11/11/2014; nueropathy in toes    Examination-Activity Limitations Locomotion Level;Bed Mobility;Squat;Continence;Stairs;Stand;Lift;Toileting    Examination-Participation Restrictions Meal Prep;Cleaning;Community Activity;Interpersonal Relationship;Laundry    Stability/Clinical Decision Making  Evolving/Moderate complexity    Rehab Potential Good    PT Frequency 2x / week    PT Duration Other (comment)   10 weeks   PT Treatment/Interventions ADLs/Self Care Home Management;Biofeedback;Functional mobility training;Therapeutic activities;Therapeutic exercise;Balance training;Neuromuscular re-education;Manual techniques;Patient/family education;Dry needling;Energy conservation;Passive range of motion;Spinal Manipulations;Joint Manipulations    PT Next Visit Plan manual work tot he pelvic floor internally and along the perineal body,  engagement of the abdomen; upper abdominal manual work; hip stretches for HEP;     Consulted and Agree with Plan of Care Patient           Patient will benefit from skilled therapeutic intervention in order to improve the following deficits and impairments:  Decreased coordination, Decreased range of motion, Difficulty walking, Increased fascial restricitons, Decreased endurance, Increased muscle spasms, Pain, Decreased activity tolerance, Decreased balance, Impaired flexibility, Decreased strength, Decreased mobility  Visit Diagnosis: Muscle weakness (generalized)  Cramp and spasm  Other abnormalities of gait and mobility  Other urinary incontinence     Problem List Patient Active Problem List   Diagnosis Date Noted  . Obesity, Class II, BMI 35-39.9 09/07/2019  . Dehydration 05/30/2019  . Peripheral neuropathy due to chemotherapy ([place]) 04/25/2019  . Diarrhea 04/04/2019  . CKD (chronic kidney disease), symptom management only, stage 3 (moderate) ([place]) 03/30/2019  . Generalized anxiety disorder 03/30/2019  . Essential hypertension   . Anxiety 03/23/2019  . Squamous cell carcinoma of cervix ([place]) 03/23/2019  . Deficiency anemia 03/22/2019  . Squamous cell carcinoma in situ (SCCIS) of cervix 03/22/2019    Earlie Counts, PT 01/04/20 11:41 AM   [facility] [address] [place], [place], [address] Phone: [phone]   Fax:  [phone]  Name: Tamara Osborne MRN: [id number] Date of Birth: 09-29-1975

## 2020-01-10 ENCOUNTER — Other Ambulatory Visit: Payer: Self-pay

## 2020-01-10 ENCOUNTER — Encounter (INDEPENDENT_AMBULATORY_CARE_PROVIDER_SITE_OTHER): Payer: Self-pay | Admitting: Adult Health

## 2020-01-10 ENCOUNTER — Ambulatory Visit (INDEPENDENT_AMBULATORY_CARE_PROVIDER_SITE_OTHER): Payer: Medicaid Other | Admitting: Adult Health

## 2020-01-10 VITALS — BP 159/89 | HR 88 | Temp 98.2°F | Ht 61.0 in | Wt 211.0 lb

## 2020-01-10 DIAGNOSIS — E559 Vitamin D deficiency, unspecified: Secondary | ICD-10-CM

## 2020-01-10 DIAGNOSIS — I10 Essential (primary) hypertension: Secondary | ICD-10-CM

## 2020-01-10 DIAGNOSIS — Z6839 Body mass index (BMI) 39.0-39.9, adult: Secondary | ICD-10-CM | POA: Insufficient documentation

## 2020-01-10 DIAGNOSIS — N183 Chronic kidney disease, stage 3 unspecified: Secondary | ICD-10-CM

## 2020-01-10 DIAGNOSIS — E7849 Other hyperlipidemia: Secondary | ICD-10-CM | POA: Insufficient documentation

## 2020-01-10 DIAGNOSIS — Z6841 Body Mass Index (BMI) 40.0 and over, adult: Secondary | ICD-10-CM | POA: Insufficient documentation

## 2020-01-10 DIAGNOSIS — E782 Mixed hyperlipidemia: Secondary | ICD-10-CM | POA: Diagnosis not present

## 2020-01-10 DIAGNOSIS — E8881 Metabolic syndrome: Secondary | ICD-10-CM

## 2020-01-10 HISTORY — DX: Vitamin D deficiency, unspecified: E55.9

## 2020-01-10 MED ORDER — VITAMIN D (ERGOCALCIFEROL) 1.25 MG (50000 UNIT) PO CAPS: 50000.0000 [IU] | ORAL_CAPSULE | ORAL | 0 refills | Status: DC

## 2020-01-10 NOTE — Progress Notes (Signed)
Chief Complaint:   OBESITY Tamara Osborne is here to discuss her progress with her obesity treatment plan along with follow-up of her obesity related diagnoses. Tamara Osborne is on the Category 1 Plan + 100 calories and states she is following her eating plan approximately 90% of the time. Tamara Osborne states she is doing PT 30 minutes 2 times per week.  Today's visit was #: 8 Starting weight: 208 lbs Starting date: 09/20/2019 Today's weight: 212 lbs Today's date: 01/10/2020 Total lbs lost to date: 0 Total lbs lost since last in-office visit: 0  Interim History: Tamara Osborne has been using food scale to weight protein and using measuring cup to ensure adequate vegetable servings. She reports decreased appetite with increased dosage of liraglutide to 1.8 mg daily.  She denies mass in neck, dysphagia, dyspepsia, persistent hoarseness.   Subjective:   CKD (chronic kidney disease), symptom management only, stage 3 (moderate) ([place]). CMP on 09/20/2019 showed GFR of 48.  Mixed hyperlipidemia. Tamara Osborne is not on statin therapy.   Lab Results  Component Value Date   CHOL 222 (H) 09/20/2019   HDL 34 (L) 09/20/2019   LDLCALC 158 (H) 09/20/2019   TRIG 162 (H) 09/20/2019   Lab Results  Component Value Date   ALT 29 09/20/2019   AST 20 09/20/2019   ALKPHOS 111 09/20/2019   BILITOT 0.3 09/20/2019   The 10-year ASCVD risk score Tamara Bussing DC Jr., et al., 2013) is: 4%   Values used to calculate the score:     Age: 44 years     Sex: Female     Is Non-Hispanic African American: No     Diabetic: No     Tobacco smoker: No     Systolic Blood Pressure: 867 mmHg     Is BP treated: Yes     HDL Cholesterol: 34 mg/dL     Total Cholesterol: 222 mg/dL  Vitamin D deficiency. Vitamin D level on 09/20/2019 was 29.2, below goal of 50. Tamara Osborne is on Ergocalciferol. No nausea, vomiting, or muscle weakness.    Ref. Range 09/20/2019 11:06  Vitamin D, 25-Hydroxy Latest Ref Range: 30.0 - 100.0 ng/mL 29.2 (L)    Essential hypertension. Systolic blood pressure is elevated at today's office visit. [place] is on valsartan 160 mg daily and amlodipine 10 mg daily. She reports taking antihypertensives late in the morning. She denies cardiac symptoms. She was previously intolerant to lisinopril (paresthesias).  BP Readings from Last 3 Encounters:  01/10/20 (!) 159/89  12/26/19 (!) 149/89  12/14/19 (!) 168/92   Lab Results  Component Value Date   CREATININE 1.35 (H) 09/20/2019   CREATININE 1.49 (H) 08/07/2019   CREATININE 1.28 (H) 06/12/2019   Insulin resistance. Tamara Osborne has a diagnosis of insulin resistance based on her elevated fasting insulin level >5. She continues to work on diet and exercise to decrease her risk of diabetes. 07/21/201 insulin level 40.3. Tamara Osborne is on liraglutide 1.8 mg daily. She denies mass in neck, dysphagia, dyspepsia, or persistent hoarseness. She denies personal history of pancreatitis. She reports a reduction in her appetite with increased dosage of GLP-1.  Lab Results  Component Value Date   INSULIN 40.3 (H) 09/20/2019   Lab Results  Component Value Date   HGBA1C 5.2 10/30/2019   Assessment/Plan:   CKD (chronic kidney disease), symptom management only, stage 3 (moderate) ([place]). Will check labs at the [facility] -  lab orders placed.   Mixed hyperlipidemia. Will check labs at the [facility] -  lab ordered placed.  Vitamin D deficiency. Low Vitamin D level contributes to fatigue and are associated with obesity, breast, and colon cancer. She was given a refill on her Vitamin D, Ergocalciferol, (DRISDOL) 1.25 MG (50000 UNIT) CAPS capsule  every week #4 with 0 refills and will follow-up for routine testing of Vitamin D, at least 2-3 times per year to avoid over-replacement. Lab order placed.  Essential hypertension. Tamara Osborne is working on healthy weight loss and exercise to improve blood pressure control. We will watch for signs of hypotension as she continues  her lifestyle modifications. Will check labs at the [facility] - lab ordered placed.  Insulin resistance. Will check labs at the [facility] -  lab ordered placed.  Class 3 severe obesity with serious comorbidity and body mass index (BMI) of 40.0 to 44.9 in adult, unspecified obesity type (Tamara Osborne).  Tamara Osborne is currently in the action stage of change. As such, her goal is to continue with weight loss efforts. She has agreed to the Category 1 Plan + 100 calories.   Handout was provided on Thanksgiving.  She will have IC at her next visit. Will check labs at the [facility] -labs ordered.  Exercise goals: Kahlia will continue PT 30 minutes 2 times per week for treatment of neuropathy.  Behavioral modification strategies: increasing lean protein intake, no skipping meals, meal planning and cooking strategies and planning for success.  Tamara Osborne has agreed to follow-up with our clinic fasting in 2-3 weeks. She was informed of the importance of frequent follow-up visits to maximize her success with intensive lifestyle modifications for her multiple health conditions.   Objective:   Blood pressure (!) 159/89, pulse 88, temperature 98.2 F (36.8 C), height 5\' 1"  (1.549 m), weight 211 lb (95.7 kg), SpO2 98 %. Body mass index is 39.87 kg/m.  General: Cooperative, alert, well developed, in no acute distress. HEENT: Conjunctivae and lids unremarkable. Cardiovascular: Regular rhythm.  Lungs: Normal work of breathing. Neurologic: No focal deficits.   Lab Results  Component Value Date   CREATININE 1.35 (H) 09/20/2019   BUN 20 09/20/2019   NA 140 09/20/2019   K 4.5 09/20/2019   CL 105 09/20/2019   CO2 20 09/20/2019   Lab Results  Component Value Date   ALT 29 09/20/2019   AST 20 09/20/2019   ALKPHOS 111 09/20/2019   BILITOT 0.3 09/20/2019   Lab Results  Component Value Date   HGBA1C 5.2 10/30/2019   HGBA1C CANCELED 09/20/2019   Lab Results  Component Value Date   INSULIN  40.3 (H) 09/20/2019   Lab Results  Component Value Date   TSH 1.910 09/20/2019   Lab Results  Component Value Date   CHOL 222 (H) 09/20/2019   HDL 34 (L) 09/20/2019   LDLCALC 158 (H) 09/20/2019   TRIG 162 (H) 09/20/2019   Lab Results  Component Value Date   WBC 6.6 08/07/2019   HGB 11.8 (L) 08/07/2019   HCT 34.8 (L) 08/07/2019   MCV 87.7 08/07/2019   PLT 267 08/07/2019   Lab Results  Component Value Date   IRON 17 (L) 03/27/2019   TIBC 283 03/27/2019   FERRITIN 32 03/27/2019   Attestation Statements:   Reviewed by clinician on day of visit: allergies, medications, problem list, medical history, surgical history, family history, social history, and previous encounter notes.  IMichaelene Song, am acting as Location manager for [employer], NP-C   I have reviewed the above documentation for accuracy and completeness, and I  agree with the above. -  Jahad Old d. Brittnee Gaetano, NP-C

## 2020-01-11 ENCOUNTER — Ambulatory Visit: Payer: Medicaid Other | Admitting: Physical Therapy

## 2020-01-11 ENCOUNTER — Encounter: Payer: Self-pay | Admitting: Physical Therapy

## 2020-01-11 DIAGNOSIS — M6281 Muscle weakness (generalized): Secondary | ICD-10-CM

## 2020-01-11 DIAGNOSIS — R252 Cramp and spasm: Secondary | ICD-10-CM

## 2020-01-11 DIAGNOSIS — N39498 Other specified urinary incontinence: Secondary | ICD-10-CM

## 2020-01-11 DIAGNOSIS — R2689 Other abnormalities of gait and mobility: Secondary | ICD-10-CM

## 2020-01-11 NOTE — Therapy (Signed)
[facility] [address], [place], [place], [address] Phone: [phone]   Fax:  [phone]  Physical Therapy Treatment  Patient Details  Name: Tamara Osborne MRN: [id number] Date of Birth: Nov 30, 1975 Referring Provider (PT): Dr. Gery Pray   Encounter Date: 01/11/2020   PT End of Session - 01/11/20 1013    Visit Number 4    Date for PT Re-Evaluation 03/01/20    Authorization Type [employer]    Authorization Time Period 10/22-12/31    Authorization - Visit Number 4    Authorization - Number of Visits 12    PT Start Time 0930    PT Stop Time 1010    PT Time Calculation (min) 40 min    Activity Tolerance Patient tolerated treatment well;No increased pain    Behavior During Therapy WFL for tasks assessed/performed           Past Medical History:  Diagnosis Date  . Anemia   . Anxiety   . Bartholin cyst   . Cervical cancer ([place])   . Constipation   . Depression   . Deviated septum   . Diabetes mellitus without complication ([place])   . Difficult intravenous access    used port for 05-09-2019 surgery  . Fibroids   . History of blood transfusion    2 units given 04-26-2019, has had total of 8 units since jan 2021  . History of blood transfusion 1 unit given 05-30-19   iv fluids also given  . History of kidney problems   . History of recent blood transfusion 05/16/2019   2 units given  per dr Pollyann Savoy at cancer center  . Hypertension   . Neuropathy    both thumbs  . Obesity   . Palpitations   . PONV (postoperative nausea and vomiting)   . Prediabetes   . Preeclampsia 2006  . Sleep apnea    no cpap used insurance would not cover, osa severe per pt  . SOB (shortness of breath)   . Swallowing difficulty     Past Surgical History:  Procedure Laterality Date  . CESAREAN SECTION WITH BILATERAL TUBAL LIGATION  11/10/2004      . DILATION AND CURETTAGE OF UTERUS  1997  . IR IMAGING GUIDED PORT INSERTION   04/04/2019  . OPERATIVE ULTRASOUND N/A 05/09/2019   Procedure: OPERATIVE ULTRASOUND;  Surgeon: Gery Pray, MD;  Location: [facility];  Service: Urology;  Laterality: N/A;  . OPERATIVE ULTRASOUND N/A 05/15/2019   Procedure: OPERATIVE ULTRASOUND;  Surgeon: Gery Pray, MD;  Location: [facility];  Service: Urology;  Laterality: N/A;  . OPERATIVE ULTRASOUND N/A 05/22/2019   Procedure: OPERATIVE ULTRASOUND;  Surgeon: Gery Pray, MD;  Location: [facility];  Service: Urology;  Laterality: N/A;  . OPERATIVE ULTRASOUND N/A 05/29/2019   Procedure: OPERATIVE ULTRASOUND;  Surgeon: Gery Pray, MD;  Location: [facility];  Service: Urology;  Laterality: N/A;  . OPERATIVE ULTRASOUND N/A 06/05/2019   Procedure: OPERATIVE ULTRASOUND;  Surgeon: Gery Pray, MD;  Location: [facility];  Service: Urology;  Laterality: N/A;  . TANDEM RING INSERTION N/A 05/09/2019   Procedure: TANDEM RING INSERTION;  Surgeon: Gery Pray, MD;  Location: [facility];  Service: Urology;  Laterality: N/A;  . TANDEM RING INSERTION N/A 05/15/2019   Procedure: TANDEM RING INSERTION;  Surgeon: Gery Pray, MD;  Location: [facility];  Service: Urology;  Laterality: N/A;  . TANDEM RING  INSERTION N/A 05/22/2019   Procedure: TANDEM RING INSERTION;  Surgeon: Gery Pray, MD;  Location: [facility];  Service: Urology;  Laterality: N/A;  . TANDEM RING INSERTION N/A 05/29/2019   Procedure: TANDEM RING INSERTION;  Surgeon: Gery Pray, MD;  Location: [facility];  Service: Urology;  Laterality: N/A;  . TANDEM RING INSERTION N/A 06/05/2019   Procedure: TANDEM RING INSERTION;  Surgeon: Gery Pray, MD;  Location: [facility];  Service: Urology;  Laterality: N/A;    There were no vitals filed for this visit.   Subjective Assessment - 01/11/20 0935    Subjective I felt good after the  nustep. I have had some stomach pain. It may be due to my medication.    Patient Stated Goals improve urinary incontinence, help with use of dilators    Currently in Pain? Yes    Pain Score 5     Pain Location Vagina    Pain Orientation Lower    Pain Descriptors / Indicators Cramping;Sore    Pain Type Acute pain    Pain Onset More than a month ago    Pain Frequency Constant    Aggravating Factors  after using the dilator    Pain Relieving Factors not using the dilator    Multiple Pain Sites No              [facility] PT Assessment - 01/11/20 0001      Observation/Other Assessments   Skin Integrity looked at third toe and nail seems thicker than others and top of skin by the nail is red, patient reports the toe is sore                         [facility] Adult PT Treatment/Exercise - 01/11/20 0001      Self-Care   Self-Care Other Self-Care Comments    Other Self-Care Comments  educated patient on lubricants to use with dilator, she was using KY gel and was educated on how that can decrease the good bacteria in the vaginal canal      Lumbar Exercises: Stretches   Active Hamstring Stretch Right;Left;1 rep;30 seconds    Active Hamstring Stretch Limitations sitting    Piriformis Stretch Right;Left;1 rep;30 seconds    Piriformis Stretch Limitations sitting    Other Lumbar Stretch Exercise sitting hip adductor stretch right, left hold 30 sec      Lumbar Exercises: Aerobic   Nustep level 2 for 7 minutes while asessing patient for progress      Lumbar Exercises: Standing   Other Standing Lumbar Exercises sit to stand 10x trying to not use hands      Manual Therapy   Manual Therapy Soft tissue mobilization    Soft tissue mobilization circular massage around the outer and middle of the abdomen to promote peristalic motion of the intestines and release the fascia                   PT Education - 01/11/20 1012    Education Details Access Code: [email or web address]; reviewed the  vaginal lubricants and explained why the KT lubricant is not good due to the reduction of good bacteria    Person(s) Educated Patient    Methods Explanation;Demonstration;Verbal cues;Handout    Comprehension Returned demonstration;Verbalized understanding            PT Short Term Goals - 01/11/20 0948      PT SHORT TERM GOAL #1  Title independent with initial HEP    Time 4    Period Weeks    Status Achieved      PT SHORT TERM GOAL #2   Title educated on vaginal moisturizers to improve tissue integrity    Baseline educated    Time 4    Period Weeks    Status Achieved      PT SHORT TERM GOAL #3   Title educated on ways to use dilators to not increase abdominal pressure so they will stay in the vaginal canal    Baseline educated    Time 4    Period Weeks    Status Achieved             PT Long Term Goals - 12/22/19 1035      PT LONG TERM GOAL #1   Title independent with advanced exercises for strength, flexibility, and endurance    Baseline not educated yet    Time 10    Period Weeks    Status New    Target Date 03/01/20      PT LONG TERM GOAL #2   Title able to use the largest dilator with no pain so she is able to having penile penetration with minimal to no difficulty    Baseline on the 4th dilator, not able to have penile penetration due to pain and being anxious    Time 10    Period Weeks    Status New    Target Date 03/01/20      PT LONG TERM GOAL #3   Title urinary leakage decreased >/= 75% and down to 0-1 thin pad due to increased pelvic floor strength and increased tissue mobility    Baseline uses 2 thick pads; pelvic floor strength 1-2/5    Time 10    Period Weeks    Status New    Target Date 03/01/20      PT LONG TERM GOAL #4   Title able to walk for 20 minutes in the store and for exercise without having to go to the bathroom due to increased endrance and pelvic floor strength    Baseline walk 300 feet then has to go to the bathroom    Time 10     Period Weeks    Status New    Target Date 03/01/20      PT LONG TERM GOAL #5   Title sit to stand </= 12 seconds due to improved strength and balance to reduce her risk of falls    Baseline sit to stand 22 seconds    Time 10    Period Weeks    Status New    Target Date 03/01/20                 Plan - 01/11/20 0944    Clinical Impression Statement Patient reports her urinary leakage is 60% better. Patient has increased control of the urge when walking to the bathroom. Patient is on the largest dilator and will ask Cancer rehab for the next size. Patient has learned more hip stretches. Patient abdomen was very firm prior to manual work and reduced afterwards. Patient will benefit from skilled therapy to improve mobility, increase tissue elongation, increase endurance and balance and increase strength to reduce urinary leakage.    Personal Factors and Comorbidities Age;Fitness;Comorbidity 3+;Sex;Time since onset of injury/illness/exacerbation    Comorbidities Cervical cancer 03/18/2019 with radiation 03/23/2019-06/05/2019; Diabetes; C-section 11/11/2014; nueropathy in toes    Examination-Activity Limitations Locomotion Level;Bed  Mobility;Squat;Continence;Stairs;Stand;Lift;Toileting    Examination-Participation Restrictions Meal Prep;Cleaning;Community Activity;Interpersonal Relationship;Laundry    Stability/Clinical Decision Making Evolving/Moderate complexity    Rehab Potential Good    PT Frequency 2x / week    PT Duration Other (comment)   10 weeks   PT Treatment/Interventions ADLs/Self Care Home Management;Biofeedback;Functional mobility training;Therapeutic activities;Therapeutic exercise;Balance training;Neuromuscular re-education;Manual techniques;Patient/family education;Dry needling;Energy conservation;Passive range of motion;Spinal Manipulations;Joint Manipulations    PT Next Visit Plan manual work tot he pelvic floor internally and along the perineal body,  engagement of the  abdomen; sit to stand test, standing balance ex;    PT Home Exercise Plan Access Code: [email or web address]    Consulted and Agree with Plan of Care Patient           Patient will benefit from skilled therapeutic intervention in order to improve the following deficits and impairments:  Decreased coordination, Decreased range of motion, Difficulty walking, Increased fascial restricitons, Decreased endurance, Increased muscle spasms, Pain, Decreased activity tolerance, Decreased balance, Impaired flexibility, Decreased strength, Decreased mobility  Visit Diagnosis: Muscle weakness (generalized)  Cramp and spasm  Other abnormalities of gait and mobility  Other urinary incontinence     Problem List Patient Active Problem List   Diagnosis Date Noted  . Mixed hyperlipidemia 01/10/2020  . Vitamin D deficiency 01/10/2020  . Insulin resistance 01/10/2020  . Class 3 severe obesity with serious comorbidity and body mass index (BMI) of 40.0 to 44.9 in adult [facility]) 01/10/2020  . Obesity, Class II, BMI 35-39.9 09/07/2019  . Dehydration 05/30/2019  . Peripheral neuropathy due to chemotherapy ([place]) 04/25/2019  . Diarrhea 04/04/2019  . CKD (chronic kidney disease), symptom management only, stage 3 (moderate) ([place]) 03/30/2019  . Generalized anxiety disorder 03/30/2019  . Essential hypertension   . Anxiety 03/23/2019  . Squamous cell carcinoma of cervix ([place]) 03/23/2019  . Deficiency anemia 03/22/2019  . Squamous cell carcinoma in situ (SCCIS) of cervix 03/22/2019    Earlie Counts, PT 01/11/20 10:17 AM   [facility] [address] [place], [place], [address] Phone: [phone]   Fax:  [phone]  Name: Tamara Osborne MRN: [id number] Date of Birth: [birth year]

## 2020-01-11 NOTE — Patient Instructions (Signed)
Access Code: [email or web address] URL: [email or web address] Date: 01/11/2020 Prepared by: Earlie Counts  Exercises Seated Piriformis Stretch with Trunk Bend - 1 x daily - 7 x weekly - 1 sets - 2 reps - 30 sec hold Seated Hamstring Stretch - 1 x daily - 7 x weekly - 1 sets - 2 reps - 30 sec hold Seated Hip Adductor Stretch - 1 x daily - 7 x weekly - 1 sets - 2 reps - 30 sec hold Sit to Stand - 1 x daily - 7 x weekly - 1 sets - 10 reps [facility] [address], [place],  [address] Phone # [phone] Fax [phone]

## 2020-01-15 ENCOUNTER — Other Ambulatory Visit: Payer: Self-pay

## 2020-01-15 ENCOUNTER — Encounter: Payer: Self-pay | Admitting: Physical Therapy

## 2020-01-15 ENCOUNTER — Ambulatory Visit: Payer: Medicaid Other | Admitting: Physical Therapy

## 2020-01-15 DIAGNOSIS — R252 Cramp and spasm: Secondary | ICD-10-CM

## 2020-01-15 DIAGNOSIS — N39498 Other specified urinary incontinence: Secondary | ICD-10-CM

## 2020-01-15 DIAGNOSIS — M6281 Muscle weakness (generalized): Secondary | ICD-10-CM | POA: Diagnosis not present

## 2020-01-15 DIAGNOSIS — R2689 Other abnormalities of gait and mobility: Secondary | ICD-10-CM

## 2020-01-15 NOTE — Therapy (Signed)
[facility] [address], [place], [place], [address] Phone: [phone]   Fax:  [phone]  Physical Therapy Treatment  Patient Details  Name: Tamara Osborne MRN: [id number] Date of Birth: 01-30-1976 Referring Provider (PT): Dr. Gery Pray   Encounter Date: 01/15/2020   PT End of Session - 01/15/20 1111    Visit Number 5    Date for PT Re-Evaluation 03/01/20    Authorization Type [employer]    Authorization Time Period 10/22-12/31    Authorization - Visit Number 5    Authorization - Number of Visits 12    PT Start Time 1100    PT Stop Time 1140    PT Time Calculation (min) 40 min    Activity Tolerance Patient tolerated treatment well;No increased pain    Behavior During Therapy WFL for tasks assessed/performed           Past Medical History:  Diagnosis Date  . Anemia   . Anxiety   . Bartholin cyst   . Cervical cancer ([place])   . Constipation   . Depression   . Deviated septum   . Diabetes mellitus without complication ([place])   . Difficult intravenous access    used port for 05-09-2019 surgery  . Fibroids   . History of blood transfusion    2 units given 04-26-2019, has had total of 8 units since jan 2021  . History of blood transfusion 1 unit given 05-30-19   iv fluids also given  . History of kidney problems   . History of recent blood transfusion 05/16/2019   2 units given  per dr Pollyann Savoy at cancer center  . Hypertension   . Neuropathy    both thumbs  . Obesity   . Palpitations   . PONV (postoperative nausea and vomiting)   . Prediabetes   . Preeclampsia 2006  . Sleep apnea    no cpap used insurance would not cover, osa severe per pt  . SOB (shortness of breath)   . Swallowing difficulty     Past Surgical History:  Procedure Laterality Date  . CESAREAN SECTION WITH BILATERAL TUBAL LIGATION  11/10/2004      . DILATION AND CURETTAGE OF UTERUS  1997  . IR IMAGING GUIDED PORT INSERTION   04/04/2019  . OPERATIVE ULTRASOUND N/A 05/09/2019   Procedure: OPERATIVE ULTRASOUND;  Surgeon: Gery Pray, MD;  Location: [facility];  Service: Urology;  Laterality: N/A;  . OPERATIVE ULTRASOUND N/A 05/15/2019   Procedure: OPERATIVE ULTRASOUND;  Surgeon: Gery Pray, MD;  Location: [facility];  Service: Urology;  Laterality: N/A;  . OPERATIVE ULTRASOUND N/A 05/22/2019   Procedure: OPERATIVE ULTRASOUND;  Surgeon: Gery Pray, MD;  Location: [facility] [place] [facility];  Service: Urology;  Laterality: N/A;  . OPERATIVE ULTRASOUND N/A 05/29/2019   Procedure: OPERATIVE ULTRASOUND;  Surgeon: Gery Pray, MD;  Location: [facility];  Service: Urology;  Laterality: N/A;  . OPERATIVE ULTRASOUND N/A 06/05/2019   Procedure: OPERATIVE ULTRASOUND;  Surgeon: Gery Pray, MD;  Location: [facility];  Service: Urology;  Laterality: N/A;  . TANDEM RING INSERTION N/A 05/09/2019   Procedure: TANDEM RING INSERTION;  Surgeon: Gery Pray, MD;  Location: [facility];  Service: Urology;  Laterality: N/A;  . TANDEM RING INSERTION N/A 05/15/2019   Procedure: TANDEM RING INSERTION;  Surgeon: Gery Pray, MD;  Location: [facility];  Service: Urology;  Laterality: N/A;  . TANDEM RING  INSERTION N/A 05/22/2019   Procedure: TANDEM RING INSERTION;  Surgeon: Gery Pray, MD;  Location: [facility];  Service: Urology;  Laterality: N/A;  . TANDEM RING INSERTION N/A 05/29/2019   Procedure: TANDEM RING INSERTION;  Surgeon: Gery Pray, MD;  Location: [facility]  Service: Urology;  Laterality: N/A;  . TANDEM RING INSERTION N/A 06/05/2019   Procedure: TANDEM RING INSERTION;  Surgeon: Gery Pray, MD;  Location: [facility];  Service: Urology;  Laterality: N/A;    There were no vitals filed for this visit.   Subjective Assessment - 01/15/20 1107    Subjective I feel like my balance is  better. I am increasing my endurance with walking. Pain with dilators is getting better and on the medium size.    Pertinent History Cervical cancer; radiation 03/23/2019-06/05/2019    Patient Stated Goals improve urinary incontinence, help with use of dilators    Currently in Pain? Yes    Pain Score 5     Pain Location Vagina    Pain Orientation Lower    Pain Descriptors / Indicators Cramping;Sore    Pain Type Acute pain    Pain Onset More than a month ago    Pain Frequency Constant    Aggravating Factors  after using the dilator    Pain Relieving Factors not using the dilator              [facility] PT Assessment - 01/15/20 0001      Standardized Balance Assessment   Five times sit to stand comments  11.93 sec                      Pelvic Floor Special Questions - 01/15/20 0001    Pelvic Floor Internal Exam Patient confirmed identification and approves PT to assess pelvic floor and treatment    Exam Type Vaginal    Strength fair squeeze, definite lift             [facility] Adult PT Treatment/Exercise - 01/15/20 0001      Lumbar Exercises: Aerobic   Stationary Bike level 1 6 minutes      Lumbar Exercises: Standing   Other Standing Lumbar Exercises single leg stance 15 sec each leg 2x; tandem stance hold 30 sec 2 time each way      Manual Therapy   Manual Therapy Internal Pelvic Floor    Internal Pelvic Floor along the ring of tissue deep in the vaginal canal; alon gthe urethra sphincter and bulbocavernosus                  PT Education - 01/15/20 1143    Education Details Access Code: [email or web address]    Person(s) Educated Patient    Methods Explanation;Demonstration;Verbal cues    Comprehension Returned demonstration;Verbalized understanding            PT Short Term Goals - 01/11/20 0948      PT SHORT TERM GOAL #1   Title independent with initial HEP    Time 4    Period Weeks    Status Achieved      PT SHORT TERM GOAL #2   Title educated on vaginal  moisturizers to improve tissue integrity    Baseline educated    Time 4    Period Weeks    Status Achieved      PT SHORT TERM GOAL #3   Title educated on ways to use dilators to not increase abdominal pressure  so they will stay in the vaginal canal    Baseline educated    Time 4    Period Weeks    Status Achieved             PT Long Term Goals - 01/15/20 1114      PT LONG TERM GOAL #5   Title sit to stand </= 12 seconds due to improved strength and balance to reduce her risk of falls    Baseline 11.93    Time 10    Period Weeks    Status Achieved                 Plan - 01/15/20 1143    Clinical Impression Statement Patient sit to stand went from 22 seconds to 11.93 seconds. Pelvic floor strength has increased from 2/5 to 3/5. Patient is able to make a circular contraction. She has less of a ring of tissue deeper in the vaginal canal. Patient is now on the medium size dilator and will ask the cancer center for the bigger sizes. Patient is able to walk further without taking a rest. Patient is doing well with using cocnut oil for the vaginal dryness. Patient will benefit from skilled therapy to improve mobility, increase tissue elongation, increase endurance and balance and increase strength to reduce urinary leakage.    Personal Factors and Comorbidities Age;Fitness;Comorbidity 3+;Sex;Time since onset of injury/illness/exacerbation    Comorbidities Cervical cancer 03/18/2019 with radiation 03/23/2019-06/05/2019; Diabetes; C-section 11/11/2014; nueropathy in toes    Examination-Activity Limitations Locomotion Level;Bed Mobility;Squat;Continence;Stairs;Stand;Lift;Toileting    Examination-Participation Restrictions Meal Prep;Cleaning;Community Activity;Interpersonal Relationship;Laundry    Stability/Clinical Decision Making Evolving/Moderate complexity    Rehab Potential Good    PT Frequency 2x / week    PT Duration Other (comment)   10 weeks   PT Treatment/Interventions  ADLs/Self Care Home Management;Biofeedback;Functional mobility training;Therapeutic activities;Therapeutic exercise;Balance training;Neuromuscular re-education;Manual techniques;Patient/family education;Dry needling;Energy conservation;Passive range of motion;Spinal Manipulations;Joint Manipulations    PT Next Visit Plan test hip strength, work on cross body abdominal contraction and pelvic floor contraction    PT Home Exercise Plan Access Code: [email or web address]    Consulted and Agree with Plan of Care Patient           Patient will benefit from skilled therapeutic intervention in order to improve the following deficits and impairments:  Decreased coordination, Decreased range of motion, Difficulty walking, Increased fascial restricitons, Decreased endurance, Increased muscle spasms, Pain, Decreased activity tolerance, Decreased balance, Impaired flexibility, Decreased strength, Decreased mobility  Visit Diagnosis: Muscle weakness (generalized)  Cramp and spasm  Other abnormalities of gait and mobility  Other urinary incontinence     Problem List Patient Active Problem List   Diagnosis Date Noted  . Mixed hyperlipidemia 01/10/2020  . Vitamin D deficiency 01/10/2020  . Insulin resistance 01/10/2020  . Class 3 severe obesity with serious comorbidity and body mass index (BMI) of 40.0 to 44.9 in adult [facility]) 01/10/2020  . Obesity, Class II, BMI 35-39.9 09/07/2019  . Dehydration 05/30/2019  . Peripheral neuropathy due to chemotherapy ([place]) 04/25/2019  . Diarrhea 04/04/2019  . CKD (chronic kidney disease), symptom management only, stage 3 (moderate) ([place]) 03/30/2019  . Generalized anxiety disorder 03/30/2019  . Essential hypertension   . Anxiety 03/23/2019  . Squamous cell carcinoma of cervix ([place]) 03/23/2019  . Deficiency anemia 03/22/2019  . Squamous cell carcinoma in situ (SCCIS) of cervix 03/22/2019    Earlie Counts, PT 01/15/20 11:48 AM   [facility] [address]  [address], [place], [place], [address] Phone: [phone]   Fax:  [phone]  Name: Tamara Osborne MRN: [id number] Date of Birth: May 30, 1975

## 2020-01-15 NOTE — Patient Instructions (Signed)
Access Code: [email or web address] URL: [email or web address] Date: 01/15/2020 Prepared by: Earlie Counts  Exercises Seated Piriformis Stretch with Trunk Bend - 1 x daily - 7 x weekly - 1 sets - 2 reps - 30 sec hold Seated Hamstring Stretch - 1 x daily - 7 x weekly - 1 sets - 2 reps - 30 sec hold Seated Hip Adductor Stretch - 1 x daily - 7 x weekly - 1 sets - 2 reps - 30 sec hold Sit to Stand - 1 x daily - 7 x weekly - 1 sets - 10 reps Standing Single Leg Stance with Counter Support - 1 x daily - 7 x weekly - 1 sets - 3 reps - 15 sec hold Tandem Stance with Support - 1 x daily - 7 x weekly - 1 sets - 2 reps - 30 sec hold [facility] [address] [place], [place] [address] Phone # [phone] Fax [phone]

## 2020-01-18 ENCOUNTER — Telehealth: Payer: Self-pay | Admitting: Oncology

## 2020-01-18 ENCOUNTER — Telehealth (INDEPENDENT_AMBULATORY_CARE_PROVIDER_SITE_OTHER): Payer: Self-pay

## 2020-01-18 DIAGNOSIS — C539 Malignant neoplasm of cervix uteri, unspecified: Secondary | ICD-10-CM

## 2020-01-18 DIAGNOSIS — E669 Obesity, unspecified: Secondary | ICD-10-CM

## 2020-01-18 NOTE — Addendum Note (Signed)
Addended by: Lebron Conners on: 01/18/2020 12:08 PM   Modules accepted: Orders

## 2020-01-18 NOTE — Telephone Encounter (Signed)
Patient states the Cancer [facility], Dr. Alvy Bimler needs her lab orders sent over today.  Patient states this was suppose to be done at her last visit with Valetta Fuller but has been told they don't have them.  Thank you

## 2020-01-18 NOTE — Telephone Encounter (Signed)
Tamara Osborne called and said her health weight management doctor needs labs drawn and is wondering if she can have them done on 01/22/20 when she is here for her port flush.  Advised her to have what is needed faxed to [place].

## 2020-01-18 NOTE — Telephone Encounter (Signed)
I am not sure how the labs got canceled but I have re-entered the orders and notified the pt that the orders were placed. She will call back if there is still an issue.

## 2020-01-22 ENCOUNTER — Inpatient Hospital Stay: Payer: Medicaid Other | Attending: Hematology and Oncology

## 2020-01-22 ENCOUNTER — Inpatient Hospital Stay: Payer: Medicaid Other

## 2020-01-22 ENCOUNTER — Other Ambulatory Visit: Payer: Self-pay

## 2020-01-22 DIAGNOSIS — E669 Obesity, unspecified: Secondary | ICD-10-CM

## 2020-01-22 DIAGNOSIS — Z95828 Presence of other vascular implants and grafts: Secondary | ICD-10-CM

## 2020-01-22 DIAGNOSIS — C539 Malignant neoplasm of cervix uteri, unspecified: Secondary | ICD-10-CM

## 2020-01-22 DIAGNOSIS — E559 Vitamin D deficiency, unspecified: Secondary | ICD-10-CM | POA: Diagnosis not present

## 2020-01-22 DIAGNOSIS — N183 Chronic kidney disease, stage 3 unspecified: Secondary | ICD-10-CM | POA: Diagnosis not present

## 2020-01-22 LAB — CMP (CANCER CENTER ONLY)
ALT: 28 U/L (ref 0–44)
AST: 17 U/L (ref 15–41)
Albumin: 3.6 g/dL (ref 3.5–5.0)
Alkaline Phosphatase: 96 U/L (ref 38–126)
Anion gap: 8 (ref 5–15)
BUN: 19 mg/dL (ref 6–20)
CO2: 23 mmol/L (ref 22–32)
Calcium: 9.1 mg/dL (ref 8.9–10.3)
Chloride: 106 mmol/L (ref 98–111)
Creatinine: 1.49 mg/dL — ABNORMAL HIGH (ref 0.44–1.00)
GFR, Estimated: 44 mL/min — ABNORMAL LOW (ref >60)
Glucose, Bld: 131 mg/dL — ABNORMAL HIGH (ref 70–99)
Potassium: 3.9 mmol/L (ref 3.5–5.1)
Sodium: 137 mmol/L (ref 135–145)
Total Bilirubin: 0.4 mg/dL (ref 0.3–1.2)
Total Protein: 7.5 g/dL (ref 6.5–8.1)

## 2020-01-22 LAB — LIPID PANEL
Cholesterol: 191 mg/dL (ref 0–200)
HDL: 31 mg/dL — ABNORMAL LOW (ref >40)
LDL Cholesterol: 138 mg/dL — ABNORMAL HIGH (ref 0–99)
Total CHOL/HDL Ratio: 6.2 RATIO
Triglycerides: 111 mg/dL (ref <150)
VLDL: 22 mg/dL (ref 0–40)

## 2020-01-22 LAB — VITAMIN D 25 HYDROXY (VIT D DEFICIENCY, FRACTURES): Vit D, 25-Hydroxy: 69.37 ng/mL (ref 30–100)

## 2020-01-22 MED ORDER — HEPARIN SOD (PORK) LOCK FLUSH 100 UNIT/ML IV SOLN
500.0000 [IU] | Freq: Once | INTRAVENOUS | Status: AC
  Administered 2020-01-22: 500 [IU]
  Filled 2020-01-22: qty 5

## 2020-01-22 MED ORDER — SODIUM CHLORIDE 0.9% FLUSH
10.0000 mL | Freq: Once | INTRAVENOUS | Status: AC
  Administered 2020-01-22: 10 mL
  Filled 2020-01-22: qty 10

## 2020-01-23 ENCOUNTER — Telehealth: Payer: Self-pay

## 2020-01-23 LAB — INSULIN, RANDOM: Insulin: 143 u[IU]/mL — ABNORMAL HIGH (ref 2.6–24.9)

## 2020-01-23 NOTE — Telephone Encounter (Signed)
-----   Message from [name], MD sent at 01/23/2020  9:01 AM EST ----- Regarding: other labs All her requested labs are resulted ----- Message ----- From: Interface, Lab In [facility] [place] Sent: 01/22/2020  10:36 AM EST To: [name], MD

## 2020-01-23 NOTE — Telephone Encounter (Signed)
Called and given below message. She verbalized understanding. 

## 2020-01-29 ENCOUNTER — Other Ambulatory Visit: Payer: Self-pay

## 2020-01-29 ENCOUNTER — Encounter (INDEPENDENT_AMBULATORY_CARE_PROVIDER_SITE_OTHER): Payer: Self-pay | Admitting: Adult Health

## 2020-01-29 ENCOUNTER — Ambulatory Visit (INDEPENDENT_AMBULATORY_CARE_PROVIDER_SITE_OTHER): Payer: Medicaid Other | Admitting: Adult Health

## 2020-01-29 VITALS — BP 154/89 | HR 87 | Temp 97.6°F | Ht 61.0 in | Wt 213.0 lb

## 2020-01-29 DIAGNOSIS — I10 Essential (primary) hypertension: Secondary | ICD-10-CM | POA: Diagnosis not present

## 2020-01-29 DIAGNOSIS — E559 Vitamin D deficiency, unspecified: Secondary | ICD-10-CM | POA: Diagnosis not present

## 2020-01-29 DIAGNOSIS — E8881 Metabolic syndrome: Secondary | ICD-10-CM

## 2020-01-29 DIAGNOSIS — N183 Chronic kidney disease, stage 3 unspecified: Secondary | ICD-10-CM

## 2020-01-29 DIAGNOSIS — Z6841 Body Mass Index (BMI) 40.0 and over, adult: Secondary | ICD-10-CM

## 2020-01-29 MED ORDER — VICTOZA 18 MG/3ML ~~LOC~~ SOPN: 1.8000 mg | PEN_INJECTOR | Freq: Every day | SUBCUTANEOUS | 0 refills | Status: DC

## 2020-01-29 MED ORDER — BD PEN NEEDLE NANO 2ND GEN 32G X 4 MM MISC: 1.0000 | Freq: Two times a day (BID) | 0 refills | Status: DC

## 2020-01-29 NOTE — Progress Notes (Signed)
Chief Complaint:   OBESITY Tamara Osborne is here to discuss her progress with her obesity treatment plan along with follow-up of her obesity related diagnoses. Tamara Osborne is on the Category 1 Plan + 100 calories and states she is following her eating plan approximately 90% of the time. Tamara Osborne states she is doing PT 45 minutes 2 times per week.  Today's visit was #: 9 Starting weight: 208 lbs Starting date: 09/20/2019 Today's weight: 213 lbs Today's date: 01/29/2020 Total lbs lost to date: 0 Total lbs lost since last in-office visit: 0  Interim History: Tamara Osborne enjoyed Thanksgiving and stayed on track by focusing on [place] breast and a small serving of macaroni and cheese. Due to bi-weekly PT sessions, she reports drastically improved function and mobility of upper/lower extremities- wonderful!  Subjective:   Insulin resistance. Tamara Osborne has a diagnosis of insulin resistance based on her elevated fasting insulin level >5. She continues to work on diet and exercise to decrease her risk of diabetes. 01/22/2020 insulin level 143, a significant increase from 40.3 on 09/20/2019. Tamara Osborne is on liraglutide 1.8 mg daily and denies mass in neck, dysphagia, dyspepsia, or persistent hoarseness. Labs were discussed with the patient today.   Lab Results  Component Value Date   INSULIN 40.3 (H) 09/20/2019   Lab Results  Component Value Date   HGBA1C 5.2 10/30/2019   Essential hypertension. Blood pressure is above goal at today's office visit. Tamara Osborne is on amlodipine 10 mg daily and valsartan 160 mg daily. She reports taking antihypertensive medications prior to her office visit. Labs were discussed with the patient today.   BP Readings from Last 3 Encounters:  01/29/20 (!) 154/89  01/10/20 (!) 159/89  12/26/19 (!) 149/89   Lab Results  Component Value Date   CREATININE 1.49 (H) 01/22/2020   CREATININE 1.35 (H) 09/20/2019   CREATININE 1.49 (H) 08/07/2019   Vitamin D  deficiency. Vitamin D level on 01/22/2020 was 69.37 - at goal! Labs were discussed with the patient today.    Ref. Range 01/22/2020 10:02  Vitamin D, 25-Hydroxy Latest Ref Range: 30 - 100 ng/mL 69.37   CKD (chronic kidney disease), symptom management only, stage 3 (moderate) ([place]). GFR on 01/22/2020 was 44, which is stable for her. Labs were discussed with the patient today.   Assessment/Plan:   Insulin resistance. Tamara Osborne will continue to work on weight loss, exercise, and decreasing simple carbohydrates to help decrease the risk of diabetes. Tamara Osborne agreed to follow-up with [place] as directed to closely monitor her progress. Refills were given for Insulin Pen Needle (BD PEN NEEDLE NANO 2ND GEN) 32G X 4 MM MISC and liraglutide (VICTOZA) 18 MG/3ML SOPN 1.8 mg, dispense 1 mL, with 0 refills.  Essential hypertension. [name] is working on healthy weight loss and exercise to improve blood pressure control. We will watch for signs of hypotension as she continues her lifestyle modifications. She will continue her current antihypertensive therapy as directed.   Vitamin D deficiency. Low Vitamin D level contributes to fatigue and are associated with obesity, breast, and colon cancer. She will convert to OTC Vitamin D3 2,000 IU daily and will follow-up for routine testing of Vitamin D, at least 2-3 times per year to avoid over-replacement.  CKD (chronic kidney disease), symptom management only, stage 3 (moderate) ([place]). Tamara Osborne will control blood pressure and avoid NSAID use.  Class 3 severe obesity with serious comorbidity and body mass index (BMI) of 40.0 to 44.9 in adult, unspecified obesity type ([place]).  Tamara Osborne is currently in the action stage of change. As such, her goal is to continue with weight loss efforts. She has agreed to the Category 1 Plan + 100 calories.   Exercise goals: Tamara Osborne will continue PT 45 minutes 2 times per week.  Behavioral modification strategies: increasing lean protein  intake, no skipping meals, meal planning and cooking strategies, better snacking choices and planning for success.  Tamara Osborne has agreed to follow-up with our clinic in 2 weeks. She was informed of the importance of frequent follow-up visits to maximize her success with intensive lifestyle modifications for her multiple health conditions.   Objective:   Blood pressure (!) 154/89, pulse 87, temperature 97.6 F (36.4 C), height 5\' 1"  (1.549 m), weight 213 lb (96.6 kg), SpO2 98 %. Body mass index is 40.25 kg/m.  General: Cooperative, alert, well developed, in no acute distress. HEENT: Conjunctivae and lids unremarkable. Cardiovascular: Regular rhythm.  Lungs: Normal work of breathing. Neurologic: No focal deficits.   Lab Results  Component Value Date   CREATININE 1.49 (H) 01/22/2020   BUN 19 01/22/2020   NA 137 01/22/2020   K 3.9 01/22/2020   CL 106 01/22/2020   CO2 23 01/22/2020   Lab Results  Component Value Date   ALT 28 01/22/2020   AST 17 01/22/2020   ALKPHOS 96 01/22/2020   BILITOT 0.4 01/22/2020   Lab Results  Component Value Date   HGBA1C 5.2 10/30/2019   HGBA1C CANCELED 09/20/2019   Lab Results  Component Value Date   INSULIN 40.3 (H) 09/20/2019   Lab Results  Component Value Date   TSH 1.910 09/20/2019   Lab Results  Component Value Date   CHOL 191 01/22/2020   HDL 31 (L) 01/22/2020   LDLCALC 138 (H) 01/22/2020   TRIG 111 01/22/2020   CHOLHDL 6.2 01/22/2020   Lab Results  Component Value Date   WBC 6.6 08/07/2019   HGB 11.8 (L) 08/07/2019   HCT 34.8 (L) 08/07/2019   MCV 87.7 08/07/2019   PLT 267 08/07/2019   Lab Results  Component Value Date   IRON 17 (L) 03/27/2019   TIBC 283 03/27/2019   FERRITIN 32 03/27/2019   Attestation Statements:   Reviewed by clinician on day of visit: allergies, medications, problem list, medical history, surgical history, family history, social history, and previous encounter notes.  Time spent on visit  including pre-visit chart review and post-visit charting and care was 35 minutes.   I, Michaelene Song, am acting as Location manager for [employer], NP-C   I have reviewed the above documentation for accuracy and completeness, and I agree with the above. -  Jinx Gilden d. Nathaly Dawkins, NP-C

## 2020-02-01 ENCOUNTER — Encounter: Payer: Self-pay | Admitting: Physical Therapy

## 2020-02-01 ENCOUNTER — Ambulatory Visit: Payer: Medicaid Other | Attending: Radiation Oncology | Admitting: Physical Therapy

## 2020-02-01 ENCOUNTER — Other Ambulatory Visit: Payer: Self-pay

## 2020-02-01 DIAGNOSIS — R2689 Other abnormalities of gait and mobility: Secondary | ICD-10-CM | POA: Diagnosis present

## 2020-02-01 DIAGNOSIS — M6281 Muscle weakness (generalized): Secondary | ICD-10-CM | POA: Diagnosis present

## 2020-02-01 DIAGNOSIS — R252 Cramp and spasm: Secondary | ICD-10-CM | POA: Diagnosis present

## 2020-02-01 DIAGNOSIS — N39498 Other specified urinary incontinence: Secondary | ICD-10-CM | POA: Diagnosis present

## 2020-02-01 NOTE — Patient Instructions (Addendum)
Pelvic Floor Vaginal dilators                                                             Amielle Restore Vaginal Dilator Kit   Amazon $49                                      Vulva Tech, amazon.com $49                                                                              Restore                                                                                                                   Soul Source                                                                                    Intimate Rose       Large dilator $99.9 [email or web address]                                                                                  Inspire Silicone Dilator Set                                                                                 V Well  dilator set  Milli Dilator that you pump                                                                               Berman dilator                                                                                             [place] dilators                                                                  Vaginismus Vaginal dilators                                                    Oh Nut for deep vaginal penetration limitation  Most of these dilators you can get on [employer]. The ones you are not able to do then look at the company website or [email or web address]   [facility] [address] [place], [place] [address] Phone # [phone] Fax [phone]

## 2020-02-01 NOTE — Therapy (Signed)
[facility] [address], [place], [place], [address] Phone: [phone]   Fax:  [phone]  Physical Therapy Treatment  Patient Details  Name: Tamara Osborne MRN: [id number] Date of Birth: [birth year] Referring Provider (PT): Dr. Gery Pray   Encounter Date: 02/01/2020   PT End of Session - 02/01/20 1227    Visit Number 6    Date for PT Re-Evaluation 03/01/20    Authorization Type [employer]    Authorization Time Period 10/22-12/31    Authorization - Visit Number 6    Authorization - Number of Visits 12    PT Start Time 0263    PT Stop Time 1227    PT Time Calculation (min) 42 min    Activity Tolerance Patient tolerated treatment well;No increased pain    Behavior During Therapy WFL for tasks assessed/performed           Past Medical History:  Diagnosis Date   Anemia    Anxiety    Bartholin cyst    Cervical cancer ([place])    Constipation    Depression    Deviated septum    Diabetes mellitus without complication ([place])    Difficult intravenous access    used port for 05-09-2019 surgery   Fibroids    History of blood transfusion    2 units given 04-26-2019, has had total of 8 units since jan 2021   History of blood transfusion 1 unit given 05-30-19   iv fluids also given   History of kidney problems    History of recent blood transfusion 05/16/2019   2 units given  per dr Pollyann Savoy at cancer center   Hypertension    Neuropathy    both thumbs   Obesity    Palpitations    PONV (postoperative nausea and vomiting)    Prediabetes    Preeclampsia 2006   Sleep apnea    no cpap used insurance would not cover, osa severe per pt   SOB (shortness of breath)    Swallowing difficulty     Past Surgical History:  Procedure Laterality Date   CESAREAN SECTION WITH BILATERAL TUBAL LIGATION  11/10/2004       DILATION AND CURETTAGE OF UTERUS  1997   IR IMAGING GUIDED PORT INSERTION   04/04/2019   OPERATIVE ULTRASOUND N/A 05/09/2019   Procedure: OPERATIVE ULTRASOUND;  Surgeon: Gery Pray, MD;  Location: [facility];  Service: Urology;  Laterality: N/A;   OPERATIVE ULTRASOUND N/A 05/15/2019   Procedure: OPERATIVE ULTRASOUND;  Surgeon: Gery Pray, MD;  Location: [facility];  Service: Urology;  Laterality: N/A;   OPERATIVE ULTRASOUND N/A 05/22/2019   Procedure: OPERATIVE ULTRASOUND;  Surgeon: Gery Pray, MD;  Location: [facility];  Service: Urology;  Laterality: N/A;   OPERATIVE ULTRASOUND N/A 05/29/2019   Procedure: OPERATIVE ULTRASOUND;  Surgeon: Gery Pray, MD;  Location: [facility];  Service: Urology;  Laterality: N/A;   OPERATIVE ULTRASOUND N/A 06/05/2019   Procedure: OPERATIVE ULTRASOUND;  Surgeon: Gery Pray, MD;  Location: [facility];  Service: Urology;  Laterality: N/A;   TANDEM RING INSERTION N/A 05/09/2019   Procedure: TANDEM RING INSERTION;  Surgeon: Gery Pray, MD;  Location: [facility];  Service: Urology;  Laterality: N/A;   TANDEM RING INSERTION N/A 05/15/2019   Procedure: TANDEM RING INSERTION;  Surgeon: Gery Pray, MD;  Location[facility];  Service: Urology;  Laterality: N/A;   TANDEM RING  INSERTION N/A 05/22/2019   Procedure: TANDEM RING INSERTION;  Surgeon: Gery Pray, MD;  Location: [facility];  Service: Urology;  Laterality: N/A;   TANDEM RING INSERTION N/A 05/29/2019   Procedure: TANDEM RING INSERTION;  Surgeon: Gery Pray, MD;  [facility]  Service: Urology;  Laterality: N/A;   TANDEM RING INSERTION N/A 06/05/2019   Procedure: TANDEM RING INSERTION;  Surgeon: Gery Pray, MD;  Location: [facility];  Service: Urology;  Laterality: N/A;    There were no vitals filed for this visit.   Subjective Assessment - 02/01/20 1148    Subjective The urinary incontinence is  getting better. When have to go to the bathroom I do not have to run as much. I had intercourse 1 day and it was painful.    Pertinent History Cervical cancer; radiation 03/23/2019-06/05/2019    Patient Stated Goals improve urinary incontinence, help with use of dilators    Currently in Pain? Yes    Pain Score 4     Pain Location Vagina    Pain Orientation Lower    Pain Descriptors / Indicators Sore;Cramping    Pain Type Acute pain    Pain Onset More than a month ago    Pain Frequency Constant    Aggravating Factors  penile penetration, after dilator    Pain Relieving Factors not using the dilator    Multiple Pain Sites No                          Pelvic Floor Special Questions - 02/01/20 0001    Pelvic Floor Internal Exam Patient confirmed identification and approves PT to assess pelvic floor and treatment    Exam Type Vaginal    Palpation no ring like tissue felt today    Strength fair squeeze, definite lift             [facility] Adult PT Treatment/Exercise - 02/01/20 0001      Self-Care   Self-Care Other Self-Care Comments    Other Self-Care Comments  education on purchasing larger dilators to use vaginally and the different ones she can look at      [facility] Internal Pelvic Floor    Internal Pelvic Floor levator ani, obturator internist, along the sides of the bladder and urethra sphincter in left sidely                  PT Education - 02/01/20 1225    Education Details education on dilators    Person(s) Educated Patient    Methods Explanation;Handout    Comprehension Verbalized understanding            PT Short Term Goals - 01/11/20 0948      PT SHORT TERM GOAL #1   Title independent with initial HEP    Time 4    Period Weeks    Status Achieved      PT SHORT TERM GOAL #2   Title educated on vaginal moisturizers to improve tissue integrity    Baseline educated    Time 4    Period Weeks    Status Achieved       PT SHORT TERM GOAL #3   Title educated on ways to use dilators to not increase abdominal pressure so they will stay in the vaginal canal    Baseline educated    Time 4    Period Weeks  Status Achieved             PT Long Term Goals - 02/01/20 1234      PT LONG TERM GOAL #1   Title independent with advanced exercises for strength, flexibility, and endurance    Time 10    Period Weeks    Status On-going      PT LONG TERM GOAL #2   Title able to use the largest dilator with no pain so she is able to having penile penetration with minimal to no difficulty    Baseline purchasing larger dilators    Time 10    Period Weeks    Status On-going      PT LONG TERM GOAL #3   Title urinary leakage decreased >/= 75% and down to 0-1 thin pad due to increased pelvic floor strength and increased tissue mobility    Baseline uses 2 thick pads; pelvic floor strength 3/5    Time 10    Period Weeks    Status On-going      PT LONG TERM GOAL #4   Title able to walk for 20 minutes in the store and for exercise without having to go to the bathroom due to increased endrance and pelvic floor strength    Baseline walk 300 feet then has to go to the bathroom    Time 10    Period Weeks    Status On-going                 Plan - 02/01/20 1229    Clinical Impression Statement Patient is on the last dialtor and educated on where to purchase bigger sizes. Patient had penile penetration vaginally but increased pain and had to stop. Therapist did not feel a ring in the vaginal canal due to improved tissue mobility. Patient pelvic floor strength is 3/5. Patient reports reduction in urinary leakage. Patient vaginal tissue looks healthy and the coconut oil is helping. Patient will benefit from skilled therapy to improve mobility, increase tissue elongation, increase endurance and balance and increase strength to reduce urinary leakage.    Personal Factors and Comorbidities Age;Fitness;Comorbidity  3+;Sex;Time since onset of injury/illness/exacerbation    Comorbidities Cervical cancer 03/18/2019 with radiation 03/23/2019-06/05/2019; Diabetes; C-section 11/11/2014; nueropathy in toes    Examination-Activity Limitations Locomotion Level;Bed Mobility;Squat;Continence;Stairs;Stand;Lift;Toileting    Examination-Participation Restrictions Meal Prep;Cleaning;Community Activity;Interpersonal Relationship;Laundry    Stability/Clinical Decision Making Evolving/Moderate complexity    Rehab Potential Good    PT Frequency 2x / week    PT Duration Other (comment)   10 weeks   PT Treatment/Interventions ADLs/Self Care Home Management;Biofeedback;Functional mobility training;Therapeutic activities;Therapeutic exercise;Balance training;Neuromuscular re-education;Manual techniques;Patient/family education;Dry needling;Energy conservation;Passive range of motion;Spinal Manipulations;Joint Manipulations    PT Next Visit Plan test hip strength, work on cross body abdominal contraction and pelvic floor contraction; internal work, see if she has gotten the dilators    PT Home Exercise Plan Access Code: [email or web address]    Consulted and Agree with Plan of Care Patient           Patient will benefit from skilled therapeutic intervention in order to improve the following deficits and impairments:  Decreased coordination, Decreased range of motion, Difficulty walking, Increased fascial restricitons, Decreased endurance, Increased muscle spasms, Pain, Decreased activity tolerance, Decreased balance, Impaired flexibility, Decreased strength, Decreased mobility  Visit Diagnosis: Muscle weakness (generalized)  Cramp and spasm  Other abnormalities of gait and mobility  Other urinary incontinence     Problem List Patient Active Problem List   Diagnosis  Date Noted   Port-A-Cath in place 01/22/2020   Mixed hyperlipidemia 01/10/2020   Vitamin D deficiency 01/10/2020   Insulin resistance 01/10/2020   Class 3 severe  obesity with serious comorbidity and body mass index (BMI) of 40.0 to 44.9 in adult ([place]) 01/10/2020   Obesity, Class II, BMI 35-39.9 09/07/2019   Dehydration 05/30/2019   Peripheral neuropathy due to chemotherapy ([place]) 04/25/2019   Diarrhea 04/04/2019   CKD (chronic kidney disease), symptom management only, stage 3 (moderate) ([place]) 03/30/2019   Generalized anxiety disorder 03/30/2019   Essential hypertension    Anxiety 03/23/2019   Squamous cell carcinoma of cervix ([place]) 03/23/2019   Deficiency anemia 03/22/2019   Squamous cell carcinoma in situ (SCCIS) of cervix 03/22/2019    Earlie Counts, PT 02/01/20 12:35 PM   [facility] [address], [place], [place], [address] Phone: [phone]   Fax:  [phone]  Name: Tamara Osborne MRN: [id number] Date of Birth: [birth year]

## 2020-02-07 ENCOUNTER — Encounter: Payer: Self-pay | Admitting: Physical Therapy

## 2020-02-07 ENCOUNTER — Ambulatory Visit: Payer: Medicaid Other | Admitting: Physical Therapy

## 2020-02-07 ENCOUNTER — Other Ambulatory Visit: Payer: Self-pay

## 2020-02-07 DIAGNOSIS — N39498 Other specified urinary incontinence: Secondary | ICD-10-CM

## 2020-02-07 DIAGNOSIS — R252 Cramp and spasm: Secondary | ICD-10-CM

## 2020-02-07 DIAGNOSIS — M6281 Muscle weakness (generalized): Secondary | ICD-10-CM

## 2020-02-07 DIAGNOSIS — R2689 Other abnormalities of gait and mobility: Secondary | ICD-10-CM

## 2020-02-07 NOTE — Therapy (Signed)
[facility] [address], [place], [place], [address] Phone: [phone]   Fax:  [phone]  Physical Therapy Treatment  Patient Details  Name: Tamara Osborne MRN: [id number] Date of Birth: November 26, 1975 Referring Provider (PT): Dr. Gery Pray   Encounter Date: 02/07/2020   PT End of Session - 02/07/20 1227    Visit Number 7    Date for PT Re-Evaluation 03/01/20    Authorization Type [employer]    Authorization Time Period 10/22-12/31    Authorization - Visit Number 7    Authorization - Number of Visits 12    PT Start Time 2395    PT Stop Time 1227    PT Time Calculation (min) 42 min    Activity Tolerance Patient tolerated treatment well;No increased pain    Behavior During Therapy WFL for tasks assessed/performed           Past Medical History:  Diagnosis Date  . Anemia   . Anxiety   . Bartholin cyst   . Cervical cancer ([place])   . Constipation   . Depression   . Deviated septum   . Diabetes mellitus without complication ([place])   . Difficult intravenous access    used port for 05-09-2019 surgery  . Fibroids   . History of blood transfusion    2 units given 04-26-2019, has had total of 8 units since jan 2021  . History of blood transfusion 1 unit given 05-30-19   iv fluids also given  . History of kidney problems   . History of recent blood transfusion 05/16/2019   2 units given  per dr Pollyann Savoy at cancer center  . Hypertension   . Neuropathy    both thumbs  . Obesity   . Palpitations   . PONV (postoperative nausea and vomiting)   . Prediabetes   . Preeclampsia 2006  . Sleep apnea    no cpap used insurance would not cover, osa severe per pt  . SOB (shortness of breath)   . Swallowing difficulty     Past Surgical History:  Procedure Laterality Date  . CESAREAN SECTION WITH BILATERAL TUBAL LIGATION  11/10/2004      . DILATION AND CURETTAGE OF UTERUS  1997  . IR IMAGING GUIDED PORT INSERTION   04/04/2019  . OPERATIVE ULTRASOUND N/A 05/09/2019   Procedure: OPERATIVE ULTRASOUND;  Surgeon: Gery Pray, MD;  Location: [facility];  Service: Urology;  Laterality: N/A;  . OPERATIVE ULTRASOUND N/A 05/15/2019   Procedure: OPERATIVE ULTRASOUND;  Surgeon: Gery Pray, MD;  Location: [facility];  Service: Urology;  Laterality: N/A;  . OPERATIVE ULTRASOUND N/A 05/22/2019   Procedure: OPERATIVE ULTRASOUND;  Surgeon: Gery Pray, MD;  Location: [facility];  Service: Urology;  Laterality: N/A;  . OPERATIVE ULTRASOUND N/A 05/29/2019   Procedure: OPERATIVE ULTRASOUND;  Surgeon: Gery Pray, MD;  Location: [facility];  Service: Urology;  Laterality: N/A;  . OPERATIVE ULTRASOUND N/A 06/05/2019   Procedure: OPERATIVE ULTRASOUND;  Surgeon: Gery Pray, MD;  Location: [facility];  Service: Urology;  Laterality: N/A;  . TANDEM RING INSERTION N/A 05/09/2019   Procedure: TANDEM RING INSERTION;  Surgeon: Gery Pray, MD;  Location: [facility];  Service: Urology;  Laterality: N/A;  . TANDEM RING INSERTION N/A 05/15/2019   Procedure: TANDEM RING INSERTION;  Surgeon: Gery Pray, MD;  Location: [facility];  Service: Urology;  Laterality: N/A;  . TANDEM RING  INSERTION N/A 05/22/2019   Procedure: TANDEM RING INSERTION;  Surgeon: Gery Pray, MD;  Location: [facility];  Service: Urology;  Laterality: N/A;  . TANDEM RING INSERTION N/A 05/29/2019   Procedure: TANDEM RING INSERTION;  Surgeon: Gery Pray, MD;  Location: [facility];  Service: Urology;  Laterality: N/A;  . TANDEM RING INSERTION N/A 06/05/2019   Procedure: TANDEM RING INSERTION;  Surgeon: Gery Pray, MD;  Location: [facility];  Service: Urology;  Laterality: N/A;    There were no vitals filed for this visit.   Subjective Assessment - 02/07/20 1153    Subjective I am having increased pain  in my left foot. I have not ordered the dilators yet due to finances.    Pertinent History Cervical cancer; radiation 03/23/2019-06/05/2019    Patient Stated Goals improve urinary incontinence, help with use of dilators    Currently in Pain? Yes    Pain Score 6     Pain Location Foot    Pain Orientation Right    Pain Descriptors / Indicators Numbness;Grimacing;Tender    Pain Type Acute pain    Pain Onset Today    Pain Frequency Constant    Aggravating Factors  weight on right foot    Pain Relieving Factors no weight on right foot    Multiple Pain Sites No              [facility] PT Assessment - 02/07/20 0001      Assessment   Medical Diagnosis C53.9 Squamous cell carcinoma of cervix    Referring Provider (PT) Dr. Gery Pray    Onset Date/Surgical Date 03/18/19      Strength   Right Hip Flexion 4/5    Right Hip Extension 4/5    Right Hip External Rotation  4/5    Right Hip ABduction 4+/5    Left Hip Flexion 5/5    Left Hip Extension 4/5    Left Hip External Rotation 4/5    Left Hip ABduction 4/5                         [facility] Adult PT Treatment/Exercise - 02/07/20 0001      Lumbar Exercises: Stretches   Active Hamstring Stretch Right;Left;1 rep;30 seconds    Active Hamstring Stretch Limitations supine with strap    ITB Stretch Right;Left;1 rep;30 seconds    ITB Stretch Limitations supine with strap    Piriformis Stretch Right;Left;1 rep;60 seconds    Piriformis Stretch Limitations with therapist helping    Other Lumbar Stretch Exercise right, left, 1 rep, 30 sec with strap      Lumbar Exercises: Aerobic   Nustep level 2 for 7 minutes while asessing patient for progress      Manual Therapy   Manual Therapy Soft tissue mobilization    Manual therapy comments reviewed with patient on how to perfrom abdominal massage    Soft tissue mobilization circular abdominal massage to reduce the hardness of the abdomen                   PT Education -  02/07/20 1227    Education Details reviewed with patient on abdominal massage    Methods Explanation;Demonstration;Verbal cues    Comprehension Verbalized understanding;Returned demonstration            PT Short Term Goals - 01/11/20 0948      PT SHORT TERM GOAL #1   Title independent with  initial HEP    Time 4    Period Weeks    Status Achieved      PT SHORT TERM GOAL #2   Title educated on vaginal moisturizers to improve tissue integrity    Baseline educated    Time 4    Period Weeks    Status Achieved      PT SHORT TERM GOAL #3   Title educated on ways to use dilators to not increase abdominal pressure so they will stay in the vaginal canal    Baseline educated    Time 4    Period Weeks    Status Achieved             PT Long Term Goals - 02/07/20 1233      PT LONG TERM GOAL #2   Title able to use the largest dilator with no pain so she is able to having penile penetration with minimal to no difficulty    Baseline purchasing larger dilators    Time 10    Period Weeks    Status On-going      PT LONG TERM GOAL #3   Title urinary leakage decreased >/= 75% and down to 0-1 thin pad due to increased pelvic floor strength and increased tissue mobility    Baseline 60% better    Time 10    Period Weeks    Status On-going                 Plan - 02/07/20 1227    Clinical Impression Statement Patient reports her overall urinary leakage decreased by 60% since initial eval. Patient abdomen stays very firm increasing pressure on the pelvic floor. Patient was edcated again on the manaual owrk to abdomen to  assist in reduction of the swelling. Patient has pain in right foot making it difficult to do exercises with weightbearing on the right foot. Patient did not seem to want intermal manual work today. Patient will orer the bigger dilators when she is able to finacially. Patient has not met goals today. Patient will benefit from skilled therapy to improve mobility,  increase tissue elongation, increase endurance and balance and increase strength to reduce urinary leakage.    Personal Factors and Comorbidities Age;Fitness;Comorbidity 3+;Sex;Time since onset of injury/illness/exacerbation    Comorbidities Cervical cancer 03/18/2019 with radiation 03/23/2019-06/05/2019; Diabetes; C-section 11/11/2014; nueropathy in toes    Examination-Activity Limitations Locomotion Level;Bed Mobility;Squat;Continence;Stairs;Stand;Lift;Toileting    Examination-Participation Restrictions Meal Prep;Cleaning;Community Activity;Interpersonal Relationship;Laundry    Stability/Clinical Decision Making Evolving/Moderate complexity    Rehab Potential Good    PT Frequency 2x / week    PT Duration Other (comment)   10 weeks   PT Treatment/Interventions ADLs/Self Care Home Management;Biofeedback;Functional mobility training;Therapeutic activities;Therapeutic exercise;Balance training;Neuromuscular re-education;Manual techniques;Patient/family education;Dry needling;Energy conservation;Passive range of motion;Spinal Manipulations;Joint Manipulations    PT Next Visit Plan work on cross body abdominal contraction and pelvic floor contraction; internal work, see if she has gotten the dilators    PT Home Exercise Plan Access Code: [email or web address]    Consulted and Agree with Plan of Care Patient           Patient will benefit from skilled therapeutic intervention in order to improve the following deficits and impairments:  Decreased coordination, Decreased range of motion, Difficulty walking, Increased fascial restricitons, Decreased endurance, Increased muscle spasms, Pain, Decreased activity tolerance, Decreased balance, Impaired flexibility, Decreased strength, Decreased mobility  Visit Diagnosis: Muscle weakness (generalized)  Cramp and spasm  Other abnormalities of gait and mobility  Other urinary incontinence     Problem List Patient Active Problem List   Diagnosis Date Noted  .  Port-A-Cath in place 01/22/2020  . Mixed hyperlipidemia 01/10/2020  . Vitamin D deficiency 01/10/2020  . Insulin resistance 01/10/2020  . Class 3 severe obesity with serious comorbidity and body mass index (BMI) of 40.0 to 44.9 in adult [facility]) 01/10/2020  . Obesity, Class II, BMI 35-39.9 09/07/2019  . Dehydration 05/30/2019  . Peripheral neuropathy due to chemotherapy ([place]) 04/25/2019  . Diarrhea 04/04/2019  . CKD (chronic kidney disease), symptom management only, stage 3 (moderate) ([place]) 03/30/2019  . Generalized anxiety disorder 03/30/2019  . Essential hypertension   . Anxiety 03/23/2019  . Squamous cell carcinoma of cervix ([place]) 03/23/2019  . Deficiency anemia 03/22/2019  . Squamous cell carcinoma in situ (SCCIS) of cervix 03/22/2019    Earlie Counts, PT 02/07/20 12:34 PM   [facility] [address], [place], [place], [address] Phone: [phone]   Fax:  [phone]  Name: Tamara Osborne MRN: [id number] Date of Birth: 1975-08-28

## 2020-02-09 ENCOUNTER — Encounter: Payer: Medicaid Other | Admitting: Physical Therapy

## 2020-02-12 ENCOUNTER — Ambulatory Visit (INDEPENDENT_AMBULATORY_CARE_PROVIDER_SITE_OTHER): Payer: Medicaid Other | Admitting: Adult Health

## 2020-02-14 ENCOUNTER — Encounter (INDEPENDENT_AMBULATORY_CARE_PROVIDER_SITE_OTHER): Payer: Self-pay | Admitting: Adult Health

## 2020-02-14 ENCOUNTER — Other Ambulatory Visit: Payer: Self-pay

## 2020-02-14 ENCOUNTER — Ambulatory Visit (INDEPENDENT_AMBULATORY_CARE_PROVIDER_SITE_OTHER): Payer: Medicaid Other | Admitting: Adult Health

## 2020-02-14 VITALS — BP 128/78 | HR 98 | Temp 98.0°F | Ht 61.0 in | Wt 213.0 lb

## 2020-02-14 DIAGNOSIS — E559 Vitamin D deficiency, unspecified: Secondary | ICD-10-CM

## 2020-02-14 DIAGNOSIS — I10 Essential (primary) hypertension: Secondary | ICD-10-CM | POA: Diagnosis not present

## 2020-02-14 DIAGNOSIS — Z6841 Body Mass Index (BMI) 40.0 and over, adult: Secondary | ICD-10-CM

## 2020-02-14 DIAGNOSIS — N183 Chronic kidney disease, stage 3 unspecified: Secondary | ICD-10-CM

## 2020-02-14 DIAGNOSIS — E8881 Metabolic syndrome: Secondary | ICD-10-CM | POA: Diagnosis not present

## 2020-02-14 MED ORDER — VICTOZA 18 MG/3ML ~~LOC~~ SOPN: 1.8000 mg | PEN_INJECTOR | Freq: Every day | SUBCUTANEOUS | 0 refills | Status: DC

## 2020-02-14 NOTE — Progress Notes (Signed)
Chief Complaint:   OBESITY Tamara Osborne is here to discuss her progress with her obesity treatment plan along with follow-up of her obesity related diagnoses. Tamara Osborne is on the Category 1 Plan + 100 calories and states she is following her eating plan approximately 90% of the time. Tamara Osborne states she is doing PT 45 minutes 2 times per week.  Today's visit was #: 10 Starting weight: 208 lbs Starting date: 09/20/2019 Today's weight: 213 lbs Today's date: 02/14/2020 Total lbs lost to date: 0 Total lbs lost since last in-office visit: 0  Interim History: Tamara Osborne plans on only eating lean protein and vegetable dishes to celebrate the Christmas holiday. She has maintained her weight over the last 2 months! She had labs drawn at the [facility], but wasn't fasting at the time of the lab draw.  Subjective:   Insulin resistance. Tamara Osborne has a diagnosis of insulin resistance based on her elevated fasting insulin level >5. She continues to work on diet and exercise to decrease her risk of diabetes. Insulin level on 01/22/2020 was 143; previous insulin level on 09/20/2019 was 40.3. Tamara Osborne was NOT FASTING AT THE TIME OF LAB DRAW. She denies mass in neck, dysphagia, dyspepsia, or persistent hoarseness.   Lab Results  Component Value Date   INSULIN 40.3 (H) 09/20/2019   Lab Results  Component Value Date   HGBA1C 5.2 10/30/2019   Essential hypertension. Blood pressure and heart rate are stable on today's office visit. Tamara Osborne is on valsartan 160 mg daily and amlodipine 10 mg daily.  BP Readings from Last 3 Encounters:  02/14/20 128/78  01/29/20 (!) 154/89  01/10/20 (!) 159/89   Lab Results  Component Value Date   CREATININE 1.49 (H) 01/22/2020   CREATININE 1.35 (H) 09/20/2019   CREATININE 1.49 (H) 08/07/2019   CKD (chronic kidney disease), symptom management only, stage 3 (moderate) ([place]). CMP on 01/22/2020 showed GFR stable at 44. Tamara Osborne is on valsartan 160 mg  daily.  Vitamin D deficiency. Vitamin D level on 01/22/2020 was 69.37, at goal. Tamara Osborne is on OTC Vitamin D3 2,000 IU daily.   Ref. Range 01/22/2020 10:02  Vitamin D, 25-Hydroxy Latest Ref Range: 30 - 100 ng/mL 69.37   Assessment/Plan:   Insulin resistance. [name] will continue to work on weight loss, exercise, and decreasing simple carbohydrates to help decrease the risk of diabetes. [name] agreed to follow-up with [place] as directed to closely monitor her progress. Refill was given for liraglutide (VICTOZA) 18 MG/3ML SOPN 1.8 mg daily, dispense 7 mL with 0 refills.  Essential hypertension. [name] is working on healthy weight loss and exercise to improve blood pressure control. We will watch for signs of hypotension as she continues her lifestyle modifications. She will continue her ARB/CCB regimen as directed and will continue her bi-weekly PT sessions.  CKD (chronic kidney disease), symptom management only, stage 3 (moderate) ([place]). Tamara Osborne will continue her ARB therapy as directed.   Vitamin D deficiency. Low Vitamin D level contributes to fatigue and are associated with obesity, breast, and colon cancer. She agrees to continue to take OTC Vitamin D3 2,00 IU daily and will follow-up for routine testing of Vitamin D, at least 2-3 times per year to avoid over-replacement.  Class 3 severe obesity with serious comorbidity and body mass index (BMI) of 40.0 to 44.9 in adult, unspecified obesity type ([place]).  Tamara Osborne is currently in the action stage of change. As such, her goal is to continue with weight loss efforts. She has  agreed to the Category 1 Plan + 100 calories.  Handout was provided on Holiday Strategies.   Exercise goals: Tamara Osborne will continue PT 45 minutes 2 times per week.  Behavioral modification strategies: increasing lean protein intake, decreasing simple carbohydrates, meal planning and cooking strategies and planning for success.  Tamara Osborne has agreed to follow-up with  our clinic in 3 weeks. She was informed of the importance of frequent follow-up visits to maximize her success with intensive lifestyle modifications for her multiple health conditions.   Objective:   Blood pressure 128/78, pulse 98, temperature 98 F (36.7 C), height 5\' 1"  (1.549 m), weight 213 lb (96.6 kg), SpO2 98 %. Body mass index is 40.25 kg/m.  General: Cooperative, alert, well developed, in no acute distress. HEENT: Conjunctivae and lids unremarkable. Cardiovascular: Regular rhythm.  Lungs: Normal work of breathing. Neurologic: No focal deficits.   Lab Results  Component Value Date   CREATININE 1.49 (H) 01/22/2020   BUN 19 01/22/2020   NA 137 01/22/2020   K 3.9 01/22/2020   CL 106 01/22/2020   CO2 23 01/22/2020   Lab Results  Component Value Date   ALT 28 01/22/2020   AST 17 01/22/2020   ALKPHOS 96 01/22/2020   BILITOT 0.4 01/22/2020   Lab Results  Component Value Date   HGBA1C 5.2 10/30/2019   HGBA1C CANCELED 09/20/2019   Lab Results  Component Value Date   INSULIN 40.3 (H) 09/20/2019   Lab Results  Component Value Date   TSH 1.910 09/20/2019   Lab Results  Component Value Date   CHOL 191 01/22/2020   HDL 31 (L) 01/22/2020   LDLCALC 138 (H) 01/22/2020   TRIG 111 01/22/2020   CHOLHDL 6.2 01/22/2020   Lab Results  Component Value Date   WBC 6.6 08/07/2019   HGB 11.8 (L) 08/07/2019   HCT 34.8 (L) 08/07/2019   MCV 87.7 08/07/2019   PLT 267 08/07/2019   Lab Results  Component Value Date   IRON 17 (L) 03/27/2019   TIBC 283 03/27/2019   FERRITIN 32 03/27/2019   Attestation Statements:   Reviewed by clinician on day of visit: allergies, medications, problem list, medical history, surgical history, family history, social history, and previous encounter notes.  I, Michaelene Song, am acting as Location manager for [employer], NP-C   I have reviewed the above documentation for accuracy and completeness, and I agree with the above. -  Azarah Dacy d.  Zaylee Cornia, NP-C

## 2020-02-16 ENCOUNTER — Ambulatory Visit: Payer: Medicaid Other | Admitting: Physical Therapy

## 2020-02-16 ENCOUNTER — Other Ambulatory Visit: Payer: Self-pay

## 2020-02-16 ENCOUNTER — Encounter: Payer: Self-pay | Admitting: Physical Therapy

## 2020-02-16 DIAGNOSIS — R252 Cramp and spasm: Secondary | ICD-10-CM

## 2020-02-16 DIAGNOSIS — M6281 Muscle weakness (generalized): Secondary | ICD-10-CM | POA: Diagnosis not present

## 2020-02-16 DIAGNOSIS — R2689 Other abnormalities of gait and mobility: Secondary | ICD-10-CM

## 2020-02-16 DIAGNOSIS — N39498 Other specified urinary incontinence: Secondary | ICD-10-CM

## 2020-02-16 NOTE — Therapy (Signed)
[facility] [address] [place], [place], [address] Phone: [phone]   Fax:  [phone]  Physical Therapy Treatment  Patient Details  Name: Tamara Osborne MRN: [id number] Date of Birth: 12-Nov-1975 Referring Provider (PT): Dr. Gery Pray   Encounter Date: 02/16/2020   PT End of Session - 02/16/20 1111    Visit Number 8    Date for PT Re-Evaluation 03/01/20    Authorization Type [employer]    Authorization Time Period 10/22-12/31    Authorization - Visit Number 8    Authorization - Number of Visits 12    PT Start Time 1583    PT Stop Time 1146    PT Time Calculation (min) 38 min    Activity Tolerance Patient tolerated treatment well;No increased pain    Behavior During Therapy WFL for tasks assessed/performed           Past Medical History:  Diagnosis Date  . Anemia   . Anxiety   . Bartholin cyst   . Cervical cancer ([place])   . Constipation   . Depression   . Deviated septum   . Diabetes mellitus without complication ([place])   . Difficult intravenous access    used port for 05-09-2019 surgery  . Fibroids   . History of blood transfusion    2 units given 04-26-2019, has had total of 8 units since jan 2021  . History of blood transfusion 1 unit given 05-30-19   iv fluids also given  . History of kidney problems   . History of recent blood transfusion 05/16/2019   2 units given  per dr Pollyann Savoy at cancer center  . Hypertension   . Neuropathy    both thumbs  . Obesity   . Palpitations   . PONV (postoperative nausea and vomiting)   . Prediabetes   . Preeclampsia 2006  . Sleep apnea    no cpap used insurance would not cover, osa severe per pt  . SOB (shortness of breath)   . Swallowing difficulty     Past Surgical History:  Procedure Laterality Date  . CESAREAN SECTION WITH BILATERAL TUBAL LIGATION  11/10/2004      . DILATION AND CURETTAGE OF UTERUS  1997  . IR IMAGING GUIDED PORT INSERTION   04/04/2019  . OPERATIVE ULTRASOUND N/A 05/09/2019   Procedure: OPERATIVE ULTRASOUND;  Surgeon: Gery Pray, MD;  Location: [facility];  Service: Urology;  Laterality: N/A;  . OPERATIVE ULTRASOUND N/A 05/15/2019   Procedure: OPERATIVE ULTRASOUND;  Surgeon: Gery Pray, MD;  Location: [facility];  Service: Urology;  Laterality: N/A;  . OPERATIVE ULTRASOUND N/A 05/22/2019   Procedure: OPERATIVE ULTRASOUND;  Surgeon: Gery Pray, MD;  Location: [facility];  Service: Urology;  Laterality: N/A;  . OPERATIVE ULTRASOUND N/A 05/29/2019   Procedure: OPERATIVE ULTRASOUND;  Surgeon: Gery Pray, MD;  Location: [facility];  Service: Urology;  Laterality: N/A;  . OPERATIVE ULTRASOUND N/A 06/05/2019   Procedure: OPERATIVE ULTRASOUND;  Surgeon: Gery Pray, MD;  Location: [facility];  Service: Urology;  Laterality: N/A;  . TANDEM RING INSERTION N/A 05/09/2019   Procedure: TANDEM RING INSERTION;  Surgeon: Gery Pray, MD;  Location: [facility];  Service: Urology;  Laterality: N/A;  . TANDEM RING INSERTION N/A 05/15/2019   Procedure: TANDEM RING INSERTION;  Surgeon: Gery Pray, MD;  Location: [facility];  Service: Urology;  Laterality: N/A;  . TANDEM RING  INSERTION N/A 05/22/2019   Procedure: TANDEM RING INSERTION;  Surgeon: Gery Pray, MD;  Location: [facility];  Service: Urology;  Laterality: N/A;  . TANDEM RING INSERTION N/A 05/29/2019   Procedure: TANDEM RING INSERTION;  Surgeon: Gery Pray, MD;  Location: [facility];  Service: Urology;  Laterality: N/A;  . TANDEM RING INSERTION N/A 06/05/2019   Procedure: TANDEM RING INSERTION;  Surgeon: Gery Pray, MD;  Location: [facility];  Service: Urology;  Laterality: N/A;    There were no vitals filed for this visit.   Subjective Assessment - 02/16/20 1110    Subjective I got my dilators. My toe  has a fungus so I am taking medication for it.    Pertinent History Cervical cancer; radiation 03/23/2019-06/05/2019    Patient Stated Goals improve urinary incontinence, help with use of dilators    Currently in Pain? No/denies                             [facility] Adult PT Treatment/Exercise - 02/16/20 0001      Neuro Re-ed    Neuro Re-ed Details  diaphramatic breathing from the rib cage to the pelvic floor but is only able to get to the lower abdominals      Exercises   Exercises Other Exercises    Other Exercises  sit on lacrosse ball and massge the pelvic floor      Lumbar Exercises: Stretches   Hip Flexor Stretch Right;Left;1 rep;30 seconds    Hip Flexor Stretch Limitations sitting    Piriformis Stretch Right;Left;1 rep    Piriformis Stretch Limitations while therapist massages the hamstring, gluteal, piriformis, aournd the greater trochanter, and ITB      Lumbar Exercises: Quadruped   Other Quadruped Lumbar Exercises quadruped with therapist using the addaday to the pelvic floor and aronund the ishchial tuberosity while doing a hip hinge.                  PT Education - 02/16/20 1156    Education Details diaphragmatic breathing and review the use of the dilators    Person(s) Educated Patient    Methods Explanation;Demonstration    Comprehension Verbalized understanding;Returned demonstration            PT Short Term Goals - 01/11/20 0948      PT SHORT TERM GOAL #1   Title independent with initial HEP    Time 4    Period Weeks    Status Achieved      PT SHORT TERM GOAL #2   Title educated on vaginal moisturizers to improve tissue integrity    Baseline educated    Time 4    Period Weeks    Status Achieved      PT SHORT TERM GOAL #3   Title educated on ways to use dilators to not increase abdominal pressure so they will stay in the vaginal canal    Baseline educated    Time 4    Period Weeks    Status Achieved             PT Long  Term Goals - 02/16/20 1201      PT LONG TERM GOAL #1   Title independent with advanced exercises for strength, flexibility, and endurance    Baseline not educated yet    Time 10    Period Weeks    Status On-going  PT LONG TERM GOAL #2   Title able to use the largest dilator with no pain so she is able to having penile penetration with minimal to no difficulty    Baseline has the new dilator set    Time 10    Period Weeks    Status On-going      PT LONG TERM GOAL #3   Title urinary leakage decreased >/= 75% and down to 0-1 thin pad due to increased pelvic floor strength and increased tissue mobility    Baseline 60% better    Time 10    Period Weeks    Status On-going      PT LONG TERM GOAL #4   Title able to walk for 20 minutes in the store and for exercise without having to go to the bathroom due to increased endrance and pelvic floor strength    Baseline walk 300 feet then has to go to the bathroom    Time 10    Period Weeks    Status On-going      PT LONG TERM GOAL #5   Title sit to stand </= 12 seconds due to improved strength and balance to reduce her risk of falls    Baseline 11.93    Time 10    Period Weeks    Status Achieved                 Plan - 02/16/20 1158    Clinical Impression Statement Patient has gotten the new set of larger dilators. She will be using them this weekend. Patient has difficulty with diaphragmatic breathing with moving her air into the lower abdominals to the pelvic floor. Patient has tightness in the gluteals, hamstring, and ITB. Patient continues to have firmness in the upper abdomen. Patient will benefit from skilled therapy to increase tissue elongation, increase endurance and balance and increase strength to reduce urinary leakage.    Personal Factors and Comorbidities Age;Fitness;Comorbidity 3+;Sex;Time since onset of injury/illness/exacerbation    Comorbidities Cervical cancer 03/18/2019 with radiation 03/23/2019-06/05/2019;  Diabetes; C-section 11/11/2014; nueropathy in toes    Examination-Activity Limitations Locomotion Level;Bed Mobility;Squat;Continence;Stairs;Stand;Lift;Toileting    Examination-Participation Restrictions Meal Prep;Cleaning;Community Activity;Interpersonal Relationship;Laundry    Stability/Clinical Decision Making Evolving/Moderate complexity    Rehab Potential Good    PT Frequency 2x / week    PT Duration Other (comment)   10 weeks   PT Treatment/Interventions ADLs/Self Care Home Management;Biofeedback;Functional mobility training;Therapeutic activities;Therapeutic exercise;Balance training;Neuromuscular re-education;Manual techniques;Patient/family education;Dry needling;Energy conservation;Passive range of motion;Spinal Manipulations;Joint Manipulations    PT Next Visit Plan see how the dilators are doing, use the addaday to work on hips and legs, internal work    PT Home Exercise Plan Access Code: Box and Agree with Plan of Care Patient           Patient will benefit from skilled therapeutic intervention in order to improve the following deficits and impairments:  Decreased coordination,Decreased range of motion,Difficulty walking,Increased fascial restricitons,Decreased endurance,Increased muscle spasms,Pain,Decreased activity tolerance,Decreased balance,Impaired flexibility,Decreased strength,Decreased mobility  Visit Diagnosis: Muscle weakness (generalized)  Cramp and spasm  Other abnormalities of gait and mobility  Other urinary incontinence     Problem List Patient Active Problem List   Diagnosis Date Noted  . Port-A-Cath in place 01/22/2020  . Mixed hyperlipidemia 01/10/2020  . Vitamin D deficiency 01/10/2020  . Insulin resistance 01/10/2020  . Class 3 severe obesity with serious comorbidity and body mass index (BMI) of 40.0 to 44.9 in adult [facility]) 01/10/2020  .  Obesity, Class II, BMI 35-39.9 09/07/2019  . Dehydration 05/30/2019  . Peripheral  neuropathy due to chemotherapy ([place]) 04/25/2019  . Diarrhea 04/04/2019  . CKD (chronic kidney disease), symptom management only, stage 3 (moderate) ([place]) 03/30/2019  . Generalized anxiety disorder 03/30/2019  . Essential hypertension   . Anxiety 03/23/2019  . Squamous cell carcinoma of cervix ([place]) 03/23/2019  . Deficiency anemia 03/22/2019  . Squamous cell carcinoma in situ (SCCIS) of cervix 03/22/2019    Earlie Counts, PT 02/16/20 12:03 PM   [facility] [address] [place], [place], [address] Phone: [phone]   Fax:  [phone]  Name: Tamara Osborne MRN: [id number] Date of Birth: January 11, 1976

## 2020-02-19 ENCOUNTER — Other Ambulatory Visit: Payer: Self-pay

## 2020-02-19 ENCOUNTER — Ambulatory Visit: Payer: Medicaid Other | Admitting: Physical Therapy

## 2020-02-19 ENCOUNTER — Encounter: Payer: Self-pay | Admitting: Physical Therapy

## 2020-02-19 DIAGNOSIS — M6281 Muscle weakness (generalized): Secondary | ICD-10-CM

## 2020-02-19 DIAGNOSIS — R2689 Other abnormalities of gait and mobility: Secondary | ICD-10-CM

## 2020-02-19 DIAGNOSIS — N39498 Other specified urinary incontinence: Secondary | ICD-10-CM

## 2020-02-19 DIAGNOSIS — R252 Cramp and spasm: Secondary | ICD-10-CM

## 2020-02-19 NOTE — Therapy (Signed)
[facility] [address], [place], [place], [address] Phone: [phone]   Fax:  [phone]  Physical Therapy Treatment  Patient Details  Name: Tamara Osborne MRN: [id number] Date of Birth: [birth year] Referring Provider (PT): Dr. Gery Pray   Encounter Date: 02/19/2020   PT End of Session - 02/19/20 1030    Visit Number 9    Date for PT Re-Evaluation 03/01/20    Authorization Type [employer]    Authorization Time Period 10/22-12/31    Authorization - Visit Number 9    Authorization - Number of Visits 12    PT Start Time 2297    PT Stop Time 1055    PT Time Calculation (min) 40 min    Activity Tolerance Patient tolerated treatment well;No increased pain    Behavior During Therapy WFL for tasks assessed/performed           Past Medical History:  Diagnosis Date   Anemia    Anxiety    Bartholin cyst    Cervical cancer ([place])    Constipation    Depression    Deviated septum    Diabetes mellitus without complication ([place])    Difficult intravenous access    used port for 05-09-2019 surgery   Fibroids    History of blood transfusion    2 units given 04-26-2019, has had total of 8 units since jan 2021   History of blood transfusion 1 unit given 05-30-19   iv fluids also given   History of kidney problems    History of recent blood transfusion 05/16/2019   2 units given  per dr Pollyann Savoy at cancer center   Hypertension    Neuropathy    both thumbs   Obesity    Palpitations    PONV (postoperative nausea and vomiting)    Prediabetes    Preeclampsia 2006   Sleep apnea    no cpap used insurance would not cover, osa severe per pt   SOB (shortness of breath)    Swallowing difficulty     Past Surgical History:  Procedure Laterality Date   CESAREAN SECTION WITH BILATERAL TUBAL LIGATION  11/10/2004       DILATION AND CURETTAGE OF UTERUS  1997   IR IMAGING GUIDED PORT INSERTION   04/04/2019   OPERATIVE ULTRASOUND N/A 05/09/2019   Procedure: OPERATIVE ULTRASOUND;  Surgeon: Gery Pray, MD;  Location: [facility];  Service: Urology;  Laterality: N/A;   OPERATIVE ULTRASOUND N/A 05/15/2019   Procedure: OPERATIVE ULTRASOUND;  Surgeon: Gery Pray, MD;  Location: [facility];  Service: Urology;  Laterality: N/A;   OPERATIVE ULTRASOUND N/A 05/22/2019   Procedure: OPERATIVE ULTRASOUND;  Surgeon: Gery Pray, MD;  Location: [facility];  Service: Urology;  Laterality: N/A;   OPERATIVE ULTRASOUND N/A 05/29/2019   Procedure: OPERATIVE ULTRASOUND;  Surgeon: Gery Pray, MD;  Location: [facility];  Service: Urology;  Laterality: N/A;   OPERATIVE ULTRASOUND N/A 06/05/2019   Procedure: OPERATIVE ULTRASOUND;  Surgeon: Gery Pray, MD;  Location: [facility];  Service: Urology;  Laterality: N/A;   TANDEM RING INSERTION N/A 05/09/2019   Procedure: TANDEM RING INSERTION;  Surgeon: Gery Pray, MD;  Location: [facility];  Service: Urology;  Laterality: N/A;   TANDEM RING INSERTION N/A 05/15/2019   Procedure: TANDEM RING INSERTION;  Surgeon: Gery Pray, MD;  Location: [facility];  Service: Urology;  Laterality: N/A;   TANDEM RING  INSERTION N/A 05/22/2019   Procedure: TANDEM RING INSERTION;  Surgeon: Gery Pray, MD;  Location: [facility];  Service: Urology;  Laterality: N/A;   TANDEM RING INSERTION N/A 05/29/2019   Procedure: TANDEM RING INSERTION;  Surgeon: Gery Pray, MD;  Location: [facility];  Service: Urology;  Laterality: N/A;   TANDEM RING INSERTION N/A 06/05/2019   Procedure: TANDEM RING INSERTION;  Surgeon: Gery Pray, MD;  Location: [facility];  Service: Urology;  Laterality: N/A;    There were no vitals filed for this visit.   Subjective Assessment - 02/19/20 1026    Subjective I have the dilators. The  feel better. I have pain when I am coming out. I still had some orange discharge when I take out the dilator. Janeal Holmes been working on my abdomen. I was able to go to the bathroom easier.    Pertinent History Cervical cancer; radiation 03/23/2019-06/05/2019    Patient Stated Goals improve urinary incontinence, help with use of dilators    Currently in Pain? No/denies                          Pelvic Floor Special Questions - 02/19/20 0001    Pelvic Floor Internal Exam Patient confirmed identification and approves PT to assess pelvic floor and treatment    Exam Type Vaginal             [facility] Adult PT Treatment/Exercise - 02/19/20 0001      Self-Care   Self-Care Other Self-Care Comments    Other Self-Care Comments  reviewed using the new dilators and looked at her coconut oil to see if it is organinc      Neuro Re-ed    Neuro Re-ed Details  diaphramatic breathing from the rib cage to the pelvic floor but is only able to get to the lower abdominals      Lumbar Exercises: Stretches   Hip Flexor Stretch Right;Left;1 rep;30 seconds    Hip Flexor Stretch Limitations sitting   while massaging the muscles with the addaday     Lumbar Exercises: Aerobic   Nustep level 2 for 8 minutes while asessing patient for progress      Manual Therapy   Manual Therapy Soft tissue mobilization;Internal Pelvic Floor    Soft tissue mobilization using the Addaday to the gluteal and hamstring    Internal Pelvic Floor left sidely ot bul. bulbocavernosis and perineal body                  PT Education - 02/19/20 1056    Education Details went over on how to relax the pelvic floor to take out the dilator    Person(s) Educated Patient    Methods Explanation;Demonstration;Verbal cues;Handout    Comprehension Returned demonstration;Verbalized understanding            PT Short Term Goals - 01/11/20 0948      PT SHORT TERM GOAL #1   Title independent with initial HEP    Time 4     Period Weeks    Status Achieved      PT SHORT TERM GOAL #2   Title educated on vaginal moisturizers to improve tissue integrity    Baseline educated    Time 4    Period Weeks    Status Achieved      PT SHORT TERM GOAL #3   Title educated on ways to use dilators to not increase abdominal pressure  so they will stay in the vaginal canal    Baseline educated    Time 4    Period Weeks    Status Achieved             PT Long Term Goals - 02/19/20 1059      PT LONG TERM GOAL #1   Title independent with advanced exercises for strength, flexibility, and endurance    Baseline not educated yet    Time 10    Period Weeks    Status On-going      PT LONG TERM GOAL #2   Title able to use the largest dilator with no pain so she is able to having penile penetration with minimal to no difficulty    Baseline now on dilator 5 on the largest set she ust got    Time 10    Period Weeks    Status On-going      PT LONG TERM GOAL #3   Title urinary leakage decreased >/= 75% and down to 0-1 thin pad due to increased pelvic floor strength and increased tissue mobility    Baseline 70% better    Time 10    Status On-going                 Plan - 02/19/20 1056    Clinical Impression Statement Patient is now using her new dilator set and on number 5. She has some discomfort when taking the dilator out and was educated on how to relax and bulge the pelvic floor while she is taking out the dilator. Patient has tightness in the bulbocavernosus. Patient reports her urinaty leakage is 70% better. Patient is doing the abdominal massage and it is helpeinwith her bowel movements. Patient will benefit from skilled therapy to increase the pelvic floor outlet for penile penetration and increase strength to reduce leakage.    Personal Factors and Comorbidities Age;Fitness;Comorbidity 3+;Sex;Time since onset of injury/illness/exacerbation    Comorbidities Cervical cancer 03/18/2019 with radiation  03/23/2019-06/05/2019; Diabetes; C-section 11/11/2014; nueropathy in toes    Examination-Activity Limitations Locomotion Level;Bed Mobility;Squat;Continence;Stairs;Stand;Lift;Toileting    Examination-Participation Restrictions Meal Prep;Cleaning;Community Activity;Interpersonal Relationship;Laundry    Stability/Clinical Decision Making Evolving/Moderate complexity    PT Frequency 2x / week    PT Duration Other (comment)   10 weeks   PT Treatment/Interventions ADLs/Self Care Home Management;Biofeedback;Functional mobility training;Therapeutic activities;Therapeutic exercise;Balance training;Neuromuscular re-education;Manual techniques;Patient/family education;Dry needling;Energy conservation;Passive range of motion;Spinal Manipulations;Joint Manipulations    PT Next Visit Plan see how the dilators are doing, use the addaday to work on hips and legs, internal work; work on Chief Financial Officer breathing    PT Home Exercise Plan Access Code: [email or web address]    Consulted and Agree with Plan of Care Patient           Patient will benefit from skilled therapeutic intervention in order to improve the following deficits and impairments:  Decreased coordination,Decreased range of motion,Difficulty walking,Increased fascial restricitons,Decreased endurance,Increased muscle spasms,Pain,Decreased activity tolerance,Decreased balance,Impaired flexibility,Decreased strength,Decreased mobility  Visit Diagnosis: Muscle weakness (generalized)  Cramp and spasm  Other abnormalities of gait and mobility  Other urinary incontinence     Problem List Patient Active Problem List   Diagnosis Date Noted   Port-A-Cath in place 01/22/2020   Mixed hyperlipidemia 01/10/2020   Vitamin D deficiency 01/10/2020   Insulin resistance 01/10/2020   Class 3 severe obesity with serious comorbidity and body mass index (BMI) of 40.0 to 44.9 in adult ([place]) 01/10/2020   Obesity, Class II, BMI 35-39.9 09/07/2019  Dehydration  05/30/2019   Peripheral neuropathy due to chemotherapy ([place]) 04/25/2019   Diarrhea 04/04/2019   CKD (chronic kidney disease), symptom management only, stage 3 (moderate) ([place]) 03/30/2019   Generalized anxiety disorder 03/30/2019   Essential hypertension    Anxiety 03/23/2019   Squamous cell carcinoma of cervix ([place]) 03/23/2019   Deficiency anemia 03/22/2019   Squamous cell carcinoma in situ (SCCIS) of cervix 03/22/2019    Earlie Counts, PT 02/19/20 11:01 AM   [facility] [address], [place], [place], [address] Phone: [phone]   Fax:  [phone]  Name: KEILEE DENMAN MRN: [id number] Date of Birth: 06-10-1975

## 2020-02-21 ENCOUNTER — Ambulatory Visit: Payer: Medicaid Other | Admitting: Physical Therapy

## 2020-02-21 ENCOUNTER — Encounter: Payer: Self-pay | Admitting: Physical Therapy

## 2020-02-21 ENCOUNTER — Other Ambulatory Visit: Payer: Self-pay

## 2020-02-21 DIAGNOSIS — R252 Cramp and spasm: Secondary | ICD-10-CM

## 2020-02-21 DIAGNOSIS — M6281 Muscle weakness (generalized): Secondary | ICD-10-CM

## 2020-02-21 DIAGNOSIS — R2689 Other abnormalities of gait and mobility: Secondary | ICD-10-CM

## 2020-02-21 DIAGNOSIS — N39498 Other specified urinary incontinence: Secondary | ICD-10-CM

## 2020-02-21 NOTE — Patient Instructions (Signed)
Access Code: [email or web address] URL: [email or web address] Date: 02/21/2020 Prepared by: Earlie Counts  Exercises Seated Piriformis Stretch with Trunk Bend - 1 x daily - 7 x weekly - 1 sets - 2 reps - 30 sec hold Seated Hamstring Stretch - 1 x daily - 7 x weekly - 1 sets - 2 reps - 30 sec hold Seated Hip Adductor Stretch - 1 x daily - 7 x weekly - 1 sets - 2 reps - 30 sec hold Sit to Stand - 1 x daily - 7 x weekly - 1 sets - 10 reps Standing Single Leg Stance with Counter Support - 1 x daily - 7 x weekly - 1 sets - 3 reps - 15 sec hold Tandem Stance with Support - 1 x daily - 7 x weekly - 1 sets - 2 reps - 30 sec hold Standing Anti-Rotation Press with Anchored Resistance - 1 x daily - 7 x weekly - 2 sets - 10 reps Standing Diagonal Chop - 1 x daily - 7 x weekly - 2 sets - 10 reps Standing Chest Press with Resistance Band with PLB - 1 x daily - 7 x weekly - 2 sets - 10 reps Deadlift with Resistance - 1 x daily - 7 x weekly - 2 sets - 10 reps Side Stepping with Resistance at Thighs - 1 x daily - 7 x weekly - 2 sets - 10 reps [facility] [address] [place], [place] [address] Phone # [phone] Fax [phone]

## 2020-02-21 NOTE — Therapy (Signed)
[facility] [address], [place], [place], [address] Phone: [phone]   Fax:  [phone]  Physical Therapy Treatment  Patient Details  Name: Tamara Osborne MRN: [id number] Date of Birth: 03/18/1975 Referring Provider (PT): Dr. Gery Pray   Encounter Date: 02/21/2020   PT End of Session - 02/21/20 1056    Visit Number 10    Date for PT Re-Evaluation 03/01/20    Authorization Type [employer]    Authorization Time Period 10/22-12/31    Authorization - Visit Number 10    Authorization - Number of Visits 12    PT Start Time 5852    PT Stop Time 1055    PT Time Calculation (min) 40 min    Activity Tolerance Patient tolerated treatment well;No increased pain    Behavior During Therapy WFL for tasks assessed/performed           Past Medical History:  Diagnosis Date  . Anemia   . Anxiety   . Bartholin cyst   . Cervical cancer ([place])   . Constipation   . Depression   . Deviated septum   . Diabetes mellitus without complication ([place])   . Difficult intravenous access    used port for 05-09-2019 surgery  . Fibroids   . History of blood transfusion    2 units given 04-26-2019, has had total of 8 units since jan 2021  . History of blood transfusion 1 unit given 05-30-19   iv fluids also given  . History of kidney problems   . History of recent blood transfusion 05/16/2019   2 units given  per dr Pollyann Savoy at cancer center  . Hypertension   . Neuropathy    both thumbs  . Obesity   . Palpitations   . PONV (postoperative nausea and vomiting)   . Prediabetes   . Preeclampsia 2006  . Sleep apnea    no cpap used insurance would not cover, osa severe per pt  . SOB (shortness of breath)   . Swallowing difficulty     Past Surgical History:  Procedure Laterality Date  . CESAREAN SECTION WITH BILATERAL TUBAL LIGATION  11/10/2004      . DILATION AND CURETTAGE OF UTERUS  1997  . IR IMAGING GUIDED PORT  INSERTION  04/04/2019  . OPERATIVE ULTRASOUND N/A 05/09/2019   Procedure: OPERATIVE ULTRASOUND;  Surgeon: Gery Pray, MD;  Location: [facility];  Service: Urology;  Laterality: N/A;  . OPERATIVE ULTRASOUND N/A 05/15/2019   Procedure: OPERATIVE ULTRASOUND;  Surgeon: Gery Pray, MD;  Location: [facility];  Service: Urology;  Laterality: N/A;  . OPERATIVE ULTRASOUND N/A 05/22/2019   Procedure: OPERATIVE ULTRASOUND;  Surgeon: Gery Pray, MD;  Location: [facility];  Service: Urology;  Laterality: N/A;  . OPERATIVE ULTRASOUND N/A 05/29/2019   Procedure: OPERATIVE ULTRASOUND;  Surgeon: Gery Pray, MD;  Location: [facility];  Service: Urology;  Laterality: N/A;  . OPERATIVE ULTRASOUND N/A 06/05/2019   Procedure: OPERATIVE ULTRASOUND;  Surgeon: Gery Pray, MD;  Location: [facility];  Service: Urology;  Laterality: N/A;  . TANDEM RING INSERTION N/A 05/09/2019   Procedure: TANDEM RING INSERTION;  Surgeon: Gery Pray, MD;  Location: [facility];  Service: Urology;  Laterality: N/A;  . TANDEM RING INSERTION N/A 05/15/2019   Procedure: TANDEM RING INSERTION;  Surgeon: Gery Pray, MD;  Location: [facility];  Service: Urology;  Laterality: N/A;  . TANDEM RING  INSERTION N/A 05/22/2019   Procedure: TANDEM RING INSERTION;  Surgeon: Gery Pray, MD;  Location: [facility];  Service: Urology;  Laterality: N/A;  . TANDEM RING INSERTION N/A 05/29/2019   Procedure: TANDEM RING INSERTION;  Surgeon: Gery Pray, MD;  Location: [facility];  Service: Urology;  Laterality: N/A;  . TANDEM RING INSERTION N/A 06/05/2019   Procedure: TANDEM RING INSERTION;  Surgeon: Gery Pray, MD;  Location: [facility];  Service: Urology;  Laterality: N/A;    There were no vitals filed for this visit.   Subjective Assessment - 02/21/20 1020    Subjective I had more pain  when I use the dilator on the sofa but when I do it in the bed it is better.    Pertinent History Cervical cancer; radiation 03/23/2019-06/05/2019    Patient Stated Goals improve urinary incontinence, help with use of dilators    Currently in Pain? No/denies    Multiple Pain Sites No                             [facility] Adult PT Treatment/Exercise - 02/21/20 0001      Lumbar Exercises: Aerobic   Nustep level 2 for 8 minutes while asessing patient for progress      Lumbar Exercises: Standing   Other Standing Lumbar Exercises paloff press with red band    Other Standing Lumbar Exercises lunge with diagonal going to the forward knee with pelvic floor contraction; dead lift with red band and tactile cues to perform correctly; side stepping with red band around knees 4 times                  PT Education - 02/21/20 1055    Education Details Access Code: [email or web address]    Person(s) Educated Patient    Methods Explanation;Demonstration;Verbal cues;Handout    Comprehension Returned demonstration;Verbalized understanding            PT Short Term Goals - 01/11/20 0948      PT SHORT TERM GOAL #1   Title independent with initial HEP    Time 4    Period Weeks    Status Achieved      PT SHORT TERM GOAL #2   Title educated on vaginal moisturizers to improve tissue integrity    Baseline educated    Time 4    Period Weeks    Status Achieved      PT SHORT TERM GOAL #3   Title educated on ways to use dilators to not increase abdominal pressure so they will stay in the vaginal canal    Baseline educated    Time 4    Period Weeks    Status Achieved             PT Long Term Goals - 02/19/20 1059      PT LONG TERM GOAL #1   Title independent with advanced exercises for strength, flexibility, and endurance    Baseline not educated yet    Time 10    Period Weeks    Status On-going      PT LONG TERM GOAL #2   Title able to use the largest dilator with no pain so  she is able to having penile penetration with minimal to no difficulty    Baseline now on dilator 5 on the largest set she ust got    Time 10    Period Weeks  Status On-going      PT LONG TERM GOAL #3   Title urinary leakage decreased >/= 75% and down to 0-1 thin pad due to increased pelvic floor strength and increased tissue mobility    Baseline 70% better    Time 10    Status On-going                 Plan - 02/21/20 1056    Clinical Impression Statement Patient reports her urinary leakage is 70% better and she is not using a pad. Patient is working with the number 5 dilator and has no pain if she is using it on the bed. Patient is learning more advanced HEP for core and pelvic floor strength. Patient will benefit from skilled therapy to increase the pelvic floor outlet for penile penetration and increase strength to reduce leakage.    Personal Factors and Comorbidities Age;Fitness;Comorbidity 3+;Sex;Time since onset of injury/illness/exacerbation    Comorbidities Cervical cancer 03/18/2019 with radiation 03/23/2019-06/05/2019; Diabetes; C-section 11/11/2014; nueropathy in toes    Examination-Activity Limitations Locomotion Level;Bed Mobility;Squat;Continence;Stairs;Stand;Lift;Toileting    Examination-Participation Restrictions Meal Prep;Cleaning;Community Activity;Interpersonal Relationship;Laundry    Stability/Clinical Decision Making Evolving/Moderate complexity    Rehab Potential Good    PT Frequency 2x / week    PT Duration Other (comment)   10 weeks   PT Treatment/Interventions ADLs/Self Care Home Management;Biofeedback;Functional mobility training;Therapeutic activities;Therapeutic exercise;Balance training;Neuromuscular re-education;Manual techniques;Patient/family education;Dry needling;Energy conservation;Passive range of motion;Spinal Manipulations;Joint Manipulations    PT Next Visit Plan internal work, core strength with diagonals    PT Home Exercise Plan Access Code:  [email or web address]    Consulted and Agree with Plan of Care Patient           Patient will benefit from skilled therapeutic intervention in order to improve the following deficits and impairments:  Decreased coordination,Decreased range of motion,Difficulty walking,Increased fascial restricitons,Decreased endurance,Increased muscle spasms,Pain,Decreased activity tolerance,Decreased balance,Impaired flexibility,Decreased strength,Decreased mobility  Visit Diagnosis: Muscle weakness (generalized)  Cramp and spasm  Other abnormalities of gait and mobility  Other urinary incontinence     Problem List Patient Active Problem List   Diagnosis Date Noted  . Port-A-Cath in place 01/22/2020  . Mixed hyperlipidemia 01/10/2020  . Vitamin D deficiency 01/10/2020  . Insulin resistance 01/10/2020  . Class 3 severe obesity with serious comorbidity and body mass index (BMI) of 40.0 to 44.9 in adult [facility]) 01/10/2020  . Obesity, Class II, BMI 35-39.9 09/07/2019  . Dehydration 05/30/2019  . Peripheral neuropathy due to chemotherapy ([place]) 04/25/2019  . Diarrhea 04/04/2019  . CKD (chronic kidney disease), symptom management only, stage 3 (moderate) ([place]) 03/30/2019  . Generalized anxiety disorder 03/30/2019  . Essential hypertension   . Anxiety 03/23/2019  . Squamous cell carcinoma of cervix ([place]) 03/23/2019  . Deficiency anemia 03/22/2019  . Squamous cell carcinoma in situ (SCCIS) of cervix 03/22/2019    Earlie Counts, PT 02/21/20 10:59 AM   [facility] [address] [place], [place], [address] Phone: [phone]   Fax:  [phone]  Name: Tamara Osborne MRN: [id number] Date of Birth: [birth year]

## 2020-02-28 ENCOUNTER — Encounter: Payer: Self-pay | Admitting: Physical Therapy

## 2020-02-28 ENCOUNTER — Ambulatory Visit: Payer: Medicaid Other | Admitting: Physical Therapy

## 2020-02-28 ENCOUNTER — Other Ambulatory Visit: Payer: Self-pay

## 2020-02-28 DIAGNOSIS — M6281 Muscle weakness (generalized): Secondary | ICD-10-CM | POA: Diagnosis not present

## 2020-02-28 DIAGNOSIS — R2689 Other abnormalities of gait and mobility: Secondary | ICD-10-CM

## 2020-02-28 DIAGNOSIS — R252 Cramp and spasm: Secondary | ICD-10-CM

## 2020-02-28 DIAGNOSIS — N39498 Other specified urinary incontinence: Secondary | ICD-10-CM

## 2020-02-28 NOTE — Therapy (Signed)
[facility] [address] [place], [place], [address] Phone: [phone]   Fax:  [phone]  Physical Therapy Treatment  Patient Details  Name: Tamara Osborne MRN: [id number] Date of Birth: 1975/12/20 Referring Provider (PT): Dr. Gery Pray   Encounter Date: 02/28/2020   PT End of Session - 02/28/20 1052    Visit Number 11    Date for PT Re-Evaluation 04/26/20    Authorization Type [employer]    Authorization Time Period 10/22-12/31    Authorization - Visit Number 11    Authorization - Number of Visits 12    PT Start Time [id number]    PT Stop Time [id number]    PT Time Calculation (min) 38 min    Activity Tolerance Patient tolerated treatment well;No increased pain    Behavior During Therapy WFL for tasks assessed/performed           Past Medical History:  Diagnosis Date  . Anemia   . Anxiety   . Bartholin cyst   . Cervical cancer ([place])   . Constipation   . Depression   . Deviated septum   . Diabetes mellitus without complication ([place])   . Difficult intravenous access    used port for 05-09-2019 surgery  . Fibroids   . History of blood transfusion    2 units given 04-26-2019, has had total of 8 units since jan 2021  . History of blood transfusion 1 unit given 05-30-19   iv fluids also given  . History of kidney problems   . History of recent blood transfusion 05/16/2019   2 units given  per dr Pollyann Savoy at cancer center  . Hypertension   . Neuropathy    both thumbs  . Obesity   . Palpitations   . PONV (postoperative nausea and vomiting)   . Prediabetes   . Preeclampsia 2006  . Sleep apnea    no cpap used insurance would not cover, osa severe per pt  . SOB (shortness of breath)   . Swallowing difficulty     Past Surgical History:  Procedure Laterality Date  . CESAREAN SECTION WITH BILATERAL TUBAL LIGATION  11/10/2004      . DILATION AND CURETTAGE OF UTERUS  1997  . IR IMAGING GUIDED PORT  INSERTION  04/04/2019  . OPERATIVE ULTRASOUND N/A 05/09/2019   Procedure: OPERATIVE ULTRASOUND;  Surgeon: Gery Pray, MD;  Location[facility];  Service: Urology;  Laterality: N/A;  . OPERATIVE ULTRASOUND N/A 05/15/2019   Procedure: OPERATIVE ULTRASOUND;  Surgeon: Gery Pray, MD;  Location: [facility];  Service: Urology;  Laterality: N/A;  . OPERATIVE ULTRASOUND N/A 05/22/2019   Procedure: OPERATIVE ULTRASOUND;  Surgeon: Gery Pray, MD;  Location: [facility];  Service: Urology;  Laterality: N/A;  . OPERATIVE ULTRASOUND N/A 05/29/2019   Procedure: OPERATIVE ULTRASOUND;  Surgeon: Gery Pray, MD;  Location: [facility];  Service: Urology;  Laterality: N/A;  . OPERATIVE ULTRASOUND N/A 06/05/2019   Procedure: OPERATIVE ULTRASOUND;  Surgeon: Gery Pray, MD;  Location: [facility];  Service: Urology;  Laterality: N/A;  . TANDEM RING INSERTION N/A 05/09/2019   Procedure: TANDEM RING INSERTION;  Surgeon: Gery Pray, MD;  Location: [facility];  Service: Urology;  Laterality: N/A;  . TANDEM RING INSERTION N/A 05/15/2019   Procedure: TANDEM RING INSERTION;  Surgeon: Gery Pray, MD;  Location: [facility];  Service: Urology;  Laterality: N/A;  . TANDEM RING  INSERTION N/A 05/22/2019   Procedure: TANDEM RING INSERTION;  Surgeon: Antony Blackbird, MD;  Location: [facility];  Service: Urology;  Laterality: N/A;  . TANDEM RING INSERTION N/A 05/29/2019   Procedure: TANDEM RING INSERTION;  Surgeon: Antony Blackbird, MD;  Location: [facility];  Service: Urology;  Laterality: N/A;  . TANDEM RING INSERTION N/A 06/05/2019   Procedure: TANDEM RING INSERTION;  Surgeon: Antony Blackbird, MD;  Location: [facility];  Service: Urology;  Laterality: N/A;    There were no vitals filed for this visit.   Subjective Assessment - 02/28/20 1020    Subjective Exercises are  helping my balance. I am on Number 6 dilator.    Pertinent History Cervical cancer; radiation 03/23/2019-06/05/2019    Patient Stated Goals improve urinary incontinence, help with use of dilators    Currently in Pain? No/denies              [facility] PT Assessment - 02/28/20 0001      Assessment   Medical Diagnosis C53.9 Squamous cell carcinoma of cervix    Referring Provider (PT) Dr. Antony Blackbird    Onset Date/Surgical Date 03/18/19      Precautions   Precautions Other (comment)    Precaution Comments cervical cancer with radiation      Restrictions   Weight Bearing Restrictions No      Prior Function   Level of Independence Independent      Cognition   Overall Cognitive Status Within Functional Limits for tasks assessed      ROM / Strength   AROM / PROM / Strength AROM;PROM;Strength      PROM   Right Hip Internal Rotation  15    Left Hip Internal Rotation  15      Strength   Right Hip Flexion 4/5    Right Hip Extension 4/5    Right Hip External Rotation  4/5    Right Hip ABduction 4+/5    Left Hip Flexion 5/5    Left Hip Extension 4/5    Left Hip External Rotation 5/5    Left Hip ABduction 4+/5                      Pelvic Floor Special Questions - 02/28/20 0001    Urinary Leakage No    Pad use no pad    Pelvic Floor Internal Exam Patient confirmed identification and approves PT to assess pelvic floor and treatment    Exam Type Vaginal    Strength fair squeeze, definite lift             [facility] Adult PT Treatment/Exercise - 02/28/20 0001      Lumbar Exercises: Aerobic   Nustep level 2 for 9 minutes while asessing patient for progress      Manual Therapy   Manual Therapy Internal Pelvic Floor    Internal Pelvic Floor left sidely to the perineal body, laon gthe puborectalis, levator ani and bulbocavernsous                    PT Short Term Goals - 01/11/20 0948      PT SHORT TERM GOAL #1   Title independent with initial HEP    Time  4    Period Weeks    Status Achieved      PT SHORT TERM GOAL #2   Title educated on vaginal moisturizers to improve tissue integrity    Baseline educated  Time 4    Period Weeks    Status Achieved      PT SHORT TERM GOAL #3   Title educated on ways to use dilators to not increase abdominal pressure so they will stay in the vaginal canal    Baseline educated    Time 4    Period Weeks    Status Achieved             PT Long Term Goals - 02/28/20 1034      PT LONG TERM GOAL #1   Title independent with advanced exercises for strength, flexibility, and endurance    Baseline advance as she changes    Time 10    Period Weeks    Status On-going      PT LONG TERM GOAL #2   Title able to use the largest dilator with no pain so she is able to having penile penetration with minimal to no difficulty    Baseline now on dilator 6 on the largest set she ust got    Time 10    Status On-going      PT LONG TERM GOAL #3   Title urinary leakage decreased >/= 75% and down to 0-1 thin pad due to increased pelvic floor strength and increased tissue mobility    Time 10    Period Weeks    Status Achieved      PT LONG TERM GOAL #4   Title able to walk for 20 minutes in the store and for exercise without having to go to the bathroom due to increased endrance and pelvic floor strength    Time 10    Period Weeks    Status Achieved      PT LONG TERM GOAL #5   Title sit to stand </= 12 seconds due to improved strength and balance to reduce her risk of falls    Baseline 11.93    Time 10    Period Weeks    Status Achieved                 Plan - 02/28/20 1053    Clinical Impression Statement Patent reports no urinary leakage and does not wear pads. Her pelvic floor strength is 3/5. She has tight perineal body, levator ani and puborectalis. Patient is on the 6th dilator and has 2 to 3 more to go. Patient is not able to have penile penetration yet due to pain. Patient has increased hip  strength bilaterally. Patient has some difficulty with her balance due to the neuropathy in her feet. She is now walking 20 minutes per day. Patient will benefit from skilled therapy to increase the pelvic floor outlet for penile penetration and increase her core strength.    Personal Factors and Comorbidities Age;Fitness;Comorbidity 3+;Sex;Time since onset of injury/illness/exacerbation    Comorbidities Cervical cancer 03/18/2019 with radiation 03/23/2019-06/05/2019; Diabetes; C-section 11/11/2014; nueropathy in toes    Examination-Activity Limitations Locomotion Level;Bed Mobility;Squat;Continence;Stairs;Stand;Lift;Toileting    Examination-Participation Restrictions Meal Prep;Cleaning;Community Activity;Interpersonal Relationship;Laundry    Stability/Clinical Decision Making Evolving/Moderate complexity    Rehab Potential Good    PT Frequency 2x / week    PT Duration 8 weeks   10 weeks   PT Treatment/Interventions ADLs/Self Care Home Management;Biofeedback;Functional mobility training;Therapeutic activities;Therapeutic exercise;Balance training;Neuromuscular re-education;Manual techniques;Patient/family education;Dry needling;Energy conservation;Passive range of motion;Spinal Manipulations;Joint Manipulations    PT Next Visit Plan internal work, core strength with diagonals    PT Home Exercise Plan Access Code: [email or web address]    Recommended Other Services  renewal put in 12/29    Consulted and Agree with Plan of Care Patient           Patient will benefit from skilled therapeutic intervention in order to improve the following deficits and impairments:  Decreased coordination,Decreased range of motion,Difficulty walking,Increased fascial restricitons,Decreased endurance,Increased muscle spasms,Pain,Decreased activity tolerance,Decreased balance,Impaired flexibility,Decreased strength,Decreased mobility  Visit Diagnosis: Muscle weakness (generalized) - Plan: PT plan of care cert/re-cert  Cramp and spasm  - Plan: PT plan of care cert/re-cert  Other abnormalities of gait and mobility - Plan: PT plan of care cert/re-cert  Other urinary incontinence - Plan: PT plan of care cert/re-cert     Problem List Patient Active Problem List   Diagnosis Date Noted  . Port-A-Cath in place 01/22/2020  . Mixed hyperlipidemia 01/10/2020  . Vitamin D deficiency 01/10/2020  . Insulin resistance 01/10/2020  . Class 3 severe obesity with serious comorbidity and body mass index (BMI) of 40.0 to 44.9 in adult [facility]) 01/10/2020  . Obesity, Class II, BMI 35-39.9 09/07/2019  . Dehydration 05/30/2019  . Peripheral neuropathy due to chemotherapy ([place]) 04/25/2019  . Diarrhea 04/04/2019  . CKD (chronic kidney disease), symptom management only, stage 3 (moderate) ([place]) 03/30/2019  . Generalized anxiety disorder 03/30/2019  . Essential hypertension   . Anxiety 03/23/2019  . Squamous cell carcinoma of cervix ([place]) 03/23/2019  . Deficiency anemia 03/22/2019  . Squamous cell carcinoma in situ (SCCIS) of cervix 03/22/2019    Earlie Counts, PT 02/28/20 11:02 AM   [facility] [address], [place], [place], [address] Phone: [phone]   Fax:  [phone]  Name: ANDRENA HOLDERNESS MRN: [id number] Date of Birth: 06-08-1975

## 2020-03-04 ENCOUNTER — Ambulatory Visit: Payer: Medicaid Other | Admitting: Physical Therapy

## 2020-03-06 ENCOUNTER — Telehealth: Payer: Self-pay

## 2020-03-06 NOTE — Telephone Encounter (Signed)
Pt has not started cpap per [employer]: "Not yet, we have all docs needed including Prior authorization. We are currently just waiting on inventory to set her up."  I called pt to discuss and recommend postponing her appt another few months until she has started cpap. Please discuss this with pt if she calls back.

## 2020-03-06 NOTE — Telephone Encounter (Signed)
Pt returned my call. She is agreeable to postponing the appt until late March. An appt was scheduled for 05/30/20 at 10:30am. Pt verbalized understanding of new appt date and time.

## 2020-03-07 ENCOUNTER — Other Ambulatory Visit: Payer: Self-pay

## 2020-03-07 ENCOUNTER — Encounter (INDEPENDENT_AMBULATORY_CARE_PROVIDER_SITE_OTHER): Payer: Self-pay | Admitting: Adult Health

## 2020-03-07 ENCOUNTER — Ambulatory Visit (INDEPENDENT_AMBULATORY_CARE_PROVIDER_SITE_OTHER): Payer: Medicaid Other | Admitting: Adult Health

## 2020-03-07 VITALS — BP 144/87 | HR 92 | Temp 98.3°F | Ht 61.0 in | Wt 217.0 lb

## 2020-03-07 DIAGNOSIS — Z6841 Body Mass Index (BMI) 40.0 and over, adult: Secondary | ICD-10-CM

## 2020-03-07 DIAGNOSIS — E8881 Metabolic syndrome: Secondary | ICD-10-CM

## 2020-03-07 DIAGNOSIS — I10 Essential (primary) hypertension: Secondary | ICD-10-CM

## 2020-03-08 ENCOUNTER — Encounter: Payer: Self-pay | Admitting: Physical Therapy

## 2020-03-08 ENCOUNTER — Ambulatory Visit: Payer: Medicaid Other | Attending: Radiation Oncology | Admitting: Physical Therapy

## 2020-03-08 DIAGNOSIS — R252 Cramp and spasm: Secondary | ICD-10-CM | POA: Insufficient documentation

## 2020-03-08 DIAGNOSIS — R2689 Other abnormalities of gait and mobility: Secondary | ICD-10-CM | POA: Insufficient documentation

## 2020-03-08 DIAGNOSIS — N39498 Other specified urinary incontinence: Secondary | ICD-10-CM | POA: Diagnosis present

## 2020-03-08 DIAGNOSIS — M6281 Muscle weakness (generalized): Secondary | ICD-10-CM | POA: Insufficient documentation

## 2020-03-08 NOTE — Therapy (Signed)
[facility] [address], [place], [place], [address] Phone: [phone]   Fax:  [phone]  Physical Therapy Treatment  Patient Details  Name: Tamara Osborne MRN: [id number] Date of Birth: [birth year] Referring Provider (PT): Dr. Gery Pray   Encounter Date: 03/08/2020   PT End of Session - 03/08/20 1048    Visit Number 12    Date for PT Re-Evaluation 04/26/20    Authorization Type [employer]    Authorization - Visit Number 12    Authorization - Number of Visits 12    PT Start Time [id number]    PT Stop Time [id number]    PT Time Calculation (min) 38 min    Activity Tolerance Patient tolerated treatment well;No increased pain    Behavior During Therapy WFL for tasks assessed/performed           Past Medical History:  Diagnosis Date  . Anemia   . Anxiety   . Bartholin cyst   . Cervical cancer ([place])   . Constipation   . Depression   . Deviated septum   . Diabetes mellitus without complication ([place])   . Difficult intravenous access    used port for 05-09-2019 surgery  . Fibroids   . History of blood transfusion    2 units given 04-26-2019, has had total of 8 units since jan 2021  . History of blood transfusion 1 unit given 05-30-19   iv fluids also given  . History of kidney problems   . History of recent blood transfusion 05/16/2019   2 units given  per dr Pollyann Savoy at cancer center  . Hypertension   . Neuropathy    both thumbs  . Obesity   . Palpitations   . PONV (postoperative nausea and vomiting)   . Prediabetes   . Preeclampsia 2006  . Sleep apnea    no cpap used insurance would not cover, osa severe per pt  . SOB (shortness of breath)   . Swallowing difficulty     Past Surgical History:  Procedure Laterality Date  . CESAREAN SECTION WITH BILATERAL TUBAL LIGATION  11/10/2004      . DILATION AND CURETTAGE OF UTERUS  1997  . IR IMAGING GUIDED PORT INSERTION  04/04/2019  . OPERATIVE ULTRASOUND N/A  05/09/2019   Procedure: OPERATIVE ULTRASOUND;  Surgeon: Gery Pray, MD;  Location: [facility];  Service: Urology;  Laterality: N/A;  . OPERATIVE ULTRASOUND N/A 05/15/2019   Procedure: OPERATIVE ULTRASOUND;  Surgeon: Gery Pray, MD;  Location: [facility];  Service: Urology;  Laterality: N/A;  . OPERATIVE ULTRASOUND N/A 05/22/2019   Procedure: OPERATIVE ULTRASOUND;  Surgeon: Gery Pray, MD;  Location: [facility];  Service: Urology;  Laterality: N/A;  . OPERATIVE ULTRASOUND N/A 05/29/2019   Procedure: OPERATIVE ULTRASOUND;  Surgeon: Gery Pray, MD;  Location: [facility];  Service: Urology;  Laterality: N/A;  . OPERATIVE ULTRASOUND N/A 06/05/2019   Procedure: OPERATIVE ULTRASOUND;  Surgeon: Gery Pray, MD;  Location: [facility];  Service: Urology;  Laterality: N/A;  . TANDEM RING INSERTION N/A 05/09/2019   Procedure: TANDEM RING INSERTION;  Surgeon: Gery Pray, MD;  Location: [facility];  Service: Urology;  Laterality: N/A;  . TANDEM RING INSERTION N/A 05/15/2019   Procedure: TANDEM RING INSERTION;  Surgeon: Gery Pray, MD;  Location: [facility];  Service: Urology;  Laterality: N/A;  . TANDEM RING INSERTION N/A 05/22/2019   Procedure: TANDEM  RING INSERTION;  Surgeon: Gery Pray, MD;  Location: [facility];  Service: Urology;  Laterality: N/A;  . TANDEM RING INSERTION N/A 05/29/2019   Procedure: TANDEM RING INSERTION;  Surgeon: Gery Pray, MD;  Location: [facility];  Service: Urology;  Laterality: N/A;  . TANDEM RING INSERTION N/A 06/05/2019   Procedure: TANDEM RING INSERTION;  Surgeon: Gery Pray, MD;  Location: [facility];  Service: Urology;  Laterality: N/A;    There were no vitals filed for this visit.   Subjective Assessment - 03/08/20 1019    Subjective I still on number 6 dilator. I have some swelling in more feet.     Patient Stated Goals improve urinary incontinence, help with use of dilators    Currently in Pain? No/denies              [facility] PT Assessment - 03/08/20 0001      Strength   Overall Strength Comments PF strength is 3/5 bil.                         [place] Adult PT Treatment/Exercise - 03/08/20 0001      Lumbar Exercises: Aerobic   Nustep level 2 for 9 minutes while asessing patient for progress      Lumbar Exercises: Standing   Heel Raises 15 reps   right and left with VC to keep knees straight   Other Standing Lumbar Exercises paloff press with red band    Other Standing Lumbar Exercises stand pulling on red band opposite ways like arm swing for walking 20x each working on core and pelvic floor      Knee/Hip Exercises: Standing   Lateral Step Up Right;Left;1 set;15 reps;Hand Hold: 1;Step Height: 4"    Forward Step Up Right;Left;1 set;20 reps;Hand Hold: 1;Step Height: 4"    SLS SLS while perfroming bilateral shoulder extension with red band                    PT Short Term Goals - 01/11/20 0948      PT SHORT TERM GOAL #1   Title independent with initial HEP    Time 4    Period Weeks    Status Achieved      PT SHORT TERM GOAL #2   Title educated on vaginal moisturizers to improve tissue integrity    Baseline educated    Time 4    Period Weeks    Status Achieved      PT SHORT TERM GOAL #3   Title educated on ways to use dilators to not increase abdominal pressure so they will stay in the vaginal canal    Baseline educated    Time 4    Period Weeks    Status Achieved             PT Long Term Goals - 02/28/20 1034      PT LONG TERM GOAL #1   Title independent with advanced exercises for strength, flexibility, and endurance    Baseline advance as she changes    Time 10    Period Weeks    Status On-going      PT LONG TERM GOAL #2   Title able to use the largest dilator with no pain so she is able to having penile penetration with  minimal to no difficulty    Baseline now on dilator 6 on the largest set she ust got  Time 10    Status On-going      PT LONG TERM GOAL #3   Title urinary leakage decreased >/= 75% and down to 0-1 thin pad due to increased pelvic floor strength and increased tissue mobility    Time 10    Period Weeks    Status Achieved      PT LONG TERM GOAL #4   Title able to walk for 20 minutes in the store and for exercise without having to go to the bathroom due to increased endrance and pelvic floor strength    Time 10    Period Weeks    Status Achieved      PT LONG TERM GOAL #5   Title sit to stand </= 12 seconds due to improved strength and balance to reduce her risk of falls    Baseline 11.93    Time 10    Period Weeks    Status Achieved                 Plan - 03/08/20 1053    Clinical Impression Statement Patient is still on the 6th dilator. She feels her balance is improving. Patient has increased swelling in her legs. Patient is working on balance, core strength and pelvic floor strength in therapy at this time as she is using the dialtors at home. Plantarflexion strength is 3/5. Improvement of this will improve balance. Patient will benefit from skilled therapy to increase pelvic floor outlet for penile penetration and increase her core strength.    Personal Factors and Comorbidities Age;Fitness;Comorbidity 3+;Sex;Time since onset of injury/illness/exacerbation    Comorbidities Cervical cancer 03/18/2019 with radiation 03/23/2019-06/05/2019; Diabetes; C-section 11/11/2014; nueropathy in toes    Examination-Activity Limitations Locomotion Level;Bed Mobility;Squat;Continence;Stairs;Stand;Lift;Toileting    Examination-Participation Restrictions Meal Prep;Cleaning;Community Activity;Interpersonal Relationship;Laundry    Stability/Clinical Decision Making Evolving/Moderate complexity    Rehab Potential Good    PT Frequency 2x / week    PT Duration 8 weeks    PT Treatment/Interventions  ADLs/Self Care Home Management;Biofeedback;Functional mobility training;Therapeutic activities;Therapeutic exercise;Balance training;Neuromuscular re-education;Manual techniques;Patient/family education;Dry needling;Energy conservation;Passive range of motion;Spinal Manipulations;Joint Manipulations    PT Next Visit Plan internal work, core strength with diagonals; balance    PT Home Exercise Plan Access Code: [email or web address]    Recommended Other Services MD has signed all notes    Consulted and Agree with Plan of Care Patient           Patient will benefit from skilled therapeutic intervention in order to improve the following deficits and impairments:  Decreased coordination,Decreased range of motion,Difficulty walking,Increased fascial restricitons,Decreased endurance,Increased muscle spasms,Pain,Decreased activity tolerance,Decreased balance,Impaired flexibility,Decreased strength,Decreased mobility  Visit Diagnosis: Muscle weakness (generalized)  Cramp and spasm  Other abnormalities of gait and mobility  Other urinary incontinence     Problem List Patient Active Problem List   Diagnosis Date Noted  . Port-A-Cath in place 01/22/2020  . Mixed hyperlipidemia 01/10/2020  . Vitamin D deficiency 01/10/2020  . Insulin resistance 01/10/2020  . Class 3 severe obesity with serious comorbidity and body mass index (BMI) of 40.0 to 44.9 in adult [facility]) 01/10/2020  . Obesity, Class II, BMI 35-39.9 09/07/2019  . Dehydration 05/30/2019  . Peripheral neuropathy due to chemotherapy ([place]) 04/25/2019  . Diarrhea 04/04/2019  . CKD (chronic kidney disease), symptom management only, stage 3 (moderate) ([place]) 03/30/2019  . Generalized anxiety disorder 03/30/2019  . Essential hypertension   . Anxiety 03/23/2019  . Squamous cell carcinoma of cervix ([place]) 03/23/2019  . Deficiency anemia 03/22/2019  .  Squamous cell carcinoma in situ (SCCIS) of cervix 03/22/2019    Earlie Counts, PT 03/08/20 10:58  AM   [facility] [address], [place], [place], [address] Phone: [phone]   Fax:  [phone]  Name: KAISLEY STIVERSON MRN: [id number] Date of Birth: 12-12-1975

## 2020-03-11 ENCOUNTER — Ambulatory Visit: Payer: Medicaid Other | Admitting: Physical Therapy

## 2020-03-11 ENCOUNTER — Other Ambulatory Visit: Payer: Self-pay

## 2020-03-11 ENCOUNTER — Encounter: Payer: Self-pay | Admitting: Physical Therapy

## 2020-03-11 DIAGNOSIS — R252 Cramp and spasm: Secondary | ICD-10-CM

## 2020-03-11 DIAGNOSIS — R2689 Other abnormalities of gait and mobility: Secondary | ICD-10-CM

## 2020-03-11 DIAGNOSIS — M6281 Muscle weakness (generalized): Secondary | ICD-10-CM | POA: Diagnosis not present

## 2020-03-11 DIAGNOSIS — N39498 Other specified urinary incontinence: Secondary | ICD-10-CM

## 2020-03-11 NOTE — Therapy (Signed)
[facility] [address] [place], [place], [address] Phone: [phone]   Fax:  [phone]  Physical Therapy Treatment  Patient Details  Name: Tamara Osborne MRN: [id number] Date of Birth: 10/20/1975 Referring Provider (PT): Dr. Gery Pray   Encounter Date: 03/11/2020   PT End of Session - 03/11/20 1056    Visit Number 13    Date for PT Re-Evaluation 04/26/20    Authorization Type [employer]    Authorization - Visit Number 1    PT Start Time [id number]    PT Stop Time 1055    PT Time Calculation (min) 40 min    Activity Tolerance Patient tolerated treatment well;No increased pain    Behavior During Therapy WFL for tasks assessed/performed           Past Medical History:  Diagnosis Date  . Anemia   . Anxiety   . Bartholin cyst   . Cervical cancer ([place])   . Constipation   . Depression   . Deviated septum   . Diabetes mellitus without complication ([place])   . Difficult intravenous access    used port for 05-09-2019 surgery  . Fibroids   . History of blood transfusion    2 units given 04-26-2019, has had total of 8 units since jan 2021  . History of blood transfusion 1 unit given 05-30-19   iv fluids also given  . History of kidney problems   . History of recent blood transfusion 05/16/2019   2 units given  per dr Pollyann Savoy at cancer center  . Hypertension   . Neuropathy    both thumbs  . Obesity   . Palpitations   . PONV (postoperative nausea and vomiting)   . Prediabetes   . Preeclampsia 2006  . Sleep apnea    no cpap used insurance would not cover, osa severe per pt  . SOB (shortness of breath)   . Swallowing difficulty     Past Surgical History:  Procedure Laterality Date  . CESAREAN SECTION WITH BILATERAL TUBAL LIGATION  11/10/2004      . DILATION AND CURETTAGE OF UTERUS  1997  . IR IMAGING GUIDED PORT INSERTION  04/04/2019  . OPERATIVE ULTRASOUND N/A 05/09/2019   Procedure: OPERATIVE  ULTRASOUND;  Surgeon: Gery Pray, MD;  Location: [facility];  Service: Urology;  Laterality: N/A;  . OPERATIVE ULTRASOUND N/A 05/15/2019   Procedure: OPERATIVE ULTRASOUND;  Surgeon: Gery Pray, MD;  Location: [facility];  Service: Urology;  Laterality: N/A;  . OPERATIVE ULTRASOUND N/A 05/22/2019   Procedure: OPERATIVE ULTRASOUND;  Surgeon: Gery Pray, MD;  Location: [facility];  Service: Urology;  Laterality: N/A;  . OPERATIVE ULTRASOUND N/A 05/29/2019   Procedure: OPERATIVE ULTRASOUND;  Surgeon: Gery Pray, MD;  Location: [facility];  Service: Urology;  Laterality: N/A;  . OPERATIVE ULTRASOUND N/A 06/05/2019   Procedure: OPERATIVE ULTRASOUND;  Surgeon: Gery Pray, MD;  Location: [facility];  Service: Urology;  Laterality: N/A;  . TANDEM RING INSERTION N/A 05/09/2019   Procedure: TANDEM RING INSERTION;  Surgeon: Gery Pray, MD;  Location: [facility];  Service: Urology;  Laterality: N/A;  . TANDEM RING INSERTION N/A 05/15/2019   Procedure: TANDEM RING INSERTION;  Surgeon: Gery Pray, MD;  Location: [facility];  Service: Urology;  Laterality: N/A;  . TANDEM RING INSERTION N/A 05/22/2019   Procedure: TANDEM RING INSERTION;  Surgeon: Gery Pray, MD;  Location:  [place];  Service: Urology;  Laterality: N/A;  . TANDEM RING INSERTION N/A 05/29/2019   Procedure: TANDEM RING INSERTION;  Surgeon: Gery Pray, MD;  Location: [facility];  Service: Urology;  Laterality: N/A;  . TANDEM RING INSERTION N/A 06/05/2019   Procedure: TANDEM RING INSERTION;  Surgeon: Gery Pray, MD;  Location: [facility];  Service: Urology;  Laterality: N/A;    There were no vitals filed for this visit.   Subjective Assessment - 03/11/20 1019    Subjective I still on number 6 dilator. I have some swelling in more feet.    Pertinent History Cervical  cancer; radiation 03/23/2019-06/05/2019    Patient Stated Goals improve urinary incontinence, help with use of dilators    Currently in Pain? No/denies                             [facility] Adult PT Treatment/Exercise - 03/11/20 0001      Exercises   Other Exercises  Wall push ups for plank      Lumbar Exercises: Aerobic   Stationary Bike level 1 6 minutes while assessing patient      Lumbar Exercises: Standing   Other Standing Lumbar Exercises golfers lift holding onto the stairs 10 time each    Other Standing Lumbar Exercises stand pulling on green band opposite ways like arm swing for walking 20x each working on core and pelvic floor      Lumbar Exercises: Supine   Bent Knee Raise 20 reps;1 second    Bent Knee Raise Limitations while pulling red band downwaed    Bridge 10 reps    Bridge Limitations then pull the red band across the body 10x left, 10 right      Knee/Hip Exercises: Standing   Other Standing Knee Exercises sit to stand holding 10# weight 10x    Other Standing Knee Exercises dead lift holding 5# keep spinal netral                    PT Short Term Goals - 01/11/20 0948      PT SHORT TERM GOAL #1   Title independent with initial HEP    Time 4    Period Weeks    Status Achieved      PT SHORT TERM GOAL #2   Title educated on vaginal moisturizers to improve tissue integrity    Baseline educated    Time 4    Period Weeks    Status Achieved      PT SHORT TERM GOAL #3   Title educated on ways to use dilators to not increase abdominal pressure so they will stay in the vaginal canal    Baseline educated    Time 4    Period Weeks    Status Achieved             PT Long Term Goals - 03/11/20 1058      PT LONG TERM GOAL #1   Title independent with advanced exercises for strength, flexibility, and endurance    Baseline advance as she changes    Time 10    Period Weeks    Status On-going      PT LONG TERM GOAL #2   Title able to  use the largest dilator with no pain so she is able to having penile penetration with minimal to no difficulty    Baseline now on dilator  6 on the largest set she ust got    Time 10    Period Weeks    Status On-going      PT LONG TERM GOAL #3   Title urinary leakage decreased >/= 75% and down to 0-1 thin pad due to increased pelvic floor strength and increased tissue mobility    Baseline ---    Time 10    Period Weeks    Status Achieved      PT LONG TERM GOAL #4   Title able to walk for 20 minutes in the store and for exercise without having to go to the bathroom due to increased endrance and pelvic floor strength    Baseline ----    Time 10    Period Weeks    Status Achieved      PT LONG TERM GOAL #5   Title sit to stand </= 12 seconds due to improved strength and balance to reduce her risk of falls    Time 10    Status Achieved                 Plan - 03/11/20 1056    Clinical Impression Statement Patient continues to no be wearing pads. He leakage is significantly less. Patient is still on the same dilator size due to some pain still. Patient has not had penile penetration since the last time. Patient has been focusing on her core and balance in therapy. Patient will benefit from skilled therapy to increase pelvic floor outlet for penile penetration and increase her core strength.    Personal Factors and Comorbidities Age;Fitness;Comorbidity 3+;Sex;Time since onset of injury/illness/exacerbation    Comorbidities Cervical cancer 03/18/2019 with radiation 03/23/2019-06/05/2019; Diabetes; C-section 11/11/2014; nueropathy in toes    Examination-Activity Limitations Locomotion Level;Bed Mobility;Squat;Continence;Stairs;Stand;Lift;Toileting    Examination-Participation Restrictions Meal Prep;Cleaning;Community Activity;Interpersonal Relationship;Laundry    Stability/Clinical Decision Making Evolving/Moderate complexity    Rehab Potential Good    PT Duration 8 weeks    PT  Treatment/Interventions ADLs/Self Care Home Management;Biofeedback;Functional mobility training;Therapeutic activities;Therapeutic exercise;Balance training;Neuromuscular re-education;Manual techniques;Patient/family education;Dry needling;Energy conservation;Passive range of motion;Spinal Manipulations;Joint Manipulations    PT Next Visit Plan internal work, core strength with diagonals; balance    PT Home Exercise Plan Access Code: [email or web address]    Consulted and Agree with Plan of Care Patient           Patient will benefit from skilled therapeutic intervention in order to improve the following deficits and impairments:  Decreased coordination,Decreased range of motion,Difficulty walking,Increased fascial restricitons,Decreased endurance,Increased muscle spasms,Pain,Decreased activity tolerance,Decreased balance,Impaired flexibility,Decreased strength,Decreased mobility  Visit Diagnosis: Muscle weakness (generalized)  Cramp and spasm  Other abnormalities of gait and mobility  Other urinary incontinence     Problem List Patient Active Problem List   Diagnosis Date Noted  . Port-A-Cath in place 01/22/2020  . Mixed hyperlipidemia 01/10/2020  . Vitamin D deficiency 01/10/2020  . Insulin resistance 01/10/2020  . Class 3 severe obesity with serious comorbidity and body mass index (BMI) of 40.0 to 44.9 in adult [facility]) 01/10/2020  . Obesity, Class II, BMI 35-39.9 09/07/2019  . Dehydration 05/30/2019  . Peripheral neuropathy due to chemotherapy ([place]) 04/25/2019  . Diarrhea 04/04/2019  . CKD (chronic kidney disease), symptom management only, stage 3 (moderate) ([place]) 03/30/2019  . Generalized anxiety disorder 03/30/2019  . Essential hypertension   . Anxiety 03/23/2019  . Squamous cell carcinoma of cervix ([place]) 03/23/2019  . Deficiency anemia 03/22/2019  . Squamous cell carcinoma in situ (SCCIS) of cervix 03/22/2019  Earlie Counts, PT 03/11/20 11:00 AM   [facility] [address], [place], [place], [address] Phone: [phone]   Fax:  [phone]  Name: MAILYNN EVERLY MRN: [id number] Date of Birth: [birth year]

## 2020-03-11 NOTE — Progress Notes (Signed)
Chief Complaint:   OBESITY Tamara Osborne is here to discuss her progress with her obesity treatment plan along with follow-up of her obesity related diagnoses. [name] is on the Category 1 Plan and states she is following her eating plan approximately 90% of the time. [name] states she is doing physical therapy 60 minutes 2 times per week.  Today's visit was #: 11 Starting weight: 208 lbs Starting date: 09/20/2019 Today's weight: 217 lbs Today's date: 03/07/2020 Total lbs lost to date: 0 Total lbs lost since last in-office visit: 0  Interim History: Since she started physical therapy, [name] has increased stationary bike riding from 3 minutes to 9 minutes- great! She is still waiting delivery of AutoPAP that was ordered 12/11/2019. She had to push back neurology follow up to March since she has to start AutoPAP.  Subjective:   1. Insulin resistance [name] has a diagnosis of insulin resistance based on her elevated fasting insulin level >5. She continues to work on diet and exercise to decrease her risk of diabetes. She is on Victoza 1.8 mg daily. She denies mass in neck, dysphagia, dyspepsia, or persistent hoarseness.   Lab Results  Component Value Date   INSULIN 40.3 (H) 09/20/2019   Lab Results  Component Value Date   HGBA1C 5.2 10/30/2019    2. Essential hypertension [name]'s blood pressure is slightly elevated at the office today. She had therapy this morning and she reports feeling very nervous at her appointment this morning. She has taken daily amlodipine 10 mg and valsartan 160 mg.   BP Readings from Last 3 Encounters:  03/07/20 (!) 144/87  02/14/20 128/78  01/29/20 (!) 154/89    Assessment/Plan:   1. Insulin resistance [place]aceland will continue to work on weight loss, exercise, and decreasing simple carbohydrates to help decrease the risk of diabetes. [name] agreed to follow-up with [place] as directed to closely monitor her progress. Continue Victoza 1.8 mg  daily.  2. Essential hypertension [name] is working on healthy weight loss and exercise to improve blood pressure control. We will watch for signs of hypotension as she continues her lifestyle modifications. Decrease salt intake. Continue physical therapy and increase other exercise as tolerated. Continue amlodipine and valsartan.  3. Class 3 severe obesity with serious comorbidity and body mass index (BMI) of 40.0 to 44.9 in adult, unspecified obesity type (HCC) [name] is currently in the action stage of change. As such, her goal is to continue with weight loss efforts. She has agreed to the Category 1 Plan.   Exercise goals: As is  Behavioral modification strategies: increasing lean protein intake, decreasing simple carbohydrates, increasing water intake, meal planning and cooking strategies and planning for success.  [name] has agreed to follow-up with our clinic in 2 weeks. She was informed of the importance of frequent follow-up visits to maximize her success with intensive lifestyle modifications for her multiple health conditions.   Objective:   Blood pressure (!) 144/87, pulse 92, temperature 98.3 F (36.8 C), temperature source Oral, height 5\' 1"  (1.549 m), weight 217 lb (98.4 kg), SpO2 97 %. Body mass index is 41 kg/m.  General: Cooperative, alert, well developed, in no acute distress. HEENT: Conjunctivae and lids unremarkable. Cardiovascular: Regular rhythm.  Lungs: Normal work of breathing. Neurologic: No focal deficits.   Lab Results  Component Value Date   CREATININE 1.49 (H) 01/22/2020   BUN 19 01/22/2020   NA 137 01/22/2020   K 3.9 01/22/2020   CL 106 01/22/2020   CO2 23  01/22/2020   Lab Results  Component Value Date   ALT 28 01/22/2020   AST 17 01/22/2020   ALKPHOS 96 01/22/2020   BILITOT 0.4 01/22/2020   Lab Results  Component Value Date   HGBA1C 5.2 10/30/2019   HGBA1C CANCELED 09/20/2019   Lab Results  Component Value Date   INSULIN 40.3 (H)  09/20/2019   Lab Results  Component Value Date   TSH 1.910 09/20/2019   Lab Results  Component Value Date   CHOL 191 01/22/2020   HDL 31 (L) 01/22/2020   LDLCALC 138 (H) 01/22/2020   TRIG 111 01/22/2020   CHOLHDL 6.2 01/22/2020   Lab Results  Component Value Date   WBC 6.6 08/07/2019   HGB 11.8 (L) 08/07/2019   HCT 34.8 (L) 08/07/2019   MCV 87.7 08/07/2019   PLT 267 08/07/2019   Lab Results  Component Value Date   IRON 17 (L) 03/27/2019   TIBC 283 03/27/2019   FERRITIN 32 03/27/2019    Attestation Statements:   Reviewed by clinician on day of visit: allergies, medications, problem list, medical history, surgical history, family history, social history, and previous encounter notes.  Time spent on visit including pre-visit chart review and post-visit care and charting was 29 minutes.   Coral Ceo, am acting as Location manager for Mina Marble, NP.  I have reviewed the above documentation for accuracy and completeness, and I agree with the above. -  Joie Reamer d. Malon Branton, NP-C

## 2020-03-12 ENCOUNTER — Ambulatory Visit: Payer: Self-pay | Admitting: Neurology

## 2020-03-13 ENCOUNTER — Encounter: Payer: Self-pay | Admitting: Physical Therapy

## 2020-03-13 ENCOUNTER — Ambulatory Visit: Payer: Medicaid Other | Admitting: Physical Therapy

## 2020-03-13 ENCOUNTER — Other Ambulatory Visit: Payer: Self-pay

## 2020-03-13 DIAGNOSIS — N39498 Other specified urinary incontinence: Secondary | ICD-10-CM

## 2020-03-13 DIAGNOSIS — M6281 Muscle weakness (generalized): Secondary | ICD-10-CM | POA: Diagnosis not present

## 2020-03-13 DIAGNOSIS — R252 Cramp and spasm: Secondary | ICD-10-CM

## 2020-03-13 DIAGNOSIS — R2689 Other abnormalities of gait and mobility: Secondary | ICD-10-CM

## 2020-03-13 NOTE — Therapy (Signed)
[facility] [address], [place], [place], [address] Phone: [phone]   Fax:  [phone]  Physical Therapy Treatment  Patient Details  Name: Tamara Osborne MRN: [id number] Date of Birth: 07/10/1975 Referring Provider (PT): Dr. Gery Pray   Encounter Date: 03/13/2020   PT End of Session - 03/13/20 1022    Visit Number 14    Date for PT Re-Evaluation 04/26/20    Authorization Type [employer]    Authorization - Visit Number 3    Authorization - Number of Visits 3    PT Start Time 8841    PT Stop Time 1055    PT Time Calculation (min) 40 min    Activity Tolerance Patient tolerated treatment well;No increased pain    Behavior During Therapy WFL for tasks assessed/performed           Past Medical History:  Diagnosis Date  . Anemia   . Anxiety   . Bartholin cyst   . Cervical cancer ([facility])   . Constipation   . Depression   . Deviated septum   . Diabetes mellitus without complication ([place])   . Difficult intravenous access    used port for 05-09-2019 surgery  . Fibroids   . History of blood transfusion    2 units given 04-26-2019, has had total of 8 units since jan 2021  . History of blood transfusion 1 unit given 05-30-19   iv fluids also given  . History of kidney problems   . History of recent blood transfusion 05/16/2019   2 units given  per dr Pollyann Savoy at cancer center  . Hypertension   . Neuropathy    both thumbs  . Obesity   . Palpitations   . PONV (postoperative nausea and vomiting)   . Prediabetes   . Preeclampsia 2006  . Sleep apnea    no cpap used insurance would not cover, osa severe per pt  . SOB (shortness of breath)   . Swallowing difficulty     Past Surgical History:  Procedure Laterality Date  . CESAREAN SECTION WITH BILATERAL TUBAL LIGATION  11/10/2004      . DILATION AND CURETTAGE OF UTERUS  1997  . IR IMAGING GUIDED PORT INSERTION  04/04/2019  . OPERATIVE ULTRASOUND N/A  05/09/2019   Procedure: OPERATIVE ULTRASOUND;  Surgeon: Gery Pray, MD;  Location: [facility];  Service: Urology;  Laterality: N/A;  . OPERATIVE ULTRASOUND N/A 05/15/2019   Procedure: OPERATIVE ULTRASOUND;  Surgeon: Gery Pray, MD;  Location: [facility];  Service: Urology;  Laterality: N/A;  . OPERATIVE ULTRASOUND N/A 05/22/2019   Procedure: OPERATIVE ULTRASOUND;  Surgeon: Gery Pray, MD;  Location: [facility];  Service: Urology;  Laterality: N/A;  . OPERATIVE ULTRASOUND N/A 05/29/2019   Procedure: OPERATIVE ULTRASOUND;  Surgeon: Gery Pray, MD;  Location: [facility];  Service: Urology;  Laterality: N/A;  . OPERATIVE ULTRASOUND N/A 06/05/2019   Procedure: OPERATIVE ULTRASOUND;  Surgeon: Gery Pray, MD;  Location[facility];  Service: Urology;  Laterality: N/A;  . TANDEM RING INSERTION N/A 05/09/2019   Procedure: TANDEM RING INSERTION;  Surgeon: Gery Pray, MD;  Location: [facility];  Service: Urology;  Laterality: N/A;  . TANDEM RING INSERTION N/A 05/15/2019   Procedure: TANDEM RING INSERTION;  Surgeon: Gery Pray, MD;  Location: [facility];  Service: Urology;  Laterality: N/A;  . TANDEM RING INSERTION N/A 05/22/2019   Procedure: TANDEM  RING INSERTION;  Surgeon: Gery Pray, MD;  Location: [facility];  Service: Urology;  Laterality: N/A;  . TANDEM RING INSERTION N/A 05/29/2019   Procedure: TANDEM RING INSERTION;  Surgeon: Gery Pray, MD;  Location: [facility];  Service: Urology;  Laterality: N/A;  . TANDEM RING INSERTION N/A 06/05/2019   Procedure: TANDEM RING INSERTION;  Surgeon: Gery Pray, MD;  Location: [facility];  Service: Urology;  Laterality: N/A;    There were no vitals filed for this visit.   Subjective Assessment - 03/13/20 1021    Subjective I felt good after last visit. I am doing my exercises at home. I  have a tremor on the right side and I dorpped my biscuit.    Pertinent History Cervical cancer; radiation 03/23/2019-06/05/2019    Patient Stated Goals improve urinary incontinence, help with use of dilators    Currently in Pain? No/denies              [facility] PT Assessment - 03/13/20 0001      Assessment   Medical Diagnosis C53.9 Squamous cell carcinoma of cervix    Referring Provider (PT) Dr. Gery Pray    Onset Date/Surgical Date 03/18/19    Prior Therapy none      Precautions   Precautions Other (comment)    Precaution Comments cervical cancer with radiation      Restrictions   Weight Bearing Restrictions No      [place] residence      ROM / Strength   AROM / PROM / Strength AROM;PROM;Strength      PROM   Right Hip Internal Rotation  15    Left Hip Internal Rotation  15      Strength   Overall Strength Comments PF strength is 3/5 bil.                      Pelvic Floor Special Questions - 03/13/20 0001    Pelvic Floor Internal Exam Patient confirmed identification and approves PT to assess pelvic floor and treatment    Exam Type Vaginal    Strength fair squeeze, definite lift             [facility] Adult PT Treatment/Exercise - 03/13/20 0001      Lumbar Exercises: Stretches   Gastroc Stretch Right;Left;1 rep;30 seconds    Gastroc Stretch Limitations on step      Lumbar Exercises: Aerobic   Stationary Bike level 1 6 minutes while assessing patient      Lumbar Exercises: Standing   Shoulder Extension Strengthening;Both;20 reps;Theraband    Theraband Level (Shoulder Extension) Level 2 (Red)    Other Standing Lumbar Exercises Palof with double red band spilt stance 20x each side    Other Standing Lumbar Exercises stand pulling on green band opposite ways like arm swing for walking 20x each working on core and pelvic floor      Manual Therapy   Manual Therapy Internal Pelvic Floor    Internal Pelvic Floor manual  work to bilateral bulbocavernosus, perineal body, obturator internist, and levator ani                    PT Short Term Goals - 01/11/20 0948      PT SHORT TERM GOAL #1   Title independent with initial HEP    Time 4    Period Weeks    Status Achieved  PT SHORT TERM GOAL #2   Title educated on vaginal moisturizers to improve tissue integrity    Baseline educated    Time 4    Period Weeks    Status Achieved      PT SHORT TERM GOAL #3   Title educated on ways to use dilators to not increase abdominal pressure so they will stay in the vaginal canal    Baseline educated    Time 4    Period Weeks    Status Achieved             PT Long Term Goals - 03/11/20 1058      PT LONG TERM GOAL #1   Title independent with advanced exercises for strength, flexibility, and endurance    Baseline advance as she changes    Time 10    Period Weeks    Status On-going      PT LONG TERM GOAL #2   Title able to use the largest dilator with no pain so she is able to having penile penetration with minimal to no difficulty    Baseline now on dilator 6 on the largest set she ust got    Time 10    Period Weeks    Status On-going      PT LONG TERM GOAL #3   Title urinary leakage decreased >/= 75% and down to 0-1 thin pad due to increased pelvic floor strength and increased tissue mobility    Baseline ---    Time 10    Period Weeks    Status Achieved      PT LONG TERM GOAL #4   Title able to walk for 20 minutes in the store and for exercise without having to go to the bathroom due to increased endrance and pelvic floor strength    Baseline ----    Time 10    Period Weeks    Status Achieved      PT LONG TERM GOAL #5   Title sit to stand </= 12 seconds due to improved strength and balance to reduce her risk of falls    Time 10    Status Achieved                 Plan - 03/13/20 1023    Clinical Impression Statement Patient is not wearing pads due to reduction with  urinary leakage. Patient is on the 6th vaginal dilator with some pain. Patient has not had penile penetration in several weeks to see about the comfort level. Patient is doing core exercises to work on balance and pelvic floor strength. Patient has difficulty with her memory due to the chemotherapy and needs exercies to be reviewed. Patient is able to contract her abdominals. Patient will benefit from skilled therapy to increase pelvic floor outlet for penile penetration and increase her core strength.    Personal Factors and Comorbidities Age;Fitness;Comorbidity 3+;Sex;Time since onset of injury/illness/exacerbation    Comorbidities Cervical cancer 03/18/2019 with radiation 03/23/2019-06/05/2019; Diabetes; C-section 11/11/2014; nueropathy in toes    Examination-Activity Limitations Locomotion Level;Bed Mobility;Squat;Continence;Stairs;Stand;Lift;Toileting    Examination-Participation Restrictions Meal Prep;Cleaning;Community Activity;Interpersonal Relationship;Laundry    Stability/Clinical Decision Making Evolving/Moderate complexity    Rehab Potential Good    PT Frequency 2x / week    PT Duration 8 weeks    PT Treatment/Interventions ADLs/Self Care Home Management;Biofeedback;Functional mobility training;Therapeutic activities;Therapeutic exercise;Balance training;Neuromuscular re-education;Manual techniques;Patient/family education;Dry needling;Energy conservation;Passive range of motion;Spinal Manipulations;Joint Manipulations    PT Next Visit Plan internal work, core strength  with diagonals; balance    PT Home Exercise Plan Access Code: [email or web address]    Consulted and Agree with Plan of Care Patient           Patient will benefit from skilled therapeutic intervention in order to improve the following deficits and impairments:  Decreased coordination,Decreased range of motion,Difficulty walking,Increased fascial restricitons,Decreased endurance,Increased muscle spasms,Pain,Decreased activity  tolerance,Decreased balance,Impaired flexibility,Decreased strength,Decreased mobility  Visit Diagnosis: Muscle weakness (generalized)  Cramp and spasm  Other abnormalities of gait and mobility  Other urinary incontinence     Problem List Patient Active Problem List   Diagnosis Date Noted  . Port-A-Cath in place 01/22/2020  . Mixed hyperlipidemia 01/10/2020  . Vitamin D deficiency 01/10/2020  . Insulin resistance 01/10/2020  . Class 3 severe obesity with serious comorbidity and body mass index (BMI) of 40.0 to 44.9 in adult [facility]) 01/10/2020  . Obesity, Class II, BMI 35-39.9 09/07/2019  . Dehydration 05/30/2019  . Peripheral neuropathy due to chemotherapy ([place]) 04/25/2019  . Diarrhea 04/04/2019  . CKD (chronic kidney disease), symptom management only, stage 3 (moderate) ([place]) 03/30/2019  . Generalized anxiety disorder 03/30/2019  . Essential hypertension   . Anxiety 03/23/2019  . Squamous cell carcinoma of cervix ([place]) 03/23/2019  . Deficiency anemia 03/22/2019  . Squamous cell carcinoma in situ (SCCIS) of cervix 03/22/2019    Earlie Counts, PT 03/13/20 3:09 PM   [facility] [address] [place], [place], [address] Phone: [phone]   Fax:  [phone]  Name: DARIANA ELLINGBOE MRN: [id number] Date of Birth: 1975-06-13

## 2020-03-19 ENCOUNTER — Encounter (INDEPENDENT_AMBULATORY_CARE_PROVIDER_SITE_OTHER): Payer: Self-pay | Admitting: Adult Health

## 2020-03-20 ENCOUNTER — Encounter (INDEPENDENT_AMBULATORY_CARE_PROVIDER_SITE_OTHER): Payer: Self-pay | Admitting: Adult Health

## 2020-03-20 ENCOUNTER — Other Ambulatory Visit: Payer: Self-pay

## 2020-03-20 ENCOUNTER — Telehealth (INDEPENDENT_AMBULATORY_CARE_PROVIDER_SITE_OTHER): Payer: Medicaid Other | Admitting: Adult Health

## 2020-03-20 DIAGNOSIS — E8881 Metabolic syndrome: Secondary | ICD-10-CM

## 2020-03-20 DIAGNOSIS — Z6841 Body Mass Index (BMI) 40.0 and over, adult: Secondary | ICD-10-CM | POA: Diagnosis not present

## 2020-03-20 DIAGNOSIS — I10 Essential (primary) hypertension: Secondary | ICD-10-CM

## 2020-03-21 ENCOUNTER — Telehealth: Payer: Self-pay | Admitting: *Deleted

## 2020-03-21 ENCOUNTER — Encounter: Payer: Self-pay | Admitting: Gynecologic Oncology

## 2020-03-21 NOTE — Progress Notes (Signed)
TeleHealth Visit:  Due to the COVID-19 pandemic, this visit was completed with telemedicine (audio/video) technology to reduce patient and provider exposure as well as to preserve personal protective equipment.   Tamara Osborne has verbally consented to this TeleHealth visit. The patient is located at home, the provider is located at the [facility] office. The participants in this visit include the listed provider and patient. The visit was conducted today via video.   Chief Complaint: OBESITY Tamara Osborne is here to discuss her progress with her obesity treatment plan along with follow-up of her obesity related diagnoses. Tamara Osborne is on the Category 1 Plan and states she is following her eating plan approximately 90% of the time. Tamara Osborne states she is doing physical therapy 60 minutes 1 times per week.  Today's visit was #: 12 Starting weight: 208 lbs Starting date: 09/20/2019  Interim History: Tamara Osborne is experiencing nasal congestion and fatigue over the last weekend. She tested at home and antigen test 03/16/2020 was negative. She tested again today 03/20/2020 with a negative result again. She reports symptoms are steadily improving. She is on Victoza 1.8 mg daily. She denies mass in neck, persistent hoarseness, dysphagia, dyspepsia. She denies GI upset. She has been on Victoza since 12/2019.  Subjective:   1. Insulin resistance Tamara Osborne has a diagnosis of insulin resistance based on her elevated fasting insulin level >5. She continues to work on diet and exercise to decrease her risk of diabetes. 01/22/2020 blood glucose 131, insulin level 143. Both are well above goal. 01/22/2020 CMP GFR 44. Tamara Osborne is on Victoza 1.8 mg daily.  Lab Results  Component Value Date   INSULIN 40.3 (H) 09/20/2019   Lab Results  Component Value Date   HGBA1C 5.2 10/30/2019    2. Essential hypertension Tamara Osborne denies cardiac symptoms. She experiences lower weight edema when she is not  drinking enough water or when she is inactive. She is on amlodipine 10 mg and valsartan 160 mg.  BP Readings from Last 3 Encounters:  03/07/20 (!) 144/87  02/14/20 128/78  01/29/20 (!) 154/89    Assessment/Plan:   1. Insulin resistance Tamara Osborne will continue to work on weight loss, exercise, and decreasing simple carbohydrates to help decrease the risk of diabetes. Tamara Osborne agreed to follow-up with [place] as directed to closely monitor her progress. Continue GLP-1 and Category 1 meal plan.  2. Essential hypertension Tamara Osborne is working on healthy weight loss and exercise to improve blood pressure control. We will watch for signs of hypotension as she continues her lifestyle modifications. Continue current anti-hypertensives. Increase water intake, elevate feet when seated and increase daily activity. OTC compression socks.  3. Class 3 severe obesity with serious comorbidity and body mass index (BMI) of 40.0 to 44.9 in adult, unspecified obesity type (HCC) Tamara Osborne is currently in the action stage of change. As such, her goal is to continue with weight loss efforts. She has agreed to the Category 1 Plan.   Exercise goals: As is  Behavioral modification strategies: increasing lean protein intake, meal planning and cooking strategies and planning for success.  Tamara Osborne has agreed to follow-up with our clinic in 2 weeks on 04/03/2020 at 10:00 AM. She was informed of the importance of frequent follow-up visits to maximize her success with intensive lifestyle modifications for her multiple health conditions.  Objective:   VITALS: Per patient if applicable, see vitals. GENERAL: Alert and in no acute distress. CARDIOPULMONARY: No increased WOB. Speaking in clear sentences.  PSYCH: Pleasant and cooperative. Speech  normal rate and rhythm. Affect is appropriate. Insight and judgement are appropriate. Attention is focused, linear, and appropriate.  NEURO: Oriented as arrived to appointment on time with  no prompting.   Lab Results  Component Value Date   CREATININE 1.49 (H) 01/22/2020   BUN 19 01/22/2020   NA 137 01/22/2020   K 3.9 01/22/2020   CL 106 01/22/2020   CO2 23 01/22/2020   Lab Results  Component Value Date   ALT 28 01/22/2020   AST 17 01/22/2020   ALKPHOS 96 01/22/2020   BILITOT 0.4 01/22/2020   Lab Results  Component Value Date   HGBA1C 5.2 10/30/2019   HGBA1C CANCELED 09/20/2019   Lab Results  Component Value Date   INSULIN 40.3 (H) 09/20/2019   Lab Results  Component Value Date   TSH 1.910 09/20/2019   Lab Results  Component Value Date   CHOL 191 01/22/2020   HDL 31 (L) 01/22/2020   LDLCALC 138 (H) 01/22/2020   TRIG 111 01/22/2020   CHOLHDL 6.2 01/22/2020   Lab Results  Component Value Date   WBC 6.6 08/07/2019   HGB 11.8 (L) 08/07/2019   HCT 34.8 (L) 08/07/2019   MCV 87.7 08/07/2019   PLT 267 08/07/2019   Lab Results  Component Value Date   IRON 17 (L) 03/27/2019   TIBC 283 03/27/2019   FERRITIN 32 03/27/2019    Attestation Statements:   Reviewed by clinician on day of visit: allergies, medications, problem list, medical history, surgical history, family history, social history, and previous encounter notes.  Time spent on visit including pre-visit chart review and post-visit charting and care was 32 minutes.   Coral Ceo, am acting as Location manager for Mina Marble, NP.  I have reviewed the above documentation for accuracy and completeness, and I agree with the above. - Tiffny Gemmer d. Jaidah Lomax, NP-C

## 2020-03-21 NOTE — Telephone Encounter (Signed)
Moved the patients flush appt to closer to her MD visit

## 2020-03-22 ENCOUNTER — Telehealth: Payer: Self-pay | Admitting: *Deleted

## 2020-03-22 ENCOUNTER — Ambulatory Visit: Payer: Medicaid Other | Admitting: Gynecologic Oncology

## 2020-03-22 ENCOUNTER — Encounter: Payer: Self-pay | Admitting: Gynecologic Oncology

## 2020-03-22 DIAGNOSIS — C539 Malignant neoplasm of cervix uteri, unspecified: Secondary | ICD-10-CM

## 2020-03-22 NOTE — Telephone Encounter (Signed)
Patient called and moved her appt from today to Monday.

## 2020-03-23 NOTE — Progress Notes (Signed)
Gynecologic Oncology Return Clinic Visit  03/23/20  Reason for Visit: surveillance visit in the setting of stage II SCC of the cervix  Treatment History: Oncology History  Squamous cell carcinoma of cervix ([place])  03/21/2019 Pathology Results   The biopsy is superficial and therefore depth of invasion cannot be  determined.  There is at least carcinoma in-situ.    03/23/2019 Initial Diagnosis   Squamous cell carcinoma of cervix ([place])   03/23/2019 Pathology Results   CERVIX, 11 O CLOCK, BIOPSY:  -  Squamous cell carcinoma, invasive  -  See comment   03/23/2019 - 06/05/2019 Radiation Therapy   External beam therapy was from 03/23/2019 - 05/04/2019. HDR vaginal brachytherapy was from 05/19/2019 - 06/05/2019.   Site Technique Total Dose (Gy) Dose per Fx (Gy) Completed Fx Beam Energies  Cervix: Cervix HDR-brachy 5.5/5.5 5.5 1/1 Ir-192  Cervix: Cervix HDR-brachy 5.5/5.5 5.5 1/1 Ir-192  Cervix: Cervix_Bst HDR-brachy 5.5/5.5 5.5 1/1 Ir-192  Cervix: Cervix HDR-brachy 5.5/5.5 5.5 1/1 Ir-192  Cervix: Cervix 3D 45/45 1.8 25/25 10X, 15X  Cervix: Cervix HDR-brachy 5.5/5.5 5.5 1/1 Ir-192  Cervix: Cervix_Bst 3D 9/9 1.8 5/5 15X      03/28/2019 PET scan   1. Hypermetabolic cervical mass measures roughly 7.6 by 6.9 by 4.9 cm, compatible with malignancy. No adenopathy or distant metastatic spread identified. 2. Extending cephalad and anterior to the uterine fundus, there is a 450 cubic cm simple fluid density structure without hypermetabolic activity. This may represent a right adnexal cyst (favored), peritoneal inclusion cyst, or (if the patient has a remote history of pancreatitis) a remote pseudocyst. 3. Faint ground density nodularity in both lower lobes which could be from atypical infection or extrinsic allergic alveolitis. Given nodular appearance of some of this ground-glass density, I would recommend follow up chest CT in 3 months time in order to ensure clearance. 4. Moderate cardiomegaly, cause  uncertain. The patient also has advanced for age/gender atherosclerosis. Aortic Atherosclerosis (ICD10-I70.0).     04/04/2019 Procedure   Successful placement of a right internal jugular approach power injectable Port-A-Cath. The catheter is ready for immediate use   04/07/2019 - 05/12/2019 Chemotherapy   The patient had weekly cisplatin for chemotherapy treatment.     09/06/2019 PET scan   1. No evidence residual hypermetabolic cervical tissue. 2. No evidence of metastatic cervical carcinoma. 3. Large benign cystic mass unchanged in the upper pelvis.         Interval History: The patient has done well since her last visit. She denies any vaginal bleeding or discharge. She has struggled with some vaginal vulvar dryness for which she is using natural lubricants, such as coconut lotion and oil. She has been going to physical therapy and has been able to increase the size of her vaginal dilators. She had intercourse once around Thanksgiving and had some pain with this. She continues to have some spotting with the use of dilators.  She continues to see the weight management clinic and is frustrated because she has not lost any weight despite feeling like she is made significant changes to her activity level as well as her diet. She also noticed lesion on her buttocks that she has gotten three or four times since finishing treatment that is associated with pruritus, denies associated pain. She feels like this area drains purulent fluid.  Past Medical/Surgical History: Past Medical History:  Diagnosis Date  . Anemia   . Anxiety   . Bartholin cyst   . Cervical cancer ([place])   .  Constipation   . Depression   . Deviated septum   . Diabetes mellitus without complication ([place])   . Difficult intravenous access    used port for 05-09-2019 surgery  . Fibroids   . History of blood transfusion    2 units given 04-26-2019, has had total of 8 units since jan 2021  . History of blood transfusion 1 unit  given 05-30-19   iv fluids also given  . History of kidney problems   . History of recent blood transfusion 05/16/2019   2 units given  per dr Pollyann Savoy at cancer center  . Hypertension   . Neuropathy    both thumbs  . Obesity   . Palpitations   . PONV (postoperative nausea and vomiting)   . Prediabetes   . Preeclampsia 2006  . Sleep apnea    no cpap used insurance would not cover, osa severe per pt  . SOB (shortness of breath)   . Swallowing difficulty     Past Surgical History:  Procedure Laterality Date  . CESAREAN SECTION WITH BILATERAL TUBAL LIGATION  11/10/2004      . DILATION AND CURETTAGE OF UTERUS  1997  . IR IMAGING GUIDED PORT INSERTION  04/04/2019  . OPERATIVE ULTRASOUND N/A 05/09/2019   Procedure: OPERATIVE ULTRASOUND;  Surgeon: Gery Pray, MD;  Location: [facility];  Service: Urology;  Laterality: N/A;  . OPERATIVE ULTRASOUND N/A 05/15/2019   Procedure: OPERATIVE ULTRASOUND;  Surgeon: Gery Pray, MD;  Location: [facility];  Service: Urology;  Laterality: N/A;  . OPERATIVE ULTRASOUND N/A 05/22/2019   Procedure: OPERATIVE ULTRASOUND;  Surgeon: Gery Pray, MD;  Location: [facility];  Service: Urology;  Laterality: N/A;  . OPERATIVE ULTRASOUND N/A 05/29/2019   Procedure: OPERATIVE ULTRASOUND;  Surgeon: Gery Pray, MD;  Location: [facility];  Service: Urology;  Laterality: N/A;  . OPERATIVE ULTRASOUND N/A 06/05/2019   Procedure: OPERATIVE ULTRASOUND;  Surgeon: Gery Pray, MD;  Location: [facility];  Service: Urology;  Laterality: N/A;  . TANDEM RING INSERTION N/A 05/09/2019   Procedure: TANDEM RING INSERTION;  Surgeon: Gery Pray, MD;  Location: [facility];  Service: Urology;  Laterality: N/A;  . TANDEM RING INSERTION N/A 05/15/2019   Procedure: TANDEM RING INSERTION;  Surgeon: Gery Pray, MD;  Location: [facility];  Service: Urology;  Laterality: N/A;   . TANDEM RING INSERTION N/A 05/22/2019   Procedure: TANDEM RING INSERTION;  Surgeon: Gery Pray, MD;  Location: [facility];  Service: Urology;  Laterality: N/A;  . TANDEM RING INSERTION N/A 05/29/2019   Procedure: TANDEM RING INSERTION;  Surgeon: Gery Pray, MD;  Location: [facility];  Service: Urology;  Laterality: N/A;  . TANDEM RING INSERTION N/A 06/05/2019   Procedure: TANDEM RING INSERTION;  Surgeon: Gery Pray, MD;  Location: [facility];  Service: Urology;  Laterality: N/A;    Family History  Problem Relation Age of Onset  . Brain cancer Mother        lung cancer to brain  . Hypertension Mother   . Cancer Mother   . Depression Mother   . Anxiety disorder Mother   . Diabetes Father   . Kidney disease Father   . Cancer Father        kidney cancer  . Heart failure Sister     Social History   Socioeconomic History  . Marital status: Single    Spouse name: Not on  file  . Number of children: 4  . Years of education: Not on file  . Highest education level: Not on file  Occupational History  . Not on file  Tobacco Use  . Smoking status: Never Smoker  . Smokeless tobacco: Never Used  Vaping Use  . Vaping Use: Never used  Substance and Sexual Activity  . Alcohol use: No  . Drug use: No  . Sexual activity: Not Currently    Birth control/protection: Surgical  Other Topics Concern  . Not on file  Social History Narrative   Lives with her boyfriend   Social Determinants of Health   Financial Resource Strain: Not on file  Food Insecurity: Not on file  Transportation Needs: Not on file  Physical Activity: Not on file  Stress: Not on file  Social Connections: Not on file    Current Medications:  Current Outpatient Medications:  .  ASHWAGANDHA PO, Take by mouth at bedtime. [place] Gummies, Disp: , Rfl:  .  Cholecalciferol (VITAMIN D3) 50 MCG (2000 UT) TABS, Take 2,000 Units by mouth daily., Disp: , Rfl:  .   fluticasone (FLONASE) 50 MCG/ACT nasal spray, Place 1 spray into both nostrils daily as needed for allergies or rhinitis. , Disp: , Rfl:  .  Insulin Pen Needle (BD PEN NEEDLE NANO 2ND GEN) 32G X 4 MM MISC, 1 Package by Does not apply route 2 (two) times daily. (Patient taking differently: 1 Package by Does not apply route daily after supper.), Disp: 100 each, Rfl: 0 .  liraglutide (VICTOZA) 18 MG/3ML SOPN, Inject 1.8 mg into the skin daily., Disp: 9 mL, Rfl: 0 .  mupirocin ointment (BACTROBAN) 2 %, 1 application 3 (three) times daily., Disp: , Rfl:  .  terbinafine (LAMISIL) 250 MG tablet, Take 250 mg by mouth daily., Disp: , Rfl:  .  valsartan (DIOVAN) 160 MG tablet, Take 160 mg by mouth daily., Disp: , Rfl:  .  amLODipine (NORVASC) 10 MG tablet, Take 1 tablet (10 mg total) by mouth daily., Disp: 90 tablet, Rfl: 0  Review of Systems: Denies appetite changes, fevers, chills, fatigue, unexplained weight changes. Denies hearing loss, neck lumps or masses, mouth sores, ringing in ears or voice changes. Denies cough or wheezing.  Denies shortness of breath. Denies chest pain or palpitations. Denies leg swelling. Denies abdominal distention, pain, blood in stools, constipation, diarrhea, nausea, vomiting, or early satiety. Denies pain with intercourse, dysuria, frequency, hematuria or incontinence. Denies hot flashes, pelvic pain, vaginal bleeding or vaginal discharge.   Denies joint pain, back pain or muscle pain/cramps. Denies itching, rash, or wounds. Denies dizziness, headaches, numbness or seizures. Denies swollen lymph nodes or glands, denies easy bruising or bleeding. Denies anxiety, depression, confusion, or decreased concentration.  Physical Exam: BP (!) 158/98 (BP Location: Left Arm, Patient Position: Sitting)   Pulse (!) 101   Temp 97.8 F (36.6 C) (Oral)   Resp 18   Ht 5\' 2"  (1.575 m)   Wt 225 lb (102.1 kg)   SpO2 99% Comment: RA  BMI 41.15 kg/m  General: Alert, oriented, no  acute distress. HEENT: Atraumatic, normocephalic, sclera anicteric. Chest: Unlabored breathing on room air. Abdomen: Obese, soft, nontender.  Normoactive bowel sounds.  No masses or hepatosplenomegaly appreciated.   Extremities: Grossly normal range of motion.  Warm, well perfused.  No edema bilaterally. Skin: Several lesions on her right arm and hand that appear mildly verrucous.  Additional approximately 1 cm area with some covered blisters on her left buttock.  Lymphatics: No cervical, supraclavicular, or inguinal adenopathy. GU: Normal appearing external genitalia without erythema, excoriation, or lesions.  Speculum exam reveals atrophic appearing vaginal mucosa and cervix which is flush with the vaginal apex with significant radiation changes.  No lesions noted.  No nodularity appreciated on rectovaginal exam.  Laboratory & Radiologic Studies: Cervical biopsy on 12/14/19: A. CERVIX, BIOPSY:  - Erosive cervicitis with reactive changes  - Negative for dysplasia or malignancy   Assessment & Plan: Tamara Osborne is a 45 y.o. woman with Stage IIB SCC who presents for surveillance visit.  Patient is NED on exam today and overall doing well.  In terms of surveillance, we discussed surveillance visits every 3 months alternating between myself and radiation oncology.  She is scheduled to see radiation oncology in April. She will see me back again in July and we will do a Pap smear at that visit.. We reviewed signs and symptoms that would be concerning for disease recurrence and the patient knows to call if she develops any of these sooner.  We talked about the patient's frustration in terms of weight loss.  She is being seen at the weight management clinic.  She and I talked about some additional strategies and that may be the use of an application or a program such as weight watchers might be helpful for her.  I praised her on her continued commitment to weight loss as well as to pelvic physical  therapy.  Overall, she has found this helpful.  In terms of the lesion on her buttock, it has the appearance somewhat of healing HSV.  Her description of no pain and only pruritus though does not fit in line with a typical HSV outbreak.  I have asked her to call me if she develops another one of these lesions we can either empirically try to treat as herpes or she can come in to have one of the blisters swabbed.  45 minutes of total time was spent for this patient encounter, including preparation, face-to-face counseling with the patient and coordination of care, and documentation of the encounter.  Jeral Pinch, MD  Division of Gynecologic Oncology  Department of Obstetrics and Gynecology  [facility]

## 2020-03-25 ENCOUNTER — Inpatient Hospital Stay (HOSPITAL_BASED_OUTPATIENT_CLINIC_OR_DEPARTMENT_OTHER): Payer: Medicaid Other | Admitting: Gynecologic Oncology

## 2020-03-25 ENCOUNTER — Other Ambulatory Visit: Payer: Self-pay

## 2020-03-25 ENCOUNTER — Encounter: Payer: Self-pay | Admitting: Gynecologic Oncology

## 2020-03-25 ENCOUNTER — Inpatient Hospital Stay: Payer: Medicaid Other | Attending: Hematology and Oncology

## 2020-03-25 VITALS — BP 158/98 | HR 101 | Temp 97.8°F | Resp 18 | Ht 62.0 in | Wt 225.0 lb

## 2020-03-25 DIAGNOSIS — E119 Type 2 diabetes mellitus without complications: Secondary | ICD-10-CM | POA: Insufficient documentation

## 2020-03-25 DIAGNOSIS — Z08 Encounter for follow-up examination after completed treatment for malignant neoplasm: Secondary | ICD-10-CM | POA: Insufficient documentation

## 2020-03-25 DIAGNOSIS — Z794 Long term (current) use of insulin: Secondary | ICD-10-CM | POA: Insufficient documentation

## 2020-03-25 DIAGNOSIS — L989 Disorder of the skin and subcutaneous tissue, unspecified: Secondary | ICD-10-CM | POA: Diagnosis not present

## 2020-03-25 DIAGNOSIS — Z8541 Personal history of malignant neoplasm of cervix uteri: Secondary | ICD-10-CM | POA: Insufficient documentation

## 2020-03-25 DIAGNOSIS — Z452 Encounter for adjustment and management of vascular access device: Secondary | ICD-10-CM | POA: Diagnosis not present

## 2020-03-25 DIAGNOSIS — Z923 Personal history of irradiation: Secondary | ICD-10-CM | POA: Diagnosis not present

## 2020-03-25 DIAGNOSIS — C539 Malignant neoplasm of cervix uteri, unspecified: Secondary | ICD-10-CM

## 2020-03-25 DIAGNOSIS — Z79899 Other long term (current) drug therapy: Secondary | ICD-10-CM | POA: Insufficient documentation

## 2020-03-25 DIAGNOSIS — I10 Essential (primary) hypertension: Secondary | ICD-10-CM | POA: Insufficient documentation

## 2020-03-25 DIAGNOSIS — E669 Obesity, unspecified: Secondary | ICD-10-CM | POA: Diagnosis not present

## 2020-03-25 DIAGNOSIS — Z95828 Presence of other vascular implants and grafts: Secondary | ICD-10-CM

## 2020-03-25 DIAGNOSIS — Z9221 Personal history of antineoplastic chemotherapy: Secondary | ICD-10-CM | POA: Diagnosis not present

## 2020-03-25 DIAGNOSIS — Z6841 Body Mass Index (BMI) 40.0 and over, adult: Secondary | ICD-10-CM | POA: Insufficient documentation

## 2020-03-25 MED ORDER — HEPARIN SOD (PORK) LOCK FLUSH 100 UNIT/ML IV SOLN
500.0000 [IU] | Freq: Once | INTRAVENOUS | Status: AC
  Administered 2020-03-25: 500 [IU]
  Filled 2020-03-25: qty 5

## 2020-03-25 MED ORDER — SODIUM CHLORIDE 0.9% FLUSH
10.0000 mL | Freq: Once | INTRAVENOUS | Status: DC
  Filled 2020-03-25: qty 10

## 2020-03-25 MED ORDER — SODIUM CHLORIDE 0.9% FLUSH
10.0000 mL | Freq: Once | INTRAVENOUS | Status: AC
  Administered 2020-03-25: 10 mL
  Filled 2020-03-25: qty 10

## 2020-03-25 NOTE — Patient Instructions (Signed)
Your exam is normal today, I don't see any evidence of cancer. Please call me if you have another round of the symptoms related to the spot on your backside. Otherwise, I will see you in 3 months.

## 2020-04-01 NOTE — Progress Notes (Signed)
This encounter was created in error - please disregard.

## 2020-04-03 ENCOUNTER — Other Ambulatory Visit: Payer: Self-pay

## 2020-04-03 ENCOUNTER — Encounter (INDEPENDENT_AMBULATORY_CARE_PROVIDER_SITE_OTHER): Payer: Self-pay | Admitting: Adult Health

## 2020-04-03 ENCOUNTER — Ambulatory Visit (INDEPENDENT_AMBULATORY_CARE_PROVIDER_SITE_OTHER): Payer: Medicaid Other | Admitting: Adult Health

## 2020-04-03 VITALS — BP 140/87 | HR 96 | Temp 98.3°F | Ht 61.0 in | Wt 218.0 lb

## 2020-04-03 DIAGNOSIS — Z6841 Body Mass Index (BMI) 40.0 and over, adult: Secondary | ICD-10-CM

## 2020-04-03 DIAGNOSIS — E8881 Metabolic syndrome: Secondary | ICD-10-CM

## 2020-04-03 DIAGNOSIS — I10 Essential (primary) hypertension: Secondary | ICD-10-CM | POA: Diagnosis not present

## 2020-04-03 MED ORDER — VICTOZA 18 MG/3ML ~~LOC~~ SOPN
1.8000 mg | PEN_INJECTOR | Freq: Every day | SUBCUTANEOUS | 0 refills | Status: DC
Start: 2020-04-03 — End: 2020-08-27

## 2020-04-03 NOTE — Progress Notes (Signed)
Chief Complaint:   OBESITY Tamara Osborne is here to discuss her progress with her obesity treatment plan along with follow-up of her obesity related diagnoses. Tamara Osborne is on the Category 1 Plan and states she is following her eating plan approximately 85% of the time. [name] states she is doing 0 minutes 0 times per week.  Today's visit was #: 82 Starting weight: 208 lbs Starting date: 09/20/2019 Today's weight: 218 lbs Today's date: 04/03/2020 Total lbs lost to date: 0 Total lbs lost since last in-office visit: 0  Interim History: When she was acutely ill, pt chose the following foods to fuel/comfort herself: ginger ale, soups, ritz crackers. She has not been at physical therapy in over 2 weeks-prior authorization was required medicaid. She will resume weekly PT sessions soon and she is looking forward the exercise/physical activity.  Subjective:   1. Insulin resistance 01/22/2020 pt had a significantly elevated insulin level of 143.0 with elevated blood glucose of 131. She is on Victoza 1.8 mg daily.  Pt denies neck mass, dyspepsia, dysphagia, or persistent hoarseness.   Ref. Range 01/22/2020 10:02  INSULIN Latest Ref Range: 2.6 - 24.9 uIU/mL 143.0 (H)   Lab Results  Component Value Date   HGBA1C 5.2 10/30/2019    2. Essential hypertension BP slightly elevated at OV. Pt reports taking amlodipine 10 mg daily. She had dose this morning. Pt denies cardiac symptoms.  BP Readings from Last 3 Encounters:  04/03/20 140/87  03/25/20 (!) 158/98  03/07/20 (!) 144/87    Assessment/Plan:   1. Insulin resistance Tamara Osborne will continue to work on weight loss, exercise, and decreasing simple carbohydrates to help decrease the risk of diabetes. Tamara Osborne agreed to follow-up with [place] as directed to closely monitor her progress. Check labs at next OV.  - liraglutide (VICTOZA) 18 MG/3ML SOPN; Inject 1.8 mg into the skin daily.  Dispense: 9 mL; Refill: 0  2. Essential  hypertension Tamara Osborne is working on healthy weight loss and exercise to improve blood pressure control. We will watch for signs of hypotension as she continues her lifestyle modifications. Continue calcium channel blocker. Limit sodium intake.  3. Class 3 severe obesity with serious comorbidity and body mass index (BMI) of 40.0 to 44.9 in adult, unspecified obesity type (HCC) Tamara Osborne is currently in the action stage of change. As such, her goal is to continue with weight loss efforts. She has agreed to the Category 1 Plan.   Exercise goals: As is: physical therapy and daily activities  Behavioral modification strategies: increasing lean protein intake, meal planning and cooking strategies, keeping healthy foods in the home, better snacking choices and planning for success.  Tamara Osborne has agreed to follow-up with our clinic in 2-3 weeks. She was informed of the importance of frequent follow-up visits to maximize her success with intensive lifestyle modifications for her multiple health conditions.   Objective:   Blood pressure 140/87, pulse 96, temperature 98.3 F (36.8 C), height 5\' 1"  (1.549 m), weight 218 lb (98.9 kg), SpO2 95 %. Body mass index is 41.19 kg/m.  General: Cooperative, alert, well developed, in no acute distress. HEENT: Conjunctivae and lids unremarkable. Cardiovascular: Regular rhythm.  Lungs: Normal work of breathing. Neurologic: No focal deficits.   Lab Results  Component Value Date   CREATININE 1.49 (H) 01/22/2020   BUN 19 01/22/2020   NA 137 01/22/2020   K 3.9 01/22/2020   CL 106 01/22/2020   CO2 23 01/22/2020   Lab Results  Component Value Date  ALT 28 01/22/2020   AST 17 01/22/2020   ALKPHOS 96 01/22/2020   BILITOT 0.4 01/22/2020   Lab Results  Component Value Date   HGBA1C 5.2 10/30/2019   HGBA1C CANCELED 09/20/2019   Lab Results  Component Value Date   INSULIN 40.3 (H) 09/20/2019   Lab Results  Component Value Date   TSH 1.910 09/20/2019    Lab Results  Component Value Date   CHOL 191 01/22/2020   HDL 31 (L) 01/22/2020   LDLCALC 138 (H) 01/22/2020   TRIG 111 01/22/2020   CHOLHDL 6.2 01/22/2020   Lab Results  Component Value Date   WBC 6.6 08/07/2019   HGB 11.8 (L) 08/07/2019   HCT 34.8 (L) 08/07/2019   MCV 87.7 08/07/2019   PLT 267 08/07/2019   Lab Results  Component Value Date   IRON 17 (L) 03/27/2019   TIBC 283 03/27/2019   FERRITIN 32 03/27/2019   Attestation Statements:   Reviewed by clinician on day of visit: allergies, medications, problem list, medical history, surgical history, family history, social history, and previous encounter notes.  Time spent on visit including pre-visit chart review and post-visit care and charting was 35 minutes.   Coral Ceo, am acting as Location manager for Mina Marble, NP.  I have reviewed the above documentation for accuracy and completeness, and I agree with the above. -  Fani Rotondo d. Sabre Romberger, NP-C

## 2020-04-15 ENCOUNTER — Encounter (INDEPENDENT_AMBULATORY_CARE_PROVIDER_SITE_OTHER): Payer: Self-pay | Admitting: Adult Health

## 2020-04-15 NOTE — Telephone Encounter (Signed)
Please advise 

## 2020-04-16 ENCOUNTER — Encounter (INDEPENDENT_AMBULATORY_CARE_PROVIDER_SITE_OTHER): Payer: Self-pay | Admitting: Adult Health

## 2020-04-16 ENCOUNTER — Other Ambulatory Visit: Payer: Self-pay

## 2020-04-16 ENCOUNTER — Telehealth (INDEPENDENT_AMBULATORY_CARE_PROVIDER_SITE_OTHER): Payer: Medicaid Other | Admitting: Adult Health

## 2020-04-16 DIAGNOSIS — Z6841 Body Mass Index (BMI) 40.0 and over, adult: Secondary | ICD-10-CM

## 2020-04-16 DIAGNOSIS — I10 Essential (primary) hypertension: Secondary | ICD-10-CM

## 2020-04-16 DIAGNOSIS — E8881 Metabolic syndrome: Secondary | ICD-10-CM

## 2020-04-16 DIAGNOSIS — R0981 Nasal congestion: Secondary | ICD-10-CM | POA: Diagnosis not present

## 2020-04-17 NOTE — Progress Notes (Signed)
TeleHealth Visit:  Due to the COVID-19 pandemic, this visit was completed with telemedicine (audio/video) technology to reduce patient and provider exposure as well as to preserve personal protective equipment.   Tamara Osborne has verbally consented to this TeleHealth visit. The patient is located at home, the provider is located at the [facility] office. The participants in this visit include the listed provider and patient. The visit was conducted today via video.   Chief Complaint: OBESITY Tamara Osborne is here to discuss her progress with her obesity treatment plan along with follow-up of her obesity related diagnoses. Tamara Osborne is on the Category 1 Plan and states she is following her eating plan approximately 90% of the time. Tamara Osborne states she is doing 0 minutes 0 times per week.  Today's visit was #: 14 Starting weight: 208 lbs Starting date: 09/20/2019  Interim History: Pt is experiencing nasal drainage , pharyngitis, and fatigue. She has had symptoms for over 48 hours. She denies known exposure to COVID-19. She appears quite congested during telemedicine.   She tested with home antigen COVID-19 test: negative. She is on Victoza 1.8 mg daily for insulin resistance and tolerating it well.  Subjective:   1. Essential hypertension Pt is unable to check ambulatory BP. She denies acute cardiac symptoms. She is on valsartan 160 mg daily and amlodipine 10 mg daily.  BP Readings from Last 3 Encounters:  04/03/20 140/87  03/25/20 (!) 158/98  03/07/20 (!) 144/87    2. Insulin resistance Pt is on Victoza 1.8 mg daily and tolerating it well. Pt denies mass in neck, dysphagia, dyspepsia, or persistent hoarseness.  Lab Results  Component Value Date   INSULIN 40.3 (H) 09/20/2019   Lab Results  Component Value Date   HGBA1C 5.2 10/30/2019    3. Sinus congestion Pt c/o 48 hours of nasal congestion, facial pressure, fatigue, and pharyngitis. She has tested with a home  antigen test 04/15/2020 with negative results. She has no known exposure to COVID-19.  Assessment/Plan:   1. Essential hypertension Tamara Osborne is working on healthy weight loss and exercise to improve blood pressure control. We will watch for signs of hypotension as she continues her lifestyle modifications. Continue current anti-hypertensive regimen. Check labs at [place].  2. Insulin resistance Tamara Osborne will continue to work on weight loss, exercise, and decreasing simple carbohydrates to help decrease the risk of diabetes. Tamara Osborne agreed to follow-up with [place] as directed to closely monitor her progress. Continue Victoza 1.8 mg daily  3. Sinus congestion Increase rest and fluids. Remain home with acute symptoms. Re-test with antigen test in 1-2 days.  4. Class 3 severe obesity with serious comorbidity and body mass index (BMI) of 40.0 to 44.9 in adult, unspecified obesity type (HCC) Tamara Osborne is currently in the action stage of change. As such, her goal is to continue with weight loss efforts. She has agreed to the Category 1 Plan.   Exercise goals: No exercise has been prescribed at this time.  Behavioral modification strategies: increasing lean protein intake, meal planning and cooking strategies, better snacking choices and planning for success.  Tamara Osborne has agreed to follow-up with our clinic in 2 weeks. She was informed of the importance of frequent follow-up visits to maximize her success with intensive lifestyle modifications for her multiple health conditions.  Objective:   VITALS: Per patient if applicable, see vitals. GENERAL: Alert and in no acute distress. CARDIOPULMONARY: No increased WOB. Speaking in clear sentences.  PSYCH: Pleasant and cooperative. Speech normal rate and  rhythm. Affect is appropriate. Insight and judgement are appropriate. Attention is focused, linear, and appropriate.  NEURO: Oriented as arrived to appointment on time with no prompting.   Lab Results   Component Value Date   CREATININE 1.49 (H) 01/22/2020   BUN 19 01/22/2020   NA 137 01/22/2020   K 3.9 01/22/2020   CL 106 01/22/2020   CO2 23 01/22/2020   Lab Results  Component Value Date   ALT 28 01/22/2020   AST 17 01/22/2020   ALKPHOS 96 01/22/2020   BILITOT 0.4 01/22/2020   Lab Results  Component Value Date   HGBA1C 5.2 10/30/2019   HGBA1C CANCELED 09/20/2019   Lab Results  Component Value Date   INSULIN 40.3 (H) 09/20/2019   Lab Results  Component Value Date   TSH 1.910 09/20/2019   Lab Results  Component Value Date   CHOL 191 01/22/2020   HDL 31 (L) 01/22/2020   LDLCALC 138 (H) 01/22/2020   TRIG 111 01/22/2020   CHOLHDL 6.2 01/22/2020   Lab Results  Component Value Date   WBC 6.6 08/07/2019   HGB 11.8 (L) 08/07/2019   HCT 34.8 (L) 08/07/2019   MCV 87.7 08/07/2019   PLT 267 08/07/2019   Lab Results  Component Value Date   IRON 17 (L) 03/27/2019   TIBC 283 03/27/2019   FERRITIN 32 03/27/2019    Attestation Statements:   Reviewed by clinician on day of visit: allergies, medications, problem list, medical history, surgical history, family history, social history, and previous encounter notes.  Time spent on visit including pre-visit chart review and post-visit charting and care was 32 minutes.   Coral Ceo, am acting as Location manager for Mina Marble, NP.  I have reviewed the above documentation for accuracy and completeness, and I agree with the above. - Annissa Andreoni d. Zakeria Kulzer, NP-C

## 2020-04-23 ENCOUNTER — Telehealth: Payer: Self-pay | Admitting: Neurology

## 2020-04-23 NOTE — Telephone Encounter (Signed)
Pt advised Dr. Rexene Alberts has agreed to pt starting on the [place]. Pt was advised on the day of her appt to bring her machine and power cord so we could access her compliance data. Pt was agreeable to doing this. Pt will call [employer] back and schedule set update. I we have moved march initial visit for pap out to April to be within the 31-90 day window.

## 2020-04-23 NOTE — Telephone Encounter (Signed)
Pt has been contacted by DME and told there is only a [place] 2 available with no [place], pt asking if it would be ok for her to accept that

## 2020-04-25 ENCOUNTER — Other Ambulatory Visit: Payer: Self-pay

## 2020-04-25 ENCOUNTER — Ambulatory Visit: Payer: Medicaid Other | Attending: Radiation Oncology | Admitting: Physical Therapy

## 2020-04-25 ENCOUNTER — Encounter: Payer: Self-pay | Admitting: Physical Therapy

## 2020-04-25 DIAGNOSIS — N39498 Other specified urinary incontinence: Secondary | ICD-10-CM

## 2020-04-25 DIAGNOSIS — R2689 Other abnormalities of gait and mobility: Secondary | ICD-10-CM

## 2020-04-25 DIAGNOSIS — M6281 Muscle weakness (generalized): Secondary | ICD-10-CM | POA: Diagnosis present

## 2020-04-25 DIAGNOSIS — R252 Cramp and spasm: Secondary | ICD-10-CM | POA: Diagnosis present

## 2020-04-25 NOTE — Patient Instructions (Signed)
Access Code: [email or web address] URL: [email or web address] Date: 04/25/2020 Prepared by: Earlie Counts  Exercises Seated Piriformis Stretch with Trunk Bend - 1 x daily - 7 x weekly - 1 sets - 2 reps - 30 sec hold Seated Hamstring Stretch - 1 x daily - 7 x weekly - 1 sets - 2 reps - 30 sec hold Seated Hip Adductor Stretch - 1 x daily - 7 x weekly - 1 sets - 2 reps - 30 sec hold Sit to Stand - 1 x daily - 7 x weekly - 1 sets - 10 reps Standing Single Leg Stance with Counter Support - 1 x daily - 7 x weekly - 1 sets - 3 reps - 15 sec hold Tandem Stance with Support - 1 x daily - 7 x weekly - 1 sets - 2 reps - 30 sec hold Standing Anti-Rotation Press with Anchored Resistance - 1 x daily - 7 x weekly - 2 sets - 10 reps Standing Diagonal Chop - 1 x daily - 7 x weekly - 2 sets - 10 reps Standing Chest Press with Resistance Band with PLB - 1 x daily - 7 x weekly - 2 sets - 10 reps Deadlift with Resistance - 1 x daily - 7 x weekly - 2 sets - 10 reps Side Stepping with Resistance at Thighs - 1 x daily - 7 x weekly - 2 sets - 10 reps Bridge with Hip Abduction and Resistance - Ground Touches - 1 x daily - 7 x weekly - 2 sets - 10 reps Dead Bug - 1 x daily - 7 x weekly - 2 sets - 10 reps Beginner Front Arm Support - 1 x daily - 7 x weekly - 2 sets - 10 reps [facility] [address] [place], [place] [address] Phone # [phone] Fax [phone]

## 2020-04-25 NOTE — Therapy (Signed)
[facility] [address] [place], [place], [address] Phone: [phone]   Fax:  [phone]  Physical Therapy Treatment  Patient Details  Name: Tamara Osborne MRN: [id number] Date of Birth: 11/05/1975 Referring Provider (PT): Dr. Gery Pray   Encounter Date: 04/25/2020   PT End of Session - 04/25/20 1144    Visit Number 15    Date for PT Re-Evaluation 06/20/20    Authorization Type [employer]    Authorization Time Period 1/24-4/4    Authorization - Visit Number 1    Authorization - Number of Visits 6    PT Start Time 1100    PT Stop Time 1140    PT Time Calculation (min) 40 min    Activity Tolerance Patient tolerated treatment well;No increased pain    Behavior During Therapy WFL for tasks assessed/performed           Past Medical History:  Diagnosis Date  . Anemia   . Anxiety   . Bartholin cyst   . Cervical cancer ([place])   . Constipation   . Depression   . Deviated septum   . Diabetes mellitus without complication ([place])   . Difficult intravenous access    used port for 05-09-2019 surgery  . Fibroids   . History of blood transfusion    2 units given 04-26-2019, has had total of 8 units since jan 2021  . History of blood transfusion 1 unit given 05-30-19   iv fluids also given  . History of kidney problems   . History of recent blood transfusion 05/16/2019   2 units given  per dr Pollyann Savoy at cancer center  . Hypertension   . Neuropathy    both thumbs  . Obesity   . Palpitations   . PONV (postoperative nausea and vomiting)   . Prediabetes   . Preeclampsia 2006  . Sleep apnea    no cpap used insurance would not cover, osa severe per pt  . SOB (shortness of breath)   . Swallowing difficulty     Past Surgical History:  Procedure Laterality Date  . CESAREAN SECTION WITH BILATERAL TUBAL LIGATION  11/10/2004      . DILATION AND CURETTAGE OF UTERUS  1997  . IR IMAGING GUIDED PORT INSERTION   04/04/2019  . OPERATIVE ULTRASOUND N/A 05/09/2019   Procedure: OPERATIVE ULTRASOUND;  Surgeon: Gery Pray, MD;  Location: [facility];  Service: Urology;  Laterality: N/A;  . OPERATIVE ULTRASOUND N/A 05/15/2019   Procedure: OPERATIVE ULTRASOUND;  Surgeon: Gery Pray, MD;  Location: [facility];  Service: Urology;  Laterality: N/A;  . OPERATIVE ULTRASOUND N/A 05/22/2019   Procedure: OPERATIVE ULTRASOUND;  Surgeon: Gery Pray, MD;  Location: [facility];  Service: Urology;  Laterality: N/A;  . OPERATIVE ULTRASOUND N/A 05/29/2019   Procedure: OPERATIVE ULTRASOUND;  Surgeon: Gery Pray, MD;  Location: [facility];  Service: Urology;  Laterality: N/A;  . OPERATIVE ULTRASOUND N/A 06/05/2019   Procedure: OPERATIVE ULTRASOUND;  Surgeon: Gery Pray, MD;  Location: [facility];  Service: Urology;  Laterality: N/A;  . TANDEM RING INSERTION N/A 05/09/2019   Procedure: TANDEM RING INSERTION;  Surgeon: Gery Pray, MD;  Location: [facility];  Service: Urology;  Laterality: N/A;  . TANDEM RING INSERTION N/A 05/15/2019   Procedure: TANDEM RING INSERTION;  Surgeon: Gery Pray, MD;  Location: [facility];  Service: Urology;  Laterality: N/A;  . TANDEM RING  INSERTION N/A 05/22/2019   Procedure: TANDEM RING INSERTION;  Surgeon: Gery Pray, MD;  Location: [facility];  Service: Urology;  Laterality: N/A;  . TANDEM RING INSERTION N/A 05/29/2019   Procedure: TANDEM RING INSERTION;  Surgeon: Gery Pray, MD;  Location: [facility];  Service: Urology;  Laterality: N/A;  . TANDEM RING INSERTION N/A 06/05/2019   Procedure: TANDEM RING INSERTION;  Surgeon: Gery Pray, MD;  Location: [facility];  Service: Urology;  Laterality: N/A;    There were no vitals filed for this visit.   Subjective Assessment - 04/25/20 1103    Subjective I still have trouble with my  feet. When it hits me I have to go. I am getting my sleep machine on 3/2. Patient is on the third size dilator and has not went to the last. Patient is nervourse to use the largest. MD is able to put the speculum in with ease and see the vaginal canal.    Pertinent History Cervical cancer; radiation 03/23/2019-06/05/2019    Patient Stated Goals improve urinary incontinence, help with use of dilators    Currently in Pain? No/denies              [facility] PT Assessment - 04/25/20 0001      Assessment   Medical Diagnosis C53.9 Squamous cell carcinoma of cervix    Referring Provider (PT) Dr. Gery Pray    Onset Date/Surgical Date 03/18/19    Prior Therapy none      Precautions   Precautions Other (comment)    Precaution Comments cervical cancer with radiation      Restrictions   Weight Bearing Restrictions No      [place] residence      Prior Function   Level of Independence Independent      Cognition   Overall Cognitive Status Within Functional Limits for tasks assessed      Posture/Postural Control   Posture/Postural Control Postural limitations    Posture Comments protruding abdomen, short waisted      ROM / Strength   AROM / PROM / Strength AROM;PROM;Strength      PROM   Right Hip Internal Rotation  15    Left Hip Internal Rotation  15      Strength   Overall Strength Comments PF strength is 3/5 bil.    Right Hip Flexion 5/5    Right Hip Extension 4/5    Right Hip External Rotation  5/5    Right Hip ABduction 4+/5    Left Hip Flexion 5/5    Left Hip Extension 4/5    Left Hip External Rotation 5/5    Left Hip ABduction 4+/5                      Pelvic Floor Special Questions - 04/25/20 0001    Urinary Leakage Yes    Pad use no pad    Other activities that cause leaking when she has the urge and has to walk a distance    Strength fair squeeze, definite lift             [facility] Adult PT Treatment/Exercise -  04/25/20 0001      Lumbar Exercises: Standing   Other Standing Lumbar Exercises dead bug with 5# and 10# with tactile cues with scapular retraction and keeping the hips flexed when going forward      Lumbar Exercises: Supine   Bent Knee  Raise 20 reps;1 second    Bent Knee Raise Limitations while pulling red band downward; blue loop around thighs    Bridge with clamshell 20 reps;1 second    Bridge with [employer] Limitations blue loop band      Lumbar Exercises: Quadruped   Straight Leg Raise 10 reps;1 second    Straight Leg Raises Limitations each side keeping abdominals contracted                  PT Education - 04/25/20 1143    Education Details Access Code: [email or web address]    Person(s) Educated Patient    Methods Explanation;Demonstration;Verbal cues;Handout    Comprehension Returned demonstration;Verbalized understanding            PT Short Term Goals - 01/11/20 0948      PT SHORT TERM GOAL #1   Title independent with initial HEP    Time 4    Period Weeks    Status Achieved      PT SHORT TERM GOAL #2   Title educated on vaginal moisturizers to improve tissue integrity    Baseline educated    Time 4    Period Weeks    Status Achieved      PT SHORT TERM GOAL #3   Title educated on ways to use dilators to not increase abdominal pressure so they will stay in the vaginal canal    Baseline educated    Time 4    Period Weeks    Status Achieved             PT Long Term Goals - 04/25/20 1145      PT LONG TERM GOAL #1   Title independent with advanced exercises for strength, flexibility, and endurance    Baseline advance as she changes    Time 10    Period Weeks    Status On-going      PT LONG TERM GOAL #2   Title able to use the largest dilator with no pain so she is able to having penile penetration with minimal to no difficulty    Baseline on the second to last dilator    Time 10    Period Weeks    Status On-going      PT LONG TERM GOAL #3    Title urinary leakage decreased >/= 75% and down to 0-1 thin pad due to increased pelvic floor strength and increased tissue mobility    Baseline will leak when she has the urge and has a distance to walk    Time 10    Period Weeks    Status On-going      PT LONG TERM GOAL #4   Title able to walk for 20 minutes in the store and for exercise without having to go to the bathroom due to increased endrance and pelvic floor strength    Time 10    Period Weeks    Status Achieved      PT LONG TERM GOAL #5   Title sit to stand </= 12 seconds due to improved strength and balance to reduce her risk of falls    Baseline 11.93    Time 10    Period Weeks    Status Achieved                 Plan - 04/25/20 1146    Clinical Impression Statement Patient has increased strength of bilateral hips with weakness in hip abduction and extension. Patient pelvic floor  strength is 3/5 and will leak urine when she has the urge and has a distance to walk. Patient has weak abdominals. Patient is working on walking more consistently. Patient is on the second to last dilator with soreness and nervous to go to the next size. Patient was able to have a vaginal exam with greater ease and the doctor was able to look into the vaginal canal. Patient will benefit from skilled therapy to increased pelvic floor outlet for penile penetration and increase her core strength.    Personal Factors and Comorbidities Age;Fitness;Comorbidity 3+;Sex;Time since onset of injury/illness/exacerbation    Comorbidities Cervical cancer 03/18/2019 with radiation 03/23/2019-06/05/2019; Diabetes; C-section 11/11/2014; nueropathy in toes    Examination-Activity Limitations Locomotion Level;Bed Mobility;Squat;Continence;Stairs;Stand;Lift;Toileting    Examination-Participation Restrictions Meal Prep;Cleaning;Community Activity;Interpersonal Relationship;Laundry    Stability/Clinical Decision Making Evolving/Moderate complexity    Rehab Potential  Good    PT Frequency 1x / week    PT Duration 8 weeks    PT Treatment/Interventions ADLs/Self Care Home Management;Biofeedback;Functional mobility training;Therapeutic activities;Therapeutic exercise;Balance training;Neuromuscular re-education;Manual techniques;Patient/family education;Dry needling;Energy conservation;Passive range of motion;Spinal Manipulations;Joint Manipulations    PT Next Visit Plan internal work, core strength with diagonals; balance; see how it is going with her vaginal dilator and desert reveleum    PT Home Exercise Plan Access Code: [email or web address]    Consulted and Agree with Plan of Care Patient           Patient will benefit from skilled therapeutic intervention in order to improve the following deficits and impairments:  Decreased coordination,Decreased range of motion,Difficulty walking,Increased fascial restricitons,Decreased endurance,Increased muscle spasms,Pain,Decreased activity tolerance,Decreased balance,Impaired flexibility,Decreased strength,Decreased mobility  Visit Diagnosis: Muscle weakness (generalized) - Plan: PT plan of care cert/re-cert  Cramp and spasm - Plan: PT plan of care cert/re-cert  Other abnormalities of gait and mobility - Plan: PT plan of care cert/re-cert  Other urinary incontinence - Plan: PT plan of care cert/re-cert     Problem List Patient Active Problem List   Diagnosis Date Noted  . Sinus congestion 04/16/2020  . Port-A-Cath in place 01/22/2020  . Mixed hyperlipidemia 01/10/2020  . Vitamin D deficiency 01/10/2020  . Insulin resistance 01/10/2020  . Class 3 severe obesity with serious comorbidity and body mass index (BMI) of 40.0 to 44.9 in adult [facility]) 01/10/2020  . Obesity, Class II, BMI 35-39.9 09/07/2019  . Dehydration 05/30/2019  . Peripheral neuropathy due to chemotherapy ([place]) 04/25/2019  . Diarrhea 04/04/2019  . CKD (chronic kidney disease), symptom management only, stage 3 (moderate) ([place]) 03/30/2019  . Generalized  anxiety disorder 03/30/2019  . Essential hypertension   . Anxiety 03/23/2019  . Squamous cell carcinoma of cervix ([place]) 03/23/2019  . Deficiency anemia 03/22/2019  . Squamous cell carcinoma in situ (SCCIS) of cervix 03/22/2019    Earlie Counts, PT 04/25/20 11:52 AM   [facility] [address] [place], [place], [address] Phone: [phone]   Fax:  [phone]  Name: BLAKELEY SCHEIER MRN: [id number] Date of Birth: 10-Mar-1975

## 2020-04-30 ENCOUNTER — Ambulatory Visit (INDEPENDENT_AMBULATORY_CARE_PROVIDER_SITE_OTHER): Payer: Self-pay | Admitting: Adult Health

## 2020-05-02 ENCOUNTER — Ambulatory Visit (INDEPENDENT_AMBULATORY_CARE_PROVIDER_SITE_OTHER): Payer: Medicaid Other | Admitting: Family Medicine

## 2020-05-02 ENCOUNTER — Other Ambulatory Visit: Payer: Self-pay

## 2020-05-02 ENCOUNTER — Ambulatory Visit: Payer: Medicaid Other | Attending: Radiation Oncology | Admitting: Physical Therapy

## 2020-05-02 ENCOUNTER — Encounter (INDEPENDENT_AMBULATORY_CARE_PROVIDER_SITE_OTHER): Payer: Self-pay | Admitting: Family Medicine

## 2020-05-02 ENCOUNTER — Encounter: Payer: Self-pay | Admitting: Physical Therapy

## 2020-05-02 VITALS — BP 114/79 | HR 79 | Temp 97.4°F | Ht 61.0 in | Wt 223.0 lb

## 2020-05-02 DIAGNOSIS — R252 Cramp and spasm: Secondary | ICD-10-CM | POA: Insufficient documentation

## 2020-05-02 DIAGNOSIS — N39498 Other specified urinary incontinence: Secondary | ICD-10-CM | POA: Diagnosis present

## 2020-05-02 DIAGNOSIS — E8881 Metabolic syndrome: Secondary | ICD-10-CM | POA: Diagnosis not present

## 2020-05-02 DIAGNOSIS — E559 Vitamin D deficiency, unspecified: Secondary | ICD-10-CM | POA: Diagnosis not present

## 2020-05-02 DIAGNOSIS — Z6841 Body Mass Index (BMI) 40.0 and over, adult: Secondary | ICD-10-CM

## 2020-05-02 DIAGNOSIS — R2689 Other abnormalities of gait and mobility: Secondary | ICD-10-CM | POA: Insufficient documentation

## 2020-05-02 DIAGNOSIS — E88819 Insulin resistance, unspecified: Secondary | ICD-10-CM

## 2020-05-02 DIAGNOSIS — E782 Mixed hyperlipidemia: Secondary | ICD-10-CM

## 2020-05-02 DIAGNOSIS — M6281 Muscle weakness (generalized): Secondary | ICD-10-CM | POA: Insufficient documentation

## 2020-05-02 NOTE — Therapy (Signed)
[facility] [address] [place], [place], [address] Phone: [phone]   Fax:  [phone]  Physical Therapy Treatment  Patient Details  Name: Tamara Osborne MRN: [id number] Date of Birth: 16-Jun-1975 Referring Provider (PT): Dr. Gery Pray   Encounter Date: 05/02/2020   PT End of Session - 05/02/20 1109    Visit Number 16    Date for PT Re-Evaluation 06/20/20    Authorization Type [employer]    Authorization Time Period 1/24-4/4    Authorization - Visit Number 2    Authorization - Number of Visits 6    PT Start Time 1100    PT Stop Time 1140    PT Time Calculation (min) 40 min    Activity Tolerance Patient tolerated treatment well;No increased pain    Behavior During Therapy WFL for tasks assessed/performed           Past Medical History:  Diagnosis Date  . Anemia   . Anxiety   . Bartholin cyst   . Cervical cancer ([place])   . Constipation   . Depression   . Deviated septum   . Diabetes mellitus without complication ([place])   . Difficult intravenous access    used port for 05-09-2019 surgery  . Fibroids   . History of blood transfusion    2 units given 04-26-2019, has had total of 8 units since jan 2021  . History of blood transfusion 1 unit given 05-30-19   iv fluids also given  . History of kidney problems   . History of recent blood transfusion 05/16/2019   2 units given  per dr Pollyann Savoy at cancer center  . Hypertension   . Neuropathy    both thumbs  . Obesity   . Palpitations   . PONV (postoperative nausea and vomiting)   . Prediabetes   . Preeclampsia 2006  . Sleep apnea    no cpap used insurance would not cover, osa severe per pt  . SOB (shortness of breath)   . Swallowing difficulty     Past Surgical History:  Procedure Laterality Date  . CESAREAN SECTION WITH BILATERAL TUBAL LIGATION  11/10/2004      . DILATION AND CURETTAGE OF UTERUS  1997  . IR IMAGING GUIDED PORT INSERTION   04/04/2019  . OPERATIVE ULTRASOUND N/A 05/09/2019   Procedure: OPERATIVE ULTRASOUND;  Surgeon: Gery Pray, MD;  Location: [facility];  Service: Urology;  Laterality: N/A;  . OPERATIVE ULTRASOUND N/A 05/15/2019   Procedure: OPERATIVE ULTRASOUND;  Surgeon: Gery Pray, MD;  Location: [facility];  Service: Urology;  Laterality: N/A;  . OPERATIVE ULTRASOUND N/A 05/22/2019   Procedure: OPERATIVE ULTRASOUND;  Surgeon: Gery Pray, MD;  Location: [facility];  Service: Urology;  Laterality: N/A;  . OPERATIVE ULTRASOUND N/A 05/29/2019   Procedure: OPERATIVE ULTRASOUND;  Surgeon: Gery Pray, MD;  Location: [facility];  Service: Urology;  Laterality: N/A;  . OPERATIVE ULTRASOUND N/A 06/05/2019   Procedure: OPERATIVE ULTRASOUND;  Surgeon: Gery Pray, MD;  Location: [facility];  Service: Urology;  Laterality: N/A;  . TANDEM RING INSERTION N/A 05/09/2019   Procedure: TANDEM RING INSERTION;  Surgeon: Gery Pray, MD;  Location: [facility];  Service: Urology;  Laterality: N/A;  . TANDEM RING INSERTION N/A 05/15/2019   Procedure: TANDEM RING INSERTION;  Surgeon: Gery Pray, MD;  Location: [facility];  Service: Urology;  Laterality: N/A;  . TANDEM RING  INSERTION N/A 05/22/2019   Procedure: TANDEM RING INSERTION;  Surgeon: Gery Pray, MD;  Location: [facility];  Service: Urology;  Laterality: N/A;  . TANDEM RING INSERTION N/A 05/29/2019   Procedure: TANDEM RING INSERTION;  Surgeon: Gery Pray, MD;  Location: [facility];  Service: Urology;  Laterality: N/A;  . TANDEM RING INSERTION N/A 06/05/2019   Procedure: TANDEM RING INSERTION;  Surgeon: Gery Pray, MD;  Location: [facility];  Service: Urology;  Laterality: N/A;    There were no vitals filed for this visit.   Subjective Assessment - 05/02/20 1111    Subjective I have not used the bigger  dilator. I have a CPAP machine now. i have been focusing on my CPAP machine. I feel I am ready for the next dilator.    Pertinent History Cervical cancer; radiation 03/23/2019-06/05/2019    Patient Stated Goals improve urinary incontinence, help with use of dilators    Currently in Pain? No/denies                          Pelvic Floor Special Questions - 05/02/20 0001    Pelvic Floor Internal Exam Patient confirmed identification and approves PT to assess pelvic floor and treatment    Exam Type Vaginal    Strength fair squeeze, definite lift   improved circular contraction            [facility] Adult PT Treatment/Exercise - 05/02/20 0001      Lumbar Exercises: Aerobic   Stationary Bike level 3 for 8 minutes  while assessing patient      Manual Therapy   Manual Therapy Internal Pelvic Floor    Internal Pelvic Floor manual work to the puborectalis, obturator internist, perineal body, iliococygeus, urethra sphinter with good lengthening of the muscles                    PT Short Term Goals - 01/11/20 0948      PT SHORT TERM GOAL #1   Title independent with initial HEP    Time 4    Period Weeks    Status Achieved      PT SHORT TERM GOAL #2   Title educated on vaginal moisturizers to improve tissue integrity    Baseline educated    Time 4    Period Weeks    Status Achieved      PT SHORT TERM GOAL #3   Title educated on ways to use dilators to not increase abdominal pressure so they will stay in the vaginal canal    Baseline educated    Time 4    Period Weeks    Status Achieved             PT Long Term Goals - 05/02/20 1113      PT LONG TERM GOAL #1   Title independent with advanced exercises for strength, flexibility, and endurance    Baseline advance as she changes    Time 10    Period Weeks    Status On-going      PT LONG TERM GOAL #2   Title able to use the largest dilator with no pain so she is able to having penile penetration with  minimal to no difficulty    Baseline on the second to last dilator    Time 10    Period Weeks    Status On-going      PT LONG TERM  GOAL #3   Title urinary leakage decreased >/= 75% and down to 0-1 thin pad due to increased pelvic floor strength and increased tissue mobility    Baseline will leak when she has the urge and has a distance to walk    Time 10    Period Weeks      PT LONG TERM GOAL #4   Title able to walk for 20 minutes in the store and for exercise without having to go to the bathroom due to increased endrance and pelvic floor strength    Period Weeks    Status Achieved      PT LONG TERM GOAL #5   Baseline 11.93    Time 10    Period Weeks    Status Achieved                 Plan - 05/02/20 1109    Clinical Impression Statement Patient is going to try the largest dilator today after the manual work the therapist has done. Patient pelvic floor strength is 3/5 with a better circular contraction. Patient reports she is able to feel the pelvic floor contraction better. Patient wears a light pad when she goes out just in case. Patient has not had intercourse to see if it is painful. Patient vaginal tissue looks healthy. Patient will benefit from skilled therapy to increase pelvic floor outlet for penile penetration and increase her core.    Personal Factors and Comorbidities Age;Fitness;Comorbidity 3+;Sex;Time since onset of injury/illness/exacerbation    Comorbidities Cervical cancer 03/18/2019 with radiation 03/23/2019-06/05/2019; Diabetes; C-section 11/11/2014; nueropathy in toes    Examination-Activity Limitations Locomotion Level;Bed Mobility;Squat;Continence;Stairs;Stand;Lift;Toileting    Examination-Participation Restrictions Meal Prep;Cleaning;Community Activity;Interpersonal Relationship;Laundry    Stability/Clinical Decision Making Evolving/Moderate complexity    Rehab Potential Good    PT Frequency 1x / week    PT Duration 8 weeks    PT Treatment/Interventions  ADLs/Self Care Home Management;Biofeedback;Functional mobility training;Therapeutic activities;Therapeutic exercise;Balance training;Neuromuscular re-education;Manual techniques;Patient/family education;Dry needling;Energy conservation;Passive range of motion;Spinal Manipulations;Joint Manipulations    PT Next Visit Plan internal work, core strength with diagonals; balance; see how it is going with her vaginal dilator and desert reveleum    PT Home Exercise Plan Access Code: [email or web address]    Consulted and Agree with Plan of Care Patient           Patient will benefit from skilled therapeutic intervention in order to improve the following deficits and impairments:  Decreased coordination,Decreased range of motion,Difficulty walking,Increased fascial restricitons,Decreased endurance,Increased muscle spasms,Pain,Decreased activity tolerance,Decreased balance,Impaired flexibility,Decreased strength,Decreased mobility  Visit Diagnosis: Muscle weakness (generalized)  Cramp and spasm  Other abnormalities of gait and mobility  Other urinary incontinence     Problem List Patient Active Problem List   Diagnosis Date Noted  . Sinus congestion 04/16/2020  . Port-A-Cath in place 01/22/2020  . Mixed hyperlipidemia 01/10/2020  . Vitamin D deficiency 01/10/2020  . Insulin resistance 01/10/2020  . Class 3 severe obesity with serious comorbidity and body mass index (BMI) of 40.0 to 44.9 in adult [facility]) 01/10/2020  . Obesity, Class II, BMI 35-39.9 09/07/2019  . Dehydration 05/30/2019  . Peripheral neuropathy due to chemotherapy ([place]) 04/25/2019  . Diarrhea 04/04/2019  . CKD (chronic kidney disease), symptom management only, stage 3 (moderate) ([place]) 03/30/2019  . Generalized anxiety disorder 03/30/2019  . Essential hypertension   . Anxiety 03/23/2019  . Squamous cell carcinoma of cervix ([place]) 03/23/2019  . Deficiency anemia 03/22/2019  . Squamous cell carcinoma in situ (SCCIS) of cervix 03/22/2019  Earlie Counts, PT 05/02/20 11:46 AM   [facility] [address], [place], [place], [address] Phone: [phone]   Fax:  [phone]  Name: JADEE GOLEBIEWSKI MRN: [id number] Date of Birth: 1975-07-23

## 2020-05-06 ENCOUNTER — Telehealth: Payer: Self-pay | Admitting: Oncology

## 2020-05-06 ENCOUNTER — Other Ambulatory Visit: Payer: Self-pay

## 2020-05-06 DIAGNOSIS — E8881 Metabolic syndrome: Secondary | ICD-10-CM

## 2020-05-06 DIAGNOSIS — E88819 Insulin resistance, unspecified: Secondary | ICD-10-CM

## 2020-05-06 DIAGNOSIS — E559 Vitamin D deficiency, unspecified: Secondary | ICD-10-CM

## 2020-05-06 NOTE — Telephone Encounter (Signed)
Tamara Osborne called and said she is not feeling well today and needs to cancel her flush appointment.  She already has another scheduled for 05/20/20 and is wondering if she can have labs drawn from the [facility].  She is going to forward a written script via email with the labs needed.

## 2020-05-07 NOTE — Progress Notes (Signed)
Chief Complaint:   OBESITY Tamara Osborne is here to discuss her progress with her obesity treatment plan along with follow-up of her obesity related diagnoses. Tamara Osborne is on the Category 1 Plan and states she is following her eating plan approximately 90% of the time. Tamara Osborne states she is doing PT and walking 30 minutes 1 times per week.  Today's visit was #: 15 Starting weight: 208 lbs Starting date: 09/20/2019 Today's weight: 223 lbs Today's date: 05/02/2020 Total lbs lost to date: 0 Total lbs lost since last in-office visit: 0  Interim History: Tamara Osborne finally got her CPAP earlier this week. She is feeling much better in the morning after sleeping. She does find that she gets to sleep on her back now. She has been doing physical therapy. She is up 5 lbs with an increase in both fat and water mass. She has been doing a few extra snacks, especially on PT days.  Subjective:   1. Insulin resistance Tamara Osborne's last A1c was 5.2 with an insulin level of 143. She is on Victoza. She is getting a port flush in 4 days.   2. Vitamin D deficiency Pt denies nausea, vomiting, and muscle weakness but notes fatigue. Pt is on OTC Vit D.  3. Mixed hyperlipidemia Tamara Osborne's last LDL was 138, HDL 31, and triglycerides 111. She is not on statin therapy.  Assessment/Plan:   1. Insulin resistance Tamara Osborne will continue to work on weight loss, exercise, and decreasing simple carbohydrates to help decrease the risk of diabetes. Tamara Osborne agreed to follow-up with [place] as directed to closely monitor her progress. Check labs today.  2. Vitamin D deficiency Low Vitamin D level contributes to fatigue and are associated with obesity, breast, and colon cancer. She agrees to continue to take OTC Vitamin D and will follow-up for routine testing of Vitamin D, at least 2-3 times per year to avoid over-replacement. Check labs today.  3. Mixed hyperlipidemia Cardiovascular risk and specific lipid/LDL goals reviewed.   We discussed several lifestyle modifications today and Tamara Osborne will continue to work on diet, exercise and weight loss efforts. Orders and follow up as documented in patient record. Check labs today.  Counseling Intensive lifestyle modifications are the first line treatment for this issue. . Dietary changes: Increase soluble fiber. Decrease simple carbohydrates. . Exercise changes: Moderate to vigorous-intensity aerobic activity 150 minutes per week if tolerated. . Lipid-lowering medications: see documented in medical record.  4. Class 3 severe obesity with serious comorbidity and body mass index (BMI) of 40.0 to 44.9 in adult, unspecified obesity type (HCC) Tamara Osborne is currently in the action stage of change. As such, her goal is to continue with weight loss efforts. She has agreed to the Category 2 Plan.   Exercise goals: As is- physical therapy  Behavioral modification strategies: increasing lean protein intake, decreasing simple carbohydrates, meal planning and cooking strategies and planning for success.  Tamara Osborne has agreed to follow-up with our clinic in 2 weeks with Tamara Osborne. She was informed of the importance of frequent follow-up visits to maximize her success with intensive lifestyle modifications for her multiple health conditions.   Tamara Osborne was informed we would discuss her lab results at her next visit unless there is a critical issue that needs to be addressed sooner. Tamara Osborne agreed to keep her next visit at the agreed upon time to discuss these results.  Objective:   Blood pressure 114/79, pulse 79, temperature (!) 97.4 F (36.3 C), temperature source Oral, height 5\' 1"  (1.549 m), weight  223 lb (101.2 kg), SpO2 97 %. Body mass index is 42.14 kg/m.  General: Cooperative, alert, well developed, in no acute distress. HEENT: Conjunctivae and lids unremarkable. Cardiovascular: Regular rhythm.  Lungs: Normal work of breathing. Neurologic: No focal deficits.   Lab Results   Component Value Date   CREATININE 1.49 (H) 01/22/2020   BUN 19 01/22/2020   NA 137 01/22/2020   K 3.9 01/22/2020   CL 106 01/22/2020   CO2 23 01/22/2020   Lab Results  Component Value Date   ALT 28 01/22/2020   AST 17 01/22/2020   ALKPHOS 96 01/22/2020   BILITOT 0.4 01/22/2020   Lab Results  Component Value Date   HGBA1C 5.2 10/30/2019   HGBA1C CANCELED 09/20/2019   Lab Results  Component Value Date   INSULIN 40.3 (H) 09/20/2019   Lab Results  Component Value Date   TSH 1.910 09/20/2019   Lab Results  Component Value Date   CHOL 191 01/22/2020   HDL 31 (L) 01/22/2020   LDLCALC 138 (H) 01/22/2020   TRIG 111 01/22/2020   CHOLHDL 6.2 01/22/2020   Lab Results  Component Value Date   WBC 6.6 08/07/2019   HGB 11.8 (L) 08/07/2019   HCT 34.8 (L) 08/07/2019   MCV 87.7 08/07/2019   PLT 267 08/07/2019   Lab Results  Component Value Date   IRON 17 (L) 03/27/2019   TIBC 283 03/27/2019   FERRITIN 32 03/27/2019    Attestation Statements:   Reviewed by clinician on day of visit: allergies, medications, problem list, medical history, surgical history, family history, social history, and previous encounter notes.  Coral Ceo, am acting as transcriptionist for Coralie Common, MD.   I have reviewed the above documentation for accuracy and completeness, and I agree with the above. - Jinny Blossom, MD

## 2020-05-09 ENCOUNTER — Other Ambulatory Visit: Payer: Self-pay

## 2020-05-09 ENCOUNTER — Encounter: Payer: Self-pay | Admitting: Physical Therapy

## 2020-05-09 ENCOUNTER — Ambulatory Visit: Payer: Medicaid Other | Admitting: Physical Therapy

## 2020-05-09 DIAGNOSIS — R252 Cramp and spasm: Secondary | ICD-10-CM

## 2020-05-09 DIAGNOSIS — M6281 Muscle weakness (generalized): Secondary | ICD-10-CM

## 2020-05-09 DIAGNOSIS — N39498 Other specified urinary incontinence: Secondary | ICD-10-CM

## 2020-05-09 NOTE — Therapy (Signed)
[facility] [address] [place], [place], [address] Phone: [phone]   Fax:  [phone]  Physical Therapy Treatment  Patient Details  Name: Tamara Osborne MRN: [id number] Date of Birth: Feb 05, 1976 Referring Provider (Tamara Osborne): Dr. Gery Pray   Encounter Date: 05/09/2020   Tamara Osborne End of Session - 05/09/20 1142    Visit Number 54    Authorization Time Period 1/24-4/4    Authorization - Visit Number 3    Authorization - Number of Visits 6    Tamara Osborne Start Time 1100    Tamara Osborne Stop Time 3810    Tamara Osborne Time Calculation (min) 38 min    Activity Tolerance Patient tolerated treatment well;No increased pain    Behavior During Therapy WFL for tasks assessed/performed           Past Medical History:  Diagnosis Date  . Anemia   . Anxiety   . Bartholin cyst   . Cervical cancer ([place])   . Constipation   . Depression   . Deviated septum   . Diabetes mellitus without complication ([place])   . Difficult intravenous access    used port for 05-09-2019 surgery  . Fibroids   . History of blood transfusion    2 units given 04-26-2019, has had total of 8 units since jan 2021  . History of blood transfusion 1 unit given 05-30-19   iv fluids also given  . History of kidney problems   . History of recent blood transfusion 05/16/2019   2 units given  per dr Pollyann Savoy at cancer center  . Hypertension   . Neuropathy    both thumbs  . Obesity   . Palpitations   . PONV (postoperative nausea and vomiting)   . Prediabetes   . Preeclampsia 2006  . Sleep apnea    no cpap used insurance would not cover, osa severe per Tamara Osborne  . SOB (shortness of breath)   . Swallowing difficulty     Past Surgical History:  Procedure Laterality Date  . CESAREAN SECTION WITH BILATERAL TUBAL LIGATION  11/10/2004      . DILATION AND CURETTAGE OF UTERUS  1997  . IR IMAGING GUIDED PORT INSERTION  04/04/2019  . OPERATIVE ULTRASOUND N/A 05/09/2019   Procedure: OPERATIVE  ULTRASOUND;  Surgeon: Gery Pray, MD;  [facility]  Service: Urology;  Laterality: N/A;  . OPERATIVE ULTRASOUND N/A 05/15/2019   Procedure: OPERATIVE ULTRASOUND;  Surgeon: Gery Pray, MD;  Location: [facility];  Service: Urology;  Laterality: N/A;  . OPERATIVE ULTRASOUND N/A 05/22/2019   Procedure: OPERATIVE ULTRASOUND;  Surgeon: Gery Pray, MD;  Location: [facility];  Service: Urology;  Laterality: N/A;  . OPERATIVE ULTRASOUND N/A 05/29/2019   Procedure: OPERATIVE ULTRASOUND;  Surgeon: Gery Pray, MD;  Location: [facility];  Service: Urology;  Laterality: N/A;  . OPERATIVE ULTRASOUND N/A 06/05/2019   Procedure: OPERATIVE ULTRASOUND;  Surgeon: Gery Pray, MD;  Location: [facility];  Service: Urology;  Laterality: N/A;  . TANDEM RING INSERTION N/A 05/09/2019   Procedure: TANDEM RING INSERTION;  Surgeon: Gery Pray, MD;  Location: [facility];  Service: Urology;  Laterality: N/A;  . TANDEM RING INSERTION N/A 05/15/2019   Procedure: TANDEM RING INSERTION;  Surgeon: Gery Pray, MD;  Location: [facility];  Service: Urology;  Laterality: N/A;  . TANDEM RING INSERTION N/A 05/22/2019   Procedure: TANDEM RING INSERTION;  Surgeon: Gery Pray, MD;  Location: [place];  Service: Urology;  Laterality: N/A;  . TANDEM RING INSERTION N/A 05/29/2019   Procedure: TANDEM RING INSERTION;  Surgeon: Gery Pray, MD;  Location: [facility];  Service: Urology;  Laterality: N/A;  . TANDEM RING INSERTION N/A 06/05/2019   Procedure: TANDEM RING INSERTION;  Surgeon: Gery Pray, MD;  Location: [facility];  Service: Urology;  Laterality: N/A;    There were no vitals filed for this visit.   Subjective Assessment - 05/09/20 1108    Subjective [place]. I have been trying to get my exercise clothes on to get me  motivated. I have done the larger dilator and I was able to put 3/4 into the vaginal canal. I have tried the [employer].    Pertinent History Cervical cancer; radiation 03/23/2019-06/05/2019    Patient Stated Goals improve urinary incontinence, help with use of dilators    Currently in Pain? No/denies                             [facility] Tamara Osborne Treatment/Exercise - 05/09/20 0001      Self-Care   Self-Care Other Self-Care Comments    Other Self-Care Comments  talked to patient about using the [place] [employer] with the dilator , continuing to use the dilator for the rest of her life; trying to have intimate time with her partner      Lumbar Exercises: Aerobic   Stationary Bike level 3 for 8 minutes  while assessing patient      Lumbar Exercises: Standing   Other Standing Lumbar Exercises standing and one foot touches the different colors to challenge balance and memory; standing an moving the legs out and out ten in and in for balance; stepping forward and back to challange her balance    Other Standing Lumbar Exercises walking backwards 15 feet 4 times                    Tamara Osborne Short Term Goals - 01/11/20 0948      Tamara Osborne SHORT TERM GOAL #1   Title independent with initial HEP    Time 4    Period Weeks    Status Achieved      Tamara Osborne SHORT TERM GOAL #2   Title educated on vaginal moisturizers to improve tissue integrity    Baseline educated    Time 4    Period Weeks    Status Achieved      Tamara Osborne SHORT TERM GOAL #3   Title educated on ways to use dilators to not increase abdominal pressure so they will stay in the vaginal canal    Baseline educated    Time 4    Period Weeks    Status Achieved             Tamara Osborne Long Term Goals - 05/02/20 1113      Tamara Osborne LONG TERM GOAL #1   Title independent with advanced exercises for strength, flexibility, and endurance    Baseline advance as she changes    Time 10    Period Weeks    Status On-going      Tamara Osborne  LONG TERM GOAL #2   Title able to use the largest dilator with no pain so she is able to having penile penetration with minimal to no difficulty    Baseline on the second to last dilator  Time 10    Period Weeks    Status On-going      Tamara Osborne LONG TERM GOAL #3   Title urinary leakage decreased >/= 75% and down to 0-1 thin pad due to increased pelvic floor strength and increased tissue mobility    Baseline will leak when she has the urge and has a distance to walk    Time 10    Period Weeks      Tamara Osborne LONG TERM GOAL #4   Title able to walk for 20 minutes in the store and for exercise without having to go to the bathroom due to increased endrance and pelvic floor strength    Period Weeks    Status Achieved      Tamara Osborne LONG TERM GOAL #5   Baseline 11.93    Time 10    Period Weeks    Status Achieved                 Plan - 05/09/20 1143    Clinical Impression Statement Patient is able to place the largest dilator into the vaginal canal 3/4. Patient had not has not been been intimate with her partner yet and will try this weekend. Patient is walking more. She is able to walk backwards with increased steadiness. Patient balance was challenged today. Patient will benefit from skilled therapy to increase pelvic floor outlet for penile penetration and increase her core strength.    Personal Factors and Comorbidities Age;Fitness;Comorbidity 3+;Sex;Time since onset of injury/illness/exacerbation    Comorbidities Cervical cancer 03/18/2019 with radiation 03/23/2019-06/05/2019; Diabetes; C-section 11/11/2014; nueropathy in toes    Examination-Activity Limitations Locomotion Level;Bed Mobility;Squat;Continence;Stairs;Stand;Lift;Toileting    Examination-Participation Restrictions Meal Prep;Cleaning;Community Activity;Interpersonal Relationship;Laundry    Stability/Clinical Decision Making Evolving/Moderate complexity    Rehab Potential Good    Tamara Osborne Frequency 1x / week    Tamara Osborne Duration 8 weeks    Tamara Osborne  Treatment/Interventions ADLs/Self Care Home Management;Biofeedback;Functional mobility training;Therapeutic activities;Therapeutic exercise;Balance training;Neuromuscular re-education;Manual techniques;Patient/family education;Dry needling;Energy conservation;Passive range of motion;Spinal Manipulations;Joint Manipulations    Tamara Osborne Next Visit Plan core strength and endurance    Tamara Osborne Home Exercise Plan Access Code: [email or web address]    Consulted and Agree with Plan of Care Patient           Patient will benefit from skilled therapeutic intervention in order to improve the following deficits and impairments:  Decreased coordination,Decreased range of motion,Difficulty walking,Increased fascial restricitons,Decreased endurance,Increased muscle spasms,Pain,Decreased activity tolerance,Decreased balance,Impaired flexibility,Decreased strength,Decreased mobility  Visit Diagnosis: Muscle weakness (generalized)  Cramp and spasm  Other urinary incontinence     Problem List Patient Active Problem List   Diagnosis Date Noted  . Sinus congestion 04/16/2020  . Port-A-Cath in place 01/22/2020  . Mixed hyperlipidemia 01/10/2020  . Vitamin D deficiency 01/10/2020  . Insulin resistance 01/10/2020  . Class 3 severe obesity with serious comorbidity and body mass index (BMI) of 40.0 to 44.9 in adult [facility]) 01/10/2020  . Obesity, Class II, BMI 35-39.9 09/07/2019  . Dehydration 05/30/2019  . Peripheral neuropathy due to chemotherapy ([place]) 04/25/2019  . Diarrhea 04/04/2019  . CKD (chronic kidney disease), symptom management only, stage 3 (moderate) ([place]) 03/30/2019  . Generalized anxiety disorder 03/30/2019  . Essential hypertension   . Anxiety 03/23/2019  . Squamous cell carcinoma of cervix ([place]) 03/23/2019  . Deficiency anemia 03/22/2019  . Squamous cell carcinoma in situ (SCCIS) of cervix 03/22/2019    Tamara Osborne, Tamara Osborne 05/09/20 11:51 AM   [facility] [address],  [place], [place], [address] Phone: [phone]   Fax:  [phone]  Name: Tamara Osborne MRN: [id number] Date of Birth: [birth year]

## 2020-05-16 ENCOUNTER — Encounter: Payer: Self-pay | Admitting: Physical Therapy

## 2020-05-16 ENCOUNTER — Other Ambulatory Visit: Payer: Self-pay

## 2020-05-16 ENCOUNTER — Ambulatory Visit: Payer: Medicaid Other | Admitting: Physical Therapy

## 2020-05-16 DIAGNOSIS — R2689 Other abnormalities of gait and mobility: Secondary | ICD-10-CM

## 2020-05-16 DIAGNOSIS — N39498 Other specified urinary incontinence: Secondary | ICD-10-CM

## 2020-05-16 DIAGNOSIS — M6281 Muscle weakness (generalized): Secondary | ICD-10-CM | POA: Diagnosis not present

## 2020-05-16 DIAGNOSIS — R252 Cramp and spasm: Secondary | ICD-10-CM

## 2020-05-16 NOTE — Therapy (Signed)
[facility] [address] [place], [place], [address] Phone: [phone]   Fax:  [phone]  Physical Therapy Treatment  Patient Details  Name: Tamara Osborne MRN: [id number] Date of Birth: [birth year] Referring Provider (PT): Dr. Gery Pray   Encounter Date: 05/16/2020   PT End of Session - 05/16/20 1100    Visit Number 18    Date for PT Re-Evaluation 06/20/20    Authorization Type [employer]    Authorization Time Period 1/24-4/4    Authorization - Visit Number 4    Authorization - Number of Visits 6    PT Start Time 1100    PT Stop Time 4562    PT Time Calculation (min) 38 min    Activity Tolerance Patient tolerated treatment well;No increased pain    Behavior During Therapy WFL for tasks assessed/performed           Past Medical History:  Diagnosis Date  . Anemia   . Anxiety   . Bartholin cyst   . Cervical cancer ([place])   . Constipation   . Depression   . Deviated septum   . Diabetes mellitus without complication ([place])   . Difficult intravenous access    used port for 05-09-2019 surgery  . Fibroids   . History of blood transfusion    2 units given 04-26-2019, has had total of 8 units since jan 2021  . History of blood transfusion 1 unit given 05-30-19   iv fluids also given  . History of kidney problems   . History of recent blood transfusion 05/16/2019   2 units given  per dr Pollyann Savoy at cancer center  . Hypertension   . Neuropathy    both thumbs  . Obesity   . Palpitations   . PONV (postoperative nausea and vomiting)   . Prediabetes   . Preeclampsia 2006  . Sleep apnea    no cpap used insurance would not cover, osa severe per pt  . SOB (shortness of breath)   . Swallowing difficulty     Past Surgical History:  Procedure Laterality Date  . CESAREAN SECTION WITH BILATERAL TUBAL LIGATION  11/10/2004      . DILATION AND CURETTAGE OF UTERUS  1997  . IR IMAGING GUIDED PORT INSERTION   04/04/2019  . OPERATIVE ULTRASOUND N/A 05/09/2019   Procedure: OPERATIVE ULTRASOUND;  Surgeon: Gery Pray, MD;  Location: [facility];  Service: Urology;  Laterality: N/A;  . OPERATIVE ULTRASOUND N/A 05/15/2019   Procedure: OPERATIVE ULTRASOUND;  Surgeon: Gery Pray, MD;  Location: [facility];  Service: Urology;  Laterality: N/A;  . OPERATIVE ULTRASOUND N/A 05/22/2019   Procedure: OPERATIVE ULTRASOUND;  Surgeon: Gery Pray, MD;  Location: [facility];  Service: Urology;  Laterality: N/A;  . OPERATIVE ULTRASOUND N/A 05/29/2019   Procedure: OPERATIVE ULTRASOUND;  Surgeon: Gery Pray, MD;  Location[facility];  Service: Urology;  Laterality: N/A;  . OPERATIVE ULTRASOUND N/A 06/05/2019   Procedure: OPERATIVE ULTRASOUND;  Surgeon: Gery Pray, MD;  Location: [facility];  Service: Urology;  Laterality: N/A;  . TANDEM RING INSERTION N/A 05/09/2019   Procedure: TANDEM RING INSERTION;  Surgeon: Gery Pray, MD;  Location: [facility]  Service: Urology;  Laterality: N/A;  . TANDEM RING INSERTION N/A 05/15/2019   Procedure: TANDEM RING INSERTION;  Surgeon: Gery Pray, MD;  Location: [facility];  Service: Urology;  Laterality: N/A;  . TANDEM RING  INSERTION N/A 05/22/2019   Procedure: TANDEM RING INSERTION;  Surgeon: Gery Pray, MD;  Location: [facility];  Service: Urology;  Laterality: N/A;  . TANDEM RING INSERTION N/A 05/29/2019   Procedure: TANDEM RING INSERTION;  Surgeon: Gery Pray, MD;  Location: [facility];  Service: Urology;  Laterality: N/A;  . TANDEM RING INSERTION N/A 06/05/2019   Procedure: TANDEM RING INSERTION;  Surgeon: Gery Pray, MD;  Location[facility];  Service: Urology;  Laterality: N/A;    There were no vitals filed for this visit.   Subjective Assessment - 05/16/20 1104    Subjective I am doing more exercise now  and have more energy. I am using the bigger dilator with going in 3/4. Has not had intercourse yet.    Pertinent History Cervical cancer; radiation 03/23/2019-06/05/2019    Patient Stated Goals improve urinary incontinence, help with use of dilators    Currently in Pain? No/denies              [facility] PT Assessment - 05/16/20 0001      Assessment   Medical Diagnosis C53.9 Squamous cell carcinoma of cervix    Referring Provider (PT) Dr. Gery Pray    Onset Date/Surgical Date 03/18/19    Prior Therapy none      Precautions   Precautions Other (comment)    Precaution Comments cervical cancer with radiation      Restrictions   Weight Bearing Restrictions No      Home Environment   Living Environment Private residence      Prior Function   Level of Independence Independent      Cognition   Overall Cognitive Status Within Functional Limits for tasks assessed      Posture/Postural Control   Posture/Postural Control Postural limitations    Posture Comments protruding abdomen, short waisted      PROM   Right Hip Internal Rotation  20    Left Hip Internal Rotation  20      Strength   Right Hip Flexion 5/5    Right Hip Extension 4/5    Right Hip External Rotation  5/5    Right Hip ABduction 4+/5    Left Hip Flexion 5/5    Left Hip Extension 4/5    Left Hip External Rotation 5/5    Left Hip ABduction 5/5                      Pelvic Floor Special Questions - 05/16/20 0001    Strength fair squeeze, definite lift             [facility] Adult PT Treatment/Exercise - 05/16/20 0001      Lumbar Exercises: Stretches   Active Hamstring Stretch Right;Left;1 rep;30 seconds    Active Hamstring Stretch Limitations sitting    Piriformis Stretch Right;Left;1 rep;30 seconds    Piriformis Stretch Limitations sitting    Other Lumbar Stretch Exercise hip adductor right, left in sitting 30 sec      Lumbar Exercises: Aerobic   Nustep level 3 for 8 minutes while asessing  patient for progress      Lumbar Exercises: Standing   Other Standing Lumbar Exercises walking backwards 15 feet 4 times      Knee/Hip Exercises: Standing   Heel Raises Both;1 set;20 reps;1 second    Other Standing Knee Exercises sit to stand 10x for balance    Other Standing Knee Exercises tandem stance 30 sec 6 times  PT Education - 05/16/20 1140    Education Details verbally reviewed HEP    Person(s) Educated Patient    Methods Explanation    Comprehension Verbalized understanding            PT Short Term Goals - 05/16/20 1118      PT SHORT TERM GOAL #1   Title independent with initial HEP    Time 4    Period Weeks    Status Achieved      PT SHORT TERM GOAL #2   Title educated on vaginal moisturizers to improve tissue integrity    Time 4    Period Weeks    Status Achieved      PT SHORT TERM GOAL #3   Title educated on ways to use dilators to not increase abdominal pressure so they will stay in the vaginal canal    Time 4    Period Weeks    Status Achieved             PT Long Term Goals - 05/16/20 1119      PT LONG TERM GOAL #1   Title independent with advanced exercises for strength, flexibility, and endurance    Time 10    Period Weeks    Status Achieved      PT LONG TERM GOAL #2   Title able to use the largest dilator with no pain so she is able to having penile penetration with minimal to no difficulty    Time 10    Period Weeks    Status Achieved      PT LONG TERM GOAL #3   Title urinary leakage decreased >/= 75% and down to 0-1 thin pad due to increased pelvic floor strength and increased tissue mobility    Time 10    Period Weeks    Status Achieved      PT LONG TERM GOAL #4   Title able to walk for 20 minutes in the store and for exercise without having to go to the bathroom due to increased endrance and pelvic floor strength    Time 10    Period Weeks    Status Achieved      PT LONG TERM GOAL #5   Title sit to  stand </= 12 seconds due to improved strength and balance to reduce her risk of falls    Time 10    Period Weeks    Status Achieved                 Plan - 05/16/20 1140    Clinical Impression Statement Patient is on the last dilator and it is abl eto go into the vaginal canal 3/4 of the way. Patient has not had the opportunity to have intercourse yet. Patient will leak urine when she sneezes. Patient is walking 20 minutes or more at a time. She is steadier on her feet. Patient has increased strength. Patient is independent with her current HEP. She is able to go from sit to stand under 12 seconds. Patient is ready for discharge.    Personal Factors and Comorbidities Age;Fitness;Comorbidity 3+;Sex;Time since onset of injury/illness/exacerbation    Comorbidities Cervical cancer 03/18/2019 with radiation 03/23/2019-06/05/2019; Diabetes; C-section 11/11/2014; nueropathy in toes    Examination-Activity Limitations Locomotion Level;Bed Mobility;Squat;Continence;Stairs;Stand;Lift;Toileting    Examination-Participation Restrictions Meal Prep;Cleaning;Community Activity;Interpersonal Relationship;Laundry    Stability/Clinical Decision Making Evolving/Moderate complexity    Rehab Potential Good    PT Treatment/Interventions ADLs/Self Care Home Management;Biofeedback;Functional mobility training;Therapeutic activities;Therapeutic exercise;Balance  training;Neuromuscular re-education;Manual techniques;Patient/family education;Dry needling;Energy conservation;Passive range of motion;Spinal Manipulations;Joint Manipulations    PT Next Visit Plan Discharge to HEP    PT Home Exercise Plan Access Code: [email or web address]    Consulted and Agree with Plan of Care Patient           Patient will benefit from skilled therapeutic intervention in order to improve the following deficits and impairments:  Decreased coordination,Decreased range of motion,Difficulty walking,Increased fascial restricitons,Decreased  endurance,Increased muscle spasms,Pain,Decreased activity tolerance,Decreased balance,Impaired flexibility,Decreased strength,Decreased mobility  Visit Diagnosis: Muscle weakness (generalized)  Cramp and spasm  Other urinary incontinence  Other abnormalities of gait and mobility     Problem List Patient Active Problem List   Diagnosis Date Noted  . Sinus congestion 04/16/2020  . Port-A-Cath in place 01/22/2020  . Mixed hyperlipidemia 01/10/2020  . Vitamin D deficiency 01/10/2020  . Insulin resistance 01/10/2020  . Class 3 severe obesity with serious comorbidity and body mass index (BMI) of 40.0 to 44.9 in adult [facility]) 01/10/2020  . Obesity, Class II, BMI 35-39.9 09/07/2019  . Dehydration 05/30/2019  . Peripheral neuropathy due to chemotherapy ([place]) 04/25/2019  . Diarrhea 04/04/2019  . CKD (chronic kidney disease), symptom management only, stage 3 (moderate) ([place]) 03/30/2019  . Generalized anxiety disorder 03/30/2019  . Essential hypertension   . Anxiety 03/23/2019  . Squamous cell carcinoma of cervix ([place]) 03/23/2019  . Deficiency anemia 03/22/2019  . Squamous cell carcinoma in situ (SCCIS) of cervix 03/22/2019    Earlie Counts, PT 05/16/20 11:44 AM   [facility] [address] [place], [place], [address] Phone: [phone]   Fax:  [phone]  Name: MCKINLEE DUNK MRN: [id number] Date of Birth: 04-27-1975  PHYSICAL THERAPY DISCHARGE SUMMARY  Visits from Start of Care: 18  Current functional level related to goals / functional outcomes: See above.    Remaining deficits: See above.    Education / Equipment: HEP Plan: Patient agrees to discharge.  Patient goals were met. Patient is being discharged due to meeting the stated rehab goals.  Thank you for the referral. Earlie Counts, PT 05/16/20 11:45 AM  ?????

## 2020-05-20 ENCOUNTER — Other Ambulatory Visit: Payer: Self-pay

## 2020-05-20 ENCOUNTER — Inpatient Hospital Stay: Payer: Medicaid Other | Attending: Hematology and Oncology

## 2020-05-20 ENCOUNTER — Ambulatory Visit (INDEPENDENT_AMBULATORY_CARE_PROVIDER_SITE_OTHER): Payer: Medicaid Other | Admitting: Adult Health

## 2020-05-20 ENCOUNTER — Inpatient Hospital Stay: Payer: Medicaid Other

## 2020-05-20 DIAGNOSIS — E669 Obesity, unspecified: Secondary | ICD-10-CM | POA: Insufficient documentation

## 2020-05-20 DIAGNOSIS — C539 Malignant neoplasm of cervix uteri, unspecified: Secondary | ICD-10-CM | POA: Insufficient documentation

## 2020-05-20 DIAGNOSIS — E8881 Metabolic syndrome: Secondary | ICD-10-CM

## 2020-05-20 DIAGNOSIS — Z95828 Presence of other vascular implants and grafts: Secondary | ICD-10-CM

## 2020-05-20 DIAGNOSIS — E559 Vitamin D deficiency, unspecified: Secondary | ICD-10-CM

## 2020-05-20 LAB — CMP (CANCER CENTER ONLY)
ALT: 38 U/L (ref 0–44)
AST: 20 U/L (ref 15–41)
Albumin: 3.7 g/dL (ref 3.5–5.0)
Alkaline Phosphatase: 95 U/L (ref 38–126)
Anion gap: 4 — ABNORMAL LOW (ref 5–15)
BUN: 21 mg/dL — ABNORMAL HIGH (ref 6–20)
CO2: 24 mmol/L (ref 22–32)
Calcium: 9 mg/dL (ref 8.9–10.3)
Chloride: 106 mmol/L (ref 98–111)
Creatinine: 1.33 mg/dL — ABNORMAL HIGH (ref 0.44–1.00)
GFR, Estimated: 51 mL/min — ABNORMAL LOW (ref >60)
Glucose, Bld: 87 mg/dL (ref 70–99)
Potassium: 4.1 mmol/L (ref 3.5–5.1)
Sodium: 134 mmol/L — ABNORMAL LOW (ref 135–145)
Total Bilirubin: 0.4 mg/dL (ref 0.3–1.2)
Total Protein: 7.5 g/dL (ref 6.5–8.1)

## 2020-05-20 LAB — LIPID PANEL
Cholesterol: 214 mg/dL — ABNORMAL HIGH (ref 0–200)
HDL: 32 mg/dL — ABNORMAL LOW (ref >40)
LDL Cholesterol: 145 mg/dL — ABNORMAL HIGH (ref 0–99)
Total CHOL/HDL Ratio: 6.7 RATIO
Triglycerides: 184 mg/dL — ABNORMAL HIGH (ref <150)
VLDL: 37 mg/dL (ref 0–40)

## 2020-05-20 LAB — VITAMIN D 25 HYDROXY (VIT D DEFICIENCY, FRACTURES): Vit D, 25-Hydroxy: 45.6 ng/mL (ref 30–100)

## 2020-05-20 LAB — HEMOGLOBIN A1C
Hgb A1c MFr Bld: 6 % — ABNORMAL HIGH (ref 4.8–5.6)
Mean Plasma Glucose: 125.5 mg/dL

## 2020-05-20 MED ORDER — HEPARIN SOD (PORK) LOCK FLUSH 100 UNIT/ML IV SOLN
500.0000 [IU] | Freq: Once | INTRAVENOUS | Status: AC
Start: 2020-05-20 — End: 2020-05-20
  Administered 2020-05-20: 500 [IU]
  Filled 2020-05-20: qty 5

## 2020-05-20 MED ORDER — SODIUM CHLORIDE 0.9% FLUSH
10.0000 mL | Freq: Once | INTRAVENOUS | Status: AC
Start: 2020-05-20 — End: 2020-05-20
  Administered 2020-05-20: 10 mL
  Filled 2020-05-20: qty 10

## 2020-05-20 NOTE — Patient Instructions (Signed)
Implanted Port Insertion, Care After This sheet gives you information about how to care for yourself after your procedure. Your health care provider may also give you more specific instructions. If you have problems or questions, contact your health care provider. What can I expect after the procedure? After the procedure, it is common to have:  Discomfort at the port insertion site.  Bruising on the skin over the port. This should improve over 3-4 days. Follow these instructions at home: Port care  After your port is placed, you will get a manufacturer's information card. The card has information about your port. Keep this card with you at all times.  Take care of the port as told by your health care provider. Ask your health care provider if you or a family member can get training for taking care of the port at home. A home health care nurse may also take care of the port.  Make sure to remember what type of port you have. Incision care  Follow instructions from your health care provider about how to take care of your port insertion site. Make sure you: ? Wash your hands with soap and water before and after you change your bandage (dressing). If soap and water are not available, use hand sanitizer. ? Change your dressing as told by your health care provider. ? Leave stitches (sutures), skin glue, or adhesive strips in place. These skin closures may need to stay in place for 2 weeks or longer. If adhesive strip edges start to loosen and curl up, you may trim the loose edges. Do not remove adhesive strips completely unless your health care provider tells you to do that.  Check your port insertion site every day for signs of infection. Check for: ? Redness, swelling, or pain. ? Fluid or blood. ? Warmth. ? Pus or a bad smell.      Activity  Return to your normal activities as told by your health care provider. Ask your health care provider what activities are safe for you.  Do not  lift anything that is heavier than 10 lb (4.5 kg), or the limit that you are told, until your health care provider says that it is safe. General instructions  Take over-the-counter and prescription medicines only as told by your health care provider.  Do not take baths, swim, or use a hot tub until your health care provider approves. Ask your health care provider if you may take showers. You may only be allowed to take sponge baths.  Do not drive for 24 hours if you were given a sedative during your procedure.  Wear a medical alert bracelet in case of an emergency. This will tell any health care providers that you have a port.  Keep all follow-up visits as told by your health care provider. This is important. Contact a health care provider if:  You cannot flush your port with saline as directed, or you cannot draw blood from the port.  You have a fever or chills.  You have redness, swelling, or pain around your port insertion site.  You have fluid or blood coming from your port insertion site.  Your port insertion site feels warm to the touch.  You have pus or a bad smell coming from the port insertion site. Get help right away if:  You have chest pain or shortness of breath.  You have bleeding from your port that you cannot control. Summary  Take care of the port as told by your   health care provider. Keep the manufacturer's information card with you at all times.  Change your dressing as told by your health care provider.  Contact a health care provider if you have a fever or chills or if you have redness, swelling, or pain around your port insertion site.  Keep all follow-up visits as told by your health care provider. This information is not intended to replace advice given to you by your health care provider. Make sure you discuss any questions you have with your health care provider. Document Revised: 09/14/2017 Document Reviewed: 09/14/2017 Elsevier Patient Education   2021 Elsevier Inc.  

## 2020-05-21 ENCOUNTER — Encounter (INDEPENDENT_AMBULATORY_CARE_PROVIDER_SITE_OTHER): Payer: Self-pay | Admitting: Family Medicine

## 2020-05-21 LAB — INSULIN, RANDOM: Insulin: 53.5 u[IU]/mL — ABNORMAL HIGH (ref 2.6–24.9)

## 2020-05-21 NOTE — Telephone Encounter (Signed)
Please advise 

## 2020-05-22 ENCOUNTER — Encounter (INDEPENDENT_AMBULATORY_CARE_PROVIDER_SITE_OTHER): Payer: Self-pay | Admitting: Family Medicine

## 2020-05-22 NOTE — Telephone Encounter (Signed)
Please advise 

## 2020-05-23 ENCOUNTER — Encounter (INDEPENDENT_AMBULATORY_CARE_PROVIDER_SITE_OTHER): Payer: Self-pay | Admitting: Adult Health

## 2020-05-23 ENCOUNTER — Encounter: Payer: Medicaid Other | Admitting: Physical Therapy

## 2020-05-23 ENCOUNTER — Telehealth (INDEPENDENT_AMBULATORY_CARE_PROVIDER_SITE_OTHER): Payer: Medicaid Other | Admitting: Adult Health

## 2020-05-23 ENCOUNTER — Other Ambulatory Visit: Payer: Self-pay

## 2020-05-23 DIAGNOSIS — R110 Nausea: Secondary | ICD-10-CM

## 2020-05-23 DIAGNOSIS — N183 Chronic kidney disease, stage 3 unspecified: Secondary | ICD-10-CM

## 2020-05-23 DIAGNOSIS — E7849 Other hyperlipidemia: Secondary | ICD-10-CM | POA: Diagnosis not present

## 2020-05-23 DIAGNOSIS — R7303 Prediabetes: Secondary | ICD-10-CM | POA: Diagnosis not present

## 2020-05-23 DIAGNOSIS — Z6839 Body mass index (BMI) 39.0-39.9, adult: Secondary | ICD-10-CM

## 2020-05-23 NOTE — Telephone Encounter (Signed)
FYI

## 2020-05-24 ENCOUNTER — Ambulatory Visit: Payer: Medicaid Other | Admitting: Gynecologic Oncology

## 2020-05-27 ENCOUNTER — Telehealth: Payer: Self-pay | Admitting: Neurology

## 2020-05-27 DIAGNOSIS — R7303 Prediabetes: Secondary | ICD-10-CM | POA: Insufficient documentation

## 2020-05-27 NOTE — Telephone Encounter (Signed)
Pt called, Insurance not paying for CPAP machine because I have not seen the physician. [employer] said need a earlier than 4/26 in order for insurance to pay for CPAP machine. Would like a call from the nurse.

## 2020-05-27 NOTE — Telephone Encounter (Addendum)
I have reached out to [employer].

## 2020-05-27 NOTE — Progress Notes (Signed)
TeleHealth Visit:  Due to the COVID-19 pandemic, this visit was completed with telemedicine (audio/video) technology to reduce patient and provider exposure as well as to preserve personal protective equipment.   Tamara Osborne has verbally consented to this TeleHealth visit. The patient is located at home, the provider is located at the [facility] office. The participants in this visit include the listed provider and patient. The visit was conducted today via video.   Chief Complaint: OBESITY Tamara Osborne is here to discuss her progress with her obesity treatment plan along with follow-up of her obesity related diagnoses. Tamara Osborne is on the Category 2 Plan and states she is following her eating plan approximately 95% of the time. Tamara Osborne states she is doing physical therapy 2 times per week.  Today's visit was #: 16 Starting weight: 208 lbs Starting date: 09/20/2019  Interim History: Tamara Osborne has been experiencing nausea without vomiting and loose stools for about the last 36 hours. She reports stable appetite now, but it was poor yesterday. She denies known exposure to COVID-19. She had 2 negative home COVID-19 antigen tests.  Subjective:   1. Nausea without vomiting Tamara Osborne reports nausea without vomiting with associated loose stools for the last 36 hours. She denies hematochezia. She has experienced abdominal cramping. She denies fever, however, she experienced chills yesterday.  2. Other hyperlipidemia Worsening. Discussed labs with patient today. Tamara Osborne's 05/20/2020 lipid panel resulted total 214, triglycerides 184, HDL 32, and LDL 145. She has an ASCVD risk score 2.2%.  Lab Results  Component Value Date   CHOL 214 (H) 05/20/2020   HDL 32 (L) 05/20/2020   LDLCALC 145 (H) 05/20/2020   TRIG 184 (H) 05/20/2020   CHOLHDL 6.7 05/20/2020   Lab Results  Component Value Date   ALT 38 05/20/2020   AST 20 05/20/2020   ALKPHOS 95 05/20/2020   BILITOT 0.4 05/20/2020    The 10-year ASCVD risk score Mikey Bussing DC Jr., et al., 2013) is: 2.2%   Values used to calculate the score:     Age: 45 years     Sex: Female     Is Non-Hispanic African American: No     Diabetic: No     Tobacco smoker: No     Systolic Blood Pressure: 035 mmHg     Is BP treated: Yes     HDL Cholesterol: 32 mg/dL     Total Cholesterol: 214 mg/dL  3. Pre-diabetes Worsening. Discussed labs with patient today. Tamara Osborne's 05/20/2020 A1c worsened. She is on Victoza 1.8 mg QD. She denies mass in neck, dysphagia, dyspepsia, or persistent hoarseness. She denies GI upset. She denies family history of MTC. She denies personal history of pancreatitis.   4. CKD (chronic kidney disease), symptom management only, stage 3 (moderate) ([place]) Discussed labs with patient today. Tamara Osborne's 05/20/2020 CMP resulted GFR 51, which is improved from 44 on 01/22/2020.   Lab Results  Component Value Date   CREATININE 1.33 (H) 05/20/2020   CREATININE 1.49 (H) 01/22/2020   CREATININE 1.35 (H) 09/20/2019   Lab Results  Component Value Date   CREATININE 1.33 (H) 05/20/2020   BUN 21 (H) 05/20/2020   NA 134 (L) 05/20/2020   K 4.1 05/20/2020   CL 106 05/20/2020   CO2 24 05/20/2020    Assessment/Plan:   1. Nausea without vomiting Utilize BRAT eating plan.  2. Other hyperlipidemia Cardiovascular risk and specific lipid/LDL goals reviewed.  We discussed several lifestyle modifications today and [name] will continue to work on diet, exercise  and weight loss efforts. Orders and follow up as documented in patient record. Reduce saturated fat. Check labs every 3 months.  Counseling Intensive lifestyle modifications are the first line treatment for this issue. . Dietary changes: Increase soluble fiber. Decrease simple carbohydrates. . Exercise changes: Moderate to vigorous-intensity aerobic activity 150 minutes per week if tolerated. . Lipid-lowering medications: see documented in medical record.  3.  Pre-diabetes Tamara Osborne will continue to work on weight loss, exercise, and decreasing simple carbohydrates to help decrease the risk of diabetes. Continue Victoza 1.8 mg daily and Cat 2 meal plan.  4. CKD (chronic kidney disease), symptom management only, stage 3 (moderate) ([facility]) Lab results and trends reviewed. We discussed several lifestyle modifications today and she will continue to work on diet, exercise and weight loss efforts. Orders and follow up as documented in patient record. Avoid nephrotoxic medications.  Counseling . Chronic kidney disease (CKD) happens when the kidneys are damaged over a long period of time. . Most of the time, this condition does not go away, but it can usually be controlled. Steps must be taken to slow down the kidney damage or to stop it from getting worse. . Intensive lifestyle modifications are the first line treatment for this issue.  Marland Kitchen Avoid buying foods that are: processed, frozen, or prepackaged to avoid excess salt.  5. Class 2 severe obesity with serious comorbidity and body mass index (BMI) of 39.0 to 39.9 in adult, unspecified obesity type (H[facility]) Tamara Osborne is currently in the action stage of change. As such, her goal is to continue with weight loss efforts. She has agreed to the Category 2 Plan.   Exercise goals: PT twice a week  Behavioral modification strategies: increasing lean protein intake, decreasing simple carbohydrates, no skipping meals, meal planning and cooking strategies and planning for success.  Tamara Osborne has agreed to follow-up with our clinic in 2 weeks. She was informed of the importance of frequent follow-up visits to maximize her success with intensive lifestyle modifications for her multiple health conditions.  Objective:   VITALS: Per patient if applicable, see vitals. GENERAL: Alert and in no acute distress. CARDIOPULMONARY: No increased WOB. Speaking in clear sentences.  PSYCH: Pleasant and cooperative. Speech normal rate and  rhythm. Affect is appropriate. Insight and judgement are appropriate. Attention is focused, linear, and appropriate.  NEURO: Oriented as arrived to appointment on time with no prompting.   Lab Results  Component Value Date   CREATININE 1.33 (H) 05/20/2020   BUN 21 (H) 05/20/2020   NA 134 (L) 05/20/2020   K 4.1 05/20/2020   CL 106 05/20/2020   CO2 24 05/20/2020   Lab Results  Component Value Date   ALT 38 05/20/2020   AST 20 05/20/2020   ALKPHOS 95 05/20/2020   BILITOT 0.4 05/20/2020   Lab Results  Component Value Date   HGBA1C 6.0 (H) 05/20/2020   HGBA1C 5.2 10/30/2019   HGBA1C CANCELED 09/20/2019   Lab Results  Component Value Date   INSULIN 40.3 (H) 09/20/2019   Lab Results  Component Value Date   TSH 1.910 09/20/2019   Lab Results  Component Value Date   CHOL 214 (H) 05/20/2020   HDL 32 (L) 05/20/2020   LDLCALC 145 (H) 05/20/2020   TRIG 184 (H) 05/20/2020   CHOLHDL 6.7 05/20/2020   Lab Results  Component Value Date   WBC 6.6 08/07/2019   HGB 11.8 (L) 08/07/2019   HCT 34.8 (L) 08/07/2019   MCV 87.7 08/07/2019   PLT 267  08/07/2019   Lab Results  Component Value Date   IRON 17 (L) 03/27/2019   TIBC 283 03/27/2019   FERRITIN 32 03/27/2019    Attestation Statements:   Reviewed by clinician on day of visit: allergies, medications, problem list, medical history, surgical history, family history, social history, and previous encounter notes.  Time spent on visit including pre-visit chart review and post-visit charting and care was 34 minutes.   Coral Ceo, am acting as Location manager for Mina Marble, NP.  I have reviewed the above documentation for accuracy and completeness, and I agree with the above. - Tahnee Cifuentes d. Crucita Lacorte, NP-C

## 2020-05-28 NOTE — Telephone Encounter (Signed)
Tamara Osborne with [employer] reached out. She sts they are needing F2F documentation for initial f/u appt. Pt has not been scheduled for f/u yet.   Per [employer] it was started on Luna machine was shipped on 04/24/20

## 2020-05-28 NOTE — Telephone Encounter (Signed)
I have also reached out to the pt via my chart advising of this information.

## 2020-05-28 NOTE — Telephone Encounter (Signed)
Pt has been scheduled for f/u on 06/25/20.

## 2020-05-29 ENCOUNTER — Encounter: Payer: Self-pay | Admitting: Radiation Oncology

## 2020-05-29 ENCOUNTER — Encounter: Payer: Medicaid Other | Admitting: Physical Therapy

## 2020-05-29 ENCOUNTER — Ambulatory Visit (INDEPENDENT_AMBULATORY_CARE_PROVIDER_SITE_OTHER): Payer: Medicaid Other | Admitting: Adult Health

## 2020-05-30 ENCOUNTER — Ambulatory Visit: Payer: Self-pay | Admitting: Neurology

## 2020-06-03 ENCOUNTER — Encounter: Payer: Medicaid Other | Admitting: Physical Therapy

## 2020-06-06 ENCOUNTER — Ambulatory Visit
Admission: RE | Admit: 2020-06-06 | Discharge: 2020-06-06 | Disposition: A | Discharge status: Home / Self Care | Payer: Medicaid Other | Source: Ambulatory Visit | Attending: Radiation Oncology | Admitting: Radiation Oncology

## 2020-06-06 ENCOUNTER — Encounter: Payer: Self-pay | Admitting: Radiation Oncology

## 2020-06-06 ENCOUNTER — Other Ambulatory Visit: Payer: Self-pay

## 2020-06-06 VITALS — BP 142/98 | HR 106 | Temp 98.2°F | Resp 20 | Ht 61.0 in | Wt 227.2 lb

## 2020-06-06 DIAGNOSIS — Z8541 Personal history of malignant neoplasm of cervix uteri: Secondary | ICD-10-CM | POA: Diagnosis not present

## 2020-06-06 DIAGNOSIS — Z79899 Other long term (current) drug therapy: Secondary | ICD-10-CM | POA: Diagnosis not present

## 2020-06-06 DIAGNOSIS — C539 Malignant neoplasm of cervix uteri, unspecified: Secondary | ICD-10-CM

## 2020-06-06 DIAGNOSIS — G629 Polyneuropathy, unspecified: Secondary | ICD-10-CM | POA: Diagnosis not present

## 2020-06-06 DIAGNOSIS — Z923 Personal history of irradiation: Secondary | ICD-10-CM | POA: Insufficient documentation

## 2020-06-06 DIAGNOSIS — M79674 Pain in right toe(s): Secondary | ICD-10-CM | POA: Insufficient documentation

## 2020-06-06 NOTE — Progress Notes (Signed)
Radiation Oncology         [phone] ________________________________  Name: Tamara Osborne MRN: [id number]  Date: 06/06/2020  DOB: Jul 06, 1975  Follow-Up Visit Note  CC: Cory Munch, PA-C  [facility], [place] [facility]    ICD-10-CM   1. Squamous cell carcinoma of cervix (HCC)  C53.9     Diagnosis: Stage II-B squamous cell carcinoma of the cervix  Interval Since Last Radiation: One year and two days  Radiation Treatment Dates: 03/23/2019 through 06/05/2019   External beam therapy was from 03/23/2019 - 05/04/2019. HDR vaginal brachytherapy was from 05/19/2019 - 06/05/2019.  Site Technique Total Dose (Gy) Dose per Fx (Gy) Completed Fx Beam Energies  Cervix: Cervix HDR-brachy 5.5/5.5 5.5 1/1 Ir-192  Cervix: Cervix HDR-brachy 5.5/5.5 5.5 1/1 Ir-192  Cervix: Cervix_Bst HDR-brachy 5.5/5.5 5.5 1/1 Ir-192  Cervix: Cervix HDR-brachy 5.5/5.5 5.5 1/1 Ir-192  Cervix: Cervix 3D 45/45 1.8 25/25 10X, 15X  Cervix: Cervix HDR-brachy 5.5/5.5 5.5 1/1 Ir-192  Cervix: Cervix_Bst 3D 9/9 1.8 5/5 15X    Narrative:  The patient returns today for routine follow-up. She was seen by Dr. Berline Lopes on 12/14/2019, at which time a cervical biopsy was taken of an area that was mildly concerning only because of the presence of an atypical blood vessel. Pathology from the procedure revealed erosive cervicitis with reactive changes but no dysplasia or malignancy.  Of note, the patient did undergo physical therapy from 12/22/2019 to 05/16/2020 for generalized muscle weakness, cramp and spasm, urinary incontinence, and abnormalities with gait and mobility.  The patient was last seen by Dr. Berline Lopes on 03/25/2020, at which time there was no evidence of disease on examination.  Posttreatment PET scan showed no residual activity suggesting excellent response to her radiation and radiosensitizing chemotherapy.  On review of systems, she reports no pelvic pain vaginal bleeding or discharge.  She continues to use her  vaginal dilator as recommended and is using vaginal dilator that was given to her from physical therapy.   This is easier to hold and keep in place she reports.  Patient has had some problems with her right middle toe and is seeing her primary care physician concerning this issue.  She continues problems with this apparently was placed on antifungal therapy for a nail problem in this area.  She continues to have pain particularly in the morning and I recommended she consider consultation with a podiatrist if this persists.  She continues to have some balance issues related to peripheral neuropathy but this is improved some with physical therapy..    She continues to be frustrated that she is unable to lose weight particularly since she is trying quite a bit.  She has had to cut back on her walking for exercise in light of her right foot pain and I discussed with her that there is more indication for getting her foot addressed particularly if she is unable to lose additional weight.  She has been using coconut oil which is helped with her vaginal and vulvar pruritus  ALLERGIES:  is allergic to penicillins, shellfish allergy, and sulfa antibiotics.  Meds: Current Outpatient Medications  Medication Sig Dispense Refill  . ASHWAGANDHA PO Take by mouth at bedtime. Blue [place] Gummies    . Cholecalciferol (VITAMIN D3) 50 MCG (2000 UT) TABS Take 2,000 Units by mouth daily.    . fluticasone (FLONASE) 50 MCG/ACT nasal spray Place 1 spray into both nostrils daily as needed for allergies or rhinitis.     . Insulin Pen Needle (  BD PEN NEEDLE NANO 2ND GEN) 32G X 4 MM MISC 1 Package by Does not apply route 2 (two) times daily. (Patient taking differently: 1 Package by Does not apply route daily after supper.) 100 each 0  . liraglutide (VICTOZA) 18 MG/3ML SOPN Inject 1.8 mg into the skin daily. 9 mL 0  . mupirocin ointment (BACTROBAN) 2 % 1 application 3 (three) times daily.    . valsartan (DIOVAN) 160 MG tablet Take  160 mg by mouth daily.    Marland Kitchen amLODipine (NORVASC) 10 MG tablet Take 1 tablet (10 mg total) by mouth daily. 90 tablet 0  . terbinafine (LAMISIL) 250 MG tablet Take 250 mg by mouth daily. (Patient not taking: Reported on 06/06/2020)     No current facility-administered medications for this encounter.    Physical Findings: The patient is in no acute distress. Patient is alert and oriented.  height is 5\' 1"  (1.549 m) and weight is 227 lb 3.2 oz (103.1 kg). Her temperature is 98.2 F (36.8 C). Her blood pressure is 142/98 (abnormal) and her pulse is 106 (abnormal). Her respiration is 20 and oxygen saturation is 98%.    Lungs are clear to auscultation bilaterally. Heart has regular rate and rhythm. No palpable cervical, supraclavicular, or axillary adenopathy. Abdomen soft, non-tender, normal bowel sounds. On pelvic examination the external genitalia were unremarkable. A speculum exam was performed.  There are significant radiation changes noted in the cervical os region.  The cervix is flush with the upper vaginal area.  There are no mucosal lesions noted in the vaginal vault. On bimanual and rectovaginal examination there were no pelvic masses appreciated however there is significant induration in the region of the cervix consistent with radiation effect.  Rectal sphincter tone normal.  Lab Findings: Lab Results  Component Value Date   WBC 6.6 08/07/2019   HGB 11.8 (L) 08/07/2019   HCT 34.8 (L) 08/07/2019   MCV 87.7 08/07/2019   PLT 267 08/07/2019    Radiographic Findings: No results found.  Impression: Stage II-B squamous cell carcinoma of the cervix  No evidence of recurrence on clinical exam today.   Plan: The patient will follow up with Dr. Berline Lopes in three months.  She will have a Pap smear at that time.  Follow-up  with radiation oncology in six months.  She will call back if she would like to have a referral to a podiatrist or orthopedic surgeon concerning her right middle toe  pain.  Total time spent in this encounter was 25 minutes which included reviewing the patient's most recent follow-ups, physical therapy, cervical biopsy, pathology report, physical examination, and documentation. ____________________________________   Blair Promise, PhD, MD  This document serves as a record of services personally performed by Gery Pray, MD. It was created on his behalf by Clerance Lav, a trained medical scribe. The creation of this record is based on the scribe's personal observations and the provider's statements to them. This document has been checked and approved by the attending provider.

## 2020-06-06 NOTE — Progress Notes (Addendum)
Patient reports nausea and diarrhea. Reports neuropathy in both feet. Has improved in hands.  Reports using vaginal dilators 3 times weekly. Reports vaginal dryness.

## 2020-06-11 ENCOUNTER — Ambulatory Visit (INDEPENDENT_AMBULATORY_CARE_PROVIDER_SITE_OTHER): Payer: Medicaid Other | Admitting: Adult Health

## 2020-06-11 ENCOUNTER — Encounter (INDEPENDENT_AMBULATORY_CARE_PROVIDER_SITE_OTHER): Payer: Self-pay | Admitting: Adult Health

## 2020-06-11 ENCOUNTER — Other Ambulatory Visit: Payer: Self-pay

## 2020-06-11 VITALS — BP 150/80 | HR 86 | Temp 97.7°F | Ht 61.0 in | Wt 224.0 lb

## 2020-06-11 DIAGNOSIS — B351 Tinea unguium: Secondary | ICD-10-CM

## 2020-06-11 DIAGNOSIS — I10 Essential (primary) hypertension: Secondary | ICD-10-CM | POA: Diagnosis not present

## 2020-06-11 DIAGNOSIS — Z6841 Body Mass Index (BMI) 40.0 and over, adult: Secondary | ICD-10-CM

## 2020-06-11 DIAGNOSIS — E8881 Metabolic syndrome: Secondary | ICD-10-CM | POA: Diagnosis not present

## 2020-06-12 ENCOUNTER — Telehealth: Payer: Self-pay | Admitting: Oncology

## 2020-06-12 DIAGNOSIS — M79674 Pain in right toe(s): Secondary | ICD-10-CM

## 2020-06-12 NOTE — Progress Notes (Signed)
Chief Complaint:   OBESITY Tamara Osborne is here to discuss her progress with her obesity treatment plan along with follow-up of her obesity related diagnoses. Tamara Osborne is on the Category 2 Plan and states she is following her eating plan approximately 75% of the time. Tamara Osborne states she is not currently exercising.  Today's visit was #: 57 Starting weight: 208 lbs Starting date: 09/20/2019 Today's weight: 224 lbs Today's date: 06/11/2020 Total lbs lost to date: 0 Total lbs lost since last in-office visit: 0  Interim History: Tamara Osborne has had two separate episodes of GI upset, including nausea without vomiting and diarrhea, with the most recent occurrence 06/04/2020. She denies any current symptoms at present. She has been unable to exercise due to continued right toe/foot pain.    Subjective:   1. Insulin resistance Tamara Osborne is on Victoza 1.8 mg QD and tolerating it well. She denies mass in neck, dysphagia, dyspepsia, or persistent hoarseness.  Lab Results  Component Value Date   INSULIN 40.3 (H) 09/20/2019   Lab Results  Component Value Date   HGBA1C 6.0 (H) 05/20/2020    2. Essential hypertension Tamara Osborne's SBP is elevated at OV. She denies cardiac symptoms. She is on amlodipine 10 mg QD and valsartan 160 mg QD.  BP Readings from Last 3 Encounters:  06/11/20 (!) 150/80  06/06/20 (!) 142/98  05/02/20 114/79    3. Onychomycosis of toenail Tamara Osborne's PCP started her on Lamisil 250 mg QD. She has been on this therapy for approximately 2 months; min therapy duration is 12 weeks. Per pt, oncology/Dr. Sondra Come referred her to podiatry regarding continued right foot pain.  Assessment/Plan:   1. Insulin resistance Tamara Osborne will continue to work on weight loss, exercise, and decreasing simple carbohydrates to help decrease the risk of diabetes. Jakia agreed to follow-up with [place] as directed to closely monitor her progress. Continue Victoza 1.8 mg QD. No need for refill  today.  2. Essential hypertension Tamara Osborne is working on healthy weight loss and exercise to improve blood pressure control. We will watch for signs of hypotension as she continues her lifestyle modifications. Continue current anti-hypertensive regimen as directed.  3. Onychomycosis of toenail Avoid ETOH while on Lamisil.  4. Class 3 severe obesity with serious comorbidity and body mass index (BMI) of 40.0 to 44.9 in adult, unspecified obesity type (HCC) Tamara Osborne is currently in the action stage of change. As such, her goal is to continue with weight loss efforts. She has agreed to the Category 2 Plan.   Handout: Exercise Sheet with Band Follow up with podiatry referral.  Exercise goals: No exercise has been prescribed at this time.  Behavioral modification strategies: increasing lean protein intake, meal planning and cooking strategies and planning for success.  Tamara Osborne has agreed to follow-up with our clinic in 3 weeks. She was informed of the importance of frequent follow-up visits to maximize her success with intensive lifestyle modifications for her multiple health conditions.   Objective:   Blood pressure (!) 150/80, pulse 86, temperature 97.7 F (36.5 C), height 5\' 1"  (1.549 m), weight 224 lb (101.6 kg), SpO2 97 %. Body mass index is 42.32 kg/m.  General: Cooperative, alert, well developed, in no acute distress. HEENT: Conjunctivae and lids unremarkable. Cardiovascular: Regular rhythm.  Lungs: Normal work of breathing. Neurologic: No focal deficits.   Lab Results  Component Value Date   CREATININE 1.33 (H) 05/20/2020   BUN 21 (H) 05/20/2020   NA 134 (L) 05/20/2020   K 4.1 05/20/2020  CL 106 05/20/2020   CO2 24 05/20/2020   Lab Results  Component Value Date   ALT 38 05/20/2020   AST 20 05/20/2020   ALKPHOS 95 05/20/2020   BILITOT 0.4 05/20/2020   Lab Results  Component Value Date   HGBA1C 6.0 (H) 05/20/2020   HGBA1C 5.2 10/30/2019   HGBA1C CANCELED  09/20/2019   Lab Results  Component Value Date   INSULIN 40.3 (H) 09/20/2019   Lab Results  Component Value Date   TSH 1.910 09/20/2019   Lab Results  Component Value Date   CHOL 214 (H) 05/20/2020   HDL 32 (L) 05/20/2020   LDLCALC 145 (H) 05/20/2020   TRIG 184 (H) 05/20/2020   CHOLHDL 6.7 05/20/2020   Lab Results  Component Value Date   WBC 6.6 08/07/2019   HGB 11.8 (L) 08/07/2019   HCT 34.8 (L) 08/07/2019   MCV 87.7 08/07/2019   PLT 267 08/07/2019   Lab Results  Component Value Date   IRON 17 (L) 03/27/2019   TIBC 283 03/27/2019   FERRITIN 32 03/27/2019    Attestation Statements:   Reviewed by clinician on day of visit: allergies, medications, problem list, medical history, surgical history, family history, social history, and previous encounter notes.  Time spent on visit including pre-visit chart review and post-visit care and charting was 32 minutes.   Coral Ceo, am acting as Location manager for Mina Marble, NP.  I have reviewed the above documentation for accuracy and completeness, and I agree with the above. -  Cardarius Senat d. Janene Yousuf, NP-C

## 2020-06-12 NOTE — Telephone Encounter (Signed)
Tamara Osborne called and wanted to reschedule her appointment with Dr. Berline Lopes to July.  Advised her that Dr. Charisse March schedule for July is not out yet and the we will call her with a new appointment.  She also asked about a podiatry referral because she is having right toe pain and having trouble walking.  Advised her that I will check with Dr. Sondra Come about the referral.

## 2020-06-19 ENCOUNTER — Ambulatory Visit (INDEPENDENT_AMBULATORY_CARE_PROVIDER_SITE_OTHER): Payer: Medicaid Other

## 2020-06-19 ENCOUNTER — Encounter: Payer: Self-pay | Admitting: Podiatry

## 2020-06-19 ENCOUNTER — Ambulatory Visit: Payer: Medicaid Other | Admitting: Podiatry

## 2020-06-19 ENCOUNTER — Other Ambulatory Visit: Payer: Self-pay

## 2020-06-19 DIAGNOSIS — B351 Tinea unguium: Secondary | ICD-10-CM | POA: Diagnosis not present

## 2020-06-19 DIAGNOSIS — M79671 Pain in right foot: Secondary | ICD-10-CM | POA: Diagnosis not present

## 2020-06-19 DIAGNOSIS — M7751 Other enthesopathy of right foot: Secondary | ICD-10-CM

## 2020-06-19 MED ORDER — TRIAMCINOLONE ACETONIDE 10 MG/ML IJ SUSP
10.0000 mg | Freq: Once | INTRAMUSCULAR | Status: AC
  Administered 2020-06-19: 10 mg

## 2020-06-19 NOTE — Progress Notes (Signed)
dg 

## 2020-06-19 NOTE — Progress Notes (Signed)
Subjective:   Patient ID: Tamara Osborne, female   DOB: 45 y.o.   MRN: [id number]   HPI Patient presents stating that she has a lot of pains around her toes and she also has nail disease and states she is having trouble walking and she is trying to lose weight and needs to be active and has been treated for cancer to a significant degree   Review of Systems  All other systems reviewed and are negative.       Objective:  Physical Exam Vitals and nursing note reviewed.  Constitutional:      Appearance: She is well-developed.  Pulmonary:     Effort: Pulmonary effort is normal.  Musculoskeletal:        General: Normal range of motion.  Skin:    General: Skin is warm.  Neurological:     Mental Status: She is alert.     Neurovascular status intact muscle strength adequate range of motion adequate patient found to have exquisite discomfort second MPJ right inflammation fluid around the joint surface and is noted to have thickened nailbeds with the third right being dystrophic and abnormal     Assessment:  Inflammatory capsulitis second MPJ right along with nail disease mycotic component bilateral     Plan:  H&P reviewed all conditions we will get a focus on the inflamed joint.  I did sterile prep and injected the joint surface 3 mg Dexasone Kenalog after aspirating the joint first and doing forefoot block with sterile dressing.  I advised on rigid bottom shoes reappoint as needed  X-rays were negative for signs that there is fracture or arthritis associated with this.  Appears to be inflammatory

## 2020-06-23 ENCOUNTER — Encounter: Payer: Self-pay | Admitting: Neurology

## 2020-06-25 ENCOUNTER — Ambulatory Visit: Payer: Medicaid Other | Admitting: Neurology

## 2020-06-25 ENCOUNTER — Encounter: Payer: Self-pay | Admitting: Neurology

## 2020-06-25 VITALS — BP 155/97 | HR 87 | Ht 62.0 in | Wt 231.0 lb

## 2020-06-25 DIAGNOSIS — Z9989 Dependence on other enabling machines and devices: Secondary | ICD-10-CM | POA: Diagnosis not present

## 2020-06-25 DIAGNOSIS — G4733 Obstructive sleep apnea (adult) (pediatric): Secondary | ICD-10-CM | POA: Diagnosis not present

## 2020-06-25 NOTE — Progress Notes (Signed)
Subjective:    Patient ID: Tamara Osborne is a 46 y.o. female.  HPI     Interim history:  Tamara Osborne is a 45 year old right-handed woman with an underlying medical history of palpitations, hypertension, neuropathy, diabetes, history of cervical cancer, anxiety, anemia, and obesity, who presents for follow-up consultation of her obstructive sleep apnea after interim home sleep testing and starting AutoPap therapy.  The patient is unaccompanied today.  I first met her at the request of Dr. Adair Patter on 11/21/2019, at which time she reported a prior diagnosis of sleep apnea but she has never been on CPAP therapy before as insurance did not cover her machine.  She had a home sleep test on 12/10/2020 which indicated severe sleep apnea with an AHI of 37.8/h, O2 nadir 86%.  She was advised to start AutoPap therapy, her set up date was 12/22/2020.   Today, 06/25/2020: I reviewed her AutoPap compliance data from 05/24/2020 through 06/23/2020, which is a total of 31 minutes, during which time she used her machine every night with percent use days greater than 4 hours at 90.3%, indicating excellent compliance, average usage of 6 hours and 14 minutes, residual AHI at goal at 0.2/h, leak and no, 95th percentile of pressure at 8 cm.  She reports having adjusted well to treatment.  She uses a DreamWear fullface mask, size small.  She likes this mask.  She has benefited from treatment quite significantly.  She reports better quality sleep, more sleep consolidation, less daytime somnolence to the point where she does not need to take any naps.  Previously, she would take longer naps.  She also has noticed improvement in her nocturia.  She still has issues with urinary incontinence since her cervical cancer surgeries and she has benefited from physical therapy for this.  She does have to wear protection at night.  She has also noticed improvement in her blood pressure.  In fact, she has not needed to use her clonidine since  starting her AutoPap.  She is working on weight loss, she has been on Victoza.  She is highly motivated to continue with her AutoPap.  The patient's allergies, current medications, family history, past medical history, past social history, past surgical history and problem list were reviewed and updated as appropriate.   Previously:   11/21/19: (She) was diagnosed with obstructive sleep apnea several years ago.  She reports significant daytime somnolence.  Prior sleep study results are not available for my review today.  She reports that sleep study testing was done in [place] about 7 or 8 years ago.  She qualified for CPAP therapy but her insurance would not cover it at the time.  She has had weight fluctuations.  She was diagnosed with cervical cancer earlier in the year and underwent surgery, radiation and chemotherapy.  She had lost weight, down to 180 pounds but gained a lot of weight back.  She is in the process of trying to lose weight.  She recently started Victoza. I reviewed your office note from 10/19/2019.  Her Epworth sleepiness score is 14 out of 24 today, fatigue severity score is 32 out of 63.  His snoring is loud and disturbs her boyfriend.  She lives with her boyfriend, they have 1 dog in the household.  She has 3 grown children, ages 67, 43 and 34.  She is currently not working.  She is hoping to go back to work after October.  She has an appointment with her oncologist in early October.  Bedtime is around 9 or 9:30 PM and rise time generally around 8 or 9.  She does wake up around 5 or 5:30 AM to let the dog out but goes back to bed.  She has nocturia about once per average night, she denies recurrent morning headaches.  She has 1 niece with sleep apnea.  She is trying to reduce her caffeine intake.  She drinks approximately 1 serving per day currently.  She does not drink alcohol, she is a non-smoker.  She would be willing to get rechecked for sleep apnea and consider CPAP therapy.   Her  Past Medical History Is Significant For: Past Medical History:  Diagnosis Date  . Anemia   . Anxiety   . Bartholin cyst   . Cervical cancer ([place])   . Constipation   . Depression   . Deviated septum   . Diabetes mellitus without complication ([place])   . Difficult intravenous access    used port for 05-09-2019 surgery  . Fibroids   . History of blood transfusion    2 units given 04-26-2019, has had total of 8 units since jan 2021  . History of blood transfusion 1 unit given 05-30-19   iv fluids also given  . History of kidney problems   . History of radiation therapy 03/23/2019-05/04/2019   Cervical IMRT   Dr Gery Pray  . History of radiation therapy 05/09/2019-06/05/2019   vaginal brachytherapy   Dr Gery Pray  . History of recent blood transfusion 05/16/2019   2 units given  per dr Pollyann Savoy at cancer center  . Hypertension   . Neuropathy    both thumbs  . Obesity   . Palpitations   . PONV (postoperative nausea and vomiting)   . Prediabetes   . Preeclampsia 2006  . Sleep apnea    no cpap used insurance would not cover, osa severe per pt  . SOB (shortness of breath)   . Swallowing difficulty     Her Past Surgical History Is Significant For: Past Surgical History:  Procedure Laterality Date  . CESAREAN SECTION WITH BILATERAL TUBAL LIGATION  11/10/2004      . DILATION AND CURETTAGE OF UTERUS  1997  . IR IMAGING GUIDED PORT INSERTION  04/04/2019  . OPERATIVE ULTRASOUND N/A 05/09/2019   Procedure: OPERATIVE ULTRASOUND;  Surgeon: Gery Pray, MD;  Location: [facility];  Service: Urology;  Laterality: N/A;  . OPERATIVE ULTRASOUND N/A 05/15/2019   Procedure: OPERATIVE ULTRASOUND;  Surgeon: Gery Pray, MD;  Location: [facility];  Service: Urology;  Laterality: N/A;  . OPERATIVE ULTRASOUND N/A 05/22/2019   Procedure: OPERATIVE ULTRASOUND;  Surgeon: Gery Pray, MD;  Location: [facility];  Service: Urology;  Laterality: N/A;  . OPERATIVE  ULTRASOUND N/A 05/29/2019   Procedure: OPERATIVE ULTRASOUND;  Surgeon: Gery Pray, MD;  Location: [facility];  Service: Urology;  Laterality: N/A;  . OPERATIVE ULTRASOUND N/A 06/05/2019   Procedure: OPERATIVE ULTRASOUND;  Surgeon: Gery Pray, MD;  Location: [facility];  Service: Urology;  Laterality: N/A;  . TANDEM RING INSERTION N/A 05/09/2019   Procedure: TANDEM RING INSERTION;  Surgeon: Gery Pray, MD;  Location: [facility];  Service: Urology;  Laterality: N/A;  . TANDEM RING INSERTION N/A 05/15/2019   Procedure: TANDEM RING INSERTION;  Surgeon: Gery Pray, MD;  Location: [facility];  Service: Urology;  Laterality: N/A;  . TANDEM RING INSERTION N/A 05/22/2019   Procedure: TANDEM RING INSERTION;  Surgeon: Gery Pray, MD;  Location: [facility];  Service: Urology;  Laterality: N/A;  . TANDEM RING INSERTION N/A 05/29/2019   Procedure: TANDEM RING INSERTION;  Surgeon: Gery Pray, MD;  Location: [facility];  Service: Urology;  Laterality: N/A;  . TANDEM RING INSERTION N/A 06/05/2019   Procedure: TANDEM RING INSERTION;  Surgeon: Gery Pray, MD;  Location: [facility];  Service: Urology;  Laterality: N/A;    Her Family History Is Significant For: Family History  Problem Relation Age of Onset  . Brain cancer Mother        lung cancer to brain  . Hypertension Mother   . Cancer Mother   . Depression Mother   . Anxiety disorder Mother   . Diabetes Father   . Kidney disease Father   . Cancer Father        kidney cancer  . Heart failure Sister     Her Social History Is Significant For: Social History   Socioeconomic History  . Marital status: Single    Spouse name: Not on file  . Number of children: 4  . Years of education: Not on file  . Highest education level: Not on file  Occupational History  . Not on file  Tobacco Use  . Smoking status: Never Smoker  .  Smokeless tobacco: Never Used  Vaping Use  . Vaping Use: Never used  Substance and Sexual Activity  . Alcohol use: No  . Drug use: No  . Sexual activity: Not Currently    Birth control/protection: Surgical  Other Topics Concern  . Not on file  Social History Narrative   Lives with her boyfriend   Social Determinants of Health   Financial Resource Strain: Not on file  Food Insecurity: Not on file  Transportation Needs: Not on file  Physical Activity: Not on file  Stress: Not on file  Social Connections: Not on file    Her Allergies Are:  Allergies  Allergen Reactions  . Penicillins     Not sure a child allergy  . Shellfish Allergy Swelling    Seafood also any kind  . Sulfa Antibiotics     Not sure a child allergy  :   Her Current Medications Are:  Outpatient Encounter Medications as of 06/25/2020  Medication Sig  . ASHWAGANDHA PO Take by mouth at bedtime. Blue [place] Gummies  . Cholecalciferol (VITAMIN D3) 50 MCG (2000 UT) TABS Take 2,000 Units by mouth daily.  Marland Kitchen CLONIDINE HCL PO Take by mouth.  . fluticasone (FLONASE) 50 MCG/ACT nasal spray Place 1 spray into both nostrils daily as needed for allergies or rhinitis.   . Insulin Pen Needle (BD PEN NEEDLE NANO 2ND GEN) 32G X 4 MM MISC 1 Package by Does not apply route 2 (two) times daily. (Patient taking differently: 1 Package by Does not apply route daily after supper.)  . liraglutide (VICTOZA) 18 MG/3ML SOPN Inject 1.8 mg into the skin daily.  . mupirocin ointment (BACTROBAN) 2 % 1 application 3 (three) times daily.  Marland Kitchen terbinafine (LAMISIL) 250 MG tablet Take 250 mg by mouth daily.  . valsartan (DIOVAN) 160 MG tablet Take 160 mg by mouth daily.  Marland Kitchen amLODipine (NORVASC) 10 MG tablet Take 1 tablet (10 mg total) by mouth daily.   No facility-administered encounter medications on file as of 06/25/2020.  :  Review of Systems:  Out of a complete 14 point review of systems, all are reviewed and negative with the exception  of  these symptoms as listed below: Review of Systems  Neurological:       Here for f/u on auto pap. Pt reports she is doing well. No complaints.     Objective:  Neurological Exam  Physical Exam Physical Examination:   Vitals:   06/25/20 0939  BP: (!) 155/97  Pulse: 87    General Examination: The patient is a very pleasant 45 y.o. female in no acute distress. She appears well-developed and well-nourished and well groomed.   HEENT: Normocephalic, atraumatic, pupils are equal, round and reactive to light, extraocular tracking is good without limitation to gaze excursion or nystagmus noted. Hearing is grossly intact. Face is symmetric with normal facial animation. Speech is clear with no dysarthria noted. There is no hypophonia. There is no lip, neck/head, jaw or voice tremor. Neck is supple with full range of passive and active motion. There are no carotid bruits on auscultation. Oropharynx exam reveals: mild mouth dryness, adequate dental hygiene and significant airway crowding.  Tongue protrudes centrally and palate elevates symmetrically.    Chest: Clear to auscultation without wheezing, rhonchi or crackles noted.  Benign appearing port scar in the right upper chest.  Heart: S1+S2+0, regular and normal without murmurs, rubs or gallops noted.   Abdomen: Soft, non-tender and non-distended with normal bowel sounds appreciated on auscultation.  Extremities: There is no pitting edema in the distal lower extremities bilaterally.   Skin: Warm and dry without trophic changes noted.   Musculoskeletal: exam reveals no obvious joint deformities, tenderness or joint swelling or erythema.   Neurologically:  Mental status: The patient is awake, alert and oriented in all 4 spheres. Her immediate and remote memory, attention, language skills and fund of knowledge are appropriate. There is no evidence of aphasia, agnosia, apraxia or anomia. Speech is clear with normal prosody and enunciation.  Thought process is linear. Mood is normal and affect is normal.  Cranial nerves II - XII are as described above under HEENT exam.  Motor exam: Normal bulk, strength and tone is noted. There is no tremor. Fine motor skills and coordination: grossly intact.  Cerebellar testing: No dysmetria or intention tremor. There is no truncal or gait ataxia.  Sensory exam: intact to light touch in the upper and lower extremities.  Gait, station and balance: She stands easily. No veering to one side is noted. No leaning to one side is noted. Posture is age-appropriate and stance is narrow based. Gait shows normal stride length and normal pace. No problems turning are noted.  Assessment and Plan:  In summary, Tamara Osborne is a very pleasant 45 year old female with an underlying medical history of palpitations, hypertension, neuropathy, diabetes, history of cervical cancer, anxiety, anemia, and obesity, who presents for follow-up consultation of her obstructive sleep apnea which was deemed in the severe range by home sleep testing on 12/11/2019.  She had a prior diagnosis of obstructive sleep apnea several years ago but had not been on CPAP therapy before.  She has established treatment with AutoPap therapy since February 2022 and is fully compliant with treatment.  She has benefited from treatment including improvement in her daytime somnolence, better sleep consolidation, improvement in her nocturia.  In addition, she has noticed improvement in her blood pressure fluctuation and has not used her clonidine since starting AutoPap therapy.  Previously, she has used clonidine as needed up to once or twice a week even. She is highly commended for her treatment adherence and is encouraged to continue with  full compliance.  She is working on weight loss.  She is advised to follow-up routinely in this clinic in 1 year for a sleep apnea recheck.  She can see one of our nurse practitioners.  We talked about her home sleep test  results and reviewed her compliance data in detail.  I answered all her questions today and she was in agreement with the plan.   I spent 32 minutes in total face-to-face time and in reviewing records during pre-charting, more than 50% of which was spent in counseling and coordination of care, reviewing test results, reviewing medications and treatment regimen and/or in discussing or reviewing the diagnosis of OSA, the prognosis and treatment options. Pertinent laboratory and imaging test results that were available during this visit with the patient were reviewed by me and considered in my medical decision making (see chart for details).

## 2020-06-25 NOTE — Patient Instructions (Signed)
It was nice to see you again today.  I am glad you have adjusted well to AutoPap therapy and are benefiting from treatment.  You are fully compliant with it and your apnea scores look great.  Mask seal is also very good.  Keep up the good work.  Continue to work on weight loss.  Please continue using your autoPAP regularly. While your insurance requires that you use PAP at least 4 hours each night on 70% of the nights, I recommend, that you not skip any nights and use it throughout the night if you can. Getting used to PAP and staying with the treatment long term does take time and patience and discipline. Untreated obstructive sleep apnea when it is moderate to severe can have an adverse impact on cardiovascular health and raise her risk for heart disease, arrhythmias, hypertension, congestive heart failure, stroke and diabetes. Untreated obstructive sleep apnea causes sleep disruption, nonrestorative sleep, and sleep deprivation. This can have an impact on your day to day functioning and cause daytime sleepiness and impairment of cognitive function, memory loss, mood disturbance, and problems focussing. Using PAP regularly can improve these symptoms.   We can see you in 1 year, you can see one of our nurse practitioners as you are stable.

## 2020-06-28 ENCOUNTER — Ambulatory Visit: Payer: Medicaid Other | Admitting: Gynecologic Oncology

## 2020-06-28 ENCOUNTER — Telehealth: Payer: Self-pay

## 2020-06-28 ENCOUNTER — Other Ambulatory Visit: Payer: Self-pay

## 2020-06-28 ENCOUNTER — Inpatient Hospital Stay: Payer: Medicaid Other | Attending: Hematology and Oncology

## 2020-06-28 ENCOUNTER — Telehealth: Payer: Self-pay | Admitting: Hematology and Oncology

## 2020-06-28 DIAGNOSIS — Z452 Encounter for adjustment and management of vascular access device: Secondary | ICD-10-CM | POA: Diagnosis present

## 2020-06-28 DIAGNOSIS — Z95828 Presence of other vascular implants and grafts: Secondary | ICD-10-CM

## 2020-06-28 DIAGNOSIS — C539 Malignant neoplasm of cervix uteri, unspecified: Secondary | ICD-10-CM | POA: Insufficient documentation

## 2020-06-28 MED ORDER — HEPARIN SOD (PORK) LOCK FLUSH 100 UNIT/ML IV SOLN
500.0000 [IU] | Freq: Once | INTRAVENOUS | Status: AC
  Administered 2020-06-28: 500 [IU]
  Filled 2020-06-28: qty 5

## 2020-06-28 MED ORDER — SODIUM CHLORIDE 0.9% FLUSH
10.0000 mL | Freq: Once | INTRAVENOUS | Status: AC
  Administered 2020-06-28: 10 mL
  Filled 2020-06-28: qty 10

## 2020-06-28 NOTE — Telephone Encounter (Signed)
Scheduled appts per 4/29 sch msg. Pt aware.  

## 2020-06-28 NOTE — Patient Instructions (Signed)
Implanted Port Insertion, Care After This sheet gives you information about how to care for yourself after your procedure. Your health care provider may also give you more specific instructions. If you have problems or questions, contact your health care provider. What can I expect after the procedure? After the procedure, it is common to have:  Discomfort at the port insertion site.  Bruising on the skin over the port. This should improve over 3-4 days. Follow these instructions at home: Port care  After your port is placed, you will get a manufacturer's information card. The card has information about your port. Keep this card with you at all times.  Take care of the port as told by your health care provider. Ask your health care provider if you or a family member can get training for taking care of the port at home. A home health care nurse may also take care of the port.  Make sure to remember what type of port you have. Incision care  Follow instructions from your health care provider about how to take care of your port insertion site. Make sure you: ? Wash your hands with soap and water before and after you change your bandage (dressing). If soap and water are not available, use hand sanitizer. ? Change your dressing as told by your health care provider. ? Leave stitches (sutures), skin glue, or adhesive strips in place. These skin closures may need to stay in place for 2 weeks or longer. If adhesive strip edges start to loosen and curl up, you may trim the loose edges. Do not remove adhesive strips completely unless your health care provider tells you to do that.  Check your port insertion site every day for signs of infection. Check for: ? Redness, swelling, or pain. ? Fluid or blood. ? Warmth. ? Pus or a bad smell.      Activity  Return to your normal activities as told by your health care provider. Ask your health care provider what activities are safe for you.  Do not  lift anything that is heavier than 10 lb (4.5 kg), or the limit that you are told, until your health care provider says that it is safe. General instructions  Take over-the-counter and prescription medicines only as told by your health care provider.  Do not take baths, swim, or use a hot tub until your health care provider approves. Ask your health care provider if you may take showers. You may only be allowed to take sponge baths.  Do not drive for 24 hours if you were given a sedative during your procedure.  Wear a medical alert bracelet in case of an emergency. This will tell any health care providers that you have a port.  Keep all follow-up visits as told by your health care provider. This is important. Contact a health care provider if:  You cannot flush your port with saline as directed, or you cannot draw blood from the port.  You have a fever or chills.  You have redness, swelling, or pain around your port insertion site.  You have fluid or blood coming from your port insertion site.  Your port insertion site feels warm to the touch.  You have pus or a bad smell coming from the port insertion site. Get help right away if:  You have chest pain or shortness of breath.  You have bleeding from your port that you cannot control. Summary  Take care of the port as told by your   health care provider. Keep the manufacturer's information card with you at all times.  Change your dressing as told by your health care provider.  Contact a health care provider if you have a fever or chills or if you have redness, swelling, or pain around your port insertion site.  Keep all follow-up visits as told by your health care provider. This information is not intended to replace advice given to you by your health care provider. Make sure you discuss any questions you have with your health care provider. Document Revised: 09/14/2017 Document Reviewed: 09/14/2017 Elsevier Patient Education   2021 Elsevier Inc.  

## 2020-06-28 NOTE — Telephone Encounter (Signed)
MD recommendations to follow up with patient regarding port removal.  MD recommends to proceed with port removal.  RN spoke with patient, patient would strongly like to keep port placed until additional follow up with Dr. Berline Lopes this summer.    MD notified, patient will be set up for additional flush appointments.

## 2020-06-28 NOTE — Progress Notes (Signed)
Unable to get blood return from pt's port this visit. Pt informed that she may need cathflo on next visit if there is still no blood return.

## 2020-07-04 ENCOUNTER — Encounter (INDEPENDENT_AMBULATORY_CARE_PROVIDER_SITE_OTHER): Payer: Self-pay | Admitting: Adult Health

## 2020-07-04 ENCOUNTER — Other Ambulatory Visit: Payer: Self-pay

## 2020-07-04 ENCOUNTER — Ambulatory Visit (INDEPENDENT_AMBULATORY_CARE_PROVIDER_SITE_OTHER): Payer: Medicaid Other | Admitting: Adult Health

## 2020-07-04 VITALS — BP 145/84 | HR 100 | Temp 98.1°F | Ht 62.0 in | Wt 227.0 lb

## 2020-07-04 DIAGNOSIS — Z6841 Body Mass Index (BMI) 40.0 and over, adult: Secondary | ICD-10-CM

## 2020-07-04 DIAGNOSIS — I10 Essential (primary) hypertension: Secondary | ICD-10-CM

## 2020-07-04 DIAGNOSIS — E559 Vitamin D deficiency, unspecified: Secondary | ICD-10-CM | POA: Diagnosis not present

## 2020-07-04 DIAGNOSIS — E8881 Metabolic syndrome: Secondary | ICD-10-CM

## 2020-07-08 NOTE — Progress Notes (Signed)
Chief Complaint:   OBESITY Tamara Osborne is here to discuss her progress with her obesity treatment plan along with follow-up of her obesity related diagnoses. Tamara Osborne is on the Category 2 Plan and states she is following her eating plan approximately 85% of the time. Tamara Osborne states she is not currently exercising.  Today's visit was #: 18 Starting weight: 208 lbs Starting date: 09/20/2019 Today's weight: 227 lbs Today's date: 07/04/2020 Total lbs lost to date: 0 Total lbs lost since last in-office visit: 0  Interim History: Tamara Osborne was seen by podiatry 06/19/2020- inflammatory capsulitis of R 2nd MPJ along with nail disease. She received injection with 3 mg Dexasone Kenalog into R foot. She was recommended to wear "rigid bottom shoes". She reports resolution of sharp pain, however, is experiencing "dull ache" rated 3/10- pain with use only. Activity has been difficult with foot pain.   Subjective:   1. Vitamin D deficiency Tamara Osborne's Vitamin D level was 45.60 on 05/20/2020. She is currently taking OTC vitamin D 2,000 IU each day. She denies nausea, vomiting or muscle weakness.  2. Insulin resistance Tamara Osborne is on Victoza 1.8 mg QD. Pt denies mass in neck, dysphagia, dyspepsia, or persistent hoarseness. She will experience occasional constipation.  3. Essential hypertension BP improving with consistent CPAP usage. Tamara Osborne reports decline in overall fatigue levels. She is not napping anymore. She is on valsartan 160 mg QD and amlodipine 10 mg QD. She has not needed PRN clonidine in over 8 weeks.  BP Readings from Last 3 Encounters:  07/04/20 (!) 145/84  06/25/20 (!) 155/97  06/11/20 (!) 150/80    Assessment/Plan:   1. Vitamin D deficiency Low Vitamin D level contributes to fatigue and are associated with obesity, breast, and colon cancer. She agrees to continue to take OTC Vitamin D @2 ,000 IU daily and will follow-up for routine testing of Vitamin D, at least 2-3 times per year  to avoid over-replacement.  2. Insulin resistance Tamara Osborne will continue to work on weight loss, exercise, and decreasing simple carbohydrates to help decrease the risk of diabetes. Tamara Osborne agreed to follow-up with [place] as directed to closely monitor her progress. Increase water intake and continue Victoza as directed.  3. Essential hypertension Tamara Osborne is working on healthy weight loss and exercise to improve blood pressure control. We will watch for signs of hypotension as she continues her lifestyle modifications. Continue ARB and ACE daily. Continue nightly CPAP use.  4. Class 3 severe obesity with serious comorbidity and body mass index (BMI) of 40.0 to 44.9 in adult, unspecified obesity type (HCC)  Tamara Osborne is currently in the action stage of change. As such, her goal is to continue with weight loss efforts. She has agreed to the Category 2 Plan.   1. Protein goals for each meal: Breakfast- 20 g, Lunch- 30 g, Dinner 35 g. 2. Biotene Mouth wash for dry mouth.  Exercise goals: No exercise has been prescribed at this time.  Behavioral modification strategies: increasing lean protein intake, decreasing simple carbohydrates, meal planning and cooking strategies, better snacking choices and planning for success.  Tamara Osborne has agreed to follow-up with our clinic in 3-4 weeks. She was informed of the importance of frequent follow-up visits to maximize her success with intensive lifestyle modifications for her multiple health conditions.   Objective:   Blood pressure (!) 145/84, pulse 100, temperature 98.1 F (36.7 C), height 5\' 2"  (1.575 m), weight 227 lb (103 kg), SpO2 93 %. Body mass index is 41.52 kg/m.  General:  Cooperative, alert, well developed, in no acute distress. HEENT: Conjunctivae and lids unremarkable. Cardiovascular: Regular rhythm.  Lungs: Normal work of breathing. Neurologic: No focal deficits.   Lab Results  Component Value Date   CREATININE 1.33 (H) 05/20/2020    BUN 21 (H) 05/20/2020   NA 134 (L) 05/20/2020   K 4.1 05/20/2020   CL 106 05/20/2020   CO2 24 05/20/2020   Lab Results  Component Value Date   ALT 38 05/20/2020   AST 20 05/20/2020   ALKPHOS 95 05/20/2020   BILITOT 0.4 05/20/2020   Lab Results  Component Value Date   HGBA1C 6.0 (H) 05/20/2020   HGBA1C 5.2 10/30/2019   HGBA1C CANCELED 09/20/2019   Lab Results  Component Value Date   INSULIN 40.3 (H) 09/20/2019   Lab Results  Component Value Date   TSH 1.910 09/20/2019   Lab Results  Component Value Date   CHOL 214 (H) 05/20/2020   HDL 32 (L) 05/20/2020   LDLCALC 145 (H) 05/20/2020   TRIG 184 (H) 05/20/2020   CHOLHDL 6.7 05/20/2020   Lab Results  Component Value Date   WBC 6.6 08/07/2019   HGB 11.8 (L) 08/07/2019   HCT 34.8 (L) 08/07/2019   MCV 87.7 08/07/2019   PLT 267 08/07/2019   Lab Results  Component Value Date   IRON 17 (L) 03/27/2019   TIBC 283 03/27/2019   FERRITIN 32 03/27/2019     Attestation Statements:   Reviewed by clinician on day of visit: allergies, medications, problem list, medical history, surgical history, family history, social history, and previous encounter notes.  Time spent on visit including pre-visit chart review and post-visit care and charting was 30 minutes.   Coral Ceo, CMA, am acting as transcriptionist for Mina Marble, NP.  I have reviewed the above documentation for accuracy and completeness, and I agree with the above. -  Shenica Holzheimer d. Hilmar Moldovan, NP-C

## 2020-07-22 ENCOUNTER — Telehealth: Payer: Self-pay | Admitting: Oncology

## 2020-07-22 NOTE — Telephone Encounter (Signed)
[facility] called to schedule an apt with Dr. Berline Lopes in July.  Apt scheduled for 09/06/20 at 1:00.

## 2020-07-23 IMAGING — US IR IMAGING GUIDED PORT INSERTION
2 series · 3 of 3 positions shown · non-contrast
Comparison: PET-CT-03/28/2019

INDICATION: History of cervical cancer. In need of durable intravenous access
for chemotherapy administration.

EXAM:
IMPLANTED PORT A CATH PLACEMENT WITH ULTRASOUND AND FLUOROSCOPIC
GUIDANCE

[Series 1: (id) · 2 of 2 slices shown]
[im 1/2]
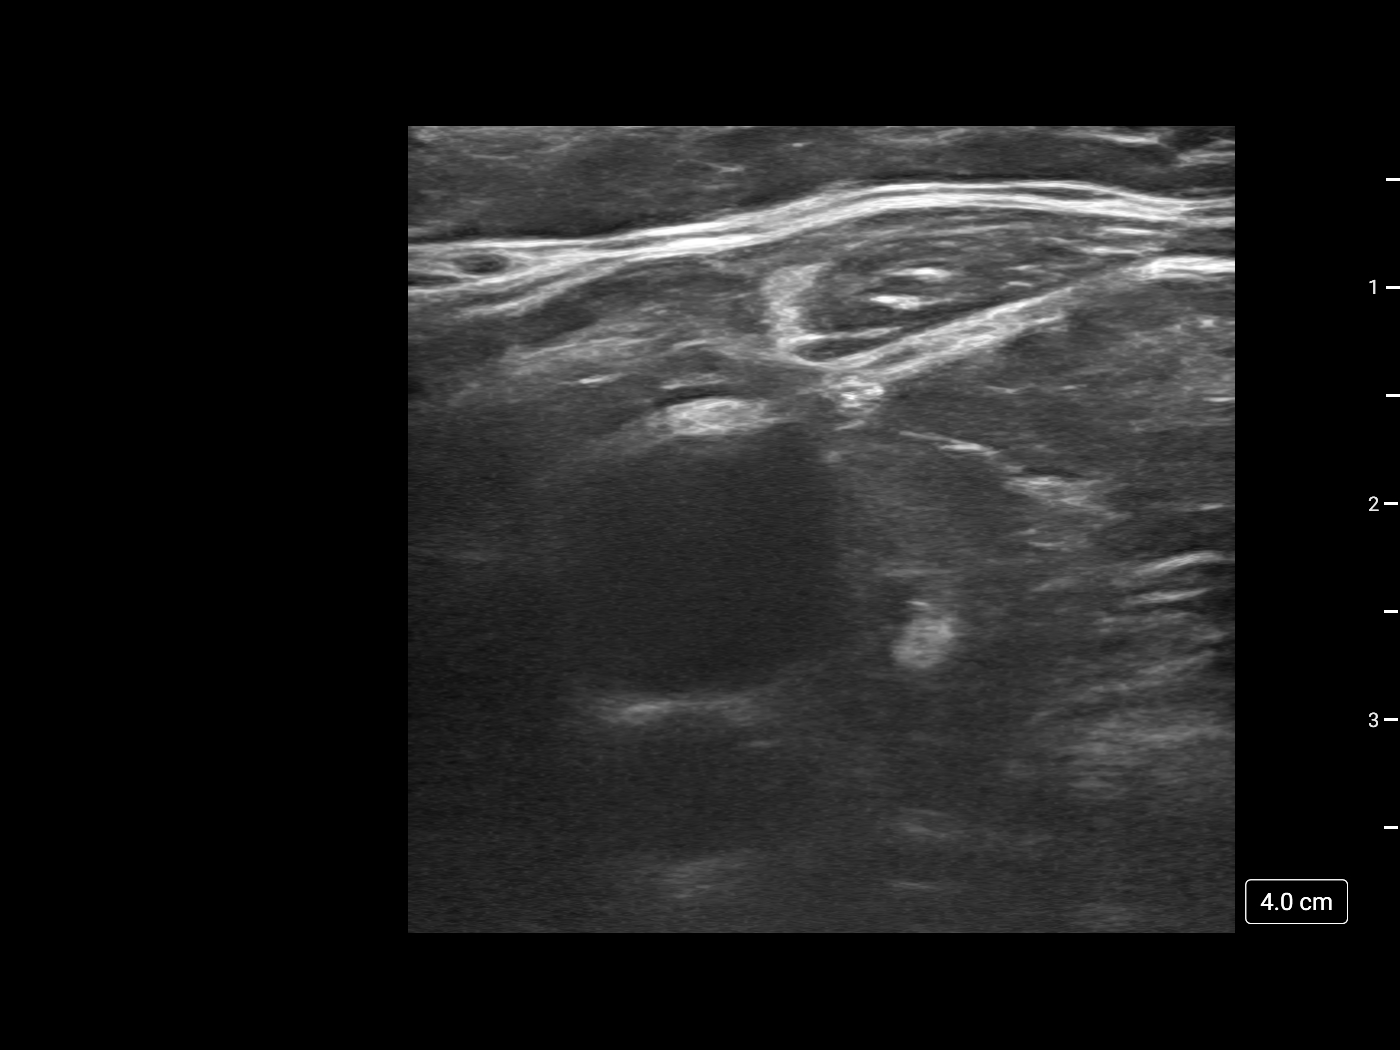
[im 2/2]
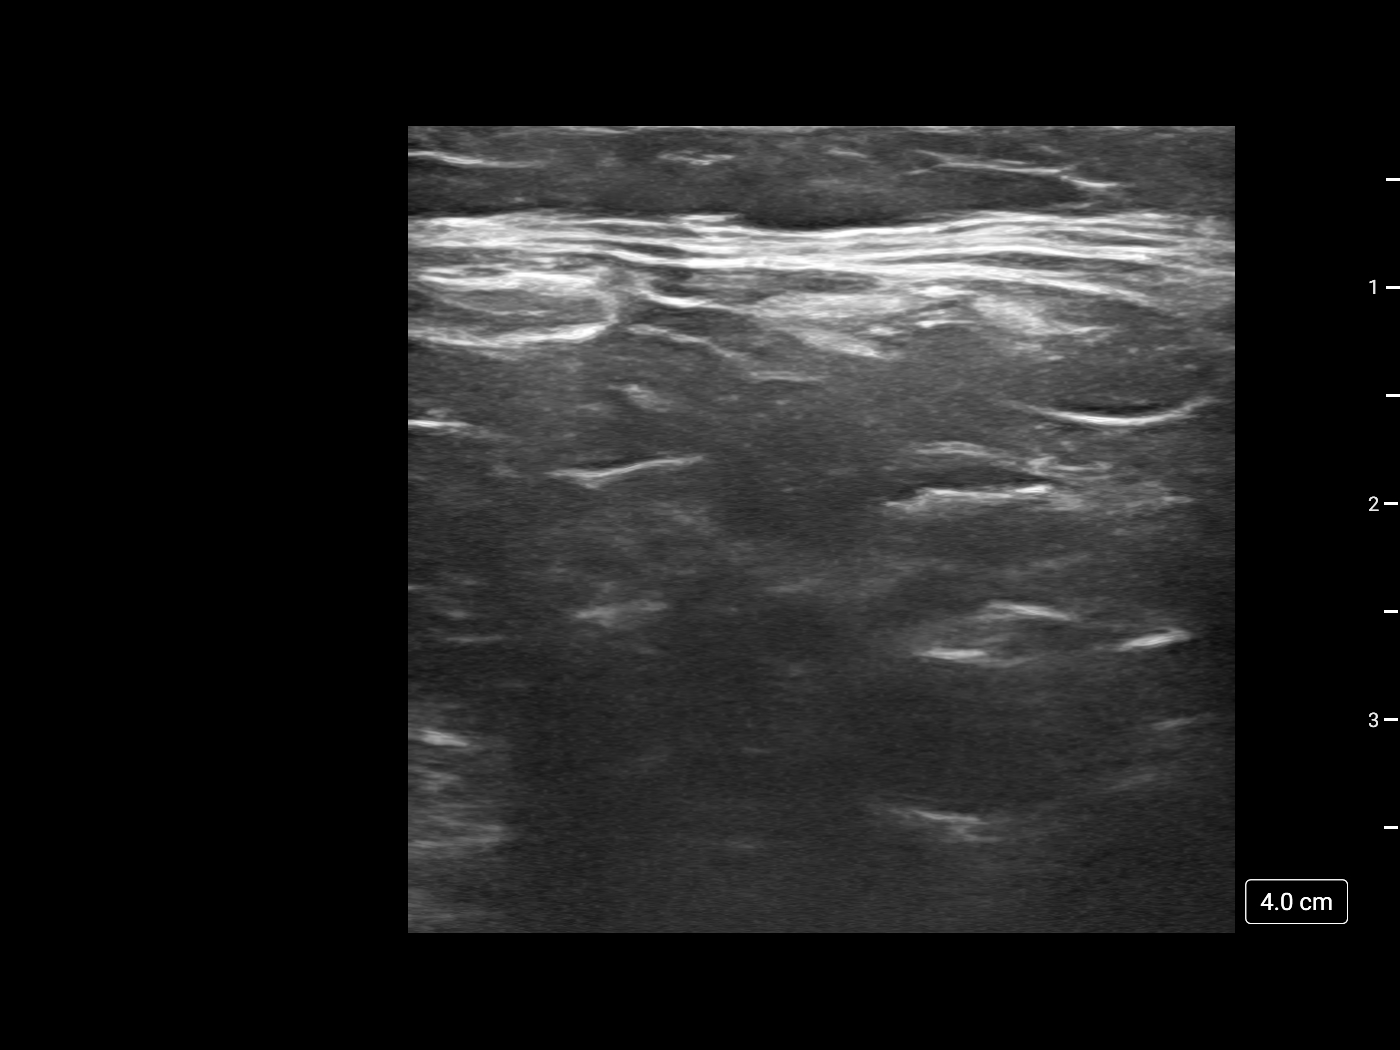

[Series 300: care single · 1 of 1 slices shown]
[im 1/1]
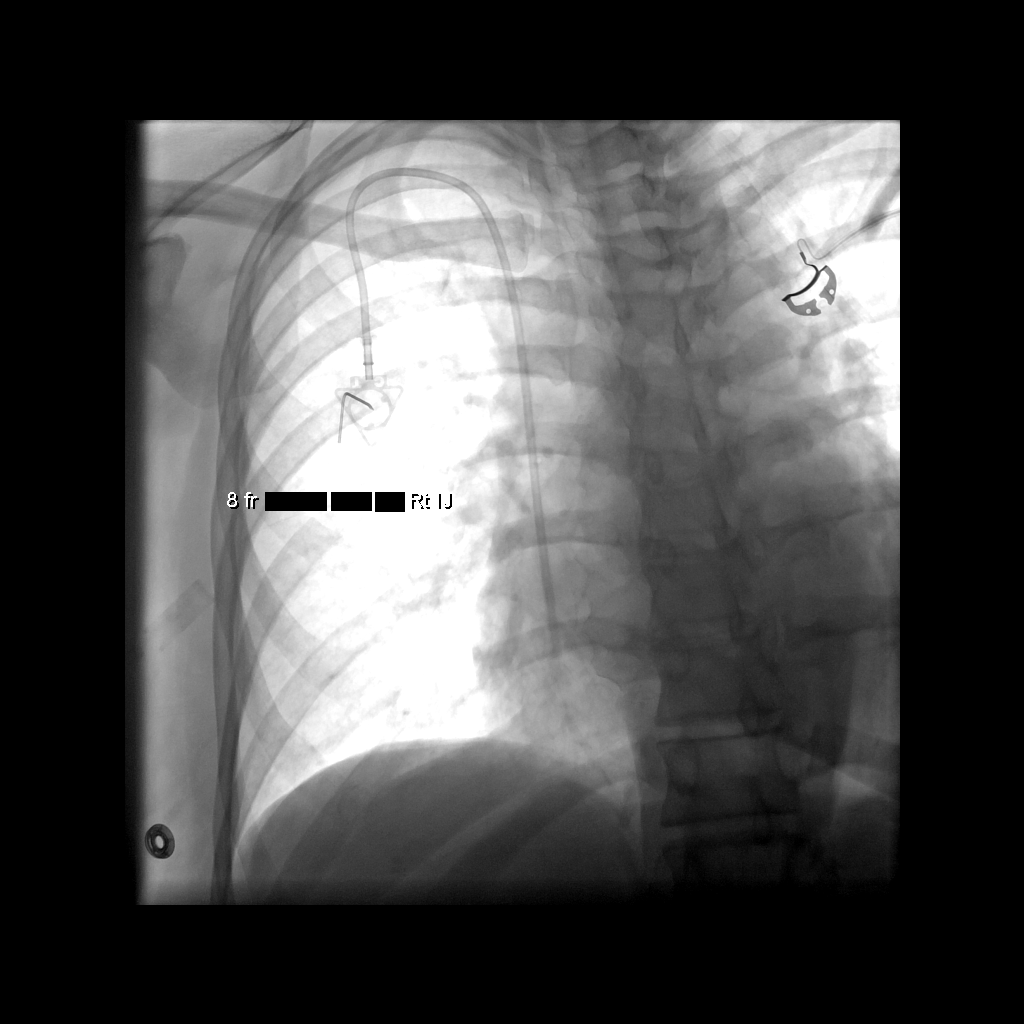

[3 of 3 positions shown; findings below may reference images not displayed]

MEDICATIONS:
Clindamycin 900 mg IV; The antibiotic was administered within an
appropriate time interval prior to skin puncture.

ANESTHESIA/SEDATION:
Moderate (conscious) sedation was employed during this procedure. A
total of Versed 4 mg and Fentanyl 100 mcg was administered
intravenously.

Moderate Sedation Time: 23 minutes. The patient's level of
consciousness and vital signs were monitored continuously by
radiology nursing throughout the procedure under my direct
supervision.

CONTRAST:  None

FLUOROSCOPY TIME:  36 seconds (3 mGy)

COMPLICATIONS:
None immediate.

PROCEDURE:
The procedure, risks, benefits, and alternatives were explained to
the patient. Questions regarding the procedure were encouraged and
answered. The patient understands and consents to the procedure.

The right neck and chest were prepped with chlorhexidine in a
sterile fashion, and a sterile drape was applied covering the
operative field. Maximum barrier sterile technique with sterile
gowns and gloves were used for the procedure. A timeout was
performed prior to the initiation of the procedure. Local anesthesia
was provided with 1% lidocaine with epinephrine.

After creating a small venotomy incision, a micropuncture kit was
utilized to access the internal jugular vein. Real-time ultrasound
guidance was utilized for vascular access including the acquisition
of a permanent ultrasound image documenting patency of the accessed
vessel. The microwire was utilized to measure appropriate catheter
length.

A subcutaneous port pocket was then created along the upper chest
wall utilizing a combination of sharp and blunt dissection. The
pocket was irrigated with sterile saline. A single lumen "ISP" sized
power injectable port was chosen for placement. The 8 Fr catheter
was tunneled from the port pocket site to the venotomy incision. The
port was placed in the pocket. The external catheter was trimmed to
appropriate length. At the venotomy, an 8 Fr peel-away sheath was
placed over a guidewire under fluoroscopic guidance. The catheter
was then placed through the sheath and the sheath was removed. Final
catheter positioning was confirmed and documented with a
fluoroscopic spot radiograph. The port was accessed with Danii Aujla
needle, aspirated and flushed with heparinized saline.

The venotomy site was closed with an interrupted 4-0 Vicryl suture.
The port pocket incision was closed with interrupted 2-0 Vicryl
suture and the skin was opposed with a running subcuticular 4-0
Vicryl suture. Dermabond and Seytu were applied to both
incisions. Dressings were placed. The patient tolerated the
procedure well without immediate post procedural complication.
FINDINGS: After catheter placement, the tip lies within the superior
cavoatrial junction. The catheter aspirates and flushes normally and
is ready for immediate use.
IMPRESSION: Successful placement of a right internal jugular approach power
injectable Port-A-Cath. The catheter is ready for immediate use.

## 2020-07-30 ENCOUNTER — Other Ambulatory Visit: Payer: Self-pay

## 2020-07-30 ENCOUNTER — Ambulatory Visit (INDEPENDENT_AMBULATORY_CARE_PROVIDER_SITE_OTHER): Payer: Medicaid Other | Admitting: Adult Health

## 2020-07-30 VITALS — BP 148/96 | HR 101 | Temp 97.9°F | Ht 62.0 in | Wt 226.0 lb

## 2020-07-30 DIAGNOSIS — G4733 Obstructive sleep apnea (adult) (pediatric): Secondary | ICD-10-CM | POA: Diagnosis not present

## 2020-07-30 DIAGNOSIS — B351 Tinea unguium: Secondary | ICD-10-CM

## 2020-07-30 DIAGNOSIS — Z6841 Body Mass Index (BMI) 40.0 and over, adult: Secondary | ICD-10-CM

## 2020-07-30 DIAGNOSIS — E8881 Metabolic syndrome: Secondary | ICD-10-CM

## 2020-07-31 DIAGNOSIS — B351 Tinea unguium: Secondary | ICD-10-CM | POA: Insufficient documentation

## 2020-07-31 DIAGNOSIS — G4733 Obstructive sleep apnea (adult) (pediatric): Secondary | ICD-10-CM | POA: Insufficient documentation

## 2020-07-31 NOTE — Progress Notes (Signed)
Chief Complaint:   OBESITY Tamara Osborne is here to discuss her progress with her obesity treatment plan along with follow-up of her obesity related diagnoses. Tamara Osborne is on the Category 2 Plan and states she is following her eating plan approximately 90% of the time. Tamara Osborne states she is not currently exercising.  Today's visit was #: 4 Starting weight: 208 lbs Starting date: 09/20/2019 Today's weight: 226 lbs Today's date: 07/30/2020 Total lbs lost to date: 0 Total lbs lost since last in-office visit: 1  Interim History: Tamara Osborne reports R foot pain is rated 3/10 and described as soreness. Pain will start after walking long periods. She has been slowly increasing daily walking as tolerated. Pt has been following hybrid Category 2 with intermittent fasting. Will eat between the times of 1000-1800 each day.  She denies feelings of hypoglycemia with this consumption timing.   Subjective:   1. Insulin resistance Tamara Osborne is on Victoza 1.8 mg QD.  Insulin level 05/20/2020- 53.2 with elevated A1c of 6.0. Pt denies mass in neck, dysphagia, dyspepsia, or persistent hoarseness. She feels that GI upset has decreased- no constipation.  Lab Results  Component Value Date   INSULIN 40.3 (H) 09/20/2019   Lab Results  Component Value Date   HGBA1C 6.0 (H) 05/20/2020   2. Onychomycosis of toenail Tamara Osborne will complete 3 months course of Lamisil in 2 weeks.  She feels that fungus/yellowing of toenails has decreased on L toenails.  No change to R toenails.  3. OSA (obstructive sleep apnea) Reviewed CPAP readings- sleeping 7-11 hours per night. Tamara Osborne reports decrease in fatigue and denies chest pain/dyspnea with exertion. She has not needed PRN clonidine.  Assessment/Plan:   1. Insulin resistance Tamara Osborne will continue to work on weight loss, exercise, and decreasing simple carbohydrates to help decrease the risk of diabetes. Tamara Osborne agreed to follow-up with [place] as directed to  closely monitor her progress. -Continue GLP-1 as directed. No refill needed today.  2. Onychomycosis of toenail Follow up with PCP. Continue to avoid hepatotoxic substances.  3. OSA (obstructive sleep apnea) Intensive lifestyle modifications are the first line treatment for this issue. We discussed several lifestyle modifications today and she will continue to work on diet, exercise and weight loss efforts. We will continue to monitor. Orders and follow up as documented in patient record.  -Continue nightly CPAP and daily anti-hypertensive's.  4. Class 3 severe obesity with serious comorbidity and body mass index (BMI) of 40.0 to 44.9 in adult, unspecified obesity type (HCC) Tamara Osborne is currently in the action stage of change. As such, her goal is to continue with weight loss efforts. She has agreed to the Category 2 Plan.   Will eat between the times of 1000-1800 each day.  She denies feelings of hypoglycemia with this consumption timing.   Exercise goals: Increase walking as tolerated.  Behavioral modification strategies: increasing lean protein intake, meal planning and cooking strategies and planning for success.  Tamara Osborne has agreed to follow-up with our clinic in 4 weeks. She was informed of the importance of frequent follow-up visits to maximize her success with intensive lifestyle modifications for her multiple health conditions.   Objective:   Blood pressure (!) 148/96, pulse (!) 101, temperature 97.9 F (36.6 C), height 5\' 2"  (1.575 m), weight 226 lb (102.5 kg), SpO2 97 %. Body mass index is 41.34 kg/m.  General: Cooperative, alert, well developed, in no acute distress. HEENT: Conjunctivae and lids unremarkable. Cardiovascular: Regular rhythm.  Lungs: Normal work of breathing. Neurologic:  No focal deficits.   Lab Results  Component Value Date   CREATININE 1.33 (H) 05/20/2020   BUN 21 (H) 05/20/2020   NA 134 (L) 05/20/2020   K 4.1 05/20/2020   CL 106 05/20/2020   CO2  24 05/20/2020   Lab Results  Component Value Date   ALT 38 05/20/2020   AST 20 05/20/2020   ALKPHOS 95 05/20/2020   BILITOT 0.4 05/20/2020   Lab Results  Component Value Date   HGBA1C 6.0 (H) 05/20/2020   HGBA1C 5.2 10/30/2019   HGBA1C CANCELED 09/20/2019   Lab Results  Component Value Date   INSULIN 40.3 (H) 09/20/2019   Lab Results  Component Value Date   TSH 1.910 09/20/2019   Lab Results  Component Value Date   CHOL 214 (H) 05/20/2020   HDL 32 (L) 05/20/2020   LDLCALC 145 (H) 05/20/2020   TRIG 184 (H) 05/20/2020   CHOLHDL 6.7 05/20/2020   Lab Results  Component Value Date   WBC 6.6 08/07/2019   HGB 11.8 (L) 08/07/2019   HCT 34.8 (L) 08/07/2019   MCV 87.7 08/07/2019   PLT 267 08/07/2019   Lab Results  Component Value Date   IRON 17 (L) 03/27/2019   TIBC 283 03/27/2019   FERRITIN 32 03/27/2019    Attestation Statements:   Reviewed by clinician on day of visit: allergies, medications, problem list, medical history, surgical history, family history, social history, and previous encounter notes.  Time spent on visit including pre-visit chart review and post-visit care and charting was 31 minutes.   Tamara Osborne, CMA, am acting as transcriptionist for Mina Marble, NP.  I have reviewed the above documentation for accuracy and completeness, and I agree with the above. -  Tamara Chunn d. Chaka Boyson, NP-C

## 2020-08-23 ENCOUNTER — Inpatient Hospital Stay: Payer: Medicaid Other | Attending: Gynecologic Oncology

## 2020-08-23 ENCOUNTER — Other Ambulatory Visit: Payer: Self-pay

## 2020-08-23 DIAGNOSIS — Z452 Encounter for adjustment and management of vascular access device: Secondary | ICD-10-CM | POA: Insufficient documentation

## 2020-08-23 DIAGNOSIS — C539 Malignant neoplasm of cervix uteri, unspecified: Secondary | ICD-10-CM | POA: Insufficient documentation

## 2020-08-23 DIAGNOSIS — Z95828 Presence of other vascular implants and grafts: Secondary | ICD-10-CM

## 2020-08-23 MED ORDER — SODIUM CHLORIDE 0.9% FLUSH
10.0000 mL | Freq: Once | INTRAVENOUS | Status: AC
Start: 2020-08-23 — End: 2020-08-23
  Administered 2020-08-23: 10 mL
  Filled 2020-08-23: qty 10

## 2020-08-23 MED ORDER — HEPARIN SOD (PORK) LOCK FLUSH 100 UNIT/ML IV SOLN
500.0000 [IU] | Freq: Once | INTRAVENOUS | Status: AC
  Administered 2020-08-23: 500 [IU]
  Filled 2020-08-23: qty 5

## 2020-08-27 ENCOUNTER — Other Ambulatory Visit: Payer: Self-pay

## 2020-08-27 ENCOUNTER — Telehealth (INDEPENDENT_AMBULATORY_CARE_PROVIDER_SITE_OTHER): Payer: Medicaid Other | Admitting: Adult Health

## 2020-08-27 ENCOUNTER — Encounter (INDEPENDENT_AMBULATORY_CARE_PROVIDER_SITE_OTHER): Payer: Self-pay | Admitting: Adult Health

## 2020-08-27 DIAGNOSIS — E8881 Metabolic syndrome: Secondary | ICD-10-CM

## 2020-08-27 DIAGNOSIS — Z6841 Body Mass Index (BMI) 40.0 and over, adult: Secondary | ICD-10-CM

## 2020-08-27 DIAGNOSIS — E559 Vitamin D deficiency, unspecified: Secondary | ICD-10-CM | POA: Diagnosis not present

## 2020-08-27 MED ORDER — VICTOZA 18 MG/3ML ~~LOC~~ SOPN: 1.8000 mg | PEN_INJECTOR | Freq: Every day | SUBCUTANEOUS | 0 refills | Status: DC

## 2020-08-28 NOTE — Progress Notes (Signed)
TeleHealth Visit:  Due to the COVID-19 pandemic, this visit was completed with telemedicine (audio/video) technology to reduce patient and provider exposure as well as to preserve personal protective equipment.   Tamara Osborne has verbally consented to this TeleHealth visit. The patient is located at home, the provider is located at the [facility] office. The participants in this visit include the listed provider and patient. The visit was conducted today via video.  Chief Complaint: OBESITY Tamara Osborne is here to discuss her progress with her obesity treatment plan along with follow-up of her obesity related diagnoses. Tamara Osborne is on the Category 2 Plan and states she is following her eating plan approximately 0% of the time. Tamara Osborne states she is not currently exercising.  Today's visit was #: 20 Starting weight: 208 lbs Starting date: 09/20/2019  Interim History: Tamara Osborne tested positive for COVID-19 via home antigen test 17 June 22. Her symptoms include- fatigue, pharyngitis, mild SOB, productive cough (clear mucus), and nasal drainage. She has had 2 COVID-19 MRNA vaccines- Moderna.  Her last dose was March 2022. She has not had booster.  Subjective:   1. Insulin resistance [name] is on Victoza 1.8 mg QD. Pt denies mass in neck, dysphagia, dyspepsia, persistent hoarseness, nausea, or constipation. 05/20/2020 A1c 6.0 and insulin level 53.5, down from 143 on 01/22/2020.  Lab Results  Component Value Date   INSULIN 40.3 (H) 09/20/2019   Lab Results  Component Value Date   HGBA1C 6.0 (H) 05/20/2020   2. Vitamin D deficiency She is currently taking OTC vitamin D 2,000 IU each day. She denies nausea, vomiting or muscle weakness.  Lab Results  Component Value Date   VD25OH 45.60 05/20/2020   VD25OH 69.37 01/22/2020   VD25OH 29.2 (L) 09/20/2019   Assessment/Plan:   1. Insulin resistance Lakedra will continue to work on weight loss, exercise, and decreasing simple  carbohydrates to help decrease the risk of diabetes. Stuti agreed to follow-up with [place] as directed to closely monitor her progress.  Refill- liraglutide (VICTOZA) 18 MG/3ML SOPN; Inject 1.8 mg into the skin daily.  Dispense: 9 mL; Refill: 0  2. Vitamin D deficiency Low Vitamin D level contributes to fatigue and are associated with obesity, breast, and colon cancer. She agrees to continue to take OTC Vitamin D @2 ,000 IU QD and will follow-up for routine testing of Vitamin D, at least 2-3 times per year to avoid over-replacement.  3. Class 3 severe obesity with serious comorbidity and body mass index (BMI) of 40.0 to 44.9 in adult, unspecified obesity type (HCC)  Tamara Osborne is currently in the action stage of change. As such, her goal is to continue with weight loss efforts. She has agreed to the Category 2 Plan.   -Check fasting labs at next OV.  Exercise goals: No exercise has been prescribed at this time.  Behavioral modification strategies: meal planning and cooking strategies, keeping healthy foods in the home, and planning for success.  Tamara Osborne has agreed to follow-up with our clinic in 4 weeks. She was informed of the importance of frequent follow-up visits to maximize her success with intensive lifestyle modifications for her multiple health conditions.  Objective:   VITALS: Per patient if applicable, see vitals. GENERAL: Alert and in no acute distress. CARDIOPULMONARY: No increased WOB. Speaking in clear sentences.  PSYCH: Pleasant and cooperative. Speech normal rate and rhythm. Affect is appropriate. Insight and judgement are appropriate. Attention is focused, linear, and appropriate.  NEURO: Oriented as arrived to appointment on time  with no prompting.   Lab Results  Component Value Date   CREATININE 1.33 (H) 05/20/2020   BUN 21 (H) 05/20/2020   NA 134 (L) 05/20/2020   K 4.1 05/20/2020   CL 106 05/20/2020   CO2 24 05/20/2020   Lab Results  Component Value Date   ALT  38 05/20/2020   AST 20 05/20/2020   ALKPHOS 95 05/20/2020   BILITOT 0.4 05/20/2020   Lab Results  Component Value Date   HGBA1C 6.0 (H) 05/20/2020   HGBA1C 5.2 10/30/2019   HGBA1C CANCELED 09/20/2019   Lab Results  Component Value Date   INSULIN 40.3 (H) 09/20/2019   Lab Results  Component Value Date   TSH 1.910 09/20/2019   Lab Results  Component Value Date   CHOL 214 (H) 05/20/2020   HDL 32 (L) 05/20/2020   LDLCALC 145 (H) 05/20/2020   TRIG 184 (H) 05/20/2020   CHOLHDL 6.7 05/20/2020   Lab Results  Component Value Date   VD25OH 45.60 05/20/2020   VD25OH 69.37 01/22/2020   VD25OH 29.2 (L) 09/20/2019   Lab Results  Component Value Date   WBC 6.6 08/07/2019   HGB 11.8 (L) 08/07/2019   HCT 34.8 (L) 08/07/2019   MCV 87.7 08/07/2019   PLT 267 08/07/2019   Lab Results  Component Value Date   IRON 17 (L) 03/27/2019   TIBC 283 03/27/2019   FERRITIN 32 03/27/2019    Attestation Statements:   Reviewed by clinician on day of visit: allergies, medications, problem list, medical history, surgical history, family history, social history, and previous encounter notes.  Coral Ceo, CMA, am acting as transcriptionist for Mina Marble, NP.  I have reviewed the above documentation for accuracy and completeness, and I agree with the above. - Tamla Winkels d. Aquanetta Schwarz, NP-C

## 2020-09-04 ENCOUNTER — Encounter: Payer: Self-pay | Admitting: Gynecologic Oncology

## 2020-09-06 ENCOUNTER — Inpatient Hospital Stay: Payer: Medicaid Other | Attending: Hematology and Oncology | Admitting: Gynecologic Oncology

## 2020-09-06 ENCOUNTER — Other Ambulatory Visit (HOSPITAL_COMMUNITY)
Admission: RE | Admit: 2020-09-06 | Discharge: 2020-09-06 | Disposition: A | Discharge status: Home / Self Care | Payer: Medicaid Other | Source: Ambulatory Visit | Attending: Gynecologic Oncology | Admitting: Gynecologic Oncology

## 2020-09-06 ENCOUNTER — Other Ambulatory Visit: Payer: Self-pay

## 2020-09-06 ENCOUNTER — Encounter: Payer: Self-pay | Admitting: Oncology

## 2020-09-06 VITALS — BP 183/94 | HR 96 | Temp 97.6°F | Resp 18 | Ht 62.0 in | Wt 226.0 lb

## 2020-09-06 DIAGNOSIS — I10 Essential (primary) hypertension: Secondary | ICD-10-CM | POA: Diagnosis not present

## 2020-09-06 DIAGNOSIS — Z923 Personal history of irradiation: Secondary | ICD-10-CM | POA: Diagnosis not present

## 2020-09-06 DIAGNOSIS — C539 Malignant neoplasm of cervix uteri, unspecified: Secondary | ICD-10-CM | POA: Insufficient documentation

## 2020-09-06 DIAGNOSIS — Z9221 Personal history of antineoplastic chemotherapy: Secondary | ICD-10-CM | POA: Insufficient documentation

## 2020-09-06 DIAGNOSIS — R32 Unspecified urinary incontinence: Secondary | ICD-10-CM | POA: Diagnosis not present

## 2020-09-06 DIAGNOSIS — N39498 Other specified urinary incontinence: Secondary | ICD-10-CM

## 2020-09-06 DIAGNOSIS — Z1231 Encounter for screening mammogram for malignant neoplasm of breast: Secondary | ICD-10-CM

## 2020-09-06 DIAGNOSIS — E1140 Type 2 diabetes mellitus with diabetic neuropathy, unspecified: Secondary | ICD-10-CM | POA: Insufficient documentation

## 2020-09-06 DIAGNOSIS — E669 Obesity, unspecified: Secondary | ICD-10-CM | POA: Diagnosis not present

## 2020-09-06 DIAGNOSIS — Z6841 Body Mass Index (BMI) 40.0 and over, adult: Secondary | ICD-10-CM | POA: Diagnosis not present

## 2020-09-06 DIAGNOSIS — L989 Disorder of the skin and subcutaneous tissue, unspecified: Secondary | ICD-10-CM

## 2020-09-06 NOTE — Patient Instructions (Addendum)
You are doing well. We will contact you with the results of your pap smear from today. You may have some vaginal spotting after the exam. Continue with the vaginal lubricant and coconut oil to help with dryness. We will also place a new referral for you to meet with the pelvic floor physical therapist with hopes this will improve your symptoms and help with urinary incontinence. We will also place a referral for you to meet with a dermatologist to have a thorough skin check.  We will also place an order for you to have a mammogram. We will contact you with an appointment.   Plan to follow up with Dr. Sondra Come in three months and Dr. Berline Lopes in six months or sooner if needed. Dr. Clabe Seal office will contact our office when you see him to arrange for an appointment with Dr. Berline Lopes.   Symptoms to report to your health care team include vaginal bleeding, rectal bleeding, bloating, weight loss without effort, new and persistent pain, new and  persistent fatigue, new leg swelling, new masses (i.e., bumps in your neck or groin), new and persistent cough, new and persistent nausea and vomiting, change in bowel or bladder habits, and any other concerns.

## 2020-09-06 NOTE — Progress Notes (Signed)
Gynecologic Oncology Return Clinic Visit  09/06/2020  Reason for Visit: surveillance visit in the setting of stage II SCC of the cervix  Treatment History: Oncology History  Squamous cell carcinoma of cervix ([place])  03/21/2019 Pathology Results   The biopsy is superficial and therefore depth of invasion cannot be  determined.  There is at least carcinoma in-situ.    03/23/2019 Initial Diagnosis   Squamous cell carcinoma of cervix ([place])    03/23/2019 Pathology Results   CERVIX, 11 O CLOCK, BIOPSY:  -  Squamous cell carcinoma, invasive  -  See comment   03/23/2019 - 06/05/2019 Radiation Therapy   External beam therapy was from 03/23/2019 - 05/04/2019. HDR vaginal brachytherapy was from 05/19/2019 - 06/05/2019.   Site Technique Total Dose (Gy) Dose per Fx (Gy) Completed Fx Beam Energies  Cervix: Cervix HDR-brachy 5.5/5.5 5.5 1/1 Ir-192  Cervix: Cervix HDR-brachy 5.5/5.5 5.5 1/1 Ir-192  Cervix: Cervix_Bst HDR-brachy 5.5/5.5 5.5 1/1 Ir-192  Cervix: Cervix HDR-brachy 5.5/5.5 5.5 1/1 Ir-192  Cervix: Cervix 3D 45/45 1.8 25/25 10X, 15X  Cervix: Cervix HDR-brachy 5.5/5.5 5.5 1/1 Ir-192  Cervix: Cervix_Bst 3D 9/9 1.8 5/5 15X      03/28/2019 PET scan   1. Hypermetabolic cervical mass measures roughly 7.6 by 6.9 by 4.9 cm, compatible with malignancy. No adenopathy or distant metastatic spread identified. 2. Extending cephalad and anterior to the uterine fundus, there is a 450 cubic cm simple fluid density structure without hypermetabolic activity. This may represent a right adnexal cyst (favored), peritoneal inclusion cyst, or (if the patient has a remote history of pancreatitis) a remote pseudocyst. 3. Faint ground density nodularity in both lower lobes which could be from atypical infection or extrinsic allergic alveolitis. Given nodular appearance of some of this ground-glass density, I would recommend follow up chest CT in 3 months time in order to ensure clearance. 4. Moderate cardiomegaly,  cause uncertain. The patient also has advanced for age/gender atherosclerosis. Aortic Atherosclerosis (ICD10-I70.0).     04/04/2019 Procedure   Successful placement of a right internal jugular approach power injectable Port-A-Cath. The catheter is ready for immediate use   04/07/2019 - 05/12/2019 Chemotherapy   The patient had weekly cisplatin for chemotherapy treatment.     09/06/2019 PET scan   1. No evidence residual hypermetabolic cervical tissue. 2. No evidence of metastatic cervical carcinoma. 3. Large benign cystic mass unchanged in the upper pelvis.         Interval History: The patient has done well since her last visit. She states she has been meeting with a weight management practice but feels she is having difficulty getting the weight off. She is attempting to follow the diet plan but states some items such as cheese are causing increased constipation and gas. She was started on a new medication for insulin resistance, which she feels has decreased her appetite slightly. She feels her abdomen is tight intermittently, which she correlates to the increased weight gain and dietary changes. She reports increase in urinary incontinence. She was recently given a CPAP for sleep apnea, which has helped her sleep 8 hours straight through the night but she has been having episdoes of incontinence during the night. She states she feels she goes into such a deep sleep and occasionally dreams she is urinating and wakes up with the bed wet.   She has been using her dilator and having intercourse around once a month. She is reporting vaginal spotting after intercourse and after using the dilator but no spotting noted  in between those events. She states at times, it feels that the dilator is getting stuck to the walls of the vagina and she feels a tearing sensation with removal. She states she felt her pain with intercourse and urinary incontinence was much better when she was going to physical therapy  for pelvic floor. She had to stop going due to insurance reasons but would be interested in seeing the therapist again. She cannot remember when her last mammogram was. For vaginal and vulvar dryness, she is using natural lubricants, such as coconut lotion and oil.   She had to have fluid drawn off on her right great toe and now reports non healing fungus-like area on another toe on that foot. She has been seen by a podiatrist and states she will try to get an appointment for further evaluation. She reports neuropathy symptoms in both feet and her fingers. She states it is worse in the am after sleeping for 8 hours. She denies recent falls. She is able to feel the ground when walking. She states she has intermittent diarrhea, which she correlates to her recent addition of the new diabetic medication. No lower extremity edema reported. She is trying to switch primary care providers and states she is on a waiting list. She has new skin lesions and has not seen a dermatologist.  Past Medical/Surgical History: Past Medical History:  Diagnosis Date   Anemia    Anxiety    Bartholin cyst    Cervical cancer ([place])    Constipation    Depression    Deviated septum    Diabetes mellitus without complication ([place])    Difficult intravenous access    used port for 05-09-2019 surgery   Fibroids    History of blood transfusion    2 units given 04-26-2019, has had total of 8 units since jan 2021   History of blood transfusion 1 unit given 05-30-19   iv fluids also given   History of kidney problems    History of radiation therapy 03/23/2019-05/04/2019   Cervical IMRT   Dr Gery Pray   History of radiation therapy 05/09/2019-06/05/2019   vaginal brachytherapy   Dr Gery Pray   History of recent blood transfusion 05/16/2019   2 units given  per dr Pollyann Savoy at cancer center   Hypertension    Neuropathy    both thumbs   Obesity    Palpitations    PONV (postoperative nausea and vomiting)    Prediabetes     Preeclampsia 2006   Sleep apnea    no cpap used insurance would not cover, osa severe per pt   SOB (shortness of breath)    Swallowing difficulty     Past Surgical History:  Procedure Laterality Date   CESAREAN SECTION WITH BILATERAL TUBAL LIGATION  11/10/2004       DILATION AND CURETTAGE OF UTERUS  1997   IR IMAGING GUIDED PORT INSERTION  04/04/2019   OPERATIVE ULTRASOUND N/A 05/09/2019   Procedure: OPERATIVE ULTRASOUND;  Surgeon: Gery Pray, MD;  Location: [facility];  Service: Urology;  Laterality: N/A;   OPERATIVE ULTRASOUND N/A 05/15/2019   Procedure: OPERATIVE ULTRASOUND;  Surgeon: Gery Pray, MD;  Location: [facility];  Service: Urology;  Laterality: N/A;   OPERATIVE ULTRASOUND N/A 05/22/2019   Procedure: OPERATIVE ULTRASOUND;  Surgeon: Gery Pray, MD;  Location: [facility];  Service: Urology;  Laterality: N/A;   OPERATIVE ULTRASOUND N/A 05/29/2019   Procedure: OPERATIVE ULTRASOUND;  Surgeon: Sondra Come,  Jeneen Rinks, MD;  Location: [facility];  Service: Urology;  Laterality: N/A;   OPERATIVE ULTRASOUND N/A 06/05/2019   Procedure: OPERATIVE ULTRASOUND;  Surgeon: Gery Pray, MD;  Location: [facility];  Service: Urology;  Laterality: N/A;   TANDEM RING INSERTION N/A 05/09/2019   Procedure: TANDEM RING INSERTION;  Surgeon: Gery Pray, MD;  Location: [facility];  Service: Urology;  Laterality: N/A;   TANDEM RING INSERTION N/A 05/15/2019   Procedure: TANDEM RING INSERTION;  Surgeon: Gery Pray, MD;  Location: [facility];  Service: Urology;  Laterality: N/A;   TANDEM RING INSERTION N/A 05/22/2019   Procedure: TANDEM RING INSERTION;  Surgeon: Gery Pray, MD;  Location: [facility];  Service: Urology;  Laterality: N/A;   TANDEM RING INSERTION N/A 05/29/2019   Procedure: TANDEM RING INSERTION;  Surgeon: Gery Pray, MD;  Location: [facility];   Service: Urology;  Laterality: N/A;   TANDEM RING INSERTION N/A 06/05/2019   Procedure: TANDEM RING INSERTION;  Surgeon: Gery Pray, MD;  Location: [facility];  Service: Urology;  Laterality: N/A;    Family History  Problem Relation Age of Onset   Brain cancer Mother        lung cancer to brain   Hypertension Mother    Cancer Mother    Depression Mother    Anxiety disorder Mother    Diabetes Father    Kidney disease Father    Cancer Father        kidney cancer   Heart failure Sister     Social History   Socioeconomic History   Marital status: Single    Spouse name: Not on file   Number of children: 4   Years of education: Not on file   Highest education level: Not on file  Occupational History   Not on file  Tobacco Use   Smoking status: Never   Smokeless tobacco: Never  Vaping Use   Vaping Use: Never used  Substance and Sexual Activity   Alcohol use: No   Drug use: No   Sexual activity: Not Currently    Birth control/protection: Surgical  Other Topics Concern   Not on file  Social History Narrative   Lives with her boyfriend   Social Determinants of Health   Financial Resource Strain: Not on file  Food Insecurity: Not on file  Transportation Needs: Not on file  Physical Activity: Not on file  Stress: Not on file  Social Connections: Not on file    Current Medications:  Current Outpatient Medications:    ASHWAGANDHA PO, Take by mouth at bedtime. Blue [place] Gummies, Disp: , Rfl:    Cholecalciferol (VITAMIN D3) 50 MCG (2000 UT) TABS, Take 2,000 Units by mouth daily., Disp: , Rfl:    CLONIDINE HCL PO, Take 0.1 mg by mouth in the morning and at bedtime. Pending BP readings, Disp: , Rfl:    Insulin Pen Needle (BD PEN NEEDLE NANO 2ND GEN) 32G X 4 MM MISC, 1 Package by Does not apply route 2 (two) times daily. (Patient taking differently: 1 Package by Does not apply route daily after supper.), Disp: 100 each, Rfl: 0   liraglutide (VICTOZA) 18  MG/3ML SOPN, Inject 1.8 mg into the skin daily., Disp: 9 mL, Rfl: 0   valsartan (DIOVAN) 160 MG tablet, Take 320 mg by mouth daily., Disp: , Rfl:    amLODipine (NORVASC) 10 MG tablet, Take 1 tablet (10 mg total) by mouth  daily., Disp: 90 tablet, Rfl: 0   fluticasone (FLONASE) 50 MCG/ACT nasal spray, Place 1 spray into both nostrils daily as needed for allergies or rhinitis.  (Patient not taking: Reported on 09/04/2020), Disp: , Rfl:    mupirocin ointment (BACTROBAN) 2 %, 1 application 3 (three) times daily. (Patient not taking: Reported on 09/04/2020), Disp: , Rfl:   Review of Systems: Denies fevers, chills, fatigue, unexplained weight changes. Slight decrease in appetite related to addition of Victoza. Denies hearing loss, neck lumps or masses, mouth sores, ringing in ears or voice changes. Denies cough or wheezing.  Denies shortness of breath. Denies chest pain or palpitations. Denies leg swelling. Positive for abdominal tightness intermittently, diarrhea/constipation intermittently. Denies pain, blood in stools, nausea, vomiting, or early satiety. Positive for pain with intercourse and spotting after intercourse and dilator use. Positive for increase in incontinence. No dysuria, frequency, or hematuria. Positive for vaginal spotting. Denies hot flashes, pelvic pain, or vaginal discharge.   Denies joint pain, back pain or muscle pain/cramps. Denies itching, rash, or wounds. Positive for new skin lesions on her arms, chest, and back. No lesions reported on her buttocks/vulva.  Denies new dizziness, headaches, numbness or seizures. Positive for neuropathy in bilateral hands and feet. Denies swollen lymph nodes or glands, denies easy bruising or bleeding. Denies anxiety, depression, confusion, or decreased concentration.  Physical Exam: BP (!) 183/94 (BP Location: Left Arm, Patient Position: Sitting)   Pulse 96   Temp 97.6 F (36.4 C) (Tympanic)   Resp 18   Ht 5\' 2"  (1.575 m)   Wt 226 lb (102.5  kg)   SpO2 100%   BMI 41.34 kg/m  General: Alert, oriented, no acute distress. HEENT: Atraumatic, normocephalic, sclera anicteric. Chest: Unlabored breathing on room air. Abdomen: Obese, soft, nontender.  Normoactive bowel sounds.  No masses or hepatosplenomegaly appreciated but limited due to habitus.   Extremities: Grossly normal range of motion.  Warm, well perfused.  No edema bilaterally. 3rd toe on the right foot with thickened toenail. Skin: Several lesions on her right arm and hand that appear mildly verrucous. Lesions on upper back, left chest, right shoulder appearing similar to seborrheic keratosis.  Lymphatics: No cervical, supraclavicular, or inguinal adenopathy. GU: Normal appearing external genitalia without erythema, excoriation, or lesions.  Speculum exam reveals atrophic appearing vaginal mucosa and cervix which is flush with the vaginal apex with significant radiation changes.  A thin prep pap smear was obtained without difficulty. No lesions noted.  No nodularity appreciated on rectovaginal exam. Right chest port site without erythema  Laboratory & Radiologic Studies: Cervical biopsy on 12/14/19: A. CERVIX, BIOPSY:  - Erosive cervicitis with reactive changes  - Negative for dysplasia or malignancy   Assessment & Plan: Tamara Osborne is a 45 y.o. woman with Stage IIB SCC who presents for surveillance visit.   Patient is NED on exam today and overall doing well.  A pap smear was obtained today and the patient will be contacted with the results. A referral has been placed for breast cancer screening via digital mammography, dermatology to establish care, and for re-evaluation with pelvic floor physical therapy given her persistent pelvic symptoms and worsening incontinence. It was discussed if her insurance would not allow for additional physical therapy appointments, we would sent in a referral for her to meet with a urologist.  In terms of surveillance, we discussed  surveillance visits every 3 months alternating between Dr. Berline Lopes and Dr. Sondra Come in radiation oncology. She is advised to continue use  of her dilator and of the vaginal lubricants as needed. We reviewed signs and symptoms that would be concerning for disease recurrence and the patient knows to call if she develops any of these sooner. She is advised to call for any questions or needs.  30 minutes of total time was spent for this patient encounter, including preparation, face-to-face counseling with the patient and coordination of care, and documentation of the encounter.  [name] NP

## 2020-09-09 ENCOUNTER — Encounter: Payer: Self-pay | Admitting: Hematology and Oncology

## 2020-09-09 ENCOUNTER — Telehealth: Payer: Self-pay | Admitting: Oncology

## 2020-09-09 DIAGNOSIS — R32 Unspecified urinary incontinence: Secondary | ICD-10-CM | POA: Insufficient documentation

## 2020-09-09 NOTE — Telephone Encounter (Addendum)
Called [facility] and they will call [place] for an appointment for a mammogram.    [facility] and scheduled patient for 03/06/2021 at 11:00.  Referral faxed to [phone].  Shaune Pascal of the appointment for dermatology.  Also reached out to outpatient PT at [facility].  They will call [place] to schedule an appointment.

## 2020-09-17 ENCOUNTER — Telehealth: Payer: Self-pay | Admitting: Oncology

## 2020-09-17 LAB — CYTOLOGY - PAP
Comment: NEGATIVE
Diagnosis: UNDETERMINED — AB
High risk HPV: NEGATIVE

## 2020-09-17 NOTE — Telephone Encounter (Signed)
Tamara Osborne called and was advised of her PAP results.  She verbalized understanding and did not have any questions.

## 2020-09-18 ENCOUNTER — Telehealth: Payer: Self-pay | Admitting: Oncology

## 2020-09-18 NOTE — Telephone Encounter (Signed)
Left a message regarding follow up apt with Dr. Berline Lopes on 10/16/20.  Requested a return call.

## 2020-09-18 NOTE — Telephone Encounter (Signed)
Tamara Osborne called back and confirmed follow up on 8/17 with Dr. Berline Lopes.  Advised her that Dr. Berline Lopes reviewed her pap smear results and thinks it is most like radiation changes and that she would like to see her next month for an exam.

## 2020-09-19 ENCOUNTER — Other Ambulatory Visit: Payer: Self-pay

## 2020-09-19 ENCOUNTER — Ambulatory Visit (HOSPITAL_COMMUNITY)
Admission: RE | Admit: 2020-09-19 | Discharge: 2020-09-19 | Disposition: A | Discharge status: Home / Self Care | Payer: Medicaid Other | Source: Ambulatory Visit | Attending: Gynecologic Oncology | Admitting: Gynecologic Oncology

## 2020-09-19 DIAGNOSIS — Z1231 Encounter for screening mammogram for malignant neoplasm of breast: Secondary | ICD-10-CM | POA: Diagnosis not present

## 2020-09-24 ENCOUNTER — Ambulatory Visit (INDEPENDENT_AMBULATORY_CARE_PROVIDER_SITE_OTHER): Payer: Medicaid Other | Admitting: Adult Health

## 2020-09-24 ENCOUNTER — Encounter (INDEPENDENT_AMBULATORY_CARE_PROVIDER_SITE_OTHER): Payer: Self-pay | Admitting: Adult Health

## 2020-09-24 ENCOUNTER — Other Ambulatory Visit: Payer: Self-pay

## 2020-09-24 VITALS — BP 139/81 | HR 89 | Temp 98.2°F | Ht 62.0 in | Wt 226.0 lb

## 2020-09-24 DIAGNOSIS — E8881 Metabolic syndrome: Secondary | ICD-10-CM | POA: Diagnosis not present

## 2020-09-24 DIAGNOSIS — I10 Essential (primary) hypertension: Secondary | ICD-10-CM | POA: Diagnosis not present

## 2020-09-24 DIAGNOSIS — Z6841 Body Mass Index (BMI) 40.0 and over, adult: Secondary | ICD-10-CM

## 2020-09-25 ENCOUNTER — Telehealth: Payer: Self-pay | Admitting: Oncology

## 2020-09-25 DIAGNOSIS — C539 Malignant neoplasm of cervix uteri, unspecified: Secondary | ICD-10-CM

## 2020-09-25 DIAGNOSIS — E559 Vitamin D deficiency, unspecified: Secondary | ICD-10-CM

## 2020-09-25 DIAGNOSIS — E8881 Metabolic syndrome: Secondary | ICD-10-CM

## 2020-09-25 DIAGNOSIS — R7303 Prediabetes: Secondary | ICD-10-CM

## 2020-09-25 NOTE — Telephone Encounter (Signed)
Called Tamara Osborne and notified her that we will be drawing her weight management labs at her port flush apt on 10/18/2020.  Also discussed that Dr. Alvy Bimler needs to see her once a year due to her port and the appointment has been scheduled for 10/18/2020 at 12:40.  She verbalized understanding and agreement.

## 2020-09-26 NOTE — Progress Notes (Signed)
Chief Complaint:   OBESITY Tamara Osborne is here to discuss her progress with her obesity treatment plan along with follow-up of her obesity related diagnoses. Tamara Osborne is on the Category 2 Plan and states she is following her eating plan approximately 90% of the time. Tamara Osborne states she is not currently exercising.  Today's visit was #: 21 Starting weight: 208 lbs Starting date: 09/20/2019 Today's weight: 226 lbs Today's date: 09/24/2020 Total lbs lost to date: 0 Total lbs lost since last in-office visit: 0  Interim History: Tamara Osborne's BP was quite elevated at recent dental appt and was sent to her PCP. Tamara Osborne in [place]- PCP increased Valsartan from 160 mg to 320 mg, then changes antihypertensive to Exforge 10-320 mg (she is already on amlodipine 10 mg QD). She has a follow up with PCP in 2-3 weeks.  Subjective:   1. Essential hypertension BP/HR stable at OV. Tamara Osborne is on amlodipine 10 mg and denies lower extremity edema. She is on Valsartan 160 mg- 2 tabs QD- recently increased from 1 tab and then provided Exforge 10-320 mg QD.  2. Insulin resistance Tamara Osborne is on Victoza 1.8 mg QD. Pt denies mass in neck, dysphagia, dyspepsia, persistent hoarseness, or nausea.  Assessment/Plan:   1. Essential hypertension Tamara Osborne is working on healthy weight loss and exercise to improve blood pressure control. We will watch for signs of hypotension as she continues her lifestyle modifications. Tamara Osborne will continue daily ExForge 10-320 mg QD. Follow up with PCP in 3 weeks.  2. Insulin resistance Tamara Osborne will continue to work on weight loss, exercise, and decreasing simple carbohydrates to help decrease the risk of diabetes. Tamara Osborne agreed to follow-up with [place] as directed to closely monitor her progress. Continue Victoza 1.8 mg QD. Increase protein and increase daily walking.  3. Class 3 severe obesity with serious comorbidity and body mass index (BMI) of 40.0 to 44.9 in  adult, unspecified obesity type (HCC)  Tamara Osborne is currently in the action stage of change. As such, her goal is to continue with weight loss efforts. She has agreed to the Category 2 Plan.   Fasting labs- request labs to be drawn by Tamara Osborne. Message sent to staff at Tamara Osborne.  Exercise goals:  Increase daily walking- 10 minutes daily. Re-start PT (pelvis floor bilateral lower extremity neuropathy)  Behavioral modification strategies: increasing lean protein intake, decreasing simple carbohydrates, meal planning and cooking strategies, keeping healthy foods in the home, and planning for success.  Tamara Osborne has agreed to follow-up with our clinic in 3 weeks. She was informed of the importance of frequent follow-up visits to maximize her success with intensive lifestyle modifications for her multiple health conditions.   Objective:   Blood pressure 139/81, pulse 89, temperature 98.2 F (36.8 C), height '5\' 2"'$  (1.575 m), weight 226 lb (102.5 kg), SpO2 97 %. Body mass index is 41.34 kg/m.  General: Cooperative, alert, well developed, in no acute distress. HEENT: Conjunctivae and lids unremarkable. Cardiovascular: Regular rhythm.  Lungs: Normal work of breathing. Neurologic: No focal deficits.   Lab Results  Component Value Date   CREATININE 1.33 (H) 05/20/2020   BUN 21 (H) 05/20/2020   NA 134 (L) 05/20/2020   K 4.1 05/20/2020   CL 106 05/20/2020   CO2 24 05/20/2020   Lab Results  Component Value Date   ALT 38 05/20/2020   AST 20 05/20/2020   ALKPHOS 95 05/20/2020   BILITOT 0.4 05/20/2020   Lab Results  Component Value Date  HGBA1C 6.0 (H) 05/20/2020   HGBA1C 5.2 10/30/2019   HGBA1C CANCELED 09/20/2019   Lab Results  Component Value Date   INSULIN 40.3 (H) 09/20/2019   Lab Results  Component Value Date   TSH 1.910 09/20/2019   Lab Results  Component Value Date   CHOL 214 (H) 05/20/2020   HDL 32 (L) 05/20/2020   LDLCALC 145 (H) 05/20/2020   TRIG 184 (H)  05/20/2020   CHOLHDL 6.7 05/20/2020   Lab Results  Component Value Date   VD25OH 45.60 05/20/2020   VD25OH 69.37 01/22/2020   VD25OH 29.2 (L) 09/20/2019   Lab Results  Component Value Date   WBC 6.6 08/07/2019   HGB 11.8 (L) 08/07/2019   HCT 34.8 (L) 08/07/2019   MCV 87.7 08/07/2019   PLT 267 08/07/2019   Lab Results  Component Value Date   IRON 17 (L) 03/27/2019   TIBC 283 03/27/2019   FERRITIN 32 03/27/2019    Attestation Statements:   Reviewed by clinician on day of visit: allergies, medications, problem list, medical history, surgical history, family history, social history, and previous encounter notes.  Time spent on visit including pre-visit chart review and post-visit care and charting was 30 minutes.   Coral Ceo, CMA, am acting as transcriptionist for Mina Marble, NP.  I have reviewed the above documentation for accuracy and completeness, and I agree with the above. -  Shakiyla Kook d. Jermey Closs, NP-C

## 2020-10-11 ENCOUNTER — Encounter: Payer: Self-pay | Admitting: Hematology and Oncology

## 2020-10-15 ENCOUNTER — Ambulatory Visit (INDEPENDENT_AMBULATORY_CARE_PROVIDER_SITE_OTHER): Payer: Medicaid Other | Admitting: Adult Health

## 2020-10-16 ENCOUNTER — Inpatient Hospital Stay: Payer: Medicaid Other | Attending: Hematology and Oncology | Admitting: Gynecologic Oncology

## 2020-10-16 ENCOUNTER — Other Ambulatory Visit: Payer: Self-pay

## 2020-10-16 VITALS — BP 164/92 | HR 110 | Temp 98.0°F | Resp 18 | Ht 62.0 in | Wt 230.4 lb

## 2020-10-16 DIAGNOSIS — F32A Depression, unspecified: Secondary | ICD-10-CM | POA: Insufficient documentation

## 2020-10-16 DIAGNOSIS — G4730 Sleep apnea, unspecified: Secondary | ICD-10-CM | POA: Insufficient documentation

## 2020-10-16 DIAGNOSIS — E119 Type 2 diabetes mellitus without complications: Secondary | ICD-10-CM | POA: Insufficient documentation

## 2020-10-16 DIAGNOSIS — F419 Anxiety disorder, unspecified: Secondary | ICD-10-CM | POA: Insufficient documentation

## 2020-10-16 DIAGNOSIS — Z923 Personal history of irradiation: Secondary | ICD-10-CM | POA: Insufficient documentation

## 2020-10-16 DIAGNOSIS — Z9221 Personal history of antineoplastic chemotherapy: Secondary | ICD-10-CM | POA: Diagnosis not present

## 2020-10-16 DIAGNOSIS — N3946 Mixed incontinence: Secondary | ICD-10-CM

## 2020-10-16 DIAGNOSIS — R32 Unspecified urinary incontinence: Secondary | ICD-10-CM | POA: Insufficient documentation

## 2020-10-16 DIAGNOSIS — Z794 Long term (current) use of insulin: Secondary | ICD-10-CM | POA: Insufficient documentation

## 2020-10-16 DIAGNOSIS — Z79899 Other long term (current) drug therapy: Secondary | ICD-10-CM | POA: Insufficient documentation

## 2020-10-16 DIAGNOSIS — C539 Malignant neoplasm of cervix uteri, unspecified: Secondary | ICD-10-CM

## 2020-10-16 DIAGNOSIS — R3915 Urgency of urination: Secondary | ICD-10-CM | POA: Diagnosis not present

## 2020-10-16 DIAGNOSIS — N941 Unspecified dyspareunia: Secondary | ICD-10-CM | POA: Insufficient documentation

## 2020-10-16 DIAGNOSIS — I10 Essential (primary) hypertension: Secondary | ICD-10-CM | POA: Diagnosis not present

## 2020-10-16 DIAGNOSIS — N949 Unspecified condition associated with female genital organs and menstrual cycle: Secondary | ICD-10-CM

## 2020-10-16 DIAGNOSIS — Z8541 Personal history of malignant neoplasm of cervix uteri: Secondary | ICD-10-CM | POA: Insufficient documentation

## 2020-10-16 NOTE — Patient Instructions (Addendum)
It is good to see you.  I only see changes that are consistent with your radiation treatment.  You are seeing Dr. Sondra Come for follow-up in October, I will see you back for follow-up in January.  Please call the clinic in December to get this scheduled.  Please let my clinic know how you are doing from your urinary symptoms after another month or 2 of physical therapy.  If you feel that things have not improved significantly, we can place a referral for urology.  You develop any new symptoms such as vaginal bleeding or pelvic pain before your next visit, please call to see me sooner.

## 2020-10-16 NOTE — Progress Notes (Signed)
Gynecologic Oncology Return Clinic Visit  10/16/2020  Reason for Visit: Follow-up abnormal Pap smear last month  Treatment History: Oncology History  Squamous cell carcinoma of cervix ([place])  03/21/2019 Pathology Results   The biopsy is superficial and therefore depth of invasion cannot be  determined.  There is at least carcinoma in-situ.    03/23/2019 Initial Diagnosis   Squamous cell carcinoma of cervix ([place])   03/23/2019 Pathology Results   CERVIX, 11 O CLOCK, BIOPSY:  -  Squamous cell carcinoma, invasive  -  See comment   03/23/2019 - 06/05/2019 Radiation Therapy   External beam therapy was from 03/23/2019 - 05/04/2019. HDR vaginal brachytherapy was from 05/19/2019 - 06/05/2019.   Site Technique Total Dose (Gy) Dose per Fx (Gy) Completed Fx Beam Energies  Cervix: Cervix HDR-brachy 5.5/5.5 5.5 1/1 Ir-192  Cervix: Cervix HDR-brachy 5.5/5.5 5.5 1/1 Ir-192  Cervix: Cervix_Bst HDR-brachy 5.5/5.5 5.5 1/1 Ir-192  Cervix: Cervix HDR-brachy 5.5/5.5 5.5 1/1 Ir-192  Cervix: Cervix 3D 45/45 1.8 25/25 10X, 15X  Cervix: Cervix HDR-brachy 5.5/5.5 5.5 1/1 Ir-192  Cervix: Cervix_Bst 3D 9/9 1.8 5/5 15X      03/28/2019 PET scan   1. Hypermetabolic cervical mass measures roughly 7.6 by 6.9 by 4.9 cm, compatible with malignancy. No adenopathy or distant metastatic spread identified. 2. Extending cephalad and anterior to the uterine fundus, there is a 450 cubic cm simple fluid density structure without hypermetabolic activity. This may represent a right adnexal cyst (favored), peritoneal inclusion cyst, or (if the patient has a remote history of pancreatitis) a remote pseudocyst. 3. Faint ground density nodularity in both lower lobes which could be from atypical infection or extrinsic allergic alveolitis. Given nodular appearance of some of this ground-glass density, I would recommend follow up chest CT in 3 months time in order to ensure clearance. 4. Moderate cardiomegaly, cause uncertain. The patient  also has advanced for age/gender atherosclerosis. Aortic Atherosclerosis (ICD10-I70.0).     04/04/2019 Procedure   Successful placement of a right internal jugular approach power injectable Port-A-Cath. The catheter is ready for immediate use   04/07/2019 - 05/12/2019 Chemotherapy   The patient had weekly cisplatin for chemotherapy treatment.     09/06/2019 PET scan   1. No evidence residual hypermetabolic cervical tissue. 2. No evidence of metastatic cervical carcinoma. 3. Large benign cystic mass unchanged in the upper pelvis.         Interval History: Patient presents today for follow-up of her Pap smear last month which was ASCUS, HPV negative.  She is overall doing well.  She continues to have some pain and discomfort with intercourse as well as using her vaginal dilator.  She has been using coconut oil or a lubricant that her pelvic PT told her about.  She has occasional spotting when she uses the dilator.  Due to insurance reasons, she has had to pause going to pelvic PT but is going back in September.  She felt like the treatment that she received was helpful and is looking forward to going back.  [facility] and [facility] to go to [facility] weight management clinic.  She is frustrated that she has not been able to lose any significant weight.  She has gained about 20 pounds since treatment.  She was diagnosed with sleep apnea and is using a CPAP which is helped her sleep.  Because she is now sleeping up to 7 to 8 hours a night and not getting up in the middle  the night, she has had an increase in urinary incontinence episodes at night.  She continues to have urinary urgency as well as stress urinary incontinence during the day.  She endorses a good appetite without nausea or emesis.  She reports regular bowel function with intermittent diarrhea.  Past Medical/Surgical History: Past Medical History:  Diagnosis Date   Anemia    Anxiety    Bartholin cyst     Cervical cancer ([place])    Constipation    Depression    Deviated septum    Diabetes mellitus without complication ([place])    Difficult intravenous access    used port for 05-09-2019 surgery   Fibroids    History of blood transfusion    2 units given 04-26-2019, has had total of 8 units since jan 2021   History of blood transfusion 1 unit given 05-30-19   iv fluids also given   History of kidney problems    History of radiation therapy 03/23/2019-05/04/2019   Cervical IMRT   Dr Gery Pray   History of radiation therapy 05/09/2019-06/05/2019   vaginal brachytherapy   Dr Gery Pray   History of recent blood transfusion 05/16/2019   2 units given  per dr Pollyann Savoy at cancer center   Hypertension    Neuropathy    both thumbs   Obesity    Palpitations    PONV (postoperative nausea and vomiting)    Prediabetes    Preeclampsia 2006   Sleep apnea    no cpap used insurance would not cover, osa severe per pt   SOB (shortness of breath)    Swallowing difficulty     Past Surgical History:  Procedure Laterality Date   CESAREAN SECTION WITH BILATERAL TUBAL LIGATION  11/10/2004       DILATION AND CURETTAGE OF UTERUS  1997   IR IMAGING GUIDED PORT INSERTION  04/04/2019   OPERATIVE ULTRASOUND N/A 05/09/2019   Procedure: OPERATIVE ULTRASOUND;  Surgeon: Gery Pray, MD;  Location: [place];  Service: Urology;  Laterality: N/A;   OPERATIVE ULTRASOUND N/A 05/15/2019   Procedure: OPERATIVE ULTRASOUND;  Surgeon: Gery Pray, MD;  Location: [facility];  Service: Urology;  Laterality: N/A;   OPERATIVE ULTRASOUND N/A 05/22/2019   Procedure: OPERATIVE ULTRASOUND;  Surgeon: Gery Pray, MD;  Location: [facility];  Service: Urology;  Laterality: N/A;   OPERATIVE ULTRASOUND N/A 05/29/2019   Procedure: OPERATIVE ULTRASOUND;  Surgeon: Gery Pray, MD;  Location: [facility];  Service: Urology;  Laterality: N/A;   OPERATIVE ULTRASOUND N/A 06/05/2019    Procedure: OPERATIVE ULTRASOUND;  Surgeon: Gery Pray, MD;  Location: [facility];  Service: Urology;  Laterality: N/A;   TANDEM RING INSERTION N/A 05/09/2019   Procedure: TANDEM RING INSERTION;  Surgeon: Gery Pray, MD;  Location: [facility];  Service: Urology;  Laterality: N/A;   TANDEM RING INSERTION N/A 05/15/2019   Procedure: TANDEM RING INSERTION;  Surgeon: Gery Pray, MD;  Location: [facility];  Service: Urology;  Laterality: N/A;   TANDEM RING INSERTION N/A 05/22/2019   Procedure: TANDEM RING INSERTION;  Surgeon: Gery Pray, MD;  Location: [facility];  Service: Urology;  Laterality: N/A;   TANDEM RING INSERTION N/A 05/29/2019   Procedure: TANDEM RING INSERTION;  Surgeon: Gery Pray, MD;  Location: [facility];  Service: Urology;  Laterality: N/A;   TANDEM RING INSERTION N/A 06/05/2019   Procedure: TANDEM RING INSERTION;  Surgeon: Gery Pray, MD;  Location: [place];  Service: Urology;  Laterality: N/A;    Family History  Problem Relation Age of Onset   Brain cancer Mother        lung cancer to brain   Hypertension Mother    Cancer Mother    Depression Mother    Anxiety disorder Mother    Diabetes Father    Kidney disease Father    Cancer Father        kidney cancer   Heart failure Sister     Social History   Socioeconomic History   Marital status: Single    Spouse name: Not on file   Number of children: 4   Years of education: Not on file   Highest education level: Not on file  Occupational History   Not on file  Tobacco Use   Smoking status: Never   Smokeless tobacco: Never  Vaping Use   Vaping Use: Never used  Substance and Sexual Activity   Alcohol use: No   Drug use: No   Sexual activity: Not Currently    Birth control/protection: Surgical  Other Topics Concern   Not on file  Social History Narrative   Lives with her boyfriend   Social  Determinants of Health   Financial Resource Strain: Not on file  Food Insecurity: Not on file  Transportation Needs: Not on file  Physical Activity: Not on file  Stress: Not on file  Social Connections: Not on file    Current Medications:  Current Outpatient Medications:    amLODipine-valsartan (EXFORGE) 10-320 MG tablet, Take 1 tablet by mouth daily., Disp: , Rfl:    ASHWAGANDHA PO, Take by mouth at bedtime. Blue Goli Gummies, Disp: , Rfl:    Cholecalciferol (VITAMIN D3) 50 MCG (2000 UT) TABS, Take 2,000 Units by mouth daily., Disp: , Rfl:    CLONIDINE HCL PO, Take 0.1 mg by mouth in the morning and at bedtime. Pending BP readings, Disp: , Rfl:    fluticasone (FLONASE) 50 MCG/ACT nasal spray, Place 1 spray into both nostrils daily as needed for allergies or rhinitis., Disp: , Rfl:    Insulin Pen Needle (BD PEN NEEDLE NANO 2ND GEN) 32G X 4 MM MISC, 1 Package by Does not apply route 2 (two) times daily. (Patient taking differently: 1 Package by Does not apply route daily after supper.), Disp: 100 each, Rfl: 0   liraglutide (VICTOZA) 18 MG/3ML SOPN, Inject 1.8 mg into the skin daily., Disp: 9 mL, Rfl: 0   mupirocin ointment (BACTROBAN) 2 %, 1 application 3 (three) times daily., Disp: , Rfl:   Review of Systems: Pertinent positives include diarrhea, pain with intercourse, anxiety, depression. Denies appetite changes, fevers, chills, fatigue, unexplained weight changes. Denies hearing loss, neck lumps or masses, mouth sores, ringing in ears or voice changes. Denies cough or wheezing.  Denies shortness of breath. Denies chest pain or palpitations. Denies leg swelling. Denies abdominal distention, pain, blood in stools, constipation, nausea, vomiting, or early satiety. Denies dysuria, frequency, hematuria or incontinence. Denies hot flashes, pelvic pain, vaginal bleeding or vaginal discharge.   Denies joint pain, back pain or muscle pain/cramps. Denies itching, rash, or wounds. Denies  dizziness, headaches, numbness or seizures. Denies swollen lymph nodes or glands, denies easy bruising or bleeding. Denies confusion or decreased concentration.  Physical Exam: BP (!) 164/92 (BP Location: Left Wrist, Patient Position: Sitting)   Pulse (!) 110   Temp 98 F (36.7 C) (Oral)   Resp 18   Ht 5'  2" (1.575 m)   Wt 230 lb 6.4 oz (104.5 kg)   SpO2 100%   BMI 42.14 kg/m  General: Alert, oriented, no acute distress. HEENT: Normocephalic, atraumatic, sclera anicteric. Chest: Unlabored breathing on room air. Cardiovascular: Regular rate and rhythm, no murmurs. Abdomen: Obese, soft, nontender.  Normoactive bowel sounds.  No masses or hepatosplenomegaly appreciated.   Extremities: Grossly normal range of motion.  Warm, well perfused.  No edema bilaterally. Skin: No rashes or lesions noted. Lymphatics: No cervical, supraclavicular, or inguinal adenopathy. GU: Normal appearing external genitalia without erythema, excoriation, or lesions.  Speculum exam reveals moderately atrophic vaginal mucosas with radiation changes.  No lesions or masses.  No atypical vasculature.  No bleeding or discharge.  Bimanual exam reveals no nodularity or masses.  Rectovaginal exam confirms findings.  Laboratory & Radiologic Studies: ASCUS pap, HPV negative  Assessment & Plan: Tamara Osborne is a 45 y.o. woman  with Stage IIB SCC who presents for follow-up after Pap test last month during her surveillance visit.  Reassured patient that overall her exam remains normal and I do not see any evidence concerning for recurrent disease.  We will continue to follow her with exams every 3 months and plan to repeat a Pap test in 1 year.  Patient is looking forward to continuing pelvic floor physical therapy, which I hope can help with her discomfort with both vaginal dilator use as well as intercourse.  We have previously talked about potential referral to urology given her urinary symptoms.  The patient will  think about this and let us know if she would like a referral to be placed.  The patient has made a number of changes including her diet and increasing her physical activity.  I commended her on these changes and encouraged her that hopefully as she continues to increase her physical activity she will see some positive changes with regards to her weight.  We will continue with surveillance visits every 3 months.  She sees Dr. Sondra Come and in approximately 2 months and will see me back in 6.  We reviewed signs and symptoms that would be concerning for disease recurrence and she knows to call if she develops any of these before her next scheduled visit.  38 minutes of total time was spent for this patient encounter, including preparation, face-to-face counseling with the patient and coordination of care, and documentation of the encounter.  Jeral Pinch, MD  Division of Gynecologic Oncology  Department of Obstetrics and Gynecology  [facility]

## 2020-10-17 ENCOUNTER — Encounter: Payer: Self-pay | Admitting: Hematology and Oncology

## 2020-10-18 ENCOUNTER — Other Ambulatory Visit: Payer: Self-pay | Admitting: *Deleted

## 2020-10-18 ENCOUNTER — Other Ambulatory Visit: Payer: Self-pay

## 2020-10-18 ENCOUNTER — Inpatient Hospital Stay: Payer: Medicaid Other | Attending: Gynecologic Oncology

## 2020-10-18 ENCOUNTER — Inpatient Hospital Stay (HOSPITAL_BASED_OUTPATIENT_CLINIC_OR_DEPARTMENT_OTHER): Payer: Medicaid Other | Admitting: Hematology and Oncology

## 2020-10-18 ENCOUNTER — Encounter: Payer: Self-pay | Admitting: Hematology and Oncology

## 2020-10-18 VITALS — BP 136/80 | HR 101 | Temp 98.2°F | Resp 18 | Ht 62.0 in | Wt 230.0 lb

## 2020-10-18 DIAGNOSIS — R7303 Prediabetes: Secondary | ICD-10-CM

## 2020-10-18 DIAGNOSIS — C539 Malignant neoplasm of cervix uteri, unspecified: Secondary | ICD-10-CM | POA: Insufficient documentation

## 2020-10-18 DIAGNOSIS — R32 Unspecified urinary incontinence: Secondary | ICD-10-CM | POA: Diagnosis not present

## 2020-10-18 DIAGNOSIS — Z923 Personal history of irradiation: Secondary | ICD-10-CM | POA: Diagnosis not present

## 2020-10-18 DIAGNOSIS — Z79899 Other long term (current) drug therapy: Secondary | ICD-10-CM | POA: Insufficient documentation

## 2020-10-18 DIAGNOSIS — T451X5A Adverse effect of antineoplastic and immunosuppressive drugs, initial encounter: Secondary | ICD-10-CM

## 2020-10-18 DIAGNOSIS — Z95828 Presence of other vascular implants and grafts: Secondary | ICD-10-CM

## 2020-10-18 DIAGNOSIS — E559 Vitamin D deficiency, unspecified: Secondary | ICD-10-CM

## 2020-10-18 DIAGNOSIS — Z6841 Body Mass Index (BMI) 40.0 and over, adult: Secondary | ICD-10-CM

## 2020-10-18 DIAGNOSIS — G620 Drug-induced polyneuropathy: Secondary | ICD-10-CM | POA: Diagnosis not present

## 2020-10-18 DIAGNOSIS — E8881 Metabolic syndrome: Secondary | ICD-10-CM

## 2020-10-18 LAB — CMP (CANCER CENTER ONLY)
ALT: 79 U/L — ABNORMAL HIGH (ref 0–44)
AST: 60 U/L — ABNORMAL HIGH (ref 15–41)
Albumin: 3.9 g/dL (ref 3.5–5.0)
Alkaline Phosphatase: 111 U/L (ref 38–126)
Anion gap: 12 (ref 5–15)
BUN: 20 mg/dL (ref 6–20)
CO2: 23 mmol/L (ref 22–32)
Calcium: 9.9 mg/dL (ref 8.9–10.3)
Chloride: 104 mmol/L (ref 98–111)
Creatinine: 1.48 mg/dL — ABNORMAL HIGH (ref 0.44–1.00)
GFR, Estimated: 45 mL/min — ABNORMAL LOW (ref >60)
Glucose, Bld: 105 mg/dL — ABNORMAL HIGH (ref 70–99)
Potassium: 4.2 mmol/L (ref 3.5–5.1)
Sodium: 139 mmol/L (ref 135–145)
Total Bilirubin: 0.5 mg/dL (ref 0.3–1.2)
Total Protein: 7.9 g/dL (ref 6.5–8.1)

## 2020-10-18 LAB — URINALYSIS, COMPLETE (UACMP) WITH MICROSCOPIC
Bilirubin Urine: NEGATIVE
Glucose, UA: NEGATIVE mg/dL
Ketones, ur: NEGATIVE mg/dL
Nitrite: NEGATIVE
Protein, ur: 100 mg/dL — AB
Specific Gravity, Urine: 1.008 (ref 1.005–1.030)
pH: 5 (ref 5.0–8.0)

## 2020-10-18 LAB — VITAMIN D 25 HYDROXY (VIT D DEFICIENCY, FRACTURES): Vit D, 25-Hydroxy: 59.37 ng/mL (ref 30–100)

## 2020-10-18 LAB — HEMOGLOBIN A1C
Hgb A1c MFr Bld: 7.5 % — ABNORMAL HIGH (ref 4.8–5.6)
Mean Plasma Glucose: 168.55 mg/dL

## 2020-10-18 MED ORDER — HEPARIN SOD (PORK) LOCK FLUSH 100 UNIT/ML IV SOLN
500.0000 [IU] | Freq: Once | INTRAVENOUS | Status: AC
Start: 2020-10-18 — End: 2020-10-18
  Administered 2020-10-18: 500 [IU]

## 2020-10-18 MED ORDER — SODIUM CHLORIDE 0.9% FLUSH
10.0000 mL | Freq: Once | INTRAVENOUS | Status: AC
  Administered 2020-10-18: 10 mL

## 2020-10-18 NOTE — Assessment & Plan Note (Signed)
Neuropathy has improved dramatically since last time I saw her Observe for now

## 2020-10-18 NOTE — Assessment & Plan Note (Signed)
She has been referred to see physical therapist for rehab I recommend she keeps her appointment I will order urinalysis and urine culture to rule out UTI

## 2020-10-18 NOTE — Assessment & Plan Note (Signed)
She has completed concurrent chemoradiation therapy PET CT scan showed complete response We are maintaining her port patency because of poor venous access I will see her again in the next year I am hoping we can remove her port next year

## 2020-10-18 NOTE — Progress Notes (Signed)
[employer] OFFICE PROGRESS NOTE  Patient Care Team: Ginger Organ as PCP - General (Physician Assistant)  ASSESSMENT & PLAN:  Squamous cell carcinoma of cervix [facility]) She has completed concurrent chemoradiation therapy PET CT scan showed complete response We are maintaining her port patency because of poor venous access I will see her again in the next year I am hoping we can remove her port next year  Incontinence of urine in female She has been referred to see physical therapist for rehab I recommend she keeps her appointment I will order urinalysis and urine culture to rule out UTI  Peripheral neuropathy due to chemotherapy ([place]) Neuropathy has improved dramatically since last time I saw her Observe for now  Orders Placed This Encounter  Procedures   Urine Culture    Standing Status:   Future    Number of Occurrences:   1    Standing Expiration Date:   10/18/2021   Urinalysis, Complete w Microscopic    Standing Status:   Future    Number of Occurrences:   1    Standing Expiration Date:   10/18/2021    All questions were answered. The patient knows to call the clinic with any problems, questions or concerns. The total time spent in the appointment was 20 minutes encounter with patients including review of chart and various tests results, discussions about plan of care and coordination of care plan   [name], MD 10/18/2020 4:33 PM  INTERVAL HISTORY: Please see below for problem oriented charting. She returns for further follow-up She denies vaginal discharge or bleeding She has some occasional pressure in her bladder and have some incontinence She denies recent hematuria or dysuria She has very slight peripheral neuropathy She is working hard to try to lose weight She slept much better when she was prescribed CPAP machine for newly diagnosed obstructive sleep apnea  SUMMARY OF ONCOLOGIC HISTORY: Oncology History  Squamous cell carcinoma of  cervix ([place])  03/21/2019 Pathology Results   The biopsy is superficial and therefore depth of invasion cannot be  determined.  There is at least carcinoma in-situ.    03/23/2019 Initial Diagnosis   Squamous cell carcinoma of cervix ([place])   03/23/2019 Pathology Results   CERVIX, 11 O CLOCK, BIOPSY:  -  Squamous cell carcinoma, invasive  -  See comment   03/23/2019 - 06/05/2019 Radiation Therapy   External beam therapy was from 03/23/2019 - 05/04/2019. HDR vaginal brachytherapy was from 05/19/2019 - 06/05/2019.   Site Technique Total Dose (Gy) Dose per Fx (Gy) Completed Fx Beam Energies  Cervix: Cervix HDR-brachy 5.5/5.5 5.5 1/1 Ir-192  Cervix: Cervix HDR-brachy 5.5/5.5 5.5 1/1 Ir-192  Cervix: Cervix_Bst HDR-brachy 5.5/5.5 5.5 1/1 Ir-192  Cervix: Cervix HDR-brachy 5.5/5.5 5.5 1/1 Ir-192  Cervix: Cervix 3D 45/45 1.8 25/25 10X, 15X  Cervix: Cervix HDR-brachy 5.5/5.5 5.5 1/1 Ir-192  Cervix: Cervix_Bst 3D 9/9 1.8 5/5 15X      03/28/2019 PET scan   1. Hypermetabolic cervical mass measures roughly 7.6 by 6.9 by 4.9 cm, compatible with malignancy. No adenopathy or distant metastatic spread identified. 2. Extending cephalad and anterior to the uterine fundus, there is a 450 cubic cm simple fluid density structure without hypermetabolic activity. This may represent a right adnexal cyst (favored), peritoneal inclusion cyst, or (if the patient has a remote history of pancreatitis) a remote pseudocyst. 3. Faint ground density nodularity in both lower lobes which could be from atypical infection or extrinsic allergic alveolitis. Given nodular  appearance of some of this ground-glass density, I would recommend follow up chest CT in 3 months time in order to ensure clearance. 4. Moderate cardiomegaly, cause uncertain. The patient also has advanced for age/gender atherosclerosis. Aortic Atherosclerosis (ICD10-I70.0).     04/04/2019 Procedure   Successful placement of a right internal jugular approach power  injectable Port-A-Cath. The catheter is ready for immediate use   04/07/2019 - 05/12/2019 Chemotherapy   The patient had weekly cisplatin for chemotherapy treatment.     09/06/2019 PET scan   1. No evidence residual hypermetabolic cervical tissue. 2. No evidence of metastatic cervical carcinoma. 3. Large benign cystic mass unchanged in the upper pelvis.         REVIEW OF SYSTEMS:   Constitutional: Denies fevers, chills or abnormal weight loss Eyes: Denies blurriness of vision Ears, nose, mouth, throat, and face: Denies mucositis or sore throat Respiratory: Denies cough, dyspnea or wheezes Cardiovascular: Denies palpitation, chest discomfort or lower extremity swelling Gastrointestinal:  Denies nausea, heartburn or change in bowel habits Skin: Denies abnormal skin rashes Lymphatics: Denies new lymphadenopathy or easy bruising Neurological:Denies numbness, tingling or new weaknesses Behavioral/Psych: Mood is stable, no new changes  All other systems were reviewed with the patient and are negative.  I have reviewed the past medical history, past surgical history, social history and family history with the patient and they are unchanged from previous note.  ALLERGIES:  is allergic to penicillins, shellfish allergy, and sulfa antibiotics.  MEDICATIONS:  Current Outpatient Medications  Medication Sig Dispense Refill   amLODipine-valsartan (EXFORGE) 10-320 MG tablet Take 1 tablet by mouth daily.     ASHWAGANDHA PO Take by mouth at bedtime. Blue Goli Gummies     Cholecalciferol (VITAMIN D3) 50 MCG (2000 UT) TABS Take 2,000 Units by mouth daily.     CLONIDINE HCL PO Take 0.1 mg by mouth in the morning and at bedtime. Pending BP readings     fluticasone (FLONASE) 50 MCG/ACT nasal spray Place 1 spray into both nostrils daily as needed for allergies or rhinitis.     Insulin Pen Needle (BD PEN NEEDLE NANO 2ND GEN) 32G X 4 MM MISC 1 Package by Does not apply route 2 (two) times daily. (Patient  taking differently: 1 Package by Does not apply route daily after supper.) 100 each 0   liraglutide (VICTOZA) 18 MG/3ML SOPN Inject 1.8 mg into the skin daily. 9 mL 0   mupirocin ointment (BACTROBAN) 2 % 1 application 3 (three) times daily.     No current facility-administered medications for this visit.    PHYSICAL EXAMINATION: ECOG PERFORMANCE STATUS: 1 - Symptomatic but completely ambulatory  Vitals:   10/18/20 1307  BP: 136/80  Pulse: (!) 101  Resp: 18  Temp: 98.2 F (36.8 C)  SpO2: 98%   Filed Weights   10/18/20 1307  Weight: 230 lb (104.3 kg)    GENERAL:alert, no distress and comfortable NEURO: alert & oriented x 3 with fluent speech, no focal motor/sensory deficits  LABORATORY DATA:  I have reviewed the data as listed    Component Value Date/Time   NA 139 10/18/2020 1246   NA 140 09/20/2019 1106   K 4.2 10/18/2020 1246   CL 104 10/18/2020 1246   CO2 23 10/18/2020 1246   GLUCOSE 105 (H) 10/18/2020 1246   BUN 20 10/18/2020 1246   BUN 20 09/20/2019 1106   CREATININE 1.48 (H) 10/18/2020 1246   CALCIUM 9.9 10/18/2020 1246   PROT 7.9 10/18/2020 1246  PROT 7.9 09/20/2019 1106   ALBUMIN 3.9 10/18/2020 1246   ALBUMIN 4.5 09/20/2019 1106   AST 60 (H) 10/18/2020 1246   ALT 79 (H) 10/18/2020 1246   ALKPHOS 111 10/18/2020 1246   BILITOT 0.5 10/18/2020 1246   GFRNONAA 45 (L) 10/18/2020 1246   GFRAA 55 (L) 09/20/2019 1106    No results found for: SPEP, UPEP  Lab Results  Component Value Date   WBC 6.6 08/07/2019   NEUTROABS 4.7 08/07/2019   HGB 11.8 (L) 08/07/2019   HCT 34.8 (L) 08/07/2019   MCV 87.7 08/07/2019   PLT 267 08/07/2019      Chemistry      Component Value Date/Time   NA 139 10/18/2020 1246   NA 140 09/20/2019 1106   K 4.2 10/18/2020 1246   CL 104 10/18/2020 1246   CO2 23 10/18/2020 1246   BUN 20 10/18/2020 1246   BUN 20 09/20/2019 1106   CREATININE 1.48 (H) 10/18/2020 1246      Component Value Date/Time   CALCIUM 9.9 10/18/2020 1246    ALKPHOS 111 10/18/2020 1246   AST 60 (H) 10/18/2020 1246   ALT 79 (H) 10/18/2020 1246   BILITOT 0.5 10/18/2020 1246       RADIOGRAPHIC STUDIES: I have personally reviewed the radiological images as listed and agreed with the findings in the report. MM 3D SCREEN BREAST BILATERAL  Result Date: 09/21/2020 CLINICAL DATA:  Screening. Baseline. EXAM: DIGITAL SCREENING BILATERAL MAMMOGRAM WITH TOMOSYNTHESIS AND CAD TECHNIQUE: Bilateral screening digital craniocaudal and mediolateral oblique mammograms were obtained. Bilateral screening digital breast tomosynthesis was performed. The images were evaluated with computer-aided detection. COMPARISON:  None. ACR Breast Density Category b: There are scattered areas of fibroglandular density. FINDINGS: There are no findings suspicious for malignancy. IMPRESSION: No mammographic evidence of malignancy. A result letter of this screening mammogram will be mailed directly to the patient. RECOMMENDATION: Screening mammogram in one year. (Code:[id number]-B-01Y) BI-RADS CATEGORY  1: Negative. Electronically Signed   By: Franki Cabot M.D.   On: 09/21/2020 08:09

## 2020-10-19 LAB — URINE CULTURE: Culture: <10000 — AB

## 2020-10-19 LAB — INSULIN, RANDOM: Insulin: 55.9 u[IU]/mL — ABNORMAL HIGH (ref 2.6–24.9)

## 2020-10-21 ENCOUNTER — Telehealth: Payer: Self-pay

## 2020-10-21 NOTE — Telephone Encounter (Signed)
Called and given below message. She verbalized understanding. She will follow up with weight management to review labs tomorrow.

## 2020-10-21 NOTE — Telephone Encounter (Signed)
-----   Message from [name], MD sent at 10/21/2020  7:29 AM EDT ----- Urine culture not consistent with UTI Her bladder discomfort is unrelated

## 2020-10-22 ENCOUNTER — Encounter (INDEPENDENT_AMBULATORY_CARE_PROVIDER_SITE_OTHER): Payer: Self-pay | Admitting: Adult Health

## 2020-10-22 ENCOUNTER — Other Ambulatory Visit: Payer: Self-pay

## 2020-10-22 ENCOUNTER — Ambulatory Visit (INDEPENDENT_AMBULATORY_CARE_PROVIDER_SITE_OTHER): Payer: Medicaid Other | Admitting: Adult Health

## 2020-10-22 VITALS — BP 137/85 | HR 90 | Temp 97.5°F | Ht 62.0 in | Wt 225.0 lb

## 2020-10-22 DIAGNOSIS — K061 Gingival enlargement: Secondary | ICD-10-CM

## 2020-10-22 DIAGNOSIS — E1159 Type 2 diabetes mellitus with other circulatory complications: Secondary | ICD-10-CM | POA: Diagnosis not present

## 2020-10-22 DIAGNOSIS — E119 Type 2 diabetes mellitus without complications: Secondary | ICD-10-CM

## 2020-10-22 DIAGNOSIS — N183 Chronic kidney disease, stage 3 unspecified: Secondary | ICD-10-CM | POA: Diagnosis not present

## 2020-10-22 DIAGNOSIS — I10 Essential (primary) hypertension: Secondary | ICD-10-CM

## 2020-10-22 DIAGNOSIS — Z6841 Body Mass Index (BMI) 40.0 and over, adult: Secondary | ICD-10-CM

## 2020-10-22 DIAGNOSIS — E8881 Metabolic syndrome: Secondary | ICD-10-CM

## 2020-10-22 DIAGNOSIS — I152 Hypertension secondary to endocrine disorders: Secondary | ICD-10-CM

## 2020-10-22 MED ORDER — VICTOZA 18 MG/3ML ~~LOC~~ SOPN: 1.8000 mg | PEN_INJECTOR | Freq: Every day | SUBCUTANEOUS | 0 refills | Status: DC

## 2020-10-23 ENCOUNTER — Encounter (INDEPENDENT_AMBULATORY_CARE_PROVIDER_SITE_OTHER): Payer: Self-pay | Admitting: Primary Care

## 2020-10-23 ENCOUNTER — Ambulatory Visit (INDEPENDENT_AMBULATORY_CARE_PROVIDER_SITE_OTHER): Payer: Medicaid Other | Admitting: Primary Care

## 2020-10-23 VITALS — BP 137/88 | HR 99 | Temp 97.5°F | Ht 62.0 in | Wt 229.4 lb

## 2020-10-23 DIAGNOSIS — E1169 Type 2 diabetes mellitus with other specified complication: Secondary | ICD-10-CM | POA: Diagnosis not present

## 2020-10-23 DIAGNOSIS — Z7689 Persons encountering health services in other specified circumstances: Secondary | ICD-10-CM

## 2020-10-23 DIAGNOSIS — Z6841 Body Mass Index (BMI) 40.0 and over, adult: Secondary | ICD-10-CM | POA: Diagnosis not present

## 2020-10-23 DIAGNOSIS — I10 Essential (primary) hypertension: Secondary | ICD-10-CM | POA: Diagnosis not present

## 2020-10-23 DIAGNOSIS — E119 Type 2 diabetes mellitus without complications: Secondary | ICD-10-CM

## 2020-10-23 MED ORDER — AMLODIPINE BESYLATE-VALSARTAN 10-320 MG PO TABS: 1.0000 | ORAL_TABLET | Freq: Every day | ORAL | 0 refills | Status: DC

## 2020-10-23 MED ORDER — METFORMIN HCL 1000 MG PO TABS: 1000.0000 mg | ORAL_TABLET | Freq: Two times a day (BID) | ORAL | 0 refills | Status: DC

## 2020-10-23 NOTE — Progress Notes (Signed)
Chief Complaint:   OBESITY Tamara Osborne is here to discuss her progress with her obesity treatment plan along with follow-up of her obesity related diagnoses. Tamara Osborne is on the Category 2 Plan and states she is following her eating plan approximately 70% of the time. Tamara Osborne states she is doing water aerobics for 60 minutes 3 times per week.  Today's visit was #: 22 Starting weight: 208 lbs Starting date: 09/20/2019 Today's weight: 225 lbs Today's date: 10/22/2020 Total lbs lost to date: 0 Total lbs lost since last in-office visit: 1 lb  Interim History: Tamara Osborne had a recent appointment with the cancer center.   Her port will remain in place and labs revealed A1c of 7.5 - new onset type 2 diabetes.  Subjective:   1. Hypertension associated with type 2 diabetes mellitus (HCC) BP was exceedingly elevated at last dental appointment.  PCP recently increased Exforge - now 10/320 mg daily.  PCP also provided short prescription of diazepam 5 mg to take prior to dental visits.  BP Readings from Last 3 Encounters:  10/22/20 137/85  10/18/20 136/80  10/16/20 (!) 164/92   2. CKD (chronic kidney disease), symptom management only, stage 3 (moderate) (HCC) On 10/18/2020, CMP showed GFR of 45 - stable.  Lab Results  Component Value Date   CREATININE 1.48 (H) 10/18/2020   CREATININE 1.33 (H) 05/20/2020   CREATININE 1.49 (H) 01/22/2020   Lab Results  Component Value Date   CREATININE 1.48 (H) 10/18/2020   BUN 20 10/18/2020   NA 139 10/18/2020   K 4.2 10/18/2020   CL 104 10/18/2020   CO2 23 10/18/2020   3. Gingival hyperplasia Per patient - the dentist asked her to follow-up with her PCP about amlodipine/valsartan 10/320 mg - amlodipine may cause the gingival hyperplasia.  4. New onset type 2 diabetes mellitus (HCC)She is on Victoza 1.8 mg - off 1 week during GI upset.  Currently denies GI upset.  Restarted.  Labs with cancer center - A1c 7.5.  CMP on 10/18/2020 showed GFR of  45.  Lab Results  Component Value Date   HGBA1C 7.5 (H) 10/18/2020   HGBA1C 6.0 (H) 05/20/2020   HGBA1C 5.2 10/30/2019   Lab Results  Component Value Date   LDLCALC 145 (H) 05/20/2020   CREATININE 1.48 (H) 10/18/2020   Lab Results  Component Value Date   INSULIN 40.3 (H) 09/20/2019   Assessment/Plan:   1. Hypertension associated with type 2 diabetes mellitus ([place]) Tamara Osborne is working on healthy weight loss and exercise to improve blood pressure control. We will watch for signs of hypotension as she continues her lifestyle modifications.  Follow-up with PCP.  2. CKD (chronic kidney disease), symptom management only, stage 3 (moderate) (HCC) Lab results and trends reviewed. We discussed several lifestyle modifications today and she will continue to work on diet, exercise and weight loss efforts. Avoid nephrotoxic medications.  Follow-up with primary care provider.  Avoid NSAIDs!  Counseling Chronic kidney disease (CKD) happens when the kidneys are damaged over a long period of time. Most of the time, this condition does not go away, but it can usually be controlled. Steps must be taken to slow down the kidney damage or to stop it from getting worse. Intensive lifestyle modifications are the first line treatment for this issue.  Avoid buying foods that are: processed, frozen, or prepackaged to avoid excess salt.  3. Gingival hyperplasia Follow-up with PCP about hypertension management in regards to [facility].  4. New onset type  2 diabetes mellitus ([place]) Good blood sugar control is important to decrease the likelihood of diabetic complications such as nephropathy, neuropathy, limb loss, blindness, coronary artery disease, and death. Intensive lifestyle modification including diet, exercise and weight loss are the first line of treatment for diabetes.  Continue Victoza 1.8 mg daily.  Will refill today.  Follow-up with PCP.  - Refill liraglutide (VICTOZA) 18 MG/3ML SOPN; Inject 1.8 mg into  the skin daily.  Dispense: 9 mL; Refill: 0  5. Class 3 severe obesity with serious comorbidity and body mass index (BMI) of 40.0 to 44.9 in adult, unspecified obesity type (HCC)  Tamara Osborne is currently in the action stage of change. As such, her goal is to continue with weight loss efforts. She has agreed to the Category 2 Plan.   Exercise goals:  As is.  Behavioral modification strategies: increasing lean protein intake, decreasing simple carbohydrates, meal planning and cooking strategies, keeping healthy foods in the home, and planning for success.  Follow-up with PCP about antihypertensive therapy and new onset type 2 diabetes.  Tamara Osborne has agreed to follow-up with our clinic in 3-4 weeks. She was informed of the importance of frequent follow-up visits to maximize her success with intensive lifestyle modifications for her multiple health conditions.   Objective:   Blood pressure 137/85, pulse 90, temperature (!) 97.5 F (36.4 C), height '5\' 2"'$  (1.575 m), weight 225 lb (102.1 kg), SpO2 97 %. Body mass index is 41.15 kg/m.  General: Cooperative, alert, well developed, in no acute distress. HEENT: Conjunctivae and lids unremarkable. Cardiovascular: Regular rhythm.  Lungs: Normal work of breathing. Neurologic: No focal deficits.   Lab Results  Component Value Date   CREATININE 1.48 (H) 10/18/2020   BUN 20 10/18/2020   NA 139 10/18/2020   K 4.2 10/18/2020   CL 104 10/18/2020   CO2 23 10/18/2020   Lab Results  Component Value Date   ALT 79 (H) 10/18/2020   AST 60 (H) 10/18/2020   ALKPHOS 111 10/18/2020   BILITOT 0.5 10/18/2020   Lab Results  Component Value Date   HGBA1C 7.5 (H) 10/18/2020   HGBA1C 6.0 (H) 05/20/2020   HGBA1C 5.2 10/30/2019   HGBA1C CANCELED 09/20/2019   Lab Results  Component Value Date   INSULIN 40.3 (H) 09/20/2019   Lab Results  Component Value Date   TSH 1.910 09/20/2019   Lab Results  Component Value Date   CHOL 214 (H) 05/20/2020   HDL  32 (L) 05/20/2020   LDLCALC 145 (H) 05/20/2020   TRIG 184 (H) 05/20/2020   CHOLHDL 6.7 05/20/2020   Lab Results  Component Value Date   VD25OH 59.37 10/18/2020   VD25OH 45.60 05/20/2020   VD25OH 69.37 01/22/2020   Lab Results  Component Value Date   WBC 6.6 08/07/2019   HGB 11.8 (L) 08/07/2019   HCT 34.8 (L) 08/07/2019   MCV 87.7 08/07/2019   PLT 267 08/07/2019   Lab Results  Component Value Date   IRON 17 (L) 03/27/2019   TIBC 283 03/27/2019   FERRITIN 32 03/27/2019   Attestation Statements:   Reviewed by clinician on day of visit: allergies, medications, problem list, medical history, surgical history, family history, social history, and previous encounter notes.  Time spent on visit including pre-visit chart review and post-visit care and charting was 45 minutes.   I, Water quality scientist, CMA, am acting as Location manager for Mina Marble, NP.  I have reviewed the above documentation for accuracy and completeness, and I agree with  the above. -  Labaron Digirolamo d. Anushka Hartinger, NP-C

## 2020-10-23 NOTE — Progress Notes (Signed)
Established Patient Office Visit  Subjective:  Patient ID: Tamara Osborne, female    DOB: [birth year]  Age: 45 y.o. MRN: [id number]  CC:  Chief Complaint  Patient presents with   New Patient (Initial Visit)    HPI Tamara Osborne presents for establishment of care followed by weight management which informed her to find a PCP in network. She is very confused about now a diabetic on Victozia gaining weight instead of loosing . Told not to take metformin it would ruin her kidneys hesitant when said that what I was adding. Reviewed data with her about when to be cautious and explaining GFR. Also , advised to read the insert which comes with any medication.  Past Medical History:  Diagnosis Date   Anemia    Anxiety    Bartholin cyst    Cervical cancer ([place])    Constipation    Depression    Deviated septum    Diabetes mellitus without complication ([place])    Difficult intravenous access    used port for 05-09-2019 surgery   Fibroids    History of blood transfusion    2 units given 04-26-2019, has had total of 8 units since jan 2021   History of blood transfusion 1 unit given 05-30-19   iv fluids also given   History of kidney problems    History of radiation therapy 03/23/2019-05/04/2019   Cervical IMRT   Dr Gery Pray   History of radiation therapy 05/09/2019-06/05/2019   vaginal brachytherapy   Dr Gery Pray   History of recent blood transfusion 05/16/2019   2 units given  per dr Pollyann Savoy at cancer center   Hypertension    Neuropathy    both thumbs   Obesity    Palpitations    PONV (postoperative nausea and vomiting)    Prediabetes    Preeclampsia 2006   Sleep apnea    no cpap used insurance would not cover, osa severe per pt   SOB (shortness of breath)    Swallowing difficulty     Past Surgical History:  Procedure Laterality Date   CESAREAN SECTION WITH BILATERAL TUBAL LIGATION  11/10/2004       DILATION AND CURETTAGE OF UTERUS  1997   IR IMAGING GUIDED PORT  INSERTION  04/04/2019   OPERATIVE ULTRASOUND N/A 05/09/2019   Procedure: OPERATIVE ULTRASOUND;  Surgeon: Gery Pray, MD;  Location: [facility];  Service: Urology;  Laterality: N/A;   OPERATIVE ULTRASOUND N/A 05/15/2019   Procedure: OPERATIVE ULTRASOUND;  Surgeon: Gery Pray, MD;  Location: [facility];  Service: Urology;  Laterality: N/A;   OPERATIVE ULTRASOUND N/A 05/22/2019   Procedure: OPERATIVE ULTRASOUND;  Surgeon: Gery Pray, MD;  Location[facility];  Service: Urology;  Laterality: N/A;   OPERATIVE ULTRASOUND N/A 05/29/2019   Procedure: OPERATIVE ULTRASOUND;  Surgeon: Gery Pray, MD;  Location: [facility];  Service: Urology;  Laterality: N/A;   OPERATIVE ULTRASOUND N/A 06/05/2019   Procedure: OPERATIVE ULTRASOUND;  Surgeon: Gery Pray, MD;  Location: [facility];  Service: Urology;  Laterality: N/A;   TANDEM RING INSERTION N/A 05/09/2019   Procedure: TANDEM RING INSERTION;  Surgeon: Gery Pray, MD;  Location: [facility];  Service: Urology;  Laterality: N/A;   TANDEM RING INSERTION N/A 05/15/2019   Procedure: TANDEM RING INSERTION;  Surgeon: Gery Pray, MD;  Location: [facility];  Service: Urology;  Laterality: N/A;   TANDEM RING INSERTION N/A 05/22/2019  Procedure: TANDEM RING INSERTION;  Surgeon: Gery Pray, MD;  Location[facility];  Service: Urology;  Laterality: N/A;   TANDEM RING INSERTION N/A 05/29/2019   Procedure: TANDEM RING INSERTION;  Surgeon: Gery Pray, MD;  Location: [facility];  Service: Urology;  Laterality: N/A;   TANDEM RING INSERTION N/A 06/05/2019   Procedure: TANDEM RING INSERTION;  Surgeon: Gery Pray, MD;  Location: [facility];  Service: Urology;  Laterality: N/A;    Family History  Problem Relation Age of Onset   Brain cancer Mother        lung cancer to brain   Hypertension Mother     Cancer Mother    Depression Mother    Anxiety disorder Mother    Diabetes Father    Kidney disease Father    Cancer Father        kidney cancer   Heart failure Sister     Social History   Socioeconomic History   Marital status: Single    Spouse name: Not on file   Number of children: 4   Years of education: Not on file   Highest education level: Not on file  Occupational History   Not on file  Tobacco Use   Smoking status: Never   Smokeless tobacco: Never  Vaping Use   Vaping Use: Never used  Substance and Sexual Activity   Alcohol use: No   Drug use: No   Sexual activity: Not Currently    Birth control/protection: Surgical  Other Topics Concern   Not on file  Social History Narrative   Lives with her boyfriend   Social Determinants of Health   Financial Resource Strain: Not on file  Food Insecurity: Not on file  Transportation Needs: Not on file  Physical Activity: Not on file  Stress: Not on file  Social Connections: Not on file  Intimate Partner Violence: Not on file    Outpatient Medications Prior to Visit  Medication Sig Dispense Refill   ASHWAGANDHA PO Take by mouth at bedtime. Blue [place] Gummies     Cholecalciferol (VITAMIN D3) 50 MCG (2000 UT) TABS Take 2,000 Units by mouth daily.     liraglutide (VICTOZA) 18 MG/3ML SOPN Inject 1.8 mg into the skin daily. 9 mL 0   amLODipine-valsartan (EXFORGE) 10-320 MG tablet Take 1 tablet by mouth daily.     CLONIDINE HCL PO Take 0.1 mg by mouth in the morning and at bedtime. Pending BP readings     fluticasone (FLONASE) 50 MCG/ACT nasal spray Place 1 spray into both nostrils daily as needed for allergies or rhinitis.     Insulin Pen Needle (BD PEN NEEDLE NANO 2ND GEN) 32G X 4 MM MISC 1 Package by Does not apply route 2 (two) times daily. (Patient taking differently: 1 Package by Does not apply route daily after supper.) 100 each 0   mupirocin ointment (BACTROBAN) 2 % 1 application 3 (three) times daily.     No  facility-administered medications prior to visit.    Allergies  Allergen Reactions   Penicillins     Not sure a child allergy   Shellfish Allergy Swelling    Seafood also any kind   Sulfa Antibiotics     Not sure a child allergy    ROS Review of Systems  Constitutional:  Positive for unexpected weight change.       Weight gain despite exercise and diet change  All other systems reviewed and are negative.  Objective:    Physical Exam Vitals:   10/23/20 0854 10/23/20 0910  BP: (!) 160/96 137/88  Pulse: 95 99  Temp: (!) 97.5 F (36.4 C)   TempSrc: Temporal   SpO2: 96%   Weight: 229 lb 6.4 oz (104.1 kg)   Height: '5\' 2"'$  (1.575 m)    General: Vital signs reviewed.  Patient is well-developed and well-nourished,morbid obesefemale in no acute distress and cooperative with exam.  Head: Normocephalic and atraumatic. Eyes: EOMI, conjunctivae normal, no scleral icterus.  Neck: Supple, trachea midline, normal ROM, no JVD, masses, thyromegaly, or carotid bruit present.  Cardiovascular: RRR, S1 normal, S2 normal, no murmurs, gallops, or rubs. Pulmonary/Chest: Clear to auscultation bilaterally, no wheezes, rales, or rhonchi. Abdominal: Soft, non-tender, non-distended, BS +, no masses, organomegaly, or guarding present.  Musculoskeletal: No joint deformities, erythema, or stiffness, ROM full and nontender. Extremities: No lower extremity edema bilaterally,  pulses symmetric and intact bilaterally. No cyanosis or clubbing. Neurological: A&O x3, Strength is normal and symmetric bilaterally, no focal motor deficit, sensory intact to light touch bilaterally.  Skin: Warm, dry and intact. No rashes or erythema. Psychiatric: Normal mood and affect. speech and behavior is normal. Cognition and memory are normal.   BP 137/88 (BP Location: Right Arm, Patient Position: Sitting, Cuff Size: Large)   Pulse 99   Temp (!) 97.5 F (36.4 C) (Temporal)   Ht '5\' 2"'$  (1.575 m)   Wt 229 lb 6.4 oz (104.1  kg)   SpO2 96%   BMI 41.96 kg/m  Wt Readings from Last 3 Encounters:  10/23/20 229 lb 6.4 oz (104.1 kg)  10/22/20 225 lb (102.1 kg)  10/18/20 230 lb (104.3 kg)     Health Maintenance Due  Topic Date Due   COVID-19 Vaccine (1) Never done   Pneumococcal Vaccine 54-48 Years old (1 - PCV) Never done   HIV Screening  Never done   INFLUENZA VACCINE  09/30/2020    There are no preventive care reminders to display for this patient.  Lab Results  Component Value Date   TSH 1.910 09/20/2019   Lab Results  Component Value Date   WBC 6.6 08/07/2019   HGB 11.8 (L) 08/07/2019   HCT 34.8 (L) 08/07/2019   MCV 87.7 08/07/2019   PLT 267 08/07/2019   Lab Results  Component Value Date   NA 139 10/18/2020   K 4.2 10/18/2020   CO2 23 10/18/2020   GLUCOSE 105 (H) 10/18/2020   BUN 20 10/18/2020   CREATININE 1.48 (H) 10/18/2020   BILITOT 0.5 10/18/2020   ALKPHOS 111 10/18/2020   AST 60 (H) 10/18/2020   ALT 79 (H) 10/18/2020   PROT 7.9 10/18/2020   ALBUMIN 3.9 10/18/2020   CALCIUM 9.9 10/18/2020   ANIONGAP 12 10/18/2020   Lab Results  Component Value Date   CHOL 214 (H) 05/20/2020   Lab Results  Component Value Date   HDL 32 (L) 05/20/2020   Lab Results  Component Value Date   LDLCALC 145 (H) 05/20/2020   Lab Results  Component Value Date   TRIG 184 (H) 05/20/2020   Lab Results  Component Value Date   CHOLHDL 6.7 05/20/2020   Lab Results  Component Value Date   HGBA1C 7.5 (H) 10/18/2020      Assessment & Plan:  Monta was seen today for new patient (initial visit).  Diagnoses and all orders for this visit:  Essential hypertension Counseled on blood pressure goal of less than 130/80, low-sodium, DASH diet, medication compliance,  150 minutes of moderate intensity exercise per week. Discussed medication compliance, adverse effects.  -     amLODipine-valsartan (EXFORGE) 10-320 MG tablet; Take 1 tablet by mouth daily.  Type 2 diabetes mellitus without  complication, without long-term current use of insulin (HCC) Goal of therapy: Less than 6.5 hemoglobin A1c.  Reference clinical practice recommendations. foods that are high in carbohydrates are the following rice, potatoes, breads, sugars, and pastas.  Reduction in the intake (eating) will assist in lowering your blood sugars.  Added - metFORMIN (GLUCOPHAGE) 1000 MG tablet; Take 1 tablet (1,000 mg total) by mouth 2 (two) times daily with a meal. Continue Victozia f/u with Lurena Joiner for clarity in DM and assist with management   Class 3 severe obesity with serious comorbidity and body mass index (BMI) of 40.0 to 44.9 in adult, unspecified obesity type ([place]) Followed by weight loss program frustrated not loosing weight but feels like she is gain. Advised to stop looking at the number on the scales and measure by how your clothes are fitting.  Encounter to establish care  Establishcare with PCP   Meds ordered this encounter  Medications   amLODipine-valsartan (EXFORGE) 10-320 MG tablet    Sig: Take 1 tablet by mouth daily.    Dispense:  90 tablet    Refill:  0   metFORMIN (GLUCOPHAGE) 1000 MG tablet    Sig: Take 1 tablet (1,000 mg total) by mouth 2 (two) times daily with a meal.    Dispense:  180 tablet    Refill:  0    Follow-up: Return in about 3 months (around 01/23/2021) for first available appt with  luke /PCP .    Kerin Perna, NP

## 2020-10-23 NOTE — Patient Instructions (Signed)
Metformin Tablets What is this medication? METFORMIN (met FOR min) treats type 2 diabetes. It controls blood sugar (glucose) and helps your body use insulin effectively. This medication is oftencombined with changes to diet and exercise. This medicine may be used for other purposes; ask your health care provider orpharmacist if you have questions. COMMON BRAND NAME(S): Glucophage What should I tell my care team before I take this medication? They need to know if you have any of these conditions: Anemia Dehydration Heart disease If you often drink alcohol Kidney disease Liver disease Polycystic ovary syndrome Serious infection or injury Vomiting An unusual or allergic reaction to metformin, other medications, foods, dyes, or preservatives Pregnant or trying to get pregnant Breast-feeding How should I use this medication? Take this medication by mouth with a glass of water. Follow the directions on the prescription label. Take this medication with food. Take your medication at regular intervals. Do not take your medication more often than directed. Do notstop taking except on your care team's advice. Talk to your care team about the use of this medication in children. While this medication may be prescribed for children as young as 65 years of age forselected conditions, precautions do apply. Overdosage: If you think you have taken too much of this medicine contact apoison control center or emergency room at once. NOTE: This medicine is only for you. Do not share this medicine with others. What if I miss a dose? If you miss a dose, take it as soon as you can. If it is almost time for yournext dose, take only that dose. Do not take double or extra doses. What may interact with this medication? Do not take this medication with any of the following: Certain contrast medications given before X-rays, CT scans, MRI, or other procedures Dofetilide This medication may also interact with the  following: Acetazolamide Alcohol Certain antivirals for HIV or hepatitis Certain medications for blood pressure, heart disease, irregular heart beat Cimetidine Dichlorphenamide Digoxin Diuretics Estrogens, progestins, or birth control pills Glycopyrrolate Isoniazid Lamotrigine Memantine Methazolamide Metoclopramide Midodrine Niacin Phenothiazines like chlorpromazine, mesoridazine, prochlorperazine, thioridazine Phenytoin Ranolazine Steroid medications like prednisone or cortisone Stimulant medications for attention disorders, weight loss, or to stay awake Thyroid medications Topiramate Trospium Vandetanib Zonisamide This list may not describe all possible interactions. Give your health care provider a list of all the medicines, herbs, non-prescription drugs, or dietary supplements you use. Also tell them if you smoke, drink alcohol, or use illegaldrugs. Some items may interact with your medicine. What should I watch for while using this medication? Visit your care team for regular checks on your progress. A test called the HbA1C (A1C) will be monitored. This is a simple blood test. It measures your blood sugar control over the last 2 to 3 months. You willreceive this test every 3 to 6 months. Using this medication with insulin or a sulfonylurea may increase your risk of hypoglycemia. Learn how to check your blood sugar. Learn the symptoms of lowand high blood sugar and how to manage them. Always carry a quick-source of sugar with you in case you have symptoms of low blood sugar. Examples include hard sugar candy or glucose tablets. Make sure others know that you can choke if you eat or drink when you develop serious symptoms of low blood sugar, such as seizures or unconsciousness. They must getmedical help at once. Tell your care team if you have high blood sugar. You might need to change the dose of your medication. If you are  sick or exercising more than usual, youmight need to  change the dose of your medication. Do not skip meals. Ask your care team if you should avoid alcohol. Many nonprescription cough and cold products contain sugar or alcohol. These canaffect blood sugar. This medication may cause ovulation in premenopausal women who do not have regular monthly periods. This may increase your chances of becoming pregnant. You should not take this medication if you become pregnant or think you may be pregnant. Talk with your care team about your birth control options while taking this medication. Contact your care team right away if you think you arepregnant. If you are going to need surgery, an MRI, CT scan, or other procedure, tell your care team that you are taking this medication. You may need to stop takingthis medication before the procedure. Wear a medical ID bracelet or chain, and carry a card that describes yourdisease and details of your medication and dosage times. This medication may cause a decrease in folic acid and vitamin B12. You should make sure that you get enough vitamins while you are taking this medication.Discuss the foods you eat and the vitamins you take with your care team. What side effects may I notice from receiving this medication? Side effects that you should report to your care team as soon as possible: Allergic reactions-skin rash, itching, hives, swelling of the face, lips, tongue, or throat High lactic acid level-muscle pain or cramps, stomach pain, trouble breathing, general discomfort or fatigue Low vitamin B12 level-pain, tingling, or numbness in the hands or feet, muscle weakness, dizziness, confusion, difficulty concentrating Side effects that usually do not require medical attention (report to your careteam if they continue or are bothersome): Diarrhea Gas Headache Metallic taste in mouth Nausea This list may not describe all possible side effects. Call your doctor for medical advice about side effects. You may report side  effects to FDA at1-800-FDA-1088. Where should I keep my medication? Keep out of the reach of children and pets. Store at room temperature between 15 and 30 degrees C (59 and 86 degrees F). Protect from moisture and light. Get rid of any unused medication after theexpiration date. To get rid of medications that are no longer needed or expired: Take the medication to a medication take-back program. Check with your pharmacy or law enforcement to find a location. If you cannot return the medication, check the label or package insert to see if the medication should be thrown out in the garbage or flushed down the toilet. If you are not sure, ask your care team. If it is safe to put in the trash, empty the medication out of the container. Mix the medication with cat litter, dirt, coffee grounds, or other unwanted substance. Seal the mixture in a bag or container. Put it in the trash. NOTE: This sheet is a summary. It may not cover all possible information. If you have questions about this medicine, talk to your doctor, pharmacist, orhealth care provider.  2022 Elsevier/Gold Standard (2020-01-14 10:29:07)

## 2020-11-06 ENCOUNTER — Ambulatory Visit: Payer: Medicaid Other | Attending: Gynecologic Oncology | Admitting: Physical Therapy

## 2020-11-06 ENCOUNTER — Encounter: Payer: Self-pay | Admitting: Physical Therapy

## 2020-11-06 ENCOUNTER — Other Ambulatory Visit: Payer: Self-pay

## 2020-11-06 DIAGNOSIS — R252 Cramp and spasm: Secondary | ICD-10-CM | POA: Diagnosis present

## 2020-11-06 DIAGNOSIS — N39498 Other specified urinary incontinence: Secondary | ICD-10-CM

## 2020-11-06 DIAGNOSIS — R2689 Other abnormalities of gait and mobility: Secondary | ICD-10-CM | POA: Diagnosis present

## 2020-11-06 DIAGNOSIS — M6281 Muscle weakness (generalized): Secondary | ICD-10-CM | POA: Diagnosis not present

## 2020-11-06 NOTE — Therapy (Addendum)
[facility] [address] [place], [place], [address] Phone: [phone]   Fax:  [phone]  Physical Therapy Treatment  Patient Details  Name: Tamara Osborne MRN: [id number] Date of Birth: 02/19/1976 Referring Provider (PT): [name], NP   Encounter Date: 11/06/2020   PT End of Session - 11/06/20 1320     Visit Number 1    Date for PT Re-Evaluation 01/29/21    Authorization Type [employer]    PT Start Time [id number]    PT Stop Time 1225    PT Time Calculation (min) 40 min    Activity Tolerance Patient tolerated treatment well;No increased pain    Behavior During Therapy WFL for tasks assessed/performed             Past Medical History:  Diagnosis Date   Anemia    Anxiety    Bartholin cyst    Cervical cancer ([place])    Constipation    Depression    Deviated septum    Diabetes mellitus without complication ([place])    Difficult intravenous access    used port for 05-09-2019 surgery   Fibroids    History of blood transfusion    2 units given 04-26-2019, has had total of 8 units since jan 2021   History of blood transfusion 1 unit given 05-30-19   iv fluids also given   History of kidney problems    History of radiation therapy 03/23/2019-05/04/2019   Cervical IMRT   Dr Gery Pray   History of radiation therapy 05/09/2019-06/05/2019   vaginal brachytherapy   Dr Gery Pray   History of recent blood transfusion 05/16/2019   2 units given  per dr Pollyann Savoy at cancer center   Hypertension    Neuropathy    both thumbs   Obesity    Palpitations    PONV (postoperative nausea and vomiting)    Prediabetes    Preeclampsia 2006   Sleep apnea    no cpap used insurance would not cover, osa severe per pt   SOB (shortness of breath)    Swallowing difficulty     Past Surgical History:  Procedure Laterality Date   CESAREAN SECTION WITH BILATERAL TUBAL LIGATION  11/10/2004       DILATION AND CURETTAGE OF UTERUS   1997   IR IMAGING GUIDED PORT INSERTION  04/04/2019   OPERATIVE ULTRASOUND N/A 05/09/2019   Procedure: OPERATIVE ULTRASOUND;  Surgeon: Gery Pray, MD;  Location: [place];  Service: Urology;  Laterality: N/A;   OPERATIVE ULTRASOUND N/A 05/15/2019   Procedure: OPERATIVE ULTRASOUND;  Surgeon: Gery Pray, MD;  Location: [facility];  Service: Urology;  Laterality: N/A;   OPERATIVE ULTRASOUND N/A 05/22/2019   Procedure: OPERATIVE ULTRASOUND;  Surgeon: Gery Pray, MD;  Location: [facility];  Service: Urology;  Laterality: N/A;   OPERATIVE ULTRASOUND N/A 05/29/2019   Procedure: OPERATIVE ULTRASOUND;  Surgeon: Gery Pray, MD;  Location[facility];  Service: Urology;  Laterality: N/A;   OPERATIVE ULTRASOUND N/A 06/05/2019   Procedure: OPERATIVE ULTRASOUND;  Surgeon: Gery Pray, MD;  Location: [facility];  Service: Urology;  Laterality: N/A;   TANDEM RING INSERTION N/A 05/09/2019   Procedure: TANDEM RING INSERTION;  Surgeon: Gery Pray, MD;  Location: [facility];  Service: Urology;  Laterality: N/A;   TANDEM RING INSERTION N/A 05/15/2019   Procedure: TANDEM RING INSERTION;  Surgeon: Gery Pray, MD;  Location: [facility];  Service: Urology;  Laterality: N/A;   TANDEM RING INSERTION N/A 05/22/2019   Procedure: TANDEM RING INSERTION;  Surgeon: Gery Pray, MD;  Location: [facility];  Service: Urology;  Laterality: N/A;   TANDEM RING INSERTION N/A 05/29/2019   Procedure: TANDEM RING INSERTION;  Surgeon: Gery Pray, MD;  Location: [facility];  Service: Urology;  Laterality: N/A;   TANDEM RING INSERTION N/A 06/05/2019   Procedure: TANDEM RING INSERTION;  Surgeon: Gery Pray, MD;  Location: [facility];  Service: Urology;  Laterality: N/A;    There were no vitals filed for this visit.   Subjective Assessment - 11/06/20 1151      Subjective MD wants me to try physical therapy for my leakage. Patient is diabetic and chronic kidney disease. I am having accidents at night due to deep sleep with sleep apnea machine. I am sleeping 7-8 hours of sleep.    Patient Stated Goals reduce urinary leakage    Currently in Pain? Yes    Pain Score 5     Pain Location Pelvis    Pain Orientation Right;Left;Lower    Pain Descriptors / Indicators Pressure    Pain Type Chronic pain    Pain Onset More than a month ago    Pain Frequency Intermittent    Aggravating Factors  going to the bathroom; when bladder is full, when first wake up    Pain Relieving Factors after going to the bathroom    Multiple Pain Sites No                [facility] PT Assessment - 11/06/20 0001       Assessment   Medical Diagnosis N39.498 [place] urinary incontinence    Referring Provider (PT) [name], NP    Onset Date/Surgical Date 08/30/20    Prior Therapy yes and ended on last visit on 05/16/2020      Precautions   Precautions Other (comment)    Precaution Comments cervical cancer with radiation      Restrictions   Weight Bearing Restrictions No      Balance Screen   Has the patient fallen in the past 6 months Yes    How many times? 2   due to balance   Has the patient had a decrease in activity level because of a fear of falling?  No    Is the patient reluctant to leave their home because of a fear of falling?  No      Home Ecologist residence      Prior Function   Level of Independence Independent    Leisure water aerobics      Cognition   Overall Cognitive Status Within Functional Limits for tasks assessed      Observation/Other Assessments   Focus on Therapeutic Outcomes (FOTO)  PFIQ-7 52; UIQ-7 38; POPIQ-7 14      Posture/Postural Control   Posture/Postural Control Postural limitations    Posture Comments obese      ROM / Strength   AROM / PROM / Strength AROM;PROM;Strength      AROM   Right  Hip Extension -15    Left Hip Extension -20    Lumbar Flexion decreased by 25%; no flexion in the lumbar    Lumbar Extension decreased by 75%    Lumbar - Right Side Bend full    Lumbar - Left Side Bend decreased by 25%    Lumbar - Right Rotation  decreased by 25%    Lumbar - Left Rotation decreased by 25%      PROM   Right Hip Extension -10    Right Hip ABduction 10    Left Hip Extension -5    Left Hip ABduction 20      Strength   Right Hip Extension 3-/5    Right Hip ABduction 3/5    Left Hip Extension 3-/5    Left Hip ABduction 3/5      Ambulation/Gait   Gait Pattern Decreased dorsiflexion - right;Decreased dorsiflexion - left;Decreased stride length;Trunk flexed;Wide base of support      Standardized Balance Assessment   Standardized Balance Assessment Timed Up and Go Test;Five Times Sit to Stand    Five times sit to stand comments  17 sec      Timed Up and Go Test   TUG Normal TUG    Normal TUG (seconds) 13                        Pelvic Floor Special Questions - 11/06/20 0001     External Palpation tenderness located on the left ishiocavernosus and bulbocavernosus    Pelvic Floor Internal Exam Patient confirms identificaiton and approves PT to assess pelvic floor and treatment    Exam Type Vaginal    Palpation tightness in the introitus    Strength weak squeeze, no lift            No emotional/communication barriers or cognitive limitation. Patient is motivated to learn. Patient understands and agrees with treatment goals and plan. PT explains patient will be examined in standing, sitting, and lying down to see how their muscles and joints work. When they are ready, they will be asked to remove their underwear so PT can examine their perineum. The patient is also given the option of providing their own chaperone as one is not provided in our facility. The patient also has the right and is explained the right to defer or refuse any part of the evaluation  or treatment including the internal exam. With the patient's consent, PT will use one gloved finger to gently assess the muscles of the pelvic floor, seeing how well it contracts and relaxes and if there is muscle symmetry. After, the patient will get dressed and PT and patient will discuss exam findings and plan of care. PT and patient discuss plan of care, schedule, attendance policy and HEP activities.          Upper Extremity Functional Index Score :   /80   PT Education - 11/06/20 1319     Education Details Access Code: [facility]    Person(s) Educated Patient    Methods Explanation;Demonstration;Verbal cues;Handout    Comprehension Returned demonstration;Verbalized understanding              PT Short Term Goals - 11/06/20 1324       PT SHORT TERM GOAL #1   Title independent with initial HEP    Baseline --    Time 4    Period Weeks    Status New    Target Date 12/04/20      PT SHORT TERM GOAL #2   Title educated on vaginal moisturizers to improve tissue integrity    Baseline --    Time 4    Period Weeks    Status New    Target Date 12/04/20      PT SHORT TERM GOAL #3   Title ---  Baseline --               PT Long Term Goals - 11/06/20 1354       PT LONG TERM GOAL #1   Title independent with advanced exercises for strength, flexibility, and endurance    Baseline not educated yet    Time 12    Period Weeks    Status New    Target Date 01/29/21      PT LONG TERM GOAL #2   Title urinary leakage decreased >/= 75% due to improved pelvic floor strength >/= 3/5 and not holding her breath    Baseline urinary leakage with activity    Time 12    Period Weeks    Status New    Target Date 01/29/21      PT LONG TERM GOAL #3   Title sit to stand </= 11.5 seconds so she feels steadier on her feet    Baseline sit to stand 17 seconds    Time 12    Period Weeks    Status New    Target Date 01/29/21      PT LONG TERM GOAL #4   Title ambulate with  increased hip extension increased >/= 5 degrees to reduce risk of falls    Baseline hip extension -20 degrees bilaterally    Time 12    Period Weeks    Status New    Target Date 01/29/21      PT LONG TERM GOAL #5   Title ----    Baseline ---                   Plan - 11/06/20 1226     Clinical Impression Statement Patient is a 45 year old female with urinary leakage and trouble with balance. Patient started to have increased trouble with urinary leakage in 08/2020 and her balance. She did have therapy before and discharged in 05/16/2020. Patient pelvic floor strength is 2/5 with tightness in the introitus and holds her breath. Patient has tenderness located in the left ischiocavernosus and bulbocavernosus. Patient leaks urine with coughing, strong urge, laughing, coughing, and walking. Patient wears a pad daily. She will leak at night due to bieng in a deep sleep with her C-pap machine. Patient has stiffness in her hips for extension and abduction. Her hip strength averages 3-4/5. Patient walks with decreased bilateral hip extension, decreased heel strike and wide base of support. Patient reports she has fallen in the past 6 months 2 times. Sit to stand time is 17 seconds when norm is 11.4 seconds. Patient will benefit from skilled therapy to improve pelvic floor strength, improve coordination, and balance.    Personal Factors and Comorbidities Fitness;Comorbidity 3+;Time since onset of injury/illness/exacerbation    Comorbidities cervical cancer with radiation on 03/23/2019-05/04/2019 and 05/09/2019-06/15/2019 ; diabetes; c-section 11/10/2004    Examination-Activity Limitations Locomotion Level;Sleep;Continence;Toileting;Bend    Examination-Participation Restrictions Community Activity    Stability/Clinical Decision Making Evolving/Moderate complexity    Clinical Decision Making Moderate    Rehab Potential Good    PT Frequency 2x / week    PT Duration 12 weeks    PT Treatment/Interventions  ADLs/Self Care Home Management;Biofeedback;Therapeutic activities;Therapeutic exercise;Balance training;Neuromuscular re-education;Patient/family education;Manual techniques;Passive range of motion;Dry needling;Spinal Manipulations;Joint Manipulations;Aquatic Therapy    PT Next Visit Plan when in the pool working on balance, hip and trunk mobility, and core strength; pelvic floor work on manual mobilizaiton of the vaginal tissue, joint mobilization of the hips and pelvic floor  contractions            Addend:  Patient will come  for 12 visits within a 12 week period. No more than 2 x or 1 x per week for frequency. Earlie Counts, PT 11/26/20 4:51 PM  Patient will benefit from skilled therapeutic intervention in order to improve the following deficits and impairments:  Abnormal gait, Decreased coordination, Decreased range of motion, Difficulty walking, Increased fascial restricitons, Increased muscle spasms, Pain, Decreased strength, Decreased mobility, Decreased balance, Impaired flexibility  Visit Diagnosis: Muscle weakness (generalized)  Cramp and spasm  Other urinary incontinence  Other abnormalities of gait and mobility     Problem List Patient Active Problem List   Diagnosis Date Noted   Incontinence of urine in female 09/09/2020   Onychomycosis of toenail 07/31/2020   OSA (obstructive sleep apnea) 07/31/2020   Prediabetes 05/27/2020   Sinus congestion 04/16/2020   Port-A-Cath in place 01/22/2020   Other hyperlipidemia 01/10/2020   Vitamin D deficiency 01/10/2020   Insulin resistance 01/10/2020   Class 3 severe obesity with serious comorbidity and body mass index (BMI) of 40.0 to 44.9 in adult ([place]) 01/10/2020   Obesity, Class II, BMI 35-39.9 09/07/2019   Dehydration 05/30/2019   Peripheral neuropathy due to chemotherapy ([place]) 04/25/2019   Nausea without vomiting 04/18/2019   Diarrhea 04/04/2019   CKD (chronic kidney disease), symptom management only, stage 3 (moderate)  ([place]) 03/30/2019   Generalized anxiety disorder 03/30/2019   Essential hypertension    Anxiety 03/23/2019   Squamous cell carcinoma of cervix ([place]) 03/23/2019   Deficiency anemia 03/22/2019   Squamous cell carcinoma in situ (SCCIS) of cervix 03/22/2019    Earlie Counts, PT 11/06/20 1:59 PM  [facility] [address], [place], [place], [address] Phone: [phone]   Fax:  [phone]  Name: NEVAEH POINDEXTER MRN: [id number] Date of Birth: May 01, 1975

## 2020-11-06 NOTE — Patient Instructions (Signed)
Access Code: [facility] URL: [email or web address] Date: 11/06/2020 Prepared by: Earlie Counts  Program Notes use coconut oil in the vaginal canal daily no drinking liquids after 7:30 at night   Exercises Supine Pelvic Floor Contraction - 3 x daily - 7 x weekly - 1 sets - 5 reps - 5 sec hold [facility] [address] [place], [place] [address] Phone # [phone] Fax [phone]

## 2020-11-08 ENCOUNTER — Other Ambulatory Visit: Payer: Self-pay

## 2020-11-08 ENCOUNTER — Ambulatory Visit: Payer: Medicaid Other | Admitting: Physical Therapy

## 2020-11-08 ENCOUNTER — Encounter: Payer: Self-pay | Admitting: Physical Therapy

## 2020-11-08 DIAGNOSIS — M6281 Muscle weakness (generalized): Secondary | ICD-10-CM

## 2020-11-08 DIAGNOSIS — R2689 Other abnormalities of gait and mobility: Secondary | ICD-10-CM

## 2020-11-08 DIAGNOSIS — R252 Cramp and spasm: Secondary | ICD-10-CM

## 2020-11-08 DIAGNOSIS — N39498 Other specified urinary incontinence: Secondary | ICD-10-CM

## 2020-11-08 NOTE — Therapy (Signed)
[facility] [address] [place], [place], [address] Phone: [phone]   Fax:  [phone]  Physical Therapy Treatment  Patient Details  Name: Tamara Osborne MRN: [id number] Date of Birth: 10/19/1975 Referring Provider (PT): [name], NP   Encounter Date: 11/08/2020   PT End of Session - 11/08/20 1519     Visit Number 2    Date for PT Re-Evaluation 01/29/21    Authorization Type [employer]    PT Start Time 1300    PT Stop Time 1340    PT Time Calculation (min) 40 min    Activity Tolerance Patient tolerated treatment well;No increased pain    Behavior During Therapy WFL for tasks assessed/performed             Past Medical History:  Diagnosis Date   Anemia    Anxiety    Bartholin cyst    Cervical cancer ([place])    Constipation    Depression    Deviated septum    Diabetes mellitus without complication ([place])    Difficult intravenous access    used port for 05-09-2019 surgery   Fibroids    History of blood transfusion    2 units given 04-26-2019, has had total of 8 units since jan 2021   History of blood transfusion 1 unit given 05-30-19   iv fluids also given   History of kidney problems    History of radiation therapy 03/23/2019-05/04/2019   Cervical IMRT   Dr Gery Pray   History of radiation therapy 05/09/2019-06/05/2019   vaginal brachytherapy   Dr Gery Pray   History of recent blood transfusion 05/16/2019   2 units given  per dr Pollyann Savoy at cancer center   Hypertension    Neuropathy    both thumbs   Obesity    Palpitations    PONV (postoperative nausea and vomiting)    Prediabetes    Preeclampsia 2006   Sleep apnea    no cpap used insurance would not cover, osa severe per pt   SOB (shortness of breath)    Swallowing difficulty     Past Surgical History:  Procedure Laterality Date   CESAREAN SECTION WITH BILATERAL TUBAL LIGATION  11/10/2004       DILATION AND CURETTAGE OF UTERUS   1997   IR IMAGING GUIDED PORT INSERTION  04/04/2019   OPERATIVE ULTRASOUND N/A 05/09/2019   Procedure: OPERATIVE ULTRASOUND;  Surgeon: Gery Pray, MD;  Location: [facility];  Service: Urology;  Laterality: N/A;   OPERATIVE ULTRASOUND N/A 05/15/2019   Procedure: OPERATIVE ULTRASOUND;  Surgeon: Gery Pray, MD;  Location: [facility];  Service: Urology;  Laterality: N/A;   OPERATIVE ULTRASOUND N/A 05/22/2019   Procedure: OPERATIVE ULTRASOUND;  Surgeon: Gery Pray, MD;  Location: [facility];  Service: Urology;  Laterality: N/A;   OPERATIVE ULTRASOUND N/A 05/29/2019   Procedure: OPERATIVE ULTRASOUND;  Surgeon: Gery Pray, MD;  Location: [facility];  Service: Urology;  Laterality: N/A;   OPERATIVE ULTRASOUND N/A 06/05/2019   Procedure: OPERATIVE ULTRASOUND;  Surgeon: Gery Pray, MD;  Location: [facility];  Service: Urology;  Laterality: N/A;   TANDEM RING INSERTION N/A 05/09/2019   Procedure: TANDEM RING INSERTION;  Surgeon: Gery Pray, MD;  Location: [facility];  Service: Urology;  Laterality: N/A;   TANDEM RING INSERTION N/A 05/15/2019   Procedure: TANDEM RING INSERTION;  Surgeon: Gery Pray, MD;  Location: [facility];  Service: Urology;  Laterality: N/A;   TANDEM RING INSERTION N/A 05/22/2019   Procedure: TANDEM RING INSERTION;  Surgeon: Gery Pray, MD;  Location: [facility];  Service: Urology;  Laterality: N/A;   TANDEM RING INSERTION N/A 05/29/2019   Procedure: TANDEM RING INSERTION;  Surgeon: Gery Pray, MD;  Location: [facility];  Service: Urology;  Laterality: N/A;   TANDEM RING INSERTION N/A 06/05/2019   Procedure: TANDEM RING INSERTION;  Surgeon: Gery Pray, MD;  Location: [facility];  Service: Urology;  Laterality: N/A;    There were no vitals filed for this visit.   Subjective Assessment - 11/08/20 1518      Subjective I feel ok today, just numb toes.    Currently in Pain? No/denies    Multiple Pain Sites No            Treatment: Patient seen for aquatic therapy today.  Treatment took place in water 2.5-4 feet deep depending upon activity.  Pt entered the pool via stairs, slowly with heavy use of rails. Pt requires buoyancy of water for support and to offload joints with strengthening exercises.  Water temp 94 degrees F.   Seated water bench with 75% submersion Pt performed seated LE AROM exercises 20x in all planes, PTA educated pt in water principles and how we would use them, Pt verbally understood, pain assessment. Abdominal compressions with nekadoodle 10x 3 sec hold, VC on technique. Gentle shoulder horizontal abd/add 1 min.  Standing in 75% depth for the following: Water walking 4 lengths in each direction Tandem stance 2x Bil 20 sec Wall exercises for hip AROM 10x Bil  all directions minimal UE touch High knee marching with large noodle 4x 4 min seated decompression float for relaxation                             PT Short Term Goals - 11/06/20 1324       PT SHORT TERM GOAL #1   Title independent with initial HEP    Baseline --    Time 4    Period Weeks    Status New    Target Date 12/04/20      PT SHORT TERM GOAL #2   Title educated on vaginal moisturizers to improve tissue integrity    Baseline --    Time 4    Period Weeks    Status New    Target Date 12/04/20      PT SHORT TERM GOAL #3   Title ---    Baseline --               PT Long Term Goals - 11/06/20 1354       PT LONG TERM GOAL #1   Title independent with advanced exercises for strength, flexibility, and endurance    Baseline not educated yet    Time 12    Period Weeks    Status New    Target Date 01/29/21      PT LONG TERM GOAL #2   Title urinary leakage decreased >/= 75% due to improved pelvic floor strength >/= 3/5 and not holding her breath    Baseline urinary  leakage with activity    Time 12    Period Weeks    Status New    Target Date 01/29/21      PT LONG TERM GOAL #3   Title sit to stand </=  11.5 seconds so she feels steadier on her feet    Baseline sit to stand 17 seconds    Time 12    Period Weeks    Status New    Target Date 01/29/21      PT LONG TERM GOAL #4   Title ambulate with increased hip extension increased >/= 5 degrees to reduce risk of falls    Baseline hip extension -20 degrees bilaterally    Time 12    Period Weeks    Status New    Target Date 01/29/21      PT LONG TERM GOAL #5   Title ----    Baseline ---                   Plan - 11/08/20 1520     Clinical Impression Statement Pt arrives to aquatic PT for her first session. Pt reports no pain before, during and after. Pt has trouble controling her pelvis in the water due to weakness in her core. Pt performed all balance and hip AROM exercises without issues. Some fatigue noted near end of session.    Personal Factors and Comorbidities Fitness;Comorbidity 3+;Time since onset of injury/illness/exacerbation    Comorbidities cervical cancer with radiation on 03/23/2019-05/04/2019 and 05/09/2019-06/15/2019 ; diabetes; c-section 11/10/2004    Examination-Activity Limitations Locomotion Level;Sleep;Continence;Toileting;Bend    Examination-Participation Restrictions Community Activity    Stability/Clinical Decision Making Evolving/Moderate complexity    Rehab Potential Good    PT Frequency 2x / week    PT Duration 12 weeks    PT Treatment/Interventions ADLs/Self Care Home Management;Biofeedback;Therapeutic activities;Therapeutic exercise;Balance training;Neuromuscular re-education;Patient/family education;Manual techniques;Passive range of motion;Dry needling;Spinal Manipulations;Joint Manipulations;Aquatic Therapy    PT Next Visit Plan when in the pool working on balance, hip and trunk mobility, and core strength; pelvic floor work on manual mobilizaiton of the  vaginal tissue, joint mobilization of the hips and pelvic floor contractions    Consulted and Agree with Plan of Care Patient             Patient will benefit from skilled therapeutic intervention in order to improve the following deficits and impairments:  Abnormal gait, Decreased coordination, Decreased range of motion, Difficulty walking, Increased fascial restricitons, Increased muscle spasms, Pain, Decreased strength, Decreased mobility, Decreased balance, Impaired flexibility  Visit Diagnosis: Cramp and spasm  Muscle weakness (generalized)  Other urinary incontinence  Other abnormalities of gait and mobility     Problem List Patient Active Problem List   Diagnosis Date Noted   Incontinence of urine in female 09/09/2020   Onychomycosis of toenail 07/31/2020   OSA (obstructive sleep apnea) 07/31/2020   Prediabetes 05/27/2020   Sinus congestion 04/16/2020   Port-A-Cath in place 01/22/2020   Other hyperlipidemia 01/10/2020   Vitamin D deficiency 01/10/2020   Insulin resistance 01/10/2020   Class 3 severe obesity with serious comorbidity and body mass index (BMI) of 40.0 to 44.9 in adult ([place]) 01/10/2020   Obesity, Class II, BMI 35-39.9 09/07/2019   Dehydration 05/30/2019   Peripheral neuropathy due to chemotherapy ([place]) 04/25/2019   Nausea without vomiting 04/18/2019   Diarrhea 04/04/2019   CKD (chronic kidney disease), symptom management only, stage 3 (moderate) (HCC) 03/30/2019   Generalized anxiety disorder 03/30/2019   Essential hypertension    Anxiety 03/23/2019   Squamous cell carcinoma of cervix ([place]) 03/23/2019   Deficiency anemia 03/22/2019   Squamous cell carcinoma in situ (SCCIS) of cervix 03/22/2019    Justise Ehmann, PTA 11/08/2020, 3:23 PM  [facility] [address], [place], [place], [address] Phone: [phone]   Fax:  [phone]  Name: Tamara Osborne MRN: [id number] Date of  Birth: 11/18/1975

## 2020-11-13 ENCOUNTER — Other Ambulatory Visit: Payer: Self-pay

## 2020-11-13 ENCOUNTER — Encounter: Payer: Self-pay | Admitting: Physical Therapy

## 2020-11-13 ENCOUNTER — Ambulatory Visit (INDEPENDENT_AMBULATORY_CARE_PROVIDER_SITE_OTHER): Payer: Medicaid Other | Admitting: Adult Health

## 2020-11-13 ENCOUNTER — Ambulatory Visit: Payer: Medicaid Other | Admitting: Physical Therapy

## 2020-11-13 DIAGNOSIS — M6281 Muscle weakness (generalized): Secondary | ICD-10-CM

## 2020-11-13 DIAGNOSIS — R2689 Other abnormalities of gait and mobility: Secondary | ICD-10-CM

## 2020-11-13 DIAGNOSIS — R252 Cramp and spasm: Secondary | ICD-10-CM

## 2020-11-13 DIAGNOSIS — N39498 Other specified urinary incontinence: Secondary | ICD-10-CM

## 2020-11-13 NOTE — Therapy (Signed)
[facility] [address], [place], [place], [address] Phone: [phone]   Fax:  [phone]  Physical Therapy Treatment  Patient Details  Name: Tamara Osborne MRN: [id number] Date of Birth: 1975/08/19 Referring Provider (PT): [name], NP   Encounter Date: 11/13/2020   PT End of Session - 11/13/20 1145     Visit Number 3    Date for PT Re-Evaluation 01/29/21    Authorization Type [employer] blue    PT Start Time 1100    PT Stop Time 1142    PT Time Calculation (min) 42 min    Activity Tolerance Patient tolerated treatment well;No increased pain    Behavior During Therapy WFL for tasks assessed/performed             Past Medical History:  Diagnosis Date   Anemia    Anxiety    Bartholin cyst    Cervical cancer ([place])    Constipation    Depression    Deviated septum    Diabetes mellitus without complication ([place])    Difficult intravenous access    used port for 05-09-2019 surgery   Fibroids    History of blood transfusion    2 units given 04-26-2019, has had total of 8 units since jan 2021   History of blood transfusion 1 unit given 05-30-19   iv fluids also given   History of kidney problems    History of radiation therapy 03/23/2019-05/04/2019   Cervical IMRT   Dr Gery Pray   History of radiation therapy 05/09/2019-06/05/2019   vaginal brachytherapy   Dr Gery Pray   History of recent blood transfusion 05/16/2019   2 units given  per dr Pollyann Savoy at cancer center   Hypertension    Neuropathy    both thumbs   Obesity    Palpitations    PONV (postoperative nausea and vomiting)    Prediabetes    Preeclampsia 2006   Sleep apnea    no cpap used insurance would not cover, osa severe per pt   SOB (shortness of breath)    Swallowing difficulty     Past Surgical History:  Procedure Laterality Date   CESAREAN SECTION WITH BILATERAL TUBAL LIGATION  11/10/2004       DILATION AND CURETTAGE OF UTERUS   1997   IR IMAGING GUIDED PORT INSERTION  04/04/2019   OPERATIVE ULTRASOUND N/A 05/09/2019   Procedure: OPERATIVE ULTRASOUND;  Surgeon: Gery Pray, MD;  Location: [facility];  Service: Urology;  Laterality: N/A;   OPERATIVE ULTRASOUND N/A 05/15/2019   Procedure: OPERATIVE ULTRASOUND;  Surgeon: Gery Pray, MD;  Location: [facility];  Service: Urology;  Laterality: N/A;   OPERATIVE ULTRASOUND N/A 05/22/2019   Procedure: OPERATIVE ULTRASOUND;  Surgeon: Gery Pray, MD;  Location: [facility];  Service: Urology;  Laterality: N/A;   OPERATIVE ULTRASOUND N/A 05/29/2019   Procedure: OPERATIVE ULTRASOUND;  Surgeon: Gery Pray, MD;  Location: [facility];  Service: Urology;  Laterality: N/A;   OPERATIVE ULTRASOUND N/A 06/05/2019   Procedure: OPERATIVE ULTRASOUND;  Surgeon: Gery Pray, MD;  Location: [facility];  Service: Urology;  Laterality: N/A;   TANDEM RING INSERTION N/A 05/09/2019   Procedure: TANDEM RING INSERTION;  Surgeon: Gery Pray, MD;  Location: [facility];  Service: Urology;  Laterality: N/A;   TANDEM RING INSERTION N/A 05/15/2019   Procedure: TANDEM RING INSERTION;  Surgeon: Gery Pray, MD;  Location: [facility];  Service: Urology;  Laterality: N/A;   TANDEM RING INSERTION N/A 05/22/2019   Procedure: TANDEM RING INSERTION;  Surgeon: Gery Pray, MD;  Location: [facility];  Service: Urology;  Laterality: N/A;   TANDEM RING INSERTION N/A 05/29/2019   Procedure: TANDEM RING INSERTION;  Surgeon: Gery Pray, MD;  Location: [facility];  Service: Urology;  Laterality: N/A;   TANDEM RING INSERTION N/A 06/05/2019   Procedure: TANDEM RING INSERTION;  Surgeon: Gery Pray, MD;  Location: [facility];  Service: Urology;  Laterality: N/A;    There were no vitals filed for this visit.   Subjective Assessment - 11/13/20 1105      Subjective I enjoyed the pool and learned balance exercises. I had to stop at [facility] prior to getting here due to having to go to the bathroom. Some days are worse than others. The water helps with the neuropathy. Tomorrow I see the diabetes doctor. Sometimes I feel like air in the vaginal or a bubble feeling.    Patient Stated Goals reduce urinary leakage    Currently in Pain? Yes    Pain Score 4     Pain Location Perineum    Pain Orientation Right;Mid    Pain Descriptors / Indicators Sharp    Pain Type Chronic pain    Pain Onset More than a month ago    Pain Frequency Intermittent    Aggravating Factors  going to the bathroom, when bladder is full, when first wake up    Pain Relieving Factors after going to the bathoom    Multiple Pain Sites No                            Pelvic Floor Special Questions - 11/13/20 0001     Pelvic Floor Internal Exam Patient confirms identificaiton and approves PT to assess pelvic floor and treatment    Exam Type Vaginal    Strength weak squeeze, no lift               [facility] Adult PT Treatment/Exercise - 11/13/20 0001       Self-Care   Self-Care Other Self-Care Comments    Other Self-Care Comments  educated patient on bladder diary and how  to fill it out to see when she may have irritants in the diet; discussed what vaginal moisturizer patient is using and to use it daily.      Lumbar Exercises: Stretches   Hip Flexor Stretch Right;Left;5 reps    Hip Flexor Stretch Limitations prone with therapist assisting      Lumbar Exercises: Aerobic   Stationary Bike level 1 for 5 mintues while assessing patient      Lumbar Exercises: Supine   Bridge 15 reps    Bridge Limitations to increase hip extension    Other Supine Lumbar Exercises supine pelvic floor contraction with verbal cues to breath and tactile cues for correct contraction      Manual Therapy   Manual Therapy Joint mobilization;Internal Pelvic Floor    Joint  Mobilization anterior glide of the femoral head grade 3 bil.    Internal Pelvic Floor manual work to the right side of the vaginal canal                     PT Education - 11/13/20 1142     Education Details Access Code: [email or web address]    Person(s) Educated Patient  Methods Explanation;Demonstration;Handout    Comprehension Returned demonstration;Verbalized understanding              PT Short Term Goals - 11/06/20 1324       PT SHORT TERM GOAL #1   Title independent with initial HEP    Baseline --    Time 4    Period Weeks    Status New    Target Date 12/04/20      PT SHORT TERM GOAL #2   Title educated on vaginal moisturizers to improve tissue integrity    Baseline --    Time 4    Period Weeks    Status New    Target Date 12/04/20      PT SHORT TERM GOAL #3   Title ---    Baseline --               PT Long Term Goals - 11/06/20 1354       PT LONG TERM GOAL #1   Title independent with advanced exercises for strength, flexibility, and endurance    Baseline not educated yet    Time 12    Period Weeks    Status New    Target Date 01/29/21      PT LONG TERM GOAL #2   Title urinary leakage decreased >/= 75% due to improved pelvic floor strength >/= 3/5 and not holding her breath    Baseline urinary leakage with activity    Time 12    Period Weeks    Status New    Target Date 01/29/21      PT LONG TERM GOAL #3   Title sit to stand </= 11.5 seconds so she feels steadier on her feet    Baseline sit to stand 17 seconds    Time 12    Period Weeks    Status New    Target Date 01/29/21      PT LONG TERM GOAL #4   Title ambulate with increased hip extension increased >/= 5 degrees to reduce risk of falls    Baseline hip extension -20 degrees bilaterally    Time 12    Period Weeks    Status New    Target Date 01/29/21      PT LONG TERM GOAL #5   Title ----    Baseline ---                   Plan - 11/13/20 1145     Clinical  Impression Statement Patient is able to contract her pelvic floor muscles when she coughs. When she coughs she will bulge her abdomen. Patient is able to feel the pelvic floor contraction. She has to stop on the way here due ot having to urinate. Patient has tightness in the pelvic floor muscles but will soften with manual work. Patient continues to have tightness in her hips. Patient has not met goals yet due to just starting therapy. She is focusing on balance in the pool. Patient will benefit from skilled therapy to improve balance, hip ROM and strength and reduce urinary leakage.    Personal Factors and Comorbidities Fitness;Comorbidity 3+;Time since onset of injury/illness/exacerbation    Comorbidities cervical cancer with radiation on 03/23/2019-05/04/2019 and 05/09/2019-06/15/2019 ; diabetes; c-section 11/10/2004    Examination-Activity Limitations Locomotion Level;Sleep;Continence;Toileting;Bend    Examination-Participation Restrictions Community Activity    Stability/Clinical Decision Making Evolving/Moderate complexity    Rehab Potential Good    PT Frequency 2x / week  PT Duration 12 weeks    PT Treatment/Interventions ADLs/Self Care Home Management;Biofeedback;Therapeutic activities;Therapeutic exercise;Balance training;Neuromuscular re-education;Patient/family education;Manual techniques;Passive range of motion;Dry needling;Spinal Manipulations;Joint Manipulations;Aquatic Therapy    PT Next Visit Plan when in the pool working on balance, hip  extension and trunk mobility, and core strength; pelvic floor work on manual mobilizaiton of the vaginal tissue, joint mobilization of the hips and pelvic floor contractions    PT Home Exercise Plan Access Code: [email or web address]    Recommended Other Services MD signed initial eval    Consulted and Agree with Plan of Care Patient             Patient will benefit from skilled therapeutic intervention in order to improve the following deficits and impairments:   Abnormal gait, Decreased coordination, Decreased range of motion, Difficulty walking, Increased fascial restricitons, Increased muscle spasms, Pain, Decreased strength, Decreased mobility, Decreased balance, Impaired flexibility  Visit Diagnosis: Cramp and spasm  Muscle weakness (generalized)  Other urinary incontinence  Other abnormalities of gait and mobility     Problem List Patient Active Problem List   Diagnosis Date Noted   Incontinence of urine in female 09/09/2020   Onychomycosis of toenail 07/31/2020   OSA (obstructive sleep apnea) 07/31/2020   Prediabetes 05/27/2020   Sinus congestion 04/16/2020   Port-A-Cath in place 01/22/2020   Other hyperlipidemia 01/10/2020   Vitamin D deficiency 01/10/2020   Insulin resistance 01/10/2020   Class 3 severe obesity with serious comorbidity and body mass index (BMI) of 40.0 to 44.9 in adult ([place]) 01/10/2020   Obesity, Class II, BMI 35-39.9 09/07/2019   Dehydration 05/30/2019   Peripheral neuropathy due to chemotherapy ([place]) 04/25/2019   Nausea without vomiting 04/18/2019   Diarrhea 04/04/2019   CKD (chronic kidney disease), symptom management only, stage 3 (moderate) (HCC) 03/30/2019   Generalized anxiety disorder 03/30/2019   Essential hypertension    Anxiety 03/23/2019   Squamous cell carcinoma of cervix ([place]) 03/23/2019   Deficiency anemia 03/22/2019   Squamous cell carcinoma in situ (SCCIS) of cervix 03/22/2019    Earlie Counts, PT 11/13/20 11:49 AM  [facility] [address], [place], [place], [address] Phone: [phone]   Fax:  [phone]  Name: Tamara Osborne MRN: [id number] Date of Birth: 07-12-1975

## 2020-11-13 NOTE — Patient Instructions (Signed)
Access Code: [facility] URL: [email or web address] Date: 11/13/2020 Prepared by: Earlie Counts  Exercises Supine Bridge - 1 x daily - 7 x weekly - 15 reps Supine Pelvic Floor Contraction - 4 x daily - 7 x weekly - 1 sets - 5 reps - 5 sec hold  [facility] [address] [place], [place] [address] Phone # [phone] Fax [phone]

## 2020-11-14 ENCOUNTER — Ambulatory Visit: Payer: Medicaid Other | Attending: Primary Care | Admitting: Pharmacist

## 2020-11-14 DIAGNOSIS — E8881 Metabolic syndrome: Secondary | ICD-10-CM

## 2020-11-14 LAB — GLUCOSE, POCT (MANUAL RESULT ENTRY): POC Glucose: 191 mg/dl — AB (ref 70–99)

## 2020-11-14 MED ORDER — ACCU-CHEK GUIDE VI STRP: ORAL_STRIP | 2 refills | Status: AC

## 2020-11-14 MED ORDER — METFORMIN HCL ER 500 MG PO TB24: 500.0000 mg | ORAL_TABLET | Freq: Every day | ORAL | 1 refills | Status: DC

## 2020-11-14 MED ORDER — ACCU-CHEK SOFTCLIX LANCETS MISC: 2 refills | Status: AC

## 2020-11-14 MED ORDER — ACCU-CHEK GUIDE W/DEVICE KIT: PACK | 0 refills | Status: AC

## 2020-11-14 NOTE — Progress Notes (Signed)
S:     No chief complaint on file.   Patient arrives good spirits.  Presents for diabetes evaluation, education, and management. Patient was referred and last seen by Primary Care Provider on 10/23/2020.   Patient was diagnosed with DM on 10/22/2020. Pt reports she had been prediabetic for years. PT has multiple things she would like to discuss today regarding disease state and medication management. She denies N/V or severe abdominal pain, endorses constipation and diarrhea occasionally.   Family/Social History:  -FHx: HTN, cancer, depression, anxiety, DM, CKD, HF -Tobacco: none  Insurance coverage/medication affordability: [place] [employer]  Medication adherence denied. She did not start the metformin (she was advised by another doctor to delay start until she had a chance to talk with pharmacy about it), she has been using the Victoza daily Current diabetes medications include: metformin 1000 mg BID (not taking), Victoza 1.8 mg daily  Current hypertension medications include: amlodipine-valsartan 10-320 mg daily  Current hyperlipidemia medications include: none  Patient endorses some symptoms occasionally when she "gets hungry" that are consistent with hypoglycemic events.  Patient reported dietary habits:  -Patient is engaged with [facility] [employer] [facility]  -Following their nutrition program   Patient-reported exercise habits:  -PT 2-3 x weekly -Water aerobics 2-3x weekly    Patient reports polyuria and neuropathy. However, she relates this to effects from prior chemo/radiation.  Patient endorses visual changes but relates these to variances in her blood pressure.  Patient denies self foot exams.     O:  POCT: 191 (post-prandial, ate at ~8:30a)  Lab Results  Component Value Date   HGBA1C 7.5 (H) 10/18/2020   There were no vitals filed for this visit.  Lipid Panel     Component Value Date/Time   CHOL 214 (H) 05/20/2020 1415   CHOL 222 (H) 09/20/2019 1106   TRIG  184 (H) 05/20/2020 1415   HDL 32 (L) 05/20/2020 1415   HDL 34 (L) 09/20/2019 1106   CHOLHDL 6.7 05/20/2020 1415   VLDL 37 05/20/2020 1415   LDLCALC 145 (H) 05/20/2020 1415   LDLCALC 158 (H) 09/20/2019 1106    Home fasting blood sugars: none to report, pt does not have a glucometer  Clinical Atherosclerotic Cardiovascular Disease (ASCVD): No  The 10-year ASCVD risk score (Arnett DK, et al., 2019) is: 6.1%   Values used to calculate the score:     Age: 45 years     Sex: Female     Is Non-Hispanic African American: No     Diabetic: Yes     Tobacco smoker: No     Systolic Blood Pressure: 655 mmHg     Is BP treated: Yes     HDL Cholesterol: 32 mg/dL     Total Cholesterol: 214 mg/dL    A/P: Diabetes newly diagnosed on 10/22/2020. Patient has been adherent to Victoza, but has not started metformin due to concerns with kidney function. Control is suboptimal due to non-adherence to full regimen. Discussed with patient the role of metformin in insulin resistance and she was amenable to inititaing it. Will have her follow up in 3-4 weeks to reassess eGFR. -Started metformin XR 500 mg daily  -Continued  Victoza 1.8 mg daily -Extensively discussed pathophysiology of diabetes, recommended lifestyle interventions, dietary effects on blood sugar control -F/U labs in 3-4 weeks  ASCVD risk - primary prevention in patient with diabetes. Last LDL is not controlled. ASCVD risk score is not >20%  - high intensity statin indicated. Aspirin is not  indicated.  -discuss with patient initiating statin therapy at next visit  HTN longstanding currently close to goal of <130/80 on current regimen. Patient had concerns that medication was causing gingival hyperplasia; however she has tried and failed other anti hypertensive medications in the past. Through shared decision making, she decided to continue on her current medication since it is the only medication thus far that has helped decrease her BP. Will  evaluate at a later time if necessary.   Written patient instructions provided.  Total time in face to face counseling 30 minutes.   Follow up Pharmacist Clinic Visit in 3-4 weeks.     Parthenia Ames, PharmD PGY-1 [facility] Resident [facility] and [facility]

## 2020-11-15 ENCOUNTER — Encounter (INDEPENDENT_AMBULATORY_CARE_PROVIDER_SITE_OTHER): Payer: Self-pay | Admitting: Adult Health

## 2020-11-15 ENCOUNTER — Ambulatory Visit: Payer: Medicaid Other | Admitting: Physical Therapy

## 2020-11-15 ENCOUNTER — Ambulatory Visit (INDEPENDENT_AMBULATORY_CARE_PROVIDER_SITE_OTHER): Payer: Medicaid Other | Admitting: Adult Health

## 2020-11-15 ENCOUNTER — Encounter: Payer: Self-pay | Admitting: Physical Therapy

## 2020-11-15 ENCOUNTER — Other Ambulatory Visit: Payer: Self-pay

## 2020-11-15 VITALS — BP 130/83 | HR 87 | Temp 98.3°F | Ht 62.0 in | Wt 224.0 lb

## 2020-11-15 DIAGNOSIS — E1159 Type 2 diabetes mellitus with other circulatory complications: Secondary | ICD-10-CM

## 2020-11-15 DIAGNOSIS — E119 Type 2 diabetes mellitus without complications: Secondary | ICD-10-CM | POA: Insufficient documentation

## 2020-11-15 DIAGNOSIS — N39498 Other specified urinary incontinence: Secondary | ICD-10-CM

## 2020-11-15 DIAGNOSIS — E1122 Type 2 diabetes mellitus with diabetic chronic kidney disease: Secondary | ICD-10-CM | POA: Insufficient documentation

## 2020-11-15 DIAGNOSIS — M6281 Muscle weakness (generalized): Secondary | ICD-10-CM | POA: Diagnosis not present

## 2020-11-15 DIAGNOSIS — R252 Cramp and spasm: Secondary | ICD-10-CM

## 2020-11-15 DIAGNOSIS — I152 Hypertension secondary to endocrine disorders: Secondary | ICD-10-CM | POA: Diagnosis not present

## 2020-11-15 DIAGNOSIS — Z6841 Body Mass Index (BMI) 40.0 and over, adult: Secondary | ICD-10-CM

## 2020-11-15 DIAGNOSIS — R2689 Other abnormalities of gait and mobility: Secondary | ICD-10-CM

## 2020-11-15 DIAGNOSIS — N1832 Chronic kidney disease, stage 3b: Secondary | ICD-10-CM | POA: Insufficient documentation

## 2020-11-15 NOTE — Therapy (Signed)
[facility] [address] [place], [place], [address] Phone: [phone]   Fax:  [phone]  Physical Therapy Treatment  Patient Details  Name: Tamara Osborne MRN: [id number] Date of Birth: 29-Dec-1975 Referring Provider (PT): [name], NP   Encounter Date: 11/15/2020   PT End of Session - 11/15/20 1530     Visit Number 4    Date for PT Re-Evaluation 01/29/21    Authorization Type [employer]    PT Start Time [id number]    PT Stop Time 1345    PT Time Calculation (min) 50 min    Activity Tolerance Patient tolerated treatment well;No increased pain    Behavior During Therapy WFL for tasks assessed/performed             Past Medical History:  Diagnosis Date   Anemia    Anxiety    Bartholin cyst    Cervical cancer ([place])    Constipation    Depression    Deviated septum    Diabetes mellitus without complication ([place])    Difficult intravenous access    used port for 05-09-2019 surgery   Fibroids    History of blood transfusion    2 units given 04-26-2019, has had total of 8 units since jan 2021   History of blood transfusion 1 unit given 05-30-19   iv fluids also given   History of kidney problems    History of radiation therapy 03/23/2019-05/04/2019   Cervical IMRT   Dr Gery Pray   History of radiation therapy 05/09/2019-06/05/2019   vaginal brachytherapy   Dr Gery Pray   History of recent blood transfusion 05/16/2019   2 units given  per dr Pollyann Savoy at cancer center   Hypertension    Neuropathy    both thumbs   Obesity    Palpitations    PONV (postoperative nausea and vomiting)    Prediabetes    Preeclampsia 2006   Sleep apnea    no cpap used insurance would not cover, osa severe per pt   SOB (shortness of breath)    Swallowing difficulty     Past Surgical History:  Procedure Laterality Date   CESAREAN SECTION WITH BILATERAL TUBAL LIGATION  11/10/2004       DILATION AND CURETTAGE OF UTERUS   1997   IR IMAGING GUIDED PORT INSERTION  04/04/2019   OPERATIVE ULTRASOUND N/A 05/09/2019   Procedure: OPERATIVE ULTRASOUND;  Surgeon: Gery Pray, MD;  Location: [facility];  Service: Urology;  Laterality: N/A;   OPERATIVE ULTRASOUND N/A 05/15/2019   Procedure: OPERATIVE ULTRASOUND;  Surgeon: Gery Pray, MD;  Location: [facility];  Service: Urology;  Laterality: N/A;   OPERATIVE ULTRASOUND N/A 05/22/2019   Procedure: OPERATIVE ULTRASOUND;  Surgeon: Gery Pray, MD;  Location[facility];  Service: Urology;  Laterality: N/A;   OPERATIVE ULTRASOUND N/A 05/29/2019   Procedure: OPERATIVE ULTRASOUND;  Surgeon: Gery Pray, MD;  Location: [facility];  Service: Urology;  Laterality: N/A;   OPERATIVE ULTRASOUND N/A 06/05/2019   Procedure: OPERATIVE ULTRASOUND;  Surgeon: Gery Pray, MD;  Location: [facility];  Service: Urology;  Laterality: N/A;   TANDEM RING INSERTION N/A 05/09/2019   Procedure: TANDEM RING INSERTION;  Surgeon: Gery Pray, MD;  Location[facility];  Service: Urology;  Laterality: N/A;   TANDEM RING INSERTION N/A 05/15/2019   Procedure: TANDEM RING INSERTION;  Surgeon: Gery Pray, MD;  Location: [facility];  Service: Urology;  Laterality: N/A;   TANDEM RING INSERTION N/A 05/22/2019   Procedure: TANDEM RING INSERTION;  Surgeon: Gery Pray, MD;  Location: [facility];  Service: Urology;  Laterality: N/A;   TANDEM RING INSERTION N/A 05/29/2019   Procedure: TANDEM RING INSERTION;  Surgeon: Gery Pray, MD;  Location: [facility];  Service: Urology;  Laterality: N/A;   TANDEM RING INSERTION N/A 06/05/2019   Procedure: TANDEM RING INSERTION;  Surgeon: Gery Pray, MD;  Location: [facility];  Service: Urology;  Laterality: N/A;    There were no vitals filed for this visit.   Subjective Assessment - 11/15/20 1529      Subjective I was tired after my first pool visit but it felt great.    Patient Stated Goals reduce urinary leakage    Currently in Pain? No/denies            Treatment: Patient seen for aquatic therapy today.  Treatment took place in water 2.5-4 feet deep depending upon activity.  Pt entered the pool via stairs, step to step with extra time and heavy use of hand rails. Pt requires buoyancy of water for support and to offload joints with strengthening exercises.  Water temp 93 degrees F.  Seated water bench with 75% submersion Pt performed seated LE AROM exercises 20x in all planes, concurrent discussion of status and pain assessment. Blue UE wts for shoulder horizontal add/abd 20x  Standing in 75% submersion: Water walking 6 lengths with emphasis on increasing step length. All directions. Wall exercises: 15x Bil Hip AROm in all planes. Single limb stance no UE 10 sec 3x Bil.Split stance with mitten hand pushing and pulling against the water: Bil 2x 20x each. High knee marching with small noodle 4 lengths. Hip adductor stretch on second step 10 sec 3x Bil.  Yellow noodle seated decompressison float 1 min, then bicycle ( Big ROM ) 30 sec: 3 sets.                               PT Short Term Goals - 11/06/20 1324       PT SHORT TERM GOAL #1   Title independent with initial HEP    Baseline --    Time 4    Period Weeks    Status New    Target Date 12/04/20      PT SHORT TERM GOAL #2   Title educated on vaginal moisturizers to improve tissue integrity    Baseline --    Time 4    Period Weeks    Status New    Target Date 12/04/20      PT SHORT TERM GOAL #3   Title ---    Baseline --               PT Long Term Goals - 11/06/20 1354       PT LONG TERM GOAL #1   Title independent with advanced exercises for strength, flexibility, and endurance    Baseline not educated yet    Time 12    Period Weeks    Status New    Target Date 01/29/21      PT  LONG TERM GOAL #2   Title urinary leakage decreased >/= 75% due to improved pelvic floor strength >/= 3/5 and not holding her breath    Baseline urinary leakage with activity    Time 12  Period Weeks    Status New    Target Date 01/29/21      PT LONG TERM GOAL #3   Title sit to stand </= 11.5 seconds so she feels steadier on her feet    Baseline sit to stand 17 seconds    Time 12    Period Weeks    Status New    Target Date 01/29/21      PT LONG TERM GOAL #4   Title ambulate with increased hip extension increased >/= 5 degrees to reduce risk of falls    Baseline hip extension -20 degrees bilaterally    Time 12    Period Weeks    Status New    Target Date 01/29/21      PT LONG TERM GOAL #5   Title ----    Baseline ---                   Plan - 11/15/20 1530     Clinical Impression Statement Pt tolerated her second aquatic PT session very well. Pt has difficulty balancing on her LTLE more than her RTLE. She does improve with practice. The buoyancy helps steady pt and helps her be successful with all her exercises. No pain.    Personal Factors and Comorbidities Fitness;Comorbidity 3+;Time since onset of injury/illness/exacerbation    Rehab Potential Good    PT Frequency 2x / week    PT Duration 12 weeks    PT Treatment/Interventions ADLs/Self Care Home Management;Biofeedback;Therapeutic activities;Therapeutic exercise;Balance training;Neuromuscular re-education;Patient/family education;Manual techniques;Passive range of motion;Dry needling;Spinal Manipulations;Joint Manipulations;Aquatic Therapy    PT Next Visit Plan when in the pool working on balance, hip  extension and trunk mobility, and core strength; pelvic floor work on manual mobilizaiton of the vaginal tissue, joint mobilization of the hips and pelvic floor contractions    PT Home Exercise Plan Access Code: [facility]             Patient will benefit from skilled therapeutic intervention in order to  improve the following deficits and impairments:     Visit Diagnosis: Cramp and spasm  Other urinary incontinence  Muscle weakness (generalized)  Other abnormalities of gait and mobility     Problem List Patient Active Problem List   Diagnosis Date Noted   Type 2 diabetes mellitus without complication, without long-term current use of insulin ([place]) 11/15/2020   Incontinence of urine in female 09/09/2020   Onychomycosis of toenail 07/31/2020   OSA (obstructive sleep apnea) 07/31/2020   Prediabetes 05/27/2020   Sinus congestion 04/16/2020   Port-A-Cath in place 01/22/2020   Other hyperlipidemia 01/10/2020   Vitamin D deficiency 01/10/2020   Insulin resistance 01/10/2020   Class 3 severe obesity with serious comorbidity and body mass index (BMI) of 40.0 to 44.9 in adult ([place]) 01/10/2020   Obesity, Class II, BMI 35-39.9 09/07/2019   Dehydration 05/30/2019   Peripheral neuropathy due to chemotherapy ([place]) 04/25/2019   Nausea without vomiting 04/18/2019   Diarrhea 04/04/2019   CKD (chronic kidney disease), symptom management only, stage 3 (moderate) ([place]) 03/30/2019   Generalized anxiety disorder 03/30/2019   Hypertension associated with type 2 diabetes mellitus ([place])    Anxiety 03/23/2019   Squamous cell carcinoma of cervix ([place]) 03/23/2019   Deficiency anemia 03/22/2019   Squamous cell carcinoma in situ (SCCIS) of cervix 03/22/2019    Camren Lipsett, PTA 11/15/2020, 3:33 PM  [facility] [address] [place], [place], [address] Phone: [phone]  Fax:  [phone]  Name: Tamara Osborne MRN: [id number] Date of Birth: 16-Oct-1975

## 2020-11-15 NOTE — Progress Notes (Signed)
Chief Complaint:   OBESITY Tamara Osborne is here to discuss her progress with her obesity treatment plan along with follow-up of her obesity related diagnoses. Tamara Osborne is on the Category 2 Plan and states she is following her eating plan approximately 90% of the time. Tamara Osborne states she is doing PT and going to the [facility] for 45-60 minutes 4 times per week.  Today's visit was #: 23 Starting weight: 208 lbs Starting date: 09/20/2019 Today's weight: 224 lbs Today's date: 11/15/2020 Total lbs lost to date: 0 Total lbs lost since last in-office visit: 1 lb  Interim History: Tamara Osborne had a recent appointment with diabetic education - they decreased metformin dose and changed formulation to metformin XR 500 mg daily- managed by PharmD. She has been on Victoza 1.8 mg daily consistently for 1 month- managed by our clinic. She denies mass in neck, dysphagia, dyspepsia, or persistent hoarseness.  Subjective:   1. Type 2 diabetes mellitus without complication, without long-term current use of insulin ([place]) On 10/18/2020, A1c 7.5 - above goal of 7. Tamara Osborne had a recent appointment with diabetic education - they decreased metformin dose and changed formulation to metformin XR 500 mg daily. She has been on Victoza 1.8 mg daily consistently for 1 month- managed by use. She denies mass in neck, dysphagia, dyspepsia, or persistent hoarseness.  Lab Results  Component Value Date   HGBA1C 7.5 (H) 10/18/2020   HGBA1C 6.0 (H) 05/20/2020   HGBA1C 5.2 10/30/2019   Lab Results  Component Value Date   LDLCALC 145 (H) 05/20/2020   CREATININE 1.48 (H) 10/18/2020   Lab Results  Component Value Date   INSULIN 40.3 (H) 09/20/2019   2. Hypertension associated with type 2 diabetes mellitus (HCC) BP/HR stable, very close to goal of 130/80 - today 130/83. She is on Exforge 10/320 mg daily - has been the most successful antihypertensive therapy for her.  She denies acute cardiac symptoms.  BP Readings from  Last 3 Encounters:  11/15/20 130/83  10/23/20 137/88  10/22/20 137/85   Assessment/Plan:   1. Type 2 diabetes mellitus without complication, without long-term current use of insulin (HCC) Continue Victoza 1.8 - managed by this clinic. Continue metformin XR 500 mg daily- managed by diabetic educations/PharmD  2. Hypertension associated with type 2 diabetes mellitus (HCC) Continue Exforge 10/320 mg daily. Limit Na+ in diet.  3. Class 3 severe obesity with serious comorbidity and body mass index (BMI) of 40.0 to 44.9 in adult, unspecified obesity type (HCC)  Tamara Osborne is currently in the action stage of change. As such, her goal is to continue with weight loss efforts. She has agreed to the Category 2 Plan.   Exercise goals:  As is.  Behavioral modification strategies: increasing lean protein intake, decreasing simple carbohydrates, meal planning and cooking strategies, keeping healthy foods in the home, and planning for success.  Sent MyChart message with BG monitoring/CKD information.  Tamara Osborne has agreed to follow-up with our clinic in 4 weeks. She was informed of the importance of frequent follow-up visits to maximize her success with intensive lifestyle modifications for her multiple health conditions.   Objective:   Blood pressure 130/83, pulse 87, temperature 98.3 F (36.8 C), height '5\' 2"'$  (1.575 m), weight 224 lb (101.6 kg), SpO2 97 %. Body mass index is 40.97 kg/m.  General: Cooperative, alert, well developed, in no acute distress. HEENT: Conjunctivae and lids unremarkable. Cardiovascular: Regular rhythm.  Lungs: Normal work of breathing. Neurologic: No focal deficits.   Lab Results  Component Value Date   CREATININE 1.48 (H) 10/18/2020   BUN 20 10/18/2020   NA 139 10/18/2020   K 4.2 10/18/2020   CL 104 10/18/2020   CO2 23 10/18/2020   Lab Results  Component Value Date   ALT 79 (H) 10/18/2020   AST 60 (H) 10/18/2020   ALKPHOS 111 10/18/2020   BILITOT 0.5  10/18/2020   Lab Results  Component Value Date   HGBA1C 7.5 (H) 10/18/2020   HGBA1C 6.0 (H) 05/20/2020   HGBA1C 5.2 10/30/2019   HGBA1C CANCELED 09/20/2019   Lab Results  Component Value Date   INSULIN 40.3 (H) 09/20/2019   Lab Results  Component Value Date   TSH 1.910 09/20/2019   Lab Results  Component Value Date   CHOL 214 (H) 05/20/2020   HDL 32 (L) 05/20/2020   LDLCALC 145 (H) 05/20/2020   TRIG 184 (H) 05/20/2020   CHOLHDL 6.7 05/20/2020   Lab Results  Component Value Date   VD25OH 59.37 10/18/2020   VD25OH 45.60 05/20/2020   VD25OH 69.37 01/22/2020   Lab Results  Component Value Date   WBC 6.6 08/07/2019   HGB 11.8 (L) 08/07/2019   HCT 34.8 (L) 08/07/2019   MCV 87.7 08/07/2019   PLT 267 08/07/2019   Lab Results  Component Value Date   IRON 17 (L) 03/27/2019   TIBC 283 03/27/2019   FERRITIN 32 03/27/2019   Attestation Statements:   Reviewed by clinician on day of visit: allergies, medications, problem list, medical history, surgical history, family history, social history, and previous encounter notes.  Time spent on visit including pre-visit chart review and post-visit care and charting was 28 minutes.   I, Water quality scientist, CMA, am acting as Location manager for Mina Marble, NP.  I have reviewed the above documentation for accuracy and completeness, and I agree with the above. -  Carianne Taira d. Keani Gotcher, NP-C

## 2020-11-22 ENCOUNTER — Other Ambulatory Visit: Payer: Self-pay

## 2020-11-22 ENCOUNTER — Ambulatory Visit: Payer: Medicaid Other | Admitting: Physical Therapy

## 2020-11-22 ENCOUNTER — Encounter: Payer: Self-pay | Admitting: Physical Therapy

## 2020-11-22 DIAGNOSIS — R252 Cramp and spasm: Secondary | ICD-10-CM

## 2020-11-22 DIAGNOSIS — R2689 Other abnormalities of gait and mobility: Secondary | ICD-10-CM

## 2020-11-22 DIAGNOSIS — N39498 Other specified urinary incontinence: Secondary | ICD-10-CM

## 2020-11-22 DIAGNOSIS — M6281 Muscle weakness (generalized): Secondary | ICD-10-CM | POA: Diagnosis not present

## 2020-11-22 NOTE — Therapy (Signed)
[facility] [address] [place], [place], [address] Phone: [phone]   Fax:  [phone]  Physical Therapy Treatment  Patient Details  Name: Tamara Osborne MRN: [id number] Date of Birth: [birth year] Referring Provider (PT): [name], NP   Encounter Date: 11/22/2020   PT End of Session - 11/22/20 1305     Visit Number 5    Date for PT Re-Evaluation 01/29/21    Authorization Type [employer]    PT Start Time 6384    PT Stop Time 1345    PT Time Calculation (min) 42 min    Activity Tolerance Patient tolerated treatment well;No increased pain    Behavior During Therapy WFL for tasks assessed/performed             Past Medical History:  Diagnosis Date   Anemia    Anxiety    Bartholin cyst    Cervical cancer ([place])    Constipation    Depression    Deviated septum    Diabetes mellitus without complication ([place])    Difficult intravenous access    used port for 05-09-2019 surgery   Fibroids    History of blood transfusion    2 units given 04-26-2019, has had total of 8 units since jan 2021   History of blood transfusion 1 unit given 05-30-19   iv fluids also given   History of kidney problems    History of radiation therapy 03/23/2019-05/04/2019   Cervical IMRT   Dr Gery Pray   History of radiation therapy 05/09/2019-06/05/2019   vaginal brachytherapy   Dr Gery Pray   History of recent blood transfusion 05/16/2019   2 units given  per dr Pollyann Savoy at cancer center   Hypertension    Neuropathy    both thumbs   Obesity    Palpitations    PONV (postoperative nausea and vomiting)    Prediabetes    Preeclampsia 2006   Sleep apnea    no cpap used insurance would not cover, osa severe per pt   SOB (shortness of breath)    Swallowing difficulty     Past Surgical History:  Procedure Laterality Date   CESAREAN SECTION WITH BILATERAL TUBAL LIGATION  11/10/2004       DILATION AND CURETTAGE OF UTERUS   1997   IR IMAGING GUIDED PORT INSERTION  04/04/2019   OPERATIVE ULTRASOUND N/A 05/09/2019   Procedure: OPERATIVE ULTRASOUND;  Surgeon: Gery Pray, MD;  Location: [facility];  Service: Urology;  Laterality: N/A;   OPERATIVE ULTRASOUND N/A 05/15/2019   Procedure: OPERATIVE ULTRASOUND;  Surgeon: Gery Pray, MD;  Location: [facility];  Service: Urology;  Laterality: N/A;   OPERATIVE ULTRASOUND N/A 05/22/2019   Procedure: OPERATIVE ULTRASOUND;  Surgeon: Gery Pray, MD;  Location[facility];  Service: Urology;  Laterality: N/A;   OPERATIVE ULTRASOUND N/A 05/29/2019   Procedure: OPERATIVE ULTRASOUND;  Surgeon: Gery Pray, MD;  Location[facility];  Service: Urology;  Laterality: N/A;   OPERATIVE ULTRASOUND N/A 06/05/2019   Procedure: OPERATIVE ULTRASOUND;  Surgeon: Gery Pray, MD;  Location: [facility];  Service: Urology;  Laterality: N/A;   TANDEM RING INSERTION N/A 05/09/2019   Procedure: TANDEM RING INSERTION;  Surgeon: Gery Pray, MD;  Location: [facility];  Service: Urology;  Laterality: N/A;   TANDEM RING INSERTION N/A 05/15/2019   Procedure: TANDEM RING INSERTION;  Surgeon: Gery Pray, MD;  Location: [facility];  Service: Urology;  Laterality: N/A;   TANDEM RING INSERTION N/A 05/22/2019   Procedure: TANDEM RING INSERTION;  Surgeon: Gery Pray, MD;  Location: [facility];  Service: Urology;  Laterality: N/A;   TANDEM RING INSERTION N/A 05/29/2019   Procedure: TANDEM RING INSERTION;  Surgeon: Gery Pray, MD;  Location: [facility];  Service: Urology;  Laterality: N/A;   TANDEM RING INSERTION N/A 06/05/2019   Procedure: TANDEM RING INSERTION;  Surgeon: Gery Pray, MD;  Location: [facility];  Service: Urology;  Laterality: N/A;    There were no vitals filed for this visit.   Subjective Assessment - 11/22/20 1305      Subjective Doing well. Had wisdom teeth out Wednesday. A little sore from that.              Treatment: Patient seen for aquatic therapy today.  Treatment took place in water 2.5-4 feet deep depending upon activity.  Pt entered the pool via stairs step to step with moderate use of hand rails today. Pt requires buoyancy of water for support and to offload joints with strengthening exercises.  Water temp 97 degrees F.  Seated water bench with 75% submersion Pt performed seated LE AROM exercises 20x in all planes, 2# LAB 20x Bil, concurrent review of status  75% depth standing: Water walking 10x each direction, VC to swing her arms more. Single leg stance Bil with small noodle to hold onto 10 sec 2x, then 2x 10 sec without noodle/no UE. Tandem stance with mitten hands flex/ext of arms 20x eachside. Tandem stance hold 1 min while PTA produced current with kickboard. 3 sets on each side. Wall exs inc: Bil hip abd and ext 20x. Underwater bicycle  with large noodle for flotation 1 min work, 1 min rest 3 sets.                              PT Short Term Goals - 11/22/20 1552       PT SHORT TERM GOAL #1   Title independent with initial HEP    Time 4    Period Weeks    Status Achieved    Target Date 12/04/20               PT Long Term Goals - 11/06/20 1354       PT LONG TERM GOAL #1   Title independent with advanced exercises for strength, flexibility, and endurance    Baseline not educated yet    Time 12    Period Weeks    Status New    Target Date 01/29/21      PT LONG TERM GOAL #2   Title urinary leakage decreased >/= 75% due to improved pelvic floor strength >/= 3/5 and not holding her breath    Baseline urinary leakage with activity    Time 12    Period Weeks    Status New    Target Date 01/29/21      PT LONG TERM GOAL #3   Title sit to stand </= 11.5 seconds so she feels steadier on her feet    Baseline sit to stand 17 seconds    Time 12     Period Weeks    Status New    Target Date 01/29/21      PT LONG TERM GOAL #4   Title ambulate with increased hip extension increased >/= 5 degrees to  reduce risk of falls    Baseline hip extension -20 degrees bilaterally    Time 12    Period Weeks    Status New    Target Date 01/29/21      PT LONG TERM GOAL #5   Title ----    Baseline ---                   Plan - 11/22/20 1548     Clinical Impression Statement Pt arrives to aquatic PT today with positive reports. She has been able to transition to her "water aerobics" class, her only limitation is forgetting what exercise to add after learning it with PTA. Pt has short term memory difficulties. Pt had wisdom teeth extration 2 days ago but this did not limit her ability to exercise. Pt was able to demonstrate single leg stance without assistance for 10 seconds and no wobbling. Little to no fatigue noted at conclusion of session.    Personal Factors and Comorbidities Fitness;Comorbidity 3+;Time since onset of injury/illness/exacerbation    Comorbidities cervical cancer with radiation on 03/23/2019-05/04/2019 and 05/09/2019-06/15/2019 ; diabetes; c-section 11/10/2004    Examination-Activity Limitations Locomotion Level;Sleep;Continence;Toileting;Bend    Stability/Clinical Decision Making Evolving/Moderate complexity    PT Frequency 2x / week    PT Treatment/Interventions ADLs/Self Care Home Management;Biofeedback;Therapeutic activities;Therapeutic exercise;Balance training;Neuromuscular re-education;Patient/family education;Manual techniques;Passive range of motion;Dry needling;Spinal Manipulations;Joint Manipulations;Aquatic Therapy    PT Next Visit Plan when in the pool working on balance, hip  extension and trunk mobility, and core strength; pelvic floor work on manual mobilizaiton of the vaginal tissue, joint mobilization of the hips and pelvic floor contractions    PT Home Exercise Plan Access Code: [email or web address]    Consulted and Agree  with Plan of Care Patient             Patient will benefit from skilled therapeutic intervention in order to improve the following deficits and impairments:  Abnormal gait, Decreased coordination, Decreased range of motion, Difficulty walking, Increased fascial restricitons, Increased muscle spasms, Pain, Decreased strength, Decreased mobility, Decreased balance, Impaired flexibility  Visit Diagnosis: Cramp and spasm  Other urinary incontinence  Muscle weakness (generalized)  Other abnormalities of gait and mobility     Problem List Patient Active Problem List   Diagnosis Date Noted   Type 2 diabetes mellitus without complication, without long-term current use of insulin ([place]) 11/15/2020   Incontinence of urine in female 09/09/2020   Onychomycosis of toenail 07/31/2020   OSA (obstructive sleep apnea) 07/31/2020   Prediabetes 05/27/2020   Sinus congestion 04/16/2020   Port-A-Cath in place 01/22/2020   Other hyperlipidemia 01/10/2020   Vitamin D deficiency 01/10/2020   Insulin resistance 01/10/2020   Class 3 severe obesity with serious comorbidity and body mass index (BMI) of 40.0 to 44.9 in adult ([place]) 01/10/2020   Obesity, Class II, BMI 35-39.9 09/07/2019   Dehydration 05/30/2019   Peripheral neuropathy due to chemotherapy ([place]) 04/25/2019   Nausea without vomiting 04/18/2019   Diarrhea 04/04/2019   CKD (chronic kidney disease), symptom management only, stage 3 (moderate) ([place]) 03/30/2019   Generalized anxiety disorder 03/30/2019   Hypertension associated with type 2 diabetes mellitus ([place])    Anxiety 03/23/2019   Squamous cell carcinoma of cervix ([place]) 03/23/2019   Deficiency anemia 03/22/2019   Squamous cell carcinoma in situ (SCCIS) of cervix 03/22/2019    Tamara Osborne, PTA 11/22/2020, 3:52 PM  [facility] [address] [place], [place], [address] Phone: [phone]  Fax:   [phone]  Name: Tamara Osborne MRN: [id number] Date of Birth: [birth year]

## 2020-11-28 ENCOUNTER — Encounter: Payer: Medicaid Other | Attending: Gynecologic Oncology | Admitting: Physical Therapy

## 2020-11-28 ENCOUNTER — Encounter: Payer: Self-pay | Admitting: Physical Therapy

## 2020-11-28 ENCOUNTER — Other Ambulatory Visit: Payer: Self-pay

## 2020-11-28 DIAGNOSIS — N39498 Other specified urinary incontinence: Secondary | ICD-10-CM

## 2020-11-28 DIAGNOSIS — R252 Cramp and spasm: Secondary | ICD-10-CM

## 2020-11-28 DIAGNOSIS — R2689 Other abnormalities of gait and mobility: Secondary | ICD-10-CM | POA: Diagnosis present

## 2020-11-28 DIAGNOSIS — M6281 Muscle weakness (generalized): Secondary | ICD-10-CM | POA: Diagnosis present

## 2020-11-28 NOTE — Patient Instructions (Addendum)
Urge Incontinence  Ideal urination frequency is every 2-4 wakeful hours, which equates to 5-8 times within a 24-hour period.   Urge incontinence is leakage that occurs when the bladder muscle contracts, creating a sudden need to go before getting to the bathroom.   Going too often when your bladder isn't actually full can disrupt the body's automatic signals to store and hold urine longer, which will increase urgency/frequency.  In this case, the bladder "is running the show" and strategies can be learned to retrain this pattern.   One should be able to control the first urge to urinate, at around 121mL.  The bladder can hold up to a "grande latte," or 436mL. To help you gain control, practice the Urge Drill below when urgency strikes.  This drill will help retrain your bladder signals and allow you to store and hold urine longer.  The overall goal is to stretch out your time between voids to reach a more manageable voiding schedule.    Practice your "quick flicks" often throughout the day (each waking hour) even when you don't need feel the urge to go.  This will help strengthen your pelvic floor muscles, making them more effective in controlling leakage.  Urge Drill  When you feel an urge to go, follow these steps to regain control: Stop what you are doing and be still Take one deep breath, directing your air into your abdomen Think an affirming thought, such as "I've got this." Do 5 quick flicks of your pelvic floor Walk with control to the bathroom to void, or delay voiding Start to wait for 1 hour Then og to 1.5 hours then go to 2 hours.   Earlie Counts, PT [facility] [address], [place], [place] [address] W: [phone] [email or web address]  Access Code: [email or web address] URL: [email or web address] Date: 11/28/2020 Prepared by: Earlie Counts  Exercises Supine Bridge - 1 x daily - 7 x weekly - 15 reps Supine Pelvic Floor Contraction - 4 x  daily - 7 x weekly - 1 sets - 5 reps - 5 sec hold Standing Hip Adduction with Resistance - 1 x daily - 3 x weekly - 2 sets - 10 reps Standing Hip Abduction Adduction at [place] - 1 x daily - 7 x weekly - 3 sets - 10 reps Standing March at [place] 1 x daily - 7 x weekly - 3 sets - 10 reps Heel Toe Raises at [place] - 1 x daily - 7 x weekly - 3 sets - 10 reps Standing Hip Circles at [employer] - 1 x daily - 7 x weekly - 3 sets - 10 reps Side Stepping - 1 x daily - 7 x weekly - 3 sets - 10 reps

## 2020-11-28 NOTE — Therapy (Signed)
[place] [facility] [address], [place], [place], [address] Phone: [phone]   Fax:  [phone]  Physical Therapy Treatment  Patient Details  Name: Tamara Osborne MRN: [id number] Date of Birth: 1975-11-07 Referring Provider (PT): [name], NP   Encounter Date: 11/28/2020   PT End of Session - 11/28/20 0927     Visit Number 6    Date for PT Re-Evaluation 01/29/21    Authorization Type [employer] blue    PT Start Time 0830    PT Stop Time 0920    PT Time Calculation (min) 50 min    Activity Tolerance Patient tolerated treatment well;No increased pain    Behavior During Therapy WFL for tasks assessed/performed             Past Medical History:  Diagnosis Date   Anemia    Anxiety    Bartholin cyst    Cervical cancer ([place])    Constipation    Depression    Deviated septum    Diabetes mellitus without complication ([place])    Difficult intravenous access    used port for 05-09-2019 surgery   Fibroids    History of blood transfusion    2 units given 04-26-2019, has had total of 8 units since jan 2021   History of blood transfusion 1 unit given 05-30-19   iv fluids also given   History of kidney problems    History of radiation therapy 03/23/2019-05/04/2019   Cervical IMRT   Dr Gery Pray   History of radiation therapy 05/09/2019-06/05/2019   vaginal brachytherapy   Dr Gery Pray   History of recent blood transfusion 05/16/2019   2 units given  per dr Pollyann Savoy at cancer center   Hypertension    Neuropathy    both thumbs   Obesity    Palpitations    PONV (postoperative nausea and vomiting)    Prediabetes    Preeclampsia 2006   Sleep apnea    no cpap used insurance would not cover, osa severe per pt   SOB (shortness of breath)    Swallowing difficulty     Past Surgical History:  Procedure Laterality Date   CESAREAN SECTION WITH BILATERAL TUBAL LIGATION  11/10/2004       DILATION AND CURETTAGE OF UTERUS   1997   IR IMAGING GUIDED PORT INSERTION  04/04/2019   OPERATIVE ULTRASOUND N/A 05/09/2019   Procedure: OPERATIVE ULTRASOUND;  Surgeon: Gery Pray, MD;  Location: [place];  Service: Urology;  Laterality: N/A;   OPERATIVE ULTRASOUND N/A 05/15/2019   Procedure: OPERATIVE ULTRASOUND;  Surgeon: Gery Pray, MD;  Location: [facility];  Service: Urology;  Laterality: N/A;   OPERATIVE ULTRASOUND N/A 05/22/2019   Procedure: OPERATIVE ULTRASOUND;  Surgeon: Gery Pray, MD;  Location[facility];  Service: Urology;  Laterality: N/A;   OPERATIVE ULTRASOUND N/A 05/29/2019   Procedure: OPERATIVE ULTRASOUND;  Surgeon: Gery Pray, MD;  Location: [facility];  Service: Urology;  Laterality: N/A;   OPERATIVE ULTRASOUND N/A 06/05/2019   Procedure: OPERATIVE ULTRASOUND;  Surgeon: Gery Pray, MD;  Location: [facility];  Service: Urology;  Laterality: N/A;   TANDEM RING INSERTION N/A 05/09/2019   Procedure: TANDEM RING INSERTION;  Surgeon: Gery Pray, MD;  Location: [facility];  Service: Urology;  Laterality: N/A;   TANDEM RING INSERTION N/A 05/15/2019   Procedure: TANDEM RING INSERTION;  Surgeon: Gery Pray, MD;  Location: [facility];  Service: Urology;  Laterality: N/A;   TANDEM RING INSERTION N/A 05/22/2019   Procedure: TANDEM RING INSERTION;  Surgeon: Gery Pray, MD;  Location: [facility];  Service: Urology;  Laterality: N/A;   TANDEM RING INSERTION N/A 05/29/2019   Procedure: TANDEM RING INSERTION;  Surgeon: Gery Pray, MD;  Location: [facility];  Service: Urology;  Laterality: N/A;   TANDEM RING INSERTION N/A 06/05/2019   Procedure: TANDEM RING INSERTION;  Surgeon: Gery Pray, MD;  Location: [facility];  Service: Urology;  Laterality: N/A;    There were no vitals filed for this visit.   Subjective Assessment - 11/28/20 0833      Subjective Th pool is helping me with the balance. I am doing things I am not able to do out of the water. Patient feels the urinary leakage is a little better. When I had the urge ti was able to walk to the bathroom. I go to the bathroom every 45 minutes and just drinking water.    Patient Stated Goals reduce urinary leakage    Pain Score 8     Pain Location Perineum    Pain Orientation Right;Mid    Pain Descriptors / Indicators Pressure    Pain Type Chronic pain    Pain Onset More than a month ago    Pain Frequency Intermittent    Aggravating Factors  feels the pressure the most at the end of urinating; when bladder is full    Pain Relieving Factors after she goes to the bathroom    Multiple Pain Sites No                            Pelvic Floor Special Questions - 11/28/20 0001     Urinary Leakage Yes    Pad use 1 pad per day when goes somewhere and none at home; wears a depends diaper per night    Activities that cause leaking With strong urge;Coughing;Sneezing;Laughing    Urinary urgency Yes    Pelvic Floor Internal Exam Patient confirms identificaiton and approves PT to assess pelvic floor and treatment    Exam Type Vaginal    Strength fair squeeze, definite lift               [facility] Adult PT Treatment/Exercise - 11/28/20 0001       Self-Care   Self-Care Other Self-Care Comments    Other Self-Care Comments  educated patient on the urge to void, how to deter her bladder and thingk of something else and start with 1 hour      Neuro Re-ed    Neuro Re-ed Details  tactile cues to the pelvic floor with tapping to contract the circular      Knee/Hip Exercises: Standing   Hip ADduction Strengthening;Right;Left;1 set;15 reps   red theraband   Hip ADduction Limitations with quick pelvic floor contraction to strengthen the lateral walls      Manual Therapy   Manual Therapy Internal Pelvic Floor    Internal Pelvic Floor manual work to the levator ani, along  the introitus externally and internally to rellease the fascia                     PT Education - 11/28/20 0925     Education Details Access Code: [email or web address]; urge to void; gave patient pictures of pool exercises due to her having trouble remembering  Person(s) Educated Patient    Methods Explanation;Demonstration;Handout    Comprehension Returned demonstration;Verbalized understanding              PT Short Term Goals - 11/22/20 1552       PT SHORT TERM GOAL #1   Title independent with initial HEP    Time 4    Period Weeks    Status Achieved    Target Date 12/04/20               PT Long Term Goals - 11/06/20 1354       PT LONG TERM GOAL #1   Title independent with advanced exercises for strength, flexibility, and endurance    Baseline not educated yet    Time 12    Period Weeks    Status New    Target Date 01/29/21      PT LONG TERM GOAL #2   Title urinary leakage decreased >/= 75% due to improved pelvic floor strength >/= 3/5 and not holding her breath    Baseline urinary leakage with activity    Time 12    Period Weeks    Status New    Target Date 01/29/21      PT LONG TERM GOAL #3   Title sit to stand </= 11.5 seconds so she feels steadier on her feet    Baseline sit to stand 17 seconds    Time 12    Period Weeks    Status New    Target Date 01/29/21      PT LONG TERM GOAL #4   Title ambulate with increased hip extension increased >/= 5 degrees to reduce risk of falls    Baseline hip extension -20 degrees bilaterally    Time 12    Period Weeks    Status New    Target Date 01/29/21      PT LONG TERM GOAL #5   Title ----    Baseline ---                   Plan - 11/28/20 3500     Clinical Impression Statement Patient pelvic floor muscles are more supple. She is able to do a full circular contraction with lift now. Pelvic floor strength is now 3/5 but till has some difficulty relaxing the pelvvic floor. Patient reports  she will go to the bathroom every 45 minutes so she has learned the urge to void suprression to deter and will work toward urinating every hour. Patient feels her balance is better. She will wear 1 dependes day and night. Patient  will leak with cough, sneeze and laugh. Patient will benefit from skilled therapy to improve strength for reduction of leakage and balance.    Personal Factors and Comorbidities Fitness;Comorbidity 3+;Time since onset of injury/illness/exacerbation    Comorbidities cervical cancer with radiation on 03/23/2019-05/04/2019 and 05/09/2019-06/15/2019 ; diabetes; c-section 11/10/2004    Examination-Activity Limitations Locomotion Level;Sleep;Continence;Toileting;Bend    Examination-Participation Restrictions Community Activity    Rehab Potential Good    PT Frequency 2x / week   for a total of 12 visitis within the 12 week time frame due to the therapist schedule   PT Duration 12 weeks    PT Treatment/Interventions ADLs/Self Care Home Management;Biofeedback;Therapeutic activities;Therapeutic exercise;Balance training;Neuromuscular re-education;Patient/family education;Manual techniques;Passive range of motion;Dry needling;Spinal Manipulations;Joint Manipulations;Aquatic Therapy    PT Next Visit Plan when in the pool working on balance, hip  extension and trunk mobility, and core strength; pelvic floor work on  manual mobilizaiton of the vaginal tissue, joint mobilization of the hips and pelvic floor contractions    PT Home Exercise Plan Access Code: [email or web address]    Consulted and Agree with Plan of Care Patient             Patient will benefit from skilled therapeutic intervention in order to improve the following deficits and impairments:  Abnormal gait, Decreased coordination, Decreased range of motion, Difficulty walking, Increased fascial restricitons, Increased muscle spasms, Pain, Decreased strength, Decreased mobility, Decreased balance, Impaired flexibility  Visit  Diagnosis: Cramp and spasm  Other urinary incontinence  Other abnormalities of gait and mobility  Muscle weakness (generalized)     Problem List Patient Active Problem List   Diagnosis Date Noted   Type 2 diabetes mellitus without complication, without long-term current use of insulin ([place]) 11/15/2020   Incontinence of urine in female 09/09/2020   Onychomycosis of toenail 07/31/2020   OSA (obstructive sleep apnea) 07/31/2020   Prediabetes 05/27/2020   Sinus congestion 04/16/2020   Port-A-Cath in place 01/22/2020   Other hyperlipidemia 01/10/2020   Vitamin D deficiency 01/10/2020   Insulin resistance 01/10/2020   Class 3 severe obesity with serious comorbidity and body mass index (BMI) of 40.0 to 44.9 in adult ([place]) 01/10/2020   Obesity, Class II, BMI 35-39.9 09/07/2019   Dehydration 05/30/2019   Peripheral neuropathy due to chemotherapy ([place]) 04/25/2019   Nausea without vomiting 04/18/2019   Diarrhea 04/04/2019   CKD (chronic kidney disease), symptom management only, stage 3 (moderate) ([place]) 03/30/2019   Generalized anxiety disorder 03/30/2019   Hypertension associated with type 2 diabetes mellitus ([place])    Anxiety 03/23/2019   Squamous cell carcinoma of cervix ([place]) 03/23/2019   Deficiency anemia 03/22/2019   Squamous cell carcinoma in situ (SCCIS) of cervix 03/22/2019    Earlie Counts, PT 11/28/20 9:32 AM   [place] [facility] [address] [place], [place], [address] Phone: [phone]   Fax:  [phone]  Name: Tamara Osborne MRN: [id number] Date of Birth: 25-Sep-1975

## 2020-11-29 ENCOUNTER — Encounter: Payer: Self-pay | Admitting: Radiology

## 2020-11-29 ENCOUNTER — Ambulatory Visit: Payer: Medicaid Other | Admitting: Physical Therapy

## 2020-12-03 ENCOUNTER — Ambulatory Visit: Payer: Medicaid Other | Attending: Family Medicine | Admitting: Pharmacist

## 2020-12-03 ENCOUNTER — Other Ambulatory Visit: Payer: Self-pay

## 2020-12-03 DIAGNOSIS — E119 Type 2 diabetes mellitus without complications: Secondary | ICD-10-CM | POA: Diagnosis not present

## 2020-12-03 NOTE — Patient Instructions (Signed)
Thank you for coming to see me today. Please do the following:  Continue metformin once a day. Continue Victoza once a day.  Continue checking blood sugars at home. .  Continue making the lifestyle changes we've discussed together during our visit. Diet and exercise play a significant role in improving your blood sugars.  Follow-up with me in 1 month.  Please ask the cancer center to order [id number] at your lab visit next week.

## 2020-12-03 NOTE — Progress Notes (Signed)
    S:     No chief complaint on file.   Patient arrives good spirits.  Presents for diabetes evaluation, education, and management. Patient was referred and last seen by Primary Care Provider on 10/23/2020. We saw her on 11/14/2020 and changed metformin 1000 mg BID to 500 mg XR daily d/t renal function.   Patient was diagnosed with DM on 10/22/2020. Pt reports she had been prediabetic for years. She denies N/V or abdominal pain. Denies diarrhea since addition of metformin 500 mg XR. Reports that she is feeling better overall.   Family/Social History:  -FHx: HTN, cancer, depression, anxiety, DM, CKD, HF -Tobacco: none  Insurance coverage/medication affordability: [place] [employer]  Medication adherence reported.  Current diabetes medications include: metformin 500 mg XR, Victoza 1.8 mg daily  Current hypertension medications include: amlodipine-valsartan 10-320 mg daily  Current hyperlipidemia medications include: none  Patient denies hypoglycemic events.  Patient reported dietary habits:  -Patient is engaged with Healthy Weight [facility] Management  -Following their nutrition program   Patient-reported exercise habits:  -PT 2-3 x weekly -Water aerobics 2-3x weekly    Patient reports polyuria and neuropathy. However, she relates this to effects from prior chemo/radiation.  Patient endorses visual changes but relates these to variances in her blood pressure.  Patient denies self foot exams.     O:  POCT: 97  Lab Results  Component Value Date   HGBA1C 7.5 (H) 10/18/2020   There were no vitals filed for this visit.  Lipid Panel     Component Value Date/Time   CHOL 214 (H) 05/20/2020 1415   CHOL 222 (H) 09/20/2019 1106   TRIG 184 (H) 05/20/2020 1415   HDL 32 (L) 05/20/2020 1415   HDL 34 (L) 09/20/2019 1106   CHOLHDL 6.7 05/20/2020 1415   VLDL 37 05/20/2020 1415   LDLCALC 145 (H) 05/20/2020 1415   LDLCALC 158 (H) 09/20/2019 1106    Home fasting blood sugars: no meter with her.  Gives range 84 - 121.   Clinical Atherosclerotic Cardiovascular Disease (ASCVD): No  The 10-year ASCVD risk score (Arnett DK, et al., 2019) is: 5.5%   Values used to calculate the score:     Age: 45 years     Sex: Female     Is Non-Hispanic African American: No     Diabetic: Yes     Tobacco smoker: No     Systolic Blood Pressure: 694 mmHg     Is BP treated: Yes     HDL Cholesterol: 32 mg/dL     Total Cholesterol: 214 mg/dL    A/P: Diabetes newly diagnosed on 10/22/2020. Patient reports medication adherence and is tolerating newly-added metformin. Continues to tolerate Victoza well. She has difficulty with venous access but is going to the [facility] next week for labs. Will follow GFR closely given metformin use.  -Continued metformin XR 500 mg daily  -Continued  Victoza 1.8 mg daily -Extensively discussed pathophysiology of diabetes, recommended lifestyle interventions, dietary effects on blood sugar control -Next A1c anticipated 12/2020.   Written patient instructions provided.  Total time in face to face counseling 30 minutes.   Follow up Pharmacist Clinic Visit in 1 month.  Benard Halsted, PharmD, [facility], [place] [phone]

## 2020-12-04 NOTE — Progress Notes (Signed)
Radiation Oncology         [phone] ________________________________  Name: Tamara Osborne MRN: [id number]  Date: 12/05/2020  DOB: 07-27-1975  Follow-Up Visit Note  CC: Kerin Perna, NP  Pllc, [facility]    ICD-10-CM   1. Squamous cell carcinoma of cervix (HCC)  C53.9       Diagnosis: Stage II-B squamous cell carcinoma of the cervix  Interval Since Last Radiation:  1 year 5 months and 1 day  Radiation Treatment Dates: 03/23/2019 through 06/05/2019    External beam therapy was from 03/23/2019 - 05/04/2019. HDR vaginal brachytherapy was from 05/19/2019 - 06/05/2019.   Site Technique Total Dose (Gy) Dose per Fx (Gy) Completed Fx Beam Energies  Cervix: Cervix HDR-brachy 5.5/5.5 5.5 1/1 Ir-192  Cervix: Cervix HDR-brachy 5.5/5.5 5.5 1/1 Ir-192  Cervix: Cervix_Bst HDR-brachy 5.5/5.5 5.5 1/1 Ir-192  Cervix: Cervix HDR-brachy 5.5/5.5 5.5 1/1 Ir-192  Cervix: Cervix 3D 45/45 1.8 25/25 10X, 15X  Cervix: Cervix HDR-brachy 5.5/5.5 5.5 1/1 Ir-192  Cervix: Cervix_Bst 3D 9/9 1.8 5/5 15X    Narrative:  The patient returns today for routine follow-up.  Since her last visit, the patient followed up with Dr. Berline Lopes on 10/16/20 to receive Pap smear results collected on 09/06/20. Findings from Pap were ASCUS and HPV negative. During visit on 08/17, the patient was noted to be doing well overall other than reporting some pain and discomfort with intercourse and while using her vaginal dilator (with occasional spotting when using vaginal dilator). The patient reported using coconut oil or a lubricant that her pelvic PT told her about. Physical exam performed during this visit showed no evidence concerning for disease recurrence.    The patient completed her chemotherapy treatment on 10/18/20. Dr. Alvy Bimler noted the patient to have significant improvement of her neuropathy, and referred the patient to PT for rehab for her generalized muscle weakness.                         Pertinent  imaging since the patient was last seen includes a bilateral screening mammogram on 09/19/20 which demonstrated no evidence of malignancy in either breast.    Patient reports that her pelvic physical therapy is helping her urinary symptoms as well as some issues with discomfort using the vaginal dilator and with sexual intercourse.  She occasionally will notice some mild spotting after using her vaginal dilator.  She is using her dilator 2-3 times per week as recommended.   Allergies:  is allergic to penicillins, shellfish allergy, and sulfa antibiotics.  Meds: Current Outpatient Medications  Medication Sig Dispense Refill   Accu-Chek Softclix Lancets lancets Use to check blood sugar TID. 100 each 2   amLODipine-valsartan (EXFORGE) 10-320 MG tablet Take 1 tablet by mouth daily. 90 tablet 0   ASHWAGANDHA PO Take by mouth at bedtime. Blue [place] Gummies     Blood Glucose Monitoring Suppl (ACCU-CHEK GUIDE) w/Device KIT Use to check blood sugar TID. 1 kit 0   chlorhexidine (PERIDEX) 0.12 % solution SMARTSIG:By Mouth     Cholecalciferol (VITAMIN D3) 50 MCG (2000 UT) TABS Take 2,000 Units by mouth daily.     CLONIDINE HCL PO Take 0.1 mg by mouth in the morning and at bedtime. Pending BP readings     glucose blood (ACCU-CHEK GUIDE) test strip Use to check blood sugar TID. 100 each 2   Insulin Pen Needle (BD PEN NEEDLE NANO 2ND GEN) 32G X 4 MM MISC 1 Package  by Does not apply route 2 (two) times daily. (Patient taking differently: 1 Package by Does not apply route daily after supper.) 100 each 0   liraglutide (VICTOZA) 18 MG/3ML SOPN Inject 1.8 mg into the skin daily. 9 mL 0   metFORMIN (GLUCOPHAGE-XR) 500 MG 24 hr tablet Take 1 tablet (500 mg total) by mouth daily with breakfast. 90 tablet 1   mupirocin ointment (BACTROBAN) 2 % 1 application 3 (three) times daily.     fluticasone (FLONASE) 50 MCG/ACT nasal spray Place 1 spray into both nostrils daily as needed for allergies or rhinitis. (Patient not  taking: Reported on 12/05/2020)     lidocaine-prilocaine (EMLA) cream Apply 1 application topically as needed. Apply to port access daily as needed 30 g 3   No current facility-administered medications for this encounter.    Physical Findings: The patient is in no acute distress. Patient is alert and oriented.  height is 5' 2"  (1.575 m) and weight is 222 lb 3.2 oz (100.8 kg). Her temperature is 97.6 F (36.4 C). Her blood pressure is 138/82 and her pulse is 96. Her respiration is 20 and oxygen saturation is 100%. .  No significant changes. Lungs are clear to auscultation bilaterally. Heart has regular rate and rhythm. No palpable cervical, supraclavicular, or axillary adenopathy. Abdomen soft, non-tender, normal bowel sounds.  Pelvic examination the external genitalia are unremarkable.  A speculum exam is performed.  There are no mucosal lesions noted in the vaginal vault.  The cervical os is flush with the upper vaginal area.  Some radiation changes are noted in this area.  This tissue is somewhat fragile and bleeds slightly with exam.  On bimanual and rectovaginal examination there are no pelvic masses appreciated.  Rectal sphincter tone slightly decreased.   Lab Findings: Lab Results  Component Value Date   WBC 6.6 08/07/2019   HGB 11.8 (L) 08/07/2019   HCT 34.8 (L) 08/07/2019   MCV 87.7 08/07/2019   PLT 267 08/07/2019    Radiographic Findings: No results found.  Impression:  Stage II-B squamous cell carcinoma of the cervix  No evidence of recurrence on clinical exam today.  Patient's symptoms are generally improving overall and she is pleased with this issue.  As above physical therapy is helping with her symptoms.  Plan: Patient will meet with Dr. Berline Lopes in 3 months.  Radiation oncology in 6 months.   22 minutes of total time was spent for this patient encounter, including preparation, face-to-face counseling with the patient and coordination of care, physical exam, and  documentation of the encounter. ____________________________________  Blair Promise, PhD, MD   This document serves as a record of services personally performed by Gery Pray, MD. It was created on his behalf by Roney Mans, a trained medical scribe. The creation of this record is based on the scribe's personal observations and the provider's statements to them. This document has been checked and approved by the attending provider.

## 2020-12-05 ENCOUNTER — Telehealth: Payer: Self-pay | Admitting: *Deleted

## 2020-12-05 ENCOUNTER — Telehealth: Payer: Self-pay

## 2020-12-05 ENCOUNTER — Ambulatory Visit
Admission: RE | Admit: 2020-12-05 | Discharge: 2020-12-05 | Disposition: A | Discharge status: Home / Self Care | Payer: Medicaid Other | Source: Ambulatory Visit | Attending: Radiation Oncology | Admitting: Radiation Oncology

## 2020-12-05 ENCOUNTER — Other Ambulatory Visit: Payer: Self-pay

## 2020-12-05 ENCOUNTER — Encounter: Payer: Self-pay | Admitting: Physical Therapy

## 2020-12-05 ENCOUNTER — Encounter: Payer: Medicaid Other | Attending: Gynecologic Oncology | Admitting: Physical Therapy

## 2020-12-05 ENCOUNTER — Encounter: Payer: Self-pay | Admitting: Radiation Oncology

## 2020-12-05 VITALS — BP 138/82 | HR 96 | Temp 97.6°F | Resp 20 | Ht 62.0 in | Wt 222.2 lb

## 2020-12-05 DIAGNOSIS — Z8541 Personal history of malignant neoplasm of cervix uteri: Secondary | ICD-10-CM | POA: Insufficient documentation

## 2020-12-05 DIAGNOSIS — R252 Cramp and spasm: Secondary | ICD-10-CM

## 2020-12-05 DIAGNOSIS — G629 Polyneuropathy, unspecified: Secondary | ICD-10-CM | POA: Diagnosis not present

## 2020-12-05 DIAGNOSIS — Z923 Personal history of irradiation: Secondary | ICD-10-CM | POA: Insufficient documentation

## 2020-12-05 DIAGNOSIS — N39498 Other specified urinary incontinence: Secondary | ICD-10-CM | POA: Diagnosis not present

## 2020-12-05 DIAGNOSIS — Z7985 Long-term (current) use of injectable non-insulin antidiabetic drugs: Secondary | ICD-10-CM | POA: Diagnosis not present

## 2020-12-05 DIAGNOSIS — R2689 Other abnormalities of gait and mobility: Secondary | ICD-10-CM

## 2020-12-05 DIAGNOSIS — M6281 Muscle weakness (generalized): Secondary | ICD-10-CM

## 2020-12-05 DIAGNOSIS — C539 Malignant neoplasm of cervix uteri, unspecified: Secondary | ICD-10-CM

## 2020-12-05 MED ORDER — LIDOCAINE-PRILOCAINE 2.5-2.5 % EX CREA: 1.0000 "application " | TOPICAL_CREAM | CUTANEOUS | 3 refills | Status: DC | PRN

## 2020-12-05 NOTE — Telephone Encounter (Signed)
CALLED PATIENT TO INFORM OF 6 MONTH FU WITH DR. KINARD ON 06-09-21 @ 10:15 AM, LVM FOR A RETURN CALL

## 2020-12-05 NOTE — Therapy (Signed)
[place] [facility] [address] [place], [place], [address] Phone: [phone]   Fax:  [phone]  Physical Therapy Treatment  Patient Details  Name: Tamara Osborne MRN: [id number] Date of Birth: Feb 26, 1976 Referring Provider (PT): [name], NP   Encounter Date: 12/05/2020   PT End of Session - 12/05/20 1348     Visit Number 7    Date for PT Re-Evaluation 01/29/21    Authorization Type [employer] blue    PT Start Time 1400    PT Stop Time 1445    PT Time Calculation (min) 45 min    Activity Tolerance Patient tolerated treatment well;No increased pain    Behavior During Therapy WFL for tasks assessed/performed             Past Medical History:  Diagnosis Date   Anemia    Anxiety    Bartholin cyst    Cervical cancer ([place])    Chronic kidney disease    Constipation    Depression    Deviated septum    Diabetes mellitus without complication ([place])    Difficult intravenous access    used port for 05-09-2019 surgery   Fibroids    History of blood transfusion    2 units given 04-26-2019, has had total of 8 units since jan 2021   History of blood transfusion 1 unit given 05-30-19   iv fluids also given   History of kidney problems    History of radiation therapy 03/23/2019-05/04/2019   Cervical external beam   Dr Gery Pray   History of radiation therapy 05/09/2019-06/05/2019   vaginal brachytherapy   Dr Gery Pray   History of recent blood transfusion 05/16/2019   2 units given  per dr Pollyann Savoy at cancer center   Hypertension    Neuropathy    both thumbs   Obesity    Palpitations    PONV (postoperative nausea and vomiting)    Prediabetes    Preeclampsia 2006   Sleep apnea    no cpap used insurance would not cover, osa severe per pt   SOB (shortness of breath)    Swallowing difficulty     Past Surgical History:  Procedure Laterality Date   CESAREAN SECTION WITH BILATERAL TUBAL LIGATION  11/10/2004        DILATION AND CURETTAGE OF UTERUS  1997   IR IMAGING GUIDED PORT INSERTION  04/04/2019   OPERATIVE ULTRASOUND N/A 05/09/2019   Procedure: OPERATIVE ULTRASOUND;  Surgeon: Gery Pray, MD;  Location: [facility];  Service: Urology;  Laterality: N/A;   OPERATIVE ULTRASOUND N/A 05/15/2019   Procedure: OPERATIVE ULTRASOUND;  Surgeon: Gery Pray, MD;  Location: [facility];  Service: Urology;  Laterality: N/A;   OPERATIVE ULTRASOUND N/A 05/22/2019   Procedure: OPERATIVE ULTRASOUND;  Surgeon: Gery Pray, MD;  Location: [facility];  Service: Urology;  Laterality: N/A;   OPERATIVE ULTRASOUND N/A 05/29/2019   Procedure: OPERATIVE ULTRASOUND;  Surgeon: Gery Pray, MD;  Location: [facility];  Service: Urology;  Laterality: N/A;   OPERATIVE ULTRASOUND N/A 06/05/2019   Procedure: OPERATIVE ULTRASOUND;  Surgeon: Gery Pray, MD;  Location: [facility];  Service: Urology;  Laterality: N/A;   TANDEM RING INSERTION N/A 05/09/2019   Procedure: TANDEM RING INSERTION;  Surgeon: Gery Pray, MD;  Location: [facility];  Service: Urology;  Laterality: N/A;   TANDEM RING INSERTION N/A 05/15/2019   Procedure: TANDEM RING INSERTION;  Surgeon:  Gery Pray, MD;  Location: [facility];  Service: Urology;  Laterality: N/A;   TANDEM RING INSERTION N/A 05/22/2019   Procedure: TANDEM RING INSERTION;  Surgeon: Gery Pray, MD;  Location: [facility];  Service: Urology;  Laterality: N/A;   TANDEM RING INSERTION N/A 05/29/2019   Procedure: TANDEM RING INSERTION;  Surgeon: Gery Pray, MD;  Location: [facility];  Service: Urology;  Laterality: N/A;   TANDEM RING INSERTION N/A 06/05/2019   Procedure: TANDEM RING INSERTION;  Surgeon: Gery Pray, MD;  Location: [facility];  Service: Urology;  Laterality: N/A;    There were no vitals filed for this visit.   Subjective  Assessment - 12/05/20 1308     Subjective I have lost 7 pounds. I had my 6 month check with oncologist. The saw how well she is walking. Patient has not had the severe pelvic pain for a long time. Patient is able to feel the heel of her foot now.    Patient Stated Goals reduce urinary leakage    Currently in Pain? No/denies                [facility] PT Assessment - 12/05/20 0001       Assessment   Medical Diagnosis N39.498 [place] urinary incontinence    Referring Provider (PT) [name], NP    Onset Date/Surgical Date 08/30/20    Prior Therapy yes and ended on last visit on 05/16/2020      Precautions   Precautions Other (comment)    Precaution Comments cervical cancer with radiation      Restrictions   Weight Bearing Restrictions No      [place] residence      Prior Function   Level of Independence Independent    Leisure water aerobics      Cognition   Overall Cognitive Status Within Functional Limits for tasks assessed      PROM   Right Hip Extension 10    Right Hip ABduction 30    Left Hip Extension 10    Left Hip ABduction 30                           [facility] Adult PT Treatment/Exercise - 12/05/20 0001       Lumbar Exercises: Stretches   Other Lumbar Stretch Exercise assisted stretch of the hip flexors  abd hip adductors      Lumbar Exercises: Supine   Bridge 15 reps    Bridge Limitations to increase hip extension and exercise the gluteals      Knee/Hip Exercises: Standing   Functional Squat 1 set;10 reps    Functional Squat Limitations focusin g with deeping feet apart and increasing hip flexion    Other Standing Knee Exercises large step with heel toe pattern to work o nbalance and coordination and use the gluteal since we havve improved the contraction      Knee/Hip Exercises: Sidelying   Hip ABduction Strengthening;AAROM;Right;Left;2 sets;10 reps    Hip ABduction Limitations therapist assisting to  keep hip in alignment      Knee/Hip Exercises: Prone   Hip Extension Strengthening;AAROM;Right;Left;2 sets;10 reps      Manual Therapy   Manual Therapy Soft tissue mobilization    Soft tissue mobilization using the addaday to the quads, hip flexors, and hip addutors  PT Short Term Goals - 12/05/20 1352       PT SHORT TERM GOAL #1   Title independent with initial HEP    Time 4    Period Weeks    Status Achieved      PT SHORT TERM GOAL #2   Title educated on vaginal moisturizers to improve tissue integrity    Time 4    Period Weeks    Status Achieved               PT Long Term Goals - 11/06/20 1354       PT LONG TERM GOAL #1   Title independent with advanced exercises for strength, flexibility, and endurance    Baseline not educated yet    Time 12    Period Weeks    Status New    Target Date 01/29/21      PT LONG TERM GOAL #2   Title urinary leakage decreased >/= 75% due to improved pelvic floor strength >/= 3/5 and not holding her breath    Baseline urinary leakage with activity    Time 12    Period Weeks    Status New    Target Date 01/29/21      PT LONG TERM GOAL #3   Title sit to stand </= 11.5 seconds so she feels steadier on her feet    Baseline sit to stand 17 seconds    Time 12    Period Weeks    Status New    Target Date 01/29/21      PT LONG TERM GOAL #4   Title ambulate with increased hip extension increased >/= 5 degrees to reduce risk of falls    Baseline hip extension -20 degrees bilaterally    Time 12    Period Weeks    Status New    Target Date 01/29/21      PT LONG TERM GOAL #5   Title ----    Baseline ---                   Plan - 12/05/20 1348     Clinical Impression Statement Patient went to see her gynecologist and did an internal exam so therapist did not do internal work. Patient has lost 7 pounds. She is now able to feel her heel so worked on large steps with heel toe gait to  improve blance. Work on increasing hip extension and abduction to day while strengthening in the new range. She has improved bilateral hip extension and abduction just needs some assistance to move through the end range. Patient continues to improve her urinary leakage. She has more awarensess of her contracting he rpelvic floor. She is using cocnut oil internall to imporve the health of the vaginal tissue. Patient will benefit from skilled therapy to improve strength for reduction of leakage and balance.    Personal Factors and Comorbidities Fitness;Comorbidity 3+;Time since onset of injury/illness/exacerbation    Comorbidities cervical cancer with radiation on 03/23/2019-05/04/2019 and 05/09/2019-06/15/2019 ; diabetes; c-section 11/10/2004    Examination-Activity Limitations Locomotion Level;Sleep;Continence;Toileting;Bend    Examination-Participation Restrictions Community Activity    Stability/Clinical Decision Making Evolving/Moderate complexity    Rehab Potential Good    PT Frequency 2x / week   total of 12 visits in a 12 week time frame due to the therapist schedule   PT Duration 12 weeks    PT Treatment/Interventions ADLs/Self Care Home Management;Biofeedback;Therapeutic activities;Therapeutic exercise;Balance training;Neuromuscular re-education;Patient/family education;Manual techniques;Passive range of motion;Dry needling;Spinal Manipulations;Joint  Manipulations;Aquatic Therapy    PT Next Visit Plan when in the pool working on balance, hip  extension and trunk mobility, and core strength; pelvic floor work on manual mobilizaiton of the vaginal tissue, joint mobilization of the hips and pelvic floor contractions    PT Home Exercise Plan Access Code: [email or web address]    Consulted and Agree with Plan of Care Patient             Patient will benefit from skilled therapeutic intervention in order to improve the following deficits and impairments:  Abnormal gait, Decreased coordination, Decreased range of  motion, Difficulty walking, Increased fascial restricitons, Increased muscle spasms, Pain, Decreased strength, Decreased mobility, Decreased balance, Impaired flexibility  Visit Diagnosis: Cramp and spasm  Other urinary incontinence  Other abnormalities of gait and mobility  Muscle weakness (generalized)     Problem List Patient Active Problem List   Diagnosis Date Noted   Type 2 diabetes mellitus without complication, without long-term current use of insulin ([place]) 11/15/2020   Incontinence of urine in female 09/09/2020   Onychomycosis of toenail 07/31/2020   OSA (obstructive sleep apnea) 07/31/2020   Prediabetes 05/27/2020   Sinus congestion 04/16/2020   Port-A-Cath in place 01/22/2020   Other hyperlipidemia 01/10/2020   Vitamin D deficiency 01/10/2020   Insulin resistance 01/10/2020   Class 3 severe obesity with serious comorbidity and body mass index (BMI) of 40.0 to 44.9 in adult ([place]) 01/10/2020   Obesity, Class II, BMI 35-39.9 09/07/2019   Dehydration 05/30/2019   Peripheral neuropathy due to chemotherapy ([place]) 04/25/2019   Nausea without vomiting 04/18/2019   Diarrhea 04/04/2019   CKD (chronic kidney disease), symptom management only, stage 3 (moderate) ([place]) 03/30/2019   Generalized anxiety disorder 03/30/2019   Hypertension associated with type 2 diabetes mellitus ([place])    Anxiety 03/23/2019   Squamous cell carcinoma of cervix ([place]) 03/23/2019   Deficiency anemia 03/22/2019   Squamous cell carcinoma in situ (SCCIS) of cervix 03/22/2019    Earlie Counts, PT 12/05/20 1:54 PM   [facility] [address] [place], [place], [address] Phone: [phone]   Fax:  [phone]  Name: Tamara Osborne MRN: [id number] Date of Birth: 02-Mar-1976

## 2020-12-05 NOTE — Progress Notes (Signed)
Tamara Osborne is here today for follow up post radiation to the pelvic.  They completed their radiation on: 06/05/2019   Does the patient complain of any of the following:  Pain: occasional abdominal pain, pain with intercourse Abdominal bloating: occasionally when constipated Diarrhea/Constipation: constipation, occasional diarrhea Nausea/Vomiting: mild nausea occasionally Vaginal Discharge: denies Blood in Urine or Stool: denies Urinary Issues (dysuria/incomplete emptying/ incontinence/ increased frequency/urgency): urinary leakage, urgency, frequency, stress incontinence Does patient report using vaginal dilator 2-3 times a week and/or sexually active 2-3 weeks: yes but has pain when removing dilator and during intercourse   Additional comments if applicable: pedal neuropathy  Vitals:   12/05/20 1111  BP: 138/82  Pulse: 96  Resp: 20  Temp: 97.6 F (36.4 C)  SpO2: 100%  Weight: 222 lb 3.2 oz (100.8 kg)  Height: 5\' 2"  (1.575 m)

## 2020-12-05 NOTE — Telephone Encounter (Signed)
Returned her call. She is concerned about kidney function and taking Metformin. Asking if Dr. Alvy Bimler is okay with her taking the Metformin. Per Dr. Alvy Bimler, kidney function is only borderline abnormal. She needs to drink lots of water. She verbalized understanding.

## 2020-12-06 ENCOUNTER — Ambulatory Visit: Payer: Medicaid Other | Attending: Gynecologic Oncology | Admitting: Physical Therapy

## 2020-12-06 ENCOUNTER — Encounter: Payer: Self-pay | Admitting: Physical Therapy

## 2020-12-06 DIAGNOSIS — R252 Cramp and spasm: Secondary | ICD-10-CM | POA: Diagnosis not present

## 2020-12-06 DIAGNOSIS — M6281 Muscle weakness (generalized): Secondary | ICD-10-CM | POA: Diagnosis present

## 2020-12-06 DIAGNOSIS — N39498 Other specified urinary incontinence: Secondary | ICD-10-CM | POA: Insufficient documentation

## 2020-12-06 DIAGNOSIS — R2689 Other abnormalities of gait and mobility: Secondary | ICD-10-CM | POA: Diagnosis present

## 2020-12-06 NOTE — Therapy (Signed)
[facility] @ [place], [place], [address] Phone: [phone]   Fax:  [phone]  Physical Therapy Treatment  Patient Details  Name: Tamara Osborne MRN: [id number] Date of Birth: 31-May-1975 Referring Provider (PT): [name], NP   Encounter Date: 12/06/2020   PT End of Session - 12/06/20 1429     Visit Number 8    Date for PT Re-Evaluation 01/29/21    Authorization Type [employer]    PT Start Time 1340    PT Stop Time 1430    PT Time Calculation (min) 50 min    Activity Tolerance Patient tolerated treatment well;No increased pain    Behavior During Therapy WFL for tasks assessed/performed             Past Medical History:  Diagnosis Date   Anemia    Anxiety    Bartholin cyst    Cervical cancer ([place])    Chronic kidney disease    Constipation    Depression    Deviated septum    Diabetes mellitus without complication ([place])    Difficult intravenous access    used port for 05-09-2019 surgery   Fibroids    History of blood transfusion    2 units given 04-26-2019, has had total of 8 units since jan 2021   History of blood transfusion 1 unit given 05-30-19   iv fluids also given   History of kidney problems    History of radiation therapy 03/23/2019-05/04/2019   Cervical external beam   Dr Gery Pray   History of radiation therapy 05/09/2019-06/05/2019   vaginal brachytherapy   Dr Gery Pray   History of recent blood transfusion 05/16/2019   2 units given  per dr Pollyann Savoy at cancer center   Hypertension    Neuropathy    both thumbs   Obesity    Palpitations    PONV (postoperative nausea and vomiting)    Prediabetes    Preeclampsia 2006   Sleep apnea    no cpap used insurance would not cover, osa severe per pt   SOB (shortness of breath)    Swallowing difficulty     Past Surgical History:  Procedure Laterality Date   CESAREAN SECTION WITH BILATERAL TUBAL LIGATION  11/10/2004        DILATION AND CURETTAGE OF UTERUS  1997   IR IMAGING GUIDED PORT INSERTION  04/04/2019   OPERATIVE ULTRASOUND N/A 05/09/2019   Procedure: OPERATIVE ULTRASOUND;  Surgeon: Gery Pray, MD;  Location: [facility];  Service: Urology;  Laterality: N/A;   OPERATIVE ULTRASOUND N/A 05/15/2019   Procedure: OPERATIVE ULTRASOUND;  Surgeon: Gery Pray, MD;  Location: [facility];  Service: Urology;  Laterality: N/A;   OPERATIVE ULTRASOUND N/A 05/22/2019   Procedure: OPERATIVE ULTRASOUND;  Surgeon: Gery Pray, MD;  Location: [facility];  Service: Urology;  Laterality: N/A;   OPERATIVE ULTRASOUND N/A 05/29/2019   Procedure: OPERATIVE ULTRASOUND;  Surgeon: Gery Pray, MD;  Location: [facility];  Service: Urology;  Laterality: N/A;   OPERATIVE ULTRASOUND N/A 06/05/2019   Procedure: OPERATIVE ULTRASOUND;  Surgeon: Gery Pray, MD;  Location: [facility];  Service: Urology;  Laterality: N/A;   TANDEM RING INSERTION N/A 05/09/2019   Procedure: TANDEM RING INSERTION;  Surgeon: Gery Pray, MD;  Location: [facility];  Service: Urology;  Laterality: N/A;   TANDEM RING INSERTION N/A 05/15/2019   Procedure: TANDEM RING INSERTION;  Surgeon: Gery Pray, MD;  Location: [facility];  Service: Urology;  Laterality: N/A;   TANDEM RING INSERTION N/A 05/22/2019   Procedure: TANDEM RING INSERTION;  Surgeon: Gery Pray, MD;  Location: [facility];  Service: Urology;  Laterality: N/A;   TANDEM RING INSERTION N/A 05/29/2019   Procedure: TANDEM RING INSERTION;  Surgeon: Gery Pray, MD;  Location: [facility];  Service: Urology;  Laterality: N/A;   TANDEM RING INSERTION N/A 06/05/2019   Procedure: TANDEM RING INSERTION;  Surgeon: Gery Pray, MD;  Location: [facility];  Service: Urology;  Laterality: N/A;    There were no vitals filed for this visit.   Subjective  Assessment - 12/06/20 1428     Subjective i am doing great, I am motivated to move more and really eat better!    Currently in Pain? No/denies    Multiple Pain Sites No               Treatment: Patient seen for aquatic therapy today.  Treatment took place in water 2.5-4 feet deep depending upon activity.  Pt entered the pool via stairs, step over step with moderate use of hand rails. Pt requires buoyancy of water for support and to offload joints with strengthening exercises.  Water temp 94 degrees F.  Seated water bench with 75% submersion Pt performed seated LE AROM exercises 20x in all planes, discussion of current status.  75% depth for the following: Water walking using large noodle for UE push/pull 10x forward & back, then side stepping 10x with VC to squat and perform jumping jack arms. Hip flex/ext 20x Bil, Marching across the pool 6x with noodle, Hip circumduction 20x Bil, Siingle limb stance with noodle 10 sec  Bil, no noodle 15 sec 2x Bil,  Underwater bicycle with 2 noodles behind pt: 1 min bouts then 1 min rest: 3 bouts total.                            PT Short Term Goals - 12/05/20 1352       PT SHORT TERM GOAL #1   Title independent with initial HEP    Time 4    Period Weeks    Status Achieved      PT SHORT TERM GOAL #2   Title educated on vaginal moisturizers to improve tissue integrity    Time 4    Period Weeks    Status Achieved               PT Long Term Goals - 11/06/20 1354       PT LONG TERM GOAL #1   Title independent with advanced exercises for strength, flexibility, and endurance    Baseline not educated yet    Time 12    Period Weeks    Status New    Target Date 01/29/21      PT LONG TERM GOAL #2   Title urinary leakage decreased >/= 75% due to improved pelvic floor strength >/= 3/5 and not holding her breath    Baseline urinary leakage with activity    Time 12    Period Weeks    Status New    Target Date  01/29/21      PT LONG TERM GOAL #3   Title sit to stand </= 11.5 seconds so she feels steadier on her feet    Baseline sit to stand 17 seconds  Time 12    Period Weeks    Status New    Target Date 01/29/21      PT LONG TERM GOAL #4   Title ambulate with increased hip extension increased >/= 5 degrees to reduce risk of falls    Baseline hip extension -20 degrees bilaterally    Time 12    Period Weeks    Status New    Target Date 01/29/21      PT LONG TERM GOAL #5   Title ----    Baseline ---                   Plan - 12/06/20 1431     Clinical Impression Statement Pt arrives for aquatic PT very motivated and energized secondary to her new weight loss. Pt able to single limb stance in waist depth , no UE for 15 sec. Pt shows no signs of fatigue with her exercises today including 1 min bicycling.    Personal Factors and Comorbidities Fitness;Comorbidity 3+;Time since onset of injury/illness/exacerbation    Comorbidities cervical cancer with radiation on 03/23/2019-05/04/2019 and 05/09/2019-06/15/2019 ; diabetes; c-section 11/10/2004    Examination-Activity Limitations Locomotion Level;Sleep;Continence;Toileting;Bend    Examination-Participation Restrictions Community Activity    Stability/Clinical Decision Making Evolving/Moderate complexity    Rehab Potential Good    PT Frequency 2x / week    PT Duration 12 weeks    PT Treatment/Interventions ADLs/Self Care Home Management;Biofeedback;Therapeutic activities;Therapeutic exercise;Balance training;Neuromuscular re-education;Patient/family education;Manual techniques;Passive range of motion;Dry needling;Spinal Manipulations;Joint Manipulations;Aquatic Therapy    PT Next Visit Plan when in the pool working on balance, hip  extension and trunk mobility, and core strength; pelvic floor work on manual mobilizaiton of the vaginal tissue, joint mobilization of the hips and pelvic floor contractions    PT Home Exercise Plan Access Code:  [email or web address]    Consulted and Agree with Plan of Care Patient             Patient will benefit from skilled therapeutic intervention in order to improve the following deficits and impairments:  Abnormal gait, Decreased coordination, Decreased range of motion, Difficulty walking, Increased fascial restricitons, Increased muscle spasms, Pain, Decreased strength, Decreased mobility, Decreased balance, Impaired flexibility  Visit Diagnosis: Cramp and spasm  Other urinary incontinence  Other abnormalities of gait and mobility  Muscle weakness (generalized)     Problem List Patient Active Problem List   Diagnosis Date Noted   Type 2 diabetes mellitus without complication, without long-term current use of insulin ([place]) 11/15/2020   Incontinence of urine in female 09/09/2020   Onychomycosis of toenail 07/31/2020   OSA (obstructive sleep apnea) 07/31/2020   Prediabetes 05/27/2020   Sinus congestion 04/16/2020   Port-A-Cath in place 01/22/2020   Other hyperlipidemia 01/10/2020   Vitamin D deficiency 01/10/2020   Insulin resistance 01/10/2020   Class 3 severe obesity with serious comorbidity and body mass index (BMI) of 40.0 to 44.9 in adult ([place]) 01/10/2020   Obesity, Class II, BMI 35-39.9 09/07/2019   Dehydration 05/30/2019   Peripheral neuropathy due to chemotherapy ([place]) 04/25/2019   Nausea without vomiting 04/18/2019   Diarrhea 04/04/2019   CKD (chronic kidney disease), symptom management only, stage 3 (moderate) ([place]) 03/30/2019   Generalized anxiety disorder 03/30/2019   Hypertension associated with type 2 diabetes mellitus ([place])    Anxiety 03/23/2019   Squamous cell carcinoma of cervix ([place]) 03/23/2019   Deficiency anemia 03/22/2019   Squamous cell carcinoma in situ (SCCIS) of cervix 03/22/2019    Richerd Grime,  PTA 12/06/2020, 9:35 PM  [facility] @ [place], [place], [address] Phone:  [phone]   Fax:  [phone]  Name: Tamara Osborne MRN: [id number] Date of Birth: [birth year]

## 2020-12-09 ENCOUNTER — Telehealth (INDEPENDENT_AMBULATORY_CARE_PROVIDER_SITE_OTHER): Payer: Self-pay | Admitting: Primary Care

## 2020-12-09 NOTE — Telephone Encounter (Signed)
Pt that is requesting if Lurena Joiner can fax lab orders to the cancer center for her. Pt was given instructions by you in her AVS to the cancer center order labs cmp 14, egfr.   Fax number cancer center- [phone]  Please advise with the pt- [phone]

## 2020-12-09 NOTE — Telephone Encounter (Signed)
Please place order for CMP with egfr. Order will need to be faxed to cancer center. Patient is a difficult stick and has to have labs drawn from port. See last ov with pharmacist for further detail.

## 2020-12-10 ENCOUNTER — Other Ambulatory Visit: Payer: Self-pay | Admitting: Hematology and Oncology

## 2020-12-10 ENCOUNTER — Other Ambulatory Visit: Payer: Self-pay | Admitting: Hematology

## 2020-12-10 DIAGNOSIS — C539 Malignant neoplasm of cervix uteri, unspecified: Secondary | ICD-10-CM

## 2020-12-11 ENCOUNTER — Other Ambulatory Visit (INDEPENDENT_AMBULATORY_CARE_PROVIDER_SITE_OTHER): Payer: Self-pay | Admitting: Primary Care

## 2020-12-11 DIAGNOSIS — N183 Chronic kidney disease, stage 3 unspecified: Secondary | ICD-10-CM

## 2020-12-11 MED ORDER — MISC. DEVICES MISC: 0 refills | Status: DC

## 2020-12-12 ENCOUNTER — Other Ambulatory Visit (INDEPENDENT_AMBULATORY_CARE_PROVIDER_SITE_OTHER): Payer: Self-pay | Admitting: Primary Care

## 2020-12-12 ENCOUNTER — Inpatient Hospital Stay: Payer: Medicaid Other | Attending: Gynecologic Oncology

## 2020-12-12 ENCOUNTER — Other Ambulatory Visit: Payer: Self-pay

## 2020-12-12 DIAGNOSIS — N183 Chronic kidney disease, stage 3 unspecified: Secondary | ICD-10-CM

## 2020-12-12 DIAGNOSIS — C539 Malignant neoplasm of cervix uteri, unspecified: Secondary | ICD-10-CM | POA: Diagnosis not present

## 2020-12-12 DIAGNOSIS — Z95828 Presence of other vascular implants and grafts: Secondary | ICD-10-CM

## 2020-12-12 LAB — CBC WITH DIFFERENTIAL/PLATELET
Abs Immature Granulocytes: 0.05 10*3/uL (ref 0.00–0.07)
Basophils Absolute: 0 10*3/uL (ref 0.0–0.1)
Basophils Relative: 1 %
Eosinophils Absolute: 0.6 10*3/uL — ABNORMAL HIGH (ref 0.0–0.5)
Eosinophils Relative: 8 %
HCT: 37.9 % (ref 36.0–46.0)
Hemoglobin: 13.1 g/dL (ref 12.0–15.0)
Immature Granulocytes: 1 %
Lymphocytes Relative: 14 %
Lymphs Abs: 1.1 10*3/uL (ref 0.7–4.0)
MCH: 27.9 pg (ref 26.0–34.0)
MCHC: 34.6 g/dL (ref 30.0–36.0)
MCV: 80.6 fL (ref 80.0–100.0)
Monocytes Absolute: 0.5 10*3/uL (ref 0.1–1.0)
Monocytes Relative: 7 %
Neutro Abs: 5.3 10*3/uL (ref 1.7–7.7)
Neutrophils Relative %: 69 %
Platelets: 304 10*3/uL (ref 150–400)
RBC: 4.7 MIL/uL (ref 3.87–5.11)
RDW: 14 % (ref 11.5–15.5)
WBC: 7.6 10*3/uL (ref 4.0–10.5)
nRBC: 0 % (ref 0.0–0.2)

## 2020-12-12 LAB — CMP (CANCER CENTER ONLY)
ALT: 70 U/L — ABNORMAL HIGH (ref 0–44)
AST: 49 U/L — ABNORMAL HIGH (ref 15–41)
Albumin: 4.1 g/dL (ref 3.5–5.0)
Alkaline Phosphatase: 92 U/L (ref 38–126)
Anion gap: 7 (ref 5–15)
BUN: 34 mg/dL — ABNORMAL HIGH (ref 6–20)
CO2: 25 mmol/L (ref 22–32)
Calcium: 9.3 mg/dL (ref 8.9–10.3)
Chloride: 105 mmol/L (ref 98–111)
Creatinine: 1.44 mg/dL — ABNORMAL HIGH (ref 0.44–1.00)
GFR, Estimated: 46 mL/min — ABNORMAL LOW (ref >60)
Glucose, Bld: 97 mg/dL (ref 70–99)
Potassium: 4.1 mmol/L (ref 3.5–5.1)
Sodium: 137 mmol/L (ref 135–145)
Total Bilirubin: 1 mg/dL (ref 0.3–1.2)
Total Protein: 7.9 g/dL (ref 6.5–8.1)

## 2020-12-12 MED ORDER — HEPARIN SOD (PORK) LOCK FLUSH 100 UNIT/ML IV SOLN
500.0000 [IU] | Freq: Once | INTRAVENOUS | Status: AC
  Administered 2020-12-12: 500 [IU]

## 2020-12-12 MED ORDER — SODIUM CHLORIDE 0.9% FLUSH
10.0000 mL | Freq: Once | INTRAVENOUS | Status: AC
  Administered 2020-12-12: 10 mL

## 2020-12-13 ENCOUNTER — Ambulatory Visit: Payer: Medicaid Other | Admitting: Physical Therapy

## 2020-12-13 ENCOUNTER — Encounter: Payer: Self-pay | Admitting: Physical Therapy

## 2020-12-13 DIAGNOSIS — R2689 Other abnormalities of gait and mobility: Secondary | ICD-10-CM

## 2020-12-13 DIAGNOSIS — R252 Cramp and spasm: Secondary | ICD-10-CM | POA: Diagnosis not present

## 2020-12-13 DIAGNOSIS — M6281 Muscle weakness (generalized): Secondary | ICD-10-CM

## 2020-12-13 DIAGNOSIS — N39498 Other specified urinary incontinence: Secondary | ICD-10-CM

## 2020-12-13 NOTE — Therapy (Signed)
[facility] @ [place], [place], [address] Phone: [phone]   Fax:  [phone]  Physical Therapy Treatment  Patient Details  Name: Tamara Osborne MRN: [id number] Date of Birth: 09-Jul-1975 Referring Provider (PT): [name], NP   Encounter Date: 12/13/2020   PT End of Session - 12/13/20 1333     Visit Number 9    Date for PT Re-Evaluation 01/29/21    Authorization Type [employer]    PT Start Time 4431    PT Stop Time 1330    PT Time Calculation (min) 45 min    Activity Tolerance Patient tolerated treatment well;No increased pain    Behavior During Therapy WFL for tasks assessed/performed             Past Medical History:  Diagnosis Date   Anemia    Anxiety    Bartholin cyst    Cervical cancer ([place])    Chronic kidney disease    Constipation    Depression    Deviated septum    Diabetes mellitus without complication ([place])    Difficult intravenous access    used port for 05-09-2019 surgery   Fibroids    History of blood transfusion    2 units given 04-26-2019, has had total of 8 units since jan 2021   History of blood transfusion 1 unit given 05-30-19   iv fluids also given   History of kidney problems    History of radiation therapy 03/23/2019-05/04/2019   Cervical external beam   Dr Gery Pray   History of radiation therapy 05/09/2019-06/05/2019   vaginal brachytherapy   Dr Gery Pray   History of recent blood transfusion 05/16/2019   2 units given  per dr Pollyann Savoy at cancer center   Hypertension    Neuropathy    both thumbs   Obesity    Palpitations    PONV (postoperative nausea and vomiting)    Prediabetes    Preeclampsia 2006   Sleep apnea    no cpap used insurance would not cover, osa severe per pt   SOB (shortness of breath)    Swallowing difficulty     Past Surgical History:  Procedure Laterality Date   CESAREAN SECTION WITH BILATERAL TUBAL LIGATION  11/10/2004        DILATION AND CURETTAGE OF UTERUS  1997   IR IMAGING GUIDED PORT INSERTION  04/04/2019   OPERATIVE ULTRASOUND N/A 05/09/2019   Procedure: OPERATIVE ULTRASOUND;  Surgeon: Gery Pray, MD;  Location: [facility];  Service: Urology;  Laterality: N/A;   OPERATIVE ULTRASOUND N/A 05/15/2019   Procedure: OPERATIVE ULTRASOUND;  Surgeon: Gery Pray, MD;  Location: [facility];  Service: Urology;  Laterality: N/A;   OPERATIVE ULTRASOUND N/A 05/22/2019   Procedure: OPERATIVE ULTRASOUND;  Surgeon: Gery Pray, MD;  Location: [facility];  Service: Urology;  Laterality: N/A;   OPERATIVE ULTRASOUND N/A 05/29/2019   Procedure: OPERATIVE ULTRASOUND;  Surgeon: Gery Pray, MD;  Location: [facility];  Service: Urology;  Laterality: N/A;   OPERATIVE ULTRASOUND N/A 06/05/2019   Procedure: OPERATIVE ULTRASOUND;  Surgeon: Gery Pray, MD;  Location: [facility];  Service: Urology;  Laterality: N/A;   TANDEM RING INSERTION N/A 05/09/2019   Procedure: TANDEM RING INSERTION;  Surgeon: Gery Pray, MD;  Location: [facility];  Service: Urology;  Laterality: N/A;   TANDEM RING INSERTION N/A 05/15/2019   Procedure: TANDEM RING INSERTION;  Surgeon: Gery Pray, MD;  Location: [facility];  Service: Urology;  Laterality: N/A;   TANDEM RING INSERTION N/A 05/22/2019   Procedure: TANDEM RING INSERTION;  Surgeon: Gery Pray, MD;  Location: [facility];  Service: Urology;  Laterality: N/A;   TANDEM RING INSERTION N/A 05/29/2019   Procedure: TANDEM RING INSERTION;  Surgeon: Gery Pray, MD;  Location: [facility];  Service: Urology;  Laterality: N/A;   TANDEM RING INSERTION N/A 06/05/2019   Procedure: TANDEM RING INSERTION;  Surgeon: Gery Pray, MD;  Location: [facility];  Service: Urology;  Laterality: N/A;    There were no vitals filed for this visit.   Subjective  Assessment - 12/13/20 1339     Subjective No new complaints.    Currently in Pain? No/denies    Multiple Pain Sites No             Treatment: Patient seen for aquatic therapy today.  Treatment took place in water 2.5-4 feet deep depending upon activity.  Pt entered the pool via stairs: step to step with mild use of rails. Pt requires buoyancy of water for support and to offload joints with strengthening exercises.  Water temp 94 degrees F.  75% depth for the following; Water walking with large noodle for Ue push & pull 10x frwd/bkward, then 10x side stepping with Vc to squat more and perform jumping jack arms with more push & pull against the water.   High knee march 6 lengths of pool, no AD for balance today. Wall exercises: no UE for hip 4 ways 10x each Tandem stance Bil, UE weights ( yellow double) 1 min horizontal abd/add each side Underwater bicycle with 2 noodles behind pt 1 min 4 bouts                             PT Short Term Goals - 12/05/20 1352       PT SHORT TERM GOAL #1   Title independent with initial HEP    Time 4    Period Weeks    Status Achieved      PT SHORT TERM GOAL #2   Title educated on vaginal moisturizers to improve tissue integrity    Time 4    Period Weeks    Status Achieved               PT Long Term Goals - 11/06/20 1354       PT LONG TERM GOAL #1   Title independent with advanced exercises for strength, flexibility, and endurance    Baseline not educated yet    Time 12    Period Weeks    Status New    Target Date 01/29/21      PT LONG TERM GOAL #2   Title urinary leakage decreased >/= 75% due to improved pelvic floor strength >/= 3/5 and not holding her breath    Baseline urinary leakage with activity    Time 12    Period Weeks    Status New    Target Date 01/29/21      PT LONG TERM GOAL #3   Title sit to stand </= 11.5 seconds so she feels steadier on her feet    Baseline sit to stand 17 seconds     Time 12    Period Weeks    Status New    Target Date 01/29/21  PT LONG TERM GOAL #4   Title ambulate with increased hip extension increased >/= 5 degrees to reduce risk of falls    Baseline hip extension -20 degrees bilaterally    Time 12    Period Weeks    Status New    Target Date 01/29/21      PT LONG TERM GOAL #5   Title ----    Baseline ---                   Plan - 12/13/20 1334     Clinical Impression Statement Pt arrives with positive attitude and feeling good about her exercises. Resistance was added to most aquatic exercises today. Pt showing no outward signs of fatigue. Pt reports it is difficult to "feel" her abominals/core but continues to work on it.    Personal Factors and Comorbidities Fitness;Comorbidity 3+;Time since onset of injury/illness/exacerbation    Comorbidities cervical cancer with radiation on 03/23/2019-05/04/2019 and 05/09/2019-06/15/2019 ; diabetes; c-section 11/10/2004    Examination-Activity Limitations Locomotion Level;Sleep;Continence;Toileting;Bend    Stability/Clinical Decision Making Evolving/Moderate complexity    Rehab Potential Good    PT Frequency 2x / week    PT Duration 12 weeks    PT Treatment/Interventions ADLs/Self Care Home Management;Biofeedback;Therapeutic activities;Therapeutic exercise;Balance training;Neuromuscular re-education;Patient/family education;Manual techniques;Passive range of motion;Dry needling;Spinal Manipulations;Joint Manipulations;Aquatic Therapy    PT Next Visit Plan when in the pool working on balance, hip  extension and trunk mobility, and core strength; pelvic floor work on manual mobilizaiton of the vaginal tissue, joint mobilization of the hips and pelvic floor contractions    PT Home Exercise Plan Access Code: [facility]             Patient will benefit from skilled therapeutic intervention in order to improve the following deficits and impairments:  Abnormal gait, Decreased coordination,  Decreased range of motion, Difficulty walking, Increased fascial restricitons, Increased muscle spasms, Pain, Decreased strength, Decreased mobility, Decreased balance, Impaired flexibility  Visit Diagnosis: Cramp and spasm  Other urinary incontinence  Other abnormalities of gait and mobility  Muscle weakness (generalized)     Problem List Patient Active Problem List   Diagnosis Date Noted   Type 2 diabetes mellitus without complication, without long-term current use of insulin ([place]) 11/15/2020   Incontinence of urine in female 09/09/2020   Onychomycosis of toenail 07/31/2020   OSA (obstructive sleep apnea) 07/31/2020   Prediabetes 05/27/2020   Sinus congestion 04/16/2020   Port-A-Cath in place 01/22/2020   Other hyperlipidemia 01/10/2020   Vitamin D deficiency 01/10/2020   Insulin resistance 01/10/2020   Class 3 severe obesity with serious comorbidity and body mass index (BMI) of 40.0 to 44.9 in adult ([place]) 01/10/2020   Obesity, Class II, BMI 35-39.9 09/07/2019   Dehydration 05/30/2019   Peripheral neuropathy due to chemotherapy ([place]) 04/25/2019   Nausea without vomiting 04/18/2019   Diarrhea 04/04/2019   CKD (chronic kidney disease), symptom management only, stage 3 (moderate) ([place]) 03/30/2019   Generalized anxiety disorder 03/30/2019   Hypertension associated with type 2 diabetes mellitus ([place])    Anxiety 03/23/2019   Squamous cell carcinoma of cervix ([place]) 03/23/2019   Deficiency anemia 03/22/2019   Squamous cell carcinoma in situ (SCCIS) of cervix 03/22/2019    [place] Mckim, PTA 12/13/2020, 1:43 PM  [facility] @ [place], [place], [address] Phone: [phone]   Fax:  [phone]  Name: Tamara Osborne MRN: [id number] Date of Birth: [birth year]

## 2020-12-17 ENCOUNTER — Encounter (INDEPENDENT_AMBULATORY_CARE_PROVIDER_SITE_OTHER): Payer: Self-pay | Admitting: Adult Health

## 2020-12-17 ENCOUNTER — Other Ambulatory Visit: Payer: Self-pay

## 2020-12-17 ENCOUNTER — Ambulatory Visit (INDEPENDENT_AMBULATORY_CARE_PROVIDER_SITE_OTHER): Payer: Medicaid Other | Admitting: Adult Health

## 2020-12-17 VITALS — BP 135/81 | HR 84 | Temp 97.7°F | Ht 62.0 in | Wt 218.0 lb

## 2020-12-17 DIAGNOSIS — N183 Chronic kidney disease, stage 3 unspecified: Secondary | ICD-10-CM | POA: Diagnosis not present

## 2020-12-17 DIAGNOSIS — Z6841 Body Mass Index (BMI) 40.0 and over, adult: Secondary | ICD-10-CM | POA: Diagnosis not present

## 2020-12-17 DIAGNOSIS — E119 Type 2 diabetes mellitus without complications: Secondary | ICD-10-CM | POA: Diagnosis not present

## 2020-12-17 MED ORDER — BD PEN NEEDLE NANO 2ND GEN 32G X 4 MM MISC: 1.0000 | Freq: Two times a day (BID) | 0 refills | Status: DC

## 2020-12-17 MED ORDER — VICTOZA 18 MG/3ML ~~LOC~~ SOPN: 1.8000 mg | PEN_INJECTOR | Freq: Every day | SUBCUTANEOUS | 0 refills | Status: DC

## 2020-12-17 NOTE — Progress Notes (Signed)
Chief Complaint:   OBESITY Tamara Osborne is here to discuss her progress with her obesity treatment plan along with follow-up of her obesity related diagnoses. [name] is on the Category 2 Plan and states she is following her eating plan approximately 90-95% of the time. [name] states she is doing PT and aerobics 45-60 minutes 2 times per week.  Today's visit was #: 24 Starting weight: 208 lbs Starting date: 09/20/2019 Today's weight: 218 lbs Today's date: 12/17/2020 Total lbs lost to date: 0 Total lbs lost since last in-office visit: 6  Interim History: Tamara Osborne had several of her wisdom teeth extracted approximately 3 weeks ago. It has been difficult to masticate food the last several weeks. She reports stable appetite.  Subjective:   1. Type 2 diabetes mellitus without complication, without long-term current use of insulin ([place]) 10/18/2020 A1c 7.5- above goal. She is on Metformin XR 500mg  (managed by PharmD) and Victoza 1.8mg  (managed by [place]). She denies mass in neck, dysphagia, dyspepsia, or persistent hoarseness.  2. CKD (chronic kidney disease), symptom management only, stage 3 (moderate) ([place]) 12/12/2020 CMP revealed stable creatinine/GFR.   Assessment/Plan:   1. Type 2 diabetes mellitus without complication, without long-term current use of insulin (HCC) Good blood sugar control is important to decrease the likelihood of diabetic complications such as nephropathy, neuropathy, limb loss, blindness, coronary artery disease, and death. Intensive lifestyle modification including diet, exercise and weight loss are the first line of treatment for diabetes. Continue current treatment plan.  Refill- liraglutide (VICTOZA) 18 MG/3ML SOPN; Inject 1.8 mg into the skin daily.  Dispense: 9 mL; Refill: 0 Refill- Insulin Pen Needle (BD PEN NEEDLE NANO 2ND GEN) 32G X 4 MM MISC; 1 Package by Does not apply route 2 (two) times daily.  Dispense: 100 each; Refill: 0  2. CKD (chronic kidney  disease), symptom management only, stage 3 (moderate) (HCC) Avoid NSAIDs. Lab results and trends reviewed. We discussed several lifestyle modifications today and she will continue to work on diet, exercise and weight loss efforts. Avoid nephrotoxic medications. Orders and follow up as documented in patient record.   Counseling Chronic kidney disease (CKD) happens when the kidneys are damaged over a long period of time. Most of the time, this condition does not go away, but it can usually be controlled. Steps must be taken to slow down the kidney damage or to stop it from getting worse. Intensive lifestyle modifications are the first line treatment for this issue.  Avoid buying foods that are: processed, frozen, or prepackaged to avoid excess salt.  3. Class 3 severe obesity with serious comorbidity and body mass index (BMI) of 40.0 to 44.9 in adult, unspecified obesity type ([place]), current BMI 39.9  Tamara Osborne is currently in the action stage of change. As such, her goal is to continue with weight loss efforts. She has agreed to the Category 2 Plan.   Exercise goals:  As is  Behavioral modification strategies: increasing lean protein intake, decreasing simple carbohydrates, meal planning and cooking strategies, keeping healthy foods in the home, and planning for success.  Tamara Osborne has agreed to follow-up with our clinic in 3 weeks. She was informed of the importance of frequent follow-up visits to maximize her success with intensive lifestyle modifications for her multiple health conditions.   Objective:   Blood pressure 135/81, pulse 84, temperature 97.7 F (36.5 C), height 5\' 2"  (1.575 m), weight 218 lb (98.9 kg), SpO2 97 %. Body mass index is 39.87 kg/m.  General: Cooperative, alert,  well developed, in no acute distress. HEENT: Conjunctivae and lids unremarkable. Cardiovascular: Regular rhythm.  Lungs: Normal work of breathing. Neurologic: No focal deficits.   Lab Results  Component  Value Date   CREATININE 1.44 (H) 12/12/2020   BUN 34 (H) 12/12/2020   NA 137 12/12/2020   K 4.1 12/12/2020   CL 105 12/12/2020   CO2 25 12/12/2020   Lab Results  Component Value Date   ALT 70 (H) 12/12/2020   AST 49 (H) 12/12/2020   ALKPHOS 92 12/12/2020   BILITOT 1.0 12/12/2020   Lab Results  Component Value Date   HGBA1C 7.5 (H) 10/18/2020   HGBA1C 6.0 (H) 05/20/2020   HGBA1C 5.2 10/30/2019   HGBA1C CANCELED 09/20/2019   Lab Results  Component Value Date   INSULIN 40.3 (H) 09/20/2019   Lab Results  Component Value Date   TSH 1.910 09/20/2019   Lab Results  Component Value Date   CHOL 214 (H) 05/20/2020   HDL 32 (L) 05/20/2020   LDLCALC 145 (H) 05/20/2020   TRIG 184 (H) 05/20/2020   CHOLHDL 6.7 05/20/2020   Lab Results  Component Value Date   VD25OH 59.37 10/18/2020   VD25OH 45.60 05/20/2020   VD25OH 69.37 01/22/2020   Lab Results  Component Value Date   WBC 7.6 12/12/2020   HGB 13.1 12/12/2020   HCT 37.9 12/12/2020   MCV 80.6 12/12/2020   PLT 304 12/12/2020   Lab Results  Component Value Date   IRON 17 (L) 03/27/2019   TIBC 283 03/27/2019   FERRITIN 32 03/27/2019   Attestation Statements:   Reviewed by clinician on day of visit: allergies, medications, problem list, medical history, surgical history, family history, social history, and previous encounter notes.  Time spent on visit including pre-visit chart review and post-visit care and charting was 35 minutes.   Coral Ceo, CMA, am acting as transcriptionist for Mina Marble, NP.  I have reviewed the above documentation for accuracy and completeness, and I agree with the above. - Caiden Monsivais d. Kiron Osmun, NP-C

## 2020-12-19 ENCOUNTER — Encounter: Payer: Self-pay | Admitting: Physical Therapy

## 2020-12-19 ENCOUNTER — Other Ambulatory Visit: Payer: Self-pay

## 2020-12-19 ENCOUNTER — Encounter: Payer: Medicaid Other | Admitting: Physical Therapy

## 2020-12-19 DIAGNOSIS — M6281 Muscle weakness (generalized): Secondary | ICD-10-CM

## 2020-12-19 DIAGNOSIS — R252 Cramp and spasm: Secondary | ICD-10-CM

## 2020-12-19 DIAGNOSIS — N39498 Other specified urinary incontinence: Secondary | ICD-10-CM

## 2020-12-19 DIAGNOSIS — R2689 Other abnormalities of gait and mobility: Secondary | ICD-10-CM

## 2020-12-19 NOTE — Therapy (Signed)
[place] [facility] [address], [place], [place], [address] Phone: [phone]   Fax:  [phone]  Physical Therapy Treatment  Patient Details  Name: Tamara Osborne MRN: [id number] Date of Birth: [birth year] Referring Provider (PT): [name], NP   Encounter Date: 12/19/2020   PT End of Session - 12/19/20 1343     Visit Number 10    Date for PT Re-Evaluation 01/29/21    Authorization Type Healthy blue    Authorization Time Period used 7 for prior treatment this year    Authorization - Visit Number 10    Authorization - Number of Visits 20    PT Start Time 1300    PT Stop Time 1345    PT Time Calculation (min) 45 min    Activity Tolerance Patient tolerated treatment well;No increased pain    Behavior During Therapy WFL for tasks assessed/performed             Past Medical History:  Diagnosis Date   Anemia    Anxiety    Bartholin cyst    Cervical cancer ([place])    Chronic kidney disease    Constipation    Depression    Deviated septum    Diabetes mellitus without complication ([place])    Difficult intravenous access    used port for 05-09-2019 surgery   Fibroids    History of blood transfusion    2 units given 04-26-2019, has had total of 8 units since jan 2021   History of blood transfusion 1 unit given 05-30-19   iv fluids also given   History of kidney problems    History of radiation therapy 03/23/2019-05/04/2019   Cervical external beam   Dr Gery Pray   History of radiation therapy 05/09/2019-06/05/2019   vaginal brachytherapy   Dr Gery Pray   History of recent blood transfusion 05/16/2019   2 units given  per dr Pollyann Savoy at cancer center   Hypertension    Neuropathy    both thumbs   Obesity    Palpitations    PONV (postoperative nausea and vomiting)    Prediabetes    Preeclampsia 2006   Sleep apnea    no cpap used insurance would not cover, osa severe per pt   SOB (shortness of breath)     Swallowing difficulty     Past Surgical History:  Procedure Laterality Date   CESAREAN SECTION WITH BILATERAL TUBAL LIGATION  11/10/2004       DILATION AND CURETTAGE OF UTERUS  1997   IR IMAGING GUIDED PORT INSERTION  04/04/2019   OPERATIVE ULTRASOUND N/A 05/09/2019   Procedure: OPERATIVE ULTRASOUND;  Surgeon: Gery Pray, MD;  Location: [facility];  Service: Urology;  Laterality: N/A;   OPERATIVE ULTRASOUND N/A 05/15/2019   Procedure: OPERATIVE ULTRASOUND;  Surgeon: Gery Pray, MD;  Location: [facility];  Service: Urology;  Laterality: N/A;   OPERATIVE ULTRASOUND N/A 05/22/2019   Procedure: OPERATIVE ULTRASOUND;  Surgeon: Gery Pray, MD;  Location: [facility];  Service: Urology;  Laterality: N/A;   OPERATIVE ULTRASOUND N/A 05/29/2019   Procedure: OPERATIVE ULTRASOUND;  Surgeon: Gery Pray, MD;  Location: [facility];  Service: Urology;  Laterality: N/A;   OPERATIVE ULTRASOUND N/A 06/05/2019   Procedure: OPERATIVE ULTRASOUND;  Surgeon: Gery Pray, MD;  Location: [facility];  Service: Urology;  Laterality: N/A;   TANDEM RING INSERTION N/A 05/09/2019   Procedure: TANDEM RING INSERTION;  Surgeon:  Gery Pray, MD;  Location: [facility];  Service: Urology;  Laterality: N/A;   TANDEM RING INSERTION N/A 05/15/2019   Procedure: TANDEM RING INSERTION;  Surgeon: Gery Pray, MD;  Location: [facility];  Service: Urology;  Laterality: N/A;   TANDEM RING INSERTION N/A 05/22/2019   Procedure: TANDEM RING INSERTION;  Surgeon: Gery Pray, MD;  Location: [facility];  Service: Urology;  Laterality: N/A;   TANDEM RING INSERTION N/A 05/29/2019   Procedure: TANDEM RING INSERTION;  Surgeon: Gery Pray, MD;  Location: [facility];  Service: Urology;  Laterality: N/A;   TANDEM RING INSERTION N/A 06/05/2019   Procedure: TANDEM RING INSERTION;  Surgeon: Gery Pray, MD;  Location: [facility];  Service: Urology;  Laterality: N/A;    There were no vitals filed for this visit.   Subjective Assessment - 12/19/20 1308     Subjective Patient was able to go to the football game without a diaper. She did not leak  urine. Patient is not getting up to go to the bathroom in the middle of the night. No leakage in thte middle of the night. Patient has not had a complete accident when she wakes up in the middle of September. Patient is using the dilator every couple of days. Last time having intercourse it was painful. Pain with intercourse decreased by 50%. Patient has discomfort with the dilator when it is fully in due to lengthening the vaginal canal.    Patient Stated Goals reduce urinary leakage    Currently in Pain? No/denies    Pain Score 5     Pain Location Vagina    Pain Orientation Mid    Pain Descriptors / Indicators Sore    Pain Type Chronic pain    Pain Onset More than a month ago    Pain Frequency Intermittent    Aggravating Factors  penile penetration vaginally    Pain Relieving Factors no penile penetration    Multiple Pain Sites No                [facility] PT Assessment - 12/19/20 0001       Strength   Right Hip Extension 4+/5    Right Hip ABduction 4/5    Left Hip Extension 4/5    Left Hip ABduction 4/5                        Pelvic Floor Special Questions - 12/19/20 0001     Pelvic Floor Internal Exam Patient confirms identificaiton and approves PT to assess pelvic floor and treatment    Exam Type Vaginal    Palpation tightness in the top of the vaginal canal    Strength fair squeeze, definite lift               [facility] Adult PT Treatment/Exercise - 12/19/20 0001       Manual Therapy   Manual Therapy Internal Pelvic Floor    Internal Pelvic Floor manual work to the levator ani and top of the vaginal vault to elongate the canal                       PT Short Term Goals -  12/05/20 1352       PT SHORT TERM GOAL #1   Title independent with initial HEP    Time 4    Period Weeks    Status Achieved  PT SHORT TERM GOAL #2   Title educated on vaginal moisturizers to improve tissue integrity    Time 4    Period Weeks    Status Achieved               PT Long Term Goals - 12/19/20 1355       PT LONG TERM GOAL #1   Title independent with advanced exercises for strength, flexibility, and endurance    Time 12    Period Weeks    Status On-going      PT LONG TERM GOAL #2   Title urinary leakage decreased >/= 75% due to improved pelvic floor strength >/= 3/5 and not holding her breath    Baseline was able to go to a football game without a diaper on    Time 12    Period Weeks    Status On-going      PT LONG TERM GOAL #3   Title sit to stand </= 11.5 seconds so she feels steadier on her feet    Baseline sit to stand 17 seconds    Time 12    Period Weeks    Status On-going      PT LONG TERM GOAL #4   Title ambulate with increased hip extension increased >/= 5 degrees to reduce risk of falls    Baseline hip extension -20 degrees bilaterally    Time 12    Period Weeks    Status On-going                   Plan - 12/19/20 1344     Clinical Impression Statement Patietn reports She was able to go to a football game without a diaper. She is able to have penile penetration vaginally with 50% less pain. Her discomfort is due to the length of the vaginal canal. Patient pelvic floor strength 3/5 with a good  hug of the therapist finger. Patient had more tightness on the right side of the upper vaginal canal. Patietn is doing well with the aquatic exercises. She reports she has lost 6 pounds putting less pressure on the pelvic floor. Patient will beneif tfrom skilled therapy to improve muslce strength and coordination to decrease urinary leakage and increase balance.    Personal Factors and Comorbidities Fitness;Comorbidity 3+;Time since onset  of injury/illness/exacerbation    Comorbidities cervical cancer with radiation on 03/23/2019-05/04/2019 and 05/09/2019-06/15/2019 ; diabetes; c-section 11/10/2004    Examination-Activity Limitations Locomotion Level;Sleep;Continence;Toileting;Bend    Stability/Clinical Decision Making Evolving/Moderate complexity    Rehab Potential Good    PT Frequency 2x / week    PT Duration 12 weeks    PT Treatment/Interventions ADLs/Self Care Home Management;Biofeedback;Therapeutic activities;Therapeutic exercise;Balance training;Neuromuscular re-education;Patient/family education;Manual techniques;Passive range of motion;Dry needling;Spinal Manipulations;Joint Manipulations;Aquatic Therapy    PT Next Visit Plan when in the pool working on balance, hip  extension and trunk mobility, and core strength; pelvic floor work on manual mobilizaiton of the vaginal tissue, joint mobilization of the hips and pelvic floor contractions:1 more visit with pelvic floor and 4 left with aquatics then pateint is to be discharged. Check sit to stand time and measure hip extension    PT Home Exercise Plan Access Code: [facility]    Consulted and Agree with Plan of Care Patient             Patient will benefit from skilled therapeutic intervention in order to improve the following deficits and impairments:  Abnormal gait, Decreased coordination, Decreased range of motion,  Difficulty walking, Increased fascial restricitons, Increased muscle spasms, Pain, Decreased strength, Decreased mobility, Decreased balance, Impaired flexibility  Visit Diagnosis: Cramp and spasm  Other urinary incontinence  Other abnormalities of gait and mobility  Muscle weakness (generalized)     Problem List Patient Active Problem List   Diagnosis Date Noted   Type 2 diabetes mellitus without complication, without long-term current use of insulin ([place]) 11/15/2020   Incontinence of urine in female 09/09/2020   Onychomycosis of toenail 07/31/2020    OSA (obstructive sleep apnea) 07/31/2020   Prediabetes 05/27/2020   Sinus congestion 04/16/2020   Port-A-Cath in place 01/22/2020   Other hyperlipidemia 01/10/2020   Vitamin D deficiency 01/10/2020   Insulin resistance 01/10/2020   Class 3 severe obesity with serious comorbidity and body mass index (BMI) of 40.0 to 44.9 in adult ([place]) 01/10/2020   Obesity, Class II, BMI 35-39.9 09/07/2019   Dehydration 05/30/2019   Peripheral neuropathy due to chemotherapy ([place]) 04/25/2019   Nausea without vomiting 04/18/2019   Diarrhea 04/04/2019   CKD (chronic kidney disease), symptom management only, stage 3 (moderate) ([place]) 03/30/2019   Generalized anxiety disorder 03/30/2019   Hypertension associated with type 2 diabetes mellitus ([place])    Anxiety 03/23/2019   Squamous cell carcinoma of cervix ([place]) 03/23/2019   Deficiency anemia 03/22/2019   Squamous cell carcinoma in situ (SCCIS) of cervix 03/22/2019    Earlie Counts, PT 12/19/20 1:57 PM  [facility] [address] [place], [place], [address] Phone: [phone]   Fax:  [phone]  Name: Tamara Osborne MRN: [id number] Date of Birth: 1975/04/25

## 2020-12-20 ENCOUNTER — Ambulatory Visit: Payer: Medicaid Other | Admitting: Physical Therapy

## 2020-12-20 ENCOUNTER — Encounter: Payer: Self-pay | Admitting: Physical Therapy

## 2020-12-20 DIAGNOSIS — M6281 Muscle weakness (generalized): Secondary | ICD-10-CM

## 2020-12-20 DIAGNOSIS — N39498 Other specified urinary incontinence: Secondary | ICD-10-CM

## 2020-12-20 DIAGNOSIS — R2689 Other abnormalities of gait and mobility: Secondary | ICD-10-CM

## 2020-12-20 DIAGNOSIS — R252 Cramp and spasm: Secondary | ICD-10-CM

## 2020-12-20 NOTE — Therapy (Signed)
[place] @ [place], [place], [address] Phone: [phone]   Fax:  [phone]  Physical Therapy Treatment  Patient Details  Name: Tamara Osborne MRN: [id number] Date of Birth: Jul 31, 1975 Referring Provider (PT): [name], NP   Encounter Date: 12/20/2020   PT End of Session - 12/20/20 1347     Visit Number 11    Date for PT Re-Evaluation 01/29/21    Authorization Type Healthy blue    Authorization Time Period used 7 for prior treatment this year    Authorization - Visit Number 10    Authorization - Number of Visits 20    PT Start Time 1300    PT Stop Time 1345    PT Time Calculation (min) 45 min    Activity Tolerance Patient tolerated treatment well;No increased pain    Behavior During Therapy WFL for tasks assessed/performed             Past Medical History:  Diagnosis Date   Anemia    Anxiety    Bartholin cyst    Cervical cancer ([place])    Chronic kidney disease    Constipation    Depression    Deviated septum    Diabetes mellitus without complication ([place])    Difficult intravenous access    used port for 05-09-2019 surgery   Fibroids    History of blood transfusion    2 units given 04-26-2019, has had total of 8 units since jan 2021   History of blood transfusion 1 unit given 05-30-19   iv fluids also given   History of kidney problems    History of radiation therapy 03/23/2019-05/04/2019   Cervical external beam   Dr Gery Pray   History of radiation therapy 05/09/2019-06/05/2019   vaginal brachytherapy   Dr Gery Pray   History of recent blood transfusion 05/16/2019   2 units given  per dr Pollyann Savoy at cancer center   Hypertension    Neuropathy    both thumbs   Obesity    Palpitations    PONV (postoperative nausea and vomiting)    Prediabetes    Preeclampsia 2006   Sleep apnea    no cpap used insurance would not cover, osa severe per pt   SOB (shortness of breath)     Swallowing difficulty     Past Surgical History:  Procedure Laterality Date   CESAREAN SECTION WITH BILATERAL TUBAL LIGATION  11/10/2004       DILATION AND CURETTAGE OF UTERUS  1997   IR IMAGING GUIDED PORT INSERTION  04/04/2019   OPERATIVE ULTRASOUND N/A 05/09/2019   Procedure: OPERATIVE ULTRASOUND;  Surgeon: Gery Pray, MD;  Location: [place];  Service: Urology;  Laterality: N/A;   OPERATIVE ULTRASOUND N/A 05/15/2019   Procedure: OPERATIVE ULTRASOUND;  Surgeon: Gery Pray, MD;  Location: [facility];  Service: Urology;  Laterality: N/A;   OPERATIVE ULTRASOUND N/A 05/22/2019   Procedure: OPERATIVE ULTRASOUND;  Surgeon: Gery Pray, MD;  Location: [facility];  Service: Urology;  Laterality: N/A;   OPERATIVE ULTRASOUND N/A 05/29/2019   Procedure: OPERATIVE ULTRASOUND;  Surgeon: Gery Pray, MD;  Location: [facility];  Service: Urology;  Laterality: N/A;   OPERATIVE ULTRASOUND N/A 06/05/2019   Procedure: OPERATIVE ULTRASOUND;  Surgeon: Gery Pray, MD;  Location: [facility];  Service: Urology;  Laterality: N/A;   TANDEM RING INSERTION N/A 05/09/2019   Procedure: TANDEM RING INSERTION;  Surgeon: Gery Pray, MD;  Location: [facility];  Service: Urology;  Laterality: N/A;   TANDEM RING INSERTION N/A 05/15/2019   Procedure: TANDEM RING INSERTION;  Surgeon: Gery Pray, MD;  Location: [facility];  Service: Urology;  Laterality: N/A;   TANDEM RING INSERTION N/A 05/22/2019   Procedure: TANDEM RING INSERTION;  Surgeon: Gery Pray, MD;  Location: [facility];  Service: Urology;  Laterality: N/A;   TANDEM RING INSERTION N/A 05/29/2019   Procedure: TANDEM RING INSERTION;  Surgeon: Gery Pray, MD;  Location: [facility];  Service: Urology;  Laterality: N/A;   TANDEM RING INSERTION N/A 06/05/2019   Procedure: TANDEM RING INSERTION;  Surgeon: Gery Pray, MD;  Location: [facility];  Service: Urology;  Laterality: N/A;    There were no vitals filed for this visit.   Subjective Assessment - 12/20/20 1347     Subjective Down 6# per weight management visit this week. No current pain.    Currently in Pain? No/denies            Treatment: Patient seen for aquatic therapy today.  Treatment took place in water 2.5-4 feet deep depending upon activity.  Pt entered the pool via stairs, step to step with mild use of the hand rails. Pt requires buoyancy of water for support and to offload joints with strengthening exercises.  Water temp 94 degrees F.   Water walking in 75% submersion: water walking 10x in each direction with large noodle UE push/pull. High knee marching with no flotation 6 lengths, wall exercises for LE AROM, balance and hip strength 15 x Bil no UE required. Tandem stance Bil with UE movements with multi colored weights for horizontal abd/add 20x each side. Underwater bicycle increased to 2 min, 1 min rest: 4 bouts total with large noodle behind pt.                              PT Short Term Goals - 12/05/20 1352       PT SHORT TERM GOAL #1   Title independent with initial HEP    Time 4    Period Weeks    Status Achieved      PT SHORT TERM GOAL #2   Title educated on vaginal moisturizers to improve tissue integrity    Time 4    Period Weeks    Status Achieved               PT Long Term Goals - 12/19/20 1355       PT LONG TERM GOAL #1   Title independent with advanced exercises for strength, flexibility, and endurance    Time 12    Period Weeks    Status On-going      PT LONG TERM GOAL #2   Title urinary leakage decreased >/= 75% due to improved pelvic floor strength >/= 3/5 and not holding her breath    Baseline was able to go to a football game without a diaper on    Time 12    Period Weeks    Status On-going      PT LONG TERM GOAL #3   Title sit to stand  </= 11.5 seconds so she feels steadier on her feet    Baseline sit to stand 17 seconds    Time 12    Period Weeks    Status On-going  PT LONG TERM GOAL #4   Title ambulate with increased hip extension increased >/= 5 degrees to reduce risk of falls    Baseline hip extension -20 degrees bilaterally    Time 12    Period Weeks    Status On-going                    Patient will benefit from skilled therapeutic intervention in order to improve the following deficits and impairments:     Visit Diagnosis: Cramp and spasm  Other urinary incontinence  Other abnormalities of gait and mobility  Muscle weakness (generalized)     Problem List Patient Active Problem List   Diagnosis Date Noted   Type 2 diabetes mellitus without complication, without long-term current use of insulin ([place]) 11/15/2020   Incontinence of urine in female 09/09/2020   Onychomycosis of toenail 07/31/2020   OSA (obstructive sleep apnea) 07/31/2020   Prediabetes 05/27/2020   Sinus congestion 04/16/2020   Port-A-Cath in place 01/22/2020   Other hyperlipidemia 01/10/2020   Vitamin D deficiency 01/10/2020   Insulin resistance 01/10/2020   Class 3 severe obesity with serious comorbidity and body mass index (BMI) of 40.0 to 44.9 in adult ([place]) 01/10/2020   Obesity, Class II, BMI 35-39.9 09/07/2019   Dehydration 05/30/2019   Peripheral neuropathy due to chemotherapy ([place]) 04/25/2019   Nausea without vomiting 04/18/2019   Diarrhea 04/04/2019   CKD (chronic kidney disease), symptom management only, stage 3 (moderate) ([place]) 03/30/2019   Generalized anxiety disorder 03/30/2019   Hypertension associated with type 2 diabetes mellitus ([place])    Anxiety 03/23/2019   Squamous cell carcinoma of cervix ([place]) 03/23/2019   Deficiency anemia 03/22/2019   Squamous cell carcinoma in situ (SCCIS) of cervix 03/22/2019    Krisna Omar, PTA 12/20/2020, 4:00 PM  [place] @ [place], [place], [address] Phone: [phone]   Fax:  [phone]  Name: Tamara Osborne MRN: [id number] Date of Birth: 1975/05/23

## 2020-12-26 ENCOUNTER — Encounter: Payer: Self-pay | Admitting: Physical Therapy

## 2020-12-26 ENCOUNTER — Encounter: Payer: Medicaid Other | Admitting: Physical Therapy

## 2020-12-26 ENCOUNTER — Other Ambulatory Visit: Payer: Self-pay

## 2020-12-26 DIAGNOSIS — R2689 Other abnormalities of gait and mobility: Secondary | ICD-10-CM

## 2020-12-26 DIAGNOSIS — M6281 Muscle weakness (generalized): Secondary | ICD-10-CM

## 2020-12-26 DIAGNOSIS — N39498 Other specified urinary incontinence: Secondary | ICD-10-CM

## 2020-12-26 NOTE — Therapy (Signed)
[place] at [facility] [address] [place], [place], [address] Phone: [phone]   Fax:  [phone]  Physical Therapy Treatment  Patient Details  Name: Tamara Osborne MRN: [id number] Date of Birth: 07-29-1975 Referring Provider (PT): [name], NP   Encounter Date: 12/26/2020   PT End of Session - 12/26/20 0953     Visit Number 12    Date for PT Re-Evaluation 01/29/21    Authorization Type [employer]    Authorization Time Period used 7 for prior treatment this year    Authorization - Visit Number 11    Authorization - Number of Visits 20    PT Start Time 0930    PT Stop Time 3329    PT Time Calculation (min) 45 min    Activity Tolerance Patient tolerated treatment well;No increased pain    Behavior During Therapy WFL for tasks assessed/performed             Past Medical History:  Diagnosis Date   Anemia    Anxiety    Bartholin cyst    Cervical cancer ([place])    Chronic kidney disease    Constipation    Depression    Deviated septum    Diabetes mellitus without complication ([place])    Difficult intravenous access    used port for 05-09-2019 surgery   Fibroids    History of blood transfusion    2 units given 04-26-2019, has had total of 8 units since jan 2021   History of blood transfusion 1 unit given 05-30-19   iv fluids also given   History of kidney problems    History of radiation therapy 03/23/2019-05/04/2019   Cervical external beam   Dr Gery Pray   History of radiation therapy 05/09/2019-06/05/2019   vaginal brachytherapy   Dr Gery Pray   History of recent blood transfusion 05/16/2019   2 units given  per dr Pollyann Savoy at cancer center   Hypertension    Neuropathy    both thumbs   Obesity    Palpitations    PONV (postoperative nausea and vomiting)    Prediabetes    Preeclampsia 2006   Sleep apnea    no cpap used insurance would not cover, osa severe per pt   SOB (shortness of breath)     Swallowing difficulty     Past Surgical History:  Procedure Laterality Date   CESAREAN SECTION WITH BILATERAL TUBAL LIGATION  11/10/2004       DILATION AND CURETTAGE OF UTERUS  1997   IR IMAGING GUIDED PORT INSERTION  04/04/2019   OPERATIVE ULTRASOUND N/A 05/09/2019   Procedure: OPERATIVE ULTRASOUND;  Surgeon: Gery Pray, MD;  Location: [place];  Service: Urology;  Laterality: N/A;   OPERATIVE ULTRASOUND N/A 05/15/2019   Procedure: OPERATIVE ULTRASOUND;  Surgeon: Gery Pray, MD;  Location: [facility];  Service: Urology;  Laterality: N/A;   OPERATIVE ULTRASOUND N/A 05/22/2019   Procedure: OPERATIVE ULTRASOUND;  Surgeon: Gery Pray, MD;  Location: [facility];  Service: Urology;  Laterality: N/A;   OPERATIVE ULTRASOUND N/A 05/29/2019   Procedure: OPERATIVE ULTRASOUND;  Surgeon: Gery Pray, MD;  Location: [facility];  Service: Urology;  Laterality: N/A;   OPERATIVE ULTRASOUND N/A 06/05/2019   Procedure: OPERATIVE ULTRASOUND;  Surgeon: Gery Pray, MD;  [facility];  Service: Urology;  Laterality: N/A;   TANDEM RING INSERTION N/A 05/09/2019   Procedure: TANDEM RING INSERTION;  Surgeon:  Gery Pray, MD;  Location: [facility];  Service: Urology;  Laterality: N/A;   TANDEM RING INSERTION N/A 05/15/2019   Procedure: TANDEM RING INSERTION;  Surgeon: Gery Pray, MD;  Location: [facility];  Service: Urology;  Laterality: N/A;   TANDEM RING INSERTION N/A 05/22/2019   Procedure: TANDEM RING INSERTION;  Surgeon: Gery Pray, MD;  Location: [facility];  Service: Urology;  Laterality: N/A;   TANDEM RING INSERTION N/A 05/29/2019   Procedure: TANDEM RING INSERTION;  Surgeon: Gery Pray, MD;  Location: [facility];  Service: Urology;  Laterality: N/A;   TANDEM RING INSERTION N/A 06/05/2019   Procedure: TANDEM RING INSERTION;  Surgeon: Gery Pray, MD;  Location: [facility];  Service: Urology;  Laterality: N/A;    There were no vitals filed for this visit.   Subjective Assessment - 12/26/20 0934     Subjective Over the weekend I had a sneezing fit and I leaked.    Patient Stated Goals reduce urinary leakage    Currently in Pain? No/denies                [facility] PT Assessment - 12/26/20 0001       Assessment   Medical Diagnosis N39.498 [place] urinary incontinence    Referring Provider (PT) [name], NP    Onset Date/Surgical Date 08/30/20    Prior Therapy yes and ended on last visit on 05/16/2020      Precautions   Precautions Other (comment)    Precaution Comments cervical cancer with radiation      Restrictions   Weight Bearing Restrictions No      [place] residence      Prior Function   Level of Independence Independent    Leisure water aerobics      Cognition   Overall Cognitive Status Within Functional Limits for tasks assessed      Observation/Other Assessments   Focus on Therapeutic Outcomes (FOTO)  PFIQ-7 14; UIQ-7 0; POPIQ-7 14      ROM / Strength   AROM / PROM / Strength AROM;PROM;Strength      AROM   Lumbar Flexion full    Lumbar Extension full    Lumbar - Right Side Bend full    Lumbar - Left Side Bend full    Lumbar - Right Rotation full    Lumbar - Left Rotation full      PROM   Right Hip Extension 10    Right Hip ABduction 30    Left Hip Extension 10    Left Hip ABduction 30      Strength   Right Hip Extension 4+/5    Right Hip ABduction 4+/5    Left Hip Extension 4/5    Left Hip ABduction 4+/5      Standardized Balance Assessment   Standardized Balance Assessment Five Times Sit to Stand    Five times sit to stand comments  14 sec, 12 sec, 11 sec                        Pelvic Floor Special Questions - 12/26/20 0001     Pelvic Floor Internal Exam Patient confirms identificaiton and approves PT to  assess pelvic floor and treatment    Exam Type Vaginal    Strength fair squeeze, definite lift               [facility]  Adult PT Treatment/Exercise - 12/26/20 0001       Manual Therapy   Manual Therapy Internal Pelvic Floor    Internal Pelvic Floor manual work to the right side of the bladder, along the aTLA, along the ischial spinous process, and on the vaginal vault to elongate the tissue                       PT Short Term Goals - 12/05/20 1352       PT SHORT TERM GOAL #1   Title independent with initial HEP    Time 4    Period Weeks    Status Achieved      PT SHORT TERM GOAL #2   Title educated on vaginal moisturizers to improve tissue integrity    Time 4    Period Weeks    Status Achieved               PT Long Term Goals - 12/26/20 2423       PT LONG TERM GOAL #1   Title independent with advanced exercises for strength, flexibility, and endurance    Time 12    Period Weeks    Status On-going      PT LONG TERM GOAL #2   Title urinary leakage decreased >/= 75% due to improved pelvic floor strength >/= 3/5 and not holding her breath    Time 12    Period Weeks    Status On-going      PT LONG TERM GOAL #3   Title sit to stand </= 11.5 seconds so she feels steadier on her feet    Baseline able to do in 11 sec    Time 12    Period Weeks    Status Achieved      PT LONG TERM GOAL #4   Title ambulate with increased hip extension increased >/= 5 degrees to reduce risk of falls    Time 12    Period Weeks    Status Achieved                   Plan - 12/26/20 0954     Clinical Impression Statement Patient sit to stand score has imprved to 11 seconds compared to 17 seconds. Her UIQ-7 is now 0 and PFIQ-7 has reduced from 52 to 14. Patient has increased lumbar and hp ROM. She is able to walk with decreased trunk sway and increased ankle dorsiflexion. Patient will have some pain with the dilators when she reaches the top of the vaginal  vault. She has leaked when she had a sneezing fit. Patient reports some pelvic pain if she is doing too much activity. Patient vaginal tissue looks healthy and good moisture. Patient had done well in aquatics program and has two more session then will be discharged.    Personal Factors and Comorbidities Fitness;Comorbidity 3+;Time since onset of injury/illness/exacerbation    Comorbidities cervical cancer with radiation on 03/23/2019-05/04/2019 and 05/09/2019-06/15/2019 ; diabetes; c-section 11/10/2004    Examination-Activity Limitations Locomotion Level;Sleep;Continence;Toileting;Bend    Examination-Participation Restrictions Community Activity    Stability/Clinical Decision Making Evolving/Moderate complexity    Rehab Potential Good    PT Treatment/Interventions ADLs/Self Care Home Management;Biofeedback;Therapeutic activities;Therapeutic exercise;Balance training;Neuromuscular re-education;Patient/family education;Manual techniques;Passive range of motion;Dry needling;Spinal Manipulations;Joint Manipulations;Aquatic Therapy    PT Next Visit Plan when in the pool working on balance, hip  extension and trunk mobility, and core strength; 2 more aquatics sessions then discharge    PT  Home Exercise Plan Access Code: [email or web address]    [email or web address] and Agree with Plan of Care Patient             Patient will benefit from skilled therapeutic intervention in order to improve the following deficits and impairments:  Abnormal gait, Decreased coordination, Decreased range of motion, Difficulty walking, Increased fascial restricitons, Increased muscle spasms, Pain, Decreased strength, Decreased mobility, Decreased balance, Impaired flexibility  Visit Diagnosis: Other urinary incontinence  Other abnormalities of gait and mobility  Muscle weakness (generalized)     Problem List Patient Active Problem List   Diagnosis Date Noted   Type 2 diabetes mellitus without complication, without long-term current use of  insulin ([place]) 11/15/2020   Incontinence of urine in female 09/09/2020   Onychomycosis of toenail 07/31/2020   OSA (obstructive sleep apnea) 07/31/2020   Prediabetes 05/27/2020   Sinus congestion 04/16/2020   Port-A-Cath in place 01/22/2020   Other hyperlipidemia 01/10/2020   Vitamin D deficiency 01/10/2020   Insulin resistance 01/10/2020   Class 3 severe obesity with serious comorbidity and body mass index (BMI) of 40.0 to 44.9 in adult ([place]) 01/10/2020   Obesity, Class II, BMI 35-39.9 09/07/2019   Dehydration 05/30/2019   Peripheral neuropathy due to chemotherapy ([place]) 04/25/2019   Nausea without vomiting 04/18/2019   Diarrhea 04/04/2019   CKD (chronic kidney disease), symptom management only, stage 3 (moderate) ([place]) 03/30/2019   Generalized anxiety disorder 03/30/2019   Hypertension associated with type 2 diabetes mellitus ([place])    Anxiety 03/23/2019   Squamous cell carcinoma of cervix ([place]) 03/23/2019   Deficiency anemia 03/22/2019   Squamous cell carcinoma in situ (SCCIS) of cervix 03/22/2019    Earlie Counts, PT 12/26/20 10:23 AM  [facility] at [facility] [address] [place], [place], [address] Phone: [phone]   Fax:  [phone]  Name: BRITTISH BOLINGER MRN: [id number] Date of Birth: [birth year]

## 2020-12-27 ENCOUNTER — Ambulatory Visit: Payer: Medicaid Other | Admitting: Physical Therapy

## 2020-12-27 DIAGNOSIS — N39498 Other specified urinary incontinence: Secondary | ICD-10-CM

## 2020-12-27 DIAGNOSIS — M6281 Muscle weakness (generalized): Secondary | ICD-10-CM

## 2020-12-27 DIAGNOSIS — R252 Cramp and spasm: Secondary | ICD-10-CM

## 2020-12-27 DIAGNOSIS — R2689 Other abnormalities of gait and mobility: Secondary | ICD-10-CM

## 2020-12-27 NOTE — Therapy (Signed)
[place] @ [place], [place], [address] Phone: [phone]   Fax:  [phone]  Physical Therapy Treatment  Patient Details  Name: Tamara Osborne MRN: [id number] Date of Birth: 03/18/1975 Referring Provider (PT): [name], NP   Encounter Date: 12/27/2020   PT End of Session - 12/27/20 1344     Visit Number 13    Date for PT Re-Evaluation 01/29/21    Authorization Type Healthy blue    Authorization Time Period used 7 for prior treatment this year    Authorization - Visit Number 12    Authorization - Number of Visits 20    PT Start Time 8032    PT Stop Time 1339    PT Time Calculation (min) 44 min    Activity Tolerance Patient tolerated treatment well;No increased pain    Behavior During Therapy WFL for tasks assessed/performed             Past Medical History:  Diagnosis Date   Anemia    Anxiety    Bartholin cyst    Cervical cancer ([place])    Chronic kidney disease    Constipation    Depression    Deviated septum    Diabetes mellitus without complication ([place])    Difficult intravenous access    used port for 05-09-2019 surgery   Fibroids    History of blood transfusion    2 units given 04-26-2019, has had total of 8 units since jan 2021   History of blood transfusion 1 unit given 05-30-19   iv fluids also given   History of kidney problems    History of radiation therapy 03/23/2019-05/04/2019   Cervical external beam   Dr Gery Pray   History of radiation therapy 05/09/2019-06/05/2019   vaginal brachytherapy   Dr Gery Pray   History of recent blood transfusion 05/16/2019   2 units given  per dr Pollyann Savoy at cancer center   Hypertension    Neuropathy    both thumbs   Obesity    Palpitations    PONV (postoperative nausea and vomiting)    Prediabetes    Preeclampsia 2006   Sleep apnea    no cpap used insurance would not cover, osa severe per pt   SOB (shortness of breath)     Swallowing difficulty     Past Surgical History:  Procedure Laterality Date   CESAREAN SECTION WITH BILATERAL TUBAL LIGATION  11/10/2004       DILATION AND CURETTAGE OF UTERUS  1997   IR IMAGING GUIDED PORT INSERTION  04/04/2019   OPERATIVE ULTRASOUND N/A 05/09/2019   Procedure: OPERATIVE ULTRASOUND;  Surgeon: Gery Pray, MD;  Location: [place];  Service: Urology;  Laterality: N/A;   OPERATIVE ULTRASOUND N/A 05/15/2019   Procedure: OPERATIVE ULTRASOUND;  Surgeon: Gery Pray, MD;  Location: [facility];  Service: Urology;  Laterality: N/A;   OPERATIVE ULTRASOUND N/A 05/22/2019   Procedure: OPERATIVE ULTRASOUND;  Surgeon: Gery Pray, MD;  Location: [facility];  Service: Urology;  Laterality: N/A;   OPERATIVE ULTRASOUND N/A 05/29/2019   Procedure: OPERATIVE ULTRASOUND;  Surgeon: Gery Pray, MD;  Location: [facility];  Service: Urology;  Laterality: N/A;   OPERATIVE ULTRASOUND N/A 06/05/2019   Procedure: OPERATIVE ULTRASOUND;  Surgeon: Gery Pray, MD;  Location: [facility];  Service: Urology;  Laterality: N/A;   TANDEM RING INSERTION N/A 05/09/2019   Procedure: TANDEM RING INSERTION;  Surgeon:  Gery Pray, MD;  Location: [facility];  Service: Urology;  Laterality: N/A;   TANDEM RING INSERTION N/A 05/15/2019   Procedure: TANDEM RING INSERTION;  Surgeon: Gery Pray, MD;  Location: [facility];  Service: Urology;  Laterality: N/A;   TANDEM RING INSERTION N/A 05/22/2019   Procedure: TANDEM RING INSERTION;  Surgeon: Gery Pray, MD;  Location: [facility];  Service: Urology;  Laterality: N/A;   TANDEM RING INSERTION N/A 05/29/2019   Procedure: TANDEM RING INSERTION;  Surgeon: Gery Pray, MD;  Location: [facility];  Service: Urology;  Laterality: N/A;   TANDEM RING INSERTION N/A 06/05/2019   Procedure: TANDEM RING INSERTION;  Surgeon: Gery Pray, MD;  Location: [facility];  Service: Urology;  Laterality: N/A;    There were no vitals filed for this visit. Treatment: Patient seen for aquatic therapy today.  Treatment took place in water 2.5-4 feet deep depending upon activity.  Pt entered the pool via stairs, reciprocally with mild use of hand rails. Water temp 94 degrees F. Pt requires buoyancy of water for support and to offload joints with strengthening exercises.  Pt utilizes viscosity of the water required for strengthening.   Standing in 75% depth: Water walking with large noodle for UE push & pull 10x in each direction. Side step with mitten hands and mini squat /jumping jack UE. High knee marching with large noodle 6 lengths. Tandem stance both sides with yellow UE weights for UE horizontal abd/add 2 min each side. 3 finger support at wall for bil hip 3 ways with VC to push and pull against water more 20x each. Underwater bicycle with large noodle behind pt: increased to 3 min 4 total bouts. 1 min rest breal in between for LE decompression.                                PT Short Term Goals - 12/05/20 1352       PT SHORT TERM GOAL #1   Title independent with initial HEP    Time 4    Period Weeks    Status Achieved      PT SHORT TERM GOAL #2   Title educated on vaginal moisturizers to improve tissue integrity    Time 4    Period Weeks    Status Achieved               PT Long Term Goals - 12/26/20 3810       PT LONG TERM GOAL #1   Title independent with advanced exercises for strength, flexibility, and endurance    Time 12    Period Weeks    Status On-going      PT LONG TERM GOAL #2   Title urinary leakage decreased >/= 75% due to improved pelvic floor strength >/= 3/5 and not holding her breath    Time 12    Period Weeks    Status On-going      PT LONG TERM GOAL #3   Title sit to stand </= 11.5 seconds so she feels steadier on her feet    Baseline able to  do in 11 sec    Time 12    Period Weeks    Status Achieved      PT LONG TERM GOAL #4   Title ambulate with increased hip extension increased >/= 5 degrees to reduce risk of falls  Time 12    Period Weeks    Status Achieved                    Patient will benefit from skilled therapeutic intervention in order to improve the following deficits and impairments:     Visit Diagnosis: Other urinary incontinence  Other abnormalities of gait and mobility  Muscle weakness (generalized)  Cramp and spasm     Problem List Patient Active Problem List   Diagnosis Date Noted   Type 2 diabetes mellitus without complication, without long-term current use of insulin ([place]) 11/15/2020   Incontinence of urine in female 09/09/2020   Onychomycosis of toenail 07/31/2020   OSA (obstructive sleep apnea) 07/31/2020   Prediabetes 05/27/2020   Sinus congestion 04/16/2020   Port-A-Cath in place 01/22/2020   Other hyperlipidemia 01/10/2020   Vitamin D deficiency 01/10/2020   Insulin resistance 01/10/2020   Class 3 severe obesity with serious comorbidity and body mass index (BMI) of 40.0 to 44.9 in adult ([place]) 01/10/2020   Obesity, Class II, BMI 35-39.9 09/07/2019   Dehydration 05/30/2019   Peripheral neuropathy due to chemotherapy ([place]) 04/25/2019   Nausea without vomiting 04/18/2019   Diarrhea 04/04/2019   CKD (chronic kidney disease), symptom management only, stage 3 (moderate) ([place]) 03/30/2019   Generalized anxiety disorder 03/30/2019   Hypertension associated with type 2 diabetes mellitus ([place])    Anxiety 03/23/2019   Squamous cell carcinoma of cervix ([place]) 03/23/2019   Deficiency anemia 03/22/2019   Squamous cell carcinoma in situ (SCCIS) of cervix 03/22/2019    Tiaria Biby, PTA 12/27/2020, 3:20 PM  [place] @ [place], [place], [address] Phone: [phone]   Fax:  [phone]  Name: ROSELINE EBARB MRN: [id number] Date of Birth: 1975-11-15

## 2020-12-31 ENCOUNTER — Ambulatory Visit: Payer: Medicaid Other | Admitting: Pharmacist

## 2021-01-10 ENCOUNTER — Encounter: Payer: Self-pay | Admitting: Physical Therapy

## 2021-01-10 ENCOUNTER — Other Ambulatory Visit: Payer: Self-pay

## 2021-01-10 ENCOUNTER — Ambulatory Visit: Payer: Medicaid Other | Attending: Gynecologic Oncology | Admitting: Physical Therapy

## 2021-01-10 DIAGNOSIS — N39498 Other specified urinary incontinence: Secondary | ICD-10-CM | POA: Diagnosis present

## 2021-01-10 DIAGNOSIS — R2689 Other abnormalities of gait and mobility: Secondary | ICD-10-CM

## 2021-01-10 DIAGNOSIS — M6281 Muscle weakness (generalized): Secondary | ICD-10-CM | POA: Diagnosis present

## 2021-01-10 DIAGNOSIS — R252 Cramp and spasm: Secondary | ICD-10-CM | POA: Diagnosis present

## 2021-01-10 NOTE — Therapy (Addendum)
[place] @ [facility] [place], [place], [address] Phone: [phone]   Fax:  [phone]  Physical Therapy Treatment  Patient Details  Name: Tamara Osborne MRN: [id number] Date of Birth: 1975-08-29 Referring Provider (PT): [name], NP   Encounter Date: 01/10/2021   PT End of Session - 01/10/21 1549     Visit Number 14    Date for PT Re-Evaluation 01/29/21    Authorization Type [employer]    Authorization Time Period used 7 for prior treatment this year    Authorization - Visit Number 13    Authorization - Number of Visits 20    PT Start Time 1200    PT Stop Time 0814    PT Time Calculation (min) 55 min    Activity Tolerance Patient tolerated treatment well;No increased pain    Behavior During Therapy WFL for tasks assessed/performed             Past Medical History:  Diagnosis Date   Anemia    Anxiety    Bartholin cyst    Cervical cancer ([place])    Chronic kidney disease    Constipation    Depression    Deviated septum    Diabetes mellitus without complication ([place])    Difficult intravenous access    used port for 05-09-2019 surgery   Fibroids    History of blood transfusion    2 units given 04-26-2019, has had total of 8 units since jan 2021   History of blood transfusion 1 unit given 05-30-19   iv fluids also given   History of kidney problems    History of radiation therapy 03/23/2019-05/04/2019   Cervical external beam   Dr Gery Pray   History of radiation therapy 05/09/2019-06/05/2019   vaginal brachytherapy   Dr Gery Pray   History of recent blood transfusion 05/16/2019   2 units given  per dr Pollyann Savoy at cancer center   Hypertension    Neuropathy    both thumbs   Obesity    Palpitations    PONV (postoperative nausea and vomiting)    Prediabetes    Preeclampsia 2006   Sleep apnea    no cpap used insurance would not cover, osa severe per pt   SOB (shortness of breath)     Swallowing difficulty     Past Surgical History:  Procedure Laterality Date   CESAREAN SECTION WITH BILATERAL TUBAL LIGATION  11/10/2004       DILATION AND CURETTAGE OF UTERUS  1997   IR IMAGING GUIDED PORT INSERTION  04/04/2019   OPERATIVE ULTRASOUND N/A 05/09/2019   Procedure: OPERATIVE ULTRASOUND;  Surgeon: Gery Pray, MD;  Location: [facility];  Service: Urology;  Laterality: N/A;   OPERATIVE ULTRASOUND N/A 05/15/2019   Procedure: OPERATIVE ULTRASOUND;  Surgeon: Gery Pray, MD;  Location: [facility];  Service: Urology;  Laterality: N/A;   OPERATIVE ULTRASOUND N/A 05/22/2019   Procedure: OPERATIVE ULTRASOUND;  Surgeon: Gery Pray, MD;  Location: [facility];  Service: Urology;  Laterality: N/A;   OPERATIVE ULTRASOUND N/A 05/29/2019   Procedure: OPERATIVE ULTRASOUND;  Surgeon: Gery Pray, MD;  Location: [facility];  Service: Urology;  Laterality: N/A;   OPERATIVE ULTRASOUND N/A 06/05/2019   Procedure: OPERATIVE ULTRASOUND;  Surgeon: Gery Pray, MD;  Location: [facility];  Service: Urology;  Laterality: N/A;   TANDEM RING INSERTION N/A 05/09/2019   Procedure: TANDEM RING INSERTION;  Surgeon:  Gery Pray, MD;  Location[facility];  Service: Urology;  Laterality: N/A;   TANDEM RING INSERTION N/A 05/15/2019   Procedure: TANDEM RING INSERTION;  Surgeon: Gery Pray, MD;  Location: [facility];  Service: Urology;  Laterality: N/A;   TANDEM RING INSERTION N/A 05/22/2019   Procedure: TANDEM RING INSERTION;  Surgeon: Gery Pray, MD;  Location: [facility];  Service: Urology;  Laterality: N/A;   TANDEM RING INSERTION N/A 05/29/2019   Procedure: TANDEM RING INSERTION;  Surgeon: Gery Pray, MD;  Location: [facility];  Service: Urology;  Laterality: N/A;   TANDEM RING INSERTION N/A 06/05/2019   Procedure: TANDEM RING INSERTION;  Surgeon: Gery Pray, MD;  Location: [facility];  Service: Urology;  Laterality: N/A;    There were no vitals filed for this visit.   Subjective Assessment - 01/10/21 1548     Subjective Went out of town for the first time in a very long term and I did excellent. Very tired at end of trip but expected.    Currently in Pain? No/denies    Multiple Pain Sites No            Treatment: Patient seen for aquatic therapy today.  Treatment took place in water 2.5-4 feet deep depending upon activity.  Pt entered the pool via steps, reciprocally with mild use of rails. Pt requires buoyancy of water for support and to offload joints with strengthening exercises.  Pt utilizes viscosity of the water required for strengthening. Water temp 94 degrees F.  Standing 75% submersion: Walking all directions 10x using push/pull with large noodle for forward & back. Tandem stance Bil with mitten hands with resisted shoulder flex/ext 20x, high knee marching 6 lengths with medium noodle: VC to keep abdominals tight. Hip abduction & circumduction 20x Bil no UE, Underwater bicycle with one noodle behind pt: 3 min x4 bouts, Pt requires 1 min rest break in between.                               PT Short Term Goals - 12/05/20 1352       PT SHORT TERM GOAL #1   Title independent with initial HEP    Time 4    Period Weeks    Status Achieved      PT SHORT TERM GOAL #2   Title educated on vaginal moisturizers to improve tissue integrity    Time 4    Period Weeks    Status Achieved               PT Long Term Goals - 12/26/20 5277       PT LONG TERM GOAL #1   Title independent with advanced exercises for strength, flexibility, and endurance    Time 12    Period Weeks    Status On-going      PT LONG TERM GOAL #2   Title urinary leakage decreased >/= 75% due to improved pelvic floor strength >/= 3/5 and not holding her breath    Time 12    Period Weeks    Status On-going       PT LONG TERM GOAL #3   Title sit to stand </= 11.5 seconds so she feels steadier on her feet    Baseline able to do in 11 sec    Time 12    Period Weeks    Status  Achieved      PT LONG TERM GOAL #4   Title ambulate with increased hip extension increased >/= 5 degrees to reduce risk of falls    Time 12    Period Weeks    Status Achieved            All long term goals have been met.  Earlie Counts, PT 02/04/21 2:19 PM        Plan - 01/10/21 1550     Clinical Impression Statement Pt was able to drive 8 hours 2x in 3 days inddependently and go on a trip, feeling good, no pain or excessive fatigue. Pt using CPAP to sleep, sleeping well. Pt is planning on working 1 week each month for the [employer].    Personal Factors and Comorbidities Fitness;Comorbidity 3+;Time since onset of injury/illness/exacerbation    Comorbidities cervical cancer with radiation on 03/23/2019-05/04/2019 and 05/09/2019-06/15/2019 ; diabetes; c-section 11/10/2004    Examination-Activity Limitations Locomotion Level;Sleep;Continence;Toileting;Bend    Examination-Participation Restrictions Community Activity    Stability/Clinical Decision Making Evolving/Moderate complexity    Rehab Potential Good    PT Frequency 2x / week    PT Duration 12 weeks    PT Treatment/Interventions ADLs/Self Care Home Management;Biofeedback;Therapeutic activities;Therapeutic exercise;Balance training;Neuromuscular re-education;Patient/family education;Manual techniques;Passive range of motion;Dry needling;Spinal Manipulations;Joint Manipulations;Aquatic Therapy    PT Next Visit Plan Aquatics 1 more visit.    PT Home Exercise Plan Access Code: [email or web address]    Consulted and Agree with Plan of Care Patient             Patient will benefit from skilled therapeutic intervention in order to improve the following deficits and impairments:  Abnormal gait, Decreased coordination, Decreased range of motion, Difficulty walking, Increased  fascial restricitons, Increased muscle spasms, Pain, Decreased strength, Decreased mobility, Decreased balance, Impaired flexibility  Visit Diagnosis: Other urinary incontinence  Other abnormalities of gait and mobility  Cramp and spasm  Muscle weakness (generalized)     Problem List Patient Active Problem List   Diagnosis Date Noted   Type 2 diabetes mellitus without complication, without long-term current use of insulin ([place]) 11/15/2020   Incontinence of urine in female 09/09/2020   Onychomycosis of toenail 07/31/2020   OSA (obstructive sleep apnea) 07/31/2020   Prediabetes 05/27/2020   Sinus congestion 04/16/2020   Port-A-Cath in place 01/22/2020   Other hyperlipidemia 01/10/2020   Vitamin D deficiency 01/10/2020   Insulin resistance 01/10/2020   Class 3 severe obesity with serious comorbidity and body mass index (BMI) of 40.0 to 44.9 in adult ([place]) 01/10/2020   Obesity, Class II, BMI 35-39.9 09/07/2019   Dehydration 05/30/2019   Peripheral neuropathy due to chemotherapy ([place]) 04/25/2019   Nausea without vomiting 04/18/2019   Diarrhea 04/04/2019   CKD (chronic kidney disease), symptom management only, stage 3 (moderate) ([place]) 03/30/2019   Generalized anxiety disorder 03/30/2019   Hypertension associated with type 2 diabetes mellitus ([place])    Anxiety 03/23/2019   Squamous cell carcinoma of cervix ([place]) 03/23/2019   Deficiency anemia 03/22/2019   Squamous cell carcinoma in situ (SCCIS) of cervix 03/22/2019    Rossanna Spitzley, PTA 01/10/2021, 3:53 PM  [facility] @ [place] [address], [place], [address] Phone: [phone]   Fax:  [phone]  Name: MARLANE HIRSCHMANN MRN: [id number] Date of Birth: 1975-07-03  PHYSICAL THERAPY DISCHARGE SUMMARY  Visits from Start of Care: 14  Current functional level related to goals / functional outcomes: See above. All goals have been met  Remaining deficits: See  above.    Education / Equipment: HEP   Patient agrees to discharge. Patient goals were met. Patient is being discharged due to meeting the stated rehab goals. Thank you for the referral. Earlie Counts, PT 02/04/21 2:20 PM

## 2021-01-12 NOTE — Progress Notes (Signed)
PCP: Juluis Mire  S:     Patient arrives in good spirits.  Presents for diabetes evaluation, education, and management. Patient was last seen by CPP on 12/03/2020. Patient was referred on 10/23/2020 by Juluis Mire.  Patient was last seen by Mina Marble (primary care) on 12/17/2020.   Patient reports Diabetes was diagnosed in August this year after being prediabetic for years.   Family/Social History:  -Mother: brain cancer, HTN, depression, anxiety -Father: DM, CKD, cancer -Sister: heart failure -Never smoker  Human resources officer affordability: [place] Medicaid  Medication adherence reported .   Current diabetes medications include:  -Victoza 1.8 mg daily -Metformin XR 500 mg daily  Current hypertension medications include:  -Amlodipine-valsartan 10-320 mg daily  Current hyperlipidemia medications include:  -none  Patient denies hypoglycemic events.  Patient reported dietary habits: Eats 3 meals/day Patient reports vastly improved diet is working hard to understand what foods are best for her. Further education provided.  Patient-reported exercise habits: Water aerobics twice a week and PT every Friday   Patient denies nocturia (nighttime urination).  Patient reports neuropathy (nerve pain) unchanged since last visit. Water therapy has helped. Patient denies visual changes, however patient reports "fuzzy" vision at night occassionally. Patient reports self foot exams.     O:   Lab Results  Component Value Date   HGBA1C 5.2 01/13/2021   There were no vitals filed for this visit.  Lipid Panel     Component Value Date/Time   CHOL 214 (H) 05/20/2020 1415   CHOL 222 (H) 09/20/2019 1106   TRIG 184 (H) 05/20/2020 1415   HDL 32 (L) 05/20/2020 1415   HDL 34 (L) 09/20/2019 1106   CHOLHDL 6.7 05/20/2020 1415   VLDL 37 05/20/2020 1415   LDLCALC 145 (H) 05/20/2020 1415   LDLCALC 158 (H) 09/20/2019 1106   POCT BG in clinic 100 mg/dL Home fasting  blood sugars: 90s-120  2 hour post-meal/random blood sugars: "highest I've seen is 120".  Clinical Atherosclerotic Cardiovascular Disease (ASCVD): No  The 10-year ASCVD risk score (Arnett DK, et al., 2019) is: 5.9%   Values used to calculate the score:     Age: 45 years     Sex: Female     Is Non-Hispanic African American: No     Diabetic: Yes     Tobacco smoker: No     Systolic Blood Pressure: 614 mmHg     Is BP treated: Yes     HDL Cholesterol: 32 mg/dL     Total Cholesterol: 214 mg/dL   A/P: Diabetes recently diagnosed,  currently well controlled. Patient is able to verbalize appropriate hypoglycemia management plan. Medication adherence appears adequate.  -Continue Victoza 1.8 mg daily. Patient reported some issues with daily injections and site soreness. May consider once weekly injection if this continues to be problematic. -Continue Metformin XR 500 mg daily as renal function remains near baseline.  -Patient reported no history of UTI or yeast infections. SGLT2i may be indicated next visit for CKD -Extensively discussed pathophysiology of diabetes, recommended lifestyle interventions, dietary effects on blood sugar control -Counseled on s/sx of and management of hypoglycemia -Next A1C anticipated 04/15/2020.   ASCVD risk - primary prevention in patient with diabetes. Last LDL is not controlled. ASCVD risk score is not >20%  - moderate intensity statin indicated.  -Due to time constrains with back to back appointments scheduled, patient was unable to discuss lipid treatment at this time. Will likely need statin treatment and a lipid panel.  Hypertension  longstanding currently close to goal.  Blood pressure goal = <130/80 mmHg. Medication adherence reported.   -Continue current regimen  Written patient instructions provided.  Total time in face to face counseling 33 minutes.   Follow up Pharmacist at the end of November. Will likely need lipid panel and will consider statin  initiation.  Thank you for allowing pharmacy to participate in this patient's care.  Reatha Harps, PharmD PGY1 Pharmacy Resident 01/13/2021 2:46 PM

## 2021-01-13 ENCOUNTER — Encounter (INDEPENDENT_AMBULATORY_CARE_PROVIDER_SITE_OTHER): Payer: Self-pay | Admitting: Adult Health

## 2021-01-13 ENCOUNTER — Encounter: Payer: Self-pay | Admitting: Pharmacist

## 2021-01-13 ENCOUNTER — Ambulatory Visit: Payer: Medicaid Other | Attending: Primary Care | Admitting: Pharmacist

## 2021-01-13 ENCOUNTER — Ambulatory Visit (INDEPENDENT_AMBULATORY_CARE_PROVIDER_SITE_OTHER): Payer: Medicaid Other | Admitting: Adult Health

## 2021-01-13 ENCOUNTER — Encounter (INDEPENDENT_AMBULATORY_CARE_PROVIDER_SITE_OTHER): Payer: Self-pay

## 2021-01-13 ENCOUNTER — Other Ambulatory Visit: Payer: Self-pay

## 2021-01-13 DIAGNOSIS — E119 Type 2 diabetes mellitus without complications: Secondary | ICD-10-CM | POA: Diagnosis not present

## 2021-01-13 LAB — POCT GLYCOSYLATED HEMOGLOBIN (HGB A1C): HbA1c, POC (controlled diabetic range): 5.2 % (ref 0.0–7.0)

## 2021-01-17 ENCOUNTER — Other Ambulatory Visit: Payer: Self-pay

## 2021-01-17 ENCOUNTER — Encounter: Payer: Self-pay | Admitting: Physical Therapy

## 2021-01-17 ENCOUNTER — Ambulatory Visit: Payer: Medicaid Other | Admitting: Physical Therapy

## 2021-01-17 DIAGNOSIS — N39498 Other specified urinary incontinence: Secondary | ICD-10-CM

## 2021-01-17 DIAGNOSIS — R252 Cramp and spasm: Secondary | ICD-10-CM

## 2021-01-17 DIAGNOSIS — R2689 Other abnormalities of gait and mobility: Secondary | ICD-10-CM

## 2021-01-17 DIAGNOSIS — M6281 Muscle weakness (generalized): Secondary | ICD-10-CM

## 2021-01-17 NOTE — Therapy (Signed)
[place] @ [address] [place], [place], [address] Phone: [phone]   Fax:  [phone]  Physical Therapy Treatment  Patient Details  Name: Tamara Osborne MRN: [id number] Date of Birth: 1976-02-06 Referring Provider (PT): [name], NP   Encounter Date: 01/17/2021   PT End of Session - 01/17/21 1211     Visit Number 15    Date for PT Re-Evaluation 01/29/21    Authorization Type Healthy blue    Authorization - Number of Visits 20    PT Start Time 1209    Activity Tolerance Patient tolerated treatment well;No increased pain    Behavior During Therapy WFL for tasks assessed/performed             Past Medical History:  Diagnosis Date   Anemia    Anxiety    Bartholin cyst    Cervical cancer ([place])    Chronic kidney disease    Constipation    Depression    Deviated septum    Diabetes mellitus without complication ([place])    Difficult intravenous access    used port for 05-09-2019 surgery   Fibroids    History of blood transfusion    2 units given 04-26-2019, has had total of 8 units since jan 2021   History of blood transfusion 1 unit given 05-30-19   iv fluids also given   History of kidney problems    History of radiation therapy 03/23/2019-05/04/2019   Cervical external beam   Dr Gery Pray   History of radiation therapy 05/09/2019-06/05/2019   vaginal brachytherapy   Dr Gery Pray   History of recent blood transfusion 05/16/2019   2 units given  per dr Pollyann Savoy at cancer center   Hypertension    Neuropathy    both thumbs   Obesity    Palpitations    PONV (postoperative nausea and vomiting)    Prediabetes    Preeclampsia 2006   Sleep apnea    no cpap used insurance would not cover, osa severe per pt   SOB (shortness of breath)    Swallowing difficulty     Past Surgical History:  Procedure Laterality Date   CESAREAN SECTION WITH BILATERAL TUBAL LIGATION  11/10/2004       DILATION AND  CURETTAGE OF UTERUS  1997   IR IMAGING GUIDED PORT INSERTION  04/04/2019   OPERATIVE ULTRASOUND N/A 05/09/2019   Procedure: OPERATIVE ULTRASOUND;  Surgeon: Gery Pray, MD;  Location: [facility];  Service: Urology;  Laterality: N/A;   OPERATIVE ULTRASOUND N/A 05/15/2019   Procedure: OPERATIVE ULTRASOUND;  Surgeon: Gery Pray, MD;  Location: [facility];  Service: Urology;  Laterality: N/A;   OPERATIVE ULTRASOUND N/A 05/22/2019   Procedure: OPERATIVE ULTRASOUND;  Surgeon: Gery Pray, MD;  Location: [facility];  Service: Urology;  Laterality: N/A;   OPERATIVE ULTRASOUND N/A 05/29/2019   Procedure: OPERATIVE ULTRASOUND;  Surgeon: Gery Pray, MD;  Location: [facility];  Service: Urology;  Laterality: N/A;   OPERATIVE ULTRASOUND N/A 06/05/2019   Procedure: OPERATIVE ULTRASOUND;  Surgeon: Gery Pray, MD;  Location: [facility];  Service: Urology;  Laterality: N/A;   TANDEM RING INSERTION N/A 05/09/2019   Procedure: TANDEM RING INSERTION;  Surgeon: Gery Pray, MD;  Location: [facility];  Service: Urology;  Laterality: N/A;   TANDEM RING INSERTION N/A 05/15/2019   Procedure: TANDEM RING INSERTION;  Surgeon: Gery Pray, MD;  Location: [facility];  Service: Urology;  Laterality: N/A;   TANDEM RING INSERTION N/A 05/22/2019   Procedure: TANDEM RING INSERTION;  Surgeon: Gery Pray, MD;  Location[facility];  Service: Urology;  Laterality: N/A;   TANDEM RING INSERTION N/A 05/29/2019   Procedure: TANDEM RING INSERTION;  Surgeon: Gery Pray, MD;  Location: [facility];  Service: Urology;  Laterality: N/A;   TANDEM RING INSERTION N/A 06/05/2019   Procedure: TANDEM RING INSERTION;  Surgeon: Gery Pray, MD;  Location: [facility];  Service: Urology;  Laterality: N/A;    There were no vitals filed for this visit.   Subjective Assessment -  01/17/21 1210     Subjective Going great!    Currently in Pain? No/denies            Treatment: Patient seen for aquatic therapy today.  Treatment took place in water 2.5-4 feet deep depending upon activity.  Pt entered the pool via stairs with mild use of hand rails. Water temp 94 degrees F. Pt requires buoyancy of water for support and to offload joints with strengthening exercises.  Pt utilizes viscosity of the water required for strengthening.   75% depth: Water walking 10x each direction with noodle push pull or mitten hands arm abd/add. Tandem stance with double blue UE wts 20x on each side. No LOB, high knee marching across pool 6x with small noodle. Hip circumduction 10x each direction Bil. Underwater bicycle 4 min x 4 bouts. 1 min rest in between. Pt was educated in pelvic floor quick flicks.                               PT Short Term Goals - 12/05/20 1352       PT SHORT TERM GOAL #1   Title independent with initial HEP    Time 4    Period Weeks    Status Achieved      PT SHORT TERM GOAL #2   Title educated on vaginal moisturizers to improve tissue integrity    Time 4    Period Weeks    Status Achieved               PT Long Term Goals - 01/17/21 1528       PT LONG TERM GOAL #1   Title independent with advanced exercises for strength, flexibility, and endurance    Time 12    Period Weeks    Status On-going      PT LONG TERM GOAL #2   Title urinary leakage decreased >/= 75% due to improved pelvic floor strength >/= 3/5 and not holding her breath    Time 12    Period Weeks    Status Achieved   75%     PT LONG TERM GOAL #3   Title sit to stand </= 11.5 seconds so she feels steadier on her feet    Baseline able to do in 11 sec    Time 12    Period Weeks    Status Achieved      PT LONG TERM GOAL #4   Title ambulate with increased hip extension increased >/= 5 degrees to reduce risk of falls    Baseline hip extension -20  degrees bilaterally    Time 12    Period Weeks    Status Achieved  Plan - 01/17/21 1523     Clinical Impression Statement Pt reports her A1C has lowered to 5.1. Pt reports her leakage is at least 75% improved. She notices mainly sneezing and coughing bring on some leaking. PTA educated pt in how to perform quick flicks with her pelvic floor muscles vs longer holds. Pt has a meeting with trainer at her [employer] on Monday to learn the leg press machine, the seated row and seated chest press. Pt is interested in begining gentle land based resistive exercises to further her weight loss and get rid of her diabetes. Pt wants to come to 1 more aquatic session next Wednesday if there is an opening. If no one cancels pt agrees to DC to her [employer] combination of water and land based exercise. Pt increased her water bicycle to 4 min 4 bouts with no fatigue.    Personal Factors and Comorbidities Fitness;Comorbidity 3+;Time since onset of injury/illness/exacerbation    Comorbidities cervical cancer with radiation on 03/23/2019-05/04/2019 and 05/09/2019-06/15/2019 ; diabetes; c-section 11/10/2004    Examination-Activity Limitations Locomotion Level;Sleep;Continence;Toileting;Bend    Stability/Clinical Decision Making Evolving/Moderate complexity    Rehab Potential Good    PT Frequency 2x / week    PT Duration 12 weeks    PT Treatment/Interventions ADLs/Self Care Home Management;Biofeedback;Therapeutic activities;Therapeutic exercise;Balance training;Neuromuscular re-education;Patient/family education;Manual techniques;Passive range of motion;Dry needling;Spinal Manipulations;Joint Manipulations;Aquatic Therapy    PT Next Visit Plan If cancel pt will come 1x more to aquatic, if no cancel pt is ok with DC.    PT Home Exercise Plan Access Code: [email or web address]    Consulted and Agree with Plan of Care Patient             Patient will benefit from skilled therapeutic intervention in order to improve  the following deficits and impairments:  Abnormal gait, Decreased coordination, Decreased range of motion, Difficulty walking, Increased fascial restricitons, Increased muscle spasms, Pain, Decreased strength, Decreased mobility, Decreased balance, Impaired flexibility  Visit Diagnosis: Other urinary incontinence  Other abnormalities of gait and mobility  Cramp and spasm  Muscle weakness (generalized)     Problem List Patient Active Problem List   Diagnosis Date Noted   Type 2 diabetes mellitus without complication, without long-term current use of insulin ([place]) 11/15/2020   Incontinence of urine in female 09/09/2020   Onychomycosis of toenail 07/31/2020   OSA (obstructive sleep apnea) 07/31/2020   Prediabetes 05/27/2020   Sinus congestion 04/16/2020   Port-A-Cath in place 01/22/2020   Other hyperlipidemia 01/10/2020   Vitamin D deficiency 01/10/2020   Insulin resistance 01/10/2020   Class 3 severe obesity with serious comorbidity and body mass index (BMI) of 40.0 to 44.9 in adult ([place]) 01/10/2020   Obesity, Class II, BMI 35-39.9 09/07/2019   Dehydration 05/30/2019   Peripheral neuropathy due to chemotherapy ([place]) 04/25/2019   Nausea without vomiting 04/18/2019   Diarrhea 04/04/2019   CKD (chronic kidney disease), symptom management only, stage 3 (moderate) ([place]) 03/30/2019   Generalized anxiety disorder 03/30/2019   Hypertension associated with type 2 diabetes mellitus ([place])    Anxiety 03/23/2019   Squamous cell carcinoma of cervix ([place]) 03/23/2019   Deficiency anemia 03/22/2019   Squamous cell carcinoma in situ (SCCIS) of cervix 03/22/2019    Desmond Tufano, PTA 01/17/2021, 3:30 PM  [place] @ [place], [place], [address] Phone: [phone]   Fax:  [phone]  Name: CINTYA DAUGHETY MRN: [id number] Date of Birth: [birth year]

## 2021-01-28 ENCOUNTER — Ambulatory Visit (INDEPENDENT_AMBULATORY_CARE_PROVIDER_SITE_OTHER): Payer: Medicaid Other | Admitting: Primary Care

## 2021-01-28 ENCOUNTER — Encounter (INDEPENDENT_AMBULATORY_CARE_PROVIDER_SITE_OTHER): Payer: Self-pay | Admitting: Primary Care

## 2021-01-28 ENCOUNTER — Other Ambulatory Visit: Payer: Self-pay

## 2021-01-28 VITALS — BP 137/85 | HR 90 | Temp 97.3°F | Ht 64.0 in | Wt 223.2 lb

## 2021-01-28 DIAGNOSIS — I10 Essential (primary) hypertension: Secondary | ICD-10-CM | POA: Diagnosis not present

## 2021-01-28 DIAGNOSIS — Z760 Encounter for issue of repeat prescription: Secondary | ICD-10-CM | POA: Diagnosis not present

## 2021-01-28 MED ORDER — AMLODIPINE BESYLATE-VALSARTAN 10-320 MG PO TABS: 1.0000 | ORAL_TABLET | Freq: Every day | ORAL | 1 refills | Status: DC

## 2021-01-28 NOTE — Progress Notes (Signed)
Tamara Osborne is a 45 y.o. female presents for hypertension evaluation, Denies shortness of breath, headaches, chest pain or lower extremity edema, sudden onset, vision changes, unilateral weakness, dizziness, paresthesias   Patient reports adherence with medications.  Dietary habits include: monitor sodium and carbs  Exercise habits include:yes  Family / Social history: Father DM, Sister MI    Past Medical History:  Diagnosis Date   Anemia    Anxiety    Bartholin cyst    Cervical cancer ([place])    Chronic kidney disease    Constipation    Depression    Deviated septum    Diabetes mellitus without complication ([place])    Difficult intravenous access    used port for 05-09-2019 surgery   Fibroids    History of blood transfusion    2 units given 04-26-2019, has had total of 8 units since jan 2021   History of blood transfusion 1 unit given 05-30-19   iv fluids also given   History of kidney problems    History of radiation therapy 03/23/2019-05/04/2019   Cervical external beam   Dr Gery Pray   History of radiation therapy 05/09/2019-06/05/2019   vaginal brachytherapy   Dr Gery Pray   History of recent blood transfusion 05/16/2019   2 units given  per dr Pollyann Savoy at cancer center   Hypertension    Neuropathy    both thumbs   Obesity    Palpitations    PONV (postoperative nausea and vomiting)    Prediabetes    Preeclampsia 2006   Sleep apnea    no cpap used insurance would not cover, osa severe per pt   SOB (shortness of breath)    Swallowing difficulty    Past Surgical History:  Procedure Laterality Date   CESAREAN SECTION WITH BILATERAL TUBAL LIGATION  11/10/2004       DILATION AND CURETTAGE OF UTERUS  1997   IR IMAGING GUIDED PORT INSERTION  04/04/2019   OPERATIVE ULTRASOUND N/A 05/09/2019   Procedure: OPERATIVE ULTRASOUND;  Surgeon: Gery Pray, MD;  Location: [place];  Service: Urology;  Laterality: N/A;    OPERATIVE ULTRASOUND N/A 05/15/2019   Procedure: OPERATIVE ULTRASOUND;  Surgeon: Gery Pray, MD;  Location: [facility];  Service: Urology;  Laterality: N/A;   OPERATIVE ULTRASOUND N/A 05/22/2019   Procedure: OPERATIVE ULTRASOUND;  Surgeon: Gery Pray, MD;  Location: [facility];  Service: Urology;  Laterality: N/A;   OPERATIVE ULTRASOUND N/A 05/29/2019   Procedure: OPERATIVE ULTRASOUND;  Surgeon: Gery Pray, MD;  Location: [facility];  Service: Urology;  Laterality: N/A;   OPERATIVE ULTRASOUND N/A 06/05/2019   Procedure: OPERATIVE ULTRASOUND;  Surgeon: Gery Pray, MD;  Location: [facility];  Service: Urology;  Laterality: N/A;   TANDEM RING INSERTION N/A 05/09/2019   Procedure: TANDEM RING INSERTION;  Surgeon: Gery Pray, MD;  Location: [facility];  Service: Urology;  Laterality: N/A;   TANDEM RING INSERTION N/A 05/15/2019   Procedure: TANDEM RING INSERTION;  Surgeon: Gery Pray, MD;  Location: [facility];  Service: Urology;  Laterality: N/A;   TANDEM RING INSERTION N/A 05/22/2019   Procedure: TANDEM RING INSERTION;  Surgeon: Gery Pray, MD;  Location: [facility];  Service: Urology;  Laterality: N/A;   TANDEM RING INSERTION N/A 05/29/2019   Procedure: TANDEM RING INSERTION;  Surgeon: Gery Pray, MD;  Location: [facility];  Service: Urology;  Laterality: N/A;   TANDEM RING INSERTION N/A 06/05/2019   Procedure: TANDEM RING INSERTION;  Surgeon: Gery Pray, MD;  Location: [facility];  Service: Urology;  Laterality: N/A;   Allergies  Allergen Reactions   Penicillins     Not sure a child allergy   Shellfish Allergy Swelling    Seafood also any kind   Sulfa Antibiotics     Not sure a child allergy   Current Outpatient Medications on File Prior to Visit  Medication Sig Dispense Refill   Accu-Chek Softclix Lancets lancets Use to check blood  sugar TID. 100 each 2   ASHWAGANDHA PO Take by mouth at bedtime. Blue [place] Gummies     Blood Glucose Monitoring Suppl (ACCU-CHEK GUIDE) w/Device KIT Use to check blood sugar TID. 1 kit 0   chlorhexidine (PERIDEX) 0.12 % solution SMARTSIG:By Mouth     Cholecalciferol (VITAMIN D3) 50 MCG (2000 UT) TABS Take 2,000 Units by mouth daily.     CLONIDINE HCL PO Take 0.1 mg by mouth in the morning and at bedtime. Pending BP readings     fluticasone (FLONASE) 50 MCG/ACT nasal spray Place 1 spray into both nostrils daily as needed for allergies or rhinitis.     glucose blood (ACCU-CHEK GUIDE) test strip Use to check blood sugar TID. 100 each 2   Insulin Pen Needle (BD PEN NEEDLE NANO 2ND GEN) 32G X 4 MM MISC 1 Package by Does not apply route 2 (two) times daily. 100 each 0   lidocaine-prilocaine (EMLA) cream Apply 1 application topically as needed. Apply to port access daily as needed 30 g 3   liraglutide (VICTOZA) 18 MG/3ML SOPN Inject 1.8 mg into the skin daily. 9 mL 0   metFORMIN (GLUCOPHAGE-XR) 500 MG 24 hr tablet Take 1 tablet (500 mg total) by mouth daily with breakfast. 90 tablet 1   Misc. Devices MISC CMP with GFR 1 ampule 0   mupirocin ointment (BACTROBAN) 2 % 1 application 3 (three) times daily.     No current facility-administered medications on file prior to visit.   Social History   Socioeconomic History   Marital status: Single    Spouse name: Not on file   Number of children: 4   Years of education: Not on file   Highest education level: Not on file  Occupational History   Not on file  Tobacco Use   Smoking status: Never   Smokeless tobacco: Never  Vaping Use   Vaping Use: Never used  Substance and Sexual Activity   Alcohol use: No   Drug use: No   Sexual activity: Not Currently    Birth control/protection: Surgical  Other Topics Concern   Not on file  Social History Narrative   Lives with her boyfriend   Social Determinants of Health   Financial Resource Strain: Not  on file  Food Insecurity: Not on file  Transportation Needs: Not on file  Physical Activity: Not on file  Stress: Not on file  Social Connections: Not on file  Intimate Partner Violence: Not on file   Family History  Problem Relation Age of Onset   Brain cancer Mother        lung cancer to brain   Hypertension Mother    Cancer Mother    Depression Mother    Anxiety disorder Mother    Diabetes Father    Kidney disease Father    Cancer Father        kidney cancer  Heart failure Sister      OBJECTIVE:  Vitals:   01/28/21 1103  BP: 137/85  Pulse: 90  Temp: (!) 97.3 F (36.3 C)  TempSrc: Temporal  SpO2: 96%  Weight: 223 lb 3.2 oz (101.2 kg)  Height: 5' 4"  (1.626 m)   Physical exam: General: Vital signs reviewed.  Patient is well-developed and well-nourished, morbid obesity in no acute distress and cooperative with exam. Head: Normocephalic and atraumatic. Eyes: EOMI, conjunctivae normal, no scleral icterus. Neck: Supple, trachea midline, normal ROM, no JVD, masses, thyromegaly, or carotid bruit present. Cardiovascular: RRR, S1 normal, S2 normal, no murmurs, gallops, or rubs. Pulmonary/Chest: Clear to auscultation bilaterally, no wheezes, rales, or rhonchi. Abdominal: Soft, non-tender, non-distended, BS +, no masses, organomegaly, or guarding present. Musculoskeletal: No joint deformities, erythema, or stiffness, ROM full and nontender. Extremities: No lower extremity edema bilaterally,  pulses symmetric and intact bilaterally. No cyanosis or clubbing. Neurological: A&O x3, Strength is normal Skin: Warm, dry and intact. No rashes or erythema. Psychiatric: Normal mood and affect. speech and behavior is normal. Cognition and memory are normal.     ROS Comprehensive ROS pertinent positive and negative noted in HPI Last 3 Office BP readings: BP Readings from Last 3 Encounters:  01/28/21 137/85  12/17/20 135/81  12/05/20 138/82    BMET    Component Value Date/Time    NA 137 12/12/2020 1108   NA 140 09/20/2019 1106   K 4.1 12/12/2020 1108   CL 105 12/12/2020 1108   CO2 25 12/12/2020 1108   GLUCOSE 97 12/12/2020 1108   BUN 34 (H) 12/12/2020 1108   BUN 20 09/20/2019 1106   CREATININE 1.44 (H) 12/12/2020 1108   CALCIUM 9.3 12/12/2020 1108   GFRNONAA 46 (L) 12/12/2020 1108   GFRAA 55 (L) 09/20/2019 1106    Renal function: CrCl cannot be calculated (Patient's most recent lab result is older than the maximum 21 days allowed.).  Clinical ASCVD: Yes  The 10-year ASCVD risk score (Arnett DK, et al., 2019) is: 6.1%   Values used to calculate the score:     Age: 49 years     Sex: Female     Is Non-Hispanic African American: No     Diabetic: Yes     Tobacco smoker: No     Systolic Blood Pressure: 818 mmHg     Is BP treated: Yes     HDL Cholesterol: 32 mg/dL     Total Cholesterol: 214 mg/dL  ASCVD risk factors include- [place]   ASSESSMENT & PLAN:  1. Essential hypertension     Meds ordered this encounter  Medications   amLODipine-valsartan (EXFORGE) 10-320 MG tablet    Sig: Take 1 tablet by mouth daily.    Dispense:  90 tablet    Refill:  1   Tamara Osborne was seen today for hypertension.  Diagnoses and all orders for this visit:  Essential hypertension -Counseled on lifestyle modifications for blood pressure control including reduced dietary sodium, increased exercise, weight reduction and adequate sleep. Also, educated patient about the risk for cardiovascular events, stroke and heart attack. Also counseled patient about the importance of medication adherence. If you participate in smoking, it is important to stop using tobacco as this will increase the risks associated with uncontrolled blood pressure.   -Hypertension longstanding diagnosed currently Exforge 10/320  on current medications. Patient is adherent with current medications.   Goal BP:  For patients younger than 60: Goal BP < 130/80. For patients 60 and older: Goal BP <  140/90. For patients with diabetes: Goal BP < 130/80. Your most recent BP: 137/85  Minimize salt intake. Minimize alcohol intake   Medication refill     amLODipine-valsartan (EXFORGE) 10-320 MG tablet; Take 1 tablet by mouth daily.    This note has been created with Surveyor, quantity. Any transcriptional errors are unintentional.   Kerin Perna, NP 01/28/2021, 11:15 AM

## 2021-02-06 ENCOUNTER — Other Ambulatory Visit: Payer: Self-pay

## 2021-02-06 ENCOUNTER — Inpatient Hospital Stay: Payer: Medicaid Other | Attending: Gynecologic Oncology

## 2021-02-06 DIAGNOSIS — Z452 Encounter for adjustment and management of vascular access device: Secondary | ICD-10-CM | POA: Insufficient documentation

## 2021-02-06 DIAGNOSIS — C539 Malignant neoplasm of cervix uteri, unspecified: Secondary | ICD-10-CM | POA: Diagnosis present

## 2021-02-06 DIAGNOSIS — Z95828 Presence of other vascular implants and grafts: Secondary | ICD-10-CM

## 2021-02-06 MED ORDER — HEPARIN SOD (PORK) LOCK FLUSH 100 UNIT/ML IV SOLN
500.0000 [IU] | Freq: Once | INTRAVENOUS | Status: AC
  Administered 2021-02-06: 500 [IU]

## 2021-02-06 MED ORDER — SODIUM CHLORIDE 0.9% FLUSH
10.0000 mL | Freq: Once | INTRAVENOUS | Status: AC
  Administered 2021-02-06: 10 mL

## 2021-02-11 ENCOUNTER — Telehealth: Payer: Self-pay | Admitting: Oncology

## 2021-02-11 NOTE — Telephone Encounter (Signed)
Shantea called and requested an appointment with Dr. Berline Lopes.  Appointment scheduled for 03/10/20.  Akisha verbalized understanding and agreement.

## 2021-02-13 ENCOUNTER — Ambulatory Visit (INDEPENDENT_AMBULATORY_CARE_PROVIDER_SITE_OTHER): Payer: Medicaid Other | Admitting: Family Medicine

## 2021-02-13 ENCOUNTER — Other Ambulatory Visit: Payer: Self-pay

## 2021-02-13 ENCOUNTER — Encounter (INDEPENDENT_AMBULATORY_CARE_PROVIDER_SITE_OTHER): Payer: Self-pay | Admitting: Family Medicine

## 2021-02-13 VITALS — BP 136/86 | HR 85 | Temp 97.9°F | Ht 62.0 in | Wt 218.0 lb

## 2021-02-13 DIAGNOSIS — Z6839 Body mass index (BMI) 39.0-39.9, adult: Secondary | ICD-10-CM | POA: Diagnosis not present

## 2021-02-13 DIAGNOSIS — E119 Type 2 diabetes mellitus without complications: Secondary | ICD-10-CM | POA: Diagnosis not present

## 2021-02-13 DIAGNOSIS — R7401 Elevation of levels of liver transaminase levels: Secondary | ICD-10-CM | POA: Diagnosis not present

## 2021-02-13 MED ORDER — OZEMPIC (0.25 OR 0.5 MG/DOSE) 2 MG/1.5ML ~~LOC~~ SOPN
0.2500 mg | PEN_INJECTOR | SUBCUTANEOUS | 0 refills | Status: DC
Start: 2021-02-13 — End: 2021-03-13

## 2021-02-13 NOTE — Progress Notes (Signed)
Chief Complaint:   OBESITY Tamara Osborne is here to discuss her progress with her obesity treatment plan along with follow-up of her obesity related diagnoses. Tamara Osborne is on the Category 2 Plan and states she is following her eating plan approximately 85% of the time. Tamara Osborne states she is doing water aerobics 45 minutes 2 times per week.  Today's visit was #: 25 Starting weight: 208 lbs Starting date: 09/20/2019 Today's weight: 218 lbs Today's date: 02/13/2021 Total lbs lost to date: 0 Total lbs lost since last in-office visit: 0  Interim History: Pt voices some confusion with diabetes diagnosis. She thinks she may have fallen off the meal plan during Thanksgiving. Pt realizes she may have overlooked carb content of foods in the past. She recognizes she wants to continue to be mindful of food options.  Subjective:   1. Type 2 diabetes mellitus without complication, without long-term current use of insulin (HCC) Tamara Osborne is on GLP-1. Her last A1c was 5.2 (POC). She reports abdominal tenderness from repeating injection. Pt went to see a diabetes educator.  2. Transaminitis No ultrasound of liver on file. Pt's AST is 49, ALT 70, and alkaline phosphate 92. She is not on any liver altering medications.  Assessment/Plan:   1. Type 2 diabetes mellitus without complication, without long-term current use of insulin (HCC) Good blood sugar control is important to decrease the likelihood of diabetic complications such as nephropathy, neuropathy, limb loss, blindness, coronary artery disease, and death. Intensive lifestyle modification including diet, exercise and weight loss are the first line of treatment for diabetes. Tamara Osborne will stop Victoza and start Ozempic 0.25 mg as prescribed.  Start- Semaglutide,0.25 or 0.5MG /DOS, (OZEMPIC, 0.25 OR 0.5 MG/DOSE,) 2 MG/1.5ML SOPN; Inject 0.25 mg into the skin once a week.  Dispense: 1.5 mL; Refill: 0  2. Transaminitis Repeat labs in February  2023.  3. Obesity with current BMI of 40.0  Tamara Osborne is currently in the action stage of change. As such, her goal is to continue with weight loss efforts. She has agreed to the Category 2 Plan.   Exercise goals: All adults should avoid inactivity. Some physical activity is better than none, and adults who participate in any amount of physical activity gain some health benefits.  Behavioral modification strategies: increasing lean protein intake, meal planning and cooking strategies, keeping healthy foods in the home, holiday eating strategies , and planning for success.  Tamara Osborne has agreed to follow-up with our clinic in 3-4 weeks. She was informed of the importance of frequent follow-up visits to maximize her success with intensive lifestyle modifications for her multiple health conditions.   Objective:   Blood pressure 136/86, pulse 85, temperature 97.9 F (36.6 C), height 5\' 2"  (1.575 m), weight 218 lb (98.9 kg), SpO2 98 %. Body mass index is 39.87 kg/m.  General: Cooperative, alert, well developed, in no acute distress. HEENT: Conjunctivae and lids unremarkable. Cardiovascular: Regular rhythm.  Lungs: Normal work of breathing. Neurologic: No focal deficits.   Lab Results  Component Value Date   CREATININE 1.44 (H) 12/12/2020   BUN 34 (H) 12/12/2020   NA 137 12/12/2020   K 4.1 12/12/2020   CL 105 12/12/2020   CO2 25 12/12/2020   Lab Results  Component Value Date   ALT 70 (H) 12/12/2020   AST 49 (H) 12/12/2020   ALKPHOS 92 12/12/2020   BILITOT 1.0 12/12/2020   Lab Results  Component Value Date   HGBA1C 5.2 01/13/2021   HGBA1C 7.5 (H) 10/18/2020  HGBA1C 6.0 (H) 05/20/2020   HGBA1C 5.2 10/30/2019   HGBA1C CANCELED 09/20/2019   Lab Results  Component Value Date   INSULIN 40.3 (H) 09/20/2019   Lab Results  Component Value Date   TSH 1.910 09/20/2019   Lab Results  Component Value Date   CHOL 214 (H) 05/20/2020   HDL 32 (L) 05/20/2020   LDLCALC 145 (H)  05/20/2020   TRIG 184 (H) 05/20/2020   CHOLHDL 6.7 05/20/2020   Lab Results  Component Value Date   VD25OH 59.37 10/18/2020   VD25OH 45.60 05/20/2020   VD25OH 69.37 01/22/2020   Lab Results  Component Value Date   WBC 7.6 12/12/2020   HGB 13.1 12/12/2020   HCT 37.9 12/12/2020   MCV 80.6 12/12/2020   PLT 304 12/12/2020   Lab Results  Component Value Date   IRON 17 (L) 03/27/2019   TIBC 283 03/27/2019   FERRITIN 32 03/27/2019    Attestation Statements:   Reviewed by clinician on day of visit: allergies, medications, problem list, medical history, surgical history, family history, social history, and previous encounter notes.  Coral Ceo, CMA, am acting as transcriptionist for Coralie Common, MD.   I have reviewed the above documentation for accuracy and completeness, and I agree with the above. - Coralie Common, MD

## 2021-03-10 ENCOUNTER — Encounter: Payer: Self-pay | Admitting: Gynecologic Oncology

## 2021-03-10 ENCOUNTER — Other Ambulatory Visit: Payer: Self-pay

## 2021-03-10 ENCOUNTER — Inpatient Hospital Stay: Payer: Medicaid Other | Attending: Gynecologic Oncology | Admitting: Gynecologic Oncology

## 2021-03-10 VITALS — BP 146/78 | HR 103 | Temp 99.0°F | Resp 18 | Ht 62.0 in | Wt 224.1 lb

## 2021-03-10 DIAGNOSIS — C539 Malignant neoplasm of cervix uteri, unspecified: Secondary | ICD-10-CM

## 2021-03-10 DIAGNOSIS — E669 Obesity, unspecified: Secondary | ICD-10-CM | POA: Diagnosis not present

## 2021-03-10 DIAGNOSIS — Z9221 Personal history of antineoplastic chemotherapy: Secondary | ICD-10-CM | POA: Insufficient documentation

## 2021-03-10 DIAGNOSIS — Z79899 Other long term (current) drug therapy: Secondary | ICD-10-CM | POA: Insufficient documentation

## 2021-03-10 DIAGNOSIS — Z794 Long term (current) use of insulin: Secondary | ICD-10-CM | POA: Diagnosis not present

## 2021-03-10 DIAGNOSIS — Z923 Personal history of irradiation: Secondary | ICD-10-CM | POA: Insufficient documentation

## 2021-03-10 DIAGNOSIS — E119 Type 2 diabetes mellitus without complications: Secondary | ICD-10-CM | POA: Insufficient documentation

## 2021-03-10 DIAGNOSIS — N39498 Other specified urinary incontinence: Secondary | ICD-10-CM

## 2021-03-10 DIAGNOSIS — Z6841 Body Mass Index (BMI) 40.0 and over, adult: Secondary | ICD-10-CM | POA: Diagnosis not present

## 2021-03-10 DIAGNOSIS — Z8541 Personal history of malignant neoplasm of cervix uteri: Secondary | ICD-10-CM | POA: Diagnosis present

## 2021-03-10 DIAGNOSIS — I129 Hypertensive chronic kidney disease with stage 1 through stage 4 chronic kidney disease, or unspecified chronic kidney disease: Secondary | ICD-10-CM | POA: Diagnosis not present

## 2021-03-10 DIAGNOSIS — N952 Postmenopausal atrophic vaginitis: Secondary | ICD-10-CM | POA: Insufficient documentation

## 2021-03-10 DIAGNOSIS — G629 Polyneuropathy, unspecified: Secondary | ICD-10-CM | POA: Diagnosis not present

## 2021-03-10 NOTE — Progress Notes (Signed)
Gynecologic Oncology Return Clinic Visit  03/10/2021  Reason for Visit: Surveillance visit in the setting of history of cervix cancer  Treatment History: Oncology History  Squamous cell carcinoma of cervix ([place])  03/21/2019 Pathology Results   The biopsy is superficial and therefore depth of invasion cannot be  determined.  There is at least carcinoma in-situ.    03/23/2019 Initial Diagnosis   Squamous cell carcinoma of cervix ([place])   03/23/2019 Pathology Results   CERVIX, 11 O CLOCK, BIOPSY:  -  Squamous cell carcinoma, invasive  -  See comment   03/23/2019 - 06/05/2019 Radiation Therapy   External beam therapy was from 03/23/2019 - 05/04/2019. HDR vaginal brachytherapy was from 05/19/2019 - 06/05/2019.   Site Technique Total Dose (Gy) Dose per Fx (Gy) Completed Fx Beam Energies  Cervix: Cervix HDR-brachy 5.5/5.5 5.5 1/1 Ir-192  Cervix: Cervix HDR-brachy 5.5/5.5 5.5 1/1 Ir-192  Cervix: Cervix_Bst HDR-brachy 5.5/5.5 5.5 1/1 Ir-192  Cervix: Cervix HDR-brachy 5.5/5.5 5.5 1/1 Ir-192  Cervix: Cervix 3D 45/45 1.8 25/25 10X, 15X  Cervix: Cervix HDR-brachy 5.5/5.5 5.5 1/1 Ir-192  Cervix: Cervix_Bst 3D 9/9 1.8 5/5 15X      03/28/2019 PET scan   1. Hypermetabolic cervical mass measures roughly 7.6 by 6.9 by 4.9 cm, compatible with malignancy. No adenopathy or distant metastatic spread identified. 2. Extending cephalad and anterior to the uterine fundus, there is a 450 cubic cm simple fluid density structure without hypermetabolic activity. This may represent a right adnexal cyst (favored), peritoneal inclusion cyst, or (if the patient has a remote history of pancreatitis) a remote pseudocyst. 3. Faint ground density nodularity in both lower lobes which could be from atypical infection or extrinsic allergic alveolitis. Given nodular appearance of some of this ground-glass density, I would recommend follow up chest CT in 3 months time in order to ensure clearance. 4. Moderate cardiomegaly, cause  uncertain. The patient also has advanced for age/gender atherosclerosis. Aortic Atherosclerosis (ICD10-I70.0).     04/04/2019 Procedure   Successful placement of a right internal jugular approach power injectable Port-A-Cath. The catheter is ready for immediate use   04/07/2019 - 05/12/2019 Chemotherapy   The patient had weekly cisplatin for chemotherapy treatment.     09/06/2019 PET scan   1. No evidence residual hypermetabolic cervical tissue. 2. No evidence of metastatic cervical carcinoma. 3. Large benign cystic mass unchanged in the upper pelvis.        Pap 08/2020: ASCUS, HPV negative  Interval History: Saw Dr. Sondra Come on 10/6, was NED at that time.  She's been doing pelvic PT. found physical therapy very helpful.  She had 2 rounds of physical therapy, the first she felt helped significantly with her bladder symptoms.  More recently, she did some work in the pool which helped with her neuropathy.  She notes that balance is much better, denies any falls since PT.  She is going to the [employer] twice a week.  She continues to use her vaginal dilator and has very little spotting when she does.  She is sexually active about once a month on average.  She still has some pinching sensation with sexual activity, seems to be worse if she has not been using coconut oil vaginally.  Bowel function is significantly better.  She was transition to a weekly diabetes injection and now has 1 or 2 bowel movements a day, usually soft but not loose.  She denies any urinary incontinence during the day.  Has had 1 or 2 accidents since finishing pelvic  floor physical therapy at night when she has had her CPAP on.  Past Medical/Surgical History: Past Medical History:  Diagnosis Date   Anemia    Anxiety    Bartholin cyst    Cervical cancer ([facility])    Chronic kidney disease    Constipation    Depression    Deviated septum    Diabetes mellitus without complication ([place])    Difficult intravenous access    used  port for 05-09-2019 surgery   Fibroids    History of blood transfusion    2 units given 04-26-2019, has had total of 8 units since jan 2021   History of blood transfusion 1 unit given 05-30-19   iv fluids also given   History of kidney problems    History of radiation therapy 03/23/2019-05/04/2019   Cervical external beam   Dr Gery Pray   History of radiation therapy 05/09/2019-06/05/2019   vaginal brachytherapy   Dr Gery Pray   History of recent blood transfusion 05/16/2019   2 units given  per dr Pollyann Savoy at cancer center   Hypertension    Neuropathy    both thumbs   Obesity    Palpitations    PONV (postoperative nausea and vomiting)    Prediabetes    Preeclampsia 2006   Sleep apnea    no cpap used insurance would not cover, osa severe per pt   SOB (shortness of breath)    Swallowing difficulty     Past Surgical History:  Procedure Laterality Date   CESAREAN SECTION WITH BILATERAL TUBAL LIGATION  11/10/2004       DILATION AND CURETTAGE OF UTERUS  1997   IR IMAGING GUIDED PORT INSERTION  04/04/2019   OPERATIVE ULTRASOUND N/A 05/09/2019   Procedure: OPERATIVE ULTRASOUND;  Surgeon: Gery Pray, MD;  Location: [place];  Service: Urology;  Laterality: N/A;   OPERATIVE ULTRASOUND N/A 05/15/2019   Procedure: OPERATIVE ULTRASOUND;  Surgeon: Gery Pray, MD;  Location: [facility];  Service: Urology;  Laterality: N/A;   OPERATIVE ULTRASOUND N/A 05/22/2019   Procedure: OPERATIVE ULTRASOUND;  Surgeon: Gery Pray, MD;  Location: [facility];  Service: Urology;  Laterality: N/A;   OPERATIVE ULTRASOUND N/A 05/29/2019   Procedure: OPERATIVE ULTRASOUND;  Surgeon: Gery Pray, MD;  Location: [facility];  Service: Urology;  Laterality: N/A;   OPERATIVE ULTRASOUND N/A 06/05/2019   Procedure: OPERATIVE ULTRASOUND;  Surgeon: Gery Pray, MD;  Location: [facility];  Service: Urology;  Laterality: N/A;   TANDEM RING  INSERTION N/A 05/09/2019   Procedure: TANDEM RING INSERTION;  Surgeon: Gery Pray, MD;  Location: [facility];  Service: Urology;  Laterality: N/A;   TANDEM RING INSERTION N/A 05/15/2019   Procedure: TANDEM RING INSERTION;  Surgeon: Gery Pray, MD;  Location: [facility];  Service: Urology;  Laterality: N/A;   TANDEM RING INSERTION N/A 05/22/2019   Procedure: TANDEM RING INSERTION;  Surgeon: Gery Pray, MD;  Location: [facility];  Service: Urology;  Laterality: N/A;   TANDEM RING INSERTION N/A 05/29/2019   Procedure: TANDEM RING INSERTION;  Surgeon: Gery Pray, MD;  Location: [facility];  Service: Urology;  Laterality: N/A;   TANDEM RING INSERTION N/A 06/05/2019   Procedure: TANDEM RING INSERTION;  Surgeon: Gery Pray, MD;  Location: [facility];  Service: Urology;  Laterality: N/A;    Family History  Problem Relation Age of Onset   Brain cancer Mother  lung cancer to brain   Hypertension Mother    Cancer Mother    Depression Mother    Anxiety disorder Mother    Diabetes Father    Kidney disease Father    Cancer Father        kidney cancer   Heart failure Sister     Social History   Socioeconomic History   Marital status: Single    Spouse name: Not on file   Number of children: 4   Years of education: Not on file   Highest education level: Not on file  Occupational History   Not on file  Tobacco Use   Smoking status: Never   Smokeless tobacco: Never  Vaping Use   Vaping Use: Never used  Substance and Sexual Activity   Alcohol use: No   Drug use: No   Sexual activity: Not Currently    Birth control/protection: Surgical  Other Topics Concern   Not on file  Social History Narrative   Lives with her boyfriend   Social Determinants of Health   Financial Resource Strain: Not on file  Food Insecurity: Not on file  Transportation Needs: Not on file  Physical Activity: Not on  file  Stress: Not on file  Social Connections: Not on file    Current Medications:  Current Outpatient Medications:    Accu-Chek Softclix Lancets lancets, Use to check blood sugar TID., Disp: 100 each, Rfl: 2   amLODipine-valsartan (EXFORGE) 10-320 MG tablet, Take 1 tablet by mouth daily., Disp: 90 tablet, Rfl: 1   Blood Glucose Monitoring Suppl (ACCU-CHEK GUIDE) w/Device KIT, Use to check blood sugar TID., Disp: 1 kit, Rfl: 0   chlorhexidine (PERIDEX) 0.12 % solution, SMARTSIG:By Mouth, Disp: , Rfl:    Cholecalciferol (VITAMIN D3) 50 MCG (2000 UT) TABS, Take 2,000 Units by mouth daily., Disp: , Rfl:    CLONIDINE HCL PO, Take 0.1 mg by mouth in the morning and at bedtime. Pending BP readings, Disp: , Rfl:    fluticasone (FLONASE) 50 MCG/ACT nasal spray, Place 1 spray into both nostrils daily as needed for allergies or rhinitis., Disp: , Rfl:    glucose blood (ACCU-CHEK GUIDE) test strip, Use to check blood sugar TID., Disp: 100 each, Rfl: 2   Insulin Pen Needle (BD PEN NEEDLE NANO 2ND GEN) 32G X 4 MM MISC, 1 Package by Does not apply route 2 (two) times daily., Disp: 100 each, Rfl: 0   lidocaine-prilocaine (EMLA) cream, Apply 1 application topically as needed. Apply to port access daily as needed, Disp: 30 g, Rfl: 3   metFORMIN (GLUCOPHAGE-XR) 500 MG 24 hr tablet, Take 1 tablet (500 mg total) by mouth daily with breakfast., Disp: 90 tablet, Rfl: 1   Misc. Devices MISC, CMP with GFR, Disp: 1 ampule, Rfl: 0   mupirocin ointment (BACTROBAN) 2 %, 1 application 3 (three) times daily., Disp: , Rfl:    Semaglutide,0.25 or 0.5MG/DOS, (OZEMPIC, 0.25 OR 0.5 MG/DOSE,) 2 MG/1.5ML SOPN, Inject 0.25 mg into the skin once a week., Disp: 1.5 mL, Rfl: 0   ASHWAGANDHA PO, Take by mouth at bedtime. [place] Gummies (Patient not taking: Reported on 03/10/2021), Disp: , Rfl:   Review of Systems: + anxiety Denies appetite changes, fevers, chills, fatigue, unexplained weight changes. Denies hearing loss, neck  lumps or masses, mouth sores, ringing in ears or voice changes. Denies cough or wheezing.  Denies shortness of breath. Denies chest pain or palpitations. Denies leg swelling. Denies abdominal distention, pain, blood in stools, constipation,  diarrhea, nausea, vomiting, or early satiety. Denies pain with intercourse, dysuria, frequency, hematuria or incontinence. Denies hot flashes, pelvic pain, vaginal bleeding or vaginal discharge.   Denies joint pain, back pain or muscle pain/cramps. Denies itching, rash, or wounds. Denies dizziness, headaches, numbness or seizures. Denies swollen lymph nodes or glands, denies easy bruising or bleeding. Denies anxiety, depression, confusion, or decreased concentration.  Physical Exam: BP (!) 146/78 (BP Location: Left Arm)    Pulse (!) 103    Temp 99 F (37.2 C) (Tympanic)    Resp 18    Ht 5' 2"  (1.575 m)    Wt 224 lb 1.6 oz (101.7 kg)    SpO2 100%    BMI 40.99 kg/m  General: Alert, oriented, no acute distress. HEENT: Normocephalic, atraumatic, sclera anicteric. Chest: Clear to auscultation bilaterally.  No wheezes or rhonchi.  Port site clean.  Cardiovascular: Regular rate and rhythm, no murmurs. Abdomen: Obese, soft, nontender.  Normoactive bowel sounds.  No masses or hepatosplenomegaly appreciated.   Extremities: Grossly normal range of motion.  Warm, well perfused.  No edema bilaterally. Lymphatics: No cervical, supraclavicular, or inguinal adenopathy. GU: Normal appearing external genitalia without erythema, excoriation, or lesions.  Speculum exam reveals moderately atrophic vaginal mucosa, no lesions or masses, no bleeding or discharge.  No atypical vascularity.  Bimanual exam reveals no nodularity or masses. Rectovaginal exam confirms these findings.  Laboratory & Radiologic Studies: None new  Assessment & Plan: Tamara Osborne is a 46 y.o. woman with Stage IIB SCC who presents for surveillance.  Completed adjuvant chemoradiation in April  2021.  Patient remains NED.  She will be due for a Pap test in approximately 6 months.  She has had significant benefit in her symptoms after finishing pelvic floor physical therapy.  I encouraged her to continue using vaginal dilator as well as lubricants.  Her urinary symptoms have almost completely resolved after pelvic floor PT.  No referral for urology at this time.  Will continue with surveillance visits every 3 months.  She sees Dr. Sondra Come in April and will see me back in 6.  We reviewed signs and symptoms that would be concerning for disease recurrence and she knows to call if she develops any of these before her next scheduled visit.  32 minutes of total time was spent for this patient encounter, including preparation, face-to-face counseling with the patient and coordination of care, and documentation of the encounter.  Jeral Pinch, MD  Division of Gynecologic Oncology  Department of Obstetrics and Gynecology  [facility]

## 2021-03-10 NOTE — Patient Instructions (Signed)
It was good to see you today.  I do not see or feel any evidence of cancer recurrence on your exam.  I am really happy to hear that you have had so much benefit from pelvic floor physical therapy.  I will see you back for follow-up in 6 months.  If you develop any vaginal bleeding, pelvic pain, or other concerning symptoms before then, please call to see me sooner.  My schedule is not out past the end of June.  Please call back in May or early June to get a visit scheduled with me in mid July.

## 2021-03-13 ENCOUNTER — Ambulatory Visit (INDEPENDENT_AMBULATORY_CARE_PROVIDER_SITE_OTHER): Payer: Medicaid Other | Admitting: Family Medicine

## 2021-03-13 ENCOUNTER — Other Ambulatory Visit: Payer: Self-pay

## 2021-03-13 ENCOUNTER — Encounter (INDEPENDENT_AMBULATORY_CARE_PROVIDER_SITE_OTHER): Payer: Self-pay | Admitting: Family Medicine

## 2021-03-13 VITALS — BP 132/78 | HR 87 | Temp 97.8°F | Ht 62.0 in | Wt 221.0 lb

## 2021-03-13 DIAGNOSIS — I152 Hypertension secondary to endocrine disorders: Secondary | ICD-10-CM | POA: Diagnosis not present

## 2021-03-13 DIAGNOSIS — Z6841 Body Mass Index (BMI) 40.0 and over, adult: Secondary | ICD-10-CM | POA: Diagnosis not present

## 2021-03-13 DIAGNOSIS — E1159 Type 2 diabetes mellitus with other circulatory complications: Secondary | ICD-10-CM | POA: Diagnosis not present

## 2021-03-13 DIAGNOSIS — E119 Type 2 diabetes mellitus without complications: Secondary | ICD-10-CM

## 2021-03-13 DIAGNOSIS — E669 Obesity, unspecified: Secondary | ICD-10-CM | POA: Diagnosis not present

## 2021-03-13 MED ORDER — OZEMPIC (0.25 OR 0.5 MG/DOSE) 2 MG/1.5ML ~~LOC~~ SOPN: 0.5000 mg | PEN_INJECTOR | SUBCUTANEOUS | 0 refills | Status: DC

## 2021-03-17 DIAGNOSIS — Z0271 Encounter for disability determination: Secondary | ICD-10-CM

## 2021-03-17 NOTE — Progress Notes (Signed)
Chief Complaint:   OBESITY Tamara Osborne is here to discuss her progress with her obesity treatment plan along with follow-up of her obesity related diagnoses. Tamara Osborne is on the Category 2 Plan and states she is following her eating plan approximately 70% of the time. Tamara Osborne states she is walking the dog for 15-20 minutes 7 times per week.  Today's visit was #: 38 Starting weight: 208 lbs Starting date: 09/20/2019 Today's weight: 221 lbs Today's date: 03/13/2021 Total lbs lost to date: 0 Total lbs lost since last in-office visit: 0  Interim History: Tamara Osborne reports that over the holidays she went out to eat more frequently as her boyfriend was home and wanted to take her out.  She did get some walking in over the 2 weeks.  She has not been able to get to the [facility].  She feels she has been getting back on track over the last few days.  Subjective:   1. Type 2 diabetes mellitus without complication, without long-term current use of insulin (HCC) She has occasional nausea, but not constant, and seems to be in the afternoon.  She likes the once a week injection.  2. Hypertension associated with diabetes ([place]) BP is controlled today.  She denies chest pain, chest pressure, and headache.  Assessment/Plan:   1. Type 2 diabetes mellitus without complication, without long-term current use of insulin (HCC) Good blood sugar control is important to decrease the likelihood of diabetic complications such as nephropathy, neuropathy, limb loss, blindness, coronary artery disease, and death. Intensive lifestyle modification including diet, exercise and weight loss are the first line of treatment for diabetes. Increase Ozempic to 0.5 mg subcutaneously weekly.  - Increase Semaglutide,0.25 or 0.5MG /DOS, (OZEMPIC, 0.25 OR 0.5 MG/DOSE,) 2 MG/1.5ML SOPN; Inject 0.5 mg into the skin once a week.  Dispense: 1.5 mL; Refill: 0  2. Hypertension associated with diabetes ([place]) Tamara Osborne is working on healthy  weight loss and exercise to improve blood pressure control. We will watch for signs of hypotension as she continues her lifestyle modifications.  Continue Exforge.  Follow-up on BP at next appointment to try to titrate medication as tolerated.  3. Obesity with current BMI of 40.4  Tamara Osborne is currently in the action stage of change. As such, her goal is to continue with weight loss efforts. She has agreed to the Category 2 Plan.   Exercise goals: All adults should avoid inactivity. Some physical activity is better than none, and adults who participate in any amount of physical activity gain some health benefits.  Behavioral modification strategies: increasing lean protein intake and meal planning and cooking strategies.  Tamara Osborne has agreed to follow-up with our clinic in 3 weeks. She was informed of the importance of frequent follow-up visits to maximize her success with intensive lifestyle modifications for her multiple health conditions.   Objective:   Blood pressure 132/78, pulse 87, temperature 97.8 F (36.6 C), height 5\' 2"  (1.575 m), weight 221 lb (100.2 kg), SpO2 98 %. Body mass index is 40.42 kg/m.  General: Cooperative, alert, well developed, in no acute distress. HEENT: Conjunctivae and lids unremarkable. Cardiovascular: Regular rhythm.  Lungs: Normal work of breathing. Neurologic: No focal deficits.   Lab Results  Component Value Date   CREATININE 1.44 (H) 12/12/2020   BUN 34 (H) 12/12/2020   NA 137 12/12/2020   K 4.1 12/12/2020   CL 105 12/12/2020   CO2 25 12/12/2020   Lab Results  Component Value Date   ALT 70 (H) 12/12/2020  AST 49 (H) 12/12/2020   ALKPHOS 92 12/12/2020   BILITOT 1.0 12/12/2020   Lab Results  Component Value Date   HGBA1C 5.2 01/13/2021   HGBA1C 7.5 (H) 10/18/2020   HGBA1C 6.0 (H) 05/20/2020   HGBA1C 5.2 10/30/2019   HGBA1C CANCELED 09/20/2019   Lab Results  Component Value Date   INSULIN 40.3 (H) 09/20/2019   Lab Results   Component Value Date   TSH 1.910 09/20/2019   Lab Results  Component Value Date   CHOL 214 (H) 05/20/2020   HDL 32 (L) 05/20/2020   LDLCALC 145 (H) 05/20/2020   TRIG 184 (H) 05/20/2020   CHOLHDL 6.7 05/20/2020   Lab Results  Component Value Date   VD25OH 59.37 10/18/2020   VD25OH 45.60 05/20/2020   VD25OH 69.37 01/22/2020   Lab Results  Component Value Date   WBC 7.6 12/12/2020   HGB 13.1 12/12/2020   HCT 37.9 12/12/2020   MCV 80.6 12/12/2020   PLT 304 12/12/2020   Lab Results  Component Value Date   IRON 17 (L) 03/27/2019   TIBC 283 03/27/2019   FERRITIN 32 03/27/2019   Attestation Statements:   Reviewed by clinician on day of visit: allergies, medications, problem list, medical history, surgical history, family history, social history, and previous encounter notes.  I, Water quality scientist, CMA, am acting as transcriptionist for Coralie Common, MD. I have reviewed the above documentation for accuracy and completeness, and I agree with the above. - Coralie Common, MD

## 2021-03-20 ENCOUNTER — Other Ambulatory Visit (INDEPENDENT_AMBULATORY_CARE_PROVIDER_SITE_OTHER): Payer: Self-pay | Admitting: Family Medicine

## 2021-03-20 DIAGNOSIS — E119 Type 2 diabetes mellitus without complications: Secondary | ICD-10-CM

## 2021-04-03 ENCOUNTER — Inpatient Hospital Stay: Payer: Medicaid Other | Attending: Gynecologic Oncology

## 2021-04-03 ENCOUNTER — Other Ambulatory Visit: Payer: Self-pay

## 2021-04-03 DIAGNOSIS — Z8541 Personal history of malignant neoplasm of cervix uteri: Secondary | ICD-10-CM | POA: Insufficient documentation

## 2021-04-03 DIAGNOSIS — Z452 Encounter for adjustment and management of vascular access device: Secondary | ICD-10-CM | POA: Insufficient documentation

## 2021-04-03 DIAGNOSIS — Z95828 Presence of other vascular implants and grafts: Secondary | ICD-10-CM

## 2021-04-03 DIAGNOSIS — C539 Malignant neoplasm of cervix uteri, unspecified: Secondary | ICD-10-CM

## 2021-04-03 MED ORDER — HEPARIN SOD (PORK) LOCK FLUSH 100 UNIT/ML IV SOLN
500.0000 [IU] | Freq: Once | INTRAVENOUS | Status: AC
  Administered 2021-04-03: 500 [IU]

## 2021-04-03 MED ORDER — SODIUM CHLORIDE 0.9% FLUSH
10.0000 mL | Freq: Once | INTRAVENOUS | Status: AC
  Administered 2021-04-03: 10 mL

## 2021-04-07 ENCOUNTER — Ambulatory Visit (INDEPENDENT_AMBULATORY_CARE_PROVIDER_SITE_OTHER): Payer: Medicaid Other | Admitting: Family Medicine

## 2021-04-07 ENCOUNTER — Encounter (INDEPENDENT_AMBULATORY_CARE_PROVIDER_SITE_OTHER): Payer: Self-pay | Admitting: Family Medicine

## 2021-04-07 ENCOUNTER — Other Ambulatory Visit: Payer: Self-pay

## 2021-04-07 VITALS — BP 139/84 | HR 87 | Temp 98.5°F | Ht 62.0 in | Wt 224.0 lb

## 2021-04-07 DIAGNOSIS — Z6841 Body Mass Index (BMI) 40.0 and over, adult: Secondary | ICD-10-CM

## 2021-04-07 DIAGNOSIS — Z6839 Body mass index (BMI) 39.0-39.9, adult: Secondary | ICD-10-CM

## 2021-04-07 DIAGNOSIS — E669 Obesity, unspecified: Secondary | ICD-10-CM

## 2021-04-07 DIAGNOSIS — Z7985 Long-term (current) use of injectable non-insulin antidiabetic drugs: Secondary | ICD-10-CM

## 2021-04-07 DIAGNOSIS — E1165 Type 2 diabetes mellitus with hyperglycemia: Secondary | ICD-10-CM

## 2021-04-07 DIAGNOSIS — I152 Hypertension secondary to endocrine disorders: Secondary | ICD-10-CM | POA: Diagnosis not present

## 2021-04-07 DIAGNOSIS — E1159 Type 2 diabetes mellitus with other circulatory complications: Secondary | ICD-10-CM | POA: Diagnosis not present

## 2021-04-07 MED ORDER — OZEMPIC (0.25 OR 0.5 MG/DOSE) 2 MG/1.5ML ~~LOC~~ SOPN: 0.5000 mg | PEN_INJECTOR | SUBCUTANEOUS | 0 refills | Status: DC

## 2021-04-07 NOTE — Progress Notes (Signed)
Chief Complaint:   OBESITY Tamara Osborne is here to discuss her progress with her obesity treatment plan along with follow-up of her obesity related diagnoses. Tamara Osborne is on the Category 2 Plan and states she is following her eating plan approximately 90% of the time. Tamara Osborne states she is not currently exercising.  Today's visit was #: 61 Starting weight: 208 lbs Starting date: 09/20/2019 Today's weight: 224 lbs Today's date: 04/07/2021 Total lbs lost to date: 0 Total lbs lost since last in-office visit: 0  Interim History: Pt has had quite a few appts recently that has kept her from exercising. She feels she has gotten back on track with food and following category 2. Pt is planning to get back to [facility] this week. She is doing eggs and berries in morning. Typical lunch is chicken tenders and veggies and snack is yogurt.  Subjective:   1. Type 2 diabetes mellitus with hyperglycemia, without long-term current use of insulin (HCC) Pt is on Ozempic 0.5 mg with no GI side effects.  2. Hypertension associated with diabetes ([place]) BP well controlled. Pt denies chest pain/chest pressure/headache.  Assessment/Plan:   1. Type 2 diabetes mellitus with hyperglycemia, without long-term current use of insulin (HCC) Good blood sugar control is important to decrease the likelihood of diabetic complications such as nephropathy, neuropathy, limb loss, blindness, coronary artery disease, and death. Intensive lifestyle modification including diet, exercise and weight loss are the first line of treatment for diabetes.   Refill- Semaglutide,0.25 or 0.5MG /DOS, (OZEMPIC, 0.25 OR 0.5 MG/DOSE,) 2 MG/1.5ML SOPN; Inject 0.5 mg into the skin once a week.  Dispense: 1.5 mL; Refill: 0  2. Hypertension associated with diabetes ([place]) Tamara Osborne is working on healthy weight loss and exercise to improve blood pressure control. We will watch for signs of hypotension as she continues her lifestyle modifications. Continue  current meds with no change in meds. No refill needed.  3. Obesity with current BMI of 41.0 Tamara Osborne is currently in the action stage of change. As such, her goal is to continue with weight loss efforts. She has agreed to the Category 2 Plan.   Exercise goals: All adults should avoid inactivity. Some physical activity is better than none, and adults who participate in any amount of physical activity gain some health benefits.  Behavioral modification strategies: increasing lean protein intake, meal planning and cooking strategies, keeping healthy foods in the home, and planning for success.  Tamara Osborne has agreed to follow-up with our clinic in 2-3 weeks. She was informed of the importance of frequent follow-up visits to maximize her success with intensive lifestyle modifications for her multiple health conditions.   Objective:   Blood pressure 139/84, pulse 87, temperature 98.5 F (36.9 C), height 5\' 2"  (1.575 m), weight 224 lb (101.6 kg), SpO2 98 %. Body mass index is 40.97 kg/m.  General: Cooperative, alert, well developed, in no acute distress. HEENT: Conjunctivae and lids unremarkable. Cardiovascular: Regular rhythm.  Lungs: Normal work of breathing. Neurologic: No focal deficits.   Lab Results  Component Value Date   CREATININE 1.44 (H) 12/12/2020   BUN 34 (H) 12/12/2020   NA 137 12/12/2020   K 4.1 12/12/2020   CL 105 12/12/2020   CO2 25 12/12/2020   Lab Results  Component Value Date   ALT 70 (H) 12/12/2020   AST 49 (H) 12/12/2020   ALKPHOS 92 12/12/2020   BILITOT 1.0 12/12/2020   Lab Results  Component Value Date   HGBA1C 5.2 01/13/2021   HGBA1C  7.5 (H) 10/18/2020   HGBA1C 6.0 (H) 05/20/2020   HGBA1C 5.2 10/30/2019   HGBA1C CANCELED 09/20/2019   Lab Results  Component Value Date   INSULIN 40.3 (H) 09/20/2019   Lab Results  Component Value Date   TSH 1.910 09/20/2019   Lab Results  Component Value Date   CHOL 214 (H) 05/20/2020   HDL 32 (L) 05/20/2020    LDLCALC 145 (H) 05/20/2020   TRIG 184 (H) 05/20/2020   CHOLHDL 6.7 05/20/2020   Lab Results  Component Value Date   VD25OH 59.37 10/18/2020   VD25OH 45.60 05/20/2020   VD25OH 69.37 01/22/2020   Lab Results  Component Value Date   WBC 7.6 12/12/2020   HGB 13.1 12/12/2020   HCT 37.9 12/12/2020   MCV 80.6 12/12/2020   PLT 304 12/12/2020   Lab Results  Component Value Date   IRON 17 (L) 03/27/2019   TIBC 283 03/27/2019   FERRITIN 32 03/27/2019    Attestation Statements:   Reviewed by clinician on day of visit: allergies, medications, problem list, medical history, surgical history, family history, social history, and previous encounter notes.  Coral Ceo, CMA, am acting as transcriptionist for Coralie Common, MD.   I have reviewed the above documentation for accuracy and completeness, and I agree with the above. - Coralie Common, MD

## 2021-04-22 ENCOUNTER — Encounter (INDEPENDENT_AMBULATORY_CARE_PROVIDER_SITE_OTHER): Payer: Self-pay | Admitting: Family Medicine

## 2021-04-22 ENCOUNTER — Encounter: Payer: Self-pay | Admitting: Hematology and Oncology

## 2021-04-22 ENCOUNTER — Other Ambulatory Visit: Payer: Self-pay

## 2021-04-22 ENCOUNTER — Telehealth (INDEPENDENT_AMBULATORY_CARE_PROVIDER_SITE_OTHER): Payer: Medicaid Other | Admitting: Family Medicine

## 2021-04-22 DIAGNOSIS — Z6841 Body Mass Index (BMI) 40.0 and over, adult: Secondary | ICD-10-CM | POA: Diagnosis not present

## 2021-04-22 DIAGNOSIS — E1165 Type 2 diabetes mellitus with hyperglycemia: Secondary | ICD-10-CM | POA: Diagnosis not present

## 2021-04-22 DIAGNOSIS — Z7985 Long-term (current) use of injectable non-insulin antidiabetic drugs: Secondary | ICD-10-CM

## 2021-04-22 DIAGNOSIS — R7989 Other specified abnormal findings of blood chemistry: Secondary | ICD-10-CM

## 2021-04-22 DIAGNOSIS — E669 Obesity, unspecified: Secondary | ICD-10-CM | POA: Diagnosis not present

## 2021-04-22 MED ORDER — OZEMPIC (0.25 OR 0.5 MG/DOSE) 2 MG/1.5ML ~~LOC~~ SOPN: 0.5000 mg | PEN_INJECTOR | SUBCUTANEOUS | 0 refills | Status: DC

## 2021-04-22 NOTE — Progress Notes (Signed)
TeleHealth Visit:  Due to the COVID-19 pandemic, this visit was completed with telemedicine (audio/video) technology to reduce patient and provider exposure as well as to preserve personal protective equipment.   Taimane has verbally consented to this TeleHealth visit. The patient is located at home, the provider is located at the [facility] office. The participants in this visit include the listed provider and patient. The visit was conducted today via video.  Chief Complaint: OBESITY Danikah is here to discuss her progress with her obesity treatment plan along with follow-up of her obesity related diagnoses. Stellar is on the Category 2 Plan and states she is following her eating plan approximately 90% of the time. Kalisa states she is doing Silver Sneakers 60 minutes 3 times per week.  Today's visit was #: 28 Starting weight: 208 lbs Starting date: 09/20/2019  Interim History: Charrise woke up feeling ill this morning. She went out of town this past weekend and thinks she caught something there. Pt feels she did really well on plan and was weighing and measuring protein, as well as consistently exercising. She was weighing herself at the [facility] and was down 2-3 lbs. Pt has no upcoming plans.  Subjective:   1. Type 2 diabetes mellitus with hyperglycemia, without long-term current use of insulin (HCC) Pt is on Ozempic 0.5 mg. Her last POC A1c was 5.2 (questionable result, as prior A1c was 7.5).  2. Elevated serum creatinine Pt's creatinine on last check was 1.44. She is on amlodipine-valsartan.  Assessment/Plan:   1. Type 2 diabetes mellitus with hyperglycemia, without long-term current use of insulin (HCC) Good blood sugar control is important to decrease the likelihood of diabetic complications such as nephropathy, neuropathy, limb loss, blindness, coronary artery disease, and death. Intensive lifestyle modification including diet, exercise and weight loss are the  first line of treatment for diabetes. Stay at same dose given GI side effects she has currently (likely gastroenteritis). Repeat labs at next appt.  Refill- Semaglutide,0.25 or 0.5MG /DOS, (OZEMPIC, 0.25 OR 0.5 MG/DOSE,) 2 MG/1.5ML SOPN; Inject 0.5 mg into the skin once a week.  Dispense: 1.5 mL; Refill: 0  2. Elevated serum creatinine F/u BMP at next appt.  3. Obesity with current BMI of 41.0 Amori is currently in the action stage of change. As such, her goal is to continue with weight loss efforts. She has agreed to the Category 2 Plan.   Exercise goals:  As is  Behavioral modification strategies: increasing lean protein intake, meal planning and cooking strategies, keeping healthy foods in the home, and planning for success.  Tkeya has agreed to follow-up with our clinic in 3 weeks. She was informed of the importance of frequent follow-up visits to maximize her success with intensive lifestyle modifications for her multiple health conditions.  Objective:   VITALS: Per patient if applicable, see vitals. GENERAL: Alert and in no acute distress. CARDIOPULMONARY: No increased WOB. Speaking in clear sentences.  PSYCH: Pleasant and cooperative. Speech normal rate and rhythm. Affect is appropriate. Insight and judgement are appropriate. Attention is focused, linear, and appropriate.  NEURO: Oriented as arrived to appointment on time with no prompting.   Lab Results  Component Value Date   CREATININE 1.44 (H) 12/12/2020   BUN 34 (H) 12/12/2020   NA 137 12/12/2020   K 4.1 12/12/2020   CL 105 12/12/2020   CO2 25 12/12/2020   Lab Results  Component Value Date   ALT 70 (H) 12/12/2020   AST 49 (H) 12/12/2020  ALKPHOS 92 12/12/2020   BILITOT 1.0 12/12/2020   Lab Results  Component Value Date   HGBA1C 5.2 01/13/2021   HGBA1C 7.5 (H) 10/18/2020   HGBA1C 6.0 (H) 05/20/2020   HGBA1C 5.2 10/30/2019   HGBA1C CANCELED 09/20/2019   Lab Results  Component Value Date   INSULIN 40.3  (H) 09/20/2019   Lab Results  Component Value Date   TSH 1.910 09/20/2019   Lab Results  Component Value Date   CHOL 214 (H) 05/20/2020   HDL 32 (L) 05/20/2020   LDLCALC 145 (H) 05/20/2020   TRIG 184 (H) 05/20/2020   CHOLHDL 6.7 05/20/2020   Lab Results  Component Value Date   VD25OH 59.37 10/18/2020   VD25OH 45.60 05/20/2020   VD25OH 69.37 01/22/2020   Lab Results  Component Value Date   WBC 7.6 12/12/2020   HGB 13.1 12/12/2020   HCT 37.9 12/12/2020   MCV 80.6 12/12/2020   PLT 304 12/12/2020   Lab Results  Component Value Date   IRON 17 (L) 03/27/2019   TIBC 283 03/27/2019   FERRITIN 32 03/27/2019    Attestation Statements:   Reviewed by clinician on day of visit: allergies, medications, problem list, medical history, surgical history, family history, social history, and previous encounter notes.  Coral Ceo, CMA, am acting as transcriptionist for Coralie Common, MD.  I have reviewed the above documentation for accuracy and completeness, and I agree with the above. - Coralie Common, MD

## 2021-04-29 ENCOUNTER — Other Ambulatory Visit: Payer: Self-pay

## 2021-04-29 ENCOUNTER — Encounter (INDEPENDENT_AMBULATORY_CARE_PROVIDER_SITE_OTHER): Payer: Self-pay | Admitting: Primary Care

## 2021-04-29 ENCOUNTER — Encounter (INDEPENDENT_AMBULATORY_CARE_PROVIDER_SITE_OTHER): Payer: Medicaid Other | Admitting: Primary Care

## 2021-04-29 VITALS — BP 143/83 | HR 99 | Temp 98.0°F | Ht 62.0 in | Wt 229.8 lb

## 2021-04-29 DIAGNOSIS — E119 Type 2 diabetes mellitus without complications: Secondary | ICD-10-CM

## 2021-04-29 DIAGNOSIS — I10 Essential (primary) hypertension: Secondary | ICD-10-CM

## 2021-04-29 MED ORDER — AMLODIPINE BESYLATE-VALSARTAN 10-320 MG PO TABS: 1.0000 | ORAL_TABLET | Freq: Every day | ORAL | 1 refills | Status: DC

## 2021-04-29 MED ORDER — METFORMIN HCL ER 500 MG PO TB24: 500.0000 mg | ORAL_TABLET | Freq: Every day | ORAL | 1 refills | Status: DC

## 2021-04-29 NOTE — Progress Notes (Signed)
error 

## 2021-04-29 NOTE — Addendum Note (Signed)
Addended by: Juluis Mire on: 04/29/2021 11:19 AM   Modules accepted: Level of Service

## 2021-04-29 NOTE — Progress Notes (Deleted)
Tamara Osborne is a 46 y.o. female presents for hypertension evaluation, Denies shortness of breath, headaches, chest pain or lower extremity edema, sudden onset, vision changes, unilateral weakness, dizziness, paresthesias   Patient {Actions; denies-reports:120008} adherence with medications.  Dietary habits include: *** Exercise habits include:*** Family / Social history: ***   Past Medical History:  Diagnosis Date   Anemia    Anxiety    Bartholin cyst    Cervical cancer ([place])    Chronic kidney disease    Constipation    Depression    Deviated septum    Diabetes mellitus without complication ([place])    Difficult intravenous access    used port for 05-09-2019 surgery   Fibroids    History of blood transfusion    2 units given 04-26-2019, has had total of 8 units since jan 2021   History of blood transfusion 1 unit given 05-30-19   iv fluids also given   History of kidney problems    History of radiation therapy 03/23/2019-05/04/2019   Cervical external beam   Dr Gery Pray   History of radiation therapy 05/09/2019-06/05/2019   vaginal brachytherapy   Dr Gery Pray   History of recent blood transfusion 05/16/2019   2 units given  per dr Pollyann Savoy at cancer center   Hypertension    Neuropathy    both thumbs   Obesity    Palpitations    PONV (postoperative nausea and vomiting)    Prediabetes    Preeclampsia 2006   Sleep apnea    no cpap used insurance would not cover, osa severe per pt   SOB (shortness of breath)    Swallowing difficulty    Past Surgical History:  Procedure Laterality Date   CESAREAN SECTION WITH BILATERAL TUBAL LIGATION  11/10/2004       DILATION AND CURETTAGE OF UTERUS  1997   IR IMAGING GUIDED PORT INSERTION  04/04/2019   OPERATIVE ULTRASOUND N/A 05/09/2019   Procedure: OPERATIVE ULTRASOUND;  Surgeon: Gery Pray, MD;  Location: [facility];  Service: Urology;  Laterality: N/A;   OPERATIVE ULTRASOUND N/A  05/15/2019   Procedure: OPERATIVE ULTRASOUND;  Surgeon: Gery Pray, MD;  Location: [facility];  Service: Urology;  Laterality: N/A;   OPERATIVE ULTRASOUND N/A 05/22/2019   Procedure: OPERATIVE ULTRASOUND;  Surgeon: Gery Pray, MD;  Location: [facility];  Service: Urology;  Laterality: N/A;   OPERATIVE ULTRASOUND N/A 05/29/2019   Procedure: OPERATIVE ULTRASOUND;  Surgeon: Gery Pray, MD;  Location: [facility];  Service: Urology;  Laterality: N/A;   OPERATIVE ULTRASOUND N/A 06/05/2019   Procedure: OPERATIVE ULTRASOUND;  Surgeon: Gery Pray, MD;  Location: [facility];  Service: Urology;  Laterality: N/A;   TANDEM RING INSERTION N/A 05/09/2019   Procedure: TANDEM RING INSERTION;  Surgeon: Gery Pray, MD;  Location: [facility];  Service: Urology;  Laterality: N/A;   TANDEM RING INSERTION N/A 05/15/2019   Procedure: TANDEM RING INSERTION;  Surgeon: Gery Pray, MD;  Location: [facility];  Service: Urology;  Laterality: N/A;   TANDEM RING INSERTION N/A 05/22/2019   Procedure: TANDEM RING INSERTION;  Surgeon: Gery Pray, MD;  Location: [facility];  Service: Urology;  Laterality: N/A;   TANDEM RING INSERTION N/A 05/29/2019   Procedure: TANDEM RING INSERTION;  Surgeon: Gery Pray, MD;  Location: [facility];  Service: Urology;  Laterality: N/A;   TANDEM RING INSERTION N/A  06/05/2019   Procedure: TANDEM RING INSERTION;  Surgeon: Gery Pray, MD;  Location: [facility];  Service: Urology;  Laterality: N/A;   Allergies  Allergen Reactions   Penicillins     Not sure a child allergy   Shellfish Allergy Swelling    Seafood also any kind   Sulfa Antibiotics     Not sure a child allergy   Current Outpatient Medications on File Prior to Visit  Medication Sig Dispense Refill   Accu-Chek Softclix Lancets lancets Use to check blood sugar TID. 100 each 2    amLODipine-valsartan (EXFORGE) 10-320 MG tablet Take 1 tablet by mouth daily. 90 tablet 1   Blood Glucose Monitoring Suppl (ACCU-CHEK GUIDE) w/Device KIT Use to check blood sugar TID. 1 kit 0   chlorhexidine (PERIDEX) 0.12 % solution SMARTSIG:By Mouth     Cholecalciferol (VITAMIN D3) 50 MCG (2000 UT) TABS Take 2,000 Units by mouth daily.     CLONIDINE HCL PO Take 0.1 mg by mouth in the morning and at bedtime. Pending BP readings     fluticasone (FLONASE) 50 MCG/ACT nasal spray Place 1 spray into both nostrils daily as needed for allergies or rhinitis.     glucose blood (ACCU-CHEK GUIDE) test strip Use to check blood sugar TID. 100 each 2   Insulin Pen Needle (BD PEN NEEDLE NANO 2ND GEN) 32G X 4 MM MISC 1 Package by Does not apply route 2 (two) times daily. 100 each 0   lidocaine-prilocaine (EMLA) cream Apply 1 application topically as needed. Apply to port access daily as needed 30 g 3   metFORMIN (GLUCOPHAGE-XR) 500 MG 24 hr tablet Take 1 tablet (500 mg total) by mouth daily with breakfast. 90 tablet 1   Misc. Devices MISC CMP with GFR 1 ampule 0   mupirocin ointment (BACTROBAN) 2 % 1 application 3 (three) times daily.     Semaglutide,0.25 or 0.5MG/DOS, (OZEMPIC, 0.25 OR 0.5 MG/DOSE,) 2 MG/1.5ML SOPN Inject 0.5 mg into the skin once a week. 1.5 mL 0   No current facility-administered medications on file prior to visit.   Social History   Socioeconomic History   Marital status: Single    Spouse name: Not on file   Number of children: 4   Years of education: Not on file   Highest education level: Not on file  Occupational History   Not on file  Tobacco Use   Smoking status: Never   Smokeless tobacco: Never  Vaping Use   Vaping Use: Never used  Substance and Sexual Activity   Alcohol use: No   Drug use: No   Sexual activity: Not Currently    Birth control/protection: Surgical  Other Topics Concern   Not on file  Social History Narrative   Lives with her boyfriend   Social  Determinants of Health   Financial Resource Strain: Not on file  Food Insecurity: Not on file  Transportation Needs: Not on file  Physical Activity: Not on file  Stress: Not on file  Social Connections: Not on file  Intimate Partner Violence: Not on file   Family History  Problem Relation Age of Onset   Brain cancer Mother        lung cancer to brain   Hypertension Mother    Cancer Mother    Depression Mother    Anxiety disorder Mother    Diabetes Father    Kidney disease Father    Cancer Father        kidney cancer  Heart failure Sister      OBJECTIVE:  There were no vitals filed for this visit.  Physical Exam   ROS  Last 3 Office BP readings: BP Readings from Last 3 Encounters:  04/07/21 139/84  03/13/21 132/78  03/10/21 (!) 146/78    BMET    Component Value Date/Time   NA 137 12/12/2020 1108   NA 140 09/20/2019 1106   K 4.1 12/12/2020 1108   CL 105 12/12/2020 1108   CO2 25 12/12/2020 1108   GLUCOSE 97 12/12/2020 1108   BUN 34 (H) 12/12/2020 1108   BUN 20 09/20/2019 1106   CREATININE 1.44 (H) 12/12/2020 1108   CALCIUM 9.3 12/12/2020 1108   GFRNONAA 46 (L) 12/12/2020 1108   GFRAA 55 (L) 09/20/2019 1106    Renal function: CrCl cannot be calculated (Patient's most recent lab result is older than the maximum 21 days allowed.).  Clinical ASCVD: Yes  The 10-year ASCVD risk score (Arnett DK, et al., 2019) is: 6.3%   Values used to calculate the score:     Age: 55 years     Sex: Female     Is Non-Hispanic African American: No     Diabetic: Yes     Tobacco smoker: No     Systolic Blood Pressure: 888 mmHg     Is BP treated: Yes     HDL Cholesterol: 32 mg/dL     Total Cholesterol: 214 mg/dL  ASCVD risk factors include- [place]   ASSESSMENT & PLAN:  No diagnosis found.  No orders of the defined types were placed in this encounter.   -Counseled on lifestyle modifications for blood pressure control including reduced dietary sodium, increased  exercise, weight reduction and adequate sleep. Also, educated patient about the risk for cardiovascular events, stroke and heart attack. Also counseled patient about the importance of medication adherence. If you participate in smoking, it is important to stop using tobacco as this will increase the risks associated with uncontrolled blood pressure.   -Hypertension longstanding/newly diagnosed currently *** on current medications. Patient {Is/is not:9024} adherent with current medications.   Goal BP:  For patients younger than 60: Goal BP < 130/80. For patients 60 and older: Goal BP < 140/90. For patients with diabetes: Goal BP < 130/80. Your most recent BP: ***  Minimize salt intake. Minimize alcohol intake    This note has been created with Surveyor, quantity. Any transcriptional errors are unintentional.   Kerin Perna, NP 04/29/2021, 11:02 AM

## 2021-04-29 NOTE — Addendum Note (Signed)
Addended by: Juluis Mire on: 04/29/2021 12:07 PM   Modules accepted: Orders

## 2021-04-29 NOTE — Progress Notes (Addendum)
Tamara Osborne, is a 46 y.o. female  NID:[id number]  RWE:[id number]  DOB - October 03, 1975  Chief Complaint  Patient presents with   Hypertension       Subjective:   [name] is a 46 y.o. female here today for a follow up visit for hypertension. Today she was rushing and did not take her Bp medication until she arrived. However, she checks her Bp at home and her reading range from systolic 619-509 and diastolic 32-67. She has not had to use clonidine for emergency elevated Bp in a while (3-4 months)  Patient has No headache, No chest pain, No abdominal pain - No Nausea, No new weakness tingling or numbness, No Cough - shortness of breath. She is followed by nutritionist for weight management and she was going to physical therapy which she felt was helping. However, she got to her max of visit and had to stop.   No problems updated.  Allergies  Allergen Reactions   Penicillins     Not sure a child allergy   Shellfish Allergy Swelling    Seafood also any kind   Sulfa Antibiotics     Not sure a child allergy    Past Medical History:  Diagnosis Date   Anemia    Anxiety    Bartholin cyst    Cervical cancer ([place])    Chronic kidney disease    Constipation    Depression    Deviated septum    Diabetes mellitus without complication ([place])    Difficult intravenous access    used port for 05-09-2019 surgery   Fibroids    History of blood transfusion    2 units given 04-26-2019, has had total of 8 units since jan 2021   History of blood transfusion 1 unit given 05-30-19   iv fluids also given   History of kidney problems    History of radiation therapy 03/23/2019-05/04/2019   Cervical external beam   Dr Gery Pray   History of radiation therapy 05/09/2019-06/05/2019   vaginal brachytherapy   Dr Gery Pray   History of recent blood transfusion 05/16/2019   2 units given  per dr Pollyann Savoy at cancer center   Hypertension    Neuropathy    both thumbs    Obesity    Palpitations    PONV (postoperative nausea and vomiting)    Prediabetes    Preeclampsia 2006   Sleep apnea    no cpap used insurance would not cover, osa severe per pt   SOB (shortness of breath)    Swallowing difficulty     Current Outpatient Medications on File Prior to Visit  Medication Sig Dispense Refill   Accu-Chek Softclix Lancets lancets Use to check blood sugar TID. 100 each 2   Blood Glucose Monitoring Suppl (ACCU-CHEK GUIDE) w/Device KIT Use to check blood sugar TID. 1 kit 0   chlorhexidine (PERIDEX) 0.12 % solution SMARTSIG:By Mouth     Cholecalciferol (VITAMIN D3) 50 MCG (2000 UT) TABS Take 2,000 Units by mouth daily.     CLONIDINE HCL PO Take 0.1 mg by mouth in the morning and at bedtime. Pending BP readings     fluticasone (FLONASE) 50 MCG/ACT nasal spray Place 1 spray into both nostrils daily as needed for allergies or rhinitis.     glucose blood (ACCU-CHEK GUIDE) test strip Use to check blood sugar TID. 100 each 2   Insulin Pen Needle (BD PEN NEEDLE NANO 2ND GEN) 32G X 4 MM MISC  1 Package by Does not apply route 2 (two) times daily. 100 each 0   lidocaine-prilocaine (EMLA) cream Apply 1 application topically as needed. Apply to port access daily as needed 30 g 3   Misc. Devices MISC CMP with GFR 1 ampule 0   mupirocin ointment (BACTROBAN) 2 % 1 application 3 (three) times daily.     Semaglutide,0.25 or 0.5MG/DOS, (OZEMPIC, 0.25 OR 0.5 MG/DOSE,) 2 MG/1.5ML SOPN Inject 0.5 mg into the skin once a week. 1.5 mL 0   No current facility-administered medications on file prior to visit.    Objective:  BP (!) 143/83 (BP Location: Right Arm, Patient Position: Sitting, Cuff Size: Large)    Pulse 99    Temp 98 F (36.7 C) (Oral)    Ht _0  (1.575 m)    Wt 229 lb 12.8 oz (104.2 kg)    SpO2 94%    BMI 42.03 kg/m    Exam General appearance : Awake, alert, not in any distress. Speech Clear. Not toxic looking HEENT: Atraumatic and Normocephalic, pupils equally  reactive to light and accomodation Neck: Supple, no JVD. No cervical lymphadenopathy.  Chest: Good air entry bilaterally, no added sounds  CVS: S1 S2 regular, no murmurs.  Abdomen: Bowel sounds present, Non tender and not distended with no gaurding, rigidity or rebound. Extremities: B/L Lower Ext shows no edema, both legs are warm to touch Neurology: Awake alert, and oriented X 3, Non focal Skin: No Rash  Data Review Lab Results  Component Value Date   HGBA1C 5.2 01/13/2021   HGBA1C 7.5 (H) 10/18/2020   HGBA1C 6.0 (H) 05/20/2020    Assessment & Plan  Tamara Osborne was seen today for hypertension.  Diagnoses and all orders for this visit:  Type 2 diabetes mellitus without complication, without long-term current use of insulin ([place]) ADA recommends the following therapeutic goals for glycemic control related to A1c measurements: Goal of therapy: Less than 6.5 hemoglobin A1c. Well controlled . She monitors foods that are high in carbohydrates are the following rice, potatoes, breads, sugars, and pastas.  Reduction in the intake (eating) will assist in lowering your blood sugars.  -     metFORMIN (GLUCOPHAGE-XR) 500 MG 24 hr tablet; Take 1 tablet (500 mg total) by mouth daily with breakfast.  Essential hypertension BP goal - < 130/80 Explained that having normal blood pressure is the goal and medications are helping to get to goal and maintain normal blood pressure. DIET: Limit salt intake, read nutrition labels to check salt content, limit fried and high fatty foods  Avoid using multisymptom OTC cold preparations that generally contain sudafed which can rise BP. Consult with pharmacist on best cold relief products to use for persons with HTN EXERCISE Discussed incorporating exercise such as walking - 30 minutes most days of the week and can do in 10 minute intervals    -     amLODipine-valsartan (EXFORGE) 10-320 MG tablet; Take 1 tablet by mouth daily.        Patient have been  counseled extensively about nutrition and exercise. Other issues discussed during this visit include: low cholesterol diet, weight control and daily exercise, foot care, annual eye examinations at Ophthalmology, importance of adherence with medications and regular follow-up. We also discussed long term complications of uncontrolled diabetes and hypertension.   No follow-ups on file.  The patient was given clear instructions to go to ER or return to medical center if symptoms don't improve, worsen or new problems develop. The patient verbalized  understanding. The patient was told to call to get lab results if they haven't heard anything in the next week.   This note has been created with Surveyor, quantity. Any transcriptional errors are unintentional.   Kerin Perna, NP 04/29/2021, 12:03 PM

## 2021-05-02 ENCOUNTER — Other Ambulatory Visit (INDEPENDENT_AMBULATORY_CARE_PROVIDER_SITE_OTHER): Payer: Self-pay

## 2021-05-02 ENCOUNTER — Telehealth (INDEPENDENT_AMBULATORY_CARE_PROVIDER_SITE_OTHER): Payer: Self-pay | Admitting: Primary Care

## 2021-05-02 NOTE — Telephone Encounter (Signed)
Tamara Osborne with pt's insurance is calling in for assistance. She says that the Rx was witting in November, pt has Medicaid. She is unable to fill Rx because it is showing that provider is no longer in network with Medicaid. Pharmacy would like further assistance with this.  ? ? ?Please assist further.  ?

## 2021-05-14 ENCOUNTER — Ambulatory Visit (INDEPENDENT_AMBULATORY_CARE_PROVIDER_SITE_OTHER): Payer: Medicaid Other | Admitting: Family Medicine

## 2021-05-14 ENCOUNTER — Encounter (INDEPENDENT_AMBULATORY_CARE_PROVIDER_SITE_OTHER): Payer: Self-pay | Admitting: Family Medicine

## 2021-05-14 ENCOUNTER — Other Ambulatory Visit: Payer: Self-pay

## 2021-05-14 VITALS — BP 141/87 | HR 88 | Temp 98.2°F | Ht 62.0 in | Wt 223.0 lb

## 2021-05-14 DIAGNOSIS — E669 Obesity, unspecified: Secondary | ICD-10-CM

## 2021-05-14 DIAGNOSIS — Z7985 Long-term (current) use of injectable non-insulin antidiabetic drugs: Secondary | ICD-10-CM

## 2021-05-14 DIAGNOSIS — E559 Vitamin D deficiency, unspecified: Secondary | ICD-10-CM

## 2021-05-14 DIAGNOSIS — R7401 Elevation of levels of liver transaminase levels: Secondary | ICD-10-CM | POA: Diagnosis not present

## 2021-05-14 DIAGNOSIS — E1165 Type 2 diabetes mellitus with hyperglycemia: Secondary | ICD-10-CM

## 2021-05-14 DIAGNOSIS — E1169 Type 2 diabetes mellitus with other specified complication: Secondary | ICD-10-CM

## 2021-05-14 DIAGNOSIS — Z6841 Body Mass Index (BMI) 40.0 and over, adult: Secondary | ICD-10-CM

## 2021-05-14 DIAGNOSIS — R0602 Shortness of breath: Secondary | ICD-10-CM

## 2021-05-14 DIAGNOSIS — E785 Hyperlipidemia, unspecified: Secondary | ICD-10-CM

## 2021-05-15 MED ORDER — OZEMPIC (0.25 OR 0.5 MG/DOSE) 2 MG/1.5ML ~~LOC~~ SOPN: 0.5000 mg | PEN_INJECTOR | SUBCUTANEOUS | 0 refills | Status: DC

## 2021-05-19 NOTE — Progress Notes (Signed)
? ? ? ?Chief Complaint:  ? ?OBESITY ?Tamara Osborne is here to discuss her progress with her obesity treatment plan along with follow-up of her obesity related diagnoses. Tamara Osborne is on the Category 2 Plan and states she is following her eating plan approximately 90% of the time. Tamara Osborne states she is doing aerobics for 60 minutes 3 times per week. ? ?Today's visit was #: 66 ?Starting weight: 208 lbs ?Starting date: 09/20/2019 ?Today's weight: 223 lbs ?Today's date: 05/14/2021 ?Total lbs lost to date: 0 ?Total lbs lost since last in-office visit: 0 ? ?Interim History: [facility] She went to the pool but then the heater broke so she hasn't gone back. She has started [employer]. She has been significantly hungry the last few weeks especially in the morning to early afternoon. She has had to increase food quantity due to hunger. She has no upcoming trips but planning on going to some cancer center events.   ? ?Subjective:  ? ?1. Type 2 diabetes mellitus with hyperglycemia, without long-term current use of insulin (Tamara Osborne) ?Tamara Osborne is on Ozempic weekly. He last A1C was in November 5.2. ? ?2. Transaminitis ?Tamara Osborne's last LFTs was slightly elevated.  ? ?3. Vitamin D deficiency ?Tamara Osborne denies nausea, vomiting and muscle weakness. She is on over the counter Vitamin D.  ? ?4. SOBOE (shortness of breath on exertion) ?Tamara Osborne's symptoms has been stable since her initial appointment.  ? ?5. Hyperlipidemia associated with type 2 diabetes mellitus ([place]) ?Tamara Osborne's last LDL was 145. Her Triglycerides was 184. Her HDL was 32. She is not on statin currently.  ? ?Assessment/Plan:  ? ?1. Type 2 diabetes mellitus with hyperglycemia, without long-term current use of insulin (Tamara Osborne) ?We will check A1C, insulin level port flush. We will refill Ozempic 0.5 mg weekly for 1 month with no refills. Good blood sugar control is important to decrease the likelihood of diabetic complications such as nephropathy, neuropathy, limb  loss, blindness, coronary artery disease, and death. Intensive lifestyle modification including diet, exercise and weight loss are the first line of treatment for diabetes.  ? ?- Hemoglobin A1c ?- Insulin, random ?- Semaglutide,0.25 or 0.'5MG'$ /DOS, (OZEMPIC, 0.25 OR 0.5 MG/DOSE,) 2 MG/1.5ML SOPN; Inject 0.5 mg into the skin once a week.  Dispense: 1.5 mL; Refill: 0 ? ?2. Transaminitis ?Tamara Osborne has CMP checked at [facility].  ? ?- Comprehensive metabolic panel ? ?3. Vitamin D deficiency ?Low Vitamin D level contributes to fatigue and are associated with obesity, breast, and colon cancer. Tamara Osborne has Vitamin D checked at [facility] port flush and she will follow-up for routine testing of Vitamin D, at least 2-3 times per year to avoid over-replacement. ? ?- VITAMIN D 25 Hydroxy (Vit-D Deficiency, Fractures) ? ?4. SOBOE (shortness of breath on exertion) ?IC was completed today. Tamara Osborne does feel that she gets out of breath more easily that she used to when she exercises. Tamara Osborne's shortness of breath appears to be obesity related and exercise induced. She has agreed to work on weight loss and gradually increase exercise to treat her exercise induced shortness of breath. Will continue to monitor closely.  ? ?5. Hyperlipidemia associated with type 2 diabetes mellitus ([place]) ?Cardiovascular risk and specific lipid/LDL goals reviewed.  FLP was ordered today. We discussed several lifestyle modifications today and Tamara Osborne will continue to work on diet, exercise and weight loss efforts. Orders and follow up as documented in patient record.  ? ?Counseling ?Intensive lifestyle modifications are the first line treatment for this issue. ?Dietary changes:  Increase soluble fiber. Decrease simple carbohydrates. ?Exercise changes: Moderate to vigorous-intensity aerobic activity 150 minutes per week if tolerated. ?Lipid-lowering medications: see documented in medical record. ? ?- Lipid Panel With LDL/HDL Ratio ? ?6. Obesity  with current BMI of 41.0 ?Tamara Osborne is currently in the action stage of change. As such, her goal is to continue with weight loss efforts. She has agreed to the Category 3 Plan plus 100 calories.  ? ?Exercise goals:  As is. ? ?Behavioral modification strategies: increasing lean protein intake, meal planning and cooking strategies, keeping healthy foods in the home, and planning for success. ? ?Tamara Osborne has agreed to follow-up with our clinic in 4 weeks. She was informed of the importance of frequent follow-up visits to maximize her success with intensive lifestyle modifications for her multiple health conditions.  ? ?Tamara Osborne was informed we would discuss her lab results at her next visit unless there is a critical issue that needs to be addressed sooner. Tamara Osborne agreed to keep her next visit at the agreed upon time to discuss these results. ? ?Objective:  ? ?Blood pressure (!) 141/87, pulse 88, temperature 98.2 ?F (36.8 ?C), height '5\' 2"'$  (1.575 m), weight 223 lb (101.2 kg), SpO2 97 %. ?Body mass index is 40.79 kg/m?. ? ?General: Cooperative, alert, well developed, in no acute distress. ?HEENT: Conjunctivae and lids unremarkable. ?Cardiovascular: Regular rhythm.  ?Lungs: Normal work of breathing. ?Neurologic: No focal deficits.  ? ?Lab Results  ?Component Value Date  ? CREATININE 1.44 (H) 12/12/2020  ? BUN 34 (H) 12/12/2020  ? NA 137 12/12/2020  ? K 4.1 12/12/2020  ? CL 105 12/12/2020  ? CO2 25 12/12/2020  ? ?Lab Results  ?Component Value Date  ? ALT 70 (H) 12/12/2020  ? AST 49 (H) 12/12/2020  ? ALKPHOS 92 12/12/2020  ? BILITOT 1.0 12/12/2020  ? ?Lab Results  ?Component Value Date  ? HGBA1C 5.2 01/13/2021  ? HGBA1C 7.5 (H) 10/18/2020  ? HGBA1C 6.0 (H) 05/20/2020  ? HGBA1C 5.2 10/30/2019  ? HGBA1C CANCELED 09/20/2019  ? ?Lab Results  ?Component Value Date  ? INSULIN 40.3 (H) 09/20/2019  ? ?Lab Results  ?Component Value Date  ? TSH 1.910 09/20/2019  ? ?Lab Results  ?Component Value Date  ? CHOL 214 (H) 05/20/2020  ?  HDL 32 (L) 05/20/2020  ? LDLCALC 145 (H) 05/20/2020  ? TRIG 184 (H) 05/20/2020  ? CHOLHDL 6.7 05/20/2020  ? ?Lab Results  ?Component Value Date  ? VD25OH 59.37 10/18/2020  ? VD25OH 45.60 05/20/2020  ? VD25OH 69.37 01/22/2020  ? ?Lab Results  ?Component Value Date  ? WBC 7.6 12/12/2020  ? HGB 13.1 12/12/2020  ? HCT 37.9 12/12/2020  ? MCV 80.6 12/12/2020  ? PLT 304 12/12/2020  ? ?Lab Results  ?Component Value Date  ? IRON 17 (L) 03/27/2019  ? TIBC 283 03/27/2019  ? FERRITIN 32 03/27/2019  ? ? ?Attestation Statements:  ? ?Reviewed by clinician on day of visit: allergies, medications, problem list, medical history, surgical history, family history, social history, and previous encounter notes. ? ?I, Lizbeth Bark, RMA, am acting as transcriptionist for Coralie Common, MD. ?I have reviewed the above documentation for accuracy and completeness, and I agree with the above. Coralie Common, MD ? ?

## 2021-05-21 ENCOUNTER — Ambulatory Visit (INDEPENDENT_AMBULATORY_CARE_PROVIDER_SITE_OTHER): Payer: Medicaid Other | Admitting: Family Medicine

## 2021-06-02 ENCOUNTER — Other Ambulatory Visit: Payer: Self-pay | Admitting: Oncology

## 2021-06-02 DIAGNOSIS — E119 Type 2 diabetes mellitus without complications: Secondary | ICD-10-CM

## 2021-06-02 DIAGNOSIS — R7401 Elevation of levels of liver transaminase levels: Secondary | ICD-10-CM

## 2021-06-02 DIAGNOSIS — E559 Vitamin D deficiency, unspecified: Secondary | ICD-10-CM

## 2021-06-02 DIAGNOSIS — E785 Hyperlipidemia, unspecified: Secondary | ICD-10-CM

## 2021-06-02 NOTE — Progress Notes (Signed)
Lab orders from Dr. Jearld Shines entered so they can be drawn from cancer center lab. ?

## 2021-06-05 ENCOUNTER — Inpatient Hospital Stay: Payer: Medicaid Other | Attending: Gynecologic Oncology

## 2021-06-05 ENCOUNTER — Other Ambulatory Visit: Payer: Self-pay

## 2021-06-05 DIAGNOSIS — E119 Type 2 diabetes mellitus without complications: Secondary | ICD-10-CM

## 2021-06-05 DIAGNOSIS — C539 Malignant neoplasm of cervix uteri, unspecified: Secondary | ICD-10-CM | POA: Diagnosis present

## 2021-06-05 DIAGNOSIS — E559 Vitamin D deficiency, unspecified: Secondary | ICD-10-CM

## 2021-06-05 DIAGNOSIS — E785 Hyperlipidemia, unspecified: Secondary | ICD-10-CM

## 2021-06-05 DIAGNOSIS — Z95828 Presence of other vascular implants and grafts: Secondary | ICD-10-CM

## 2021-06-05 LAB — CBC WITH DIFFERENTIAL/PLATELET
Abs Immature Granulocytes: 0.04 10*3/uL (ref 0.00–0.07)
Basophils Absolute: 0 10*3/uL (ref 0.0–0.1)
Basophils Relative: 1 %
Eosinophils Absolute: 0.5 10*3/uL (ref 0.0–0.5)
Eosinophils Relative: 6 %
HCT: 38.9 % (ref 36.0–46.0)
Hemoglobin: 13.6 g/dL (ref 12.0–15.0)
Immature Granulocytes: 1 %
Lymphocytes Relative: 16 %
Lymphs Abs: 1.4 10*3/uL (ref 0.7–4.0)
MCH: 27.8 pg (ref 26.0–34.0)
MCHC: 35 g/dL (ref 30.0–36.0)
MCV: 79.4 fL — ABNORMAL LOW (ref 80.0–100.0)
Monocytes Absolute: 0.5 10*3/uL (ref 0.1–1.0)
Monocytes Relative: 6 %
Neutro Abs: 6.1 10*3/uL (ref 1.7–7.7)
Neutrophils Relative %: 70 %
Platelets: 300 10*3/uL (ref 150–400)
RBC: 4.9 MIL/uL (ref 3.87–5.11)
RDW: 13.8 % (ref 11.5–15.5)
WBC: 8.5 10*3/uL (ref 4.0–10.5)
nRBC: 0 % (ref 0.0–0.2)

## 2021-06-05 LAB — LIPID PANEL
Cholesterol: 221 mg/dL — ABNORMAL HIGH (ref 0–200)
HDL: 37 mg/dL — ABNORMAL LOW (ref >40)
LDL Cholesterol: 158 mg/dL — ABNORMAL HIGH (ref 0–99)
Total CHOL/HDL Ratio: 6 RATIO
Triglycerides: 130 mg/dL (ref <150)
VLDL: 26 mg/dL (ref 0–40)

## 2021-06-05 LAB — COMPREHENSIVE METABOLIC PANEL
ALT: 34 U/L (ref 0–44)
AST: 19 U/L (ref 15–41)
Albumin: 4.2 g/dL (ref 3.5–5.0)
Alkaline Phosphatase: 93 U/L (ref 38–126)
Anion gap: 6 (ref 5–15)
BUN: 28 mg/dL — ABNORMAL HIGH (ref 6–20)
CO2: 26 mmol/L (ref 22–32)
Calcium: 9.6 mg/dL (ref 8.9–10.3)
Chloride: 105 mmol/L (ref 98–111)
Creatinine, Ser: 1.47 mg/dL — ABNORMAL HIGH (ref 0.44–1.00)
GFR, Estimated: 45 mL/min — ABNORMAL LOW (ref >60)
Glucose, Bld: 105 mg/dL — ABNORMAL HIGH (ref 70–99)
Potassium: 4.4 mmol/L (ref 3.5–5.1)
Sodium: 137 mmol/L (ref 135–145)
Total Bilirubin: 0.5 mg/dL (ref 0.3–1.2)
Total Protein: 7.9 g/dL (ref 6.5–8.1)

## 2021-06-05 LAB — VITAMIN D 25 HYDROXY (VIT D DEFICIENCY, FRACTURES): Vit D, 25-Hydroxy: 66.26 ng/mL (ref 30–100)

## 2021-06-05 LAB — HEMOGLOBIN A1C
Hgb A1c MFr Bld: 5.4 % (ref 4.8–5.6)
Mean Plasma Glucose: 108.28 mg/dL

## 2021-06-05 MED ORDER — SODIUM CHLORIDE 0.9% FLUSH
10.0000 mL | Freq: Once | INTRAVENOUS | Status: AC
  Administered 2021-06-05: 10 mL

## 2021-06-08 NOTE — Progress Notes (Signed)
?Radiation Oncology         [phone] ?________________________________ ? ?Name: Tamara Osborne MRN: [id number]  ?Date: 06/09/2021  DOB: 03-Jun-1975 ? ?Follow-Up Visit Note ? ?CC: Kerin Perna, NP  Pllc, [facility] ? ?  ICD-10-CM   ?1. Squamous cell carcinoma of cervix (HCC)  C53.9   ?  ? ? ?Diagnosis: Stage II-B squamous cell carcinoma of the cervix ? ?Interval Since Last Radiation: 2 years and 5 days  ? ?External beam therapy was from 03/23/2019 - 05/04/2019. HDR vaginal brachytherapy was from 05/19/2019 - 06/05/2019. ?  ?Site Technique Total Dose (Gy) Dose per Fx (Gy) Completed Fx Beam Energies  ?Cervix: Cervix HDR-brachy 5.5/5.5 5.5 1/1 Ir-192  ?Cervix: Cervix HDR-brachy 5.5/5.5 5.5 1/1 Ir-192  ?Cervix: Cervix_Bst HDR-brachy 5.5/5.5 5.5 1/1 Ir-192  ?Cervix: Cervix HDR-brachy 5.5/5.5 5.5 1/1 Ir-192  ?Cervix: Cervix 3D 45/45 1.8 25/25 10X, 15X  ?Cervix: Cervix HDR-brachy 5.5/5.5 5.5 1/1 Ir-192  ?Cervix: Cervix_Bst 3D 9/9 1.8 5/5 15X  ? ? ?Narrative:  The patient returns today for routine 6 month follow-up, she was last seen here for follow up on 12/05/20. Since her last visit, the patient followed up with Dr. Berline Lopes on 03/10/21. During which time, the patient reported significant benefit from pelvis PT, and reported continued use of her vaginal dilator (with very minimal spotting). She also reported being sexually active about once a month on average, and reported a pinching sensation during intercourse when she does not use a lubricant. Otherwise, the patient denied any other symptoms and was noted as NED on examination. (She was again encouraged to continue using her vaginal dilator and lubricants).    ? ?Otherwise, no significant interval history since the patient was last seen.    ? ?Of note: the patient has maintained her port due to having up for access for blood draws.                  ? ?She reports no new symptoms.  Overall her urinary symptoms have improved significantly with  physical therapy.  She continues to have a little spotting after using her vaginal dilator but is using as recommended.  Intercourse is infrequent due to her husband being a long-distance Administrator.    ? ?Allergies:  is allergic to penicillins, shellfish allergy, and sulfa antibiotics. ? ?Meds: ?Current Outpatient Medications  ?Medication Sig Dispense Refill  ? Accu-Chek Softclix Lancets lancets Use to check blood sugar TID. 100 each 2  ? amLODipine-valsartan (EXFORGE) 10-320 MG tablet Take 1 tablet by mouth daily. 90 tablet 1  ? Blood Glucose Monitoring Suppl (ACCU-CHEK GUIDE) w/Device KIT Use to check blood sugar TID. 1 kit 0  ? chlorhexidine (PERIDEX) 0.12 % solution SMARTSIG:By Mouth    ? Cholecalciferol (VITAMIN D3) 50 MCG (2000 UT) TABS Take 2,000 Units by mouth daily.    ? CLONIDINE HCL PO Take 0.1 mg by mouth in the morning and at bedtime. Pending BP readings    ? fluticasone (FLONASE) 50 MCG/ACT nasal spray Place 1 spray into both nostrils daily as needed for allergies or rhinitis.    ? glucose blood (ACCU-CHEK GUIDE) test strip Use to check blood sugar TID. 100 each 2  ? Insulin Pen Needle (BD PEN NEEDLE NANO 2ND GEN) 32G X 4 MM MISC 1 Package by Does not apply route 2 (two) times daily. 100 each 0  ? lidocaine-prilocaine (EMLA) cream Apply 1 application topically as needed. Apply to port access daily as needed 30 g  3  ? metFORMIN (GLUCOPHAGE-XR) 500 MG 24 hr tablet Take 1 tablet (500 mg total) by mouth daily with breakfast. 90 tablet 1  ? Misc. Devices MISC CMP with GFR 1 ampule 0  ? mupirocin ointment (BACTROBAN) 2 % 1 application 3 (three) times daily.    ? Semaglutide,0.25 or 0.5MG/DOS, (OZEMPIC, 0.25 OR 0.5 MG/DOSE,) 2 MG/1.5ML SOPN Inject 0.5 mg into the skin once a week. 1.5 mL 0  ? ?No current facility-administered medications for this encounter.  ? ? ?Physical Findings: ?The patient is in no acute distress. Patient is alert and oriented. ? height is 5' 2"  (1.575 m) and weight is 227 lb 9.6 oz  (103.2 kg). Her temporal temperature is 98.1 ?F (36.7 ?C). Her blood pressure is 137/78 and her pulse is 94. Her respiration is 20 and oxygen saturation is 100%. .  No significant changes. Lungs are clear to auscultation bilaterally. Heart has regular rate and rhythm. No palpable cervical, supraclavicular, or axillary adenopathy. Abdomen soft, non-tender, normal bowel sounds. ? ?On pelvic examination the external genitalia were unremarkable. A speculum exam was performed. There are no mucosal lesions noted in the vaginal vault.  Significant radiation changes are noted in the upper vaginal area.  The cervical os is identified.. On bimanual and rectovaginal examination there were no pelvic masses appreciated.  Rectal sphincter tone normal. ? ? ? ?Lab Findings: ?Lab Results  ?Component Value Date  ? WBC 8.5 06/05/2021  ? HGB 13.6 06/05/2021  ? HCT 38.9 06/05/2021  ? MCV 79.4 (L) 06/05/2021  ? PLT 300 06/05/2021  ? ? ?Radiographic Findings: ?No results found. ? ?Impression:  Stage II-B squamous cell carcinoma of the cervix ? ?The patient is recovering from the effects of radiation.  She continues to have some diarrhea approximately 2 days/week but no bleeding.  Overall physical therapy has helped her urinary symptoms.  ? ?no evidence of recurrence on clinical exam today. ? ?Plan: She will follow-up with Dr. Berline Lopes in July.  Anticipate Pap smear at that time.  Routine follow-up in radiation oncology in October.  She will soon be able to advance to 62-monthinterval follow-ups. ? ? ?25 minutes of total time was spent for this patient encounter, including preparation, face-to-face counseling with the patient and coordination of care, physical exam, and documentation of the encounter. ?____________________________________ ? ?JBlair Promise PhD, MD ? ?This document serves as a record of services personally performed by JGery Pray MD. It was created on his behalf by ERoney Mans a trained medical scribe. The creation of  this record is based on the scribe's personal observations and the provider's statements to them. This document has been checked and approved by the attending provider. ? ?

## 2021-06-09 ENCOUNTER — Ambulatory Visit (INDEPENDENT_AMBULATORY_CARE_PROVIDER_SITE_OTHER): Payer: Medicaid Other | Admitting: Family Medicine

## 2021-06-09 ENCOUNTER — Ambulatory Visit
Admission: RE | Admit: 2021-06-09 | Discharge: 2021-06-09 | Disposition: A | Discharge status: Home / Self Care | Payer: Medicaid Other | Source: Ambulatory Visit | Attending: Radiation Oncology | Admitting: Radiation Oncology

## 2021-06-09 ENCOUNTER — Other Ambulatory Visit: Payer: Self-pay

## 2021-06-09 ENCOUNTER — Encounter: Payer: Self-pay | Admitting: Radiation Oncology

## 2021-06-09 VITALS — BP 137/78 | HR 94 | Temp 98.1°F | Resp 20 | Ht 62.0 in | Wt 227.6 lb

## 2021-06-09 DIAGNOSIS — Z923 Personal history of irradiation: Secondary | ICD-10-CM | POA: Insufficient documentation

## 2021-06-09 DIAGNOSIS — R197 Diarrhea, unspecified: Secondary | ICD-10-CM | POA: Insufficient documentation

## 2021-06-09 DIAGNOSIS — Z79899 Other long term (current) drug therapy: Secondary | ICD-10-CM | POA: Insufficient documentation

## 2021-06-09 DIAGNOSIS — Z7984 Long term (current) use of oral hypoglycemic drugs: Secondary | ICD-10-CM | POA: Insufficient documentation

## 2021-06-09 DIAGNOSIS — Z8541 Personal history of malignant neoplasm of cervix uteri: Secondary | ICD-10-CM | POA: Diagnosis present

## 2021-06-09 DIAGNOSIS — C539 Malignant neoplasm of cervix uteri, unspecified: Secondary | ICD-10-CM

## 2021-06-09 NOTE — Progress Notes (Signed)
Tamara Osborne is here today for follow up post radiation to the pelvic. ? ?They completed their radiation on: 06/05/2019  ? ?Does the patient complain of any of the following: ? ?Pain: 5/10 pelvic tenderness ?Abdominal bloating: Mild ?Diarrhea/Constipation: Diarrhea bi-weekly ?Nausea/Vomiting: Mild nausea on occasion ?Vaginal Discharge: No ?Blood in Urine or Stool: No ?Urinary Issues (dysuria/incomplete emptying/ incontinence/ increased frequency/urgency): Urinary urgency ?Does patient report using vaginal dilator 2-3 times a week and/or sexually active 2-3 weeks: Yes. Vaginal dilator 1-2 weekly, and sexually active once bi-monthly. ?Post radiation skin changes: No ? ? ?Additional comments if applicable: N/A ? ?

## 2021-06-23 ENCOUNTER — Encounter (INDEPENDENT_AMBULATORY_CARE_PROVIDER_SITE_OTHER): Payer: Self-pay | Admitting: Family Medicine

## 2021-06-23 ENCOUNTER — Ambulatory Visit (INDEPENDENT_AMBULATORY_CARE_PROVIDER_SITE_OTHER): Payer: Medicaid Other | Admitting: Family Medicine

## 2021-06-23 VITALS — BP 151/84 | HR 90 | Temp 97.8°F | Ht 62.0 in | Wt 227.0 lb

## 2021-06-23 DIAGNOSIS — Z7985 Long-term (current) use of injectable non-insulin antidiabetic drugs: Secondary | ICD-10-CM

## 2021-06-23 DIAGNOSIS — I152 Hypertension secondary to endocrine disorders: Secondary | ICD-10-CM

## 2021-06-23 DIAGNOSIS — E1159 Type 2 diabetes mellitus with other circulatory complications: Secondary | ICD-10-CM

## 2021-06-23 DIAGNOSIS — E1165 Type 2 diabetes mellitus with hyperglycemia: Secondary | ICD-10-CM

## 2021-06-23 DIAGNOSIS — E785 Hyperlipidemia, unspecified: Secondary | ICD-10-CM

## 2021-06-23 DIAGNOSIS — Z6841 Body Mass Index (BMI) 40.0 and over, adult: Secondary | ICD-10-CM

## 2021-06-23 DIAGNOSIS — E1169 Type 2 diabetes mellitus with other specified complication: Secondary | ICD-10-CM

## 2021-06-23 DIAGNOSIS — E669 Obesity, unspecified: Secondary | ICD-10-CM

## 2021-06-23 MED ORDER — ATORVASTATIN CALCIUM 10 MG PO TABS: 10.0000 mg | ORAL_TABLET | Freq: Every day | ORAL | 0 refills | Status: DC

## 2021-06-23 MED ORDER — OZEMPIC (0.25 OR 0.5 MG/DOSE) 2 MG/1.5ML ~~LOC~~ SOPN: 0.5000 mg | PEN_INJECTOR | SUBCUTANEOUS | 0 refills | Status: DC

## 2021-06-25 ENCOUNTER — Ambulatory Visit: Payer: Medicaid Other | Admitting: Family Medicine

## 2021-06-25 ENCOUNTER — Encounter: Payer: Self-pay | Admitting: Family Medicine

## 2021-06-25 VITALS — BP 168/95 | HR 86 | Ht 62.0 in | Wt 231.5 lb

## 2021-06-25 DIAGNOSIS — G4733 Obstructive sleep apnea (adult) (pediatric): Secondary | ICD-10-CM

## 2021-06-25 DIAGNOSIS — Z9989 Dependence on other enabling machines and devices: Secondary | ICD-10-CM | POA: Diagnosis not present

## 2021-06-25 NOTE — Progress Notes (Signed)
New order faxed to DME: [employer]  F: [phone]   Fax confirmation received  

## 2021-06-25 NOTE — Patient Instructions (Addendum)
Please continue using your CPAP regularly. While your insurance requires that you use CPAP at least 4 hours each night on 70% of the nights, I recommend, that you not skip any nights and use it throughout the night if you can. Getting used to CPAP and staying with the treatment long term does take time and patience and discipline. Untreated obstructive sleep apnea when it is moderate to severe can have an adverse impact on cardiovascular health and raise her risk for heart disease, arrhythmias, hypertension, congestive heart failure, stroke and diabetes. Untreated obstructive sleep apnea causes sleep disruption, nonrestorative sleep, and sleep deprivation. This can have an impact on your day to day functioning and cause daytime sleepiness and impairment of cognitive function, memory loss, mood disturbance, and problems focussing. Using CPAP regularly can improve these symptoms. ? ?Try to keep consistent sleep times of no more than 9 hours as much as possible try to increase your activity. Monitor weight. Keep an eye on your blood pressure.  ? ?Follow up in 6 months  ?

## 2021-06-25 NOTE — Progress Notes (Signed)
? ? ?PATIENT: Tamara Osborne ?DOB: 1975/03/11 ? ?REASON FOR VISIT: follow up ?HISTORY FROM: patient ? ?Chief Complaint  ?Patient presents with  ? Obstructive Sleep Apnea  ?  Rm 1, alone. Here for yearly CPAP f/u. Pt reports sleeping well on CPAP. Pt is sleeping heavy and is having urinary accidents. Sleeping long hours. Pt worried about settings in machine causing her to feel something in her chest.   ?  ? ?HISTORY OF PRESENT ILLNESS: ? ?06/25/21 ALL:  ?Tamara Osborne is a 46 y.o. female here today for follow up for OSA on CPAP.  She is doing well on therapy. She is using CPAP most every night for about 10 hours. She reports sleeping very well. She feels that she is sleeping so deeply that she is having urinary incontinence at night. She has a history of cervical cancer with radiation and chemo that has effected functioning of bladder. She reports sleeping up to 12-13 hours some nights. She reports more condensation in mask and feels that she may have more chest congestion on nights where she has slept longer. This occurs about 1-2 times a month. She usually goes to bed around 9pm. She likes to get up early in the morning but sometimes sleeps until 9:30. She has not been as active. She has gained 15 pounds. She does meet with her cancer support group regularly during the week. She likes to swim. She reports BP is usually well managed. She just took morning BP medication while waiting for out visit. She is no longer having to take clonidine.  ? ? ? ?HISTORY: (copied from Dr Guadelupe Sabin previous note) ? ?Tamara Osborne is a 46 year old right-handed woman with an underlying medical history of palpitations, hypertension, neuropathy, diabetes, history of cervical cancer, anxiety, anemia, and obesity, who presents for follow-up consultation of her obstructive sleep apnea after interim home sleep testing and starting AutoPap therapy.  The patient is unaccompanied today.  I first met her at the request of Dr. Adair Patter on  11/21/2019, at which time she reported a prior diagnosis of sleep apnea but she has never been on CPAP therapy before as insurance did not cover her machine.  She had a home sleep test on 12/10/2020 which indicated severe sleep apnea with an AHI of 37.8/h, O2 nadir 86%.  She was advised to start AutoPap therapy, her set up date was 12/22/2020. ?  ?Today, 06/25/2020: I reviewed her AutoPap compliance data from 05/24/2020 through 06/23/2020, which is a total of 31 minutes, during which time she used her machine every night with percent use days greater than 4 hours at 90.3%, indicating excellent compliance, average usage of 6 hours and 14 minutes, residual AHI at goal at 0.2/h, leak and no, 95th percentile of pressure at 8 cm.  She reports having adjusted well to treatment.  She uses a DreamWear fullface mask, size small.  She likes this mask.  She has benefited from treatment quite significantly.  She reports better quality sleep, more sleep consolidation, less daytime somnolence to the point where she does not need to take any naps.  Previously, she would take longer naps.  She also has noticed improvement in her nocturia.  She still has issues with urinary incontinence since her cervical cancer surgeries and she has benefited from physical therapy for this.  She does have to wear protection at night.  She has also noticed improvement in her blood pressure.  In fact, she has not needed to use her clonidine since starting  her AutoPap.  She is working on weight loss, she has been on Victoza.  She is highly motivated to continue with her AutoPap. ?  ?The patient's allergies, current medications, family history, past medical history, past social history, past surgical history and problem list were reviewed and updated as appropriate.  ?  ?Previously:  ?  ?11/21/19: (She) was diagnosed with obstructive sleep apnea several years ago.  She reports significant daytime somnolence.  Prior sleep study results are not available for  my review today.  She reports that sleep study testing was done in [place] about 7 or 8 years ago.  She qualified for CPAP therapy but her insurance would not cover it at the time.  She has had weight fluctuations.  She was diagnosed with cervical cancer earlier in the year and underwent surgery, radiation and chemotherapy.  She had lost weight, down to 180 pounds but gained a lot of weight back.  She is in the process of trying to lose weight.  She recently started Victoza. I reviewed your office note from 10/19/2019.  Her Epworth sleepiness score is 14 out of 24 today, fatigue severity score is 32 out of 63.  His snoring is loud and disturbs her boyfriend.  She lives with her boyfriend, they have 1 dog in the household.  She has 3 grown children, ages 41, 56 and 40.  She is currently not working.  She is hoping to go back to work after October.  She has an appointment with her oncologist in early October.  Bedtime is around 9 or 9:30 PM and rise time generally around 8 or 9.  She does wake up around 5 or 5:30 AM to let the dog out but goes back to bed.  She has nocturia about once per average night, she denies recurrent morning headaches.  She has 1 niece with sleep apnea.  She is trying to reduce her caffeine intake.  She drinks approximately 1 serving per day currently.  She does not drink alcohol, she is a non-smoker.  She would be willing to get rechecked for sleep apnea and consider CPAP therapy. ? ? ?REVIEW OF SYSTEMS: Out of a complete 14 system review of symptoms, the patient complains only of the following symptoms, urinary incontinence, weight gain and all other reviewed systems are negative. ? ?ESS: 14 ? ?ALLERGIES: ?Allergies  ?Allergen Reactions  ? Penicillins   ?  Not sure a child allergy  ? Shellfish Allergy Swelling  ?  Seafood also any kind  ? Sulfa Antibiotics   ?  Not sure a child allergy  ? ? ?HOME MEDICATIONS: ?Outpatient Medications Prior to Visit  ?Medication Sig Dispense Refill  ? Accu-Chek  Softclix Lancets lancets Use to check blood sugar TID. 100 each 2  ? amLODipine-valsartan (EXFORGE) 10-320 MG tablet Take 1 tablet by mouth daily. 90 tablet 1  ? atorvastatin (LIPITOR) 10 MG tablet Take 1 tablet (10 mg total) by mouth daily. 30 tablet 0  ? Blood Glucose Monitoring Suppl (ACCU-CHEK GUIDE) w/Device KIT Use to check blood sugar TID. 1 kit 0  ? chlorhexidine (PERIDEX) 0.12 % solution SMARTSIG:By Mouth    ? Cholecalciferol (VITAMIN D3) 50 MCG (2000 UT) TABS Take 2,000 Units by mouth daily.    ? CLONIDINE HCL PO Take 0.1 mg by mouth in the morning and at bedtime. Pending BP readings    ? fluticasone (FLONASE) 50 MCG/ACT nasal spray Place 1 spray into both nostrils daily as needed for allergies or rhinitis.    ?  glucose blood (ACCU-CHEK GUIDE) test strip Use to check blood sugar TID. 100 each 2  ? Insulin Pen Needle (BD PEN NEEDLE NANO 2ND GEN) 32G X 4 MM MISC 1 Package by Does not apply route 2 (two) times daily. 100 each 0  ? lidocaine-prilocaine (EMLA) cream Apply 1 application topically as needed. Apply to port access daily as needed 30 g 3  ? metFORMIN (GLUCOPHAGE-XR) 500 MG 24 hr tablet Take 1 tablet (500 mg total) by mouth daily with breakfast. 90 tablet 1  ? Misc. Devices MISC CMP with GFR 1 ampule 0  ? mupirocin ointment (BACTROBAN) 2 % 1 application 3 (three) times daily.    ? Semaglutide,0.25 or 0.5MG/DOS, (OZEMPIC, 0.25 OR 0.5 MG/DOSE,) 2 MG/1.5ML SOPN Inject 0.5 mg into the skin once a week. 1.5 mL 0  ? ?No facility-administered medications prior to visit.  ? ? ?PAST MEDICAL HISTORY: ?Past Medical History:  ?Diagnosis Date  ? Anemia   ? Anxiety   ? Bartholin cyst   ? Cervical cancer ([place])   ? Chronic kidney disease   ? Constipation   ? Depression   ? Deviated septum   ? Diabetes mellitus without complication ([place])   ? Difficult intravenous access   ? used port for 05-09-2019 surgery  ? Fibroids   ? History of blood transfusion   ? 2 units given 04-26-2019, has had total of 8 units since jan 2021   ? History of blood transfusion 1 unit given 05-30-19  ? iv fluids also given  ? History of kidney problems   ? History of radiation therapy 03/23/2019-05/04/2019  ? Cervical external beam   Dr Gery Pray

## 2021-07-03 DIAGNOSIS — E785 Hyperlipidemia, unspecified: Secondary | ICD-10-CM | POA: Insufficient documentation

## 2021-07-03 DIAGNOSIS — E1165 Type 2 diabetes mellitus with hyperglycemia: Secondary | ICD-10-CM | POA: Insufficient documentation

## 2021-07-03 NOTE — Progress Notes (Signed)
Chief Complaint:   OBESITY Tamara Osborne is here to discuss her progress with her obesity treatment plan along with follow-up of her obesity related diagnoses. Tamara Osborne is on the Category 3 Plan +100 calories and states she is following her eating plan approximately 90% of the time. Tamara Osborne states she is doing aerobics in the pool 60 minutes 1 time per week.  Today's visit was #: 20 Starting weight: 208 lbs Starting date: 09/20/2019 Today's weight: 227 lbs Today's date: 06/23/2021 Total lbs lost to date: 0 Total lbs lost since last in-office visit: 0  Interim History: Tamara Osborne has been busy since last appointment.  She has been trying to cut back on bread content since last appointment.  This morning she had 2 eggs and a handful of raspberries. Feels like she's going by Category 4 plan and feels discouraged by weight gain.   Subjective:   1. Type 2 diabetes mellitus with hyperglycemia, without long-term current use of insulin (HCC) Tamara Osborne is doing well on Ozempic, her A1c improved to 5.4.  2. Hyperlipidemia associated with type 2 diabetes mellitus ([place]) Tamara Osborne is not on a statin, but LDL goal is <70.  Discussed initiation of cholesterol controlling medications.  Risks and benefits of medications discussed today.   3. Hypertension associated with type 2 diabetes mellitus ([place]) Tamara Osborne's blood pressure is much better controlled with amlodipine, valsartan combination. Tamara Osborne denies any chest pain, chest pressure, or headaches.  Tamara Osborne's blood pressure was elevated today but previously labile.  Tamara Osborne has not taken emergency blood pressure medications in 3-4 months.  Assessment/Plan:   1. Type 2 diabetes mellitus with hyperglycemia, without long-term current use of insulin (HCC) Our plan today is to refill Ozempic.  See below.  - Semaglutide,0.25 or 0.'5MG'$ /DOS, (OZEMPIC, 0.25 OR 0.5 MG/DOSE,) 2 MG/1.5ML SOPN; Inject 0.5 mg into the skin once a week.  Dispense: 1.5 mL; Refill:  0  2. Hyperlipidemia associated with type 2 diabetes mellitus ([place]) Our plan today is to start Atorvastatin.  See below.  - atorvastatin (LIPITOR) 10 MG tablet; Take 1 tablet (10 mg total) by mouth daily.  Dispense: 30 tablet; Refill: 0  3. Hypertension associated with type 2 diabetes mellitus ([place]) Our plan today is to follow up with blood pressure at next appointment.  4. Obesity with current BMI of 41.7 Tamara Osborne is currently in the action stage of change. As such, her goal is to continue with weight loss efforts. She has agreed to the Category 3 Plan + 100 calories. Journaling 1450-1600 calories and 95+ protein.   Exercise goals: All adults should avoid inactivity. Some physical activity is better than none, and adults who participate in any amount of physical activity gain some health benefits. Tamara Osborne is doing quite a bit of activity through the cancer center and the [facility].   Behavioral modification strategies: increasing lean protein intake, meal planning and cooking strategies, keeping healthy foods in the home, and planning for success.  Tamara Osborne has agreed to follow-up with our clinic in 4 weeks. She was informed of the importance of frequent follow-up visits to maximize her success with intensive lifestyle modifications for her multiple health conditions.  Objective:   Blood pressure (!) 151/84, pulse 90, temperature 97.8 F (36.6 C), height '5\' 2"'$  (1.575 m), weight 227 lb (103 kg), SpO2 98 %. Body mass index is 41.52 kg/m.  General: Cooperative, alert, well developed, in no acute distress. HEENT: Conjunctivae and lids unremarkable. Cardiovascular: Regular rhythm.  Lungs: Normal work of breathing. Neurologic: No focal  deficits.   Lab Results  Component Value Date   CREATININE 1.47 (H) 06/05/2021   BUN 28 (H) 06/05/2021   NA 137 06/05/2021   K 4.4 06/05/2021   CL 105 06/05/2021   CO2 26 06/05/2021   Lab Results  Component Value Date   ALT 34 06/05/2021   AST 19  06/05/2021   ALKPHOS 93 06/05/2021   BILITOT 0.5 06/05/2021   Lab Results  Component Value Date   HGBA1C 5.4 06/05/2021   HGBA1C 5.2 01/13/2021   HGBA1C 7.5 (H) 10/18/2020   HGBA1C 6.0 (H) 05/20/2020   HGBA1C 5.2 10/30/2019   Lab Results  Component Value Date   INSULIN 40.3 (H) 09/20/2019   Lab Results  Component Value Date   TSH 1.910 09/20/2019   Lab Results  Component Value Date   CHOL 221 (H) 06/05/2021   HDL 37 (L) 06/05/2021   LDLCALC 158 (H) 06/05/2021   TRIG 130 06/05/2021   CHOLHDL 6.0 06/05/2021   Lab Results  Component Value Date   VD25OH 66.26 06/05/2021   VD25OH 59.37 10/18/2020   VD25OH 45.60 05/20/2020   Lab Results  Component Value Date   WBC 8.5 06/05/2021   HGB 13.6 06/05/2021   HCT 38.9 06/05/2021   MCV 79.4 (L) 06/05/2021   PLT 300 06/05/2021   Lab Results  Component Value Date   IRON 17 (L) 03/27/2019   TIBC 283 03/27/2019   FERRITIN 32 03/27/2019   Attestation Statements:   Reviewed by clinician on day of visit: allergies, medications, problem list, medical history, surgical history, family history, social history, and previous encounter notes.  I, Davy Pique, am acting as transcriptionist for Coralie Common, MD.  I have reviewed the above documentation for accuracy and completeness, and I agree with the above. - Coralie Common, MD

## 2021-07-17 ENCOUNTER — Ambulatory Visit (INDEPENDENT_AMBULATORY_CARE_PROVIDER_SITE_OTHER): Payer: Medicaid Other | Admitting: Family Medicine

## 2021-07-17 ENCOUNTER — Telehealth: Payer: Self-pay | Admitting: *Deleted

## 2021-07-17 NOTE — Telephone Encounter (Signed)
Called patient to inform of fu with Dr. Berline Lopes on 09-01-21 - arrival time- 2:15 pm, spoke with patient and she is aware of this appt.

## 2021-07-21 ENCOUNTER — Other Ambulatory Visit (INDEPENDENT_AMBULATORY_CARE_PROVIDER_SITE_OTHER): Payer: Self-pay | Admitting: Family Medicine

## 2021-07-21 DIAGNOSIS — E1169 Type 2 diabetes mellitus with other specified complication: Secondary | ICD-10-CM

## 2021-07-30 ENCOUNTER — Ambulatory Visit (INDEPENDENT_AMBULATORY_CARE_PROVIDER_SITE_OTHER): Payer: Medicaid Other | Admitting: Family Medicine

## 2021-07-30 ENCOUNTER — Encounter (INDEPENDENT_AMBULATORY_CARE_PROVIDER_SITE_OTHER): Payer: Self-pay | Admitting: Family Medicine

## 2021-07-30 VITALS — BP 124/81 | HR 85 | Temp 97.8°F | Ht 62.0 in | Wt 220.0 lb

## 2021-07-30 DIAGNOSIS — Z6841 Body Mass Index (BMI) 40.0 and over, adult: Secondary | ICD-10-CM

## 2021-07-30 DIAGNOSIS — E1165 Type 2 diabetes mellitus with hyperglycemia: Secondary | ICD-10-CM

## 2021-07-30 DIAGNOSIS — E1169 Type 2 diabetes mellitus with other specified complication: Secondary | ICD-10-CM | POA: Diagnosis not present

## 2021-07-30 DIAGNOSIS — E785 Hyperlipidemia, unspecified: Secondary | ICD-10-CM | POA: Diagnosis not present

## 2021-07-30 DIAGNOSIS — Z7985 Long-term (current) use of injectable non-insulin antidiabetic drugs: Secondary | ICD-10-CM

## 2021-07-30 DIAGNOSIS — E669 Obesity, unspecified: Secondary | ICD-10-CM

## 2021-07-30 MED ORDER — OZEMPIC (0.25 OR 0.5 MG/DOSE) 2 MG/1.5ML ~~LOC~~ SOPN: 0.5000 mg | PEN_INJECTOR | SUBCUTANEOUS | 0 refills | Status: DC

## 2021-07-30 MED ORDER — ATORVASTATIN CALCIUM 10 MG PO TABS: 10.0000 mg | ORAL_TABLET | Freq: Every day | ORAL | 0 refills | Status: DC

## 2021-08-05 NOTE — Progress Notes (Signed)
Chief Complaint:   OBESITY Tamara Osborne is here to discuss her progress with her obesity treatment plan along with follow-up of her obesity related diagnoses. Tamara Osborne is on the Category 3 Plan and keeping a food journal and adhering to recommended goals of 1450-1600 calories and 95 grams of protein and states she is following her eating plan approximately 90% of the time. Tamara Osborne states she is walking 1-2 miles 60 minutes 3-7 times per week.  Today's visit was #: 45 Starting weight: 208 lbs Starting date: 09/20/2019 Today's weight: 220 lbs Today's date: 07/30/2021 Total lbs lost to date: 0 lbs Total lbs lost since last in-office visit: 0  Interim History: Tamara Osborne recently adopted a dog that was a stray in her neighborhood. She has not been compliant with logging food but has taken pictures of food. She has noticed that she is not hungry in evenings with new meal plan. No upcoming plans for the next few weeks that would sabotage her.   Subjective:   1. Hyperlipidemia associated with type 2 diabetes mellitus ([place]) Tamara Osborne is currently taking Lipitor 10 mg daily. Denies Transminitis.  2. Type 2 diabetes mellitus with hyperglycemia, without long-term current use of insulin (HCC) Tamara Osborne is doing well on Ozempic. She is a bit nausea the day after injection but not much.  Assessment/Plan:   1. Hyperlipidemia associated with type 2 diabetes mellitus (HCC) We will refill Lipitor 10 mg daily for 3 months with 0 refills.  -Refill atorvastatin (LIPITOR) 10 MG tablet; Take 1 tablet (10 mg total) by mouth daily.  Dispense: 90 tablet; Refill: 0  2. Type 2 diabetes mellitus with hyperglycemia, without long-term current use of insulin (HCC) We will refill Ozempic 0.5 mg subcutaneous once weekly for 1 month with 0 refills.  -Refill Semaglutide,0.25 or 0.'5MG'$ /DOS, (OZEMPIC, 0.25 OR 0.5 MG/DOSE,) 2 MG/1.5ML SOPN; Inject 0.5 mg into the skin once a week.  Dispense: 1.5 mL; Refill: 0  3. Obesity  with current BMI of 40.3 Katelynne is currently in the action stage of change. As such, her goal is to continue with weight loss efforts. She has agreed to the Category 3 Plan.   Exercise goals: As is.  Behavioral modification strategies: increasing lean protein intake, meal planning and cooking strategies, keeping healthy foods in the home, and planning for success.  Tamara Osborne has agreed to follow-up with our clinic in 4 weeks. She was informed of the importance of frequent follow-up visits to maximize her success with intensive lifestyle modifications for her multiple health conditions.   Objective:   Blood pressure 124/81, pulse 85, temperature 97.8 F (36.6 C), height '5\' 2"'$  (1.575 m), weight 220 lb (99.8 kg), SpO2 99 %. Body mass index is 40.24 kg/m.  General: Cooperative, alert, well developed, in no acute distress. HEENT: Conjunctivae and lids unremarkable. Cardiovascular: Regular rhythm.  Lungs: Normal work of breathing. Neurologic: No focal deficits.   Lab Results  Component Value Date   CREATININE 1.47 (H) 06/05/2021   BUN 28 (H) 06/05/2021   NA 137 06/05/2021   K 4.4 06/05/2021   CL 105 06/05/2021   CO2 26 06/05/2021   Lab Results  Component Value Date   ALT 34 06/05/2021   AST 19 06/05/2021   ALKPHOS 93 06/05/2021   BILITOT 0.5 06/05/2021   Lab Results  Component Value Date   HGBA1C 5.4 06/05/2021   HGBA1C 5.2 01/13/2021   HGBA1C 7.5 (H) 10/18/2020   HGBA1C 6.0 (H) 05/20/2020   HGBA1C 5.2 10/30/2019  Lab Results  Component Value Date   INSULIN 40.3 (H) 09/20/2019   Lab Results  Component Value Date   TSH 1.910 09/20/2019   Lab Results  Component Value Date   CHOL 221 (H) 06/05/2021   HDL 37 (L) 06/05/2021   LDLCALC 158 (H) 06/05/2021   TRIG 130 06/05/2021   CHOLHDL 6.0 06/05/2021   Lab Results  Component Value Date   VD25OH 66.26 06/05/2021   VD25OH 59.37 10/18/2020   VD25OH 45.60 05/20/2020   Lab Results  Component Value Date   WBC 8.5  06/05/2021   HGB 13.6 06/05/2021   HCT 38.9 06/05/2021   MCV 79.4 (L) 06/05/2021   PLT 300 06/05/2021   Lab Results  Component Value Date   IRON 17 (L) 03/27/2019   TIBC 283 03/27/2019   FERRITIN 32 03/27/2019   Attestation Statements:   Reviewed by clinician on day of visit: allergies, medications, problem list, medical history, surgical history, family history, social history, and previous encounter notes.  I, Elnora Morrison, RMA am acting as transcriptionist for Coralie Common, MD.  I have reviewed the above documentation for accuracy and completeness, and I agree with the above. - Coralie Common, MD

## 2021-08-07 ENCOUNTER — Other Ambulatory Visit: Payer: Self-pay

## 2021-08-07 ENCOUNTER — Inpatient Hospital Stay: Payer: Medicaid Other | Attending: Gynecologic Oncology

## 2021-08-07 DIAGNOSIS — Z452 Encounter for adjustment and management of vascular access device: Secondary | ICD-10-CM | POA: Diagnosis not present

## 2021-08-07 DIAGNOSIS — Z95828 Presence of other vascular implants and grafts: Secondary | ICD-10-CM

## 2021-08-07 DIAGNOSIS — C539 Malignant neoplasm of cervix uteri, unspecified: Secondary | ICD-10-CM | POA: Insufficient documentation

## 2021-08-07 MED ORDER — HEPARIN SOD (PORK) LOCK FLUSH 100 UNIT/ML IV SOLN
500.0000 [IU] | Freq: Once | INTRAVENOUS | Status: AC
  Administered 2021-08-07: 500 [IU]

## 2021-08-07 MED ORDER — SODIUM CHLORIDE 0.9% FLUSH
10.0000 mL | Freq: Once | INTRAVENOUS | Status: AC
  Administered 2021-08-07: 10 mL

## 2021-08-28 ENCOUNTER — Encounter: Payer: Self-pay | Admitting: Gynecologic Oncology

## 2021-09-01 ENCOUNTER — Other Ambulatory Visit (HOSPITAL_COMMUNITY)
Admission: RE | Admit: 2021-09-01 | Discharge: 2021-09-01 | Disposition: A | Discharge status: Home / Self Care | Payer: Medicaid Other | Source: Ambulatory Visit | Attending: Gynecologic Oncology | Admitting: Gynecologic Oncology

## 2021-09-01 ENCOUNTER — Inpatient Hospital Stay: Payer: Medicaid Other | Attending: Gynecologic Oncology | Admitting: Gynecologic Oncology

## 2021-09-01 ENCOUNTER — Other Ambulatory Visit: Payer: Self-pay

## 2021-09-01 ENCOUNTER — Encounter: Payer: Self-pay | Admitting: Gynecologic Oncology

## 2021-09-01 VITALS — BP 153/87 | HR 109 | Temp 99.0°F | Resp 18 | Ht 62.0 in | Wt 222.0 lb

## 2021-09-01 DIAGNOSIS — N941 Unspecified dyspareunia: Secondary | ICD-10-CM | POA: Insufficient documentation

## 2021-09-01 DIAGNOSIS — Z923 Personal history of irradiation: Secondary | ICD-10-CM | POA: Insufficient documentation

## 2021-09-01 DIAGNOSIS — Z9221 Personal history of antineoplastic chemotherapy: Secondary | ICD-10-CM | POA: Diagnosis not present

## 2021-09-01 DIAGNOSIS — Z8541 Personal history of malignant neoplasm of cervix uteri: Secondary | ICD-10-CM | POA: Insufficient documentation

## 2021-09-01 DIAGNOSIS — C539 Malignant neoplasm of cervix uteri, unspecified: Secondary | ICD-10-CM | POA: Insufficient documentation

## 2021-09-01 NOTE — Patient Instructions (Addendum)
It was good to see you today.  I do not see or feel any evidence of cancer recurrence on your exam.  Because you are now more than 2 years out from finishing treatment, we will move to visits every 6 months.  We will continue to alternate these between radiation oncology and our clinic.  As always, between visits, if you develop any new and concerning symptoms, please call to see me sooner.  I put in a new order to get your port removed. Someone from radiology will call you to have port removed after your visit with Alvy Bimler on 10/23/21.  I am also putting in a new referral for pelvic floor physical therapy.  I will see you back for a visit in March 2024.  Please call sometime after the new year to get that visit scheduled.

## 2021-09-01 NOTE — Progress Notes (Signed)
Gynecologic Oncology Return Clinic Visit  09/01/21  Reason for Visit: Surveillance visit in the setting of history of cervix cancer  Treatment History: Oncology History  Squamous cell carcinoma of cervix ([place])  03/21/2019 Pathology Results   The biopsy is superficial and therefore depth of invasion cannot be  determined.  There is at least carcinoma in-situ.    03/23/2019 Initial Diagnosis   Squamous cell carcinoma of cervix ([place])   03/23/2019 Pathology Results   CERVIX, 11 O CLOCK, BIOPSY:  -  Squamous cell carcinoma, invasive  -  See comment   03/23/2019 - 06/05/2019 Radiation Therapy   External beam therapy was from 03/23/2019 - 05/04/2019. HDR vaginal brachytherapy was from 05/19/2019 - 06/05/2019.   Site Technique Total Dose (Gy) Dose per Fx (Gy) Completed Fx Beam Energies  Cervix: Cervix HDR-brachy 5.5/5.5 5.5 1/1 Ir-192  Cervix: Cervix HDR-brachy 5.5/5.5 5.5 1/1 Ir-192  Cervix: Cervix_Bst HDR-brachy 5.5/5.5 5.5 1/1 Ir-192  Cervix: Cervix HDR-brachy 5.5/5.5 5.5 1/1 Ir-192  Cervix: Cervix 3D 45/45 1.8 25/25 10X, 15X  Cervix: Cervix HDR-brachy 5.5/5.5 5.5 1/1 Ir-192  Cervix: Cervix_Bst 3D 9/9 1.8 5/5 15X      03/28/2019 PET scan   1. Hypermetabolic cervical mass measures roughly 7.6 by 6.9 by 4.9 cm, compatible with malignancy. No adenopathy or distant metastatic spread identified. 2. Extending cephalad and anterior to the uterine fundus, there is a 450 cubic cm simple fluid density structure without hypermetabolic activity. This may represent a right adnexal cyst (favored), peritoneal inclusion cyst, or (if the patient has a remote history of pancreatitis) a remote pseudocyst. 3. Faint ground density nodularity in both lower lobes which could be from atypical infection or extrinsic allergic alveolitis. Given nodular appearance of some of this ground-glass density, I would recommend follow up chest CT in 3 months time in order to ensure clearance. 4. Moderate cardiomegaly, cause  uncertain. The patient also has advanced for age/gender atherosclerosis. Aortic Atherosclerosis (ICD10-I70.0).     04/04/2019 Procedure   Successful placement of a right internal jugular approach power injectable Port-A-Cath. The catheter is ready for immediate use   04/07/2019 - 05/12/2019 Chemotherapy   The patient had weekly cisplatin for chemotherapy treatment.     09/06/2019 PET scan   1. No evidence residual hypermetabolic cervical tissue. 2. No evidence of metastatic cervical carcinoma. 3. Large benign cystic mass unchanged in the upper pelvis.        Pap 08/2020: ASCUS, HPV negative  Interval History: Saw Dr. Sondra Come in 05/2021.  Patient reports overall doing well.  She has been working part-time and is focusing on being more active.  She is enjoying swimming at club fitness and is taking a beach making class.  She switched from Victoza to a weekly injection.  She is working on Education officer, museum.  She uses her vaginal dilator when she remembers to, usually about twice a week.  Continues to have significant dyspareunia and fear around intercourse.  Has occasional vaginal spotting with intercourse.  Otherwise denies any vaginal bleeding or discharge.  Reports regular bowel and bladder function.  Still note some neuropathy, although this seems to have improved a little bit.  Feels that being in the pool is most helpful with her symptoms.  Past Medical/Surgical History: Past Medical History:  Diagnosis Date   Anemia    Anxiety    Bartholin cyst    Cervical cancer ([place])    Chronic kidney disease    Constipation    Depression    Deviated  septum    Diabetes mellitus without complication ([place])    Difficult intravenous access    used port for 05-09-2019 surgery   Fibroids    History of blood transfusion    2 units given 04-26-2019, has had total of 8 units since jan 2021   History of blood transfusion 1 unit given 05-30-19   iv fluids also given   History of kidney  problems    History of radiation therapy 03/23/2019-05/04/2019   Cervical external beam   Dr Gery [place]   History of radiation therapy 05/09/2019-06/05/2019   vaginal brachytherapy   Dr Gery Pray   History of recent blood transfusion 05/16/2019   2 units given  per dr Pollyann Savoy at cancer center   Hypertension    Neuropathy    both thumbs   Obesity    Palpitations    PONV (postoperative nausea and vomiting)    Prediabetes    Preeclampsia 2006   Sleep apnea    no cpap used insurance would not cover, osa severe per pt   SOB (shortness of breath)    Swallowing difficulty     Past Surgical History:  Procedure Laterality Date   CESAREAN SECTION WITH BILATERAL TUBAL LIGATION  11/10/2004       DILATION AND CURETTAGE OF UTERUS  1997   IR IMAGING GUIDED PORT INSERTION  04/04/2019   OPERATIVE ULTRASOUND N/A 05/09/2019   Procedure: OPERATIVE ULTRASOUND;  Surgeon: Gery Pray, MD;  Location: [facility];  Service: Urology;  Laterality: N/A;   OPERATIVE ULTRASOUND N/A 05/15/2019   Procedure: OPERATIVE ULTRASOUND;  Surgeon: Gery Pray, MD;  Location[facility];  Service: Urology;  Laterality: N/A;   OPERATIVE ULTRASOUND N/A 05/22/2019   Procedure: OPERATIVE ULTRASOUND;  Surgeon: Gery Pray, MD;  Location: [facility];  Service: Urology;  Laterality: N/A;   OPERATIVE ULTRASOUND N/A 05/29/2019   Procedure: OPERATIVE ULTRASOUND;  Surgeon: Gery Pray, MD;  Location: [facility];  Service: Urology;  Laterality: N/A;   OPERATIVE ULTRASOUND N/A 06/05/2019   Procedure: OPERATIVE ULTRASOUND;  Surgeon: Gery Pray, MD;  Location: [facility];  Service: Urology;  Laterality: N/A;   TANDEM RING INSERTION N/A 05/09/2019   Procedure: TANDEM RING INSERTION;  Surgeon: Gery Pray, MD;  Location: [facility];  Service: Urology;  Laterality: N/A;   TANDEM RING INSERTION N/A 05/15/2019   Procedure: TANDEM RING INSERTION;   Surgeon: Gery Pray, MD;  Location: [facility];  Service: Urology;  Laterality: N/A;   TANDEM RING INSERTION N/A 05/22/2019   Procedure: TANDEM RING INSERTION;  Surgeon: Gery Pray, MD;  Location: [facility];  Service: Urology;  Laterality: N/A;   TANDEM RING INSERTION N/A 05/29/2019   Procedure: TANDEM RING INSERTION;  Surgeon: Gery Pray, MD;  Location: [facility];  Service: Urology;  Laterality: N/A;   TANDEM RING INSERTION N/A 06/05/2019   Procedure: TANDEM RING INSERTION;  Surgeon: Gery Pray, MD;  Location: [facility];  Service: Urology;  Laterality: N/A;    Family History  Problem Relation Age of Onset   Brain cancer Mother        lung cancer to brain   Hypertension Mother    Cancer Mother    Depression Mother    Anxiety disorder Mother    Diabetes Father    Kidney disease Father    Cancer Father        kidney cancer   Heart  failure Sister     Social History   Socioeconomic History   Marital status: Single    Spouse name: Not on file   Number of children: 4   Years of education: Not on file   Highest education level: Not on file  Occupational History   Not on file  Tobacco Use   Smoking status: Never   Smokeless tobacco: Never  Vaping Use   Vaping Use: Never used  Substance and Sexual Activity   Alcohol use: No   Drug use: No   Sexual activity: Not Currently    Birth control/protection: Surgical  Other Topics Concern   Not on file  Social History Narrative   Lives with her boyfriend   Social Determinants of Health   Financial Resource Strain: Not on file  Food Insecurity: Not on file  Transportation Needs: Not on file  Physical Activity: Not on file  Stress: Not on file  Social Connections: Not on file    Current Medications:  Current Outpatient Medications:    Accu-Chek Softclix Lancets lancets, Use to check blood sugar TID., Disp: 100 each, Rfl: 2   amLODipine-valsartan  (EXFORGE) 10-320 MG tablet, Take 1 tablet by mouth daily., Disp: 90 tablet, Rfl: 1   atorvastatin (LIPITOR) 10 MG tablet, Take 1 tablet (10 mg total) by mouth daily., Disp: 90 tablet, Rfl: 0   Blood Glucose Monitoring Suppl (ACCU-CHEK GUIDE) w/Device KIT, Use to check blood sugar TID., Disp: 1 kit, Rfl: 0   chlorhexidine (PERIDEX) 0.12 % solution, SMARTSIG:By Mouth, Disp: , Rfl:    Cholecalciferol (VITAMIN D3) 50 MCG (2000 UT) TABS, Take 2,000 Units by mouth daily., Disp: , Rfl:    CLONIDINE HCL PO, Take 0.1 mg by mouth in the morning and at bedtime. Pending BP readings, Disp: , Rfl:    fluticasone (FLONASE) 50 MCG/ACT nasal spray, Place 1 spray into both nostrils daily as needed for allergies or rhinitis., Disp: , Rfl:    glucose blood (ACCU-CHEK GUIDE) test strip, Use to check blood sugar TID., Disp: 100 each, Rfl: 2   Insulin Pen Needle (BD PEN NEEDLE NANO 2ND GEN) 32G X 4 MM MISC, 1 Package by Does not apply route 2 (two) times daily., Disp: 100 each, Rfl: 0   lidocaine-prilocaine (EMLA) cream, Apply 1 application topically as needed. Apply to port access daily as needed, Disp: 30 g, Rfl: 3   metFORMIN (GLUCOPHAGE-XR) 500 MG 24 hr tablet, Take 1 tablet (500 mg total) by mouth daily with breakfast., Disp: 90 tablet, Rfl: 1   Misc. Devices MISC, CMP with GFR, Disp: 1 ampule, Rfl: 0   mupirocin ointment (BACTROBAN) 2 %, 1 application 3 (three) times daily., Disp: , Rfl:    Semaglutide,0.25 or 0.5MG/DOS, (OZEMPIC, 0.25 OR 0.5 MG/DOSE,) 2 MG/1.5ML SOPN, Inject 0.5 mg into the skin once a week., Disp: 1.5 mL, Rfl: 0  Review of Systems: Denies appetite changes, fevers, chills, fatigue, unexplained weight changes. Denies hearing loss, neck lumps or masses, mouth sores, ringing in ears or voice changes. Denies cough or wheezing.  Denies shortness of breath. Denies chest pain or palpitations. Denies leg swelling. Denies abdominal distention, pain, blood in stools, constipation, diarrhea, nausea,  vomiting, or early satiety. Denies pain with intercourse, dysuria, frequency, hematuria or incontinence. Denies hot flashes or vaginal discharge.   Denies joint pain, back pain or muscle pain/cramps. Denies itching, rash, or wounds. Denies dizziness, headaches or seizures. Denies swollen lymph nodes or glands, denies easy bruising or bleeding. Denies anxiety, depression,  confusion, or decreased concentration.  Physical Exam: BP (!) 153/87 (BP Location: Left Arm, Patient Position: Sitting)   Pulse (!) 109   Temp 99 F (37.2 C) (Tympanic)   Resp 18   Ht 5' 2"  (1.575 m)   Wt 222 lb (100.7 kg)   SpO2 97%   BMI 40.60 kg/m  General: Alert, oriented, no acute distress. HEENT: Normocephalic, atraumatic, sclera anicteric. Chest: Clear to auscultation bilaterally.  No wheezes or rhonchi.  Port site clean.  Cardiovascular: Regular rate and rhythm, no murmurs. Abdomen: Obese, soft, nontender.  Normoactive bowel sounds.  No masses or hepatosplenomegaly appreciated.  Extremities: Grossly normal range of motion.  Warm, well perfused.  No edema bilaterally. Lymphatics: No cervical, supraclavicular, or inguinal adenopathy. GU: Normal appearing external genitalia without erythema, excoriation, or lesions.  Speculum exam reveals moderately atrophic vaginal mucosa, no lesions or masses, no bleeding or discharge.  No atypical vascularity.  Bimanual exam reveals no nodularity or masses. Rectovaginal exam confirms these findings.  Laboratory & Radiologic Studies: None new  Assessment & Plan: Tamara Osborne is a 46 y.o. woman with Stage IIB SCC who presents for surveillance.  Completed adjuvant chemoradiation in April 2021.   Patient remains NED.  Pap test and HPV testing was performed today.   Patient had significant improvement in her urinary and pelvic floor symptoms after working with physical therapy.  She is now having significant anxiety as well as pain related to intercourse.  We discussed  some things to try, and I offered rereferral to pelvic floor physical therapy.  She was very interested in this.  She is now more than 2 years out from completion of adjuvant treatment, we will transition to visits alternating between our office and radiation oncology every 6 months.  We reviewed signs and symptoms that should prompt a phone call before her next scheduled visit.  I have asked her to call back after the new year to get a visit scheduled with me 6 months after her visit with Dr. Sondra Come in October.  She is now ready to get her port out.  I have placed an order for this.  26 minutes of total time was spent for this patient encounter, including preparation, face-to-face counseling with the patient and coordination of care, and documentation of the encounter.  Jeral Pinch, MD  Division of Gynecologic Oncology  Department of Obstetrics and Gynecology  [facility]

## 2021-09-04 ENCOUNTER — Encounter (INDEPENDENT_AMBULATORY_CARE_PROVIDER_SITE_OTHER): Payer: Self-pay | Admitting: Family Medicine

## 2021-09-04 ENCOUNTER — Ambulatory Visit (INDEPENDENT_AMBULATORY_CARE_PROVIDER_SITE_OTHER): Payer: Medicaid Other | Admitting: Family Medicine

## 2021-09-04 VITALS — BP 119/83 | HR 78 | Temp 98.0°F | Ht 62.0 in | Wt 218.0 lb

## 2021-09-04 DIAGNOSIS — E785 Hyperlipidemia, unspecified: Secondary | ICD-10-CM

## 2021-09-04 DIAGNOSIS — E669 Obesity, unspecified: Secondary | ICD-10-CM

## 2021-09-04 DIAGNOSIS — E1165 Type 2 diabetes mellitus with hyperglycemia: Secondary | ICD-10-CM

## 2021-09-04 DIAGNOSIS — E1169 Type 2 diabetes mellitus with other specified complication: Secondary | ICD-10-CM | POA: Diagnosis not present

## 2021-09-04 DIAGNOSIS — Z6841 Body Mass Index (BMI) 40.0 and over, adult: Secondary | ICD-10-CM

## 2021-09-04 DIAGNOSIS — Z6839 Body mass index (BMI) 39.0-39.9, adult: Secondary | ICD-10-CM

## 2021-09-04 DIAGNOSIS — Z7985 Long-term (current) use of injectable non-insulin antidiabetic drugs: Secondary | ICD-10-CM

## 2021-09-04 MED ORDER — OZEMPIC (0.25 OR 0.5 MG/DOSE) 2 MG/1.5ML ~~LOC~~ SOPN: 0.5000 mg | PEN_INJECTOR | SUBCUTANEOUS | 0 refills | Status: DC

## 2021-09-08 NOTE — Progress Notes (Signed)
Chief Complaint:   OBESITY Tamara Osborne is here to discuss her progress with her obesity treatment plan along with follow-up of her obesity related diagnoses. Tamara Osborne is on the Category 3 Plan and states she is following her eating plan approximately 85% of the time. Tamara Osborne states she is swimming and walking her dog 60 minutes 1-7 times per week.  Today's visit was #: 1 Starting weight: 208 lbs Starting date: 09/20/2019 Today's weight: 218 lbs Today's date: 09/04/2021 Total lbs lost to date: 0 lbs Total lbs lost since last in-office visit: 2  Interim History: Tamara Osborne went to the beach for a few days and did indulge in some of her food choices. She is moving more with here new dog. Walking more at [employer] and doing activities with cancer group. Aside from her beach trip, she has been following meal plan fairly strictly. She has been eating a whole bag of broccoli frequently. She has a busy few weeks coming up.  Subjective:   1. Type 2 diabetes mellitus with hyperglycemia, without long-term current use of insulin (HCC) Tamara Osborne is currently on Ozempic 0.5 mg SubQ weekly. Occassionally GI side effects.  2. Hyperlipidemia associated with type 2 diabetes mellitus (Tamara Osborne) Tamara Osborne's last LDL of 158, HDL of 37, Trigly 130. She is currently taking Lipitor 10 mg daily.  Assessment/Plan:   1. Type 2 diabetes mellitus with hyperglycemia, without long-term current use of insulin (HCC) We will refill Ozempic 0.5 mg SubQ weekly with 0 refills. Stay at same dose to ensure patient getting all food in.   -Refill Semaglutide,0.25 or 0.'5MG'$ /DOS, (OZEMPIC, 0.25 OR 0.5 MG/DOSE,) 2 MG/1.5ML SOPN; Inject 0.5 mg into the skin once a week.  Dispense: 3 mL; Refill: 0  2. Hyperlipidemia associated with type 2 diabetes mellitus (Tamara Osborne) Tamara Osborne will continue taking Lipitor with no changes in dose; not at goal; Will repeat labs in 2 months.  3. Obesity with current BMI of 40.0 Tamara Osborne is currently in  the action stage of change. As such, her goal is to continue with weight loss efforts. She has agreed to the Category 3 Plan.   Exercise goals: All adults should avoid inactivity. Some physical activity is better than none, and adults who participate in any amount of physical activity gain some health benefits.  Behavioral modification strategies: increasing lean protein intake, meal planning and cooking strategies, keeping healthy foods in the home, and planning for success.  Tamara Osborne has agreed to follow-up with our clinic in 4 weeks. She was informed of the importance of frequent follow-up visits to maximize her success with intensive lifestyle modifications for her multiple health conditions.   Objective:   Blood pressure 119/83, pulse 78, temperature 98 F (36.7 C), height '5\' 2"'$  (1.575 m), weight 218 lb (98.9 kg), SpO2 96 %. Body mass index is 39.87 kg/m.  General: Cooperative, alert, well developed, in no acute distress. HEENT: Conjunctivae and lids unremarkable. Cardiovascular: Regular rhythm.  Lungs: Normal work of breathing. Neurologic: No focal deficits.   Lab Results  Component Value Date   CREATININE 1.47 (H) 06/05/2021   BUN 28 (H) 06/05/2021   NA 137 06/05/2021   K 4.4 06/05/2021   CL 105 06/05/2021   CO2 26 06/05/2021   Lab Results  Component Value Date   ALT 34 06/05/2021   AST 19 06/05/2021   ALKPHOS 93 06/05/2021   BILITOT 0.5 06/05/2021   Lab Results  Component Value Date   HGBA1C 5.4 06/05/2021   HGBA1C 5.2 01/13/2021  HGBA1C 7.5 (H) 10/18/2020   HGBA1C 6.0 (H) 05/20/2020   HGBA1C 5.2 10/30/2019   Lab Results  Component Value Date   INSULIN 40.3 (H) 09/20/2019   Lab Results  Component Value Date   TSH 1.910 09/20/2019   Lab Results  Component Value Date   CHOL 221 (H) 06/05/2021   HDL 37 (L) 06/05/2021   LDLCALC 158 (H) 06/05/2021   TRIG 130 06/05/2021   CHOLHDL 6.0 06/05/2021   Lab Results  Component Value Date   VD25OH 66.26  06/05/2021   VD25OH 59.37 10/18/2020   VD25OH 45.60 05/20/2020   Lab Results  Component Value Date   WBC 8.5 06/05/2021   HGB 13.6 06/05/2021   HCT 38.9 06/05/2021   MCV 79.4 (L) 06/05/2021   PLT 300 06/05/2021   Lab Results  Component Value Date   IRON 17 (L) 03/27/2019   TIBC 283 03/27/2019   FERRITIN 32 03/27/2019   Attestation Statements:   Reviewed by clinician on day of visit: allergies, medications, problem list, medical history, surgical history, family history, social history, and previous encounter notes.  I, Elnora Morrison, RMA am acting as transcriptionist for Coralie Common, MD.  I have reviewed the above documentation for accuracy and completeness, and I agree with the above. - Coralie Common, MD

## 2021-09-12 ENCOUNTER — Telehealth: Payer: Self-pay | Admitting: *Deleted

## 2021-09-12 NOTE — Telephone Encounter (Signed)
Patient called for her pap smear results from 7/3. Explained that the results are not back, but our office will contact the cytology office for an update.   Per cytology office the pap smear was sent to pathology for review and had staffing issues.  MD and NP notified.   Spoke with the patient and explained the staffing issued. Also explained that the results should hopefully be back later today

## 2021-09-16 ENCOUNTER — Telehealth: Payer: Self-pay

## 2021-09-16 LAB — CYTOLOGY - PAP
Comment: NEGATIVE
Diagnosis: NEGATIVE
Diagnosis: REACTIVE
High risk HPV: NEGATIVE

## 2021-09-16 NOTE — Telephone Encounter (Signed)
Spoke with patient to ensure she saw her pap results on mychart along with Dr. Lulu Riding comments. Patient reports she did see it and she is very relieved, she is going to go ahead and have her port removed now. Instructed to call with any needs.

## 2021-09-16 NOTE — Telephone Encounter (Signed)
Spoke with Tamara Osborne in Cytology at [facility]. She reports that Dr. Alric Seton is currently working on this and it should be read by the end of the day. Tamara Osborne is going to call over to [facility] where the specimen is and follow up. She will then call our office to update [place].

## 2021-09-16 NOTE — Telephone Encounter (Signed)
Received call from patient following up on her pap smear results. "I don't see anything in my MyChart and I'm getting really anxious. I was in the office on 09/01/21 and it seems to be taking a really long time." Empathized with patient and let her know I would look into it and call her back by the end of the day today to follow up.

## 2021-09-25 ENCOUNTER — Other Ambulatory Visit: Payer: Self-pay

## 2021-09-25 ENCOUNTER — Inpatient Hospital Stay: Payer: Medicaid Other

## 2021-09-25 DIAGNOSIS — C539 Malignant neoplasm of cervix uteri, unspecified: Secondary | ICD-10-CM

## 2021-09-25 DIAGNOSIS — Z95828 Presence of other vascular implants and grafts: Secondary | ICD-10-CM

## 2021-09-25 DIAGNOSIS — Z8541 Personal history of malignant neoplasm of cervix uteri: Secondary | ICD-10-CM | POA: Diagnosis not present

## 2021-09-25 MED ORDER — HEPARIN SOD (PORK) LOCK FLUSH 100 UNIT/ML IV SOLN
500.0000 [IU] | Freq: Once | INTRAVENOUS | Status: AC
  Administered 2021-09-25: 500 [IU]

## 2021-09-25 MED ORDER — SODIUM CHLORIDE 0.9% FLUSH
10.0000 mL | Freq: Once | INTRAVENOUS | Status: AC
  Administered 2021-09-25: 10 mL

## 2021-10-06 NOTE — Therapy (Unsigned)
OUTPATIENT PHYSICAL THERAPY FEMALE PELVIC EVALUATION   Patient Name: Tamara Osborne MRN: [id number] DOB:1975-12-21, 46 y.o., female Today's Date: 10/07/2021   PT End of Session - 10/07/21 1444     Visit Number 1    Date for PT Re-Evaluation 12/30/21    Authorization Type [employer]    PT Start Time 1300    PT Stop Time 1025    PT Time Calculation (min) 75 min    Activity Tolerance Patient tolerated treatment well    Behavior During Therapy [facility] for tasks assessed/performed             Past Medical History:  Diagnosis Date   Anemia    Anxiety    Bartholin cyst    Cervical cancer ([place])    Chronic kidney disease    Constipation    Depression    Deviated septum    Diabetes mellitus without complication ([place])    Difficult intravenous access    used port for 05-09-2019 surgery   Fibroids    History of blood transfusion    2 units given 04-26-2019, has had total of 8 units since jan 2021   History of blood transfusion 1 unit given 05-30-19   iv fluids also given   History of kidney problems    History of radiation therapy 03/23/2019-05/04/2019   Cervical external beam   Dr Gery Pray   History of radiation therapy 05/09/2019-06/05/2019   vaginal brachytherapy   Dr Gery Pray   History of recent blood transfusion 05/16/2019   2 units given  per dr Pollyann Savoy at cancer center   Hypertension    Neuropathy    both thumbs   Obesity    Palpitations    PONV (postoperative nausea and vomiting)    Prediabetes    Preeclampsia 2006   Sleep apnea    no cpap used insurance would not cover, osa severe per pt   SOB (shortness of breath)    Swallowing difficulty    Past Surgical History:  Procedure Laterality Date   CESAREAN SECTION WITH BILATERAL TUBAL LIGATION  11/10/2004       DILATION AND CURETTAGE OF UTERUS  1997   IR IMAGING GUIDED PORT INSERTION  04/04/2019   OPERATIVE ULTRASOUND N/A 05/09/2019   Procedure: OPERATIVE ULTRASOUND;  Surgeon: Gery Pray, MD;  Location: [place];  Service: Urology;  Laterality: N/A;   OPERATIVE ULTRASOUND N/A 05/15/2019   Procedure: OPERATIVE ULTRASOUND;  Surgeon: Gery Pray, MD;  Location[facility];  Service: Urology;  Laterality: N/A;   OPERATIVE ULTRASOUND N/A 05/22/2019   Procedure: OPERATIVE ULTRASOUND;  Surgeon: Gery Pray, MD;  Location: [facility];  Service: Urology;  Laterality: N/A;   OPERATIVE ULTRASOUND N/A 05/29/2019   Procedure: OPERATIVE ULTRASOUND;  Surgeon: Gery Pray, MD;  Location: [facility];  Service: Urology;  Laterality: N/A;   OPERATIVE ULTRASOUND N/A 06/05/2019   Procedure: OPERATIVE ULTRASOUND;  Surgeon: Gery Pray, MD;  Location: [facility];  Service: Urology;  Laterality: N/A;   TANDEM RING INSERTION N/A 05/09/2019   Procedure: TANDEM RING INSERTION;  Surgeon: Gery Pray, MD;  Location: [facility];  Service: Urology;  Laterality: N/A;   TANDEM RING INSERTION N/A 05/15/2019   Procedure: TANDEM RING INSERTION;  Surgeon: Gery Pray, MD;  Location: [facility];  Service: Urology;  Laterality: N/A;   TANDEM RING INSERTION N/A 05/22/2019   Procedure: TANDEM RING INSERTION;  Surgeon: Gery Pray, MD;  Location: [place];  Service: Urology;  Laterality: N/A;   TANDEM RING INSERTION N/A 05/29/2019   Procedure: TANDEM RING INSERTION;  Surgeon: Gery Pray, MD;  Location: [facility];  Service: Urology;  Laterality: N/A;   TANDEM RING INSERTION N/A 06/05/2019   Procedure: TANDEM RING INSERTION;  Surgeon: Gery Pray, MD;  Location: [facility];  Service: Urology;  Laterality: N/A;   Patient Active Problem List   Diagnosis Date Noted   Type 2 diabetes mellitus with hyperglycemia, without long-term current use of insulin ([place]) 07/03/2021   Hyperlipidemia associated with type 2 diabetes mellitus ([place]) 07/03/2021   Type 2 diabetes mellitus without  complication, without long-term current use of insulin ([place]) 11/15/2020   Incontinence of urine in female 09/09/2020   Onychomycosis of toenail 07/31/2020   OSA (obstructive sleep apnea) 07/31/2020   Prediabetes 05/27/2020   Sinus congestion 04/16/2020   Port-A-Cath in place 01/22/2020   Other hyperlipidemia 01/10/2020   Vitamin D deficiency 01/10/2020   Insulin resistance 01/10/2020   Class 3 severe obesity with serious comorbidity and body mass index (BMI) of 40.0 to 44.9 in adult ([place]) 01/10/2020   Obesity, Class II, BMI 35-39.9 09/07/2019   Dehydration 05/30/2019   Peripheral neuropathy due to chemotherapy ([place]) 04/25/2019   Nausea without vomiting 04/18/2019   Diarrhea 04/04/2019   CKD (chronic kidney disease), symptom management only, stage 3 (moderate) ([place]) 03/30/2019   Generalized anxiety disorder 03/30/2019   Hypertension associated with type 2 diabetes mellitus ([place])    Anxiety 03/23/2019   Squamous cell carcinoma of cervix ([place]) 03/23/2019   Deficiency anemia 03/22/2019   Squamous cell carcinoma in situ (SCCIS) of cervix 03/22/2019    PCP: Kerin Perna, NP  REFERRING PROVIDER: [name], MD  REFERRING DIAG:  C53.9 (ICD-10-CM) - Malignant neoplasm of cervix, unspecified site ([place])  N94.10 (ICD-10-CM) - Dyspareunia in female    THERAPY DIAG:  Muscle weakness (generalized)  Pelvic pain  Stress incontinence of urine  Aftercare following surgery for neoplasm  Rationale for Evaluation and Treatment Rehabilitation  ONSET DATE: 07/07/2021  SUBJECTIVE:                                                                                                                                                                                           SUBJECTIVE STATEMENT: I have always had the pinching but 3 months ago when I would get to the bigger dilator will see blood. Pain with intercourse and using the larger dilator.   PAIN:  Are you having pain?  Yes NPRS scale: 8/10 Pain location: Vaginal  Pain type: aching,  burning, sharp, and tight Pain description: intermittent   Aggravating factors: penile penetration; using the larger dilator, vaginal exam Relieving factors: no vaginal penetration  PRECAUTIONS: Other: cancer  WEIGHT BEARING RESTRICTIONS No  FALLS:  Has patient fallen in last 6 months? No  LIVING ENVIRONMENT: Lives with: lives with their partner  OCCUPATION: part time  PLOF: Independent  PATIENT GOALS reduce pain with vaginal penetration  PERTINENT HISTORY:  Cervical cancer with radiation; diabetes, c-section   BOWEL MOVEMENT Pain with bowel movement: No  URINATION Pain with urination: No Fully empty bladder: Yes:   Stream:  average Urgency: Yes: some watches what she drinks when she goes for a car ride Frequency: can wait 2-3 hours Leakage: Coughing and Sneezing Pads: Yes: when in a car for a distance  INTERCOURSE Pain with intercourse: During Penetration, After Intercourse, and after gets a soreness Ability to have vaginal penetration:  Yes: not always comfortable to have the penis deeper Climax: not since her cancer Marinoff Scale: 2/3  PREGNANCY Vaginal deliveries 3 Tearing No C-section deliveries 1 Currently pregnant No    OBJECTIVE:   DIAGNOSTIC FINDINGS:  none  PATIENT SURVEYS:  PFIQ-7 62; UIQ-7 38, POPIQ-7 24  COGNITION:  Overall cognitive status: Within functional limits for tasks assessed     SENSATION:  Light touch: Appears intact  Proprioception: Appears intact  MUSCLE LENGTH: Hamstrings: Right decreased  deg; Left decreased deg Thomas test: Right positive deg; Left positive deg                POSTURE: rounded shoulders, forward head, decreased lumbar lordosis, and increased abdominal size   PELVIC ALIGNMENT: correct alignment  LUMBARAROM/PROM  A/PROM A/PROM  eval  Flexion full  Extension Decreased by 50%  Right lateral flexion Decreased by 25%  Left  lateral flexion Decreased by 25%  Right rotation Decreased by 25%  Left rotation Decreased by 25%   (Blank rows = not tested)  LOWER EXTREMITY ROM:  Active ROM Right eval Left eval  Hip extension 0 0   (Blank rows = not tested)  LOWER EXTREMITY MMT:  MMT Right eval Left eval  Hip extension 3+/5 3+/5  Hip abduction 3+/5 4/5    PALPATION:   General  tenderness suprapubically, bilaterally hip adductor by the groin,                 External Perineal Exam along the perineal body, ishiocavernosis, bulbocavernosus                             Internal Pelvic Floor along the introitus, levator ani, obturator internist, side of the bladder and urethra, and at the vaginal vault. Therapist touched the vaginal vault with her index finger that Is 4 inches long  Patient confirms identification and approves PT to assess internal pelvic floor and treatment Yes  PELVIC MMT:   MMT eval  Vaginal 2/5 but not hug of therapist finger  (Blank rows = not tested)        TONE: increased  PROLAPSE: None noted  TODAY'S TREATMENT  EVAL Date: 10/07/2021 HEP established-see below    PATIENT EDUCATION:  Education details: educated patient on vaginal moisturizers to use daily, uber lube for intercourse, ohnut to reduce the length of the penis and handle for her dilators  Person educated: Patient Education method: Explanation, Demonstration, Tactile cues, and Verbal cues Education comprehension: verbalized understanding   HOME EXERCISE PROGRAM: See above.   ASSESSMENT:  CLINICAL  IMPRESSION: Patient is a 46 y.o. female who was seen today for physical therapy evaluation and treatment for Dyspareunia. Patient had cervical cancer with radiation treatment and hysterectomy. Patient reports her pain level is 8/10 with vaginal penetration. She is presently using the dilators but unable to go further due to the pain and blood afterwards. Patient reports she feels dry in the vaginal area. She has pain  with a speculum when having a vaginal exam. She will have pain with penile penetration vaginally and afterwards. Patient has not been able to climax since her cervical cancer treatment. She has tenderness located in the levator ani, obturator internist, sides of the urethra and bladder, and top of the vaginal vault which is 4 inches upward and does not move well. She has tenderness externally on the suprapubic area, along the ischiocavernosus, perineal body and bulbocavernosus. Her pelvic floor strength is 2/5 without hugging the therapist finger and holds for 1-2 seconds. She will leak urine with cough and sneeze. She has to wear a pad when going for a long ride. Bilateral hip abduction and extension is 3+/5. She has decreased lumbar ROM by 25%. Bilateral hip extension actively is 0 degrees. Suggested to patient to get an Ohnut to reduce the length of the penis during penile penetration. Suggested for her to get a handle for her dilator to make it easier to insert. Patient will benefit from skilled therapy to reduce pelvic pain, improve tissue extensibility for the radiation and reduce urinary leakage.    OBJECTIVE IMPAIRMENTS decreased activity tolerance, decreased coordination, decreased endurance, decreased strength, increased fascial restrictions, increased muscle spasms, impaired flexibility, and pain.   ACTIVITY LIMITATIONS bending, standing, continence, toileting, and vaginal exam  PARTICIPATION LIMITATIONS: interpersonal relationship, shopping, and community activity  PERSONAL FACTORS Cervical cancer with radiation; diabetes, c-section are also affecting patient's functional outcome.   REHAB POTENTIAL: Good  CLINICAL DECISION MAKING: Evolving/moderate complexity  EVALUATION COMPLEXITY: Moderate   GOALS: Goals reviewed with patient? Yes  SHORT TERM GOALS: Target date: 11/04/2021  Patient understand how to use moisturizers to the vaginal canal to improve the quality of the tissue.   Baseline:not educated yet Goal status: INITIAL  2.  Patient educated on Ohnut to use with her dilator to make it easier to insert and relax with it in the canal.  Baseline: Not educated yet Goal status: INITIAL  3.  Instructed on how to use the handle for the dilator to place it in the vaginal canal.  Baseline: not educated yet Goal status: INITIAL  LONG TERM GOALS: Target date: 12/30/2021   Patient is independent with her advanced HEP for pelvic floor stretches, hip strength and pelvic floor coordination.  Baseline: Not educated yet Goal status: INITIAL  2.  Patient able to have vaginal penetration with pain level </= 2-3/10 due to improved mobility of the tissue and accommodations using a device or different positions.  Baseline: pain level 8/10 Goal status: INITIAL  3.  Patient able to use the largest dilator with pain </= 1-2/10 to continue to remodel the fibrotic tissue of the vaginal wall.  Baseline: pain level 8/10 and on the third dilator Goal status: INITIAL  4.  Urinary leakage decreased >/= 70% due to the ability to fully relax and fully contract the pelvic floor and strength >/= 3/5 Baseline: urinary leakage with coughing and sneezing and strength is 2/5 without hug of therapist finger.  Goal status: INITIAL   PLAN: PT FREQUENCY: 1x/week  PT DURATION: 12 weeks  PLANNED INTERVENTIONS:  Therapeutic exercises, Therapeutic activity, Neuromuscular re-education, Patient/Family education, Self Care, Joint mobilization, Dry Needling, Electrical stimulation, Spinal mobilization, Taping, Biofeedback, and Manual therapy  PLAN FOR NEXT SESSION: urogenital release, manual work to the thighs, go over stretches, see if she feeling with the vaginal moisturizer, internal work, manual work on lower abdomen    Earlie Counts, PT 10/07/21 3:17 PM

## 2021-10-07 ENCOUNTER — Encounter: Payer: Medicaid Other | Attending: Gynecologic Oncology | Admitting: Physical Therapy

## 2021-10-07 ENCOUNTER — Other Ambulatory Visit: Payer: Self-pay | Admitting: Gynecologic Oncology

## 2021-10-07 ENCOUNTER — Telehealth: Payer: Self-pay

## 2021-10-07 ENCOUNTER — Encounter: Payer: Self-pay | Admitting: Physical Therapy

## 2021-10-07 DIAGNOSIS — Z483 Aftercare following surgery for neoplasm: Secondary | ICD-10-CM | POA: Diagnosis present

## 2021-10-07 DIAGNOSIS — M6281 Muscle weakness (generalized): Secondary | ICD-10-CM | POA: Insufficient documentation

## 2021-10-07 DIAGNOSIS — C539 Malignant neoplasm of cervix uteri, unspecified: Secondary | ICD-10-CM | POA: Diagnosis not present

## 2021-10-07 DIAGNOSIS — N941 Unspecified dyspareunia: Secondary | ICD-10-CM | POA: Diagnosis not present

## 2021-10-07 DIAGNOSIS — R102 Pelvic and perineal pain: Secondary | ICD-10-CM | POA: Insufficient documentation

## 2021-10-07 DIAGNOSIS — N393 Stress incontinence (female) (male): Secondary | ICD-10-CM | POA: Insufficient documentation

## 2021-10-07 DIAGNOSIS — N952 Postmenopausal atrophic vaginitis: Secondary | ICD-10-CM

## 2021-10-07 MED ORDER — ESTROGENS CONJUGATED 0.625 MG/GM VA CREA: 1.0000 | TOPICAL_CREAM | VAGINAL | 1 refills | Status: DC

## 2021-10-07 NOTE — Telephone Encounter (Signed)
Pt is aware of [name] sending in vaginal estrogen at the recommendation of her pelvic floor physical therapist.

## 2021-10-08 ENCOUNTER — Encounter (INDEPENDENT_AMBULATORY_CARE_PROVIDER_SITE_OTHER): Payer: Self-pay

## 2021-10-09 ENCOUNTER — Encounter (INDEPENDENT_AMBULATORY_CARE_PROVIDER_SITE_OTHER): Payer: Self-pay | Admitting: Family Medicine

## 2021-10-09 ENCOUNTER — Ambulatory Visit (INDEPENDENT_AMBULATORY_CARE_PROVIDER_SITE_OTHER): Payer: Medicaid Other | Admitting: Family Medicine

## 2021-10-09 VITALS — BP 138/86 | HR 88 | Temp 98.2°F | Ht 62.0 in | Wt 220.0 lb

## 2021-10-09 DIAGNOSIS — E1165 Type 2 diabetes mellitus with hyperglycemia: Secondary | ICD-10-CM | POA: Diagnosis not present

## 2021-10-09 DIAGNOSIS — E1169 Type 2 diabetes mellitus with other specified complication: Secondary | ICD-10-CM

## 2021-10-09 DIAGNOSIS — E669 Obesity, unspecified: Secondary | ICD-10-CM

## 2021-10-09 DIAGNOSIS — E559 Vitamin D deficiency, unspecified: Secondary | ICD-10-CM

## 2021-10-09 DIAGNOSIS — Z7985 Long-term (current) use of injectable non-insulin antidiabetic drugs: Secondary | ICD-10-CM

## 2021-10-09 DIAGNOSIS — E785 Hyperlipidemia, unspecified: Secondary | ICD-10-CM | POA: Diagnosis not present

## 2021-10-09 DIAGNOSIS — Z6841 Body Mass Index (BMI) 40.0 and over, adult: Secondary | ICD-10-CM

## 2021-10-09 MED ORDER — ATORVASTATIN CALCIUM 10 MG PO TABS: 10.0000 mg | ORAL_TABLET | Freq: Every day | ORAL | 0 refills | Status: DC

## 2021-10-09 MED ORDER — OZEMPIC (0.25 OR 0.5 MG/DOSE) 2 MG/1.5ML ~~LOC~~ SOPN
0.5000 mg | PEN_INJECTOR | SUBCUTANEOUS | 0 refills | Status: DC
Start: 2021-10-09 — End: 2021-11-10

## 2021-10-14 ENCOUNTER — Encounter: Payer: Self-pay | Admitting: Physical Therapy

## 2021-10-14 ENCOUNTER — Encounter: Payer: Medicaid Other | Admitting: Physical Therapy

## 2021-10-14 DIAGNOSIS — R102 Pelvic and perineal pain: Secondary | ICD-10-CM

## 2021-10-14 DIAGNOSIS — M6281 Muscle weakness (generalized): Secondary | ICD-10-CM | POA: Diagnosis not present

## 2021-10-14 NOTE — Therapy (Signed)
OUTPATIENT PHYSICAL THERAPY TREATMENT NOTE   Patient Name: Tamara Osborne MRN: [id number] DOB:20-Nov-1975, 46 y.o., female Today's Date: 10/14/2021  PCP: Kerin Perna, NP REFERRING PROVIDER: [name], MD  END OF SESSION:   PT End of Session - 10/14/21 1034     Visit Number 2    Date for PT Re-Evaluation 12/30/21    Authorization Type [employer]    Authorization Time Period 10/07/2021-12/05/2021    Authorization - Visit Number 2    Authorization - Number of Visits 7    PT Start Time 9381    PT Stop Time 1115    PT Time Calculation (min) 45 min    Activity Tolerance Patient tolerated treatment well    Behavior During Therapy WFL for tasks assessed/performed             Past Medical History:  Diagnosis Date   Anemia    Anxiety    Bartholin cyst    Cervical cancer ([place])    Chronic kidney disease    Constipation    Depression    Deviated septum    Diabetes mellitus without complication ([place])    Difficult intravenous access    used port for 05-09-2019 surgery   Fibroids    History of blood transfusion    2 units given 04-26-2019, has had total of 8 units since jan 2021   History of blood transfusion 1 unit given 05-30-19   iv fluids also given   History of kidney problems    History of radiation therapy 03/23/2019-05/04/2019   Cervical external beam   Dr Gery Pray   History of radiation therapy 05/09/2019-06/05/2019   vaginal brachytherapy   Dr Gery Pray   History of recent blood transfusion 05/16/2019   2 units given  per dr Pollyann Savoy at cancer center   Hypertension    Neuropathy    both thumbs   Obesity    Palpitations    PONV (postoperative nausea and vomiting)    Prediabetes    Preeclampsia 2006   Sleep apnea    no cpap used insurance would not cover, osa severe per pt   SOB (shortness of breath)    Swallowing difficulty    Past Surgical History:  Procedure Laterality Date   CESAREAN SECTION WITH BILATERAL TUBAL LIGATION  11/10/2004        DILATION AND CURETTAGE OF UTERUS  1997   IR IMAGING GUIDED PORT INSERTION  04/04/2019   OPERATIVE ULTRASOUND N/A 05/09/2019   Procedure: OPERATIVE ULTRASOUND;  Surgeon: Gery Pray, MD;  Location: [facility];  Service: Urology;  Laterality: N/A;   OPERATIVE ULTRASOUND N/A 05/15/2019   Procedure: OPERATIVE ULTRASOUND;  Surgeon: Gery Pray, MD;  Location: [facility];  Service: Urology;  Laterality: N/A;   OPERATIVE ULTRASOUND N/A 05/22/2019   Procedure: OPERATIVE ULTRASOUND;  Surgeon: Gery Pray, MD;  Location: [facility];  Service: Urology;  Laterality: N/A;   OPERATIVE ULTRASOUND N/A 05/29/2019   Procedure: OPERATIVE ULTRASOUND;  Surgeon: Gery Pray, MD;  Location: [facility];  Service: Urology;  Laterality: N/A;   OPERATIVE ULTRASOUND N/A 06/05/2019   Procedure: OPERATIVE ULTRASOUND;  Surgeon: Gery Pray, MD;  Location: [facility];  Service: Urology;  Laterality: N/A;   TANDEM RING INSERTION N/A 05/09/2019   Procedure: TANDEM RING INSERTION;  Surgeon: Gery Pray, MD;  Location: [facility];  Service: Urology;  Laterality: N/A;   TANDEM RING INSERTION N/A 05/15/2019  Procedure: TANDEM RING INSERTION;  Surgeon: Gery Pray, MD;  Location: [facility];  Service: Urology;  Laterality: N/A;   TANDEM RING INSERTION N/A 05/22/2019   Procedure: TANDEM RING INSERTION;  Surgeon: Gery Pray, MD;  Location: [facility];  Service: Urology;  Laterality: N/A;   TANDEM RING INSERTION N/A 05/29/2019   Procedure: TANDEM RING INSERTION;  Surgeon: Gery Pray, MD;  Location: [facility];  Service: Urology;  Laterality: N/A;   TANDEM RING INSERTION N/A 06/05/2019   Procedure: TANDEM RING INSERTION;  Surgeon: Gery Pray, MD;  Location: [facility];  Service: Urology;  Laterality: N/A;   Patient Active Problem List   Diagnosis Date Noted    Type 2 diabetes mellitus with hyperglycemia, without long-term current use of insulin ([place]) 07/03/2021   Hyperlipidemia associated with type 2 diabetes mellitus ([place]) 07/03/2021   Type 2 diabetes mellitus without complication, without long-term current use of insulin ([place]) 11/15/2020   Incontinence of urine in female 09/09/2020   Onychomycosis of toenail 07/31/2020   OSA (obstructive sleep apnea) 07/31/2020   Prediabetes 05/27/2020   Sinus congestion 04/16/2020   Port-A-Cath in place 01/22/2020   Other hyperlipidemia 01/10/2020   Vitamin D deficiency 01/10/2020   Insulin resistance 01/10/2020   Class 3 severe obesity with serious comorbidity and body mass index (BMI) of 40.0 to 44.9 in adult ([place]) 01/10/2020   Obesity, Class II, BMI 35-39.9 09/07/2019   Dehydration 05/30/2019   Peripheral neuropathy due to chemotherapy ([place]) 04/25/2019   Nausea without vomiting 04/18/2019   Diarrhea 04/04/2019   CKD (chronic kidney disease), symptom management only, stage 3 (moderate) ([place]) 03/30/2019   Generalized anxiety disorder 03/30/2019   Hypertension associated with type 2 diabetes mellitus ([place])    Anxiety 03/23/2019   Squamous cell carcinoma of cervix ([place]) 03/23/2019   Deficiency anemia 03/22/2019   Squamous cell carcinoma in situ (SCCIS) of cervix 03/22/2019   REFERRING DIAG:  C53.9 (ICD-10-CM) - Malignant neoplasm of cervix, unspecified site ([place])  N94.10 (ICD-10-CM) - Dyspareunia in female      THERAPY DIAG:  Muscle weakness (generalized)   Pelvic pain   Stress incontinence of urine   Aftercare following surgery for neoplasm   Rationale for Evaluation and Treatment Rehabilitation   ONSET DATE: 07/07/2021   SUBJECTIVE:                                                                                                                                                                                            SUBJECTIVE STATEMENT: I picked up the estrace. The moisturizer cream is  helping.   PAIN:  Are you having pain? Yes NPRS scale: 8/10 Pain location: Vaginal   Pain type: aching, burning, sharp, and tight Pain description: intermittent    Aggravating factors: penile penetration; using the larger dilator, vaginal exam Relieving factors: no vaginal penetration   PRECAUTIONS: Other: cancer   WEIGHT BEARING RESTRICTIONS No   FALLS:  Has patient fallen in last 6 months? No   LIVING ENVIRONMENT: Lives with: lives with their partner   OCCUPATION: part time   PLOF: Independent   PATIENT GOALS reduce pain with vaginal penetration   PERTINENT HISTORY:  Cervical cancer with radiation; diabetes, c-section     BOWEL MOVEMENT Pain with bowel movement: No   URINATION Pain with urination: No Fully empty bladder: Yes:   Stream:  average Urgency: Yes: some watches what she drinks when she goes for a car ride Frequency: can wait 2-3 hours Leakage: Coughing and Sneezing Pads: Yes: when in a car for a distance   INTERCOURSE Pain with intercourse: During Penetration, After Intercourse, and after gets a soreness Ability to have vaginal penetration:  Yes: not always comfortable to have the penis deeper Climax: not since her cancer Marinoff Scale: 2/3   PREGNANCY Vaginal deliveries 3 Tearing No C-section deliveries 1 Currently pregnant No       OBJECTIVE:    DIAGNOSTIC FINDINGS:  none   PATIENT SURVEYS:  PFIQ-7 62; UIQ-7 38, POPIQ-7 24   COGNITION:            Overall cognitive status: Within functional limits for tasks assessed                          SENSATION:            Light touch: Appears intact            Proprioception: Appears intact   MUSCLE LENGTH: Hamstrings: Right decreased  deg; Left decreased deg Thomas test: Right positive deg; Left positive deg                  POSTURE: rounded shoulders, forward head, decreased lumbar lordosis, and increased abdominal size               PELVIC ALIGNMENT: correct alignment    LUMBARAROM/PROM   A/PROM A/PROM  eval  Flexion full  Extension Decreased by 50%  Right lateral flexion Decreased by 25%  Left lateral flexion Decreased by 25%  Right rotation Decreased by 25%  Left rotation Decreased by 25%   (Blank rows = not tested)   LOWER EXTREMITY ROM:   Active ROM Right eval Left eval  Hip extension 0 0   (Blank rows = not tested)   LOWER EXTREMITY MMT:   MMT Right eval Left eval  Hip extension 3+/5 3+/5  Hip abduction 3+/5 4/5     PALPATION:   General  tenderness suprapubically, bilaterally hip adductor by the groin,                  External Perineal Exam along the perineal body, ishiocavernosis, bulbocavernosus                             Internal Pelvic Floor along the introitus, levator ani, obturator internist, side of the bladder and urethra, and at the vaginal vault. Therapist touched the vaginal vault with her index finger that Is 4 inches long   Patient confirms identification and approves PT to assess internal pelvic  floor and treatment Yes   PELVIC MMT:   MMT eval  Vaginal 2/5 but not hug of therapist finger  (Blank rows = not tested)         TONE: increased   PROLAPSE: None noted   TODAY'S TREATMENT  10/14/2021 Manual: Soft tissue mobilization:to the lower abdomen and suprapubic working on the release of the tissue.  Scar tissue mobilization: to the c-section scar to improve mobility.  Myofascial release:urogenital diaphragm release Internal pelvic floor techniques:No emotional/communication barriers or cognitive limitation. Patient is motivated to learn. Patient understands and agrees with treatment goals and plan. PT explains patient will be examined in standing, sitting, and lying down to see how their muscles and joints work. When they are ready, they will be asked to remove their underwear so PT can examine their perineum. The patient is also given the option of providing their own chaperone as one is not provided in  our facility. The patient also has the right and is explained the right to defer or refuse any part of the evaluation or treatment including the internal exam. With the patient's consent, PT will use one gloved finger to gently assess the muscles of the pelvic floor, seeing how well it contracts and relaxes and if there is muscle symmetry. After, the patient will get dressed and PT and patient will discuss exam findings and plan of care. PT and patient discuss plan of care, schedule, attendance policy and HEP activities.    Manual work to the Animal nutritionist. Monitoring for pain.  Exercises: Stretches/mobility:hamstring stretch with band 30 sec. Bil.     ITB stretch with band holding for 30 sec bil.     Happy baby one leg at a time holding for 30 sec    Hip flexor stretch in supine with therapist assistance holding 30 sec bil.     Piriformis stretch holding for 30 sec bil.  Therapeutic activities: Functional strengthening activities: Self-care: Discussed with patient on the ouh nut Talked to patient on how to apply the estrace cream and how often.     EVAL Date: 10/07/2021 HEP established-see below     PATIENT EDUCATION: 10/14/2021 Education details: Access Code: [email or web address] Person educated: Patient Education method: Explanation, Demonstration, Tactile cues, Verbal cues, and Handouts Education comprehension: verbalized understanding, returned demonstration, verbal cues required, tactile cues required, and needs further education     HOME EXERCISE PROGRAM: 10/14/2021 Access Code: [email or web address] URL: [email or web address] Date: 10/14/2021 Prepared by: Earlie Counts  Program Notes use the estrace cream  into the vaginal canal 2 times per week. place it in the canal with the applicator then use your finger to rub it in and the extra rub into the vulvar are. Use the good clean love daily on the vulva area.   Exercises - Hooklying Hamstring Stretch with Strap  - 1 x  daily - 7 x weekly - 1 sets - 2 reps - 30 sec hold - Supine ITB Stretch with Strap  - 1 x daily - 7 x weekly - 1 sets - 2 reps - 30 sec hold - Supine Piriformis Stretch Pulling Heel to Hip  - 1 x daily - 7 x weekly - 1 sets - 2 reps - 30 sec hold   ASSESSMENT:   CLINICAL IMPRESSION: Patient is a 46 y.o. female who was seen today for physical therapy  treatment for Dyspareunia.  She understands how to use the estrace cream and vaginal moisturizer to help with the dryness of the  vaginal tissue. Patient had increased mobility of the lower abdominal tissue. Patient will benefit from skilled therapy to reduce pelvic pain, improve tissue extensibility for the radiation and reduce urinary leakage.      OBJECTIVE IMPAIRMENTS decreased activity tolerance, decreased coordination, decreased endurance, decreased strength, increased fascial restrictions, increased muscle spasms, impaired flexibility, and pain.    ACTIVITY LIMITATIONS bending, standing, continence, toileting, and vaginal exam   PARTICIPATION LIMITATIONS: interpersonal relationship, shopping, and community activity   PERSONAL FACTORS Cervical cancer with radiation; diabetes, c-section are also affecting patient's functional outcome.    REHAB POTENTIAL: Good   CLINICAL DECISION MAKING: Evolving/moderate complexity   EVALUATION COMPLEXITY: Moderate     GOALS: Goals reviewed with patient? Yes   SHORT TERM GOALS: Target date: 11/04/2021   Patient understand how to use moisturizers to the vaginal canal to improve the quality of the tissue.  Baseline:not educated yet Goal status: met 10/14/2021  2.  Patient educated on [place] to use with her dilator to make it easier to insert and relax with it in the canal.  Baseline: Not educated yet Goal status: INITIAL   3.  Instructed on how to use the handle for the dilator to place it in the vaginal canal.  Baseline: not educated yet Goal status: INITIAL   LONG TERM GOALS: Target date:  12/30/2021    Patient is independent with her advanced HEP for pelvic floor stretches, hip strength and pelvic floor coordination.  Baseline: Not educated yet Goal status: INITIAL   2.  Patient able to have vaginal penetration with pain level </= 2-3/10 due to improved mobility of the tissue and accommodations using a device or different positions.  Baseline: pain level 8/10 Goal status: INITIAL   3.  Patient able to use the largest dilator with pain </= 1-2/10 to continue to remodel the fibrotic tissue of the vaginal wall.  Baseline: pain level 8/10 and on the third dilator Goal status: INITIAL   4.  Urinary leakage decreased >/= 70% due to the ability to fully relax and fully contract the pelvic floor and strength >/= 3/5 Baseline: urinary leakage with coughing and sneezing and strength is 2/5 without hug of therapist finger.  Goal status: INITIAL     PLAN: PT FREQUENCY: 1x/week   PT DURATION: 12 weeks   PLANNED INTERVENTIONS: Therapeutic exercises, Therapeutic activity, Neuromuscular re-education, Patient/Family education, Self Care, Joint mobilization, Dry Needling, Electrical stimulation, Spinal mobilization, Taping, Biofeedback, and Manual therapy   PLAN FOR NEXT SESSION: urogenital release, manual work to the thighs, internal work    Liz Claiborne, PT 10/14/21 11:29 AM

## 2021-10-16 ENCOUNTER — Other Ambulatory Visit (INDEPENDENT_AMBULATORY_CARE_PROVIDER_SITE_OTHER): Payer: Self-pay | Admitting: Primary Care

## 2021-10-16 DIAGNOSIS — I10 Essential (primary) hypertension: Secondary | ICD-10-CM

## 2021-10-16 NOTE — Telephone Encounter (Signed)
Medication Refill - Medication: amLODipine-valsartan (EXFORGE) 10-320 MG tablet. Patient had to cancel her upcoming appointment because she is having surgery. Patient will run and would like PCP to send in a short supply to hold her over until her 11/05/2021 appointment  Has the patient contacted their pharmacy? Yes.    (Agent: If yes, when and what did the pharmacy advise?) Contact PCP office   Preferred Pharmacy (with phone number or street name):  [facility] [phone] - [place], [address] Phone:  [phone]  Fax:  [phone]      Has the patient been seen for an appointment in the last year OR does the patient have an upcoming appointment? Yes.    Agent: Please be advised that RX refills may take up to 3 business days. We ask that you follow-up with your pharmacy.

## 2021-10-16 NOTE — Progress Notes (Signed)
Chief Complaint:   OBESITY Tamara Osborne is here to discuss her progress with her obesity treatment plan along with follow-up of her obesity related diagnoses. Tamara Osborne is on the Category 3 Plan and states she is following her eating plan approximately 90% of the time. Tamara Osborne states she is doing classes 20 minutes 1 times per week.  Today's visit was #: 29 Starting weight: 208 lbs Starting date: 09/20/2019 Today's weight: 220 lbs Today's date: 10/09/2021 Total lbs lost to date: 0 lbs Total lbs lost since last in-office visit: 0  Interim History: Tamara Osborne felt she did really well except for the days that it was very hot and could not get all meat in. She did go to the beach and really celebrated with indulge food and drink due to biopsy being negative. She does recognize she needs to focus a bit more on protein intake. She has not exercised the same amount in the past month.  Subjective:   1. Hyperlipidemia associated with type 2 diabetes mellitus ([place]) Tamara Osborne is currently taking Atorvastatin 10 mg. LFT's in April 2023 was within normal limits.  2. Type 2 diabetes mellitus with hyperglycemia, without long-term current use of insulin ([facility]) [name] is currently taking Ozempic 0.5 mg weekly. Denies GI side effects but some satiety noting.   3. Vitamin D deficiency Tamara Osborne is currently taking over the counter Vit D. Denies any nausea, vomiting or muscle weakness. She notes fatigue.  Assessment/Plan:   1. Hyperlipidemia associated with type 2 diabetes mellitus ([place]) We will obtain labs today. We will refill Atorvastatin 10 mg by mouth daily for 1 month with 0 refills.  -Refill atorvastatin (LIPITOR) 10 MG tablet; Take 1 tablet (10 mg total) by mouth daily.  Dispense: 90 tablet; Refill: 0  - Lipid Panel With LDL/HDL Ratio  2. Type 2 diabetes mellitus with hyperglycemia, without long-term current use of insulin ([facility]) We will obtain labs today. We will refill Ozempic 0.5 mg SubQ  once weekly for 1 month with 0 refills.  -Refill Semaglutide,0.25 or 0.'5MG'$ /DOS, (OZEMPIC, 0.25 OR 0.5 MG/DOSE,) 2 MG/1.5ML SOPN; Inject 0.5 mg into the skin once a week.  Dispense: 3 mL; Refill: 0  - Comprehensive metabolic panel - Hemoglobin A1c - Insulin, random  3. Vitamin D deficiency We will obtain labs today.  - VITAMIN D 25 Hydroxy (Vit-D Deficiency, Fractures)  4. Obesity with current BMI of 40.3 Tamara Osborne is currently in the action stage of change. As such, her goal is to continue with weight loss efforts. She has agreed to the Category 3 Plan.   Exercise goals: All adults should avoid inactivity. Some physical activity is better than none, and adults who participate in any amount of physical activity gain some health benefits.  Behavioral modification strategies: increasing lean protein intake, meal planning and cooking strategies, keeping healthy foods in the home, and planning for success.  Tamara Osborne has agreed to follow-up with our clinic in 4 weeks. She was informed of the importance of frequent follow-up visits to maximize her success with intensive lifestyle modifications for her multiple health conditions.   Tamara Osborne was informed we would discuss her lab results at her next visit unless there is a critical issue that needs to be addressed sooner. Tamara Osborne agreed to keep her next visit at the agreed upon time to discuss these results.  Objective:   Blood pressure 138/86, pulse 88, temperature 98.2 F (36.8 C), height '5\' 2"'$  (1.575 m), weight 220 lb (99.8 kg), SpO2 97 %. Body mass index  is 40.24 kg/m.  General: Cooperative, alert, well developed, in no acute distress. HEENT: Conjunctivae and lids unremarkable. Cardiovascular: Regular rhythm.  Lungs: Normal work of breathing. Neurologic: No focal deficits.   Lab Results  Component Value Date   CREATININE 1.47 (H) 06/05/2021   BUN 28 (H) 06/05/2021   NA 137 06/05/2021   K 4.4 06/05/2021   CL 105 06/05/2021   CO2  26 06/05/2021   Lab Results  Component Value Date   ALT 34 06/05/2021   AST 19 06/05/2021   ALKPHOS 93 06/05/2021   BILITOT 0.5 06/05/2021   Lab Results  Component Value Date   HGBA1C 5.4 06/05/2021   HGBA1C 5.2 01/13/2021   HGBA1C 7.5 (H) 10/18/2020   HGBA1C 6.0 (H) 05/20/2020   HGBA1C 5.2 10/30/2019   Lab Results  Component Value Date   INSULIN 40.3 (H) 09/20/2019   Lab Results  Component Value Date   TSH 1.910 09/20/2019   Lab Results  Component Value Date   CHOL 221 (H) 06/05/2021   HDL 37 (L) 06/05/2021   LDLCALC 158 (H) 06/05/2021   TRIG 130 06/05/2021   CHOLHDL 6.0 06/05/2021   Lab Results  Component Value Date   VD25OH 66.26 06/05/2021   VD25OH 59.37 10/18/2020   VD25OH 45.60 05/20/2020   Lab Results  Component Value Date   WBC 8.5 06/05/2021   HGB 13.6 06/05/2021   HCT 38.9 06/05/2021   MCV 79.4 (L) 06/05/2021   PLT 300 06/05/2021   Lab Results  Component Value Date   IRON 17 (L) 03/27/2019   TIBC 283 03/27/2019   FERRITIN 32 03/27/2019   Attestation Statements:   Reviewed by clinician on day of visit: allergies, medications, problem list, medical history, surgical history, family history, social history, and previous encounter notes.  I, Elnora Morrison, RMA am acting as transcriptionist for Coralie Common, MD.  I have reviewed the above documentation for accuracy and completeness, and I agree with the above. - Coralie Common, MD

## 2021-10-17 MED ORDER — AMLODIPINE BESYLATE-VALSARTAN 10-320 MG PO TABS: 1.0000 | ORAL_TABLET | Freq: Every day | ORAL | 0 refills | Status: DC

## 2021-10-17 NOTE — Telephone Encounter (Signed)
Appointment 11/05/21 Requested Prescriptions  Pending Prescriptions Disp Refills  . amLODipine-valsartan (EXFORGE) 10-320 MG tablet 30 tablet 0    Sig: Take 1 tablet by mouth daily.     Cardiovascular: CCB + ARB Combos Failed - 10/16/2021 10:39 AM      Failed - Cr in normal range and within 180 days    Creatinine  Date Value Ref Range Status  12/12/2020 1.44 (H) 0.44 - 1.00 mg/dL Final   Creatinine, Ser  Date Value Ref Range Status  06/05/2021 1.47 (H) 0.44 - 1.00 mg/dL Final         Passed - K in normal range and within 180 days    Potassium  Date Value Ref Range Status  06/05/2021 4.4 3.5 - 5.1 mmol/L Final         Passed - Na in normal range and within 180 days    Sodium  Date Value Ref Range Status  06/05/2021 137 135 - 145 mmol/L Final  09/20/2019 140 134 - 144 mmol/L Final         Passed - Patient is not pregnant      Passed - Last BP in normal range    BP Readings from Last 1 Encounters:  10/09/21 138/86         Passed - Valid encounter within last 6 months    Recent Outpatient Visits          8 months ago Medication refill   [place], Michelle P, NP   9 months ago Type 2 diabetes mellitus without complication, without long-term current use of insulin ([place])   [place], Annie Main L, RPH-CPP   10 months ago Type 2 diabetes mellitus without complication, without long-term current use of insulin [facility])   [place], Jarome Matin, RPH-CPP   11 months ago Insulin resistance   [place], Jarome Matin, RPH-CPP   11 months ago Essential hypertension   [place], Michelle P, NP      Future Appointments            In 2 weeks Oletta Lamas [place] Cage, NP [place]

## 2021-10-21 ENCOUNTER — Encounter: Payer: Self-pay | Admitting: Physical Therapy

## 2021-10-21 ENCOUNTER — Encounter: Payer: Medicaid Other | Admitting: Physical Therapy

## 2021-10-21 DIAGNOSIS — M6281 Muscle weakness (generalized): Secondary | ICD-10-CM | POA: Diagnosis not present

## 2021-10-21 DIAGNOSIS — R102 Pelvic and perineal pain: Secondary | ICD-10-CM

## 2021-10-21 NOTE — Therapy (Signed)
OUTPATIENT PHYSICAL THERAPY TREATMENT NOTE   Patient Name: Tamara Osborne MRN: [id number] DOB:[birth year], 46 y.o., female Today's Date: 10/21/2021  PCP: Kerin Perna, NP REFERRING PROVIDER: [name], MD  END OF SESSION:   PT End of Session - 10/21/21 1033     Visit Number 3    Date for PT Re-Evaluation 12/30/21    Authorization Type [employer]    Authorization Time Period 10/07/2021-12/05/2021    Authorization - Visit Number 3    Authorization - Number of Visits 7    PT Start Time 7893    PT Stop Time 1115    PT Time Calculation (min) 45 min    Activity Tolerance Patient tolerated treatment well    Behavior During Therapy WFL for tasks assessed/performed             Past Medical History:  Diagnosis Date   Anemia    Anxiety    Bartholin cyst    Cervical cancer ([place])    Chronic kidney disease    Constipation    Depression    Deviated septum    Diabetes mellitus without complication ([place])    Difficult intravenous access    used port for 05-09-2019 surgery   Fibroids    History of blood transfusion    2 units given 04-26-2019, has had total of 8 units since jan 2021   History of blood transfusion 1 unit given 05-30-19   iv fluids also given   History of kidney problems    History of radiation therapy 03/23/2019-05/04/2019   Cervical external beam   Dr Gery Pray   History of radiation therapy 05/09/2019-06/05/2019   vaginal brachytherapy   Dr Gery Pray   History of recent blood transfusion 05/16/2019   2 units given  per dr Pollyann Savoy at cancer center   Hypertension    Neuropathy    both thumbs   Obesity    Palpitations    PONV (postoperative nausea and vomiting)    Prediabetes    Preeclampsia 2006   Sleep apnea    no cpap used insurance would not cover, osa severe per pt   SOB (shortness of breath)    Swallowing difficulty    Past Surgical History:  Procedure Laterality Date   CESAREAN SECTION WITH BILATERAL TUBAL LIGATION  11/10/2004        DILATION AND CURETTAGE OF UTERUS  1997   IR IMAGING GUIDED PORT INSERTION  04/04/2019   OPERATIVE ULTRASOUND N/A 05/09/2019   Procedure: OPERATIVE ULTRASOUND;  Surgeon: Gery Pray, MD;  Location: [place];  Service: Urology;  Laterality: N/A;   OPERATIVE ULTRASOUND N/A 05/15/2019   Procedure: OPERATIVE ULTRASOUND;  Surgeon: Gery Pray, MD;  Location: [facility];  Service: Urology;  Laterality: N/A;   OPERATIVE ULTRASOUND N/A 05/22/2019   Procedure: OPERATIVE ULTRASOUND;  Surgeon: Gery Pray, MD;  Location: [facility];  Service: Urology;  Laterality: N/A;   OPERATIVE ULTRASOUND N/A 05/29/2019   Procedure: OPERATIVE ULTRASOUND;  Surgeon: Gery Pray, MD;  Location: [facility];  Service: Urology;  Laterality: N/A;   OPERATIVE ULTRASOUND N/A 06/05/2019   Procedure: OPERATIVE ULTRASOUND;  Surgeon: Gery Pray, MD;  Location: [facility];  Service: Urology;  Laterality: N/A;   TANDEM RING INSERTION N/A 05/09/2019   Procedure: TANDEM RING INSERTION;  Surgeon: Gery Pray, MD;  Location: [facility];  Service: Urology;  Laterality: N/A;   TANDEM RING INSERTION N/A 05/15/2019  Procedure: TANDEM RING INSERTION;  Surgeon: Gery Pray, MD;  Location: [facility];  Service: Urology;  Laterality: N/A;   TANDEM RING INSERTION N/A 05/22/2019   Procedure: TANDEM RING INSERTION;  Surgeon: Gery Pray, MD;  [facility];  Service: Urology;  Laterality: N/A;   TANDEM RING INSERTION N/A 05/29/2019   Procedure: TANDEM RING INSERTION;  Surgeon: Gery Pray, MD;  Location: [facility];  Service: Urology;  Laterality: N/A;   TANDEM RING INSERTION N/A 06/05/2019   Procedure: TANDEM RING INSERTION;  Surgeon: Gery Pray, MD;  Location: [facility];  Service: Urology;  Laterality: N/A;   Patient Active Problem List   Diagnosis Date Noted    Type 2 diabetes mellitus with hyperglycemia, without long-term current use of insulin ([place]) 07/03/2021   Hyperlipidemia associated with type 2 diabetes mellitus ([place]) 07/03/2021   Type 2 diabetes mellitus without complication, without long-term current use of insulin ([place]) 11/15/2020   Incontinence of urine in female 09/09/2020   Onychomycosis of toenail 07/31/2020   OSA (obstructive sleep apnea) 07/31/2020   Prediabetes 05/27/2020   Sinus congestion 04/16/2020   Port-A-Cath in place 01/22/2020   Other hyperlipidemia 01/10/2020   Vitamin D deficiency 01/10/2020   Insulin resistance 01/10/2020   Class 3 severe obesity with serious comorbidity and body mass index (BMI) of 40.0 to 44.9 in adult ([place]) 01/10/2020   Obesity, Class II, BMI 35-39.9 09/07/2019   Dehydration 05/30/2019   Peripheral neuropathy due to chemotherapy ([employer]) 04/25/2019   Nausea without vomiting 04/18/2019   Diarrhea 04/04/2019   CKD (chronic kidney disease), symptom management only, stage 3 (moderate) ([place]) 03/30/2019   Generalized anxiety disorder 03/30/2019   Hypertension associated with type 2 diabetes mellitus ([place])    Anxiety 03/23/2019   Squamous cell carcinoma of cervix ([place]) 03/23/2019   Deficiency anemia 03/22/2019   Squamous cell carcinoma in situ (SCCIS) of cervix 03/22/2019   REFERRING DIAG:  C53.9 (ICD-10-CM) - Malignant neoplasm of cervix, unspecified site ([place])  N94.10 (ICD-10-CM) - Dyspareunia in female      THERAPY DIAG:  Muscle weakness (generalized)   Pelvic pain   Stress incontinence of urine   Aftercare following surgery for neoplasm   Rationale for Evaluation and Treatment Rehabilitation   ONSET DATE: 07/07/2021   SUBJECTIVE:                                                                                                                                                                                            SUBJECTIVE STATEMENT: I have the dilator handle and ohnut on my list and  waiting to  get paid. I am using the estrace cream. I leak urine with coughing and sneezing real hard.    PAIN:  Are you having pain? Yes NPRS scale: 8/10 Pain location: Vaginal   Pain type: aching, burning, sharp, and tight Pain description: intermittent    Aggravating factors: penile penetration; using the larger dilator, vaginal exam Relieving factors: no vaginal penetration   PRECAUTIONS: Other: cancer   WEIGHT BEARING RESTRICTIONS No   FALLS:  Has patient fallen in last 6 months? No   LIVING ENVIRONMENT: Lives with: lives with their partner   OCCUPATION: part time   PLOF: Independent   PATIENT GOALS reduce pain with vaginal penetration   PERTINENT HISTORY:  Cervical cancer with radiation; diabetes, c-section     BOWEL MOVEMENT Pain with bowel movement: No   URINATION Pain with urination: No Fully empty bladder: Yes:   Stream:  average Urgency: Yes: some watches what she drinks when she goes for a car ride Frequency: can wait 2-3 hours Leakage: Coughing and Sneezing Pads: Yes: when in a car for a distance   INTERCOURSE Pain with intercourse: During Penetration, After Intercourse, and after gets a soreness Ability to have vaginal penetration:  Yes: not always comfortable to have the penis deeper Climax: not since her cancer Marinoff Scale: 2/3   PREGNANCY Vaginal deliveries 3 Tearing No C-section deliveries 1 Currently pregnant No       OBJECTIVE:    DIAGNOSTIC FINDINGS:  none   PATIENT SURVEYS:  PFIQ-7 62; UIQ-7 38, POPIQ-7 24   COGNITION:            Overall cognitive status: Within functional limits for tasks assessed                          SENSATION:            Light touch: Appears intact            Proprioception: Appears intact   MUSCLE LENGTH: Hamstrings: Right decreased  deg; Left decreased deg Thomas test: Right positive deg; Left positive deg                  POSTURE: rounded shoulders, forward head, decreased lumbar  lordosis, and increased abdominal size               PELVIC ALIGNMENT: correct alignment   LUMBARAROM/PROM   A/PROM A/PROM  eval  Flexion full  Extension Decreased by 50%  Right lateral flexion Decreased by 25%  Left lateral flexion Decreased by 25%  Right rotation Decreased by 25%  Left rotation Decreased by 25%   (Blank rows = not tested)   LOWER EXTREMITY ROM:   Active ROM Right eval Left eval  Hip extension 0 0   (Blank rows = not tested)   LOWER EXTREMITY MMT:   MMT Right eval Left eval  Hip extension 3+/5 3+/5  Hip abduction 3+/5 4/5     PALPATION:   General  tenderness suprapubically, bilaterally hip adductor by the groin,                  External Perineal Exam along the perineal body, ishiocavernosis, bulbocavernosus                             Internal Pelvic Floor along the introitus, levator ani, obturator internist, side of the bladder and urethra, and at the vaginal vault. Therapist touched the vaginal  vault with her index finger that Is 4 inches long   Patient confirms identification and approves PT to assess internal pelvic floor and treatment Yes   PELVIC MMT:   MMT eval 10/21/2021  Vaginal 2/5 but not hug of therapist finger 3/5 with weak lift and hug of therapist finger  (Blank rows = not tested)         TONE: increased   PROLAPSE: None noted   TODAY'S TREATMENT  10/21/2021 Manual: Soft tissue mobilization:manual work to bilateral hip adductor, quadriceps and hamstring Myofascial release:release to the urogenital diaphragm Internal pelvic floor techniques:No emotional/communication barriers or cognitive limitation. Patient is motivated to learn. Patient understands and agrees with treatment goals and plan. PT explains patient will be examined in standing, sitting, and lying down to see how their muscles and joints work. When they are ready, they will be asked to remove their underwear so PT can examine their perineum. The patient is also  given the option of providing their own chaperone as one is not provided in our facility. The patient also has the right and is explained the right to defer or refuse any part of the evaluation or treatment including the internal exam. With the patient's consent, PT will use one gloved finger to gently assess the muscles of the pelvic floor, seeing how well it contracts and relaxes and if there is muscle symmetry. After, the patient will get dressed and PT and patient will discuss exam findings and plan of care. PT and patient discuss plan of care, schedule, attendance policy and HEP activities.   Going through he vaginal canal work on the left side of the upper left canal working on the restrictions and elongation of the tissue, manual work to the left urogenital diaphragm with one finger internally and other externally, manual work to the left levator ani  Exercises: Stretches/mobility:prone extension 10x to increase lumbar and hip extension Strengthening: prone hip extension 10 x2 bil. Keeping hips on mat Pelvic floor contraction with tactile cues Therapeutic activities: Functional strengthening activities: Self-care: Discussed getting the ohnut and handle for dilators    10/14/2021 Manual: Soft tissue mobilization:to the lower abdomen and suprapubic working on the release of the tissue.  Scar tissue mobilization: to the c-section scar to improve mobility.  Myofascial release:urogenital diaphragm release Internal pelvic floor techniques:No emotional/communication barriers or cognitive limitation. Patient is motivated to learn. Patient understands and agrees with treatment goals and plan. PT explains patient will be examined in standing, sitting, and lying down to see how their muscles and joints work. When they are ready, they will be asked to remove their underwear so PT can examine their perineum. The patient is also given the option of providing their own chaperone as one is not provided in our  facility. The patient also has the right and is explained the right to defer or refuse any part of the evaluation or treatment including the internal exam. With the patient's consent, PT will use one gloved finger to gently assess the muscles of the pelvic floor, seeing how well it contracts and relaxes and if there is muscle symmetry. After, the patient will get dressed and PT and patient will discuss exam findings and plan of care. PT and patient discuss plan of care, schedule, attendance policy and HEP activities.                          Manual work to the Animal nutritionist. Monitoring  for pain.  Exercises: Stretches/mobility:hamstring stretch with band 30 sec. Bil.                                      ITB stretch with band holding for 30 sec bil.                                      Happy baby one leg at a time holding for 30 sec                                     Hip flexor stretch in supine with therapist assistance holding 30 sec bil.                                      Piriformis stretch holding for 30 sec bil.  Therapeutic activities: Functional strengthening activities: Self-care: Discussed with patient on the ouh nut Talked to patient on how to apply the estrace cream and how often.     PATIENT EDUCATION: 10/21/2021 Education details: Access Code: [email or web address] Person educated: Patient Education method: Explanation, Demonstration, Tactile cues, Verbal cues, and Handouts Education comprehension: verbalized understanding, returned demonstration, verbal cues required, tactile cues required, and needs further education     HOME EXERCISE PROGRAM: 10/21/2021  Access Code: [email or web address], talked about the ohnut and handle for the vaginal dilator URL: [email or web address] Date: 10/21/2021 Prepared by: Earlie Counts  Program Notes use the estrace cream  into the vaginal canal 2 times per week. place it in the canal with the applicator then use your finger to rub  it in and the extra rub into the vulvar are. Use the good clean love daily on the vulva area.   Exercises - Hooklying Hamstring Stretch with Strap  - 1 x daily - 7 x weekly - 1 sets - 2 reps - 30 sec hold - Supine ITB Stretch with Strap  - 1 x daily - 7 x weekly - 1 sets - 2 reps - 30 sec hold - Supine Piriformis Stretch Pulling Heel to Hip  - 1 x daily - 7 x weekly - 1 sets - 2 reps - 30 sec hold - Prone Press Up  - 1 x daily - 7 x weekly - 1 sets - 10 reps - Prone Hip Extension with Pillow Under Abdomen  - 1 x daily - 3 x weekly - 2 sets - 10 reps ASSESSMENT:   CLINICAL IMPRESSION: Patient is a 46 y.o. female who was seen today for physical therapy  treatment for Dyspareunia.  Patient vaginal tissue looks healthier. Elongation of the vaginal canal was felt after manual work. Patient did not have pain with the manual work internally just a pinch feeling. She was able to stand straighter after doing the prone press ups and hip extension exercise. She is using the estrace cream Patient will benefit from skilled therapy to reduce pelvic pain, improve tissue extensibility for the radiation and reduce urinary leakage.      OBJECTIVE IMPAIRMENTS decreased activity tolerance, decreased coordination, decreased endurance, decreased strength, increased fascial restrictions, increased muscle spasms, impaired flexibility, and pain.    ACTIVITY LIMITATIONS bending, standing, continence, toileting, and vaginal exam  PARTICIPATION LIMITATIONS: interpersonal relationship, shopping, and community activity   PERSONAL FACTORS Cervical cancer with radiation; diabetes, c-section are also affecting patient's functional outcome.    REHAB POTENTIAL: Good   CLINICAL DECISION MAKING: Evolving/moderate complexity   EVALUATION COMPLEXITY: Moderate     GOALS: Goals reviewed with patient? Yes   SHORT TERM GOALS: Target date: 11/04/2021   Patient understand how to use moisturizers to the vaginal canal to improve  the quality of the tissue.  Baseline:not educated yet Goal status: met 10/14/2021  2.  Patient educated on Ohnut to use with her dilator to make it easier to insert and relax with it in the canal.  Baseline: Not educated yet Goal status: INITIAL   3.  Instructed on how to use the handle for the dilator to place it in the vaginal canal.  Baseline: not educated yet Goal status: INITIAL   LONG TERM GOALS: Target date: 12/30/2021    Patient is independent with her advanced HEP for pelvic floor stretches, hip strength and pelvic floor coordination.  Baseline: Not educated yet Goal status: INITIAL   2.  Patient able to have vaginal penetration with pain level </= 2-3/10 due to improved mobility of the tissue and accommodations using a device or different positions.  Baseline: pain level 8/10 Goal status: INITIAL   3.  Patient able to use the largest dilator with pain </= 1-2/10 to continue to remodel the fibrotic tissue of the vaginal wall.  Baseline: pain level 8/10 and on the third dilator Goal status: INITIAL   4.  Urinary leakage decreased >/= 70% due to the ability to fully relax and fully contract the pelvic floor and strength >/= 3/5 Baseline: urinary leakage with coughing and sneezing and strength is 2/5 without hug of therapist finger.  Goal status: INITIAL     PLAN: PT FREQUENCY: 1x/week   PT DURATION: 12 weeks   PLANNED INTERVENTIONS: Therapeutic exercises, Therapeutic activity, Neuromuscular re-education, Patient/Family education, Self Care, Joint mobilization, Dry Needling, Electrical stimulation, Spinal mobilization, Taping, Biofeedback, and Manual therapy   PLAN FOR NEXT SESSION: urogenital release, manual work to the thighs, internal work on the right side, work on pelvic floor contraction  Earlie Counts, PT 10/21/21 11:17 AM

## 2021-10-23 ENCOUNTER — Inpatient Hospital Stay: Payer: Medicaid Other | Attending: Gynecologic Oncology

## 2021-10-23 ENCOUNTER — Encounter: Payer: Self-pay | Admitting: Hematology and Oncology

## 2021-10-23 ENCOUNTER — Inpatient Hospital Stay: Payer: Medicaid Other | Admitting: Hematology and Oncology

## 2021-10-23 ENCOUNTER — Other Ambulatory Visit: Payer: Self-pay

## 2021-10-23 DIAGNOSIS — N941 Unspecified dyspareunia: Secondary | ICD-10-CM | POA: Insufficient documentation

## 2021-10-23 DIAGNOSIS — Z923 Personal history of irradiation: Secondary | ICD-10-CM | POA: Diagnosis not present

## 2021-10-23 DIAGNOSIS — C539 Malignant neoplasm of cervix uteri, unspecified: Secondary | ICD-10-CM

## 2021-10-23 DIAGNOSIS — Z8541 Personal history of malignant neoplasm of cervix uteri: Secondary | ICD-10-CM | POA: Insufficient documentation

## 2021-10-23 DIAGNOSIS — Z95828 Presence of other vascular implants and grafts: Secondary | ICD-10-CM

## 2021-10-23 DIAGNOSIS — N183 Chronic kidney disease, stage 3 unspecified: Secondary | ICD-10-CM | POA: Diagnosis not present

## 2021-10-23 LAB — COMPREHENSIVE METABOLIC PANEL
ALT: 33 U/L (ref 0–44)
AST: 18 U/L (ref 15–41)
Albumin: 4.1 g/dL (ref 3.5–5.0)
Alkaline Phosphatase: 84 U/L (ref 38–126)
Anion gap: 5 (ref 5–15)
BUN: 22 mg/dL — ABNORMAL HIGH (ref 6–20)
CO2: 27 mmol/L (ref 22–32)
Calcium: 9.3 mg/dL (ref 8.9–10.3)
Chloride: 106 mmol/L (ref 98–111)
Creatinine, Ser: 1.21 mg/dL — ABNORMAL HIGH (ref 0.44–1.00)
GFR, Estimated: 56 mL/min — ABNORMAL LOW (ref >60)
Glucose, Bld: 186 mg/dL — ABNORMAL HIGH (ref 70–99)
Potassium: 4 mmol/L (ref 3.5–5.1)
Sodium: 138 mmol/L (ref 135–145)
Total Bilirubin: 0.4 mg/dL (ref 0.3–1.2)
Total Protein: 7.1 g/dL (ref 6.5–8.1)

## 2021-10-23 LAB — CBC WITH DIFFERENTIAL/PLATELET
Abs Immature Granulocytes: 0.04 10*3/uL (ref 0.00–0.07)
Basophils Absolute: 0 10*3/uL (ref 0.0–0.1)
Basophils Relative: 0 %
Eosinophils Absolute: 0.5 10*3/uL (ref 0.0–0.5)
Eosinophils Relative: 6 %
HCT: 38.6 % (ref 36.0–46.0)
Hemoglobin: 13.6 g/dL (ref 12.0–15.0)
Immature Granulocytes: 1 %
Lymphocytes Relative: 16 %
Lymphs Abs: 1.2 10*3/uL (ref 0.7–4.0)
MCH: 28.1 pg (ref 26.0–34.0)
MCHC: 35.2 g/dL (ref 30.0–36.0)
MCV: 79.8 fL — ABNORMAL LOW (ref 80.0–100.0)
Monocytes Absolute: 0.3 10*3/uL (ref 0.1–1.0)
Monocytes Relative: 4 %
Neutro Abs: 5.9 10*3/uL (ref 1.7–7.7)
Neutrophils Relative %: 73 %
Platelets: 278 10*3/uL (ref 150–400)
RBC: 4.84 MIL/uL (ref 3.87–5.11)
RDW: 13.2 % (ref 11.5–15.5)
WBC: 7.9 10*3/uL (ref 4.0–10.5)
nRBC: 0 % (ref 0.0–0.2)

## 2021-10-23 MED ORDER — HEPARIN SOD (PORK) LOCK FLUSH 100 UNIT/ML IV SOLN
500.0000 [IU] | Freq: Once | INTRAVENOUS | Status: AC
  Administered 2021-10-23: 500 [IU]

## 2021-10-23 MED ORDER — SODIUM CHLORIDE 0.9% FLUSH
10.0000 mL | Freq: Once | INTRAVENOUS | Status: AC
  Administered 2021-10-23: 10 mL

## 2021-10-23 NOTE — Assessment & Plan Note (Signed)
Her recent Pap smear was negative She is a cancer survivor There is no role for routine surveillance imaging She is agreeable to get her port removed She does not need return follow-up to see me in the future

## 2021-10-23 NOTE — Assessment & Plan Note (Signed)
She has chronic kidney disease stage III with multiple risk factors We discussed importance of hydration and risk factor modifications

## 2021-10-23 NOTE — Assessment & Plan Note (Signed)
This is likely due to her postmenopausal status and history of radiation The patient is using plenty of lubricants and topical estrogen in addition to dilators

## 2021-10-23 NOTE — Progress Notes (Signed)
[place] OFFICE PROGRESS NOTE  Patient Care Team: Kerin Perna, NP as PCP - General (Internal Medicine)  ASSESSMENT & PLAN:  Squamous cell carcinoma of cervix ([place]) Her recent Pap smear was negative She is a cancer survivor There is no role for routine surveillance imaging She is agreeable to get her port removed She does not need return follow-up to see me in the future  CKD (chronic kidney disease), symptom management only, stage 3 (moderate) She has chronic kidney disease stage III with multiple risk factors We discussed importance of hydration and risk factor modifications  Dyspareunia in female This is likely due to her postmenopausal status and history of radiation The patient is using plenty of lubricants and topical estrogen in addition to dilators  No orders of the defined types were placed in this encounter.   All questions were answered. The patient knows to call the clinic with any problems, questions or concerns. The total time spent in the appointment was 25 minutes encounter with patients including review of chart and various tests results, discussions about plan of care and coordination of care plan   [name], MD 10/23/2021 12:49 PM  INTERVAL HISTORY: Please see below for problem oriented charting. she returns for surveillance follow-up for history of cervical cancer She is doing well Denies pelvic pain or discharge or bleeding She has intermittent pelvic discomfort usually after sexual intercourse but it is improving with topical estrogen cream She has gone back to work  REVIEW OF SYSTEMS:   Constitutional: Denies fevers, chills or abnormal weight loss Eyes: Denies blurriness of vision Ears, nose, mouth, throat, and face: Denies mucositis or sore throat Respiratory: Denies cough, dyspnea or wheezes Cardiovascular: Denies palpitation, chest discomfort or lower extremity swelling Gastrointestinal:  Denies nausea, heartburn or  change in bowel habits Skin: Denies abnormal skin rashes Lymphatics: Denies new lymphadenopathy or easy bruising Neurological:Denies numbness, tingling or new weaknesses Behavioral/Psych: Mood is stable, no new changes  All other systems were reviewed with the patient and are negative.  I have reviewed the past medical history, past surgical history, social history and family history with the patient and they are unchanged from previous note.  ALLERGIES:  is allergic to penicillins, shellfish allergy, and sulfa antibiotics.  MEDICATIONS:  Current Outpatient Medications  Medication Sig Dispense Refill   Accu-Chek Softclix Lancets lancets Use to check blood sugar TID. 100 each 2   amLODipine-valsartan (EXFORGE) 10-320 MG tablet Take 1 tablet by mouth daily. 30 tablet 0   atorvastatin (LIPITOR) 10 MG tablet Take 1 tablet (10 mg total) by mouth daily. 90 tablet 0   Blood Glucose Monitoring Suppl (ACCU-CHEK GUIDE) w/Device KIT Use to check blood sugar TID. 1 kit 0   chlorhexidine (PERIDEX) 0.12 % solution SMARTSIG:By Mouth     Cholecalciferol (VITAMIN D3) 50 MCG (2000 UT) TABS Take 2,000 Units by mouth daily.     CLONIDINE HCL PO Take 0.1 mg by mouth in the morning and at bedtime. Pending BP readings     conjugated estrogens (PREMARIN) vaginal cream Place 1 Applicatorful vaginally 3 (three) times a week. 42.5 g 1   fluticasone (FLONASE) 50 MCG/ACT nasal spray Place 1 spray into both nostrils daily as needed for allergies or rhinitis.     glucose blood (ACCU-CHEK GUIDE) test strip Use to check blood sugar TID. 100 each 2   Insulin Pen Needle (BD PEN NEEDLE NANO 2ND GEN) 32G X 4 MM MISC 1 Package by Does not apply route 2 (  two) times daily. 100 each 0   lidocaine-prilocaine (EMLA) cream Apply 1 application topically as needed. Apply to port access daily as needed 30 g 3   metFORMIN (GLUCOPHAGE-XR) 500 MG 24 hr tablet Take 1 tablet (500 mg total) by mouth daily with breakfast. 90 tablet 1   Misc.  Devices MISC CMP with GFR 1 ampule 0   mupirocin ointment (BACTROBAN) 2 % 1 application 3 (three) times daily.     Semaglutide,0.25 or 0.5MG/DOS, (OZEMPIC, 0.25 OR 0.5 MG/DOSE,) 2 MG/1.5ML SOPN Inject 0.5 mg into the skin once a week. 3 mL 0   No current facility-administered medications for this visit.    SUMMARY OF ONCOLOGIC HISTORY: Oncology History  Squamous cell carcinoma of cervix ([place])  03/21/2019 Pathology Results   The biopsy is superficial and therefore depth of invasion cannot be  determined.  There is at least carcinoma in-situ.    03/23/2019 Initial Diagnosis   Squamous cell carcinoma of cervix ([place])   03/23/2019 Pathology Results   CERVIX, 11 O CLOCK, BIOPSY:  -  Squamous cell carcinoma, invasive  -  See comment   03/23/2019 - 06/05/2019 Radiation Therapy   External beam therapy was from 03/23/2019 - 05/04/2019. HDR vaginal brachytherapy was from 05/19/2019 - 06/05/2019.   Site Technique Total Dose (Gy) Dose per Fx (Gy) Completed Fx Beam Energies  Cervix: Cervix HDR-brachy 5.5/5.5 5.5 1/1 Ir-192  Cervix: Cervix HDR-brachy 5.5/5.5 5.5 1/1 Ir-192  Cervix: Cervix_Bst HDR-brachy 5.5/5.5 5.5 1/1 Ir-192  Cervix: Cervix HDR-brachy 5.5/5.5 5.5 1/1 Ir-192  Cervix: Cervix 3D 45/45 1.8 25/25 10X, 15X  Cervix: Cervix HDR-brachy 5.5/5.5 5.5 1/1 Ir-192  Cervix: Cervix_Bst 3D 9/9 1.8 5/5 15X      03/28/2019 PET scan   1. Hypermetabolic cervical mass measures roughly 7.6 by 6.9 by 4.9 cm, compatible with malignancy. No adenopathy or distant metastatic spread identified. 2. Extending cephalad and anterior to the uterine fundus, there is a 450 cubic cm simple fluid density structure without hypermetabolic activity. This may represent a right adnexal cyst (favored), peritoneal inclusion cyst, or (if the patient has a remote history of pancreatitis) a remote pseudocyst. 3. Faint ground density nodularity in both lower lobes which could be from atypical infection or extrinsic allergic  alveolitis. Given nodular appearance of some of this ground-glass density, I would recommend follow up chest CT in 3 months time in order to ensure clearance. 4. Moderate cardiomegaly, cause uncertain. The patient also has advanced for age/gender atherosclerosis. Aortic Atherosclerosis (ICD10-I70.0).     04/04/2019 Procedure   Successful placement of a right internal jugular approach power injectable Port-A-Cath. The catheter is ready for immediate use   04/07/2019 - 05/12/2019 Chemotherapy   The patient had weekly cisplatin for chemotherapy treatment.     09/06/2019 PET scan   1. No evidence residual hypermetabolic cervical tissue. 2. No evidence of metastatic cervical carcinoma. 3. Large benign cystic mass unchanged in the upper pelvis.         PHYSICAL EXAMINATION: ECOG PERFORMANCE STATUS: 0 - Asymptomatic  Vitals:   10/23/21 1241  BP: (!) 133/91  Pulse: 90  Resp: 18  Temp: (!) 97.5 F (36.4 C)  SpO2: 98%   Filed Weights   10/23/21 1241  Weight: 223 lb 12.8 oz (101.5 kg)    GENERAL:alert, no distress and comfortable NEURO: alert & oriented x 3 with fluent speech, no focal motor/sensory deficits  LABORATORY DATA:  I have reviewed the data as listed    Component Value Date/Time  NA 137 06/05/2021 1132   NA 140 09/20/2019 1106   K 4.4 06/05/2021 1132   CL 105 06/05/2021 1132   CO2 26 06/05/2021 1132   GLUCOSE 105 (H) 06/05/2021 1132   BUN 28 (H) 06/05/2021 1132   BUN 20 09/20/2019 1106   CREATININE 1.47 (H) 06/05/2021 1132   CREATININE 1.44 (H) 12/12/2020 1108   CALCIUM 9.6 06/05/2021 1132   PROT 7.9 06/05/2021 1132   PROT 7.9 09/20/2019 1106   ALBUMIN 4.2 06/05/2021 1132   ALBUMIN 4.5 09/20/2019 1106   AST 19 06/05/2021 1132   AST 49 (H) 12/12/2020 1108   ALT 34 06/05/2021 1132   ALT 70 (H) 12/12/2020 1108   ALKPHOS 93 06/05/2021 1132   BILITOT 0.5 06/05/2021 1132   BILITOT 1.0 12/12/2020 1108   GFRNONAA 45 (L) 06/05/2021 1132   GFRNONAA 46 (L)  12/12/2020 1108   GFRAA 55 (L) 09/20/2019 1106    No results found for: "SPEP", "UPEP"  Lab Results  Component Value Date   WBC 7.9 10/23/2021   NEUTROABS 5.9 10/23/2021   HGB 13.6 10/23/2021   HCT 38.6 10/23/2021   MCV 79.8 (L) 10/23/2021   PLT 278 10/23/2021      Chemistry      Component Value Date/Time   NA 137 06/05/2021 1132   NA 140 09/20/2019 1106   K 4.4 06/05/2021 1132   CL 105 06/05/2021 1132   CO2 26 06/05/2021 1132   BUN 28 (H) 06/05/2021 1132   BUN 20 09/20/2019 1106   CREATININE 1.47 (H) 06/05/2021 1132   CREATININE 1.44 (H) 12/12/2020 1108      Component Value Date/Time   CALCIUM 9.6 06/05/2021 1132   ALKPHOS 93 06/05/2021 1132   AST 19 06/05/2021 1132   AST 49 (H) 12/12/2020 1108   ALT 34 06/05/2021 1132   ALT 70 (H) 12/12/2020 1108   BILITOT 0.5 06/05/2021 1132   BILITOT 1.0 12/12/2020 1108

## 2021-10-24 ENCOUNTER — Ambulatory Visit (INDEPENDENT_AMBULATORY_CARE_PROVIDER_SITE_OTHER): Payer: Medicaid Other | Admitting: Primary Care

## 2021-10-27 ENCOUNTER — Ambulatory Visit (HOSPITAL_COMMUNITY)
Admission: RE | Admit: 2021-10-27 | Discharge: 2021-10-27 | Disposition: A | Discharge status: Home / Self Care | Payer: Medicaid Other | Source: Ambulatory Visit | Attending: Gynecologic Oncology | Admitting: Gynecologic Oncology

## 2021-10-27 ENCOUNTER — Ambulatory Visit (INDEPENDENT_AMBULATORY_CARE_PROVIDER_SITE_OTHER): Payer: Medicaid Other | Admitting: Primary Care

## 2021-10-27 DIAGNOSIS — Z452 Encounter for adjustment and management of vascular access device: Secondary | ICD-10-CM | POA: Insufficient documentation

## 2021-10-27 DIAGNOSIS — C539 Malignant neoplasm of cervix uteri, unspecified: Secondary | ICD-10-CM

## 2021-10-27 HISTORY — PX: IR REMOVAL TUN ACCESS W/ PORT W/O FL MOD SED: IMG2290

## 2021-10-27 MED ORDER — LIDOCAINE HCL 1 % IJ SOLN
INTRAMUSCULAR | Status: AC
  Filled 2021-10-27: qty 20

## 2021-10-27 NOTE — Procedures (Signed)
Interventional Radiology Procedure Note  Procedure: Port removal  Complications: None  Estimated Blood Loss: < 10 mL  Findings: Right chest port removed in entirety with sharp and blunt dissection; wound closed.  Venetia Night. Kathlene Cote, M.D Pager:  [phone]

## 2021-10-28 ENCOUNTER — Encounter: Payer: Self-pay | Admitting: Physical Therapy

## 2021-10-28 ENCOUNTER — Encounter: Payer: Medicaid Other | Admitting: Physical Therapy

## 2021-10-28 DIAGNOSIS — R102 Pelvic and perineal pain: Secondary | ICD-10-CM

## 2021-10-28 DIAGNOSIS — N393 Stress incontinence (female) (male): Secondary | ICD-10-CM

## 2021-10-28 DIAGNOSIS — M6281 Muscle weakness (generalized): Secondary | ICD-10-CM

## 2021-10-28 NOTE — Therapy (Signed)
OUTPATIENT PHYSICAL THERAPY TREATMENT NOTE   Patient Name: Tamara Osborne MRN: [id number] DOB:1975-04-22, 46 y.o., female Today's Date: 10/28/2021  PCP: Kerin Perna, NP REFERRING PROVIDER: [name], MD  END OF SESSION:   PT End of Session - 10/28/21 1135     Visit Number 4    Date for PT Re-Evaluation 12/30/21    Authorization Type [employer]    Authorization Time Period 10/07/2021-12/05/2021    Authorization - Visit Number 4    Authorization - Number of Visits 7    PT Start Time 1130    PT Stop Time 3159    PT Time Calculation (min) 45 min    Activity Tolerance Patient tolerated treatment well    Behavior During Therapy [facility] for tasks assessed/performed             Past Medical History:  Diagnosis Date   Anemia    Anxiety    Bartholin cyst    Cervical cancer ([place])    Chronic kidney disease    Constipation    Depression    Deviated septum    Diabetes mellitus without complication ([place])    Difficult intravenous access    used port for 05-09-2019 surgery   Fibroids    History of blood transfusion    2 units given 04-26-2019, has had total of 8 units since jan 2021   History of blood transfusion 1 unit given 05-30-19   iv fluids also given   History of kidney problems    History of radiation therapy 03/23/2019-05/04/2019   Cervical external beam   Dr Gery Pray   History of radiation therapy 05/09/2019-06/05/2019   vaginal brachytherapy   Dr Gery Pray   History of recent blood transfusion 05/16/2019   2 units given  per dr Pollyann Savoy at cancer center   Hypertension    Neuropathy    both thumbs   Obesity    Palpitations    PONV (postoperative nausea and vomiting)    Prediabetes    Preeclampsia 2006   Sleep apnea    no cpap used insurance would not cover, osa severe per pt   SOB (shortness of breath)    Swallowing difficulty    Past Surgical History:  Procedure Laterality Date   CESAREAN SECTION WITH BILATERAL TUBAL LIGATION  11/10/2004        DILATION AND CURETTAGE OF UTERUS  1997   IR IMAGING GUIDED PORT INSERTION  04/04/2019   IR REMOVAL TUN ACCESS W/ PORT W/O FL MOD SED  10/27/2021   OPERATIVE ULTRASOUND N/A 05/09/2019   Procedure: OPERATIVE ULTRASOUND;  Surgeon: Gery Pray, MD;  Location: [place];  Service: Urology;  Laterality: N/A;   OPERATIVE ULTRASOUND N/A 05/15/2019   Procedure: OPERATIVE ULTRASOUND;  Surgeon: Gery Pray, MD;  Location: [facility];  Service: Urology;  Laterality: N/A;   OPERATIVE ULTRASOUND N/A 05/22/2019   Procedure: OPERATIVE ULTRASOUND;  Surgeon: Gery Pray, MD;  Location: [facility];  Service: Urology;  Laterality: N/A;   OPERATIVE ULTRASOUND N/A 05/29/2019   Procedure: OPERATIVE ULTRASOUND;  Surgeon: Gery Pray, MD;  Location: [facility];  Service: Urology;  Laterality: N/A;   OPERATIVE ULTRASOUND N/A 06/05/2019   Procedure: OPERATIVE ULTRASOUND;  Surgeon: Gery Pray, MD;  Location: [facility];  Service: Urology;  Laterality: N/A;   TANDEM RING INSERTION N/A 05/09/2019   Procedure: TANDEM RING INSERTION;  Surgeon: Gery Pray, MD;  Location: [facility];  Service: Urology;  Laterality: N/A;   TANDEM RING INSERTION N/A 05/15/2019   Procedure: TANDEM RING INSERTION;  Surgeon: Gery Pray, MD;  Location: [facility];  Service: Urology;  Laterality: N/A;   TANDEM RING INSERTION N/A 05/22/2019   Procedure: TANDEM RING INSERTION;  Surgeon: Gery Pray, MD;  Location: [facility];  Service: Urology;  Laterality: N/A;   TANDEM RING INSERTION N/A 05/29/2019   Procedure: TANDEM RING INSERTION;  Surgeon: Gery Pray, MD;  Location: [facility];  Service: Urology;  Laterality: N/A;   TANDEM RING INSERTION N/A 06/05/2019   Procedure: TANDEM RING INSERTION;  Surgeon: Gery Pray, MD;  Location: [facility];  Service: Urology;  Laterality: N/A;    Patient Active Problem List   Diagnosis Date Noted   Dyspareunia in female 10/23/2021   Type 2 diabetes mellitus with hyperglycemia, without long-term current use of insulin ([place]) 07/03/2021   Hyperlipidemia associated with type 2 diabetes mellitus ([place]) 07/03/2021   Type 2 diabetes mellitus without complication, without long-term current use of insulin ([place]) 11/15/2020   Incontinence of urine in female 09/09/2020   Onychomycosis of toenail 07/31/2020   OSA (obstructive sleep apnea) 07/31/2020   Prediabetes 05/27/2020   Sinus congestion 04/16/2020   Port-A-Cath in place 01/22/2020   Other hyperlipidemia 01/10/2020   Vitamin D deficiency 01/10/2020   Insulin resistance 01/10/2020   Class 3 severe obesity with serious comorbidity and body mass index (BMI) of 40.0 to 44.9 in adult ([place]) 01/10/2020   Obesity, Class II, BMI 35-39.9 09/07/2019   Dehydration 05/30/2019   Peripheral neuropathy due to chemotherapy ([place]) 04/25/2019   Nausea without vomiting 04/18/2019   Diarrhea 04/04/2019   CKD (chronic kidney disease), symptom management only, stage 3 (moderate) ([place]) 03/30/2019   Generalized anxiety disorder 03/30/2019   Hypertension associated with type 2 diabetes mellitus ([place])    Anxiety 03/23/2019   Squamous cell carcinoma of cervix ([place]) 03/23/2019   Deficiency anemia 03/22/2019   REFERRING DIAG:  C53.9 (ICD-10-CM) - Malignant neoplasm of cervix, unspecified site ([place])  N94.10 (ICD-10-CM) - Dyspareunia in female      THERAPY DIAG:  Muscle weakness (generalized)   Pelvic pain   Stress incontinence of urine   Aftercare following surgery for neoplasm   Rationale for Evaluation and Treatment Rehabilitation   ONSET DATE: 07/07/2021   SUBJECTIVE:                                                                                                                                                                                            SUBJECTIVE STATEMENT: I got my port out.  I  do not have as much pinching after removing the dilator. I feel less dryness. I am still using the purple dilator.    PAIN:  Are you having pain? Yes NPRS scale: 8/10 Pain location: Vaginal   Pain type: aching, burning, sharp, and tight Pain description: intermittent    Aggravating factors: penile penetration; using the larger dilator, vaginal exam Relieving factors: no vaginal penetration   PRECAUTIONS: Other: cancer   WEIGHT BEARING RESTRICTIONS No   FALLS:  Has patient fallen in last 6 months? No   LIVING ENVIRONMENT: Lives with: lives with their partner   OCCUPATION: part time   PLOF: Independent   PATIENT GOALS reduce pain with vaginal penetration   PERTINENT HISTORY:  Cervical cancer with radiation; diabetes, c-section     BOWEL MOVEMENT Pain with bowel movement: No   URINATION Pain with urination: No Fully empty bladder: Yes:   Stream:  average Urgency: Yes: some watches what she drinks when she goes for a car ride Frequency: can wait 2-3 hours Leakage: Coughing and Sneezing Pads: Yes: when in a car for a distance   INTERCOURSE Pain with intercourse: During Penetration, After Intercourse, and after gets a soreness Ability to have vaginal penetration:  Yes: not always comfortable to have the penis deeper Climax: not since her cancer Marinoff Scale: 2/3   PREGNANCY Vaginal deliveries 3 Tearing No C-section deliveries 1 Currently pregnant No       OBJECTIVE:    DIAGNOSTIC FINDINGS:  none   PATIENT SURVEYS:  PFIQ-7 62; UIQ-7 38, POPIQ-7 24   COGNITION:            Overall cognitive status: Within functional limits for tasks assessed                          SENSATION:            Light touch: Appears intact            Proprioception: Appears intact   MUSCLE LENGTH: Hamstrings: Right decreased  deg; Left decreased deg Thomas test: Right positive deg; Left positive deg                  POSTURE: rounded shoulders, forward head, decreased  lumbar lordosis, and increased abdominal size               PELVIC ALIGNMENT: correct alignment   LUMBARAROM/PROM   A/PROM A/PROM  eval  Flexion full  Extension Decreased by 50%  Right lateral flexion Decreased by 25%  Left lateral flexion Decreased by 25%  Right rotation Decreased by 25%  Left rotation Decreased by 25%   (Blank rows = not tested)   LOWER EXTREMITY ROM:   Active ROM Right eval Left eval  Hip extension 0 0   (Blank rows = not tested)   LOWER EXTREMITY MMT:   MMT Right eval Left eval  Hip extension 3+/5 3+/5  Hip abduction 3+/5 4/5     PALPATION:   General  tenderness suprapubically, bilaterally hip adductor by the groin,                  External Perineal Exam along the perineal body, ishiocavernosis, bulbocavernosus                             Internal Pelvic Floor along the introitus, levator ani, obturator internist, side of the bladder and urethra, and at the  vaginal vault. Therapist touched the vaginal vault with her index finger that Is 4 inches long   Patient confirms identification and approves PT to assess internal pelvic floor and treatment Yes   PELVIC MMT:   MMT eval 10/21/2021  Vaginal 2/5 but not hug of therapist finger 3/5 with weak lift and hug of therapist finger  (Blank rows = not tested)         TONE: increased   PROLAPSE: None noted   TODAY'S TREATMENT  10/28/2021 Manual: Soft tissue mobilization:To bilateral hip adductors, quadriceps, and hamstring Myofascial release:release of the urogenital diaphragm with one hand the hip adductors and along the pubic rim.  Internal pelvic floor techniques:No emotional/communication barriers or cognitive limitation. Patient is motivated to learn. Patient understands and agrees with treatment goals and plan. PT explains patient will be examined in standing, sitting, and lying down to see how their muscles and joints work. When they are ready, they will be asked to remove their underwear so  PT can examine their perineum. The patient is also given the option of providing their own chaperone as one is not provided in our facility. The patient also has the right and is explained the right to defer or refuse any part of the evaluation or treatment including the internal exam. With the patient's consent, PT will use one gloved finger to gently assess the muscles of the pelvic floor, seeing how well it contracts and relaxes and if there is muscle symmetry. After, the patient will get dressed and PT and patient will discuss exam findings and plan of care. PT and patient discuss plan of care, schedule, attendance policy and HEP activities.  Going through the vaginal canal working on the vaginal vault, working on the right side of the vaginal canal. And releasing around the sides fo the bladder and above the bladder   10/21/2021 Manual: Soft tissue mobilization:manual work to bilateral hip adductor, quadriceps and hamstring Myofascial release:release to the urogenital diaphragm Internal pelvic floor techniques:No emotional/communication barriers or cognitive limitation. Patient is motivated to learn. Patient understands and agrees with treatment goals and plan. PT explains patient will be examined in standing, sitting, and lying down to see how their muscles and joints work. When they are ready, they will be asked to remove their underwear so PT can examine their perineum. The patient is also given the option of providing their own chaperone as one is not provided in our facility. The patient also has the right and is explained the right to defer or refuse any part of the evaluation or treatment including the internal exam. With the patient's consent, PT will use one gloved finger to gently assess the muscles of the pelvic floor, seeing how well it contracts and relaxes and if there is muscle symmetry. After, the patient will get dressed and PT and patient will discuss exam findings and plan of care. PT  and patient discuss plan of care, schedule, attendance policy and HEP activities.   Going through he vaginal canal work on the left side of the upper left canal working on the restrictions and elongation of the tissue, manual work to the left urogenital diaphragm with one finger internally and other externally, manual work to the left levator ani  Exercises: Stretches/mobility:prone extension 10x to increase lumbar and hip extension Strengthening: prone hip extension 10 x2 bil. Keeping hips on mat Pelvic floor contraction with tactile cues Therapeutic activities: Functional strengthening activities: Self-care: Discussed getting the ohnut and handle for dilators  10/14/2021 Manual: Soft tissue mobilization:to the lower abdomen and suprapubic working on the release of the tissue.  Scar tissue mobilization: to the c-section scar to improve mobility.  Myofascial release:urogenital diaphragm release Internal pelvic floor techniques:No emotional/communication barriers or cognitive limitation. Patient is motivated to learn. Patient understands and agrees with treatment goals and plan. PT explains patient will be examined in standing, sitting, and lying down to see how their muscles and joints work. When they are ready, they will be asked to remove their underwear so PT can examine their perineum. The patient is also given the option of providing their own chaperone as one is not provided in our facility. The patient also has the right and is explained the right to defer or refuse any part of the evaluation or treatment including the internal exam. With the patient's consent, PT will use one gloved finger to gently assess the muscles of the pelvic floor, seeing how well it contracts and relaxes and if there is muscle symmetry. After, the patient will get dressed and PT and patient will discuss exam findings and plan of care. PT and patient discuss plan of care, schedule, attendance policy and HEP  activities.                          Manual work to the Animal nutritionist. Monitoring for pain.  Exercises: Stretches/mobility:hamstring stretch with band 30 sec. Bil.                                      ITB stretch with band holding for 30 sec bil.                                      Happy baby one leg at a time holding for 30 sec                                     Hip flexor stretch in supine with therapist assistance holding 30 sec bil.                                      Piriformis stretch holding for 30 sec bil.  Therapeutic activities: Functional strengthening activities: Self-care: Discussed with patient on the ouh nut Talked to patient on how to apply the estrace cream and how often.     PATIENT EDUCATION: 10/21/2021 Education details: Access Code: [email or web address] Person educated: Patient Education method: Explanation, Demonstration, Tactile cues, Verbal cues, and Handouts Education comprehension: verbalized understanding, returned demonstration, verbal cues required, tactile cues required, and needs further education     HOME EXERCISE PROGRAM: 10/21/2021  Access Code: [email or web address], talked about the ohnut and handle for the vaginal dilator URL: [email or web address] Date: 10/21/2021 Prepared by: Earlie Counts   Program Notes use the estrace cream  into the vaginal canal 2 times per week. place it in the canal with the applicator then use your finger to rub it in and the extra rub into the vulvar are. Use the good clean love daily on the vulva area.    Exercises - Hooklying Hamstring Stretch with Strap  - 1 x daily -  7 x weekly - 1 sets - 2 reps - 30 sec hold - Supine ITB Stretch with Strap  - 1 x daily - 7 x weekly - 1 sets - 2 reps - 30 sec hold - Supine Piriformis Stretch Pulling Heel to Hip  - 1 x daily - 7 x weekly - 1 sets - 2 reps - 30 sec hold - Prone Press Up  - 1 x daily - 7 x weekly - 1 sets - 10 reps - Prone Hip Extension with Pillow Under  Abdomen  - 1 x daily - 3 x weekly - 2 sets - 10 reps ASSESSMENT:   CLINICAL IMPRESSION: Patient is a 46 y.o. female who was seen today for physical therapy  treatment for Dyspareunia.  Patient is going to get the handle to use for the dilator. She is on the dilator that is just below the largest one. She has improved mobility of the thigh muscles. Patient has her port out. She is able to contract her pelvic floor with a lift and strength is 3/5. Patient will benefit from skilled therapy to reduce pelvic pain, improve tissue extensibility for the radiation and reduce urinary leakage.      OBJECTIVE IMPAIRMENTS decreased activity tolerance, decreased coordination, decreased endurance, decreased strength, increased fascial restrictions, increased muscle spasms, impaired flexibility, and pain.    ACTIVITY LIMITATIONS bending, standing, continence, toileting, and vaginal exam   PARTICIPATION LIMITATIONS: interpersonal relationship, shopping, and community activity   PERSONAL FACTORS Cervical cancer with radiation; diabetes, c-section are also affecting patient's functional outcome.    REHAB POTENTIAL: Good   CLINICAL DECISION MAKING: Evolving/moderate complexity   EVALUATION COMPLEXITY: Moderate     GOALS: Goals reviewed with patient? Yes   SHORT TERM GOALS: Target date: 11/04/2021   Patient understand how to use moisturizers to the vaginal canal to improve the quality of the tissue.  Baseline:not educated yet Goal status: met 10/14/2021  2.  Patient educated on Ohnut to use with her dilator to make it easier to insert and relax with it in the canal.  Baseline: Not educated yet Goal status: INITIAL   3.  Instructed on how to use the handle for the dilator to place it in the vaginal canal.  Baseline: not educated yet Goal status: INITIAL   LONG TERM GOALS: Target date: 12/30/2021    Patient is independent with her advanced HEP for pelvic floor stretches, hip strength and pelvic floor  coordination.  Baseline: Not educated yet Goal status: INITIAL   2.  Patient able to have vaginal penetration with pain level </= 2-3/10 due to improved mobility of the tissue and accommodations using a device or different positions.  Baseline: pain level 8/10 Goal status: INITIAL   3.  Patient able to use the largest dilator with pain </= 1-2/10 to continue to remodel the fibrotic tissue of the vaginal wall.  Baseline: pain level 8/10 and on the third dilator Goal status: INITIAL   4.  Urinary leakage decreased >/= 70% due to the ability to fully relax and fully contract the pelvic floor and strength >/= 3/5 Baseline: urinary leakage with coughing and sneezing and strength is 2/5 without hug of therapist finger.  Goal status: INITIAL     PLAN: PT FREQUENCY: 1x/week   PT DURATION: 12 weeks   PLANNED INTERVENTIONS: Therapeutic exercises, Therapeutic activity, Neuromuscular re-education, Patient/Family education, Self Care, Joint mobilization, Dry Needling, Electrical stimulation, Spinal mobilization, Taping, Biofeedback, and Manual therapy   PLAN FOR NEXT SESSION: urogenital  release, manual work to the thighs, internal work on the right side, work on pelvic floor contraction Earlie Counts, PT 10/28/21 12:44 PM

## 2021-10-30 ENCOUNTER — Other Ambulatory Visit: Payer: Self-pay | Admitting: Gynecologic Oncology

## 2021-11-05 ENCOUNTER — Ambulatory Visit (INDEPENDENT_AMBULATORY_CARE_PROVIDER_SITE_OTHER): Payer: Medicaid Other | Admitting: Primary Care

## 2021-11-05 ENCOUNTER — Encounter (INDEPENDENT_AMBULATORY_CARE_PROVIDER_SITE_OTHER): Payer: Self-pay | Admitting: Primary Care

## 2021-11-05 VITALS — BP 137/84 | HR 86 | Temp 98.1°F | Ht 62.0 in | Wt 223.0 lb

## 2021-11-05 DIAGNOSIS — Z760 Encounter for issue of repeat prescription: Secondary | ICD-10-CM

## 2021-11-05 DIAGNOSIS — I10 Essential (primary) hypertension: Secondary | ICD-10-CM

## 2021-11-05 DIAGNOSIS — Z6841 Body Mass Index (BMI) 40.0 and over, adult: Secondary | ICD-10-CM

## 2021-11-05 MED ORDER — AMLODIPINE BESYLATE-VALSARTAN 10-320 MG PO TABS: 1.0000 | ORAL_TABLET | Freq: Every day | ORAL | 1 refills | Status: DC

## 2021-11-05 NOTE — Progress Notes (Signed)
[place]   [name] is a 46 y.o. female presents for hypertension evaluation, Denies shortness of breath, headaches, chest pain or lower extremity edema, sudden onset, vision changes, unilateral weakness, dizziness, paresthesias   Patient reports adherence with medications.  Dietary habits include: monitoring sodium and carbs  Exercise habits include: walking/in an nutrition programs  Family / Social history: Father -T2D, Mother HTN    Past Medical History:  Diagnosis Date   Anemia    Anxiety    Bartholin cyst    Cervical cancer ([place])    Chronic kidney disease    Constipation    Depression    Deviated septum    Diabetes mellitus without complication ([place])    Difficult intravenous access    used port for 05-09-2019 surgery   Fibroids    History of blood transfusion    2 units given 04-26-2019, has had total of 8 units since jan 2021   History of blood transfusion 1 unit given 05-30-19   iv fluids also given   History of kidney problems    History of radiation therapy 03/23/2019-05/04/2019   Cervical external beam   Dr Gery Pray   History of radiation therapy 05/09/2019-06/05/2019   vaginal brachytherapy   Dr Gery Pray   History of recent blood transfusion 05/16/2019   2 units given  per dr Pollyann Savoy at cancer center   Hypertension    Neuropathy    both thumbs   Obesity    Palpitations    PONV (postoperative nausea and vomiting)    Prediabetes    Preeclampsia 2006   Sleep apnea    no cpap used insurance would not cover, osa severe per pt   SOB (shortness of breath)    Swallowing difficulty    Past Surgical History:  Procedure Laterality Date   CESAREAN SECTION WITH BILATERAL TUBAL LIGATION  11/10/2004       DILATION AND CURETTAGE OF UTERUS  1997   IR IMAGING GUIDED PORT INSERTION  04/04/2019   IR REMOVAL TUN ACCESS W/ PORT W/O FL MOD SED  10/27/2021   OPERATIVE ULTRASOUND N/A 05/09/2019   Procedure: OPERATIVE ULTRASOUND;  Surgeon: Gery Pray, MD;  Location: [place];  Service: Urology;  Laterality: N/A;   OPERATIVE ULTRASOUND N/A 05/15/2019   Procedure: OPERATIVE ULTRASOUND;  Surgeon: Gery Pray, MD;  Location: [facility];  Service: Urology;  Laterality: N/A;   OPERATIVE ULTRASOUND N/A 05/22/2019   Procedure: OPERATIVE ULTRASOUND;  Surgeon: Gery Pray, MD;  Location: [facility];  Service: Urology;  Laterality: N/A;   OPERATIVE ULTRASOUND N/A 05/29/2019   Procedure: OPERATIVE ULTRASOUND;  Surgeon: Gery Pray, MD;  Location: [facility];  Service: Urology;  Laterality: N/A;   OPERATIVE ULTRASOUND N/A 06/05/2019   Procedure: OPERATIVE ULTRASOUND;  Surgeon: Gery Pray, MD;  Location: [facility];  Service: Urology;  Laterality: N/A;   TANDEM RING INSERTION N/A 05/09/2019   Procedure: TANDEM RING INSERTION;  Surgeon: Gery Pray, MD;  Location: [facility];  Service: Urology;  Laterality: N/A;   TANDEM RING INSERTION N/A 05/15/2019   Procedure: TANDEM RING INSERTION;  Surgeon: Gery Pray, MD;  Location: [facility];  Service: Urology;  Laterality: N/A;   TANDEM RING INSERTION N/A 05/22/2019   Procedure: TANDEM RING INSERTION;  Surgeon: Gery Pray, MD;  Location: [facility];  Service: Urology;  Laterality: N/A;   TANDEM RING INSERTION N/A 05/29/2019  Procedure: TANDEM RING INSERTION;  Surgeon: Gery Pray, MD;  Location: [facility];  Service: Urology;  Laterality: N/A;   TANDEM RING INSERTION N/A 06/05/2019   Procedure: TANDEM RING INSERTION;  Surgeon: Gery Pray, MD;  Location: [facility];  Service: Urology;  Laterality: N/A;   Allergies  Allergen Reactions   Penicillins     Not sure a child allergy   Shellfish Allergy Swelling    Seafood also any kind   Sulfa Antibiotics     Not sure a child allergy   Current Outpatient Medications on File Prior to Visit   Medication Sig Dispense Refill   Accu-Chek Softclix Lancets lancets Use to check blood sugar TID. 100 each 2   atorvastatin (LIPITOR) 10 MG tablet Take 1 tablet (10 mg total) by mouth daily. 90 tablet 0   Blood Glucose Monitoring Suppl (ACCU-CHEK GUIDE) w/Device KIT Use to check blood sugar TID. 1 kit 0   chlorhexidine (PERIDEX) 0.12 % solution SMARTSIG:By Mouth     conjugated estrogens (PREMARIN) vaginal cream Place 1 Applicatorful vaginally 3 (three) times a week. 42.5 g 1   fluticasone (FLONASE) 50 MCG/ACT nasal spray Place 1 spray into both nostrils daily as needed for allergies or rhinitis.     glucose blood (ACCU-CHEK GUIDE) test strip Use to check blood sugar TID. 100 each 2   Insulin Pen Needle (BD PEN NEEDLE NANO 2ND GEN) 32G X 4 MM MISC 1 Package by Does not apply route 2 (two) times daily. 100 each 0   lidocaine-prilocaine (EMLA) cream Apply 1 application topically as needed. Apply to port access daily as needed 30 g 3   Misc. Devices MISC CMP with GFR 1 ampule 0   mupirocin ointment (BACTROBAN) 2 % 1 application 3 (three) times daily.     Semaglutide,0.25 or 0.5MG/DOS, (OZEMPIC, 0.25 OR 0.5 MG/DOSE,) 2 MG/1.5ML SOPN Inject 0.5 mg into the skin once a week. 3 mL 0   No current facility-administered medications on file prior to visit.   Social History   Socioeconomic History   Marital status: Single    Spouse name: Not on file   Number of children: 4   Years of education: Not on file   Highest education level: Not on file  Occupational History   Not on file  Tobacco Use   Smoking status: Never   Smokeless tobacco: Never  Vaping Use   Vaping Use: Never used  Substance and Sexual Activity   Alcohol use: No   Drug use: No   Sexual activity: Not Currently    Birth control/protection: Surgical  Other Topics Concern   Not on file  Social History Narrative   Lives with her boyfriend   Social Determinants of Health   Financial Resource Strain: Not on file  Food  Insecurity: Not on file  Transportation Needs: Not on file  Physical Activity: Not on file  Stress: Not on file  Social Connections: Not on file  Intimate Partner Violence: Not on file   Family History  Problem Relation Age of Onset   Brain cancer Mother        lung cancer to brain   Hypertension Mother    Cancer Mother    Depression Mother    Anxiety disorder Mother    Diabetes Father    Kidney disease Father    Cancer Father        kidney cancer   Heart failure Sister      OBJECTIVE:  Vitals:  11/05/21 1333  BP: 137/84  Pulse: 86  Temp: 98.1 F (36.7 C)  TempSrc: Oral  SpO2: 95%  Weight: 223 lb (101.2 kg)  Height: 5' 2"  (1.575 m)    Physical exam: General: Vital signs reviewed.  Patient is well-developed and well-nourished, morbid obesity  in no acute distress and cooperative with exam. Head: Normocephalic and atraumatic. Eyes: EOMI, conjunctivae normal, no scleral icterus. Neck: Supple, trachea midline, normal ROM, no JVD, masses, thyromegaly, or carotid bruit present. Cardiovascular: RRR, S1 normal, S2 normal, no murmurs, gallops, or rubs. Pulmonary/Chest: Clear to auscultation bilaterally, no wheezes, rales, or rhonchi. Abdominal: Soft, non-tender, non-distended, BS +, no masses, organomegaly, or guarding present. Musculoskeletal: No joint deformities, erythema, or stiffness, ROM full and nontender. Extremities: No lower extremity edema bilaterally,  pulses symmetric and intact bilaterally. No cyanosis or clubbing. Neurological: A&O x3, Strength is normal Skin: Warm, dry and intact. No rashes or erythema. Psychiatric: Normal mood and affect. speech and behavior is normal. Cognition and memory are normal.     ROS Comprehensive ROS Pertinent positive and negative noted in HPI   Last 3 Office BP readings: BP Readings from Last 3 Encounters:  11/05/21 137/84  10/23/21 (!) 133/91  10/09/21 138/86    BMET    Component Value Date/Time   NA 138 10/23/2021  1218   NA 140 09/20/2019 1106   K 4.0 10/23/2021 1218   CL 106 10/23/2021 1218   CO2 27 10/23/2021 1218   GLUCOSE 186 (H) 10/23/2021 1218   BUN 22 (H) 10/23/2021 1218   BUN 20 09/20/2019 1106   CREATININE 1.21 (H) 10/23/2021 1218   CREATININE 1.44 (H) 12/12/2020 1108   CALCIUM 9.3 10/23/2021 1218   GFRNONAA 56 (L) 10/23/2021 1218   GFRNONAA 46 (L) 12/12/2020 1108   GFRAA 55 (L) 09/20/2019 1106    Renal function: Estimated Creatinine Clearance: 65.3 mL/min (A) (by C-G formula based on SCr of 1.21 mg/dL (H)).  Clinical ASCVD: No  The 10-year ASCVD risk score (Arnett DK, et al., 2019) is: 5.3%   Values used to calculate the score:     Age: 50 years     Sex: Female     Is Non-Hispanic African American: No     Diabetic: Yes     Tobacco smoker: No     Systolic Blood Pressure: 093 mmHg     Is BP treated: Yes     HDL Cholesterol: 37 mg/dL     Total Cholesterol: 221 mg/dL  ASCVD risk factors include- [place]   ASSESSMENT & PLAN: Tamara Osborne was seen today for hypertension.  Diagnoses and all orders for this visit:  Class 3 severe obesity with serious comorbidity and body mass index (BMI) of 40.0 to 44.9 in adult, unspecified obesity type ([place]), CURRENT BMI 39.9 Followed by weight management on Ozempic   -     Medication refill -     amLODipine-valsartan (EXFORGE) 10-320 MG tablet; Take 1 tablet by mouth daily.    Essential hypertension -Counseled on lifestyle modifications for blood pressure control including reduced dietary sodium, increased exercise, weight reduction and adequate sleep. Also, educated patient about the risk for cardiovascular events, stroke and heart attack. Also counseled patient about the importance of medication adherence. If you participate in smoking, it is important to stop using tobacco as this will increase the risks associated with uncontrolled blood pressure.  Goal BP:  For patients younger than 60: Goal BP < 130/80. For patients 60 and older: Goal BP  < 140/90.  For patients with diabetes: Goal BP < 130/80. Your most recent BP: 137/84  Minimize salt intake. Minimize alcohol intake    This note has been created with Surveyor, quantity. Any transcriptional errors are unintentional.   Kerin Perna, NP 11/05/2021, 1:48 PM

## 2021-11-06 ENCOUNTER — Encounter: Payer: Medicaid Other | Attending: Gynecologic Oncology | Admitting: Physical Therapy

## 2021-11-06 ENCOUNTER — Encounter: Payer: Self-pay | Admitting: Physical Therapy

## 2021-11-06 DIAGNOSIS — N393 Stress incontinence (female) (male): Secondary | ICD-10-CM | POA: Insufficient documentation

## 2021-11-06 DIAGNOSIS — M6281 Muscle weakness (generalized): Secondary | ICD-10-CM | POA: Diagnosis present

## 2021-11-06 DIAGNOSIS — Z483 Aftercare following surgery for neoplasm: Secondary | ICD-10-CM | POA: Diagnosis not present

## 2021-11-06 DIAGNOSIS — R102 Pelvic and perineal pain: Secondary | ICD-10-CM | POA: Insufficient documentation

## 2021-11-06 NOTE — Therapy (Signed)
OUTPATIENT PHYSICAL THERAPY TREATMENT NOTE   Patient Name: Tamara Osborne MRN: [id number] DOB:07-10-1975, 46 y.o., female Today's Date: 11/06/2021  PCP: Kerin Perna, NP REFERRING PROVIDER: [name], MD  END OF SESSION:   PT End of Session - 11/06/21 1131     Visit Number 5    Date for PT Re-Evaluation 12/30/21    Authorization Type [employer]    Authorization Time Period 10/07/2021-12/05/2021    Authorization - Visit Number 5    Authorization - Number of Visits 7    PT Start Time 1130    PT Stop Time 4287    PT Time Calculation (min) 45 min    Activity Tolerance Patient tolerated treatment well    Behavior During Therapy [facility] for tasks assessed/performed             Past Medical History:  Diagnosis Date   Anemia    Anxiety    Bartholin cyst    Cervical cancer ([place])    Chronic kidney disease    Constipation    Depression    Deviated septum    Diabetes mellitus without complication ([place] [place])    Difficult intravenous access    used port for 05-09-2019 surgery   Fibroids    History of blood transfusion    2 units given 04-26-2019, has had total of 8 units since jan 2021   History of blood transfusion 1 unit given 05-30-19   iv fluids also given   History of kidney problems    History of radiation therapy 03/23/2019-05/04/2019   Cervical external beam   Dr Gery Pray   History of radiation therapy 05/09/2019-06/05/2019   vaginal brachytherapy   Dr Gery Pray   History of recent blood transfusion 05/16/2019   2 units given  per dr Pollyann Savoy at cancer center   Hypertension    Neuropathy    both thumbs   Obesity    Palpitations    PONV (postoperative nausea and vomiting)    Prediabetes    Preeclampsia 2006   Sleep apnea    no cpap used insurance would not cover, osa severe per pt   SOB (shortness of breath)    Swallowing difficulty    Past Surgical History:  Procedure Laterality Date   CESAREAN SECTION WITH BILATERAL TUBAL LIGATION  11/10/2004        DILATION AND CURETTAGE OF UTERUS  1997   IR IMAGING GUIDED PORT INSERTION  04/04/2019   IR REMOVAL TUN ACCESS W/ PORT W/O FL MOD SED  10/27/2021   OPERATIVE ULTRASOUND N/A 05/09/2019   Procedure: OPERATIVE ULTRASOUND;  Surgeon: Gery Pray, MD;  Location: [place];  Service: Urology;  Laterality: N/A;   OPERATIVE ULTRASOUND N/A 05/15/2019   Procedure: OPERATIVE ULTRASOUND;  Surgeon: Gery Pray, MD;  Location: [facility];  Service: Urology;  Laterality: N/A;   OPERATIVE ULTRASOUND N/A 05/22/2019   Procedure: OPERATIVE ULTRASOUND;  Surgeon: Gery Pray, MD;  Location: [facility];  Service: Urology;  Laterality: N/A;   OPERATIVE ULTRASOUND N/A 05/29/2019   Procedure: OPERATIVE ULTRASOUND;  Surgeon: Gery Pray, MD;  Location: [facility];  Service: Urology;  Laterality: N/A;   OPERATIVE ULTRASOUND N/A 06/05/2019   Procedure: OPERATIVE ULTRASOUND;  Surgeon: Gery Pray, MD;  Location: [facility];  Service: Urology;  Laterality: N/A;   TANDEM RING INSERTION N/A 05/09/2019   Procedure: TANDEM RING INSERTION;  Surgeon: Gery Pray, MD;  Location: [facility];  Service: Urology;  Laterality: N/A;   TANDEM RING INSERTION N/A 05/15/2019   Procedure: TANDEM RING INSERTION;  Surgeon: Gery Pray, MD;  Location: [facility];  Service: Urology;  Laterality: N/A;   TANDEM RING INSERTION N/A 05/22/2019   Procedure: TANDEM RING INSERTION;  Surgeon: Gery Pray, MD;  Location: [facility];  Service: Urology;  Laterality: N/A;   TANDEM RING INSERTION N/A 05/29/2019   Procedure: TANDEM RING INSERTION;  Surgeon: Gery Pray, MD;  Location: [facility];  Service: Urology;  Laterality: N/A;   TANDEM RING INSERTION N/A 06/05/2019   Procedure: TANDEM RING INSERTION;  Surgeon: Gery Pray, MD;  Location: [facility];  Service: Urology;  Laterality: N/A;    Patient Active Problem List   Diagnosis Date Noted   Dyspareunia in female 10/23/2021   Type 2 diabetes mellitus with hyperglycemia, without long-term current use of insulin ([place]) 07/03/2021   Hyperlipidemia associated with type 2 diabetes mellitus ([place]) 07/03/2021   Type 2 diabetes mellitus without complication, without long-term current use of insulin ([place]) 11/15/2020   Incontinence of urine in female 09/09/2020   Onychomycosis of toenail 07/31/2020   OSA (obstructive sleep apnea) 07/31/2020   Prediabetes 05/27/2020   Sinus congestion 04/16/2020   Port-A-Cath in place 01/22/2020   Other hyperlipidemia 01/10/2020   Vitamin D deficiency 01/10/2020   Insulin resistance 01/10/2020   Class 3 severe obesity with serious comorbidity and body mass index (BMI) of 40.0 to 44.9 in adult ([place]) 01/10/2020   Obesity, Class II, BMI 35-39.9 09/07/2019   Dehydration 05/30/2019   Peripheral neuropathy due to chemotherapy ([place]) 04/25/2019   Nausea without vomiting 04/18/2019   Diarrhea 04/04/2019   CKD (chronic kidney disease), symptom management only, stage 3 (moderate) ([place]) 03/30/2019   Generalized anxiety disorder 03/30/2019   Hypertension associated with type 2 diabetes mellitus ([place])    Anxiety 03/23/2019   Squamous cell carcinoma of cervix ([place]) 03/23/2019   Deficiency anemia 03/22/2019   REFERRING DIAG:  C53.9 (ICD-10-CM) - Malignant neoplasm of cervix, unspecified site ([place])  N94.10 (ICD-10-CM) - Dyspareunia in female      THERAPY DIAG:  Muscle weakness (generalized)   Pelvic pain   Stress incontinence of urine   Aftercare following surgery for neoplasm   Rationale for Evaluation and Treatment Rehabilitation   ONSET DATE: 07/07/2021   SUBJECTIVE:                                                                                                                                                                                            SUBJECTIVE STATEMENT: I am feeling good. I  am staying with the green dilator. I have the pain like the dilator is too big. I ordered the handle for the dilator. I was sneezing the other day and I leaked urine. It only happens when I get the cough, sneezing or laughing uncontrollably. I can feel the urinary leakage and before I could not.    PAIN:  Are you having pain? Yes NPRS scale: 8/10 Pain location: Vaginal   Pain type: aching, burning, sharp, and tight Pain description: intermittent    Aggravating factors: penile penetration; using the larger dilator, vaginal exam Relieving factors: no vaginal penetration   PRECAUTIONS: Other: cancer   WEIGHT BEARING RESTRICTIONS No   FALLS:  Has patient fallen in last 6 months? No   LIVING ENVIRONMENT: Lives with: lives with their partner   OCCUPATION: part time   PLOF: Independent   PATIENT GOALS reduce pain with vaginal penetration   PERTINENT HISTORY:  Cervical cancer with radiation; diabetes, c-section     BOWEL MOVEMENT Pain with bowel movement: No   URINATION Pain with urination: No Fully empty bladder: Yes:   Stream:  average Urgency: Yes: some watches what she drinks when she goes for a car ride Frequency: can wait 2-3 hours Leakage: Coughing and Sneezing Pads: Yes: when in a car for a distance   INTERCOURSE Pain with intercourse: During Penetration, After Intercourse, and after gets a soreness Ability to have vaginal penetration:  Yes: not always comfortable to have the penis deeper Climax: not since her cancer Marinoff Scale: 2/3   PREGNANCY Vaginal deliveries 3 Tearing No C-section deliveries 1 Currently pregnant No       OBJECTIVE:    DIAGNOSTIC FINDINGS:  none   PATIENT SURVEYS:  PFIQ-7 62; UIQ-7 38, POPIQ-7 24   COGNITION:            Overall cognitive status: Within functional limits for tasks assessed                          SENSATION:            Light touch: Appears intact            Proprioception: Appears intact   MUSCLE  LENGTH: Hamstrings: Right decreased  deg; Left decreased deg Thomas test: Right positive deg; Left positive deg                  POSTURE: rounded shoulders, forward head, decreased lumbar lordosis, and increased abdominal size               PELVIC ALIGNMENT: correct alignment   LUMBARAROM/PROM   A/PROM A/PROM  eval  Flexion full  Extension Decreased by 50%  Right lateral flexion Decreased by 25%  Left lateral flexion Decreased by 25%  Right rotation Decreased by 25%  Left rotation Decreased by 25%   (Blank rows = not tested)   LOWER EXTREMITY ROM:   Active ROM Right eval Left eval  Hip extension 0 0   (Blank rows = not tested)   LOWER EXTREMITY MMT:   MMT Right eval Left eval  Hip extension 3+/5 3+/5  Hip abduction 3+/5 4/5     PALPATION:   General  tenderness suprapubically, bilaterally hip adductor by the groin,                  External Perineal Exam along the perineal body, ishiocavernosis, bulbocavernosus  Internal Pelvic Floor along the introitus, levator ani, obturator internist, side of the bladder and urethra, and at the vaginal vault. Therapist touched the vaginal vault with her index finger that Is 4 inches long   Patient confirms identification and approves PT to assess internal pelvic floor and treatment Yes   PELVIC MMT:   MMT eval 10/21/2021  Vaginal 2/5 but not hug of therapist finger 3/5 with weak lift and hug of therapist finger  (Blank rows = not tested)         TONE: increased   PROLAPSE: None noted   TODAY'S TREATMENT  11/06/2021 Manual: Internal pelvic floor techniques:No emotional/communication barriers or cognitive limitation. Patient is motivated to learn. Patient understands and agrees with treatment goals and plan. PT explains patient will be examined in standing, sitting, and lying down to see how their muscles and joints work. When they are ready, they will be asked to remove their underwear so PT can  examine their perineum. The patient is also given the option of providing their own chaperone as one is not provided in our facility. The patient also has the right and is explained the right to defer or refuse any part of the evaluation or treatment including the internal exam. With the patient's consent, PT will use one gloved finger to gently assess the muscles of the pelvic floor, seeing how well it contracts and relaxes and if there is muscle symmetry. After, the patient will get dressed and PT and patient will discuss exam findings and plan of care. PT and patient discuss plan of care, schedule, attendance policy and HEP activities.  Going through the vagina working on the urogenital diaphragm then working on the [employer] Worked on the anterior wall of the vaginal canal and along the cervix to work on the mobility of the tissue and lengthening the vaginal canal in supine and right sidely Neuromuscular re-education: Pelvic floor contraction training:tactile cues with therapist finger in the vaginal canal working on quick contractions and engagement of the lower abdomen Self-care: Educated patient on why she is not able to get the dilator all the way into the vaginal canal. The length of the vaginal canal is shorter due to the radiation and changes to the tissues. She is hitting the top of the vault and feeling the achiness.     10/28/2021 Manual: Soft tissue mobilization:To bilateral hip adductors, quadriceps, and hamstring Myofascial release:release of the urogenital diaphragm with one hand the hip adductors and along the pubic rim.  Internal pelvic floor techniques:No emotional/communication barriers or cognitive limitation. Patient is motivated to learn. Patient understands and agrees with treatment goals and plan. PT explains patient will be examined in standing, sitting, and lying down to see how their muscles and joints work. When they are ready, they will be asked to remove their underwear  so PT can examine their perineum. The patient is also given the option of providing their own chaperone as one is not provided in our facility. The patient also has the right and is explained the right to defer or refuse any part of the evaluation or treatment including the internal exam. With the patient's consent, PT will use one gloved finger to gently assess the muscles of the pelvic floor, seeing how well it contracts and relaxes and if there is muscle symmetry. After, the patient will get dressed and PT and patient will discuss exam findings and plan of care. PT and patient discuss plan of care, schedule, attendance policy and HEP  activities.  Going through the vaginal canal working on the vaginal vault, working on the right side of the vaginal canal. And releasing around the sides fo the bladder and above the bladder   10/21/2021 Manual: Soft tissue mobilization:manual work to bilateral hip adductor, quadriceps and hamstring Myofascial release:release to the urogenital diaphragm Internal pelvic floor techniques:No emotional/communication barriers or cognitive limitation. Patient is motivated to learn. Patient understands and agrees with treatment goals and plan. PT explains patient will be examined in standing, sitting, and lying down to see how their muscles and joints work. When they are ready, they will be asked to remove their underwear so PT can examine their perineum. The patient is also given the option of providing their own chaperone as one is not provided in our facility. The patient also has the right and is explained the right to defer or refuse any part of the evaluation or treatment including the internal exam. With the patient's consent, PT will use one gloved finger to gently assess the muscles of the pelvic floor, seeing how well it contracts and relaxes and if there is muscle symmetry. After, the patient will get dressed and PT and patient will discuss exam findings and plan of care. PT  and patient discuss plan of care, schedule, attendance policy and HEP activities.   Going through he vaginal canal work on the left side of the upper left canal working on the restrictions and elongation of the tissue, manual work to the left urogenital diaphragm with one finger internally and other externally, manual work to the left levator ani  Exercises: Stretches/mobility:prone extension 10x to increase lumbar and hip extension Strengthening: prone hip extension 10 x2 bil. Keeping hips on mat Pelvic floor contraction with tactile cues Therapeutic activities: Functional strengthening activities: Self-care: Discussed getting the ohnut and handle for dilators      PATIENT EDUCATION: 10/21/2021 Education details: Access Code: [email or web address] Person educated: Patient Education method: Explanation, Demonstration, Tactile cues, Verbal cues, and Handouts Education comprehension: verbalized understanding, returned demonstration, verbal cues required, tactile cues required, and needs further education     HOME EXERCISE PROGRAM: 10/21/2021  Access Code: [email or web address], talked about the ohnut and handle for the vaginal dilator URL: [email or web address] Date: 10/21/2021 Prepared by: Earlie Counts   Program Notes use the estrace cream  into the vaginal canal 2 times per week. place it in the canal with the applicator then use your finger to rub it in and the extra rub into the vulvar are. Use the good clean love daily on the vulva area.    Exercises - Hooklying Hamstring Stretch with Strap  - 1 x daily - 7 x weekly - 1 sets - 2 reps - 30 sec hold - Supine ITB Stretch with Strap  - 1 x daily - 7 x weekly - 1 sets - 2 reps - 30 sec hold - Supine Piriformis Stretch Pulling Heel to Hip  - 1 x daily - 7 x weekly - 1 sets - 2 reps - 30 sec hold - Prone Press Up  - 1 x daily - 7 x weekly - 1 sets - 10 reps - Prone Hip Extension with Pillow Under Abdomen  - 1 x daily - 3 x weekly - 2 sets - 10  reps ASSESSMENT:   CLINICAL IMPRESSION: Patient is a 46 y.o. female who was seen today for physical therapy  treatment for Dyspareunia.  Patient is able to feel the urinary leakage and before she was not able to.  Patient has difficulty with quick contraction of the pelvic floor and using the lower abdominals. She ahs restrictions on the anterior vaginal wall and around the cervix from the radiation that shortens the vaginal canal. She feel the pain and schiness when she fully inserts the vaginal dilator due to stretching the canal. Patient will benefit from skilled therapy to reduce pelvic pain, improve tissue extensibility for the radiation and reduce urinary leakage.      OBJECTIVE IMPAIRMENTS decreased activity tolerance, decreased coordination, decreased endurance, decreased strength, increased fascial restrictions, increased muscle spasms, impaired flexibility, and pain.    ACTIVITY LIMITATIONS bending, standing, continence, toileting, and vaginal exam   PARTICIPATION LIMITATIONS: interpersonal relationship, shopping, and community activity   PERSONAL FACTORS Cervical cancer with radiation; diabetes, c-section are also affecting patient's functional outcome.    REHAB POTENTIAL: Good   CLINICAL DECISION MAKING: Evolving/moderate complexity   EVALUATION COMPLEXITY: Moderate     GOALS: Goals reviewed with patient? Yes   SHORT TERM GOALS: Target date: 11/04/2021   Patient understand how to use moisturizers to the vaginal canal to improve the quality of the tissue.  Baseline:not educated yet Goal status: met 10/14/2021  2.  Patient educated on Ohnut to use with her dilator to make it easier to insert and relax with it in the canal.  Baseline: Not educated yet Goal status: INITIAL   3.  Instructed on how to use the handle for the dilator to place it in the vaginal canal.  Baseline: not educated yet Goal status: INITIAL   LONG TERM GOALS: Target date: 12/30/2021    Patient is  independent with her advanced HEP for pelvic floor stretches, hip strength and pelvic floor coordination.  Baseline: Not educated yet Goal status: INITIAL   2.  Patient able to have vaginal penetration with pain level </= 2-3/10 due to improved mobility of the tissue and accommodations using a device or different positions.  Baseline: pain level 8/10 Goal status: INITIAL   3.  Patient able to use the largest dilator with pain </= 1-2/10 to continue to remodel the fibrotic tissue of the vaginal wall.  Baseline: pain level 8/10 and on the third dilator Goal status: INITIAL   4.  Urinary leakage decreased >/= 70% due to the ability to fully relax and fully contract the pelvic floor and strength >/= 3/5 Baseline: urinary leakage with coughing and sneezing and strength is 2/5 without hug of therapist finger.  Goal status: INITIAL     PLAN: PT FREQUENCY: 1x/week   PT DURATION: 12 weeks   PLANNED INTERVENTIONS: Therapeutic exercises, Therapeutic activity, Neuromuscular re-education, Patient/Family education, Self Care, Joint mobilization, Dry Needling, Electrical stimulation, Spinal mobilization, Taping, Biofeedback, and Manual therapy   PLAN FOR NEXT SESSION: urogenital release, manual work to the thighs, internal work on the right side, work on pelvic floor contraction Earlie Counts, PT 11/06/21 12:27 PM

## 2021-11-10 ENCOUNTER — Ambulatory Visit (INDEPENDENT_AMBULATORY_CARE_PROVIDER_SITE_OTHER): Payer: Medicaid Other | Admitting: Family Medicine

## 2021-11-10 ENCOUNTER — Encounter (INDEPENDENT_AMBULATORY_CARE_PROVIDER_SITE_OTHER): Payer: Self-pay | Admitting: Family Medicine

## 2021-11-10 VITALS — BP 131/80 | HR 81 | Temp 97.9°F | Ht 62.0 in | Wt 220.0 lb

## 2021-11-10 DIAGNOSIS — E669 Obesity, unspecified: Secondary | ICD-10-CM | POA: Diagnosis not present

## 2021-11-10 DIAGNOSIS — E1169 Type 2 diabetes mellitus with other specified complication: Secondary | ICD-10-CM

## 2021-11-10 DIAGNOSIS — Z6839 Body mass index (BMI) 39.0-39.9, adult: Secondary | ICD-10-CM

## 2021-11-10 DIAGNOSIS — E785 Hyperlipidemia, unspecified: Secondary | ICD-10-CM

## 2021-11-10 DIAGNOSIS — E1165 Type 2 diabetes mellitus with hyperglycemia: Secondary | ICD-10-CM | POA: Diagnosis not present

## 2021-11-10 DIAGNOSIS — Z6841 Body Mass Index (BMI) 40.0 and over, adult: Secondary | ICD-10-CM

## 2021-11-10 DIAGNOSIS — Z7985 Long-term (current) use of injectable non-insulin antidiabetic drugs: Secondary | ICD-10-CM

## 2021-11-10 MED ORDER — OZEMPIC (0.25 OR 0.5 MG/DOSE) 2 MG/1.5ML ~~LOC~~ SOPN: 0.5000 mg | PEN_INJECTOR | SUBCUTANEOUS | 0 refills | Status: DC

## 2021-11-11 NOTE — Progress Notes (Signed)
Chief Complaint:   OBESITY Tamara Osborne is here to discuss her progress with her obesity treatment plan along with follow-up of her obesity related diagnoses. Tamara Osborne is on the Category 3 Plan and states she is following her eating plan approximately 90% of the time. Tamara Osborne states she is exercising 0 minutes 0 times per week.  Today's visit was #: 20 Starting weight: 208 lbs Starting date: 09/20/2019 Today's weight: 220 lbs Today's date: 11/10/2021 Total lbs lost to date: 0 lbs Total lbs lost since last in-office visit: 0  Interim History: Tamara Osborne got port out last week and see can not swim and was told to minimal activity for 2 weeks. Did not eat much the day she got her port out but otherwise has been pretty on plan. 3 eggs in, toast this am. Last night 3 chicken breast, thin slices cheese x 2 sandwiches. May do a bit of greek yogurt or cottage cheese as a pre dinner snack. Using [place] thins of snack calories.  Subjective:   1. Type 2 diabetes mellitus with hyperglycemia, without long-term current use of insulin (HCC) Tamara Osborne on Ozempic 0.5 mg. Denies GI side effects. Last A1c 5.4. No significant carb cravings.  2. Hyperlipidemia associated with type 2 diabetes mellitus ([place]) Tamara Osborne on Lipitor with no myalgias or transaminitis.  Assessment/Plan:   1. Type 2 diabetes mellitus with hyperglycemia, without long-term current use of insulin (HCC) We refill Ozempic 0.5 mg SubQ once a week for 1 month with 0 refills.  -Refill Semaglutide,0.25 or 0.'5MG'$ /DOS, (OZEMPIC, 0.25 OR 0.5 MG/DOSE,) 2 MG/1.5ML SOPN; Inject 0.5 mg into the skin once a week.  Dispense: 3 mL; Refill: 0  2. Hyperlipidemia associated with type 2 diabetes mellitus ([place]) Continue Lipitor with no changes in dose.  3. Obesity with current BMI of 40.4 Tamara Osborne is currently in the action stage of change. As such, her goal is to continue with weight loss efforts. She has agreed to the Category 3 Plan.   Exercise  goals: All adults should avoid inactivity. Some physical activity is better than none, and adults who participate in any amount of physical activity gain some health benefits.  Behavioral modification strategies: increasing lean protein intake, meal planning and cooking strategies, and keeping healthy foods in the home.  Tamara Osborne has agreed to follow-up with our clinic in 4 weeks. She was informed of the importance of frequent follow-up visits to maximize her success with intensive lifestyle modifications for her multiple health conditions.   Objective:   Blood pressure 131/80, pulse 81, temperature 97.9 F (36.6 C), height '5\' 2"'$  (1.575 m), weight 220 lb (99.8 kg), SpO2 97 %. Body mass index is 40.24 kg/m.  General: Cooperative, alert, well developed, in no acute distress. HEENT: Conjunctivae and lids unremarkable. Cardiovascular: Regular rhythm.  Lungs: Normal work of breathing. Neurologic: No focal deficits.   Lab Results  Component Value Date   CREATININE 1.21 (H) 10/23/2021   BUN 22 (H) 10/23/2021   NA 138 10/23/2021   K 4.0 10/23/2021   CL 106 10/23/2021   CO2 27 10/23/2021   Lab Results  Component Value Date   ALT 33 10/23/2021   AST 18 10/23/2021   ALKPHOS 84 10/23/2021   BILITOT 0.4 10/23/2021   Lab Results  Component Value Date   HGBA1C 5.4 06/05/2021   HGBA1C 5.2 01/13/2021   HGBA1C 7.5 (H) 10/18/2020   HGBA1C 6.0 (H) 05/20/2020   HGBA1C 5.2 10/30/2019   Lab Results  Component Value Date  INSULIN 40.3 (H) 09/20/2019   Lab Results  Component Value Date   TSH 1.910 09/20/2019   Lab Results  Component Value Date   CHOL 221 (H) 06/05/2021   HDL 37 (L) 06/05/2021   LDLCALC 158 (H) 06/05/2021   TRIG 130 06/05/2021   CHOLHDL 6.0 06/05/2021   Lab Results  Component Value Date   VD25OH 66.26 06/05/2021   VD25OH 59.37 10/18/2020   VD25OH 45.60 05/20/2020   Lab Results  Component Value Date   WBC 7.9 10/23/2021   HGB 13.6 10/23/2021   HCT 38.6  10/23/2021   MCV 79.8 (L) 10/23/2021   PLT 278 10/23/2021   Lab Results  Component Value Date   IRON 17 (L) 03/27/2019   TIBC 283 03/27/2019   FERRITIN 32 03/27/2019   Attestation Statements:   Reviewed by clinician on day of visit: allergies, medications, problem list, medical history, surgical history, family history, social history, and previous encounter notes.  I, Elnora Morrison, RMA am acting as transcriptionist for Coralie Common, MD.  I have reviewed the above documentation for accuracy and completeness, and I agree with the above. - Coralie Common, MD

## 2021-11-20 ENCOUNTER — Encounter: Payer: Self-pay | Admitting: Physical Therapy

## 2021-11-20 ENCOUNTER — Encounter: Payer: Medicaid Other | Admitting: Physical Therapy

## 2021-11-20 DIAGNOSIS — N393 Stress incontinence (female) (male): Secondary | ICD-10-CM

## 2021-11-20 DIAGNOSIS — M6281 Muscle weakness (generalized): Secondary | ICD-10-CM | POA: Diagnosis not present

## 2021-11-20 DIAGNOSIS — Z483 Aftercare following surgery for neoplasm: Secondary | ICD-10-CM

## 2021-11-20 DIAGNOSIS — R102 Pelvic and perineal pain: Secondary | ICD-10-CM

## 2021-11-20 NOTE — Therapy (Signed)
OUTPATIENT PHYSICAL THERAPY TREATMENT NOTE   Patient Name: Tamara Osborne MRN: [id number] DOB:1976/02/12, 46 y.o., female Today's Date: 11/20/2021  PCP: Kerin Perna, NP REFERRING PROVIDER: [name], MD  END OF SESSION:   PT End of Session - 11/20/21 1157     Visit Number 6    Date for PT Re-Evaluation 12/30/21    Authorization Type [employer]    Authorization Time Period 10/07/2021-12/05/2021    Authorization - Visit Number 6    Authorization - Number of Visits 7    PT Start Time 1130    PT Stop Time 8563    PT Time Calculation (min) 54 min    Activity Tolerance Patient tolerated treatment well    Behavior During Therapy WFL for tasks assessed/performed             Past Medical History:  Diagnosis Date   Anemia    Anxiety    Bartholin cyst    Cervical cancer ([place])    Chronic kidney disease    Constipation    Depression    Deviated septum    Diabetes mellitus without complication ([place])    Difficult intravenous access    used port for 05-09-2019 surgery   Fibroids    History of blood transfusion    2 units given 04-26-2019, has had total of 8 units since jan 2021   History of blood transfusion 1 unit given 05-30-19   iv fluids also given   History of kidney problems    History of radiation therapy 03/23/2019-05/04/2019   Cervical external beam   Dr Gery Pray   History of radiation therapy 05/09/2019-06/05/2019   vaginal brachytherapy   Dr Gery Pray   History of recent blood transfusion 05/16/2019   2 units given  per dr Pollyann Savoy at cancer center   Hypertension    Neuropathy    both thumbs   Obesity    Palpitations    PONV (postoperative nausea and vomiting)    Prediabetes    Preeclampsia 2006   Sleep apnea    no cpap used insurance would not cover, osa severe per pt   SOB (shortness of breath)    Swallowing difficulty    Past Surgical History:  Procedure Laterality Date   CESAREAN SECTION WITH BILATERAL TUBAL LIGATION  11/10/2004        DILATION AND CURETTAGE OF UTERUS  1997   IR IMAGING GUIDED PORT INSERTION  04/04/2019   IR REMOVAL TUN ACCESS W/ PORT W/O FL MOD SED  10/27/2021   OPERATIVE ULTRASOUND N/A 05/09/2019   Procedure: OPERATIVE ULTRASOUND;  Surgeon: Gery Pray, MD;  Location: [facility];  Service: Urology;  Laterality: N/A;   OPERATIVE ULTRASOUND N/A 05/15/2019   Procedure: OPERATIVE ULTRASOUND;  Surgeon: Gery Pray, MD;  Location: [facility];  Service: Urology;  Laterality: N/A;   OPERATIVE ULTRASOUND N/A 05/22/2019   Procedure: OPERATIVE ULTRASOUND;  Surgeon: Gery Pray, MD;  Location: [facility];  Service: Urology;  Laterality: N/A;   OPERATIVE ULTRASOUND N/A 05/29/2019   Procedure: OPERATIVE ULTRASOUND;  Surgeon: Gery Pray, MD;  [facility]  Service: Urology;  Laterality: N/A;   OPERATIVE ULTRASOUND N/A 06/05/2019   Procedure: OPERATIVE ULTRASOUND;  Surgeon: Gery Pray, MD;  Location: [facility];  Service: Urology;  Laterality: N/A;   TANDEM RING INSERTION N/A 05/09/2019   Procedure: TANDEM RING INSERTION;  Surgeon: Gery Pray, MD;  Location: [facility];  Service: Urology;  Laterality: N/A;   TANDEM RING INSERTION N/A 05/15/2019   Procedure: TANDEM RING INSERTION;  Surgeon: Gery Pray, MD;  Location: [facility];  Service: Urology;  Laterality: N/A;   TANDEM RING INSERTION N/A 05/22/2019   Procedure: TANDEM RING INSERTION;  Surgeon: Gery Pray, MD;  Location: [facility];  Service: Urology;  Laterality: N/A;   TANDEM RING INSERTION N/A 05/29/2019   Procedure: TANDEM RING INSERTION;  Surgeon: Gery Pray, MD;  Location: [facility];  Service: Urology;  Laterality: N/A;   TANDEM RING INSERTION N/A 06/05/2019   Procedure: TANDEM RING INSERTION;  Surgeon: Gery Pray, MD;  Location[facility];  Service: Urology;  Laterality: N/A;    Patient Active Problem List   Diagnosis Date Noted   Dyspareunia in female 10/23/2021   Type 2 diabetes mellitus with hyperglycemia, without long-term current use of insulin ([place]) 07/03/2021   Hyperlipidemia associated with type 2 diabetes mellitus ([place]) 07/03/2021   Type 2 diabetes mellitus without complication, without long-term current use of insulin ([place]) 11/15/2020   Incontinence of urine in female 09/09/2020   Onychomycosis of toenail 07/31/2020   OSA (obstructive sleep apnea) 07/31/2020   Prediabetes 05/27/2020   Sinus congestion 04/16/2020   Port-A-Cath in place 01/22/2020   Other hyperlipidemia 01/10/2020   Vitamin D deficiency 01/10/2020   Insulin resistance 01/10/2020   Class 3 severe obesity with serious comorbidity and body mass index (BMI) of 40.0 to 44.9 in adult ([place]) 01/10/2020   Obesity, Class II, BMI 35-39.9 09/07/2019   Dehydration 05/30/2019   Peripheral neuropathy due to chemotherapy ([place]) 04/25/2019   Nausea without vomiting 04/18/2019   Diarrhea 04/04/2019   CKD (chronic kidney disease), symptom management only, stage 3 (moderate) ([place]) 03/30/2019   Generalized anxiety disorder 03/30/2019   Hypertension associated with type 2 diabetes mellitus ([place])    Anxiety 03/23/2019   Squamous cell carcinoma of cervix ([place]) 03/23/2019   Deficiency anemia 03/22/2019   REFERRING DIAG:  C53.9 (ICD-10-CM) - Malignant neoplasm of cervix, unspecified site ([place])  N94.10 (ICD-10-CM) - Dyspareunia in female      THERAPY DIAG:  Muscle weakness (generalized)   Pelvic pain   Stress incontinence of urine   Aftercare following surgery for neoplasm   Rationale for Evaluation and Treatment Rehabilitation   ONSET DATE: 07/07/2021   SUBJECTIVE:                                                                                                                                                                                            SUBJECTIVE STATEMENT: I am using the  dilator. The estrogen cream is helping and the vaginal moisturizer. My vaginal tissue is not pinching now. Patient has not had vaginal penetration yet.    PAIN:  Are you having pain? Yes NPRS scale: 8/10 Pain location: Vaginal   Pain type: aching, burning, sharp, and tight Pain description: intermittent    Aggravating factors: penile penetration; using the larger dilator, vaginal exam Relieving factors: no vaginal penetration   PRECAUTIONS: Other: cancer   WEIGHT BEARING RESTRICTIONS No   FALLS:  Has patient fallen in last 6 months? No   LIVING ENVIRONMENT: Lives with: lives with their partner   OCCUPATION: part time   PLOF: Independent   PATIENT GOALS reduce pain with vaginal penetration   PERTINENT HISTORY:  Cervical cancer with radiation; diabetes, c-section     BOWEL MOVEMENT Pain with bowel movement: No   URINATION Pain with urination: No Fully empty bladder: Yes:   Stream:  average Urgency: Yes: some watches what she drinks when she goes for a car ride Frequency: can wait 2-3 hours Leakage: Coughing and Sneezing Pads: Yes: when in a car for a distance   INTERCOURSE Pain with intercourse: During Penetration, After Intercourse, and after gets a soreness Ability to have vaginal penetration:  Yes: not always comfortable to have the penis deeper Climax: not since her cancer Marinoff Scale: 2/3   PREGNANCY Vaginal deliveries 3 Tearing No C-section deliveries 1 Currently pregnant No       OBJECTIVE:    DIAGNOSTIC FINDINGS:  none   PATIENT SURVEYS:  PFIQ-7 62; UIQ-7 38, POPIQ-7 24   COGNITION:            Overall cognitive status: Within functional limits for tasks assessed                          SENSATION:            Light touch: Appears intact            Proprioception: Appears intact   MUSCLE LENGTH: Hamstrings: Right decreased  deg; Left decreased deg Thomas test: Right positive deg; Left positive deg                  POSTURE: rounded  shoulders, forward head, decreased lumbar lordosis, and increased abdominal size               PELVIC ALIGNMENT: correct alignment   LUMBARAROM/PROM   A/PROM A/PROM  eval  Flexion full  Extension Decreased by 50%  Right lateral flexion Decreased by 25%  Left lateral flexion Decreased by 25%  Right rotation Decreased by 25%  Left rotation Decreased by 25%   (Blank rows = not tested)   LOWER EXTREMITY ROM:   Active ROM Right eval Left eval  Hip extension 0 0   (Blank rows = not tested)   LOWER EXTREMITY MMT:   MMT Right eval Left eval  Hip extension 3+/5 3+/5  Hip abduction 3+/5 4/5     PALPATION:   General  tenderness suprapubically, bilaterally hip adductor by the groin,                  External Perineal Exam along the perineal body, ishiocavernosis, bulbocavernosus                             Internal Pelvic Floor along the introitus, levator ani, obturator internist, side of the bladder and urethra, and at  the vaginal vault. Therapist touched the vaginal vault with her index finger that Is 4 inches long   Patient confirms identification and approves PT to assess internal pelvic floor and treatment Yes   PELVIC MMT:   MMT eval 10/21/2021 11/20/2021  Vaginal 2/5 but not hug of therapist finger 3/5 with weak lift and hug of therapist finger 3/5  with circular weak hug  (Blank rows = not tested)         TONE: increased   PROLAPSE: None noted   TODAY'S TREATMENT  11/20/2021 Manual: Internal pelvic floor techniques:No emotional/communication barriers or cognitive limitation. Patient is motivated to learn. Patient understands and agrees with treatment goals and plan. PT explains patient will be examined in standing, sitting, and lying down to see how their muscles and joints work. When they are ready, they will be asked to remove their underwear so PT can examine their perineum. The patient is also given the option of providing their own chaperone as one is not  provided in our facility. The patient also has the right and is explained the right to defer or refuse any part of the evaluation or treatment including the internal exam. With the patient's consent, PT will use one gloved finger to gently assess the muscles of the pelvic floor, seeing how well it contracts and relaxes and if there is muscle symmetry. After, the patient will get dressed and PT and patient will discuss exam findings and plan of care. PT and patient discuss plan of care, schedule, attendance policy and HEP activities.  Going through the vagina working on the introitus, around the bladder along the anterior wall of the vaginal canal, working around the cervix to lengthen the vaginal canal, working along the levator ani and obturator internist     11/06/2021 Manual: Internal pelvic floor techniques:No emotional/communication barriers or cognitive limitation. Patient is motivated to learn. Patient understands and agrees with treatment goals and plan. PT explains patient will be examined in standing, sitting, and lying down to see how their muscles and joints work. When they are ready, they will be asked to remove their underwear so PT can examine their perineum. The patient is also given the option of providing their own chaperone as one is not provided in our facility. The patient also has the right and is explained the right to defer or refuse any part of the evaluation or treatment including the internal exam. With the patient's consent, PT will use one gloved finger to gently assess the muscles of the pelvic floor, seeing how well it contracts and relaxes and if there is muscle symmetry. After, the patient will get dressed and PT and patient will discuss exam findings and plan of care. PT and patient discuss plan of care, schedule, attendance policy and HEP activities.  Going through the vagina working on the urogenital diaphragm then working on the [employer] Worked on the anterior wall of the  vaginal canal and along the cervix to work on the mobility of the tissue and lengthening the vaginal canal in supine and right sidely Neuromuscular re-education: Pelvic floor contraction training:tactile cues with therapist finger in the vaginal canal working on quick contractions and engagement of the lower abdomen Self-care: Educated patient on why she is not able to get the dilator all the way into the vaginal canal. The length of the vaginal canal is shorter due to the radiation and changes to the tissues. She is hitting the top of the vault and feeling the achiness.  10/28/2021 Manual: Soft tissue mobilization:To bilateral hip adductors, quadriceps, and hamstring Myofascial release:release of the urogenital diaphragm with one hand the hip adductors and along the pubic rim.  Internal pelvic floor techniques:No emotional/communication barriers or cognitive limitation. Patient is motivated to learn. Patient understands and agrees with treatment goals and plan. PT explains patient will be examined in standing, sitting, and lying down to see how their muscles and joints work. When they are ready, they will be asked to remove their underwear so PT can examine their perineum. The patient is also given the option of providing their own chaperone as one is not provided in our facility. The patient also has the right and is explained the right to defer or refuse any part of the evaluation or treatment including the internal exam. With the patient's consent, PT will use one gloved finger to gently assess the muscles of the pelvic floor, seeing how well it contracts and relaxes and if there is muscle symmetry. After, the patient will get dressed and PT and patient will discuss exam findings and plan of care. PT and patient discuss plan of care, schedule, attendance policy and HEP activities.  Going through the vaginal canal working on the vaginal vault, working on the right side of the vaginal canal. And  releasing around the sides fo the bladder and above the bladder        PATIENT EDUCATION: 10/21/2021 Education details: Access Code: [email or web address] Person educated: Patient Education method: Explanation, Demonstration, Tactile cues, Verbal cues, and Handouts Education comprehension: verbalized understanding, returned demonstration, verbal cues required, tactile cues required, and needs further education     HOME EXERCISE PROGRAM: 10/21/2021  Access Code: [email or web address], talked about the ohnut and handle for the vaginal dilator URL: [email or web address] Date: 10/21/2021 Prepared by: Earlie Counts   Program Notes use the estrace cream  into the vaginal canal 2 times per week. place it in the canal with the applicator then use your finger to rub it in and the extra rub into the vulvar are. Use the good clean love daily on the vulva area.    Exercises - Hooklying Hamstring Stretch with Strap  - 1 x daily - 7 x weekly - 1 sets - 2 reps - 30 sec hold - Supine ITB Stretch with Strap  - 1 x daily - 7 x weekly - 1 sets - 2 reps - 30 sec hold - Supine Piriformis Stretch Pulling Heel to Hip  - 1 x daily - 7 x weekly - 1 sets - 2 reps - 30 sec hold - Prone Press Up  - 1 x daily - 7 x weekly - 1 sets - 10 reps - Prone Hip Extension with Pillow Under Abdomen  - 1 x daily - 3 x weekly - 2 sets - 10 reps ASSESSMENT:   CLINICAL IMPRESSION: Patient is a 46 y.o. female who was seen today for physical therapy  treatment for Dyspareunia.  Patient is able to feel the urinary leakage .  Patient has difficulty with quick contraction of the pelvic floor and using the lower abdominals. She ahs restrictions on the anterior vaginal wall and around the cervix from the radiation that shortens the vaginal canal. Patient is not feeling the pinching when using the dilator due to the health of the tissue is improving with the vaginal estrogen cream and vaginal moisturizers. Therapist was able to place her index finger  into the vaginal canla 6 inches. The furthest it has been. . Patient will benefit from  skilled therapy to reduce pelvic pain, improve tissue extensibility for the radiation and reduce urinary leakage.      OBJECTIVE IMPAIRMENTS decreased activity tolerance, decreased coordination, decreased endurance, decreased strength, increased fascial restrictions, increased muscle spasms, impaired flexibility, and pain.    ACTIVITY LIMITATIONS bending, standing, continence, toileting, and vaginal exam   PARTICIPATION LIMITATIONS: interpersonal relationship, shopping, and community activity   PERSONAL FACTORS Cervical cancer with radiation; diabetes, c-section are also affecting patient's functional outcome.    REHAB POTENTIAL: Good   CLINICAL DECISION MAKING: Evolving/moderate complexity   EVALUATION COMPLEXITY: Moderate     GOALS: Goals reviewed with patient? Yes   SHORT TERM GOALS: Target date: 11/04/2021   Patient understand how to use moisturizers to the vaginal canal to improve the quality of the tissue.  Baseline:not educated yet Goal status: met 10/14/2021  2.  Patient educated on Ohnut to use with her dilator to make it easier to insert and relax with it in the canal.  Baseline: Not educated yet Goal status: INITIAL   3.  Instructed on how to use the handle for the dilator to place it in the vaginal canal.  Baseline: not educated yet Goal status: INITIAL   LONG TERM GOALS: Target date: 12/30/2021    Patient is independent with her advanced HEP for pelvic floor stretches, hip strength and pelvic floor coordination.  Baseline: Not educated yet Goal status: INITIAL   2.  Patient able to have vaginal penetration with pain level </= 2-3/10 due to improved mobility of the tissue and accommodations using a device or different positions.  Baseline: pain level 8/10 Goal status: INITIAL   3.  Patient able to use the largest dilator with pain </= 1-2/10 to continue to remodel the fibrotic  tissue of the vaginal wall.  Baseline: pain level 8/10 and on the third dilator Goal status: INITIAL   4.  Urinary leakage decreased >/= 70% due to the ability to fully relax and fully contract the pelvic floor and strength >/= 3/5 Baseline: urinary leakage with coughing and sneezing and strength is 2/5 without hug of therapist finger.  Goal status: INITIAL     PLAN: PT FREQUENCY: 1x/week   PT DURATION: 12 weeks   PLANNED INTERVENTIONS: Therapeutic exercises, Therapeutic activity, Neuromuscular re-education, Patient/Family education, Self Care, Joint mobilization, Dry Needling, Electrical stimulation, Spinal mobilization, Taping, Biofeedback, and Manual therapy   PLAN FOR NEXT SESSION: urogenital release, manual work to the thighs, internal work on the right side, work on pelvic floor contraction; put in [place] in 2 visits  Earlie Counts, PT 11/20/21 12:37 PM

## 2021-11-27 ENCOUNTER — Encounter: Payer: Medicaid Other | Admitting: Physical Therapy

## 2021-11-27 ENCOUNTER — Encounter: Payer: Self-pay | Admitting: Physical Therapy

## 2021-11-27 DIAGNOSIS — M6281 Muscle weakness (generalized): Secondary | ICD-10-CM

## 2021-11-27 DIAGNOSIS — N393 Stress incontinence (female) (male): Secondary | ICD-10-CM

## 2021-11-27 DIAGNOSIS — R102 Pelvic and perineal pain: Secondary | ICD-10-CM

## 2021-11-27 NOTE — Therapy (Signed)
OUTPATIENT PHYSICAL THERAPY TREATMENT NOTE   Patient Name: Tamara Osborne MRN: [id number] DOB:Nov 11, 1975, 46 y.o., female Today's Date: 11/27/2021  PCP: Kerin Perna, NP REFERRING PROVIDER: [name], MD  END OF SESSION:   PT End of Session - 11/27/21 1130     Visit Number 7    Date for PT Re-Evaluation 12/30/21    Authorization Type [employer]    Authorization Time Period 10/07/2021-12/05/2021    Authorization - Visit Number 6    Authorization - Number of Visits 7    PT Start Time 1130    PT Stop Time 1210    PT Time Calculation (min) 40 min    Activity Tolerance Patient tolerated treatment well    Behavior During Therapy [facility] for tasks assessed/performed             Past Medical History:  Diagnosis Date   Anemia    Anxiety    Bartholin cyst    Cervical cancer ([place])    Chronic kidney disease    Constipation    Depression    Deviated septum    Diabetes mellitus without complication ([place])    Difficult intravenous access    used port for 05-09-2019 surgery   Fibroids    History of blood transfusion    2 units given 04-26-2019, has had total of 8 units since jan 2021   History of blood transfusion 1 unit given 05-30-19   iv fluids also given   History of kidney problems    History of radiation therapy 03/23/2019-05/04/2019   Cervical external beam   Dr Gery Pray   History of radiation therapy 05/09/2019-06/05/2019   vaginal brachytherapy   Dr Gery Pray   History of recent blood transfusion 05/16/2019   2 units given  per dr Pollyann Savoy at cancer center   Hypertension    Neuropathy    both thumbs   Obesity    Palpitations    PONV (postoperative nausea and vomiting)    Prediabetes    Preeclampsia 2006   Sleep apnea    no cpap used insurance would not cover, osa severe per pt   SOB (shortness of breath)    Swallowing difficulty    Past Surgical History:  Procedure Laterality Date   CESAREAN SECTION WITH BILATERAL TUBAL LIGATION  11/10/2004        DILATION AND CURETTAGE OF UTERUS  1997   IR IMAGING GUIDED PORT INSERTION  04/04/2019   IR REMOVAL TUN ACCESS W/ PORT W/O FL MOD SED  10/27/2021   OPERATIVE ULTRASOUND N/A 05/09/2019   Procedure: OPERATIVE ULTRASOUND;  Surgeon: Gery Pray, MD;  Location: [facility];  Service: Urology;  Laterality: N/A;   OPERATIVE ULTRASOUND N/A 05/15/2019   Procedure: OPERATIVE ULTRASOUND;  Surgeon: Gery Pray, MD;  Location: [facility];  Service: Urology;  Laterality: N/A;   OPERATIVE ULTRASOUND N/A 05/22/2019   Procedure: OPERATIVE ULTRASOUND;  Surgeon: Gery Pray, MD;  Location: [facility];  Service: Urology;  Laterality: N/A;   OPERATIVE ULTRASOUND N/A 05/29/2019   Procedure: OPERATIVE ULTRASOUND;  Surgeon: Gery Pray, MD;  Location: [facility];  Service: Urology;  Laterality: N/A;   OPERATIVE ULTRASOUND N/A 06/05/2019   Procedure: OPERATIVE ULTRASOUND;  Surgeon: Gery Pray, MD;  Location: [facility];  Service: Urology;  Laterality: N/A;   TANDEM RING INSERTION N/A 05/09/2019   Procedure: TANDEM RING INSERTION;  Surgeon: Gery Pray, MD;  Location: [facility];  Service: Urology;  Laterality: N/A;   TANDEM RING INSERTION N/A 05/15/2019   Procedure: TANDEM RING INSERTION;  Surgeon: Gery Pray, MD;  Location: [facility];  Service: Urology;  Laterality: N/A;   TANDEM RING INSERTION N/A 05/22/2019   Procedure: TANDEM RING INSERTION;  Surgeon: Gery Pray, MD;  Location: [facility];  Service: Urology;  Laterality: N/A;   TANDEM RING INSERTION N/A 05/29/2019   Procedure: TANDEM RING INSERTION;  Surgeon: Gery Pray, MD;  Location: [facility];  Service: Urology;  Laterality: N/A;   TANDEM RING INSERTION N/A 06/05/2019   Procedure: TANDEM RING INSERTION;  Surgeon: Gery Pray, MD;  Location: [facility];  Service: Urology;  Laterality: N/A;    Patient Active Problem List   Diagnosis Date Noted   Dyspareunia in female 10/23/2021   Type 2 diabetes mellitus with hyperglycemia, without long-term current use of insulin ([place]) 07/03/2021   Hyperlipidemia associated with type 2 diabetes mellitus ([place]) 07/03/2021   Type 2 diabetes mellitus without complication, without long-term current use of insulin ([place]) 11/15/2020   Incontinence of urine in female 09/09/2020   Onychomycosis of toenail 07/31/2020   OSA (obstructive sleep apnea) 07/31/2020   Prediabetes 05/27/2020   Sinus congestion 04/16/2020   Port-A-Cath in place 01/22/2020   Other hyperlipidemia 01/10/2020   Vitamin D deficiency 01/10/2020   Insulin resistance 01/10/2020   Class 3 severe obesity with serious comorbidity and body mass index (BMI) of 40.0 to 44.9 in adult ([place]) 01/10/2020   Obesity, Class II, BMI 35-39.9 09/07/2019   Dehydration 05/30/2019   Peripheral neuropathy due to chemotherapy ([place]) 04/25/2019   Nausea without vomiting 04/18/2019   Diarrhea 04/04/2019   CKD (chronic kidney disease), symptom management only, stage 3 (moderate) ([place]) 03/30/2019   Generalized anxiety disorder 03/30/2019   Hypertension associated with type 2 diabetes mellitus ([place])    Anxiety 03/23/2019   Squamous cell carcinoma of cervix ([place]) 03/23/2019   Deficiency anemia 03/22/2019   REFERRING DIAG:  C53.9 (ICD-10-CM) - Malignant neoplasm of cervix, unspecified site ([place])  N94.10 (ICD-10-CM) - Dyspareunia in female      THERAPY DIAG:  Muscle weakness (generalized)   Pelvic pain   Stress incontinence of urine   Aftercare following surgery for neoplasm   Rationale for Evaluation and Treatment Rehabilitation   ONSET DATE: 07/07/2021   SUBJECTIVE:                                                                                                                                                                                            SUBJECTIVE STATEMENT: I am using the handle  and it is helping with the dilator. The moisture in the vaginal canal is better.    PAIN:  Are you having pain? Yes NPRS scale: 8/10 Pain location: Vaginal   Pain type: aching, burning, sharp, and tight Pain description: intermittent    Aggravating factors: penile penetration; using the larger dilator, vaginal exam Relieving factors: no vaginal penetration   PRECAUTIONS: Other: cancer   WEIGHT BEARING RESTRICTIONS No   FALLS:  Has patient fallen in last 6 months? No   LIVING ENVIRONMENT: Lives with: lives with their partner   OCCUPATION: part time   PLOF: Independent   PATIENT GOALS reduce pain with vaginal penetration   PERTINENT HISTORY:  Cervical cancer with radiation; diabetes, c-section     BOWEL MOVEMENT Pain with bowel movement: No   URINATION Pain with urination: No Fully empty bladder: Yes:   Stream:  average Urgency: Yes: some watches what she drinks when she goes for a car ride Frequency: can wait 3 hours Leakage: Coughing and Sneezing Pads: Yes: when in a car for a distance   INTERCOURSE Pain with intercourse: During Penetration, After Intercourse, and after gets a soreness Ability to have vaginal penetration:  Yes: not always comfortable to have the penis deeper Climax: not since her cancer Marinoff Scale: 2/3   PREGNANCY Vaginal deliveries 3 Tearing No C-section deliveries 1 Currently pregnant No       OBJECTIVE:    DIAGNOSTIC FINDINGS:  none   PATIENT SURVEYS:  PFIQ-7 62; UIQ-7 38, POPIQ-7 24   COGNITION:            Overall cognitive status: Within functional limits for tasks assessed                          SENSATION:            Light touch: Appears intact            Proprioception: Appears intact   MUSCLE LENGTH: Hamstrings: Right decreased  deg; Left decreased deg Thomas test: Right positive deg; Left positive deg                  POSTURE: rounded shoulders, forward head, decreased lumbar lordosis, and increased  abdominal size               PELVIC ALIGNMENT: correct alignment   LUMBARAROM/PROM   A/PROM A/PROM  eval 11/27/2021  Flexion full full  Extension Decreased by 50% Decreased by 25%  Right lateral flexion Decreased by 25% full  Left lateral flexion Decreased by 25% full  Right rotation Decreased by 25% full  Left rotation Decreased by 25% full   (Blank rows = not tested)   LOWER EXTREMITY ROM:   Active ROM Right eval Left eval  Hip extension 0 0   (Blank rows = not tested)   LOWER EXTREMITY MMT:   MMT Right eval Left eval  Hip extension 3+/5 3+/5  Hip abduction 3+/5 4/5     PALPATION:   General  tenderness suprapubically, bilaterally hip adductor by the groin,                  External Perineal Exam along the perineal body, ishiocavernosis, bulbocavernosus                             Internal Pelvic Floor along the introitus, levator ani, obturator internist, side of the bladder and urethra, and at  the vaginal vault. Therapist touched the vaginal vault with her index finger that Is 4 inches long   Patient confirms identification and approves PT to assess internal pelvic floor and treatment Yes   PELVIC MMT:   MMT eval 10/21/2021 11/20/2021  Vaginal 2/5 but not hug of therapist finger 3/5 with weak lift and hug of therapist finger 3/5  with circular weak hug  (Blank rows = not tested)         TONE: increased   PROLAPSE: None noted   TODAY'S TREATMENT  11/27/2021 Manual: Soft tissue mobilization:manual work to bilateral hip adductors.  Internal pelvic floor techniques:No emotional/communication barriers or cognitive limitation. Patient is motivated to learn. Patient understands and agrees with treatment goals and plan. PT explains patient will be examined in standing, sitting, and lying down to see how their muscles and joints work. When they are ready, they will be asked to remove their underwear so PT can examine their perineum. The patient is also given the option  of providing their own chaperone as one is not provided in our facility. The patient also has the right and is explained the right to defer or refuse any part of the evaluation or treatment including the internal exam. With the patient's consent, PT will use one gloved finger to gently assess the muscles of the pelvic floor, seeing how well it contracts and relaxes and if there is muscle symmetry. After, the patient will get dressed and PT and patient will discuss exam findings and plan of care. PT and patient discuss plan of care, schedule, attendance policy and HEP activities.  Going through the vagina working on the introitus, around the bladder along the anterior wall of the vaginal canal, working around the cervix to lengthen the vaginal canal, working along the levator ani and obturator internist  Self-care: Discussed with patient on using the bigger dilator because she is ready. Discussed with her on the use of the handle to work with the dilator and it is easier for her.     11/20/2021 Manual: Internal pelvic floor techniques:No emotional/communication barriers or cognitive limitation. Patient is motivated to learn. Patient understands and agrees with treatment goals and plan. PT explains patient will be examined in standing, sitting, and lying down to see how their muscles and joints work. When they are ready, they will be asked to remove their underwear so PT can examine their perineum. The patient is also given the option of providing their own chaperone as one is not provided in our facility. The patient also has the right and is explained the right to defer or refuse any part of the evaluation or treatment including the internal exam. With the patient's consent, PT will use one gloved finger to gently assess the muscles of the pelvic floor, seeing how well it contracts and relaxes and if there is muscle symmetry. After, the patient will get dressed and PT and patient will discuss exam findings and  plan of care. PT and patient discuss plan of care, schedule, attendance policy and HEP activities.  Going through the vagina working on the introitus, around the bladder along the anterior wall of the vaginal canal, working around the cervix to lengthen the vaginal canal, working along the levator ani and obturator internist     11/06/2021 Manual: Internal pelvic floor techniques:No emotional/communication barriers or cognitive limitation. Patient is motivated to learn. Patient understands and agrees with treatment goals and plan. PT explains patient will be examined in standing, sitting, and lying  down to see how their muscles and joints work. When they are ready, they will be asked to remove their underwear so PT can examine their perineum. The patient is also given the option of providing their own chaperone as one is not provided in our facility. The patient also has the right and is explained the right to defer or refuse any part of the evaluation or treatment including the internal exam. With the patient's consent, PT will use one gloved finger to gently assess the muscles of the pelvic floor, seeing how well it contracts and relaxes and if there is muscle symmetry. After, the patient will get dressed and PT and patient will discuss exam findings and plan of care. PT and patient discuss plan of care, schedule, attendance policy and HEP activities.  Going through the vagina working on the urogenital diaphragm then working on the [employer] Worked on the anterior wall of the vaginal canal and along the cervix to work on the mobility of the tissue and lengthening the vaginal canal in supine and right sidely Neuromuscular re-education: Pelvic floor contraction training:tactile cues with therapist finger in the vaginal canal working on quick contractions and engagement of the lower abdomen Self-care: Educated patient on why she is not able to get the dilator all the way into the vaginal canal. The length  of the vaginal canal is shorter due to the radiation and changes to the tissues. She is hitting the top of the vault and feeling the achiness.       PATIENT EDUCATION: 10/21/2021 Education details: Access Code: [email or web address] Person educated: Patient Education method: Explanation, Demonstration, Tactile cues, Verbal cues, and Handouts Education comprehension: verbalized understanding, returned demonstration, verbal cues required, tactile cues required, and needs further education     HOME EXERCISE PROGRAM: 10/21/2021  Access Code: [email or web address], talked about the ohnut and handle for the vaginal dilator URL: [email or web address] Date: 10/21/2021 Prepared by: Earlie Counts   Program Notes use the estrace cream  into the vaginal canal 2 times per week. place it in the canal with the applicator then use your finger to rub it in and the extra rub into the vulvar are. Use the good clean love daily on the vulva area.    Exercises - Hooklying Hamstring Stretch with Strap  - 1 x daily - 7 x weekly - 1 sets - 2 reps - 30 sec hold - Supine ITB Stretch with Strap  - 1 x daily - 7 x weekly - 1 sets - 2 reps - 30 sec hold - Supine Piriformis Stretch Pulling Heel to Hip  - 1 x daily - 7 x weekly - 1 sets - 2 reps - 30 sec hold - Prone Press Up  - 1 x daily - 7 x weekly - 1 sets - 10 reps - Prone Hip Extension with Pillow Under Abdomen  - 1 x daily - 3 x weekly - 2 sets - 10 reps ASSESSMENT:   CLINICAL IMPRESSION: Patient is a 46 y.o. female who was seen today for physical therapy  treatment for Dyspareunia.  Patient is able to feel the urinary leakage .  She only leaks with coughing and sneezing. Patient has increased in lumbar ROM. Patient is able to urinate every 3 hours. Patient has difficulty with quick contraction of the pelvic floor and using the lower abdominals.  Therapist was able to place her index finger into the vaginal canal 6 inches again today. The anterior vaginal canal above the  bladder is not  as elastic .  The furthest it has been. . Patient will benefit from skilled therapy to reduce pelvic pain, improve tissue extensibility for the radiation and reduce urinary leakage.      OBJECTIVE IMPAIRMENTS decreased activity tolerance, decreased coordination, decreased endurance, decreased strength, increased fascial restrictions, increased muscle spasms, impaired flexibility, and pain.    ACTIVITY LIMITATIONS bending, standing, continence, toileting, and vaginal exam   PARTICIPATION LIMITATIONS: interpersonal relationship, shopping, and community activity   PERSONAL FACTORS Cervical cancer with radiation; diabetes, c-section are also affecting patient's functional outcome.    REHAB POTENTIAL: Good   CLINICAL DECISION MAKING: Evolving/moderate complexity   EVALUATION COMPLEXITY: Moderate     GOALS: Goals reviewed with patient? Yes   SHORT TERM GOALS: Target date: 11/04/2021   Patient understand how to use moisturizers to the vaginal canal to improve the quality of the tissue.  Baseline:not educated yet Goal status: met 10/14/2021  2.  Patient educated on Ohnut to use with her dilator to make it easier to insert and relax with it in the canal.  Baseline: Not educated yet Goal status: Met 11/27/2021   3.  Instructed on how to use the handle for the dilator to place it in the vaginal canal.  Baseline: not educated yet Goal status: Met 11/27/2021   LONG TERM GOALS: Target date: 12/30/2021    Patient is independent with her advanced HEP for pelvic floor stretches, hip strength and pelvic floor coordination.  Baseline: Not educated yet Goal status: INITIAL   2.  Patient able to have vaginal penetration with pain level </= 2-3/10 due to improved mobility of the tissue and accommodations using a device or different positions.  Baseline: pain level 8/10 Goal status: INITIAL   3.  Patient able to use the largest dilator with pain </= 1-2/10 to continue to remodel the  fibrotic tissue of the vaginal wall.  Baseline: pain level 8/10 and on the third dilator Goal status: INITIAL   4.  Urinary leakage decreased >/= 70% due to the ability to fully relax and fully contract the pelvic floor and strength >/= 3/5 Baseline: urinary leakage with coughing and sneezing and strength is 3/5 without hug of therapist finger.  Goal status: INITIAL     PLAN: PT FREQUENCY: 1x/week   PT DURATION: 12 weeks   PLANNED INTERVENTIONS: Therapeutic exercises, Therapeutic activity, Neuromuscular re-education, Patient/Family education, Self Care, Joint mobilization, Dry Needling, Electrical stimulation, Spinal mobilization, Taping, Biofeedback, and Manual therapy   PLAN FOR NEXT SESSION: manual work on the anterior wall, work on pelvic floor contraction; put in medicaid, manual work to the thighs, internal work on the right side, hip stretches in standing  Earlie Counts, PT 11/27/21 12:59 PM

## 2021-12-02 ENCOUNTER — Encounter: Payer: Medicaid Other | Attending: Gynecologic Oncology | Admitting: Physical Therapy

## 2021-12-02 ENCOUNTER — Encounter: Payer: Self-pay | Admitting: Physical Therapy

## 2021-12-02 DIAGNOSIS — M6281 Muscle weakness (generalized): Secondary | ICD-10-CM | POA: Diagnosis not present

## 2021-12-02 DIAGNOSIS — Z483 Aftercare following surgery for neoplasm: Secondary | ICD-10-CM | POA: Insufficient documentation

## 2021-12-02 DIAGNOSIS — N393 Stress incontinence (female) (male): Secondary | ICD-10-CM | POA: Diagnosis present

## 2021-12-02 DIAGNOSIS — R102 Pelvic and perineal pain: Secondary | ICD-10-CM | POA: Insufficient documentation

## 2021-12-02 NOTE — Therapy (Signed)
OUTPATIENT PHYSICAL THERAPY TREATMENT NOTE   Patient Name: Tamara Osborne MRN: [id number] DOB:September 23, 1975, 45 y.o., female Today's Date: 12/02/2021  PCP: Kerin Perna, NP REFERRING PROVIDER: [name], MD  END OF SESSION:   PT End of Session - 12/02/21 0947     Visit Number 8    Date for PT Re-Evaluation 12/30/21    Authorization Type [employer]    Authorization Time Period 10/07/2021-12/05/2021    Authorization - Visit Number 7    Authorization - Number of Visits 7    PT Start Time 0930    PT Stop Time 1030    PT Time Calculation (min) 60 min    Activity Tolerance Patient tolerated treatment well    Behavior During Therapy [facility] for tasks assessed/performed             Past Medical History:  Diagnosis Date   Anemia    Anxiety    Bartholin cyst    Cervical cancer ([place])    Chronic kidney disease    Constipation    Depression    Deviated septum    Diabetes mellitus without complication ([place])    Difficult intravenous access    used port for 05-09-2019 surgery   Fibroids    History of blood transfusion    2 units given 04-26-2019, has had total of 8 units since jan 2021   History of blood transfusion 1 unit given 05-30-19   iv fluids also given   History of kidney problems    History of radiation therapy 03/23/2019-05/04/2019   Cervical external beam   Dr Gery Pray   History of radiation therapy 05/09/2019-06/05/2019   vaginal brachytherapy   Dr Gery Pray   History of recent blood transfusion 05/16/2019   2 units given  per dr Pollyann Savoy at cancer center   Hypertension    Neuropathy    both thumbs   Obesity    Palpitations    PONV (postoperative nausea and vomiting)    Prediabetes    Preeclampsia 2006   Sleep apnea    no cpap used insurance would not cover, osa severe per pt   SOB (shortness of breath)    Swallowing difficulty    Past Surgical History:  Procedure Laterality Date   CESAREAN SECTION WITH BILATERAL TUBAL LIGATION  11/10/2004        DILATION AND CURETTAGE OF UTERUS  1997   IR IMAGING GUIDED PORT INSERTION  04/04/2019   IR REMOVAL TUN ACCESS W/ PORT W/O FL MOD SED  10/27/2021   OPERATIVE ULTRASOUND N/A 05/09/2019   Procedure: OPERATIVE ULTRASOUND;  Surgeon: Gery Pray, MD;  Location: [place];  Service: Urology;  Laterality: N/A;   OPERATIVE ULTRASOUND N/A 05/15/2019   Procedure: OPERATIVE ULTRASOUND;  Surgeon: Gery Pray, MD;  Location[facility];  Service: Urology;  Laterality: N/A;   OPERATIVE ULTRASOUND N/A 05/22/2019   Procedure: OPERATIVE ULTRASOUND;  Surgeon: Gery Pray, MD;  Location: [facility];  Service: Urology;  Laterality: N/A;   OPERATIVE ULTRASOUND N/A 05/29/2019   Procedure: OPERATIVE ULTRASOUND;  Surgeon: Gery Pray, MD;  Location: [facility];  Service: Urology;  Laterality: N/A;   OPERATIVE ULTRASOUND N/A 06/05/2019   Procedure: OPERATIVE ULTRASOUND;  Surgeon: Gery Pray, MD;  Location: [facility];  Service: Urology;  Laterality: N/A;   TANDEM RING INSERTION N/A 05/09/2019   Procedure: TANDEM RING INSERTION;  Surgeon: Gery Pray, MD;  Location: [facility];  Service: Urology;  Laterality: N/A;   TANDEM RING INSERTION N/A 05/15/2019   Procedure: TANDEM RING INSERTION;  Surgeon: Gery Pray, MD;  Location: [facility];  Service: Urology;  Laterality: N/A;   TANDEM RING INSERTION N/A 05/22/2019   Procedure: TANDEM RING INSERTION;  Surgeon: Gery Pray, MD;  Location: [facility];  Service: Urology;  Laterality: N/A;   TANDEM RING INSERTION N/A 05/29/2019   Procedure: TANDEM RING INSERTION;  Surgeon: Gery Pray, MD;  Location: [facility];  Service: Urology;  Laterality: N/A;   TANDEM RING INSERTION N/A 06/05/2019   Procedure: TANDEM RING INSERTION;  Surgeon: Gery Pray, MD;  Location: [facility];  Service: Urology;  Laterality: N/A;    Patient Active Problem List   Diagnosis Date Noted   Dyspareunia in female 10/23/2021   Type 2 diabetes mellitus with hyperglycemia, without long-term current use of insulin ([place]) 07/03/2021   Hyperlipidemia associated with type 2 diabetes mellitus ([place]) 07/03/2021   Type 2 diabetes mellitus without complication, without long-term current use of insulin ([place]) 11/15/2020   Incontinence of urine in female 09/09/2020   Onychomycosis of toenail 07/31/2020   OSA (obstructive sleep apnea) 07/31/2020   Prediabetes 05/27/2020   Sinus congestion 04/16/2020   Port-A-Cath in place 01/22/2020   Other hyperlipidemia 01/10/2020   Vitamin D deficiency 01/10/2020   Insulin resistance 01/10/2020   Class 3 severe obesity with serious comorbidity and body mass index (BMI) of 40.0 to 44.9 in adult ([place]) 01/10/2020   Obesity, Class II, BMI 35-39.9 09/07/2019   Dehydration 05/30/2019   Peripheral neuropathy due to chemotherapy ([place]) 04/25/2019   Nausea without vomiting 04/18/2019   Diarrhea 04/04/2019   CKD (chronic kidney disease), symptom management only, stage 3 (moderate) ([place]) 03/30/2019   Generalized anxiety disorder 03/30/2019   Hypertension associated with type 2 diabetes mellitus ([place])    Anxiety 03/23/2019   Squamous cell carcinoma of cervix ([place]) 03/23/2019   Deficiency anemia 03/22/2019   REFERRING DIAG:  C53.9 (ICD-10-CM) - Malignant neoplasm of cervix, unspecified site ([place])  N94.10 (ICD-10-CM) - Dyspareunia in female      THERAPY DIAG:  Muscle weakness (generalized)   Pelvic pain   Stress incontinence of urine   Aftercare following surgery for neoplasm   Rationale for Evaluation and Treatment Rehabilitation   ONSET DATE: 07/07/2021   SUBJECTIVE:                                                                                                                                                                                            SUBJECTIVE STATEMENT: I tried the bigger  dilator and not issue with going into the vaginal canal. I feel the achiness when it is inserted deeper.     PAIN:  Are you having pain? Yes NPRS scale: 8/10 Pain location: Vaginal   Pain type: aching, burning, sharp, and tight Pain description: intermittent    Aggravating factors: penile penetration; using the larger dilator, vaginal exam Relieving factors: no vaginal penetration   PRECAUTIONS: Other: cancer   WEIGHT BEARING RESTRICTIONS No   FALLS:  Has patient fallen in last 6 months? No   LIVING ENVIRONMENT: Lives with: lives with their partner   OCCUPATION: part time   PLOF: Independent   PATIENT GOALS reduce pain with vaginal penetration   PERTINENT HISTORY:  Cervical cancer with radiation; diabetes, c-section     BOWEL MOVEMENT Pain with bowel movement: No   URINATION Pain with urination: No Fully empty bladder: Yes:   Stream:  average Urgency: Yes: some watches what she drinks when she goes for a car ride Frequency: can wait 3 hours Leakage: Coughing and Sneezing Pads: Yes: when in a car for a distance   INTERCOURSE Pain with intercourse: During Penetration, After Intercourse, and after gets a soreness Ability to have vaginal penetration:  Yes: not always comfortable to have the penis deeper Climax: not since her cancer Marinoff Scale: 2/3   PREGNANCY Vaginal deliveries 3 Tearing No C-section deliveries 1 Currently pregnant No       OBJECTIVE:    DIAGNOSTIC FINDINGS:  none   PATIENT SURVEYS:  12/02/2021 PFIQ-7 48; UIQ-7 38, POPIQ-7 10   COGNITION:            Overall cognitive status: Within functional limits for tasks assessed                          SENSATION:            Light touch: Appears intact            Proprioception: Appears intact   MUSCLE LENGTH: Hamstrings: Right decreased  deg; Left decreased deg Thomas test: Right positive deg; Left positive deg                  POSTURE: rounded shoulders, forward head, decreased  lumbar lordosis, and increased abdominal size               PELVIC ALIGNMENT: correct alignment   LUMBARAROM/PROM   A/PROM A/PROM  eval 11/27/2021 12/02/2021  Flexion full full full  Extension Decreased by 50% Decreased by 25% Decreased by 25%  Right lateral flexion Decreased by 25% full full  Left lateral flexion Decreased by 25% full full  Right rotation Decreased by 25% full full  Left rotation Decreased by 25% full full   (Blank rows = not tested)   LOWER EXTREMITY ROM:   Active ROM Right eval Left eval Right 12/02/2021 Left 12/02/2021  Hip extension 0 0 5 5   (Blank rows = not tested)   LOWER EXTREMITY MMT:   MMT Right eval Left eval Right 12/02/2021 Left 12/02/2021  Hip extension 3+/5 3+/5 4/5 4/5  Hip abduction 3+/5 4/5 4+/5 5/5     PALPATION:   General  tenderness suprapubically, bilaterally hip adductor by the groin,                  External Perineal Exam along the perineal body, ishiocavernosis, bulbocavernosus  Internal Pelvic Floor along the introitus, levator ani, obturator internist, side of the bladder and urethra, and at the vaginal vault. Therapist touched the vaginal vault with her index finger that Is 4 inches long   Patient confirms identification and approves PT to assess internal pelvic floor and treatment Yes   PELVIC MMT:   MMT eval 10/21/2021 11/20/2021  Vaginal 2/5 but not hug of therapist finger 3/5 with weak lift and hug of therapist finger 3/5  with circular weak hug  (Blank rows = not tested)         TONE: increased   PROLAPSE: None noted   TODAY'S TREATMENT  12/02/2021 Manual: Internal pelvic floor techniques:No emotional/communication barriers or cognitive limitation. Patient is motivated to learn. Patient understands and agrees with treatment goals and plan. PT explains patient will be examined in standing, sitting, and lying down to see how their muscles and joints work. When they are ready, they will be  asked to remove their underwear so PT can examine their perineum. The patient is also given the option of providing their own chaperone as one is not provided in our facility. The patient also has the right and is explained the right to defer or refuse any part of the evaluation or treatment including the internal exam. With the patient's consent, PT will use one gloved finger to gently assess the muscles of the pelvic floor, seeing how well it contracts and relaxes and if there is muscle symmetry. After, the patient will get dressed and PT and patient will discuss exam findings and plan of care. PT and patient discuss plan of care, schedule, attendance policy and HEP activities.  Going through the vaginal working on the top of the bladder, along the sides of the bladder, top of the vaginal canal, along the levator ani with hip movements to assist in the stretch of the tissue Exercises: Stretches/mobility:hamstring stretch holding 30 sec bil.  ITB stretch with strap holding 30 sec bil.  Buttery fly stretch with strap holding 30 sec each side    11/27/2021 Manual: Soft tissue mobilization:manual work to bilateral hip adductors.  Internal pelvic floor techniques:No emotional/communication barriers or cognitive limitation. Patient is motivated to learn. Patient understands and agrees with treatment goals and plan. PT explains patient will be examined in standing, sitting, and lying down to see how their muscles and joints work. When they are ready, they will be asked to remove their underwear so PT can examine their perineum. The patient is also given the option of providing their own chaperone as one is not provided in our facility. The patient also has the right and is explained the right to defer or refuse any part of the evaluation or treatment including the internal exam. With the patient's consent, PT will use one gloved finger to gently assess the muscles of the pelvic floor, seeing how well it  contracts and relaxes and if there is muscle symmetry. After, the patient will get dressed and PT and patient will discuss exam findings and plan of care. PT and patient discuss plan of care, schedule, attendance policy and HEP activities.  Going through the vagina working on the introitus, around the bladder along the anterior wall of the vaginal canal, working around the cervix to lengthen the vaginal canal, working along the levator ani and obturator internist  Self-care: Discussed with patient on using the bigger dilator because she is ready. Discussed with her on the use of the handle to work with the dilator and  it is easier for her.     11/20/2021 Manual: Internal pelvic floor techniques:No emotional/communication barriers or cognitive limitation. Patient is motivated to learn. Patient understands and agrees with treatment goals and plan. PT explains patient will be examined in standing, sitting, and lying down to see how their muscles and joints work. When they are ready, they will be asked to remove their underwear so PT can examine their perineum. The patient is also given the option of providing their own chaperone as one is not provided in our facility. The patient also has the right and is explained the right to defer or refuse any part of the evaluation or treatment including the internal exam. With the patient's consent, PT will use one gloved finger to gently assess the muscles of the pelvic floor, seeing how well it contracts and relaxes and if there is muscle symmetry. After, the patient will get dressed and PT and patient will discuss exam findings and plan of care. PT and patient discuss plan of care, schedule, attendance policy and HEP activities.  Going through the vagina working on the introitus, around the bladder along the anterior wall of the vaginal canal, working around the cervix to lengthen the vaginal canal, working along the [employer] and obturator internist     PATIENT  EDUCATION: 10/21/2021 Education details: Access Code: [email or web address] Person educated: Patient Education method: Explanation, Demonstration, Tactile cues, Verbal cues, and Handouts Education comprehension: verbalized understanding, returned demonstration, verbal cues required, tactile cues required, and needs further education     HOME EXERCISE PROGRAM: 10/21/2021  Access Code: [email or web address], talked about the ohnut and handle for the vaginal dilator URL: [email or web address] Date: 10/21/2021 Prepared by: Earlie Counts   Program Notes use the estrace cream  into the vaginal canal 2 times per week. place it in the canal with the applicator then use your finger to rub it in and the extra rub into the vulvar are. Use the good clean love daily on the vulva area.    Exercises - Hooklying Hamstring Stretch with Strap  - 1 x daily - 7 x weekly - 1 sets - 2 reps - 30 sec hold - Supine ITB Stretch with Strap  - 1 x daily - 7 x weekly - 1 sets - 2 reps - 30 sec hold - Supine Piriformis Stretch Pulling Heel to Hip  - 1 x daily - 7 x weekly - 1 sets - 2 reps - 30 sec hold - Prone Press Up  - 1 x daily - 7 x weekly - 1 sets - 10 reps - Prone Hip Extension with Pillow Under Abdomen  - 1 x daily - 3 x weekly - 2 sets - 10 reps ASSESSMENT:   CLINICAL IMPRESSION: Patient is a 46 y.o. female who was seen today for physical therapy  treatment for Dyspareunia.  Patient is able to feel the urinary leakage .  She only leaks with coughing and sneezing. Patient has increased in lumbar ROM. Patient is able to urinate every 3 hours. Patient has difficulty with quick contraction of the pelvic floor and using the lower abdominals.  Therapist was able to place her index finger into the vaginal canal 6 inches again today. The anterior vaginal canal above the bladder is not as elastic making it difficult for the vaginal canal to elongate. Patient is able to use the largest dilator abut will have the lower abdominal  cramping at 6/10 when the dilator hits the end of the vaginal canal. Patient is  looking into the Ohnut to reduce the length of the penis for vaginal penetration. Patient will benefit from skilled therapy to reduce pelvic pain, improve tissue extensibility for the radiation and reduce urinary leakage.      OBJECTIVE IMPAIRMENTS decreased activity tolerance, decreased coordination, decreased endurance, decreased strength, increased fascial restrictions, increased muscle spasms, impaired flexibility, and pain.    ACTIVITY LIMITATIONS bending, standing, continence, toileting, and vaginal exam   PARTICIPATION LIMITATIONS: interpersonal relationship, shopping, and community activity   PERSONAL FACTORS Cervical cancer with radiation; diabetes, c-section are also affecting patient's functional outcome.    REHAB POTENTIAL: Good   CLINICAL DECISION MAKING: Evolving/moderate complexity   EVALUATION COMPLEXITY: Moderate     GOALS: Goals reviewed with patient? Yes   SHORT TERM GOALS: Target date: 11/04/2021   Patient understand how to use moisturizers to the vaginal canal to improve the quality of the tissue.  Baseline:not educated yet Goal status: met 10/14/2021   2.  Patient educated on Ohnut to use with her dilator to make it easier to insert and relax with it in the canal.  Baseline: Not educated yet Goal status: Met 11/27/2021   3.  Instructed on how to use the handle for the dilator to place it in the vaginal canal.  Baseline: not educated yet Goal status: Met 11/27/2021   LONG TERM GOALS: Target date: 12/30/2021    Patient is independent with her advanced HEP for pelvic floor stretches, hip strength and pelvic floor coordination.  Baseline: Not educated yet Goal status: Ongoing 12/02/2021   2.  Patient able to have vaginal penetration with pain level </= 2-3/10 due to improved mobility of the tissue and accommodations using a device or different positions.  Baseline: pain level  8/10 Goal status: ongoing   3.  Patient able to use the largest dilator with pain </= 1-2/10 to continue to remodel the fibrotic tissue of the vaginal wall.  Baseline: pain level 6/10 when using the last 2 dilators at the end of the vaginal canal.  Goal status: Ongoing 12/02/2021   4.  Urinary leakage decreased >/= 70% due to the ability to fully relax and fully contract the pelvic floor and strength >/= 3/5 Baseline: urinary leakage with coughing and sneezing and strength is 3/5 without hug of therapist finger.  Goal status: Ongoing 12/02/2021     PLAN: PT FREQUENCY: 1x/week   PT DURATION: 4 weeks   PLANNED INTERVENTIONS: Therapeutic exercises, Therapeutic activity, Neuromuscular re-education, Patient/Family education, Self Care, Joint mobilization, Dry Needling, Electrical stimulation, Spinal mobilization, Taping, Biofeedback, and Manual therapy   PLAN FOR NEXT SESSION: manual work on the anterior wall, work on pelvic floor contraction; put in medicaid, manual work to the thighs, internal work on the right side, hip stretches in standing    Earlie Counts, PT 12/02/21 10:50 AM

## 2021-12-04 ENCOUNTER — Encounter: Payer: Self-pay | Admitting: Podiatry

## 2021-12-04 ENCOUNTER — Encounter: Payer: Self-pay | Admitting: Physical Therapy

## 2021-12-04 ENCOUNTER — Ambulatory Visit: Payer: Medicaid Other | Admitting: Podiatry

## 2021-12-04 DIAGNOSIS — L600 Ingrowing nail: Secondary | ICD-10-CM

## 2021-12-04 DIAGNOSIS — M7751 Other enthesopathy of right foot: Secondary | ICD-10-CM | POA: Diagnosis not present

## 2021-12-09 ENCOUNTER — Encounter: Payer: Medicaid Other | Admitting: Physical Therapy

## 2021-12-09 ENCOUNTER — Encounter: Payer: Self-pay | Admitting: Physical Therapy

## 2021-12-09 ENCOUNTER — Ambulatory Visit (INDEPENDENT_AMBULATORY_CARE_PROVIDER_SITE_OTHER): Payer: Medicaid Other | Admitting: Family Medicine

## 2021-12-09 DIAGNOSIS — M6281 Muscle weakness (generalized): Secondary | ICD-10-CM

## 2021-12-09 DIAGNOSIS — R102 Pelvic and perineal pain: Secondary | ICD-10-CM

## 2021-12-09 DIAGNOSIS — Z483 Aftercare following surgery for neoplasm: Secondary | ICD-10-CM

## 2021-12-09 DIAGNOSIS — N393 Stress incontinence (female) (male): Secondary | ICD-10-CM

## 2021-12-09 NOTE — Progress Notes (Signed)
Subjective:   Patient ID: Tamara Osborne, female   DOB: 46 y.o.   MRN: [id number]   HPI Patient presents stating she has a painful ingrown toenail of her left big toe and she has inflammation of her joint right that she is concerned about and has questions concerning   ROS      Objective:  Physical Exam  Neurovascular status intact diabetes under good control with incurvated left hallux lateral border with good digital perfusion noted and warm toes.  The right second MPJ is sore also with inflammation     Assessment:  In the prone toenail deformity left hallux border with inflammation right foot consistent with inflammatory capsulitis     Plan:  H&P reviewed both conditions recommended correction of the ingrown toenail left and patient wants this done and I explained procedure risk and patient wants surgery.  At this point I went ahead and explained what would be done in reviewed with her risk associated with this and she is willing to have done signed consent form and today I infiltrated 60 mg like Marcaine mixture sterile prep done using sterile instrumentation remove the border exposed matrix applied phenol 3 applications 30 seconds followed by alcohol lavage sterile dressing gave instructions on soaks and reappoint to recheck

## 2021-12-09 NOTE — Therapy (Signed)
OUTPATIENT PHYSICAL THERAPY TREATMENT NOTE   Patient Name: Tamara Osborne MRN: [id number] DOB:1975/03/04, 46 y.o., female Today's Date: 12/09/2021  PCP: Kerin Perna, NP REFERRING PROVIDER: [name], MD  END OF SESSION:   PT End of Session - 12/09/21 1305     Visit Number 9    Date for PT Re-Evaluation 12/30/21    Authorization Type [employer]    Authorization Time Period 12/05/2021-02/02/2022    Authorization - Visit Number 1    Authorization - Number of Visits 5    PT Start Time 1300    PT Stop Time 7026    PT Time Calculation (min) 45 min    Activity Tolerance Patient tolerated treatment well    Behavior During Therapy WFL for tasks assessed/performed             Past Medical History:  Diagnosis Date   Anemia    Anxiety    Bartholin cyst    Cervical cancer ([place])    Chronic kidney disease    Constipation    Depression    Deviated septum    Diabetes mellitus without complication ([place])    Difficult intravenous access    used port for 05-09-2019 surgery   Fibroids    History of blood transfusion    2 units given 04-26-2019, has had total of 8 units since jan 2021   History of blood transfusion 1 unit given 05-30-19   iv fluids also given   History of kidney problems    History of radiation therapy 03/23/2019-05/04/2019   Cervical external beam   Dr Gery Pray   History of radiation therapy 05/09/2019-06/05/2019   vaginal brachytherapy   Dr Gery Pray   History of recent blood transfusion 05/16/2019   2 units given  per dr Pollyann Savoy at cancer center   Hypertension    Neuropathy    both thumbs   Obesity    Palpitations    PONV (postoperative nausea and vomiting)    Prediabetes    Preeclampsia 2006   Sleep apnea    no cpap used insurance would not cover, osa severe per pt   SOB (shortness of breath)    Swallowing difficulty    Past Surgical History:  Procedure Laterality Date   CESAREAN SECTION WITH BILATERAL TUBAL LIGATION  11/10/2004        DILATION AND CURETTAGE OF UTERUS  1997   IR IMAGING GUIDED PORT INSERTION  04/04/2019   IR REMOVAL TUN ACCESS W/ PORT W/O FL MOD SED  10/27/2021   OPERATIVE ULTRASOUND N/A 05/09/2019   Procedure: OPERATIVE ULTRASOUND;  Surgeon: Gery Pray, MD;  Location: [facility];  Service: Urology;  Laterality: N/A;   OPERATIVE ULTRASOUND N/A 05/15/2019   Procedure: OPERATIVE ULTRASOUND;  Surgeon: Gery Pray, MD;  Location: [facility];  Service: Urology;  Laterality: N/A;   OPERATIVE ULTRASOUND N/A 05/22/2019   Procedure: OPERATIVE ULTRASOUND;  Surgeon: Gery Pray, MD;  Location: [facility];  Service: Urology;  Laterality: N/A;   OPERATIVE ULTRASOUND N/A 05/29/2019   Procedure: OPERATIVE ULTRASOUND;  Surgeon: Gery Pray, MD;  Location: [facility];  Service: Urology;  Laterality: N/A;   OPERATIVE ULTRASOUND N/A 06/05/2019   Procedure: OPERATIVE ULTRASOUND;  Surgeon: Gery Pray, MD;  [facility];  Service: Urology;  Laterality: N/A;   TANDEM RING INSERTION N/A 05/09/2019   Procedure: TANDEM RING INSERTION;  Surgeon: Gery Pray, MD;  Location: [facility];  Service: Urology;  Laterality: N/A;   TANDEM RING INSERTION N/A 05/15/2019   Procedure: TANDEM RING INSERTION;  Surgeon: Gery Pray, MD;  Location: [facility];  Service: Urology;  Laterality: N/A;   TANDEM RING INSERTION N/A 05/22/2019   Procedure: TANDEM RING INSERTION;  Surgeon: Gery Pray, MD;  Location: [facility];  Service: Urology;  Laterality: N/A;   TANDEM RING INSERTION N/A 05/29/2019   Procedure: TANDEM RING INSERTION;  Surgeon: Gery Pray, MD;  Location: [facility];  Service: Urology;  Laterality: N/A;   TANDEM RING INSERTION N/A 06/05/2019   Procedure: TANDEM RING INSERTION;  Surgeon: Gery Pray, MD;  Location: [facility];  Service: Urology;  Laterality: N/A;    Patient Active Problem List   Diagnosis Date Noted   Dyspareunia in female 10/23/2021   Type 2 diabetes mellitus with hyperglycemia, without long-term current use of insulin ([place]) 07/03/2021   Hyperlipidemia associated with type 2 diabetes mellitus ([place]) 07/03/2021   Type 2 diabetes mellitus without complication, without long-term current use of insulin ([place]) 11/15/2020   Incontinence of urine in female 09/09/2020   Onychomycosis of toenail 07/31/2020   OSA (obstructive sleep apnea) 07/31/2020   Prediabetes 05/27/2020   Sinus congestion 04/16/2020   Port-A-Cath in place 01/22/2020   Other hyperlipidemia 01/10/2020   Vitamin D deficiency 01/10/2020   Insulin resistance 01/10/2020   Class 3 severe obesity with serious comorbidity and body mass index (BMI) of 40.0 to 44.9 in adult ([place]) 01/10/2020   Obesity, Class II, BMI 35-39.9 09/07/2019   Dehydration 05/30/2019   Peripheral neuropathy due to chemotherapy ([place]) 04/25/2019   Nausea without vomiting 04/18/2019   Diarrhea 04/04/2019   CKD (chronic kidney disease), symptom management only, stage 3 (moderate) ([place]) 03/30/2019   Generalized anxiety disorder 03/30/2019   Hypertension associated with type 2 diabetes mellitus ([place])    Anxiety 03/23/2019   Squamous cell carcinoma of cervix ([place]) 03/23/2019   Deficiency anemia 03/22/2019   REFERRING DIAG:  C53.9 (ICD-10-CM) - Malignant neoplasm of cervix, unspecified site ([place])  N94.10 (ICD-10-CM) - Dyspareunia in female      THERAPY DIAG:  Muscle weakness (generalized)   Pelvic pain   Stress incontinence of urine   Aftercare following surgery for neoplasm   Rationale for Evaluation and Treatment Rehabilitation   ONSET DATE: 07/07/2021   SUBJECTIVE:                                                                                                                                                                                            SUBJECTIVE STATEMENT: I can use the bigger  dilator for the width but the length causes discomfort at the top.    PAIN:  Are you having pain? Yes NPRS scale: 8/10 Pain location: Vaginal   Pain type: aching, burning, sharp, and tight Pain description: intermittent    Aggravating factors: penile penetration; using the larger dilator, vaginal exam Relieving factors: no vaginal penetration   PRECAUTIONS: Other: cancer   WEIGHT BEARING RESTRICTIONS No   FALLS:  Has patient fallen in last 6 months? No   LIVING ENVIRONMENT: Lives with: lives with their partner   OCCUPATION: part time   PLOF: Independent   PATIENT GOALS reduce pain with vaginal penetration   PERTINENT HISTORY:  Cervical cancer with radiation; diabetes, c-section     BOWEL MOVEMENT Pain with bowel movement: No   URINATION Pain with urination: No Fully empty bladder: Yes:   Stream:  average Urgency: Yes: some watches what she drinks when she goes for a car ride Frequency: can wait 3 hours Leakage: Coughing and Sneezing Pads: Yes: when in a car for a distance   INTERCOURSE Pain with intercourse: During Penetration, After Intercourse, and after gets a soreness Ability to have vaginal penetration:  Yes: not always comfortable to have the penis deeper Climax: not since her cancer Marinoff Scale: 2/3   PREGNANCY Vaginal deliveries 3 Tearing No C-section deliveries 1 Currently pregnant No       OBJECTIVE:    DIAGNOSTIC FINDINGS:  none   PATIENT SURVEYS:  12/02/2021 PFIQ-7 48; UIQ-7 38, POPIQ-7 10   COGNITION:            Overall cognitive status: Within functional limits for tasks assessed                          SENSATION:            Light touch: Appears intact            Proprioception: Appears intact   MUSCLE LENGTH: Hamstrings: Right decreased  deg; Left decreased deg Thomas test: Right positive deg; Left positive deg                  POSTURE: rounded shoulders, forward head, decreased lumbar lordosis, and increased abdominal  size               PELVIC ALIGNMENT: correct alignment   LUMBARAROM/PROM   A/PROM A/PROM  eval 11/27/2021 12/02/2021  Flexion full full full  Extension Decreased by 50% Decreased by 25% Decreased by 25%  Right lateral flexion Decreased by 25% full full  Left lateral flexion Decreased by 25% full full  Right rotation Decreased by 25% full full  Left rotation Decreased by 25% full full   (Blank rows = not tested)   LOWER EXTREMITY ROM:   Active ROM Right eval Left eval Right 12/02/2021 Left 12/02/2021  Hip extension 0 0 5 5   (Blank rows = not tested)   LOWER EXTREMITY MMT:   MMT Right eval Left eval Right 12/02/2021 Left 12/02/2021  Hip extension 3+/5 3+/5 4/5 4/5  Hip abduction 3+/5 4/5 4+/5 5/5     PALPATION:   General  tenderness suprapubically, bilaterally hip adductor by the groin,                  External Perineal Exam along the perineal body, ishiocavernosis, bulbocavernosus  Internal Pelvic Floor along the introitus, levator ani, obturator internist, side of the bladder and urethra, and at the vaginal vault. Therapist touched the vaginal vault with her index finger that Is 4 inches long   Patient confirms identification and approves PT to assess internal pelvic floor and treatment Yes   PELVIC MMT:   MMT eval 10/21/2021 11/20/2021 12/09/2021  Vaginal 2/5 but not hug of therapist finger 3/5 with weak lift and hug of therapist finger 3/5  with circular weak hug 3/5 with weak hug  (Blank rows = not tested)         TONE: increased   PROLAPSE: None noted   TODAY'S TREATMENT  12/09/2021 Manual: Internal pelvic floor techniques:No emotional/communication barriers or cognitive limitation. Patient is motivated to learn. Patient understands and agrees with treatment goals and plan. PT explains patient will be examined in standing, sitting, and lying down to see how their muscles and joints work. When they are ready, they will be asked to  remove their underwear so PT can examine their perineum. The patient is also given the option of providing their own chaperone as one is not provided in our facility. The patient also has the right and is explained the right to defer or refuse any part of the evaluation or treatment including the internal exam. With the patient's consent, PT will use one gloved finger to gently assess the muscles of the pelvic floor, seeing how well it contracts and relaxes and if there is muscle symmetry. After, the patient will get dressed and PT and patient will discuss exam findings and plan of care. PT and patient discuss plan of care, schedule, attendance policy and HEP activities.  Therapist finger in the vaginal canal releasing the top of the bladder, along the cervix, along the anterior vaginal wall going slowly due to the fibrotic tissue Manual work along the levator ani, along the pubic rami, pubovaginalis tl elongate and facilitate a vaginal contraction Neuromuscular re-education: Pelvic floor contraction training:tactile cues to contract in a circular pattern with the therapist finger in the vaginal canal Self-care: Educated patient on the vaginal canal and radiation can make tissue fibrotic and not elongate as much. Educated on continue to use the dilator to lengthen the vaginal canal and look into the Ohnut to place on the penis to reduce the depth of penetration.     12/02/2021 Manual: Internal pelvic floor techniques:No emotional/communication barriers or cognitive limitation. Patient is motivated to learn. Patient understands and agrees with treatment goals and plan. PT explains patient will be examined in standing, sitting, and lying down to see how their muscles and joints work. When they are ready, they will be asked to remove their underwear so PT can examine their perineum. The patient is also given the option of providing their own chaperone as one is not provided in our facility. The patient also  has the right and is explained the right to defer or refuse any part of the evaluation or treatment including the internal exam. With the patient's consent, PT will use one gloved finger to gently assess the muscles of the pelvic floor, seeing how well it contracts and relaxes and if there is muscle symmetry. After, the patient will get dressed and PT and patient will discuss exam findings and plan of care. PT and patient discuss plan of care, schedule, attendance policy and HEP activities.  Going through the vaginal working on the top of the bladder, along the sides of the bladder, top of the  vaginal canal, along the levator ani with hip movements to assist in the stretch of the tissue Exercises: Stretches/mobility:hamstring stretch holding 30 sec bil.  ITB stretch with strap holding 30 sec bil.  Buttery fly stretch with strap holding 30 sec each side    11/27/2021 Manual: Soft tissue mobilization:manual work to bilateral hip adductors.  Internal pelvic floor techniques:No emotional/communication barriers or cognitive limitation. Patient is motivated to learn. Patient understands and agrees with treatment goals and plan. PT explains patient will be examined in standing, sitting, and lying down to see how their muscles and joints work. When they are ready, they will be asked to remove their underwear so PT can examine their perineum. The patient is also given the option of providing their own chaperone as one is not provided in our facility. The patient also has the right and is explained the right to defer or refuse any part of the evaluation or treatment including the internal exam. With the patient's consent, PT will use one gloved finger to gently assess the muscles of the pelvic floor, seeing how well it contracts and relaxes and if there is muscle symmetry. After, the patient will get dressed and PT and patient will discuss exam findings and plan of care. PT and patient discuss plan of care, schedule,  attendance policy and HEP activities.  Going through the vagina working on the introitus, around the bladder along the anterior wall of the vaginal canal, working around the cervix to lengthen the vaginal canal, working along the levator ani and obturator internist  Self-care: Discussed with patient on using the bigger dilator because she is ready. Discussed with her on the use of the handle to work with the dilator and it is easier for her.        PATIENT EDUCATION: 10/21/2021 Education details: Access Code: [email or web address] Person educated: Patient Education method: Explanation, Demonstration, Tactile cues, Verbal cues, and Handouts Education comprehension: verbalized understanding, returned demonstration, verbal cues required, tactile cues required, and needs further education     HOME EXERCISE PROGRAM: 10/21/2021  Access Code: [email or web address], talked about the ohnut and handle for the vaginal dilator URL: [email or web address] Date: 10/21/2021 Prepared by: Earlie Counts   Program Notes use the estrace cream  into the vaginal canal 2 times per week. place it in the canal with the applicator then use your finger to rub it in and the extra rub into the vulvar are. Use the good clean love daily on the vulva area.    Exercises - Hooklying Hamstring Stretch with Strap  - 1 x daily - 7 x weekly - 1 sets - 2 reps - 30 sec hold - Supine ITB Stretch with Strap  - 1 x daily - 7 x weekly - 1 sets - 2 reps - 30 sec hold - Supine Piriformis Stretch Pulling Heel to Hip  - 1 x daily - 7 x weekly - 1 sets - 2 reps - 30 sec hold - Prone Press Up  - 1 x daily - 7 x weekly - 1 sets - 10 reps - Prone Hip Extension with Pillow Under Abdomen  - 1 x daily - 3 x weekly - 2 sets - 10 reps ASSESSMENT:   CLINICAL IMPRESSION: Patient is a 46 y.o. female who was seen today for physical therapy  treatment for Dyspareunia.  Patient is able to fit the larges dilator into the vaginal canal without difficulty on the  diameter of the opening. She continues to have difficulty with the length  of the vaginal canal. Therapist worked the tissue of the pelvic floor in sidely to elongate the vaginal canal and seemed to be helpful. Patient takes several contractions of the pelvic floor to become a 3/5.  Patient will benefit from skilled therapy to reduce pelvic pain, improve tissue extensibility for the radiation and reduce urinary leakage.      OBJECTIVE IMPAIRMENTS decreased activity tolerance, decreased coordination, decreased endurance, decreased strength, increased fascial restrictions, increased muscle spasms, impaired flexibility, and pain.    ACTIVITY LIMITATIONS bending, standing, continence, toileting, and vaginal exam   PARTICIPATION LIMITATIONS: interpersonal relationship, shopping, and community activity   PERSONAL FACTORS Cervical cancer with radiation; diabetes, c-section are also affecting patient's functional outcome.    REHAB POTENTIAL: Good   CLINICAL DECISION MAKING: Evolving/moderate complexity   EVALUATION COMPLEXITY: Moderate     GOALS: Goals reviewed with patient? Yes   SHORT TERM GOALS: Target date: 11/04/2021   Patient understand how to use moisturizers to the vaginal canal to improve the quality of the tissue.  Baseline:not educated yet Goal status: met 10/14/2021    2.  Patient educated on Ohnut to use with her dilator to make it easier to insert and relax with it in the canal.  Baseline: Not educated yet Goal status: Met 11/27/2021   3.  Instructed on how to use the handle for the dilator to place it in the vaginal canal.  Baseline: not educated yet Goal status: Met 11/27/2021   LONG TERM GOALS: Target date: 12/30/2021    Patient is independent with her advanced HEP for pelvic floor stretches, hip strength and pelvic floor coordination.  Baseline: Not educated yet Goal status: Ongoing 12/02/2021   2.  Patient able to have vaginal penetration with pain level </= 2-3/10 due to  improved mobility of the tissue and accommodations using a device or different positions.  Baseline: pain level 8/10 Goal status: ongoing   3.  Patient able to use the largest dilator with pain </= 1-2/10 to continue to remodel the fibrotic tissue of the vaginal wall.  Baseline: pain level 6/10 when using the last 2 dilators at the end of the vaginal canal.  Goal status: Ongoing 12/02/2021   4.  Urinary leakage decreased >/= 70% due to the ability to fully relax and fully contract the pelvic floor and strength >/= 3/5 Baseline: urinary leakage with coughing and sneezing and strength is 3/5 without hug of therapist finger.  Goal status: Ongoing 12/02/2021     PLAN: PT FREQUENCY: 1x/week   PT DURATION: 4 weeks   PLANNED INTERVENTIONS: Therapeutic exercises, Therapeutic activity, Neuromuscular re-education, Patient/Family education, Self Care, Joint mobilization, Dry Needling, Electrical stimulation, Spinal mobilization, Taping, Biofeedback, and Manual therapy   PLAN FOR NEXT SESSION: manual work on the anterior wall, work on pelvic floor contraction; put in medicaid, internal work on the right side,Caidan Hubbert Pearline Cables, PT 12/09/21 1:59 PM

## 2021-12-11 ENCOUNTER — Ambulatory Visit: Payer: Medicaid Other | Admitting: Podiatry

## 2021-12-11 ENCOUNTER — Encounter: Payer: Self-pay | Admitting: Physical Therapy

## 2021-12-11 ENCOUNTER — Encounter: Payer: Self-pay | Admitting: Podiatry

## 2021-12-11 ENCOUNTER — Ambulatory Visit (INDEPENDENT_AMBULATORY_CARE_PROVIDER_SITE_OTHER): Payer: Medicaid Other

## 2021-12-11 DIAGNOSIS — M7752 Other enthesopathy of left foot: Secondary | ICD-10-CM | POA: Diagnosis not present

## 2021-12-11 DIAGNOSIS — M79672 Pain in left foot: Secondary | ICD-10-CM | POA: Diagnosis not present

## 2021-12-11 DIAGNOSIS — L03032 Cellulitis of left toe: Secondary | ICD-10-CM | POA: Diagnosis not present

## 2021-12-11 MED ORDER — DOXYCYCLINE HYCLATE 100 MG PO TABS: 100.0000 mg | ORAL_TABLET | Freq: Two times a day (BID) | ORAL | 1 refills | Status: DC

## 2021-12-14 NOTE — Progress Notes (Signed)
Radiation Oncology         ([phone]) [phone] ________________________________  Name: Tamara Osborne MRN: [id number]  Date: 12/15/2021  DOB: 26-Jun-1975  Follow-Up Visit Note  CC: Kerin Perna, NP  Pll[facility], [facility]  No diagnosis found.  Diagnosis:   Stage II-B squamous cell carcinoma of the cervix  Interval Since Last Radiation: 2 years, 6 months, and 11 days   External beam therapy was from 03/23/2019 - 05/04/2019. HDR vaginal brachytherapy was from 05/19/2019 - 06/05/2019.   Site Technique Total Dose (Gy) Dose per Fx (Gy) Completed Fx Beam Energies  Cervix: Cervix HDR-brachy 5.5/5.5 5.5 1/1 Ir-192  Cervix: Cervix HDR-brachy 5.5/5.5 5.5 1/1 Ir-192  Cervix: Cervix_Bst HDR-brachy 5.5/5.5 5.5 1/1 Ir-192  Cervix: Cervix HDR-brachy 5.5/5.5 5.5 1/1 Ir-192  Cervix: Cervix 3D 45/45 1.8 25/25 10X, 15X  Cervix: Cervix HDR-brachy 5.5/5.5 5.5 1/1 Ir-192  Cervix: Cervix_Bst 3D 9/9 1.8 5/5 15X      Narrative:  The patient returns today for routine 6 month follow-up, she was last seen here for follow-up on 06/09/21. Since her last visit, the patient followed up with Dr. Berline Lopes on 09/01/21. During which time, the patient reported some neuropathy (which has been improving) and occasional spotting, pain, and anxiety related to intercourse. She otherwise denied any symptoms concerning for disease recurrence and was noted as NED on examination. Dr. Berline Lopes noted that the patient had significant improvement in her urinary and pelvic floor symptoms after working with physical therapy. The patient also reported using her vaginal dilator approximately twice a week.  Pap and HPV testing performed during her visit with Dr. Berline Lopes on 09/01/21 was negative.     During her most recent follow-up visit with Dr. Alvy Bimler on 10/23/21, the patient again reported some intermittent pelvic discomfort usually after sexual intercourse. This has been improving with topical estrogen cream use. She otherwise  denied any pelvic pain outside of intercourse, discharge, or bleeding. The patient also consented to getting her port removed. She will no longer require follow-up visits with Dr. Alvy Bimler.            ***              Allergies:  is allergic to penicillins, shellfish allergy, and sulfa antibiotics.  Meds: Current Outpatient Medications  Medication Sig Dispense Refill   Accu-Chek Softclix Lancets lancets Use to check blood sugar TID. 100 each 2   amLODipine-valsartan (EXFORGE) 10-320 MG tablet Take 1 tablet by mouth daily. 90 tablet 1   atorvastatin (LIPITOR) 10 MG tablet Take 1 tablet (10 mg total) by mouth daily. 90 tablet 0   Blood Glucose Monitoring Suppl (ACCU-CHEK GUIDE) w/Device KIT Use to check blood sugar TID. 1 kit 0   chlorhexidine (PERIDEX) 0.12 % solution SMARTSIG:By Mouth     conjugated estrogens (PREMARIN) vaginal cream Place 1 Applicatorful vaginally 3 (three) times a week. 42.5 g 1   doxycycline (VIBRA-TABS) 100 MG tablet Take 1 tablet (100 mg total) by mouth 2 (two) times daily. 20 tablet 1   fluticasone (FLONASE) 50 MCG/ACT nasal spray Place 1 spray into both nostrils daily as needed for allergies or rhinitis.     glucose blood (ACCU-CHEK GUIDE) test strip Use to check blood sugar TID. 100 each 2   Insulin Pen Needle (BD PEN NEEDLE NANO 2ND GEN) 32G X 4 MM MISC 1 Package by Does not apply route 2 (two) times daily. 100 each 0   lidocaine-prilocaine (EMLA) cream Apply 1 application topically as needed. Apply  to port access daily as needed 30 g 3   Misc. Devices MISC CMP with GFR 1 ampule 0   mupirocin ointment (BACTROBAN) 2 % 1 application 3 (three) times daily.     Semaglutide,0.25 or 0.5MG/DOS, (OZEMPIC, 0.25 OR 0.5 MG/DOSE,) 2 MG/1.5ML SOPN Inject 0.5 mg into the skin once a week. 3 mL 0   No current facility-administered medications for this encounter.    Physical Findings: The patient is in no acute distress. Patient is alert and oriented.  vitals were not taken for  this visit. .  No significant changes. Lungs are clear to auscultation bilaterally. Heart has regular rate and rhythm. No palpable cervical, supraclavicular, or axillary adenopathy. Abdomen soft, non-tender, normal bowel sounds.  On pelvic examination the external genitalia were unremarkable. A speculum exam was performed. There are no mucosal lesions noted in the vaginal vault. A Pap smear was obtained of the proximal vagina. On bimanual and rectovaginal examination there were no pelvic masses appreciated. ***    Lab Findings: Lab Results  Component Value Date   WBC 7.9 10/23/2021   HGB 13.6 10/23/2021   HCT 38.6 10/23/2021   MCV 79.8 (L) 10/23/2021   PLT 278 10/23/2021    Radiographic Findings: No results found.  Impression:  Stage II-B squamous cell carcinoma of the cervix  The patient is recovering from the effects of radiation.  ***  Plan:  ***   *** minutes of total time was spent for this patient encounter, including preparation, face-to-face counseling with the patient and coordination of care, physical exam, and documentation of the encounter. ____________________________________  Blair Promise, PhD, MD  This document serves as a record of services personally performed by Gery Pray, MD. It was created on his behalf by Roney Mans, a trained medical scribe. The creation of this record is based on the scribe's personal observations and the provider's statements to them. This document has been checked and approved by the attending provider.

## 2021-12-15 ENCOUNTER — Encounter: Payer: Self-pay | Admitting: Radiation Oncology

## 2021-12-15 ENCOUNTER — Ambulatory Visit
Admission: RE | Admit: 2021-12-15 | Discharge: 2021-12-15 | Disposition: A | Discharge status: Home / Self Care | Payer: Medicaid Other | Source: Ambulatory Visit | Attending: Radiation Oncology | Admitting: Radiation Oncology

## 2021-12-15 ENCOUNTER — Other Ambulatory Visit: Payer: Self-pay

## 2021-12-15 VITALS — BP 160/105 | HR 95 | Temp 98.0°F | Resp 18 | Ht 62.0 in

## 2021-12-15 DIAGNOSIS — Z8541 Personal history of malignant neoplasm of cervix uteri: Secondary | ICD-10-CM | POA: Insufficient documentation

## 2021-12-15 DIAGNOSIS — Z79899 Other long term (current) drug therapy: Secondary | ICD-10-CM | POA: Insufficient documentation

## 2021-12-15 DIAGNOSIS — R140 Abdominal distension (gaseous): Secondary | ICD-10-CM | POA: Diagnosis not present

## 2021-12-15 DIAGNOSIS — N393 Stress incontinence (female) (male): Secondary | ICD-10-CM | POA: Insufficient documentation

## 2021-12-15 DIAGNOSIS — Z923 Personal history of irradiation: Secondary | ICD-10-CM | POA: Insufficient documentation

## 2021-12-15 DIAGNOSIS — C539 Malignant neoplasm of cervix uteri, unspecified: Secondary | ICD-10-CM

## 2021-12-15 NOTE — Progress Notes (Signed)
Tamara Osborne is here today for follow up post radiation to the pelvic.  They completed their radiation on: 06/05/19   Does the patient complain of any of the following:  Pain: Patient reports having a "pinching" , soreness to pelvic area when coughing or sneezing. Patient also reports having bilateral foot pain.  Abdominal bloating: Yes Diarrhea/Constipation: Yes, diarrhea.  Nausea/Vomiting: No Vaginal Discharge: No Blood in Urine or Stool: No Urinary Issues (dysuria/incomplete emptying/ incontinence/ increased frequency/urgency): Yes, Reports bladder leakage and urgency. Reports mild burning after using estrogen cream.    Does patient report using vaginal dilator 2-3 times a week and/or sexually active 2-3 weeks: Yes, patient using dilators. Patient reports blood and pain after using dilators. Patient continues to go to physical therapy.   Post radiation skin changes: Yes, continues to have tenderness to groin.    Additional comments if applicable:    BP (!) 379/024 (BP Location: Left Arm, Patient Position: Sitting)   Pulse 95   Temp 98 F (36.7 C) (Oral)   Resp 18   Ht '5\' 2"'$  (1.575 m)   SpO2 100%   BMI 40.24 kg/m

## 2021-12-18 ENCOUNTER — Encounter: Payer: Medicaid Other | Admitting: Physical Therapy

## 2021-12-18 ENCOUNTER — Encounter: Payer: Self-pay | Admitting: Physical Therapy

## 2021-12-18 DIAGNOSIS — N393 Stress incontinence (female) (male): Secondary | ICD-10-CM

## 2021-12-18 DIAGNOSIS — R102 Pelvic and perineal pain: Secondary | ICD-10-CM

## 2021-12-18 DIAGNOSIS — M6281 Muscle weakness (generalized): Secondary | ICD-10-CM | POA: Diagnosis not present

## 2021-12-18 DIAGNOSIS — Z483 Aftercare following surgery for neoplasm: Secondary | ICD-10-CM

## 2021-12-18 NOTE — Therapy (Signed)
OUTPATIENT PHYSICAL THERAPY TREATMENT NOTE   Patient Name: Tamara Osborne MRN: [id number] DOB:13-Sep-1975, 46 y.o., female Today's Date: 12/18/2021  PCP: Kerin Perna, NP REFERRING PROVIDER: [name], MD  END OF SESSION:   PT End of Session - 12/18/21 1251     Visit Number 10    Date for PT Re-Evaluation 02/10/22    Authorization Type [employer]    Authorization Time Period 12/05/2021-02/02/2022    Authorization - Visit Number 2    Authorization - Number of Visits 5    PT Start Time 1130    PT Stop Time 1215    PT Time Calculation (min) 45 min    Activity Tolerance Patient tolerated treatment well    Behavior During Therapy WFL for tasks assessed/performed             Past Medical History:  Diagnosis Date   Anemia    Anxiety    Bartholin cyst    Cervical cancer ([place])    Chronic kidney disease    Constipation    Depression    Deviated septum    Diabetes mellitus without complication ([place])    Difficult intravenous access    used port for 05-09-2019 surgery   Fibroids    History of blood transfusion    2 units given 04-26-2019, has had total of 8 units since jan 2021   History of blood transfusion 1 unit given 05-30-19   iv fluids also given   History of kidney problems    History of radiation therapy 03/23/2019-05/04/2019   Cervical external beam   Dr Gery Pray   History of radiation therapy 05/09/2019-06/05/2019   vaginal brachytherapy   Dr Gery Pray   History of recent blood transfusion 05/16/2019   2 units given  per dr Pollyann Savoy at cancer center   Hypertension    Neuropathy    both thumbs   Obesity    Palpitations    PONV (postoperative nausea and vomiting)    Prediabetes    Preeclampsia 2006   Sleep apnea    no cpap used insurance would not cover, osa severe per pt   SOB (shortness of breath)    Swallowing difficulty    Past Surgical History:  Procedure Laterality Date   CESAREAN SECTION WITH BILATERAL TUBAL LIGATION  11/10/2004        DILATION AND CURETTAGE OF UTERUS  1997   IR IMAGING GUIDED PORT INSERTION  04/04/2019   IR REMOVAL TUN ACCESS W/ PORT W/O FL MOD SED  10/27/2021   OPERATIVE ULTRASOUND N/A 05/09/2019   Procedure: OPERATIVE ULTRASOUND;  Surgeon: Gery Pray, MD;  Location: [facility];  Service: Urology;  Laterality: N/A;   OPERATIVE ULTRASOUND N/A 05/15/2019   Procedure: OPERATIVE ULTRASOUND;  Surgeon: Gery Pray, MD;  Location: [facility];  Service: Urology;  Laterality: N/A;   OPERATIVE ULTRASOUND N/A 05/22/2019   Procedure: OPERATIVE ULTRASOUND;  Surgeon: Gery Pray, MD;  Location: [facility];  Service: Urology;  Laterality: N/A;   OPERATIVE ULTRASOUND N/A 05/29/2019   Procedure: OPERATIVE ULTRASOUND;  Surgeon: Gery Pray, MD;  Location: [facility];  Service: Urology;  Laterality: N/A;   OPERATIVE ULTRASOUND N/A 06/05/2019   Procedure: OPERATIVE ULTRASOUND;  Surgeon: Gery Pray, MD;  Location: [facility];  Service: Urology;  Laterality: N/A;   TANDEM RING INSERTION N/A 05/09/2019   Procedure: TANDEM RING INSERTION;  Surgeon: Gery Pray, MD;  Location: [facility];  Service: Urology;  Laterality: N/A;   TANDEM RING INSERTION N/A 05/15/2019   Procedure: TANDEM RING INSERTION;  Surgeon: Gery Pray, MD;  Location: [facility];  Service: Urology;  Laterality: N/A;   TANDEM RING INSERTION N/A 05/22/2019   Procedure: TANDEM RING INSERTION;  Surgeon: Gery Pray, MD;  Location: [facility];  Service: Urology;  Laterality: N/A;   TANDEM RING INSERTION N/A 05/29/2019   Procedure: TANDEM RING INSERTION;  Surgeon: Gery Pray, MD;  Location: [facility];  Service: Urology;  Laterality: N/A;   TANDEM RING INSERTION N/A 06/05/2019   Procedure: TANDEM RING INSERTION;  Surgeon: Gery Pray, MD;  Location: [facility];  Service: Urology;  Laterality: N/A;    Patient Active Problem List   Diagnosis Date Noted   Dyspareunia in female 10/23/2021   Type 2 diabetes mellitus with hyperglycemia, without long-term current use of insulin ([place]) 07/03/2021   Hyperlipidemia associated with type 2 diabetes mellitus ([place]) 07/03/2021   Type 2 diabetes mellitus without complication, without long-term current use of insulin ([place]) 11/15/2020   Incontinence of urine in female 09/09/2020   Onychomycosis of toenail 07/31/2020   OSA (obstructive sleep apnea) 07/31/2020   Prediabetes 05/27/2020   Sinus congestion 04/16/2020   Port-A-Cath in place 01/22/2020   Other hyperlipidemia 01/10/2020   Vitamin D deficiency 01/10/2020   Insulin resistance 01/10/2020   Class 3 severe obesity with serious comorbidity and body mass index (BMI) of 40.0 to 44.9 in adult ([place]) 01/10/2020   Obesity, Class II, BMI 35-39.9 09/07/2019   Dehydration 05/30/2019   Peripheral neuropathy due to chemotherapy ([place]) 04/25/2019   Nausea without vomiting 04/18/2019   Diarrhea 04/04/2019   CKD (chronic kidney disease), symptom management only, stage 3 (moderate) ([place]) 03/30/2019   Generalized anxiety disorder 03/30/2019   Hypertension associated with type 2 diabetes mellitus ([place])    Anxiety 03/23/2019   Squamous cell carcinoma of cervix ([place]) 03/23/2019   Deficiency anemia 03/22/2019   REFERRING DIAG:  C53.9 (ICD-10-CM) - Malignant neoplasm of cervix, unspecified site ([place])  N94.10 (ICD-10-CM) - Dyspareunia in female      THERAPY DIAG:  Muscle weakness (generalized)   Pelvic pain   Stress incontinence of urine   Aftercare following surgery for neoplasm   Rationale for Evaluation and Treatment Rehabilitation   ONSET DATE: 07/07/2021   SUBJECTIVE:                                                                                                                                                                                            SUBJECTIVE STATEMENT: I saw the doctor and  they were happy with my progress. He saw radiation changes at the proximal vaginal vault. Patient has not had penile penetration vaginally. Recently.    PAIN:  Are you having pain? Yes NPRS scale: 8/10 Pain location: Vaginal   Pain type: aching, burning, sharp, and tight Pain description: intermittent    Aggravating factors: penile penetration; using the larger dilator, vaginal exam Relieving factors: no vaginal penetration   PRECAUTIONS: Other: cancer   WEIGHT BEARING RESTRICTIONS No   FALLS:  Has patient fallen in last 6 months? No   LIVING ENVIRONMENT: Lives with: lives with their partner   OCCUPATION: part time   PLOF: Independent   PATIENT GOALS reduce pain with vaginal penetration   PERTINENT HISTORY:  Cervical cancer with radiation; diabetes, c-section     BOWEL MOVEMENT Pain with bowel movement: No   URINATION Pain with urination: No Fully empty bladder: Yes:   Stream:  average Urgency: Yes: some watches what she drinks when she goes for a car ride Frequency: can wait 3 hours Leakage: Coughing and Sneezing Pads: Yes: when in a car for a distance   INTERCOURSE Pain with intercourse: During Penetration, After Intercourse, and after gets a soreness Ability to have vaginal penetration:  Yes: not always comfortable to have the penis deeper Climax: not since her cancer Marinoff Scale: 2/3   PREGNANCY Vaginal deliveries 3 Tearing No C-section deliveries 1 Currently pregnant No       OBJECTIVE:    DIAGNOSTIC FINDINGS:  none   PATIENT SURVEYS:  12/02/2021 PFIQ-7 48; UIQ-7 38, POPIQ-7 10   COGNITION:            Overall cognitive status: Within functional limits for tasks assessed                          SENSATION:            Light touch: Appears intact            Proprioception: Appears intact   MUSCLE LENGTH: Hamstrings: Right decreased  deg; Left decreased deg Thomas test: Right positive deg; Left positive deg                  POSTURE:  rounded shoulders, forward head, decreased lumbar lordosis, and increased abdominal size               PELVIC ALIGNMENT: correct alignment   LUMBARAROM/PROM   A/PROM A/PROM  eval 11/27/2021 12/02/2021 12/18/2021  Flexion full full full full  Extension Decreased by 50% Decreased by 25% Decreased by 25% Decreased by 25%  Right lateral flexion Decreased by 25% full full full  Left lateral flexion Decreased by 25% full full full  Right rotation Decreased by 25% full full full  Left rotation Decreased by 25% full full full   (Blank rows = not tested)   LOWER EXTREMITY ROM:   Active ROM Right eval Left eval Right 12/02/2021 Left 12/02/2021  Hip extension 0 0 5 5   (Blank rows = not tested)   LOWER EXTREMITY MMT:   MMT Right eval Left eval Right 12/02/2021 Left 12/02/2021  Hip extension 3+/5 3+/5 4/5 4/5  Hip abduction 3+/5 4/5 4+/5 5/5     PALPATION:   General  tenderness suprapubically, bilaterally hip adductor by the groin,                  External Perineal Exam along the perineal body, ishiocavernosis, bulbocavernosus  Internal Pelvic Floor along the introitus, levator ani, obturator internist, side of the bladder and urethra, and at the vaginal vault. Therapist touched the vaginal vault with her index finger that Is 4 inches long   Patient confirms identification and approves PT to assess internal pelvic floor and treatment Yes   PELVIC MMT:   MMT eval 10/21/2021 11/20/2021 12/09/2021 12/18/2021  Vaginal 2/5 but not hug of therapist finger 3/5 with weak lift and hug of therapist finger 3/5  with circular weak hug 3/5 with weak hug 3/5 with stronger hug of therapist finger  (Blank rows = not tested)         TONE: increased   PROLAPSE: None noted   TODAY'S TREATMENT  12/18/2021 Manual: Internal pelvic floor techniques:No emotional/communication barriers or cognitive limitation. Patient is motivated to learn. Patient understands and agrees  with treatment goals and plan. PT explains patient will be examined in standing, sitting, and lying down to see how their muscles and joints work. When they are ready, they will be asked to remove their underwear so PT can examine their perineum. The patient is also given the option of providing their own chaperone as one is not provided in our facility. The patient also has the right and is explained the right to defer or refuse any part of the evaluation or treatment including the internal exam. With the patient's consent, PT will use one gloved finger to gently assess the muscles of the pelvic floor, seeing how well it contracts and relaxes and if there is muscle symmetry. After, the patient will get dressed and PT and patient will discuss exam findings and plan of care. PT and patient discuss plan of care, schedule, attendance policy and HEP activities.  Going through the vaginal canal working on the top  of the vaginal canal and the anterior and posterior portion using fascial release and stretching the tissue to elongate the tissue. Working on the tissue above the bladder to soften. The tissue and change the restrictions from radiation. Therapist was able to place her index finger fully into the vaginal canal for 6 inches.  Patient was able to cough with pelvic floor contraction and no urinary leakage was seen.      12/09/2021 Manual: Internal pelvic floor techniques:No emotional/communication barriers or cognitive limitation. Patient is motivated to learn. Patient understands and agrees with treatment goals and plan. PT explains patient will be examined in standing, sitting, and lying down to see how their muscles and joints work. When they are ready, they will be asked to remove their underwear so PT can examine their perineum. The patient is also given the option of providing their own chaperone as one is not provided in our facility. The patient also has the right and is explained the right to defer  or refuse any part of the evaluation or treatment including the internal exam. With the patient's consent, PT will use one gloved finger to gently assess the muscles of the pelvic floor, seeing how well it contracts and relaxes and if there is muscle symmetry. After, the patient will get dressed and PT and patient will discuss exam findings and plan of care. PT and patient discuss plan of care, schedule, attendance policy and HEP activities.  Therapist finger in the vaginal canal releasing the top of the bladder, along the cervix, along the anterior vaginal wall going slowly due to the fibrotic tissue Manual work along the levator ani, along the pubic rami, pubovaginalis tl elongate and facilitate a  vaginal contraction Neuromuscular re-education: Pelvic floor contraction training:tactile cues to contract in a circular pattern with the therapist finger in the vaginal canal Self-care: Educated patient on the vaginal canal and radiation can make tissue fibrotic and not elongate as much. Educated on continue to use the dilator to lengthen the vaginal canal and look into the Ohnut to place on the penis to reduce the depth of penetration.     12/02/2021 Manual: Internal pelvic floor techniques:No emotional/communication barriers or cognitive limitation. Patient is motivated to learn. Patient understands and agrees with treatment goals and plan. PT explains patient will be examined in standing, sitting, and lying down to see how their muscles and joints work. When they are ready, they will be asked to remove their underwear so PT can examine their perineum. The patient is also given the option of providing their own chaperone as one is not provided in our facility. The patient also has the right and is explained the right to defer or refuse any part of the evaluation or treatment including the internal exam. With the patient's consent, PT will use one gloved finger to gently assess the muscles of the pelvic floor,  seeing how well it contracts and relaxes and if there is muscle symmetry. After, the patient will get dressed and PT and patient will discuss exam findings and plan of care. PT and patient discuss plan of care, schedule, attendance policy and HEP activities.  Going through the vaginal working on the top of the bladder, along the sides of the bladder, top of the vaginal canal, along the levator ani with hip movements to assist in the stretch of the tissue Exercises: Stretches/mobility:hamstring stretch holding 30 sec bil.  ITB stretch with strap holding 30 sec bil.  Buttery fly stretch with strap holding 30 sec each side       PATIENT EDUCATION: 10/21/2021 Education details: Access Code: [email or web address] Person educated: Patient Education method: Explanation, Demonstration, Tactile cues, Verbal cues, and Handouts Education comprehension: verbalized understanding, returned demonstration, verbal cues required, tactile cues required, and needs further education     HOME EXERCISE PROGRAM: 10/21/2021  Access Code: [email or web address], talked about the ohnut and handle for the vaginal dilator URL: [email or web address] Date: 10/21/2021 Prepared by: Earlie Counts   Program Notes use the estrace cream  into the vaginal canal 2 times per week. place it in the canal with the applicator then use your finger to rub it in and the extra rub into the vulvar are. Use the good clean love daily on the vulva area.    Exercises - Hooklying Hamstring Stretch with Strap  - 1 x daily - 7 x weekly - 1 sets - 2 reps - 30 sec hold - Supine ITB Stretch with Strap  - 1 x daily - 7 x weekly - 1 sets - 2 reps - 30 sec hold - Supine Piriformis Stretch Pulling Heel to Hip  - 1 x daily - 7 x weekly - 1 sets - 2 reps - 30 sec hold - Prone Press Up  - 1 x daily - 7 x weekly - 1 sets - 10 reps - Prone Hip Extension with Pillow Under Abdomen  - 1 x daily - 3 x weekly - 2 sets - 10 reps ASSESSMENT:   CLINICAL IMPRESSION: Patient  is a 46 y.o. female who was seen today for physical therapy  treatment for Dyspareunia.  Patient is using the handle with the dilator an dis easier to place the dilator into the vaginal  canal. Patient is able to place the second to largest flush with the vaginal opening. The largest dilator is not able to go all the way in yet. Patient was able to cough without urinary leakage with therapist palpating the pelvic floor. Pelvic floor strength is 3/5 and she is able to contract the pelvic floor with coughing.  Patient will benefit from skilled therapy to reduce pelvic pain, improve tissue extensibility for the radiation and reduce urinary leakage.      OBJECTIVE IMPAIRMENTS decreased activity tolerance, decreased coordination, decreased endurance, decreased strength, increased fascial restrictions, increased muscle spasms, impaired flexibility, and pain.    ACTIVITY LIMITATIONS bending, standing, continence, toileting, and vaginal exam   PARTICIPATION LIMITATIONS: interpersonal relationship, shopping, and community activity   PERSONAL FACTORS Cervical cancer with radiation; diabetes, c-section are also affecting patient's functional outcome.    REHAB POTENTIAL: Good   CLINICAL DECISION MAKING: Evolving/moderate complexity   EVALUATION COMPLEXITY: Moderate     GOALS: Goals reviewed with patient? Yes   SHORT TERM GOALS: Target date: 11/04/2021   Patient understand how to use moisturizers to the vaginal canal to improve the quality of the tissue.  Baseline:not educated yet Goal status: met 10/14/2021    2.  Patient educated on Ohnut to use with her dilator to make it easier to insert and relax with it in the canal.  Baseline: Not educated yet Goal status: Met 11/27/2021   3.  Instructed on how to use the handle for the dilator to place it in the vaginal canal.  Baseline: not educated yet Goal status: Met 11/27/2021   LONG TERM GOALS: Target date: 12/30/2021    Patient is independent with  her advanced HEP for pelvic floor stretches, hip strength and pelvic floor coordination.  Baseline: Not educated yet Goal status: Ongoing 12/02/2021   2.  Patient able to have vaginal penetration with pain level </= 2-3/10 due to improved mobility of the tissue and accommodations using a device or different positions.  Baseline: pain level 8/10 Goal status: ongoing   3.  Patient able to use the largest dilator with pain </= 1-2/10 to continue to remodel the fibrotic tissue of the vaginal wall.  Baseline: pain level 6/10 when using the last 2 dilators at the end of the vaginal canal.  Goal status: Ongoing 12/02/2021   4.  Urinary leakage decreased >/= 70% due to the ability to fully relax and fully contract the pelvic floor and strength >/= 3/5 Baseline: urinary leakage with coughing and sneezing and strength is 3/5 without hug of therapist finger.  Goal status: Met 12/02/2021     PLAN: PT FREQUENCY: 1x/week   PT DURATION: 4 weeks   PLANNED INTERVENTIONS: Therapeutic exercises, Therapeutic activity, Neuromuscular re-education, Patient/Family education, Self Care, Joint mobilization, Dry Needling, Electrical stimulation, Spinal mobilization, Taping, Biofeedback, and Manual therapy   PLAN FOR NEXT SESSION: manual work on the anterior wall, work on pelvic floor contraction;  internal work on the right side,   Earlie Counts, PT 12/18/21 1:08 PM

## 2021-12-22 ENCOUNTER — Encounter (INDEPENDENT_AMBULATORY_CARE_PROVIDER_SITE_OTHER): Payer: Self-pay | Admitting: Family Medicine

## 2021-12-22 ENCOUNTER — Ambulatory Visit (INDEPENDENT_AMBULATORY_CARE_PROVIDER_SITE_OTHER): Payer: Medicaid Other | Admitting: Family Medicine

## 2021-12-22 VITALS — BP 152/87 | HR 77 | Temp 97.6°F | Ht 62.0 in | Wt 225.0 lb

## 2021-12-22 DIAGNOSIS — Z7985 Long-term (current) use of injectable non-insulin antidiabetic drugs: Secondary | ICD-10-CM

## 2021-12-22 DIAGNOSIS — E669 Obesity, unspecified: Secondary | ICD-10-CM

## 2021-12-22 DIAGNOSIS — Z6841 Body Mass Index (BMI) 40.0 and over, adult: Secondary | ICD-10-CM

## 2021-12-22 DIAGNOSIS — I10 Essential (primary) hypertension: Secondary | ICD-10-CM | POA: Insufficient documentation

## 2021-12-22 DIAGNOSIS — E1165 Type 2 diabetes mellitus with hyperglycemia: Secondary | ICD-10-CM

## 2021-12-22 MED ORDER — OZEMPIC (0.25 OR 0.5 MG/DOSE) 2 MG/1.5ML ~~LOC~~ SOPN
0.5000 mg | PEN_INJECTOR | SUBCUTANEOUS | 0 refills | Status: DC
Start: 2021-12-22 — End: 2022-01-20

## 2021-12-24 NOTE — Patient Instructions (Addendum)
Please continue using your CPAP regularly. While your insurance requires that you use CPAP at least 4 hours each night on 70% of the nights, I recommend, that you not skip any nights and use it throughout the night if you can. Getting used to CPAP and staying with the treatment long term does take time and patience and discipline. Untreated obstructive sleep apnea when it is moderate to severe can have an adverse impact on cardiovascular health and raise her risk for heart disease, arrhythmias, hypertension, congestive heart failure, stroke and diabetes. Untreated obstructive sleep apnea causes sleep disruption, nonrestorative sleep, and sleep deprivation. This can have an impact on your day to day functioning and cause daytime sleepiness and impairment of cognitive function, memory loss, mood disturbance, and problems focussing. Using CPAP regularly can improve these symptoms.  Let PCP know about cough, wheezing and congestion. Try to limit daytime naps. Increase physical activity. Continue working on [employer]. Monitor BP at home.   Follow up in 1 year

## 2021-12-24 NOTE — Progress Notes (Signed)
PATIENT: Tamara Osborne DOB: 24-Sep-1975  REASON FOR VISIT: follow up HISTORY FROM: patient  Chief Complaint  Patient presents with   Follow-up    Rm 16, pt doing well on CPAP. DME [employer]. Luna machine     HISTORY OF PRESENT ILLNESS:  12/29/21 ALL:  Tamara Osborne returns for follow up for OSA on CPAP. She was last seen 05/2021 and reported sleeping 12/13 hours daily. She was encouraged to continue close follow up with PCP, oncology and weight management. Since, she reports doing ok. She was doing really good with exercise and weight management until the end of August when they removed her port. She has not gotten back into the habit of exercising. She reports losing about 10-12lbs but has gained about 5lbs back. She is hoping to get back to the [facility] this week. She reports BP has been pretty good. She just took her amlodipine about 30 minutes ago. Home readings are usually 120-130/80-90. She continues to sleep very well. She is sleeping about 13 hours a day. She usually sleeps about 10 hours at night but then will nap for 2-3 hours every day. She does continue to have a cough and congestion. Some wheezing. She reports having significant allergies requiring allergy injections in past. She has not seen PCP for this.      06/25/2021 ALL:  Tamara Osborne is a 46 y.o. female here today for follow up for OSA on CPAP.  She is doing well on therapy. She is using CPAP most every night for about 10 hours. She reports sleeping very well. She feels that she is sleeping so deeply that she is having urinary incontinence at night. She has a history of cervical cancer with radiation and chemo that has effected functioning of bladder. She reports sleeping up to 12-13 hours some nights. She reports more condensation in mask and feels that she may have more chest congestion on nights where she has slept longer. This occurs about 1-2 times a month. She usually goes to bed around 9pm. She likes to get up early  in the morning but sometimes sleeps until 9:30. She has not been as active. She has gained 15 pounds. She does meet with her cancer support group regularly during the week. She likes to swim. She reports BP is usually well managed. She just took morning BP medication while waiting for out visit. She is no longer having to take clonidine.     HISTORY: (copied from Dr Guadelupe Sabin previous note)  Tamara Osborne is a 46 year old right-handed woman with an underlying medical history of palpitations, hypertension, neuropathy, diabetes, history of cervical cancer, anxiety, anemia, and obesity, who presents for follow-up consultation of her obstructive sleep apnea after interim home sleep testing and starting AutoPap therapy.  The patient is unaccompanied today.  I first met her at the request of Dr. Adair Patter on 11/21/2019, at which time she reported a prior diagnosis of sleep apnea but she has never been on CPAP therapy before as insurance did not cover her machine.  She had a home sleep test on 12/10/2020 which indicated severe sleep apnea with an AHI of 37.8/h, O2 nadir 86%.  She was advised to start AutoPap therapy, her set up date was 12/22/2020.   Today, 06/25/2020: I reviewed her AutoPap compliance data from 05/24/2020 through 06/23/2020, which is a total of 31 minutes, during which time she used her machine every night with percent use days greater than 4 hours at 90.3%, indicating excellent compliance, average  usage of 6 hours and 14 minutes, residual AHI at goal at 0.2/h, leak and no, 95th percentile of pressure at 8 cm.  She reports having adjusted well to treatment.  She uses a DreamWear fullface mask, size small.  She likes this mask.  She has benefited from treatment quite significantly.  She reports better quality sleep, more sleep consolidation, less daytime somnolence to the point where she does not need to take any naps.  Previously, she would take longer naps.  She also has noticed improvement in her  nocturia.  She still has issues with urinary incontinence since her cervical cancer surgeries and she has benefited from physical therapy for this.  She does have to wear protection at night.  She has also noticed improvement in her blood pressure.  In fact, she has not needed to use her clonidine since starting her AutoPap.  She is working on weight loss, she has been on Victoza.  She is highly motivated to continue with her AutoPap.   The patient's allergies, current medications, family history, past medical history, past social history, past surgical history and problem list were reviewed and updated as appropriate.    Previously:    11/21/19: (She) was diagnosed with obstructive sleep apnea several years ago.  She reports significant daytime somnolence.  Prior sleep study results are not available for my review today.  She reports that sleep study testing was done in [place] about 7 or 8 years ago.  She qualified for CPAP therapy but her insurance would not cover it at the time.  She has had weight fluctuations.  She was diagnosed with cervical cancer earlier in the year and underwent surgery, radiation and chemotherapy.  She had lost weight, down to 180 pounds but gained a lot of weight back.  She is in the process of trying to lose weight.  She recently started Victoza. I reviewed your office note from 10/19/2019.  Her Epworth sleepiness score is 14 out of 24 today, fatigue severity score is 32 out of 63.  His snoring is loud and disturbs her boyfriend.  She lives with her boyfriend, they have 1 dog in the household.  She has 3 grown children, ages 32, 49 and 51.  She is currently not working.  She is hoping to go back to work after October.  She has an appointment with her oncologist in early October.  Bedtime is around 9 or 9:30 PM and rise time generally around 8 or 9.  She does wake up around 5 or 5:30 AM to let the dog out but goes back to bed.  She has nocturia about once per average night, she  denies recurrent morning headaches.  She has 1 niece with sleep apnea.  She is trying to reduce her caffeine intake.  She drinks approximately 1 serving per day currently.  She does not drink alcohol, she is a non-smoker.  She would be willing to get rechecked for sleep apnea and consider CPAP therapy.   REVIEW OF SYSTEMS: Out of a complete 14 system review of symptoms, the patient complains only of the following symptoms, urinary incontinence, weight gain and all other reviewed systems are negative.  ESS: 15/24, previously 14  ALLERGIES: Allergies  Allergen Reactions   Penicillins     Not sure a child allergy   Shellfish Allergy Swelling    Seafood also any kind   Sulfa Antibiotics     Not sure a child allergy    HOME MEDICATIONS: Outpatient Medications  Prior to Visit  Medication Sig Dispense Refill   Accu-Chek Softclix Lancets lancets Use to check blood sugar TID. 100 each 2   amLODipine-valsartan (EXFORGE) 10-320 MG tablet Take 1 tablet by mouth daily. 90 tablet 1   atorvastatin (LIPITOR) 10 MG tablet Take 1 tablet (10 mg total) by mouth daily. 90 tablet 0   Blood Glucose Monitoring Suppl (ACCU-CHEK GUIDE) w/Device KIT Use to check blood sugar TID. 1 kit 0   chlorhexidine (PERIDEX) 0.12 % solution 5-10 mLs 2 (two) times a week.     conjugated estrogens (PREMARIN) vaginal cream Place 1 Applicatorful vaginally 3 (three) times a week. 42.5 g 1   fluticasone (FLONASE) 50 MCG/ACT nasal spray Place 1 spray into both nostrils daily as needed for allergies or rhinitis.     glucose blood (ACCU-CHEK GUIDE) test strip Use to check blood sugar TID. 100 each 2   Insulin Pen Needle (BD PEN NEEDLE NANO 2ND GEN) 32G X 4 MM MISC 1 Package by Does not apply route 2 (two) times daily. 100 each 0   Misc. Devices MISC CMP with GFR 1 ampule 0   mupirocin ointment (BACTROBAN) 2 % 1 application 3 (three) times daily.     Semaglutide,0.25 or 0.5MG/DOS, (OZEMPIC, 0.25 OR 0.5 MG/DOSE,) 2 MG/1.5ML SOPN Inject  0.5 mg into the skin once a week. 3 mL 0   doxycycline (VIBRA-TABS) 100 MG tablet Take 1 tablet (100 mg total) by mouth 2 (two) times daily. 20 tablet 1   lidocaine-prilocaine (EMLA) cream Apply 1 application topically as needed. Apply to port access daily as needed 30 g 3   No facility-administered medications prior to visit.    PAST MEDICAL HISTORY: Past Medical History:  Diagnosis Date   Anemia    Anxiety    Bartholin cyst    Cervical cancer ([place])    Chronic kidney disease    Constipation    Depression    Deviated septum    Diabetes mellitus without complication ([place])    Difficult intravenous access    used port for 05-09-2019 surgery   Fibroids    History of blood transfusion    2 units given 04-26-2019, has had total of 8 units since jan 2021   History of blood transfusion 1 unit given 05-30-19   iv fluids also given   History of kidney problems    History of radiation therapy 03/23/2019-05/04/2019   Cervical external beam   Dr Gery Pray   History of radiation therapy 05/09/2019-06/05/2019   vaginal brachytherapy   Dr Gery Pray   History of recent blood transfusion 05/16/2019   2 units given  per dr Pollyann Savoy at cancer center   Hypertension    Neuropathy    both thumbs   Obesity    Palpitations    PONV (postoperative nausea and vomiting)    Prediabetes    Preeclampsia 2006   Sleep apnea    no cpap used insurance would not cover, osa severe per pt   SOB (shortness of breath)    Swallowing difficulty     PAST SURGICAL HISTORY: Past Surgical History:  Procedure Laterality Date   CESAREAN SECTION WITH BILATERAL TUBAL LIGATION  11/10/2004       DILATION AND CURETTAGE OF UTERUS  1997   IR IMAGING GUIDED PORT INSERTION  04/04/2019   IR REMOVAL TUN ACCESS W/ PORT W/O FL MOD SED  10/27/2021   OPERATIVE ULTRASOUND N/A 05/09/2019   Procedure: OPERATIVE ULTRASOUND;  Surgeon: Gery Pray, MD;  Location: [place];  Service: Urology;  Laterality: N/A;    OPERATIVE ULTRASOUND N/A 05/15/2019   Procedure: OPERATIVE ULTRASOUND;  Surgeon: Gery Pray, MD;  Location[facility];  Service: Urology;  Laterality: N/A;   OPERATIVE ULTRASOUND N/A 05/22/2019   Procedure: OPERATIVE ULTRASOUND;  Surgeon: Gery Pray, MD;  Location: [facility];  Service: Urology;  Laterality: N/A;   OPERATIVE ULTRASOUND N/A 05/29/2019   Procedure: OPERATIVE ULTRASOUND;  Surgeon: Gery Pray, MD;  Location: [facility];  Service: Urology;  Laterality: N/A;   OPERATIVE ULTRASOUND N/A 06/05/2019   Procedure: OPERATIVE ULTRASOUND;  Surgeon: Gery Pray, MD;  Location: [facility];  Service: Urology;  Laterality: N/A;   TANDEM RING INSERTION N/A 05/09/2019   Procedure: TANDEM RING INSERTION;  Surgeon: Gery Pray, MD;  Location: [facility];  Service: Urology;  Laterality: N/A;   TANDEM RING INSERTION N/A 05/15/2019   Procedure: TANDEM RING INSERTION;  Surgeon: Gery Pray, MD;  Location: [facility];  Service: Urology;  Laterality: N/A;   TANDEM RING INSERTION N/A 05/22/2019   Procedure: TANDEM RING INSERTION;  Surgeon: Gery Pray, MD;  Location: [facility];  Service: Urology;  Laterality: N/A;   TANDEM RING INSERTION N/A 05/29/2019   Procedure: TANDEM RING INSERTION;  Surgeon: Gery Pray, MD;  Location: [facility];  Service: Urology;  Laterality: N/A;   TANDEM RING INSERTION N/A 06/05/2019   Procedure: TANDEM RING INSERTION;  Surgeon: Gery Pray, MD;  Location: [facility];  Service: Urology;  Laterality: N/A;    FAMILY HISTORY: Family History  Problem Relation Age of Onset   Brain cancer Mother        lung cancer to brain   Hypertension Mother    Cancer Mother    Depression Mother    Anxiety disorder Mother    Diabetes Father    Kidney disease Father    Cancer Father        kidney cancer   Heart failure Sister      SOCIAL HISTORY: Social History   Socioeconomic History   Marital status: Single    Spouse name: Not on file   Number of children: 4   Years of education: Not on file   Highest education level: Not on file  Occupational History   Not on file  Tobacco Use   Smoking status: Never   Smokeless tobacco: Never  Vaping Use   Vaping Use: Never used  Substance and Sexual Activity   Alcohol use: No   Drug use: No   Sexual activity: Not Currently    Birth control/protection: Surgical  Other Topics Concern   Not on file  Social History Narrative   Lives with her boyfriend   Social Determinants of Health   Financial Resource Strain: Not on file  Food Insecurity: Not on file  Transportation Needs: Not on file  Physical Activity: Not on file  Stress: Not on file  Social Connections: Not on file  Intimate Partner Violence: Not on file     PHYSICAL EXAM  Vitals:   12/29/21 1033  BP: (!) 175/93  Pulse: 88  Weight: 227 lb (103 kg)  Height: 5' (1.524 m)    Body mass index is 44.33 kg/m.  Generalized: Well developed, in no acute distress  Cardiology: normal rate and rhythm, no murmur noted Respiratory: clear to auscultation bilaterally  Neurological examination  Mentation: Alert oriented to time, place, history taking.  Follows all commands speech and language fluent Cranial nerve II-XII: Pupils were equal round reactive to light. Extraocular movements were full, visual field were full  Motor: The motor testing reveals 5 over 5 strength of all 4 extremities. Good symmetric motor tone is noted throughout.  Gait and station: Gait is normal.    DIAGNOSTIC DATA (LABS, IMAGING, TESTING) - I reviewed patient records, labs, notes, testing and imaging myself where available.      No data to display           Lab Results  Component Value Date   WBC 7.9 10/23/2021   HGB 13.6 10/23/2021   HCT 38.6 10/23/2021   MCV 79.8 (L) 10/23/2021   PLT 278 10/23/2021       Component Value Date/Time   NA 138 10/23/2021 1218   NA 140 09/20/2019 1106   K 4.0 10/23/2021 1218   CL 106 10/23/2021 1218   CO2 27 10/23/2021 1218   GLUCOSE 186 (H) 10/23/2021 1218   BUN 22 (H) 10/23/2021 1218   BUN 20 09/20/2019 1106   CREATININE 1.21 (H) 10/23/2021 1218   CREATININE 1.44 (H) 12/12/2020 1108   CALCIUM 9.3 10/23/2021 1218   PROT 7.1 10/23/2021 1218   PROT 7.9 09/20/2019 1106   ALBUMIN 4.1 10/23/2021 1218   ALBUMIN 4.5 09/20/2019 1106   AST 18 10/23/2021 1218   AST 49 (H) 12/12/2020 1108   ALT 33 10/23/2021 1218   ALT 70 (H) 12/12/2020 1108   ALKPHOS 84 10/23/2021 1218   BILITOT 0.4 10/23/2021 1218   BILITOT 1.0 12/12/2020 1108   GFRNONAA 56 (L) 10/23/2021 1218   GFRNONAA 46 (L) 12/12/2020 1108   GFRAA 55 (L) 09/20/2019 1106   Lab Results  Component Value Date   CHOL 221 (H) 06/05/2021   HDL 37 (L) 06/05/2021   LDLCALC 158 (H) 06/05/2021   TRIG 130 06/05/2021   CHOLHDL 6.0 06/05/2021   Lab Results  Component Value Date   HGBA1C 5.4 06/05/2021   No results found for: "VITAMINB12" Lab Results  Component Value Date   TSH 1.910 09/20/2019     ASSESSMENT AND PLAN 46 y.o. year old female  has a past medical history of Anemia, Anxiety, Bartholin cyst, Cervical cancer ([place]), Chronic kidney disease, Constipation, Depression, Deviated septum, Diabetes mellitus without complication ([place]), Difficult intravenous access, Fibroids, History of blood transfusion, History of blood transfusion (1 unit given 05-30-19), History of kidney problems, History of radiation therapy (03/23/2019-05/04/2019), History of radiation therapy (05/09/2019-06/05/2019), History of recent blood transfusion (05/16/2019), Hypertension, Neuropathy, Obesity, Palpitations, PONV (postoperative nausea and vomiting), Prediabetes, Preeclampsia (2006), Sleep apnea, SOB (shortness of breath), and Swallowing difficulty. here with     ICD-10-CM   1. OSA on CPAP  G47.33 For home use only DME continuous  positive airway pressure (CPAP)        DOYLENE SPLINTER is doing well on CPAP therapy. Compliance report reveals excellent compliance. She was encouraged to continue using CPAP nightly and for greater than 4 hours each night. We have discussed cough, chest congestion and blood pressures. She was encouraged to follow up closely with PCP. We could consider stimulant for EDS if blood pressure managed. She will continue to increase physical activity. She is followed by weight management and will continue focusing on healthy weight. She will monitor BP at home and follow up with PCP if readings are not consistently below 140/90. We will update supply orders as indicated. Risks of untreated sleep apnea review and education materials  provided. Healthy lifestyle habits encouraged. She will follow up in 6 months, sooner if needed. She verbalizes understanding and agreement with this plan.    Orders Placed This Encounter  Procedures   For home use only DME continuous positive airway pressure (CPAP)    Supplies    Order Specific Question:   Length of Need    Answer:   Lifetime    Order Specific Question:   Patient has OSA or probable OSA    Answer:   Yes    Order Specific Question:   Is the patient currently using CPAP in the home    Answer:   Yes    Order Specific Question:   Settings    Answer:   Other see comments    Order Specific Question:   CPAP supplies needed    Answer:   Mask, headgear, cushions, filters, heated tubing and water chamber     No orders of the defined types were placed in this encounter.     Debbora Presto, FNP-C 12/29/2021, 11:15 AM [facility] [address], [place], [place] [address] [phone]

## 2021-12-25 ENCOUNTER — Encounter: Payer: Self-pay | Admitting: *Deleted

## 2021-12-28 NOTE — Progress Notes (Unsigned)
Chief Complaint:   OBESITY Tamara Osborne is here to discuss her progress with her obesity treatment plan along with follow-up of her obesity related diagnoses. Tamara Osborne is on the Category 3 Plan and states she is following her eating plan approximately 90% of the time. Tamara Osborne states she is doing 0 minutes 0 times per week.  Today's visit was #: 34 Starting weight: 208 lbs Starting date: 09/20/2019 Today's weight: 225 lbs Today's date: 12/22/2021 Total lbs lost to date: 0 Total lbs lost since last in-office visit: 0  Interim History: Annesha has had extra challenges with her foot injury and this has decreased her exercise.  She is open to trying other eating plans as she is struggling to meet her protein goals.  Subjective:   1. Type 2 diabetes mellitus with hyperglycemia, without long-term current use of insulin (HCC) Tamara Osborne's recent A1c has improved at 5.4. She is working on her diet and weight loss.   2. Essential hypertension Tamara Osborne's blood pressure is elevated today. She is in pain however. Her blood pressure is normally better controlled.   Assessment/Plan:   1. Type 2 diabetes mellitus with hyperglycemia, without long-term current use of insulin (HCC) Tamara Osborne will continue Ozempic 0.5 mg once weekly, and we will refill for 1 month.   - Semaglutide,0.25 or 0.'5MG'$ /DOS, (OZEMPIC, 0.25 OR 0.5 MG/DOSE,) 2 MG/1.5ML SOPN; Inject 0.5 mg into the skin once a week.  Dispense: 3 mL; Refill: 0  2. Essential hypertension Tamara Osborne will continue her medications and diet, and we will recheck her blood pressure at her next visit in 3-4 weeks.   3. Obesity, Current BMI 41.2 Tamara Osborne is currently in the action stage of change. As such, her goal is to continue with weight loss efforts. She has agreed to keeping a food journal and adhering to recommended goals of 1300-1500 calories and 85+ grams of protein daily.   Behavioral modification strategies: increasing lean protein  intake.  Tamara Osborne has agreed to follow-up with our clinic in 3 to 4 weeks. She was informed of the importance of frequent follow-up visits to maximize her success with intensive lifestyle modifications for her multiple health conditions.   Objective:   Blood pressure (!) 152/87, pulse 77, temperature 97.6 F (36.4 C), height '5\' 2"'$  (1.575 m), weight 225 lb (102.1 kg), SpO2 91 %. Body mass index is 41.15 kg/m.  General: Cooperative, alert, well developed, in no acute distress. HEENT: Conjunctivae and lids unremarkable. Cardiovascular: Regular rhythm.  Lungs: Normal work of breathing. Neurologic: No focal deficits.   Lab Results  Component Value Date   CREATININE 1.21 (H) 10/23/2021   BUN 22 (H) 10/23/2021   NA 138 10/23/2021   K 4.0 10/23/2021   CL 106 10/23/2021   CO2 27 10/23/2021   Lab Results  Component Value Date   ALT 33 10/23/2021   AST 18 10/23/2021   ALKPHOS 84 10/23/2021   BILITOT 0.4 10/23/2021   Lab Results  Component Value Date   HGBA1C 5.4 06/05/2021   HGBA1C 5.2 01/13/2021   HGBA1C 7.5 (H) 10/18/2020   HGBA1C 6.0 (H) 05/20/2020   HGBA1C 5.2 10/30/2019   Lab Results  Component Value Date   INSULIN 40.3 (H) 09/20/2019   Lab Results  Component Value Date   TSH 1.910 09/20/2019   Lab Results  Component Value Date   CHOL 221 (H) 06/05/2021   HDL 37 (L) 06/05/2021   LDLCALC 158 (H) 06/05/2021   TRIG 130 06/05/2021   CHOLHDL 6.0 06/05/2021  Lab Results  Component Value Date   VD25OH 66.26 06/05/2021   VD25OH 59.37 10/18/2020   VD25OH 45.60 05/20/2020   Lab Results  Component Value Date   WBC 7.9 10/23/2021   HGB 13.6 10/23/2021   HCT 38.6 10/23/2021   MCV 79.8 (L) 10/23/2021   PLT 278 10/23/2021   Lab Results  Component Value Date   IRON 17 (L) 03/27/2019   TIBC 283 03/27/2019   FERRITIN 32 03/27/2019   Attestation Statements:   Reviewed by clinician on day of visit: allergies, medications, problem list, medical history, surgical  history, family history, social history, and previous encounter notes.   I, Tamara Osborne, am acting as transcriptionist for Dennard Nip, MD.  I have reviewed the above documentation for accuracy and completeness, and I agree with the above. -  Dennard Nip, MD

## 2021-12-29 ENCOUNTER — Encounter: Payer: Self-pay | Admitting: Family Medicine

## 2021-12-29 ENCOUNTER — Ambulatory Visit: Payer: Medicaid Other | Admitting: Family Medicine

## 2021-12-29 ENCOUNTER — Other Ambulatory Visit: Payer: Self-pay | Admitting: Podiatry

## 2021-12-29 VITALS — BP 175/93 | HR 88 | Ht 60.0 in | Wt 227.0 lb

## 2021-12-29 DIAGNOSIS — G4733 Obstructive sleep apnea (adult) (pediatric): Secondary | ICD-10-CM

## 2021-12-29 DIAGNOSIS — M7752 Other enthesopathy of left foot: Secondary | ICD-10-CM

## 2021-12-30 ENCOUNTER — Telehealth: Payer: Self-pay

## 2021-12-30 ENCOUNTER — Encounter: Payer: Self-pay | Admitting: Physical Therapy

## 2021-12-30 ENCOUNTER — Encounter (INDEPENDENT_AMBULATORY_CARE_PROVIDER_SITE_OTHER): Payer: Self-pay | Admitting: Primary Care

## 2021-12-30 ENCOUNTER — Ambulatory Visit (INDEPENDENT_AMBULATORY_CARE_PROVIDER_SITE_OTHER): Payer: Medicaid Other | Admitting: Primary Care

## 2021-12-30 ENCOUNTER — Encounter: Payer: Medicaid Other | Admitting: Physical Therapy

## 2021-12-30 VITALS — BP 168/89 | HR 102 | Resp 16 | Wt 227.6 lb

## 2021-12-30 DIAGNOSIS — Z483 Aftercare following surgery for neoplasm: Secondary | ICD-10-CM

## 2021-12-30 DIAGNOSIS — N393 Stress incontinence (female) (male): Secondary | ICD-10-CM

## 2021-12-30 DIAGNOSIS — Z6841 Body Mass Index (BMI) 40.0 and over, adult: Secondary | ICD-10-CM | POA: Diagnosis not present

## 2021-12-30 DIAGNOSIS — I10 Essential (primary) hypertension: Secondary | ICD-10-CM

## 2021-12-30 DIAGNOSIS — R102 Pelvic and perineal pain: Secondary | ICD-10-CM

## 2021-12-30 DIAGNOSIS — R0981 Nasal congestion: Secondary | ICD-10-CM | POA: Diagnosis not present

## 2021-12-30 DIAGNOSIS — M6281 Muscle weakness (generalized): Secondary | ICD-10-CM

## 2021-12-30 MED ORDER — GUAIFENESIN 200 MG PO TABS: 200.0000 mg | ORAL_TABLET | ORAL | 0 refills | Status: DC | PRN

## 2021-12-30 MED ORDER — LEVOCETIRIZINE DIHYDROCHLORIDE 5 MG PO TABS: 5.0000 mg | ORAL_TABLET | Freq: Every evening | ORAL | 1 refills | Status: DC

## 2021-12-30 MED ORDER — CHLORTHALIDONE 25 MG PO TABS: 25.0000 mg | ORAL_TABLET | Freq: Every day | ORAL | 1 refills | Status: DC

## 2021-12-30 MED ORDER — FLUTICASONE PROPIONATE 50 MCG/ACT NA SUSP: 1.0000 | Freq: Every day | NASAL | 3 refills | Status: DC | PRN

## 2021-12-30 NOTE — Progress Notes (Signed)
[place], is a 46 y.o. female  EXH:[id number]  DOB - 21-Aug-1975   Subjective:   Tamara Osborne is a 46 y.o. female here today for acute allergy complaints with sinus pain that have been occurring for 14 days. Patient has No chest pain, No abdominal pain - No Nausea, No new weakness tingling or numbness, She has shortness of breath and cough with phelm that feels like it will not come up. Seen Neurologist because patient thought problem was CPAP  Denies/ Admits positive sick exposure or precipitating event.  Describes congestion as constant/ intermittent with clear sputum.  Has tried not OTC just steaming water provides temporary relief.  Symptoms are made worse with day, exertion shortness of breath and cough .  Reports/ denies previous symptoms in the past. Denies fever, chills, fatigue, sore throat, SOB, wheezing, chest pain, nausea, changes in bowel or bladder habits.    No problems updated.  Allergies  Allergen Reactions   Penicillins     Not sure a child allergy   Shellfish Allergy Swelling    Seafood also any kind   Sulfa Antibiotics     Not sure a child allergy    Past Medical History:  Diagnosis Date   Anemia    Anxiety    Bartholin cyst    Cervical cancer ([place])    Chronic kidney disease    Constipation    Depression    Deviated septum    Diabetes mellitus without complication ([place])    Difficult intravenous access    used port for 05-09-2019 surgery   Fibroids    History of blood transfusion    2 units given 04-26-2019, has had total of 8 units since jan 2021   History of blood transfusion 1 unit given 05-30-19   iv fluids also given   History of kidney problems    History of radiation therapy 03/23/2019-05/04/2019   Cervical external beam   Dr Gery Pray   History of radiation therapy 05/09/2019-06/05/2019   vaginal brachytherapy   Dr Gery Pray   History of recent blood transfusion 05/16/2019   2 units given  per dr  Pollyann Savoy at cancer center   Hypertension    Neuropathy    both thumbs   Obesity    Palpitations    PONV (postoperative nausea and vomiting)    Prediabetes    Preeclampsia 2006   Sleep apnea    no cpap used insurance would not cover, osa severe per pt   SOB (shortness of breath)    Swallowing difficulty     Current Outpatient Medications on File Prior to Visit  Medication Sig Dispense Refill   Accu-Chek Softclix Lancets lancets Use to check blood sugar TID. 100 each 2   amLODipine-valsartan (EXFORGE) 10-320 MG tablet Take 1 tablet by mouth daily. 90 tablet 1   atorvastatin (LIPITOR) 10 MG tablet Take 1 tablet (10 mg total) by mouth daily. 90 tablet 0   Blood Glucose Monitoring Suppl (ACCU-CHEK GUIDE) w/Device KIT Use to check blood sugar TID. 1 kit 0   chlorhexidine (PERIDEX) 0.12 % solution 5-10 mLs 2 (two) times a week.     conjugated estrogens (PREMARIN) vaginal cream Place 1 Applicatorful vaginally 3 (three) times a week. 42.5 g 1   glucose blood (ACCU-CHEK GUIDE) test strip Use to check blood sugar TID. 100 each 2   Insulin Pen Needle (BD PEN NEEDLE NANO 2ND GEN) 32G X 4 MM MISC  1 Package by Does not apply route 2 (two) times daily. 100 each 0   Misc. Devices MISC CMP with GFR 1 ampule 0   mupirocin ointment (BACTROBAN) 2 % 1 application 3 (three) times daily.     Semaglutide,0.25 or 0.5MG/DOS, (OZEMPIC, 0.25 OR 0.5 MG/DOSE,) 2 MG/1.5ML SOPN Inject 0.5 mg into the skin once a week. 3 mL 0   No current facility-administered medications on file prior to visit.     Objective:   Vitals:   12/30/21 1135  BP: (!) 168/89  Pulse: (!) 102  Resp: 16  SpO2: 96%  Weight: 227 lb 9.6 oz (103.2 kg)    Exam General appearance : Awake, alert, not in any distress. Speech Clear. Not toxic looking HEENT: Atraumatic and Normocephalic, pupils equally reactive to light and accomodation Neck: Supple, no JVD. No cervical lymphadenopathy.  Chest: Good air entry bilaterally, no added sounds   CVS: S1 S2 regular, no murmurs.  Abdomen: Bowel sounds present, Non tender and not distended with no gaurding, rigidity or rebound. Extremities: B/L Lower Ext shows no edema, both legs are warm to touch Neurology: Awake alert, and oriented X 3, Non focal Skin: No Rash   Assessment & Plan  Tashawnda was seen today for cough.  Diagnoses and all orders for this visit:  Class 3 severe obesity with serious comorbidity and body mass index (BMI) of 40.0 to 44.9 in adult, unspecified obesity type ([place]), CURRENT BMI 39.9 Discussed diet and exercise for person with BMI >25. Instructed: You must burn more calories than you eat. Losing 5 percent of your body weight should be considered a success. In the longer term, losing more than 15 percent of your body weight and staying at this weight is an extremely good result. However, keep in mind that even losing 5 percent of your body weight leads to important health benefits, so try not to get discouraged if you're not able to lose more than this. Will recheck weight in 3-6 months.   Essential hypertension BP goal - < 130/80 Explained that having normal blood pressure is the goal and medications are helping to get to goal and maintain normal blood pressure. DIET: Limit salt intake, read nutrition labels to check salt content, limit fried and high fatty foods  Avoid using multisymptom OTC cold preparations that generally contain sudafed which can rise BP. Consult with pharmacist on best cold relief products to use for persons with HTN EXERCISE Discussed incorporating exercise such as walking - 30 minutes most days of the week and can do in 10 minute intervals    Addedchlorthalidone (HYGROTON) 25 MG tablet; Take 1 tablet (25 mg total) by mouth daily.  Other orders -  -     chlorthalidone (HYGROTON) 25 MG tablet; Take 1 tablet (25 mg total) by mouth daily.   Sinus congestion Use Flonase nasal spray for at least duration of your allergy season.  - For  appropriate administration of the nasal spray, clear the nose, use opposite hand for opposite nare, sniff gently, exhale through your mouth. - For maximal effect take these two nasal sprays at least 30 minutes apart. - Continue Allegra, Claritin or Zyrtec each day, as needed. - Drink at least 64 ounces of water each day. - If you have a humidifier use it nightly. - Remove as many irritants/allergies as you are able to, no pets in the bedroom, change air filters in air vents. .  Allergic reaction can occur when your immune system reacts to a  foreign substance -- such as pollen, bee venom or pet dander -- or a food that doesn't cause a reaction in most people.Your immune system produces substances known as antibodies. When you have allergies, your immune system makes antibodies that identify a particular allergen as harmful, even though it isn't. When you come into contact with the allergen, your immune system's reaction can inflame your skin, sinuses, airways or digestive system.  [email or web address]   Return in about 4 weeks (around 01/27/2022) for BP ck .  The patient was given clear instructions to go to ER or return to medical center if symptoms don't improve, worsen or new problems develop. The patient verbalized understanding. The patient was told to call to get lab results if they haven't heard anything in the next week.   This note has been created with [employer], quantity. Any transcriptional errors are unintentional.   Kerin Perna, NP 12/30/2021, 2:27 PM

## 2021-12-30 NOTE — Patient Instructions (Signed)
Calorie Counting for Weight Loss Calories are units of energy. Your body needs a certain number of calories from food to keep going throughout the day. When you eat or drink more calories than your body needs, your body stores the extra calories mostly as fat. When you eat or drink fewer calories than your body needs, your body burns fat to get the energy it needs. Calorie counting means keeping track of how many calories you eat and drink each day. Calorie counting can be helpful if you need to lose weight. If you eat fewer calories than your body needs, you should lose weight. Ask your health care provider what a healthy weight is for you. For calorie counting to work, you will need to eat the right number of calories each day to lose a healthy amount of weight per week. A dietitian can help you figure out how many calories you need in a day and will suggest ways to reach your calorie goal. A healthy amount of weight to lose each week is usually 1-2 lb (0.5-0.9 kg). This usually means that your daily calorie intake should be reduced by 500-750 calories. Eating 1,200-1,500 calories a day can help most women lose weight. Eating 1,500-1,800 calories a day can help most men lose weight. What do I need to know about calorie counting? Work with your health care provider or dietitian to determine how many calories you should get each day. To meet your daily calorie goal, you will need to: Find out how many calories are in each food that you would like to eat. Try to do this before you eat. Decide how much of the food you plan to eat. Keep a food log. Do this by writing down what you ate and how many calories it had. To successfully lose weight, it is important to balance calorie counting with a healthy lifestyle that includes regular activity. Where do I find calorie information?  The number of calories in a food can be found on a Nutrition Facts label. If a food does not have a Nutrition Facts label, try  to look up the calories online or ask your dietitian for help. Remember that calories are listed per serving. If you choose to have more than one serving of a food, you will have to multiply the calories per serving by the number of servings you plan to eat. For example, the label on a package of bread might say that a serving size is 1 slice and that there are 90 calories in a serving. If you eat 1 slice, you will have eaten 90 calories. If you eat 2 slices, you will have eaten 180 calories. How do I keep a food log? After each time that you eat, record the following in your food log as soon as possible: What you ate. Be sure to include toppings, sauces, and other extras on the food. How much you ate. This can be measured in cups, ounces, or number of items. How many calories were in each food and drink. The total number of calories in the food you ate. Keep your food log near you, such as in a pocket-sized notebook or on an app or website on your mobile phone. Some programs will calculate calories for you and show you how many calories you have left to meet your daily goal. What are some portion-control tips? Know how many calories are in a serving. This will help you know how many servings you can have of a certain   food. Use a measuring cup to measure serving sizes. You could also try weighing out portions on a kitchen scale. With time, you will be able to estimate serving sizes for some foods. Take time to put servings of different foods on your favorite plates or in your favorite bowls and cups so you know what a serving looks like. Try not to eat straight from a food's packaging, such as from a bag or box. Eating straight from the package makes it hard to see how much you are eating and can lead to overeating. Put the amount you would like to eat in a cup or on a plate to make sure you are eating the right portion. Use smaller plates, glasses, and bowls for smaller portions and to prevent  overeating. Try not to multitask. For example, avoid watching TV or using your computer while eating. If it is time to eat, sit down at a table and enjoy your food. This will help you recognize when you are full. It will also help you be more mindful of what and how much you are eating. What are tips for following this plan? Reading food labels Check the calorie count compared with the serving size. The serving size may be smaller than what you are used to eating. Check the source of the calories. Try to choose foods that are high in protein, fiber, and vitamins, and low in saturated fat, trans fat, and sodium. Shopping Read nutrition labels while you shop. This will help you make healthy decisions about which foods to buy. Pay attention to nutrition labels for low-fat or fat-free foods. These foods sometimes have the same number of calories or more calories than the full-fat versions. They also often have added sugar, starch, or salt to make up for flavor that was removed with the fat. Make a grocery list of lower-calorie foods and stick to it. Cooking Try to cook your favorite foods in a healthier way. For example, try baking instead of frying. Use low-fat dairy products. Meal planning Use more fruits and vegetables. One-half of your plate should be fruits and vegetables. Include lean proteins, such as chicken, turkey, and fish. Lifestyle Each week, aim to do one of the following: 150 minutes of moderate exercise, such as walking. 75 minutes of vigorous exercise, such as running. General information Know how many calories are in the foods you eat most often. This will help you calculate calorie counts faster. Find a way of tracking calories that works for you. Get creative. Try different apps or programs if writing down calories does not work for you. What foods should I eat?  Eat nutritious foods. It is better to have a nutritious, high-calorie food, such as an avocado, than a food with  few nutrients, such as a bag of potato chips. Use your calories on foods and drinks that will fill you up and will not leave you hungry soon after eating. Examples of foods that fill you up are nuts and nut butters, vegetables, lean proteins, and high-fiber foods such as whole grains. High-fiber foods are foods with more than 5 g of fiber per serving. Pay attention to calories in drinks. Low-calorie drinks include water and unsweetened drinks. The items listed above may not be a complete list of foods and beverages you can eat. Contact a dietitian for more information. What foods should I limit? Limit foods or drinks that are not good sources of vitamins, minerals, or protein or that are high in unhealthy fats. These   include: Candy. Other sweets. Sodas, specialty coffee drinks, alcohol, and juice. The items listed above may not be a complete list of foods and beverages you should avoid. Contact a dietitian for more information. How do I count calories when eating out? Pay attention to portions. Often, portions are much larger when eating out. Try these tips to keep portions smaller: Consider sharing a meal instead of getting your own. If you get your own meal, eat only half of it. Before you start eating, ask for a container and put half of your meal into it. When available, consider ordering smaller portions from the menu instead of full portions. Pay attention to your food and drink choices. Knowing the way food is cooked and what is included with the meal can help you eat fewer calories. If calories are listed on the menu, choose the lower-calorie options. Choose dishes that include vegetables, fruits, whole grains, low-fat dairy products, and lean proteins. Choose items that are boiled, broiled, grilled, or steamed. Avoid items that are buttered, battered, fried, or served with cream sauce. Items labeled as crispy are usually fried, unless stated otherwise. Choose water, low-fat milk,  unsweetened iced tea, or other drinks without added sugar. If you want an alcoholic beverage, choose a lower-calorie option, such as a glass of wine or light beer. Ask for dressings, sauces, and syrups on the side. These are usually high in calories, so you should limit the amount you eat. If you want a salad, choose a garden salad and ask for grilled meats. Avoid extra toppings such as bacon, cheese, or fried items. Ask for the dressing on the side, or ask for olive oil and vinegar or lemon to use as dressing. Estimate how many servings of a food you are given. Knowing serving sizes will help you be aware of how much food you are eating at restaurants. Where to find more information Centers for Disease Control and Prevention: www.cdc.gov [place] Department of Agriculture: myplate.gov Summary Calorie counting means keeping track of how many calories you eat and drink each day. If you eat fewer calories than your body needs, you should lose weight. A healthy amount of weight to lose per week is usually 1-2 lb (0.5-0.9 kg). This usually means reducing your daily calorie intake by 500-750 calories. The number of calories in a food can be found on a Nutrition Facts label. If a food does not have a Nutrition Facts label, try to look up the calories online or ask your dietitian for help. Use smaller plates, glasses, and bowls for smaller portions and to prevent overeating. Use your calories on foods and drinks that will fill you up and not leave you hungry shortly after a meal. This information is not intended to replace advice given to you by your health care provider. Make sure you discuss any questions you have with your health care provider. Document Revised: 03/30/2019 Document Reviewed: 03/30/2019 Elsevier Patient Education  2023 [employer]  

## 2021-12-30 NOTE — Therapy (Signed)
OUTPATIENT PHYSICAL THERAPY TREATMENT NOTE   Patient Name: Tamara Osborne MRN: [id number] DOB:02-Dec-1975, 46 y.o., female Today's Date: 12/30/2021  PCP: Kerin Perna, NP REFERRING PROVIDER: [name], MD  END OF SESSION:   PT End of Session - 12/30/21 1505     Visit Number 11    Date for PT Re-Evaluation 02/10/22    Authorization Type [employer]    Authorization Time Period 12/05/2021-02/02/2022    Authorization - Visit Number 3    Authorization - Number of Visits 5    PT Start Time 1500    PT Stop Time 2979    PT Time Calculation (min) 45 min    Activity Tolerance Patient tolerated treatment well    Behavior During Therapy WFL for tasks assessed/performed             Past Medical History:  Diagnosis Date   Anemia    Anxiety    Bartholin cyst    Cervical cancer ([place])    Chronic kidney disease    Constipation    Depression    Deviated septum    Diabetes mellitus without complication ([place])    Difficult intravenous access    used port for 05-09-2019 surgery   Fibroids    History of blood transfusion    2 units given 04-26-2019, has had total of 8 units since jan 2021   History of blood transfusion 1 unit given 05-30-19   iv fluids also given   History of kidney problems    History of radiation therapy 03/23/2019-05/04/2019   Cervical external beam   Dr Gery Pray   History of radiation therapy 05/09/2019-06/05/2019   vaginal brachytherapy   Dr Gery Pray   History of recent blood transfusion 05/16/2019   2 units given  per dr Pollyann Savoy at cancer center   Hypertension    Neuropathy    both thumbs   Obesity    Palpitations    PONV (postoperative nausea and vomiting)    Prediabetes    Preeclampsia 2006   Sleep apnea    no cpap used insurance would not cover, osa severe per pt   SOB (shortness of breath)    Swallowing difficulty    Past Surgical History:  Procedure Laterality Date   CESAREAN SECTION WITH BILATERAL TUBAL LIGATION  11/10/2004        DILATION AND CURETTAGE OF UTERUS  1997   IR IMAGING GUIDED PORT INSERTION  04/04/2019   IR REMOVAL TUN ACCESS W/ PORT W/O FL MOD SED  10/27/2021   OPERATIVE ULTRASOUND N/A 05/09/2019   Procedure: OPERATIVE ULTRASOUND;  Surgeon: Gery Pray, MD;  Location: [place];  Service: Urology;  Laterality: N/A;   OPERATIVE ULTRASOUND N/A 05/15/2019   Procedure: OPERATIVE ULTRASOUND;  Surgeon: Gery Pray, MD;  Location: [facility];  Service: Urology;  Laterality: N/A;   OPERATIVE ULTRASOUND N/A 05/22/2019   Procedure: OPERATIVE ULTRASOUND;  Surgeon: Gery Pray, MD;  Location: [facility];  Service: Urology;  Laterality: N/A;   OPERATIVE ULTRASOUND N/A 05/29/2019   Procedure: OPERATIVE ULTRASOUND;  Surgeon: Gery Pray, MD;  Location: [facility];  Service: Urology;  Laterality: N/A;   OPERATIVE ULTRASOUND N/A 06/05/2019   Procedure: OPERATIVE ULTRASOUND;  Surgeon: Gery Pray, MD;  Location: [facility];  Service: Urology;  Laterality: N/A;   TANDEM RING INSERTION N/A 05/09/2019   Procedure: TANDEM RING INSERTION;  Surgeon: Gery Pray, MD;  Location: [facility];  Service: Urology;  Laterality: N/A;   TANDEM RING INSERTION N/A 05/15/2019   Procedure: TANDEM RING INSERTION;  Surgeon: Gery Pray, MD;  Location: [facility];  Service: Urology;  Laterality: N/A;   TANDEM RING INSERTION N/A 05/22/2019   Procedure: TANDEM RING INSERTION;  Surgeon: Gery Pray, MD;  Location: [facility];  Service: Urology;  Laterality: N/A;   TANDEM RING INSERTION N/A 05/29/2019   Procedure: TANDEM RING INSERTION;  Surgeon: Gery Pray, MD;  Location: [facility];  Service: Urology;  Laterality: N/A;   TANDEM RING INSERTION N/A 06/05/2019   Procedure: TANDEM RING INSERTION;  Surgeon: Gery Pray, MD;  Location: [facility];  Service: Urology;  Laterality: N/A;    Patient Active Problem List   Diagnosis Date Noted   Essential hypertension 12/22/2021   Dyspareunia in female 10/23/2021   Type 2 diabetes mellitus with hyperglycemia, without long-term current use of insulin ([place]) 07/03/2021   Hyperlipidemia associated with type 2 diabetes mellitus ([place]) 07/03/2021   Type 2 diabetes mellitus without complication, without long-term current use of insulin ([place]) 11/15/2020   Incontinence of urine in female 09/09/2020   Onychomycosis of toenail 07/31/2020   OSA (obstructive sleep apnea) 07/31/2020   Prediabetes 05/27/2020   Sinus congestion 04/16/2020   Port-A-Cath in place 01/22/2020   Other hyperlipidemia 01/10/2020   Vitamin D deficiency 01/10/2020   Insulin resistance 01/10/2020   Class 2 severe obesity with serious comorbidity and body mass index (BMI) of 39.0 to 39.9 in adult ([place]) 01/10/2020   Obesity, Class II, BMI 35-39.9 09/07/2019   Dehydration 05/30/2019   Peripheral neuropathy due to chemotherapy ([place]) 04/25/2019   Nausea without vomiting 04/18/2019   Diarrhea 04/04/2019   CKD (chronic kidney disease), symptom management only, stage 3 (moderate) ([place]) 03/30/2019   Generalized anxiety disorder 03/30/2019   Hypertension associated with type 2 diabetes mellitus ([place])    Anxiety 03/23/2019   Squamous cell carcinoma of cervix ([place]) 03/23/2019   Deficiency anemia 03/22/2019   REFERRING DIAG:  C53.9 (ICD-10-CM) - Malignant neoplasm of cervix, unspecified site ([place])  N94.10 (ICD-10-CM) - Dyspareunia in female      THERAPY DIAG:  Muscle weakness (generalized)   Pelvic pain   Stress incontinence of urine   Aftercare following surgery for neoplasm   Rationale for Evaluation and Treatment Rehabilitation   ONSET DATE: 07/07/2021   SUBJECTIVE:                                                                                                                                                                                             SUBJECTIVE  STATEMENT: No intimacy this weekend.  She can get the second to last dilator all the way in. Not able to place the largest dilator all the way into the vaginal canal. The largest dilator is getting in a little further. Leaked one time when she sneezed.   PAIN:  Are you having pain? Yes NPRS scale: 8/10 Pain location: Vaginal   Pain type: aching, burning, sharp, and tight Pain description: intermittent    Aggravating factors: penile penetration; using the larger dilator, vaginal exam Relieving factors: no vaginal penetration   PRECAUTIONS: Other: cancer   WEIGHT BEARING RESTRICTIONS No   FALLS:  Has patient fallen in last 6 months? No   LIVING ENVIRONMENT: Lives with: lives with their partner   OCCUPATION: part time   PLOF: Independent   PATIENT GOALS reduce pain with vaginal penetration   PERTINENT HISTORY:  Cervical cancer with radiation; diabetes, c-section     BOWEL MOVEMENT Pain with bowel movement: No   URINATION Pain with urination: No Fully empty bladder: Yes:   Stream:  average Urgency: Yes: some watches what she drinks when she goes for a car ride Frequency: can wait 3 hours Leakage: Coughing and Sneezing Pads: Yes: when in a car for a distance   INTERCOURSE Pain with intercourse: During Penetration, After Intercourse, and after gets a soreness Ability to have vaginal penetration:  Yes: not always comfortable to have the penis deeper Climax: not since her cancer Marinoff Scale: 2/3   PREGNANCY Vaginal deliveries 3 Tearing No C-section deliveries 1 Currently pregnant No       OBJECTIVE:    DIAGNOSTIC FINDINGS:  none   PATIENT SURVEYS:  12/02/2021 PFIQ-7 48; UIQ-7 38, POPIQ-7 10   COGNITION:            Overall cognitive status: Within functional limits for tasks assessed                          SENSATION:            Light touch: Appears intact            Proprioception: Appears intact   MUSCLE LENGTH: Hamstrings: Right  decreased  deg; Left decreased deg Thomas test: Right positive deg; Left positive deg                  POSTURE: rounded shoulders, forward head, decreased lumbar lordosis, and increased abdominal size               PELVIC ALIGNMENT: correct alignment   LUMBARAROM/PROM   A/PROM A/PROM  eval 11/27/2021 12/02/2021 12/18/2021  Flexion full full full full  Extension Decreased by 50% Decreased by 25% Decreased by 25% Decreased by 25%  Right lateral flexion Decreased by 25% full full full  Left lateral flexion Decreased by 25% full full full  Right rotation Decreased by 25% full full full  Left rotation Decreased by 25% full full full   (Blank rows = not tested)   LOWER EXTREMITY ROM:   Active ROM Right eval Left eval Right 12/02/2021 Left 12/02/2021  Hip extension 0 0 5 5   (Blank rows = not tested)   LOWER EXTREMITY MMT:   MMT Right eval Left eval Right 12/02/2021 Left 12/02/2021  Hip extension 3+/5 3+/5 4/5 4/5  Hip abduction 3+/5 4/5 4+/5 5/5     PALPATION:   General  tenderness suprapubically, bilaterally hip adductor by the groin,  External Perineal Exam along the perineal body, ishiocavernosis, bulbocavernosus                             Internal Pelvic Floor along the introitus, levator ani, obturator internist, side of the bladder and urethra, and at the vaginal vault. Therapist touched the vaginal vault with her index finger that Is 4 inches long   Patient confirms identification and approves PT to assess internal pelvic floor and treatment Yes   PELVIC MMT:   MMT eval 10/21/2021 11/20/2021 12/09/2021 12/18/2021 12/30/2021  Vaginal 2/5 but not hug of therapist finger 3/5 with weak lift and hug of therapist finger 3/5  with circular weak hug 3/5 with weak hug 3/5 with stronger hug of therapist finger 3/5  (Blank rows = not tested)         TONE: increased   PROLAPSE: None noted   TODAY'S TREATMENT  12/30/2021 Manual: Internal pelvic floor  techniques:No emotional/communication barriers or cognitive limitation. Patient is motivated to learn. Patient understands and agrees with treatment goals and plan. PT explains patient will be examined in standing, sitting, and lying down to see how their muscles and joints work. When they are ready, they will be asked to remove their underwear so PT can examine their perineum. The patient is also given the option of providing their own chaperone as one is not provided in our facility. The patient also has the right and is explained the right to defer or refuse any part of the evaluation or treatment including the internal exam. With the patient's consent, PT will use one gloved finger to gently assess the muscles of the pelvic floor, seeing how well it contracts and relaxes and if there is muscle symmetry. After, the patient will get dressed and PT and patient will discuss exam findings and plan of care. PT and patient discuss plan of care, schedule, attendance policy and HEP activities.  Going through the vaginal canal working on the cervix internally and the other hand works on the cervix and uterus externally on the lower abdomen In right sidely working on the anterior wall of the vaginal canal and posterior wall Using 2 fingers internally working on expanding the vaginal canal to increase the diameter of the vaginal canal and release the restrictions    12/18/2021 Manual: Internal pelvic floor techniques:No emotional/communication barriers or cognitive limitation. Patient is motivated to learn. Patient understands and agrees with treatment goals and plan. PT explains patient will be examined in standing, sitting, and lying down to see how their muscles and joints work. When they are ready, they will be asked to remove their underwear so PT can examine their perineum. The patient is also given the option of providing their own chaperone as one is not provided in our facility. The patient also has the  right and is explained the right to defer or refuse any part of the evaluation or treatment including the internal exam. With the patient's consent, PT will use one gloved finger to gently assess the muscles of the pelvic floor, seeing how well it contracts and relaxes and if there is muscle symmetry. After, the patient will get dressed and PT and patient will discuss exam findings and plan of care. PT and patient discuss plan of care, schedule, attendance policy and HEP activities.  Going through the vaginal canal working on the top  of the vaginal canal and the anterior and posterior portion using fascial release and  stretching the tissue to elongate the tissue. Working on the tissue above the bladder to soften. The tissue and change the restrictions from radiation. Therapist was able to place her index finger fully into the vaginal canal for 6 inches.  Patient was able to cough with pelvic floor contraction and no urinary leakage was seen.       12/09/2021 Manual: Internal pelvic floor techniques:No emotional/communication barriers or cognitive limitation. Patient is motivated to learn. Patient understands and agrees with treatment goals and plan. PT explains patient will be examined in standing, sitting, and lying down to see how their muscles and joints work. When they are ready, they will be asked to remove their underwear so PT can examine their perineum. The patient is also given the option of providing their own chaperone as one is not provided in our facility. The patient also has the right and is explained the right to defer or refuse any part of the evaluation or treatment including the internal exam. With the patient's consent, PT will use one gloved finger to gently assess the muscles of the pelvic floor, seeing how well it contracts and relaxes and if there is muscle symmetry. After, the patient will get dressed and PT and patient will discuss exam findings and plan of care. PT and patient  discuss plan of care, schedule, attendance policy and HEP activities.  Therapist finger in the vaginal canal releasing the top of the bladder, along the cervix, along the anterior vaginal wall going slowly due to the fibrotic tissue Manual work along the levator ani, along the pubic rami, pubovaginalis tl elongate and facilitate a vaginal contraction Neuromuscular re-education: Pelvic floor contraction training:tactile cues to contract in a circular pattern with the therapist finger in the vaginal canal Self-care: Educated patient on the vaginal canal and radiation can make tissue fibrotic and not elongate as much. Educated on continue to use the dilator to lengthen the vaginal canal and look into the Ohnut to place on the penis to reduce the depth of penetration.       PATIENT EDUCATION: 10/21/2021 Education details: Access Code: [email or web address] Person educated: Patient Education method: Explanation, Demonstration, Tactile cues, Verbal cues, and Handouts Education comprehension: verbalized understanding, returned demonstration, verbal cues required, tactile cues required, and needs further education     HOME EXERCISE PROGRAM: 10/21/2021  Access Code: [email or web address], talked about the ohnut and handle for the vaginal dilator URL: [email or web address] Date: 10/21/2021 Prepared by: Earlie Counts   Program Notes use the estrace cream  into the vaginal canal 2 times per week. place it in the canal with the applicator then use your finger to rub it in and the extra rub into the vulvar are. Use the good clean love daily on the vulva area.    Exercises - Hooklying Hamstring Stretch with Strap  - 1 x daily - 7 x weekly - 1 sets - 2 reps - 30 sec hold - Supine ITB Stretch with Strap  - 1 x daily - 7 x weekly - 1 sets - 2 reps - 30 sec hold - Supine Piriformis Stretch Pulling Heel to Hip  - 1 x daily - 7 x weekly - 1 sets - 2 reps - 30 sec hold - Prone Press Up  - 1 x daily - 7 x weekly - 1 sets -  10 reps - Prone Hip Extension with Pillow Under Abdomen  - 1 x daily - 3 x weekly - 2 sets - 10 reps ASSESSMENT:   CLINICAL  IMPRESSION: Patient is a 46 y.o. female who was seen today for physical therapy  treatment for Dyspareunia.  Patient has increased mobility of the cervix. Therapist was able to place two fingers into the vaginal canal due to the diameter has increased.  Patient is able to place the largest diameter into the vaginal canal further. She does not get the sharp pain in the lower abdomen now due to decreased fascial restrictions. She has not been intimate with her partner yet. Patient will benefit from skilled therapy to reduce pelvic pain, improve tissue extensibility for the radiation and reduce urinary leakage.      OBJECTIVE IMPAIRMENTS decreased activity tolerance, decreased coordination, decreased endurance, decreased strength, increased fascial restrictions, increased muscle spasms, impaired flexibility, and pain.    ACTIVITY LIMITATIONS bending, standing, continence, toileting, and vaginal exam   PARTICIPATION LIMITATIONS: interpersonal relationship, shopping, and community activity   PERSONAL FACTORS Cervical cancer with radiation; diabetes, c-section are also affecting patient's functional outcome.    REHAB POTENTIAL: Good   CLINICAL DECISION MAKING: Evolving/moderate complexity   EVALUATION COMPLEXITY: Moderate     GOALS: Goals reviewed with patient? Yes   SHORT TERM GOALS: Target date: 11/04/2021   Patient understand how to use moisturizers to the vaginal canal to improve the quality of the tissue.  Baseline:not educated yet Goal status: met 10/14/2021    2.  Patient educated on Ohnut to use with her dilator to make it easier to insert and relax with it in the canal.  Baseline: Not educated yet Goal status: Met 11/27/2021   3.  Instructed on how to use the handle for the dilator to place it in the vaginal canal.  Baseline: not educated yet Goal status:  Met 11/27/2021   LONG TERM GOALS: Target date: 12/30/2021    Patient is independent with her advanced HEP for pelvic floor stretches, hip strength and pelvic floor coordination.  Baseline: Not educated yet Goal status: Ongoing 12/02/2021   2.  Patient able to have vaginal penetration with pain level </= 2-3/10 due to improved mobility of the tissue and accommodations using a device or different positions.  Baseline: pain level 8/10 Goal status: ongoing   3.  Patient able to use the largest dilator with pain </= 1-2/10 to continue to remodel the fibrotic tissue of the vaginal wall.  Baseline: pain level 6/10 when using the last 2 dilators at the end of the vaginal canal.  Goal status: Ongoing 12/02/2021   4.  Urinary leakage decreased >/= 70% due to the ability to fully relax and fully contract the pelvic floor and strength >/= 3/5 Baseline: urinary leakage with coughing and sneezing and strength is 3/5 without hug of therapist finger.  Goal status: Met 12/02/2021     PLAN: PT FREQUENCY: 1x/week   PT DURATION: 4 weeks   PLANNED INTERVENTIONS: Therapeutic exercises, Therapeutic activity, Neuromuscular re-education, Patient/Family education, Self Care, Joint mobilization, Dry Needling, Electrical stimulation, Spinal mobilization, Taping, Biofeedback, and Manual therapy   PLAN FOR NEXT SESSION: manual work on the anterior wall, work on pelvic floor contraction;  internal work on the right side,  Earlie Counts, PT 12/30/21 3:53 PM

## 2022-01-05 ENCOUNTER — Ambulatory Visit: Payer: Medicaid Other | Admitting: Physical Therapy

## 2022-01-08 IMAGING — MG MM DIGITAL SCREENING BILAT W/ TOMO AND CAD
6 of 12 series · 6 of 36 positions shown · non-contrast
Comparison: None.

CLINICAL DATA: Screening. Baseline.

EXAM:
DIGITAL SCREENING BILATERAL MAMMOGRAM WITH TOMOSYNTHESIS AND CAD
TECHNIQUE: Bilateral screening digital craniocaudal and mediolateral oblique
mammograms were obtained. Bilateral screening digital breast
tomosynthesis was performed. The images were evaluated with
computer-aided detection.

[L MLO synth-2D (1 of 2)]
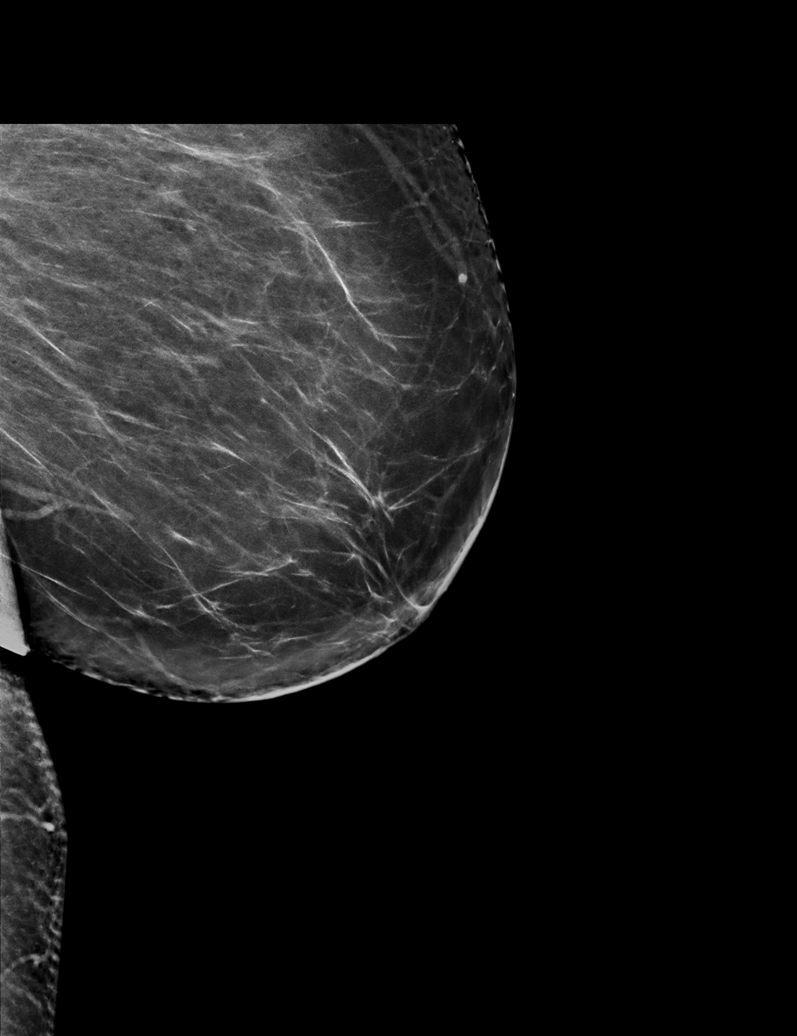

[L CC synth-2D]
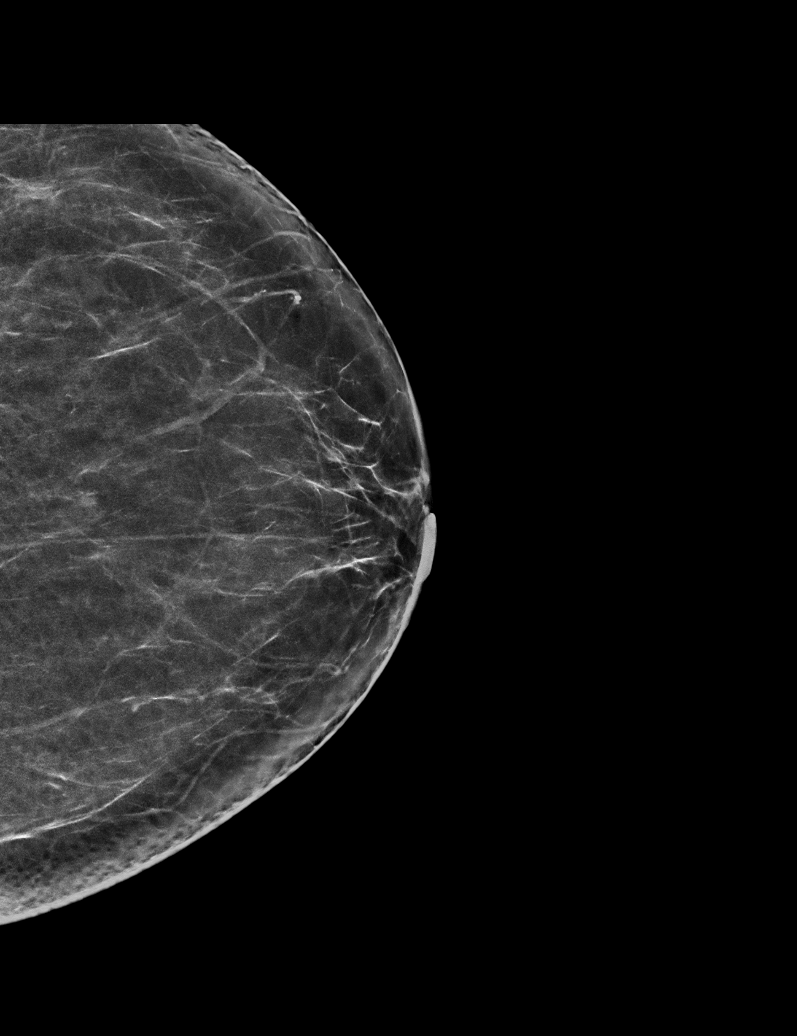

[R MLO synth-2D (1 of 2)]
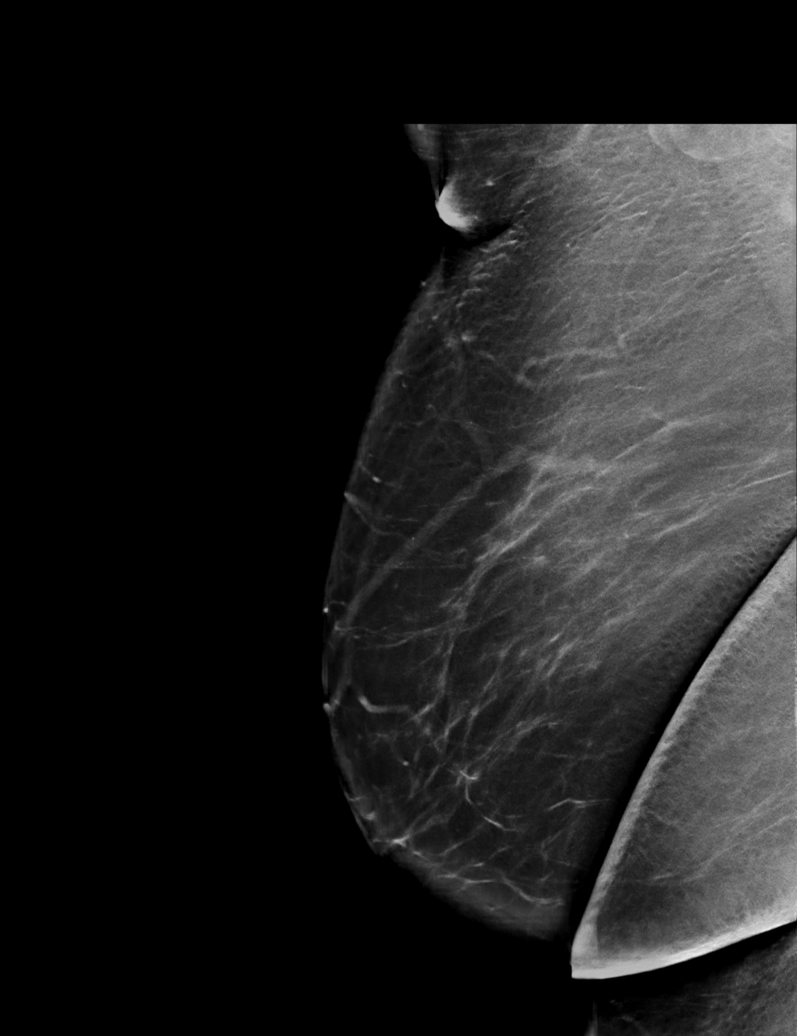

[R CC synth-2D]
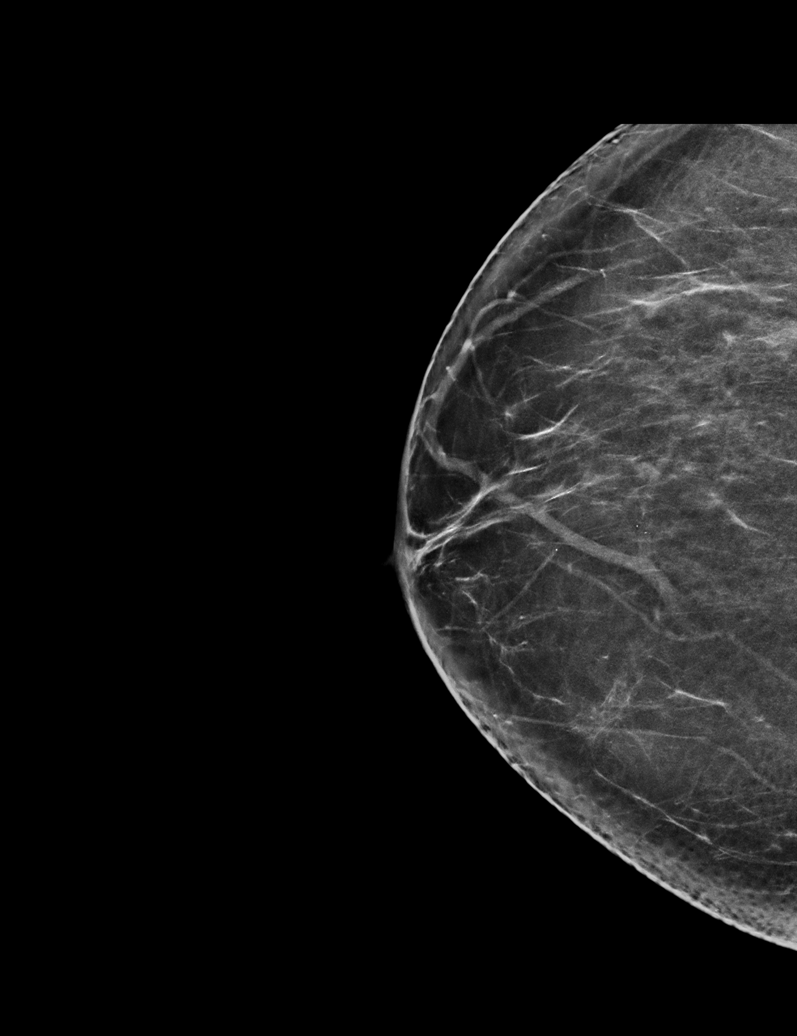

[R MLO synth-2D (2 of 2)]
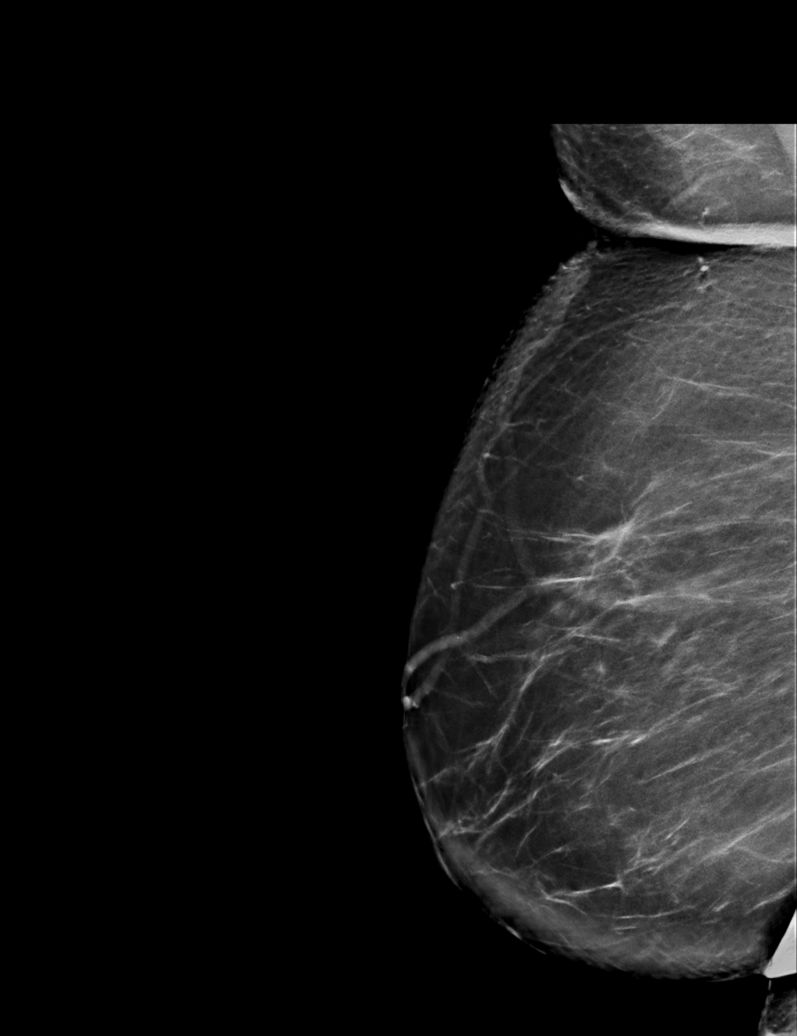

[L MLO synth-2D (2 of 2)]
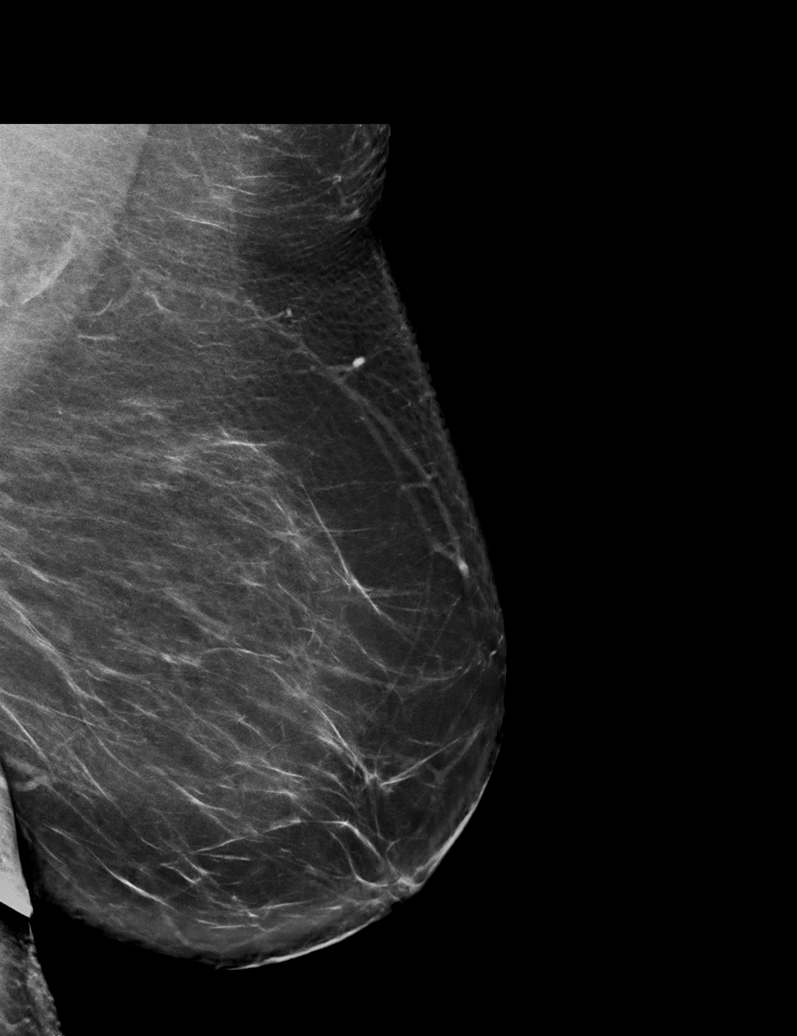

[6 of 36 positions shown; findings below may reference images not displayed]

ACR Breast Density Category b: There are scattered areas of
fibroglandular density.
FINDINGS: There are no findings suspicious for malignancy.
IMPRESSION: No mammographic evidence of malignancy. A result letter of this
screening mammogram will be mailed directly to the patient.

RECOMMENDATION:
Screening mammogram in one year. (Code:35-U-4J2)

BI-RADS CATEGORY  1: Negative.

## 2022-01-13 ENCOUNTER — Other Ambulatory Visit (INDEPENDENT_AMBULATORY_CARE_PROVIDER_SITE_OTHER): Payer: Self-pay | Admitting: Family Medicine

## 2022-01-13 DIAGNOSIS — E1169 Type 2 diabetes mellitus with other specified complication: Secondary | ICD-10-CM

## 2022-01-16 ENCOUNTER — Encounter: Payer: Self-pay | Admitting: Physical Therapy

## 2022-01-19 ENCOUNTER — Ambulatory Visit: Payer: Medicaid Other | Attending: Gynecologic Oncology | Admitting: Physical Therapy

## 2022-01-19 ENCOUNTER — Encounter: Payer: Self-pay | Admitting: Physical Therapy

## 2022-01-19 DIAGNOSIS — N393 Stress incontinence (female) (male): Secondary | ICD-10-CM | POA: Insufficient documentation

## 2022-01-19 DIAGNOSIS — M6281 Muscle weakness (generalized): Secondary | ICD-10-CM | POA: Diagnosis not present

## 2022-01-19 DIAGNOSIS — R102 Pelvic and perineal pain: Secondary | ICD-10-CM | POA: Insufficient documentation

## 2022-01-19 NOTE — Therapy (Signed)
OUTPATIENT PHYSICAL THERAPY TREATMENT NOTE   Patient Name: Tamara Osborne MRN: [id number] DOB:Nov 03, 1975, 46 y.o., female Today's Date: 01/19/2022  PCP: Kerin Perna, NP  REFERRING PROVIDER: [name], MD   END OF SESSION:   PT End of Session - 01/19/22 0847     Visit Number 12    Date for PT Re-Evaluation 02/10/22    Authorization Type [employer]    Authorization Time Period 12/05/2021-02/02/2022    Authorization - Visit Number 4    Authorization - Number of Visits 5    PT Start Time 0845    PT Stop Time 0925    PT Time Calculation (min) 40 min    Activity Tolerance Patient tolerated treatment well    Behavior During Therapy [facility] for tasks assessed/performed             Past Medical History:  Diagnosis Date   Anemia    Anxiety    Bartholin cyst    Cervical cancer ([place])    Chronic kidney disease    Constipation    Depression    Deviated septum    Diabetes mellitus without complication ([place])    Difficult intravenous access    used port for 05-09-2019 surgery   Fibroids    History of blood transfusion    2 units given 04-26-2019, has had total of 8 units since jan 2021   History of blood transfusion 1 unit given 05-30-19   iv fluids also given   History of kidney problems    History of radiation therapy 03/23/2019-05/04/2019   Cervical external beam   Dr Gery Pray   History of radiation therapy 05/09/2019-06/05/2019   vaginal brachytherapy   Dr Gery Pray   History of recent blood transfusion 05/16/2019   2 units given  per dr Pollyann Savoy at cancer center   Hypertension    Neuropathy    both thumbs   Obesity    Palpitations    PONV (postoperative nausea and vomiting)    Prediabetes    Preeclampsia 2006   Sleep apnea    no cpap used insurance would not cover, osa severe per pt   SOB (shortness of breath)    Swallowing difficulty    Past Surgical History:  Procedure Laterality Date   CESAREAN SECTION WITH BILATERAL TUBAL LIGATION   11/10/2004       DILATION AND CURETTAGE OF UTERUS  1997   IR IMAGING GUIDED PORT INSERTION  04/04/2019   IR REMOVAL TUN ACCESS W/ PORT W/O FL MOD SED  10/27/2021   OPERATIVE ULTRASOUND N/A 05/09/2019   Procedure: OPERATIVE ULTRASOUND;  Surgeon: Gery Pray, MD;  Location: [place];  Service: Urology;  Laterality: N/A;   OPERATIVE ULTRASOUND N/A 05/15/2019   Procedure: OPERATIVE ULTRASOUND;  Surgeon: Gery Pray, MD;  Location: [facility];  Service: Urology;  Laterality: N/A;   OPERATIVE ULTRASOUND N/A 05/22/2019   Procedure: OPERATIVE ULTRASOUND;  Surgeon: Gery Pray, MD;  Location: [facility];  Service: Urology;  Laterality: N/A;   OPERATIVE ULTRASOUND N/A 05/29/2019   Procedure: OPERATIVE ULTRASOUND;  Surgeon: Gery Pray, MD;  Location: [facility];  Service: Urology;  Laterality: N/A;   OPERATIVE ULTRASOUND N/A 06/05/2019   Procedure: OPERATIVE ULTRASOUND;  Surgeon: Gery Pray, MD;  Location: [facility];  Service: Urology;  Laterality: N/A;   TANDEM RING INSERTION N/A 05/09/2019   Procedure: TANDEM RING INSERTION;  Surgeon: Gery Pray, MD;  Location: [facility];  Service: Urology;  Laterality: N/A;   TANDEM RING INSERTION N/A 05/15/2019   Procedure: TANDEM RING INSERTION;  Surgeon: Gery Pray, MD;  Location: [facility];  Service: Urology;  Laterality: N/A;   TANDEM RING INSERTION N/A 05/22/2019   Procedure: TANDEM RING INSERTION;  Surgeon: Gery Pray, MD;  Location: [facility];  Service: Urology;  Laterality: N/A;   TANDEM RING INSERTION N/A 05/29/2019   Procedure: TANDEM RING INSERTION;  Surgeon: Gery Pray, MD;  Location: [facility];  Service: Urology;  Laterality: N/A;   TANDEM RING INSERTION N/A 06/05/2019   Procedure: TANDEM RING INSERTION;  Surgeon: Gery Pray, MD;  Location: [facility];  Service: Urology;   Laterality: N/A;   Patient Active Problem List   Diagnosis Date Noted   Essential hypertension 12/22/2021   Dyspareunia in female 10/23/2021   Type 2 diabetes mellitus with hyperglycemia, without long-term current use of insulin ([place]) 07/03/2021   Hyperlipidemia associated with type 2 diabetes mellitus ([place]) 07/03/2021   Type 2 diabetes mellitus without complication, without long-term current use of insulin ([place]) 11/15/2020   Incontinence of urine in female 09/09/2020   Onychomycosis of toenail 07/31/2020   OSA (obstructive sleep apnea) 07/31/2020   Prediabetes 05/27/2020   Sinus congestion 04/16/2020   Port-A-Cath in place 01/22/2020   Other hyperlipidemia 01/10/2020   Vitamin D deficiency 01/10/2020   Insulin resistance 01/10/2020   Class 2 severe obesity with serious comorbidity and body mass index (BMI) of 39.0 to 39.9 in adult ([place]) 01/10/2020   Obesity, Class II, BMI 35-39.9 09/07/2019   Dehydration 05/30/2019   Peripheral neuropathy due to chemotherapy ([place]) 04/25/2019   Nausea without vomiting 04/18/2019   Diarrhea 04/04/2019   CKD (chronic kidney disease), symptom management only, stage 3 (moderate) ([place]) 03/30/2019   Generalized anxiety disorder 03/30/2019   Hypertension associated with type 2 diabetes mellitus ([place])    Anxiety 03/23/2019   Squamous cell carcinoma of cervix ([place]) 03/23/2019   Deficiency anemia 03/22/2019   REFERRING DIAG:  C53.9 (ICD-10-CM) - Malignant neoplasm of cervix, unspecified site ([place])  N94.10 (ICD-10-CM) - Dyspareunia in female      THERAPY DIAG:  Muscle weakness (generalized)   Pelvic pain   Stress incontinence of urine   Aftercare following surgery for neoplasm   Rationale for Evaluation and Treatment Rehabilitation   ONSET DATE: 07/07/2021   SUBJECTIVE:  SUBJECTIVE STATEMENT:  When I was sick I was sneezing bad and leaked urine a lot.  PAIN:  Are you having pain? Yes NPRS scale: 8/10 Pain location: Vaginal   Pain type: aching, burning, sharp, and tight Pain description: intermittent    Aggravating factors: penile penetration; using the larger dilator, vaginal exam Relieving factors: no vaginal penetration   PRECAUTIONS: Other: cancer   WEIGHT BEARING RESTRICTIONS No   FALLS:  Has patient fallen in last 6 months? No   LIVING ENVIRONMENT: Lives with: lives with their partner   OCCUPATION: part time   PLOF: Independent   PATIENT GOALS reduce pain with vaginal penetration   PERTINENT HISTORY:  Cervical cancer with radiation; diabetes, c-section     BOWEL MOVEMENT Pain with bowel movement: No   URINATION Pain with urination: No Fully empty bladder: Yes:   Stream:  average Urgency: Yes: some watches what she drinks when she goes for a car ride Frequency: can wait 3 hours Leakage: Coughing and Sneezing Pads: Yes: when in a car for a distance   INTERCOURSE Pain with intercourse: During Penetration, After Intercourse, and after gets a soreness Ability to have vaginal penetration:  Yes: not always comfortable to have the penis deeper Climax: not since her cancer Marinoff Scale: 2/3   PREGNANCY Vaginal deliveries 3 Tearing No C-section deliveries 1 Currently pregnant No      OBJECTIVE: (objective measures completed at initial evaluation unless otherwise dated)   (Copy Eval's Objective through Plan section here)  DIAGNOSTIC FINDINGS:  none   PATIENT SURVEYS:  12/02/2021 PFIQ-7 48; UIQ-7 38, POPIQ-7 10   COGNITION:            Overall cognitive status: Within functional limits for tasks assessed                          SENSATION:            Light touch: Appears intact            Proprioception: Appears intact   MUSCLE LENGTH: Hamstrings: Right decreased  deg; Left decreased deg Thomas test:  Right positive deg; Left positive deg                  POSTURE: rounded shoulders, forward head, decreased lumbar lordosis, and increased abdominal size               PELVIC ALIGNMENT: correct alignment   LUMBARAROM/PROM   A/PROM A/PROM  eval 11/27/2021 12/02/2021 12/18/2021  Flexion full full full full  Extension Decreased by 50% Decreased by 25% Decreased by 25% Decreased by 25%  Right lateral flexion Decreased by 25% full full full  Left lateral flexion Decreased by 25% full full full  Right rotation Decreased by 25% full full full  Left rotation Decreased by 25% full full full   (Blank rows = not tested)   LOWER EXTREMITY ROM:   Active ROM Right eval Left eval Right 12/02/2021 Left 12/02/2021  Hip extension 0 0 5 5   (Blank rows = not tested)   LOWER EXTREMITY MMT:   MMT Right eval Left eval Right 12/02/2021 Left 12/02/2021  Hip extension 3+/5 3+/5 4/5 4/5  Hip abduction 3+/5 4/5 4+/5 5/5     PALPATION:   General  tenderness suprapubically, bilaterally hip adductor by the groin,                  External Perineal Exam along the perineal body, ishiocavernosis,  bulbocavernosus                             Internal Pelvic Floor along the introitus, levator ani, obturator internist, side of the bladder and urethra, and at the vaginal vault. Therapist touched the vaginal vault with her index finger that Is 4 inches long   Patient confirms identification and approves PT to assess internal pelvic floor and treatment Yes   PELVIC MMT:   MMT eval 10/21/2021 11/20/2021 12/09/2021 12/18/2021 12/30/2021  Vaginal 2/5 but not hug of therapist finger 3/5 with weak lift and hug of therapist finger 3/5  with circular weak hug 3/5 with weak hug 3/5 with stronger hug of therapist finger 3/5  (Blank rows = not tested)         TONE: increased   PROLAPSE: None noted   TODAY'S TREATMENT  01/19/2022 Manual: Soft tissue mobilization: Scar tissue mobilization: Myofascial  release: Spinal mobilization: Internal pelvic floor techniques:No emotional/communication barriers or cognitive limitation. Patient is motivated to learn. Patient understands and agrees with treatment goals and plan. PT explains patient will be examined in standing, sitting, and lying down to see how their muscles and joints work. When they are ready, they will be asked to remove their underwear so PT can examine their perineum. The patient is also given the option of providing their own chaperone as one is not provided in our facility. The patient also has the right and is explained the right to defer or refuse any part of the evaluation or treatment including the internal exam. With the patient's consent, PT will use one gloved finger to gently assess the muscles of the pelvic floor, seeing how well it contracts and relaxes and if there is muscle symmetry. After, the patient will get dressed and PT and patient will discuss exam findings and plan of care. PT and patient discuss plan of care, schedule, attendance policy and HEP activities.  Going through vaginally and working on the levator ani, along the urethra sphincter, and the top of the vaginal vault.  Exercises: Stretches/mobility:  piriformis bil. In sitting holding 30 sec Hamstring in sitting holding 30 sec bil.  Standing hi padductor stretch holding 30 sec bil.  Strengthening: nustep for 9 minutes level 4     12/30/2021 Manual: Internal pelvic floor techniques:No emotional/communication barriers or cognitive limitation. Patient is motivated to learn. Patient understands and agrees with treatment goals and plan. PT explains patient will be examined in standing, sitting, and lying down to see how their muscles and joints work. When they are ready, they will be asked to remove their underwear so PT can examine their perineum. The patient is also given the option of providing their own chaperone as one is not provided in our facility. The patient  also has the right and is explained the right to defer or refuse any part of the evaluation or treatment including the internal exam. With the patient's consent, PT will use one gloved finger to gently assess the muscles of the pelvic floor, seeing how well it contracts and relaxes and if there is muscle symmetry. After, the patient will get dressed and PT and patient will discuss exam findings and plan of care. PT and patient discuss plan of care, schedule, attendance policy and HEP activities.  Going through the vaginal canal working on the cervix internally and the other hand works on the cervix and uterus externally on the lower abdomen In right sidely  working on the anterior wall of the vaginal canal and posterior wall Using 2 fingers internally working on expanding the vaginal canal to increase the diameter of the vaginal canal and release the restrictions     12/18/2021 Manual: Internal pelvic floor techniques:No emotional/communication barriers or cognitive limitation. Patient is motivated to learn. Patient understands and agrees with treatment goals and plan. PT explains patient will be examined in standing, sitting, and lying down to see how their muscles and joints work. When they are ready, they will be asked to remove their underwear so PT can examine their perineum. The patient is also given the option of providing their own chaperone as one is not provided in our facility. The patient also has the right and is explained the right to defer or refuse any part of the evaluation or treatment including the internal exam. With the patient's consent, PT will use one gloved finger to gently assess the muscles of the pelvic floor, seeing how well it contracts and relaxes and if there is muscle symmetry. After, the patient will get dressed and PT and patient will discuss exam findings and plan of care. PT and patient discuss plan of care, schedule, attendance policy and HEP activities.  Going through  the vaginal canal working on the top  of the vaginal canal and the anterior and posterior portion using fascial release and stretching the tissue to elongate the tissue. Working on the tissue above the bladder to soften. The tissue and change the restrictions from radiation. Therapist was able to place her index finger fully into the vaginal canal for 6 inches.  Patient was able to cough with pelvic floor contraction and no urinary leakage was seen.            PATIENT EDUCATION: 10/21/2021 Education details: Access Code: [email or web address] Person educated: Patient Education method: Explanation, Demonstration, Tactile cues, Verbal cues, and Handouts Education comprehension: verbalized understanding, returned demonstration, verbal cues required, tactile cues required, and needs further education     HOME EXERCISE PROGRAM: 10/21/2021  Access Code: [email or web address], talked about the ohnut and handle for the vaginal dilator URL: [email or web address] Date: 10/21/2021 Prepared by: Earlie Counts   Program Notes use the estrace cream  into the vaginal canal 2 times per week. place it in the canal with the applicator then use your finger to rub it in and the extra rub into the vulvar are. Use the good clean love daily on the vulva area.    Exercises - Hooklying Hamstring Stretch with Strap  - 1 x daily - 7 x weekly - 1 sets - 2 reps - 30 sec hold - Supine ITB Stretch with Strap  - 1 x daily - 7 x weekly - 1 sets - 2 reps - 30 sec hold - Supine Piriformis Stretch Pulling Heel to Hip  - 1 x daily - 7 x weekly - 1 sets - 2 reps - 30 sec hold - Prone Press Up  - 1 x daily - 7 x weekly - 1 sets - 10 reps - Prone Hip Extension with Pillow Under Abdomen  - 1 x daily - 3 x weekly - 2 sets - 10 reps ASSESSMENT:   CLINICAL IMPRESSION: Patient is a 46 y.o. female who was seen today for physical therapy  treatment for Dyspareunia.  Patient is having less pinching pain and sharp pain when using the vaginal  dilator  Patient will benefit from skilled therapy to reduce pelvic pain, improve tissue extensibility for the radiation and  reduce urinary leakage.      OBJECTIVE IMPAIRMENTS decreased activity tolerance, decreased coordination, decreased endurance, decreased strength, increased fascial restrictions, increased muscle spasms, impaired flexibility, and pain.    ACTIVITY LIMITATIONS bending, standing, continence, toileting, and vaginal exam   PARTICIPATION LIMITATIONS: interpersonal relationship, shopping, and community activity   PERSONAL FACTORS Cervical cancer with radiation; diabetes, c-section are also affecting patient's functional outcome.    REHAB POTENTIAL: Good   CLINICAL DECISION MAKING: Evolving/moderate complexity   EVALUATION COMPLEXITY: Moderate     GOALS: Goals reviewed with patient? Yes   SHORT TERM GOALS: Target date: 11/04/2021   Patient understand how to use moisturizers to the vaginal canal to improve the quality of the tissue.  Baseline:not educated yet Goal status: met 10/14/2021    2.  Patient educated on Ohnut to use with her dilator to make it easier to insert and relax with it in the canal.  Baseline: Not educated yet Goal status: Met 11/27/2021   3.  Instructed on how to use the handle for the dilator to place it in the vaginal canal.  Baseline: not educated yet Goal status: Met 11/27/2021   LONG TERM GOALS: Target date: 12/30/2021    Patient is independent with her advanced HEP for pelvic floor stretches, hip strength and pelvic floor coordination.  Baseline: Not educated yet Goal status: Ongoing 12/02/2021   2.  Patient able to have vaginal penetration with pain level </= 2-3/10 due to improved mobility of the tissue and accommodations using a device or different positions.  Baseline: pain level 8/10 Goal status: ongoing   3.  Patient able to use the largest dilator with pain </= 1-2/10 to continue to remodel the fibrotic tissue of the vaginal wall.   Baseline: pain level 6/10 when using the last 2 dilators at the end of the vaginal canal.  Goal status: Ongoing 12/02/2021   4.  Urinary leakage decreased >/= 70% due to the ability to fully relax and fully contract the pelvic floor and strength >/= 3/5 Baseline: urinary leakage with coughing and sneezing and strength is 3/5 without hug of therapist finger.  Goal status: Met 12/02/2021     PLAN: PT FREQUENCY: 1x/week   PT DURATION: 4 weeks   PLANNED INTERVENTIONS: Therapeutic exercises, Therapeutic activity, Neuromuscular re-education, Patient/Family education, Self Care, Joint mobilization, Dry Needling, Electrical stimulation, Spinal mobilization, Taping, Biofeedback, and Manual therapy   PLAN FOR NEXT SESSION: manual work on the anterior wall, work on pelvic floor contraction;  internal work on the right side,  Earlie Counts, PT 01/19/22 9:26 AM

## 2022-01-20 ENCOUNTER — Encounter (INDEPENDENT_AMBULATORY_CARE_PROVIDER_SITE_OTHER): Payer: Self-pay | Admitting: Family Medicine

## 2022-01-20 ENCOUNTER — Ambulatory Visit (INDEPENDENT_AMBULATORY_CARE_PROVIDER_SITE_OTHER): Payer: Medicaid Other | Admitting: Family Medicine

## 2022-01-20 VITALS — BP 137/58 | HR 77 | Temp 98.1°F | Ht 62.0 in | Wt 220.0 lb

## 2022-01-20 DIAGNOSIS — E785 Hyperlipidemia, unspecified: Secondary | ICD-10-CM

## 2022-01-20 DIAGNOSIS — Z6841 Body Mass Index (BMI) 40.0 and over, adult: Secondary | ICD-10-CM

## 2022-01-20 DIAGNOSIS — E1169 Type 2 diabetes mellitus with other specified complication: Secondary | ICD-10-CM | POA: Diagnosis not present

## 2022-01-20 DIAGNOSIS — E1165 Type 2 diabetes mellitus with hyperglycemia: Secondary | ICD-10-CM

## 2022-01-20 DIAGNOSIS — E669 Obesity, unspecified: Secondary | ICD-10-CM | POA: Diagnosis not present

## 2022-01-20 DIAGNOSIS — Z7985 Long-term (current) use of injectable non-insulin antidiabetic drugs: Secondary | ICD-10-CM

## 2022-01-20 MED ORDER — ATORVASTATIN CALCIUM 10 MG PO TABS: 10.0000 mg | ORAL_TABLET | Freq: Every day | ORAL | 0 refills | Status: DC

## 2022-01-20 MED ORDER — OZEMPIC (0.25 OR 0.5 MG/DOSE) 2 MG/1.5ML ~~LOC~~ SOPN: 0.5000 mg | PEN_INJECTOR | SUBCUTANEOUS | 0 refills | Status: DC

## 2022-01-27 ENCOUNTER — Ambulatory Visit (INDEPENDENT_AMBULATORY_CARE_PROVIDER_SITE_OTHER): Payer: Medicaid Other

## 2022-01-27 VITALS — BP 147/82

## 2022-01-27 DIAGNOSIS — I10 Essential (primary) hypertension: Secondary | ICD-10-CM

## 2022-01-28 ENCOUNTER — Encounter: Payer: Self-pay | Admitting: Physical Therapy

## 2022-01-28 ENCOUNTER — Ambulatory Visit (INDEPENDENT_AMBULATORY_CARE_PROVIDER_SITE_OTHER): Payer: Medicaid Other | Admitting: Primary Care

## 2022-01-28 ENCOUNTER — Ambulatory Visit: Payer: Medicaid Other | Admitting: Physical Therapy

## 2022-01-28 DIAGNOSIS — N393 Stress incontinence (female) (male): Secondary | ICD-10-CM

## 2022-01-28 DIAGNOSIS — M6281 Muscle weakness (generalized): Secondary | ICD-10-CM | POA: Diagnosis not present

## 2022-01-28 DIAGNOSIS — R102 Pelvic and perineal pain: Secondary | ICD-10-CM

## 2022-01-28 NOTE — Therapy (Signed)
OUTPATIENT PHYSICAL THERAPY TREATMENT NOTE   Patient Name: Tamara Osborne MRN: [id number] DOB:Mar 22, 1975, 46 y.o., female Today's Date: 01/28/2022  PCP: Kerin Perna, NP   REFERRING PROVIDER: [name], MD    END OF SESSION:   PT End of Session - 01/28/22 1447     Visit Number 13    Date for PT Re-Evaluation 02/10/22    Authorization Type [employer]    Authorization Time Period 12/05/2021-02/02/2022    Authorization - Visit Number 5    Authorization - Number of Visits 5    PT Start Time 6962    PT Stop Time 9528    PT Time Calculation (min) 40 min    Activity Tolerance Patient tolerated treatment well    Behavior During Therapy WFL for tasks assessed/performed             Past Medical History:  Diagnosis Date   Anemia    Anxiety    Bartholin cyst    Cervical cancer ([place])    Chronic kidney disease    Constipation    Depression    Deviated septum    Diabetes mellitus without complication ([place])    Difficult intravenous access    used port for 05-09-2019 surgery   Fibroids    History of blood transfusion    2 units given 04-26-2019, has had total of 8 units since jan 2021   History of blood transfusion 1 unit given 05-30-19   iv fluids also given   History of kidney problems    History of radiation therapy 03/23/2019-05/04/2019   Cervical external beam   Dr Gery Pray   History of radiation therapy 05/09/2019-06/05/2019   vaginal brachytherapy   Dr Gery Pray   History of recent blood transfusion 05/16/2019   2 units given  per dr Pollyann Savoy at cancer center   Hypertension    Neuropathy    both thumbs   Obesity    Palpitations    PONV (postoperative nausea and vomiting)    Prediabetes    Preeclampsia 2006   Sleep apnea    no cpap used insurance would not cover, osa severe per pt   SOB (shortness of breath)    Swallowing difficulty    Past Surgical History:  Procedure Laterality Date   CESAREAN SECTION WITH BILATERAL TUBAL LIGATION   11/10/2004       DILATION AND CURETTAGE OF UTERUS  1997   IR IMAGING GUIDED PORT INSERTION  04/04/2019   IR REMOVAL TUN ACCESS W/ PORT W/O FL MOD SED  10/27/2021   OPERATIVE ULTRASOUND N/A 05/09/2019   Procedure: OPERATIVE ULTRASOUND;  Surgeon: Gery Pray, MD;  Location: [place];  Service: Urology;  Laterality: N/A;   OPERATIVE ULTRASOUND N/A 05/15/2019   Procedure: OPERATIVE ULTRASOUND;  Surgeon: Gery Pray, MD;  Location: [facility];  Service: Urology;  Laterality: N/A;   OPERATIVE ULTRASOUND N/A 05/22/2019   Procedure: OPERATIVE ULTRASOUND;  Surgeon: Gery Pray, MD;  Location: [facility];  Service: Urology;  Laterality: N/A;   OPERATIVE ULTRASOUND N/A 05/29/2019   Procedure: OPERATIVE ULTRASOUND;  Surgeon: Gery Pray, MD;  Location: [facility];  Service: Urology;  Laterality: N/A;   OPERATIVE ULTRASOUND N/A 06/05/2019   Procedure: OPERATIVE ULTRASOUND;  Surgeon: Gery Pray, MD;  Location: [facility];  Service: Urology;  Laterality: N/A;   TANDEM RING INSERTION N/A 05/09/2019   Procedure: TANDEM RING INSERTION;  Surgeon: Gery Pray, MD;  Location: [place];  Service: Urology;  Laterality: N/A;   TANDEM RING INSERTION N/A 05/15/2019   Procedure: TANDEM RING INSERTION;  Surgeon: Gery Pray, MD;  Location: [facility];  Service: Urology;  Laterality: N/A;   TANDEM RING INSERTION N/A 05/22/2019   Procedure: TANDEM RING INSERTION;  Surgeon: Gery Pray, MD;  Location: [facility];  Service: Urology;  Laterality: N/A;   TANDEM RING INSERTION N/A 05/29/2019   Procedure: TANDEM RING INSERTION;  Surgeon: Gery Pray, MD;  Location: [facility];  Service: Urology;  Laterality: N/A;   TANDEM RING INSERTION N/A 06/05/2019   Procedure: TANDEM RING INSERTION;  Surgeon: Gery Pray, MD;  Location: [facility]  Service: Urology;   Laterality: N/A;   Patient Active Problem List   Diagnosis Date Noted   Essential hypertension 12/22/2021   Dyspareunia in female 10/23/2021   Type 2 diabetes mellitus with hyperglycemia, without long-term current use of insulin ([place]) 07/03/2021   Hyperlipidemia associated with type 2 diabetes mellitus ([place]) 07/03/2021   Type 2 diabetes mellitus without complication, without long-term current use of insulin ([place]) 11/15/2020   Incontinence of urine in female 09/09/2020   Onychomycosis of toenail 07/31/2020   OSA (obstructive sleep apnea) 07/31/2020   Prediabetes 05/27/2020   Sinus congestion 04/16/2020   Port-A-Cath in place 01/22/2020   Other hyperlipidemia 01/10/2020   Vitamin D deficiency 01/10/2020   Insulin resistance 01/10/2020   Class 2 severe obesity with serious comorbidity and body mass index (BMI) of 39.0 to 39.9 in adult ([place]) 01/10/2020   Obesity, Class II, BMI 35-39.9 09/07/2019   Dehydration 05/30/2019   Peripheral neuropathy due to chemotherapy ([place]) 04/25/2019   Nausea without vomiting 04/18/2019   Diarrhea 04/04/2019   CKD (chronic kidney disease), symptom management only, stage 3 (moderate) ([place]) 03/30/2019   Generalized anxiety disorder 03/30/2019   Hypertension associated with type 2 diabetes mellitus ([place])    Anxiety 03/23/2019   Squamous cell carcinoma of cervix ([place]) 03/23/2019   Deficiency anemia 03/22/2019   REFERRING DIAG:  C53.9 (ICD-10-CM) - Malignant neoplasm of cervix, unspecified site ([place])  N94.10 (ICD-10-CM) - Dyspareunia in female      THERAPY DIAG:  Muscle weakness (generalized)   Pelvic pain   Stress incontinence of urine   Aftercare following surgery for neoplasm   Rationale for Evaluation and Treatment Rehabilitation   ONSET DATE: 07/07/2021   SUBJECTIVE:  SUBJECTIVE STATEMENT:      PAIN:  Are you having pain? Yes NPRS scale: 8/10 Pain location: Vaginal   Pain type: aching, burning, sharp, and tight Pain description: intermittent    Aggravating factors: penile penetration; using the larger dilator, vaginal exam Relieving factors: no vaginal penetration   PRECAUTIONS: Other: cancer   WEIGHT BEARING RESTRICTIONS No   FALLS:  Has patient fallen in last 6 months? No   LIVING ENVIRONMENT: Lives with: lives with their partner   OCCUPATION: part time   PLOF: Independent   PATIENT GOALS reduce pain with vaginal penetration   PERTINENT HISTORY:  Cervical cancer with radiation; diabetes, c-section     BOWEL MOVEMENT Pain with bowel movement: No   URINATION Pain with urination: No Fully empty bladder: Yes:   Stream:  average Urgency: Yes: some watches what she drinks when she goes for a car ride Frequency: can wait 3 hours Leakage: Coughing and Sneezing Pads: Yes: when in a car for a distance   INTERCOURSE Pain with intercourse: During Penetration, After Intercourse, and after gets a soreness Ability to have vaginal penetration:  Yes: not always comfortable to have the penis deeper Climax: not since her cancer Marinoff Scale: 2/3   PREGNANCY Vaginal deliveries 3 Tearing No C-section deliveries 1 Currently pregnant No      OBJECTIVE: (objective measures completed at initial evaluation unless otherwise dated)     (Copy Eval's Objective through Plan section here)   DIAGNOSTIC FINDINGS:  none   PATIENT SURVEYS:  12/02/2021 PFIQ-7 48; UIQ-7 38, POPIQ-7 10   COGNITION:            Overall cognitive status: Within functional limits for tasks assessed                          SENSATION:            Light touch: Appears intact            Proprioception: Appears intact   MUSCLE LENGTH: Hamstrings: Right decreased  deg; Left decreased deg Thomas test: Right positive deg; Left positive deg                   POSTURE: rounded shoulders, forward head, decreased lumbar lordosis, and increased abdominal size               PELVIC ALIGNMENT: correct alignment   LUMBARAROM/PROM   A/PROM A/PROM  eval 11/27/2021 12/02/2021 12/18/2021  Flexion full full full full  Extension Decreased by 50% Decreased by 25% Decreased by 25% Decreased by 25%  Right lateral flexion Decreased by 25% full full full  Left lateral flexion Decreased by 25% full full full  Right rotation Decreased by 25% full full full  Left rotation Decreased by 25% full full full   (Blank rows = not tested)   LOWER EXTREMITY ROM:   Active ROM Right eval Left eval Right 12/02/2021 Left 12/02/2021  Hip extension 0 0 5 5   (Blank rows = not tested)   LOWER EXTREMITY MMT:   MMT Right eval Left eval Right 12/02/2021 Left 12/02/2021  Hip extension 3+/5 3+/5 4/5 4/5  Hip abduction 3+/5 4/5 4+/5 5/5     PALPATION:   General  no tenderness suprapubically, bilaterally hip adductor by the groin,                 Patient confirms identification and approves PT to assess internal pelvic floor and treatment Yes  PELVIC MMT:   MMT eval 10/21/2021 11/20/2021 12/09/2021 12/18/2021 12/30/2021 01/28/2022  Vaginal 2/5 but not hug of therapist finger 3/5 with weak lift and hug of therapist finger 3/5  with circular weak hug 3/5 with weak hug 3/5 with stronger hug of therapist finger 3/5 3/5  (Blank rows = not tested)         TONE: increased   PROLAPSE: None noted   TODAY'S TREATMENT  01/28/2022 Manual: Internal pelvic floor techniques:No emotional/communication barriers or cognitive limitation. Patient is motivated to learn. Patient understands and agrees with treatment goals and plan. PT explains patient will be examined in standing, sitting, and lying down to see how their muscles and joints work. When they are ready, they will be asked to remove their underwear so PT can examine their perineum. The patient is also given the option of  providing their own chaperone as one is not provided in our facility. The patient also has the right and is explained the right to defer or refuse any part of the evaluation or treatment including the internal exam. With the patient's consent, PT will use one gloved finger to gently assess the muscles of the pelvic floor, seeing how well it contracts and relaxes and if there is muscle symmetry. After, the patient will get dressed and PT and patient will discuss exam findings and plan of care. PT and patient discuss plan of care, schedule, attendance policy and HEP activities.  Going in vaginally and working on the levator ani, along the urethra sphincter, and the top of the vaginal vault.  Exercises: Strengthening: Nustep level 5 for 15 minutes while assessing patient    01/19/2022 Manual: Internal pelvic floor techniques:No emotional/communication barriers or cognitive limitation. Patient is motivated to learn. Patient understands and agrees with treatment goals and plan. PT explains patient will be examined in standing, sitting, and lying down to see how their muscles and joints work. When they are ready, they will be asked to remove their underwear so PT can examine their perineum. The patient is also given the option of providing their own chaperone as one is not provided in our facility. The patient also has the right and is explained the right to defer or refuse any part of the evaluation or treatment including the internal exam. With the patient's consent, PT will use one gloved finger to gently assess the muscles of the pelvic floor, seeing how well it contracts and relaxes and if there is muscle symmetry. After, the patient will get dressed and PT and patient will discuss exam findings and plan of care. PT and patient discuss plan of care, schedule, attendance policy and HEP activities.  Going through vaginally and working on the levator ani, along the urethra sphincter, and the top of the vaginal  vault.  Exercises: Stretches/mobility:  piriformis bil. In sitting holding 30 sec Hamstring in sitting holding 30 sec bil.  Standing hi padductor stretch holding 30 sec bil.  Strengthening: nustep for 9 minutes level 4      12/30/2021 Manual: Internal pelvic floor techniques:No emotional/communication barriers or cognitive limitation. Patient is motivated to learn. Patient understands and agrees with treatment goals and plan. PT explains patient will be examined in standing, sitting, and lying down to see how their muscles and joints work. When they are ready, they will be asked to remove their underwear so PT can examine their perineum. The patient is also given the option of providing their own chaperone as one is not provided in our  facility. The patient also has the right and is explained the right to defer or refuse any part of the evaluation or treatment including the internal exam. With the patient's consent, PT will use one gloved finger to gently assess the muscles of the pelvic floor, seeing how well it contracts and relaxes and if there is muscle symmetry. After, the patient will get dressed and PT and patient will discuss exam findings and plan of care. PT and patient discuss plan of care, schedule, attendance policy and HEP activities.  Going through the vaginal canal working on the cervix internally and the other hand works on the cervix and uterus externally on the lower abdomen In right sidely working on the anterior wall of the vaginal canal and posterior wall Using 2 fingers internally working on expanding the vaginal canal to increase the diameter of the vaginal canal and release the restrictions          PATIENT EDUCATION: 10/21/2021 Education details: Access Code: [email or web address] Person educated: Patient Education method: Explanation, Demonstration, Tactile cues, Verbal cues, and Handouts Education comprehension: verbalized understanding, returned demonstration, verbal cues  required, tactile cues required, and needs further education     HOME EXERCISE PROGRAM: 10/21/2021  Access Code: [email or web address], talked about the ohnut and handle for the vaginal dilator URL: [email or web address] Date: 10/21/2021 Prepared by: Earlie Counts   Program Notes use the estrace cream  into the vaginal canal 2 times per week. place it in the canal with the applicator then use your finger to rub it in and the extra rub into the vulvar are. Use the good clean love daily on the vulva area.    Exercises - Hooklying Hamstring Stretch with Strap  - 1 x daily - 7 x weekly - 1 sets - 2 reps - 30 sec hold - Supine ITB Stretch with Strap  - 1 x daily - 7 x weekly - 1 sets - 2 reps - 30 sec hold - Supine Piriformis Stretch Pulling Heel to Hip  - 1 x daily - 7 x weekly - 1 sets - 2 reps - 30 sec hold - Prone Press Up  - 1 x daily - 7 x weekly - 1 sets - 10 reps - Prone Hip Extension with Pillow Under Abdomen  - 1 x daily - 3 x weekly - 2 sets - 10 reps ASSESSMENT:   CLINICAL IMPRESSION: Patient is a 46 y.o. female who was seen today for physical therapy  treatment for Dyspareunia.  Patient is having less pinching pain and sharp pain when using the vaginal dilator  Patient has not had urinary leakage since last visit. She was able to  have penile penetration vaginally when she is on top without pain but if she is in the [place] style there is pain. Patient is using the dilators weekly and on the largest. Patient has improved tissue mobility  Pelvic floor strength is 3/5.      OBJECTIVE IMPAIRMENTS decreased activity tolerance, decreased coordination, decreased endurance, decreased strength, increased fascial restrictions, increased muscle spasms, impaired flexibility, and pain.    ACTIVITY LIMITATIONS bending, standing, continence, toileting, and vaginal exam   PARTICIPATION LIMITATIONS: interpersonal relationship, shopping, and community activity   PERSONAL FACTORS Cervical  cancer with radiation; diabetes, c-section are also affecting patient's functional outcome.    REHAB POTENTIAL: Good   CLINICAL DECISION MAKING: Evolving/moderate complexity   EVALUATION COMPLEXITY: Moderate     GOALS: Goals reviewed with patient? Yes   SHORT TERM GOALS: Target date: 11/04/2021  Patient understand how to use moisturizers to the vaginal canal to improve the quality of the tissue.  Baseline:not educated yet Goal status: met 10/14/2021    2.  Patient educated on Ohnut to use with her dilator to make it easier to insert and relax with it in the canal.  Baseline: Not educated yet Goal status: Met 11/27/2021   3.  Instructed on how to use the handle for the dilator to place it in the vaginal canal.  Baseline: not educated yet Goal status: Met 11/27/2021   LONG TERM GOALS: Target date: 12/30/2021    Patient is independent with her advanced HEP for pelvic floor stretches, hip strength and pelvic floor coordination.  Baseline: Not educated yet Goal status: Met 01/28/2022   2.  Patient able to have vaginal penetration with pain level </= 2-3/10 due to improved mobility of the tissue and accommodations using a device or different positions.  Baseline: pain level 0/10 Goal status: Met 01/28/2022   3.  Patient able to use the largest dilator with pain </= 1-2/10 to continue to remodel the fibrotic tissue of the vaginal wall.  Baseline: pain level 6/10 when using the last 2 dilators at the end of the vaginal canal.  Goal status: Met 01/28/2022   4.  Urinary leakage decreased >/= 70% due to the ability to fully relax and fully contract the pelvic floor and strength >/= 3/5 Baseline: urinary leakage with coughing and sneezing and strength is 3/5 without hug of therapist finger.  Goal status: Met 12/02/2021     PLAN:  PLAN : Discharge to [facility], PT 01/28/22 3:30 PM  PHYSICAL THERAPY DISCHARGE SUMMARY  Visits from Start of Care: 13  Current functional level  related to goals / functional outcomes: See above.    Remaining deficits: See above.    Education / Equipment: HEP   Patient agrees to discharge. Patient goals were met. Patient is being discharged due to meeting the stated rehab goals. Thank you for the referral. Earlie Counts, PT 01/28/22 3:31 PM

## 2022-01-30 ENCOUNTER — Encounter: Payer: Self-pay | Admitting: Physical Therapy

## 2022-02-03 ENCOUNTER — Encounter: Payer: Self-pay | Admitting: Physical Therapy

## 2022-02-03 NOTE — Progress Notes (Signed)
Chief Complaint:   OBESITY Tamara Osborne is here to discuss her progress with her obesity treatment plan along with follow-up of her obesity related diagnoses. Tamara Osborne is on keeping a food journal and adhering to recommended goals of 1300-1500 calories and 85 grams of protein and states she is following her eating plan approximately 50% of the time. Tamara Osborne states she is walking the dog 15-20 minutes 7 times per week.  Today's visit was #: 45 Starting weight: 208 lbs Starting date: 09/20/2019 Today's weight: 220 lbs Today's date: 01/20/2022 Total lbs lost to date: 0 Total lbs lost since last in-office visit: 5  Interim History: Tamara Osborne had COVID a few weeks ago but originally thought it was related to CPAP or even allergies.  She really had a hard time during her illness getting all protein in daily.  She is starting to feel better and is back on track eating wise.  Subjective:   1. Hyperlipidemia associated with type 2 diabetes mellitus ([place]) Tamara Osborne is on Lipitor with no myalgias.  Her last LDL of 158 and Trigly of 130, HDL of 37.  2. Type 2 diabetes mellitus with hyperglycemia, without long-term current use of insulin ([facility]) Tamara Osborne is now on Ozempic  with no GI side effects noted. No skin side effects like on Victoza.  Assessment/Plan:   1. Hyperlipidemia associated with type 2 diabetes mellitus ([facility]) We will refill Lipitor 10 mg daily for 1 month with 0 refills.  -Refill atorvastatin (LIPITOR) 10 MG tablet; Take 1 tablet (10 mg total) by mouth daily.  Dispense: 90 tablet; Refill: 0  2. Type 2 diabetes mellitus with hyperglycemia, without long-term current use of insulin ([facility]) We will refill Ozempic 0.5 mg SubQ once weekly for 33monthwith 0 refills.  -Refill Semaglutide,0.25 or 0.'5MG'$ /DOS, (OZEMPIC, 0.25 OR 0.5 MG/DOSE,) 2 MG/1.5ML SOPN; Inject 0.5 mg into the skin once a week.  Dispense: 3 mL; Refill: 0  3. Obesity, Current BMI is 40.3 [name] currently in the action  stage of change. As such, her goal is to continue with weight loss efforts. She has agreed to keeping a food journal and adhering to recommended goals of 1450-1600 calories and 90 grams of protein daily.   Exercise goals: All adults should avoid inactivity. Some physical activity is better than none, and adults who participate in any amount of physical activity gain some health benefits.  Behavioral modification strategies: increasing lean protein intake, meal planning and cooking strategies, keeping healthy foods in the home, and planning for success.  JNajathas agreed to follow-up with our clinic in 4 weeks. She was informed of the importance of frequent follow-up visits to maximize her success with intensive lifestyle modifications for her multiple health conditions.   Objective:   Blood pressure (!) 137/58, pulse 77, temperature 98.1 F (36.7 C), height '5\' 2"'$  (1.575 m), weight 220 lb (99.8 kg), SpO2 98 %. Body mass index is 40.24 kg/m.  General: Cooperative, alert, well developed, in no acute distress. HEENT: Conjunctivae and lids unremarkable. Cardiovascular: Regular rhythm.  Lungs: Normal work of breathing. Neurologic: No focal deficits.   Lab Results  Component Value Date   CREATININE 1.21 (H) 10/23/2021   BUN 22 (H) 10/23/2021   NA 138 10/23/2021   K 4.0 10/23/2021   CL 106 10/23/2021   CO2 27 10/23/2021   Lab Results  Component Value Date   ALT 33 10/23/2021   AST 18 10/23/2021   ALKPHOS 84 10/23/2021   BILITOT 0.4 10/23/2021  Lab Results  Component Value Date   HGBA1C 5.4 06/05/2021   HGBA1C 5.2 01/13/2021   HGBA1C 7.5 (H) 10/18/2020   HGBA1C 6.0 (H) 05/20/2020   HGBA1C 5.2 10/30/2019   Lab Results  Component Value Date   INSULIN 40.3 (H) 09/20/2019   Lab Results  Component Value Date   TSH 1.910 09/20/2019   Lab Results  Component Value Date   CHOL 221 (H) 06/05/2021   HDL 37 (L) 06/05/2021   LDLCALC 158 (H) 06/05/2021   TRIG 130 06/05/2021    CHOLHDL 6.0 06/05/2021   Lab Results  Component Value Date   VD25OH 66.26 06/05/2021   VD25OH 59.37 10/18/2020   VD25OH 45.60 05/20/2020   Lab Results  Component Value Date   WBC 7.9 10/23/2021   HGB 13.6 10/23/2021   HCT 38.6 10/23/2021   MCV 79.8 (L) 10/23/2021   PLT 278 10/23/2021   Lab Results  Component Value Date   IRON 17 (L) 03/27/2019   TIBC 283 03/27/2019   FERRITIN 32 03/27/2019   Attestation Statements:   Reviewed by clinician on day of visit: allergies, medications, problem list, medical history, surgical history, family history, social history, and previous encounter notes.  I, Elnora Morrison, RMA am acting as transcriptionist for Coralie Common, MD.  I have reviewed the above documentation for accuracy and completeness, and I agree with the above. - Coralie Common, MD

## 2022-02-11 ENCOUNTER — Ambulatory Visit: Payer: Medicaid Other | Admitting: Physical Therapy

## 2022-02-17 ENCOUNTER — Telehealth (INDEPENDENT_AMBULATORY_CARE_PROVIDER_SITE_OTHER): Payer: Medicaid Other | Admitting: Family Medicine

## 2022-02-17 ENCOUNTER — Encounter (INDEPENDENT_AMBULATORY_CARE_PROVIDER_SITE_OTHER): Payer: Self-pay | Admitting: Family Medicine

## 2022-02-17 DIAGNOSIS — R051 Acute cough: Secondary | ICD-10-CM

## 2022-02-17 DIAGNOSIS — E669 Obesity, unspecified: Secondary | ICD-10-CM | POA: Diagnosis not present

## 2022-02-17 DIAGNOSIS — Z6841 Body Mass Index (BMI) 40.0 and over, adult: Secondary | ICD-10-CM

## 2022-02-17 DIAGNOSIS — E1169 Type 2 diabetes mellitus with other specified complication: Secondary | ICD-10-CM

## 2022-02-17 DIAGNOSIS — Z7985 Long-term (current) use of injectable non-insulin antidiabetic drugs: Secondary | ICD-10-CM

## 2022-02-17 MED ORDER — OZEMPIC (0.25 OR 0.5 MG/DOSE) 2 MG/1.5ML ~~LOC~~ SOPN: 0.5000 mg | PEN_INJECTOR | SUBCUTANEOUS | 0 refills | Status: DC

## 2022-02-17 NOTE — Progress Notes (Signed)
TeleHealth Visit:  This visit was completed with telemedicine (audio/video) technology. [name] has verbally consented to this TeleHealth visit. The patient is located at home, the provider is located at home. The participants in this visit include the listed provider and patient. The visit was conducted today via MyChart video.  OBESITY Tamara Osborne is here to discuss her progress with her obesity treatment plan along with follow-up of her obesity related diagnoses.   Today's visit was # 36 Starting weight: 208 lbs Starting date: 09/20/2019 Weight at last in office visit: 220 lbs on 01/20/22 Total weight loss: 0 lbs at last in office visit on 01/20/22. Today's reported weight:  No weight reported.  Nutrition Plan: keeping a food journal and adhering to recommended goals of 1450-1600 calories and 90 grams protein daily  Current exercise: none   Interim History:  This is her first visit with me. She has a lingering cough from [place] and requested virtual visit today.  Initial office visit at [facility] was 09/20/2019 and she was 208 pounds.  She has gained up to 220 since her initial office visit. Not journaling consistently but feels she eats the same things daily. Struggles with remembering to journal.  She tries to avoid sugar. Generally has eggs and fruit at breakfast, sandwich at lunch.  Denies large portions or excessive snacking.  Occasionally has juice at breakfast.  Has not exercised recently due to having part of toenail removed.  Generally swims or walks.  Assessment/Plan:  1. Type II Diabetes HgbA1c is at goal. Last A1c was 5.4 CBGs: not checking unless feeling symptomatic. Episodes of hypoglycemia: no Medication(s): Ozempic 0.50 mg weekly.  Notes less cravings and appetite with Ozempic. Mild occasional nausea the day after injection. Lab Results  Component Value Date   HGBA1C 5.4 06/05/2021   HGBA1C 5.2 01/13/2021   HGBA1C 7.5 (H) 10/18/2020   Lab Results  Component  Value Date   LDLCALC 158 (H) 06/05/2021   CREATININE 1.21 (H) 10/23/2021    Plan: Refill Ozempic 0.5 mg weekly.  2. Acute cough Has had lingering cough since she had COVID 3 weeks ago.  Prescribed guaifenesin every 4 hours by PCP.  Also prescribed fluticasone nasal spray.  Plan: Continue guaifenesin and fluticasone as prescribed. Drink plenty of water with guaifenesin.  3. Obesity: Current BMI 40.3 Amaryah is currently in the action stage of change. As such, her goal is to continue with weight loss efforts.  She has agreed to keeping a food journal and adhering to recommended goals of 1450-1600 calories and 90 grams protein daily.   1.  Discussed importance of consistent journaling for weight loss. 2.  Advise she needs structure for weight loss and can either do category 3 without journaling or journal consistently.  Exercise goals: Resume exercise as tolerated.  Behavioral modification strategies: increasing lean protein intake, decreasing simple carbohydrates, increasing water intake, planning for success, and keeping a strict food journal.  [name] has agreed to follow-up with our clinic in 4 weeks with fasting labs.   No orders of the defined types were placed in this encounter.   Medications Discontinued During This Encounter  Medication Reason   Insulin Pen Needle (BD PEN NEEDLE NANO 2ND GEN) 32G X 4 MM MISC Completed Course   Semaglutide,0.25 or 0.'5MG'$ /DOS, (OZEMPIC, 0.25 OR 0.5 MG/DOSE,) 2 MG/1.5ML SOPN Reorder     Meds ordered this encounter  Medications   Semaglutide,0.25 or 0.'5MG'$ /DOS, (OZEMPIC, 0.25 OR 0.5 MG/DOSE,) 2 MG/1.5ML SOPN    Sig: Inject 0.5 mg  into the skin once a week.    Dispense:  3 mL    Refill:  0    Order Specific Question:   Supervising Provider    Answer:   Dell Ponto [[id number]]      Objective:   VITALS: Per patient if applicable, see vitals. GENERAL: Alert and in no acute distress. CARDIOPULMONARY: No increased WOB. Speaking in clear  sentences.  PSYCH: Pleasant and cooperative. Speech normal rate and rhythm. Affect is appropriate. Insight and judgement are appropriate. Attention is focused, linear, and appropriate.  NEURO: Oriented as arrived to appointment on time with no prompting.   Lab Results  Component Value Date   CREATININE 1.21 (H) 10/23/2021   BUN 22 (H) 10/23/2021   NA 138 10/23/2021   K 4.0 10/23/2021   CL 106 10/23/2021   CO2 27 10/23/2021   Lab Results  Component Value Date   ALT 33 10/23/2021   AST 18 10/23/2021   ALKPHOS 84 10/23/2021   BILITOT 0.4 10/23/2021   Lab Results  Component Value Date   HGBA1C 5.4 06/05/2021   HGBA1C 5.2 01/13/2021   HGBA1C 7.5 (H) 10/18/2020   HGBA1C 6.0 (H) 05/20/2020   HGBA1C 5.2 10/30/2019   Lab Results  Component Value Date   INSULIN 40.3 (H) 09/20/2019   Lab Results  Component Value Date   TSH 1.910 09/20/2019   Lab Results  Component Value Date   CHOL 221 (H) 06/05/2021   HDL 37 (L) 06/05/2021   LDLCALC 158 (H) 06/05/2021   TRIG 130 06/05/2021   CHOLHDL 6.0 06/05/2021   Lab Results  Component Value Date   WBC 7.9 10/23/2021   HGB 13.6 10/23/2021   HCT 38.6 10/23/2021   MCV 79.8 (L) 10/23/2021   PLT 278 10/23/2021   Lab Results  Component Value Date   IRON 17 (L) 03/27/2019   TIBC 283 03/27/2019   FERRITIN 32 03/27/2019   Lab Results  Component Value Date   VD25OH 66.26 06/05/2021   VD25OH 59.37 10/18/2020   VD25OH 45.60 05/20/2020    Attestation Statements:   Reviewed by clinician on day of visit: allergies, medications, problem list, medical history, surgical history, family history, social history, and previous encounter notes.

## 2022-03-19 ENCOUNTER — Emergency Department (HOSPITAL_BASED_OUTPATIENT_CLINIC_OR_DEPARTMENT_OTHER)
Admission: EM | Admit: 2022-03-19 | Discharge: 2022-03-19 | Disposition: A | Discharge status: Home / Self Care | Payer: Medicaid Other | Attending: Emergency Medicine | Admitting: Emergency Medicine

## 2022-03-19 ENCOUNTER — Ambulatory Visit (INDEPENDENT_AMBULATORY_CARE_PROVIDER_SITE_OTHER): Payer: Medicaid Other | Admitting: Family Medicine

## 2022-03-19 ENCOUNTER — Encounter (HOSPITAL_BASED_OUTPATIENT_CLINIC_OR_DEPARTMENT_OTHER): Payer: Self-pay

## 2022-03-19 ENCOUNTER — Encounter (INDEPENDENT_AMBULATORY_CARE_PROVIDER_SITE_OTHER): Payer: Self-pay | Admitting: Family Medicine

## 2022-03-19 ENCOUNTER — Other Ambulatory Visit: Payer: Self-pay

## 2022-03-19 VITALS — BP 138/81 | HR 88 | Temp 98.3°F

## 2022-03-19 DIAGNOSIS — E669 Obesity, unspecified: Secondary | ICD-10-CM

## 2022-03-19 DIAGNOSIS — H5789 Other specified disorders of eye and adnexa: Secondary | ICD-10-CM | POA: Diagnosis not present

## 2022-03-19 DIAGNOSIS — Z6839 Body mass index (BMI) 39.0-39.9, adult: Secondary | ICD-10-CM | POA: Diagnosis not present

## 2022-03-19 DIAGNOSIS — E1165 Type 2 diabetes mellitus with hyperglycemia: Secondary | ICD-10-CM

## 2022-03-19 DIAGNOSIS — H0015 Chalazion left lower eyelid: Secondary | ICD-10-CM | POA: Diagnosis not present

## 2022-03-19 DIAGNOSIS — H538 Other visual disturbances: Secondary | ICD-10-CM | POA: Diagnosis present

## 2022-03-19 DIAGNOSIS — Z7985 Long-term (current) use of injectable non-insulin antidiabetic drugs: Secondary | ICD-10-CM

## 2022-03-19 MED ORDER — OZEMPIC (0.25 OR 0.5 MG/DOSE) 2 MG/1.5ML ~~LOC~~ SOPN: 0.5000 mg | PEN_INJECTOR | SUBCUTANEOUS | 0 refills | Status: DC

## 2022-03-19 NOTE — ED Triage Notes (Signed)
Patient here POV from Home.  Endorses Blurriness to Left Eye that began 1 Week ago. Itchiness as well that has worsened slowly. OTC eyedrops have been effective temporarily.   Assessed by MD today which noted a small growth to her Interior Lower Eyelid today.   No Visual Aids normally.   NAD Noted during Triage. A&Ox4. GCS 15. Ambulatory.

## 2022-03-19 NOTE — Discharge Instructions (Addendum)
Start doing warm compresses like we talked about several times per day.  Can take ibuprofen for any pain.  You may continue using the over-the-counter drops as needed.  Follow-up with your primary care doctor for further evaluation.  Return to the emergency department for any worsening symptoms that you might have.

## 2022-03-19 NOTE — ED Provider Notes (Signed)
[facility] EMERGENCY DEPT Provider Note   CSN: [id number] Arrival date & time: 03/19/22  1208     History Chief Complaint  Patient presents with   Eye Problem    Tamara Osborne is a 47 y.o. female patient who presents to the emergency room today for further evaluation of left eye itchiness and blurred vision.  Patient was at her weight loss doctor's office who sent her here for further evaluation.  Symptoms been present for 5 days.  She noticed some swelling over the left lower lid.  No fever, chills, vision loss, or drainage.   Eye Problem      Home Medications Prior to Admission medications   Medication Sig Start Date End Date Taking? Authorizing Provider  Accu-Chek Softclix Lancets lancets Use to check blood sugar TID. 11/14/20   Charlott Rakes, MD  amLODipine-valsartan (EXFORGE) 10-320 MG tablet Take 1 tablet by mouth daily. 11/05/21   Kerin Perna, NP  atorvastatin (LIPITOR) 10 MG tablet Take 1 tablet (10 mg total) by mouth daily. 01/20/22   Laqueta Linden, MD  Blood Glucose Monitoring Suppl (ACCU-CHEK GUIDE) w/Device KIT Use to check blood sugar TID. 11/14/20   Charlott Rakes, MD  chlorhexidine (PERIDEX) 0.12 % solution 5-10 mLs 2 (two) times a week. 11/20/20   [provider]  chlorthalidone (HYGROTON) 25 MG tablet Take 1 tablet (25 mg total) by mouth daily. 12/30/21   Kerin Perna, NP  conjugated estrogens (PREMARIN) vaginal cream Place 1 Applicatorful vaginally 3 (three) times a week. 10/08/21   [name], MD  fluticasone [facility]) 50 MCG/ACT nasal spray Place 1 spray into both nostrils daily as needed for allergies or rhinitis. 12/30/21   Kerin Perna, NP  glucose blood (ACCU-CHEK GUIDE) test strip Use to check blood sugar TID. 11/14/20   Charlott Rakes, MD  levocetirizine (XYZAL) 5 MG tablet Take 1 tablet (5 mg total) by mouth every evening. 12/30/21   Kerin Perna, NP  Semaglutide,0.25 or 0.'5MG'$ /DOS,  (OZEMPIC, 0.25 OR 0.5 MG/DOSE,) 2 MG/1.5ML SOPN Inject 0.5 mg into the skin once a week. 03/19/22   Laqueta Linden, MD      Allergies    Penicillins, Shellfish allergy, and Sulfa antibiotics    Review of Systems   Review of Systems  All other systems reviewed and are negative.   Physical Exam Updated Vital Signs BP (!) 180/118 (BP Location: Right Arm)   Pulse 100   Temp 97.7 F (36.5 C) (Oral)   Resp 18   Ht '5\' 2"'$  (1.575 m)   Wt 98.4 kg   SpO2 98%   BMI 39.68 kg/m  Physical Exam Vitals and nursing note reviewed.  Constitutional:      Appearance: Normal appearance.  HENT:     Head: Normocephalic and atraumatic.  Eyes:     General: No scleral icterus.       Right eye: No foreign body or discharge.        Left eye: No foreign body or discharge.     Extraocular Movements: Extraocular movements intact.     Conjunctiva/sclera: Conjunctivae normal.     Right eye: Right conjunctiva is not injected. No chemosis, exudate or hemorrhage.    Left eye: Left conjunctiva is not injected. No chemosis, exudate or hemorrhage.    Pupils: Pupils are equal, round, and reactive to light.     Comments: There is evidence of chalazion in the left lower lid.   Pulmonary:     Effort:  Pulmonary effort is normal.  Skin:    General: Skin is warm and dry.     Findings: No rash.  Neurological:     General: No focal deficit present.     Mental Status: She is alert.  Psychiatric:        Mood and Affect: Mood normal.        Behavior: Behavior normal.      ED Results / Procedures / Treatments   Labs (all labs ordered are listed, but only abnormal results are displayed) Labs Reviewed - No data to display  EKG None  Radiology No results found.  Procedures Procedures    Medications Ordered in ED Medications - No data to display  ED Course/ Medical Decision Making/ A&P   {   Click here for ABCD2, HEART and other calculators                          Medical Decision  Making Tamara Osborne is a 48 y.o. female patient who presents to the emergency department today for further evaluation of left eye itchiness and blurred vision.  Patient's vision is normal here.  This appears to be chalazion which is likely the reason for her symptoms. I discussed conservative measures with her including warm compresses.  I will have her follow-up with her primary care doctor in 1 week to assess progress.  Strict return precautions were discussed.  She is safe for discharge.    Final Clinical Impression(s) / ED Diagnoses Final diagnoses:  Chalazion of left lower eyelid    Rx / DC Orders ED Discharge Orders     None         Hendricks Limes, [place] 03/19/22 1407    Fredia Sorrow, MD 03/19/22 1534

## 2022-03-25 NOTE — Progress Notes (Signed)
Chief Complaint:   OBESITY Tamara Osborne is here to discuss her progress with her obesity treatment plan along with follow-up of her obesity related diagnoses. Tamara Osborne is on keeping a food journal and adhering to recommended goals of 1450-1600 calories and 90 gms protein daily and states she is following her eating plan approximately 90% of the time. Tamara Osborne states she is walking on her walking pad for 15-20 minutes 7 times per week.  Today's visit was #: 58 Starting weight: 208 lbs Starting date: 09/20/2019 Today's weight: 217 lbs Today's date: 03/19/22 Total lbs lost to date: 0 Total lbs lost since last in-office visit: -3  Interim History: Since last appointment she has done well with sticking to category 3.  She has done category 3 more than journaling due to avoiding social media.  She has started walking on a walking pad.  Subjective:   1. Type 2 diabetes mellitus with hyperglycemia, without long-term current use of insulin (HCC) Ozempic without GI side effects.  2. Mass of structure of left eye Has mass on inside eyelid. Ophthalmology called but booked until June.  Appearance seems like chalazion.    Assessment/Plan:   1. Type 2 diabetes mellitus with hyperglycemia, without long-term current use of insulin (HCC) Refill: - Semaglutide,0.25 or 0.'5MG'$ /DOS, (OZEMPIC, 0.25 OR 0.5 MG/DOSE,) 2 MG/1.5ML SOPN; Inject 0.5 mg into the skin once a week.  Dispense: 3 mL; Refill: 0  2. Mass of structure of left eye Patient to follow-up with urgent care or ED.  3. Obesity, Current BMI is 39.8 Tamara Osborne is currently in the action stage of change. As such, her goal is to continue with weight loss efforts. She has agreed to the Category 3 Plan.   Exercise goals: All adults should avoid inactivity. Some physical activity is better than none, and adults who participate in any amount of physical activity gain some health benefits.  Behavioral modification strategies: increasing lean protein  intake, meal planning and cooking strategies, keeping healthy foods in the home, and planning for success.  Tamara Osborne has agreed to follow-up with our clinic in 4 weeks. She was informed of the importance of frequent follow-up visits to maximize her success with intensive lifestyle modifications for her multiple health conditions.   Objective:   Blood pressure 138/81, pulse 88, temperature 98.3 F (36.8 C), height (P) '5\' 2"'$  (1.575 m), weight (P) 217 lb (98.4 kg), SpO2 97 %. Body mass index is 39.69 kg/m (pended).  General: Cooperative, alert, well developed, in no acute distress. HEENT: Conjunctivae and lids unremarkable. Cardiovascular: Regular rhythm.  Lungs: Normal work of breathing. Neurologic: No focal deficits.   Lab Results  Component Value Date   CREATININE 1.21 (H) 10/23/2021   BUN 22 (H) 10/23/2021   NA 138 10/23/2021   K 4.0 10/23/2021   CL 106 10/23/2021   CO2 27 10/23/2021   Lab Results  Component Value Date   ALT 33 10/23/2021   AST 18 10/23/2021   ALKPHOS 84 10/23/2021   BILITOT 0.4 10/23/2021   Lab Results  Component Value Date   HGBA1C 5.4 06/05/2021   HGBA1C 5.2 01/13/2021   HGBA1C 7.5 (H) 10/18/2020   HGBA1C 6.0 (H) 05/20/2020   HGBA1C 5.2 10/30/2019   Lab Results  Component Value Date   INSULIN 40.3 (H) 09/20/2019   Lab Results  Component Value Date   TSH 1.910 09/20/2019   Lab Results  Component Value Date   CHOL 221 (H) 06/05/2021   HDL 37 (L) 06/05/2021  LDLCALC 158 (H) 06/05/2021   TRIG 130 06/05/2021   CHOLHDL 6.0 06/05/2021   Lab Results  Component Value Date   VD25OH 66.26 06/05/2021   VD25OH 59.37 10/18/2020   VD25OH 45.60 05/20/2020   Lab Results  Component Value Date   WBC 7.9 10/23/2021   HGB 13.6 10/23/2021   HCT 38.6 10/23/2021   MCV 79.8 (L) 10/23/2021   PLT 278 10/23/2021   Lab Results  Component Value Date   IRON 17 (L) 03/27/2019   TIBC 283 03/27/2019   FERRITIN 32 03/27/2019     Attestation  Statements:   Reviewed by clinician on day of visit: allergies, medications, problem list, medical history, surgical history, family history, social history, and previous encounter notes.  I, Dawn Whitmire, FNP-C, am acting as Location manager for Coralie Common, MD. I have reviewed the above documentation for accuracy and completeness, and I agree with the above. - Coralie Common, MD

## 2022-03-31 ENCOUNTER — Ambulatory Visit
Admission: EM | Admit: 2022-03-31 | Discharge: 2022-03-31 | Disposition: A | Discharge status: Home / Self Care | Payer: Medicaid Other | Attending: Physician Assistant | Admitting: Physician Assistant

## 2022-03-31 ENCOUNTER — Telehealth: Payer: Self-pay | Admitting: Emergency Medicine

## 2022-03-31 DIAGNOSIS — H1033 Unspecified acute conjunctivitis, bilateral: Secondary | ICD-10-CM | POA: Diagnosis not present

## 2022-03-31 MED ORDER — TOBRAMYCIN 0.3 % OP SOLN: 1.0000 [drp] | OPHTHALMIC | 0 refills | Status: AC

## 2022-03-31 NOTE — ED Triage Notes (Signed)
Pt reports redness, discharge, discomfort in left eye x 2-3 months. Reports itching the right eye x 1 week. Was tod by ED she has a stye 2 weeks ago.

## 2022-03-31 NOTE — Discharge Instructions (Addendum)
Try zyrtec or claritine

## 2022-03-31 NOTE — ED Provider Notes (Signed)
[facility]    CSN: [id number] Arrival date & time: 03/31/22  1345      History   Chief Complaint Chief Complaint  Patient presents with   Eye Problem    HPI Tamara Osborne is a 47 y.o. female.   Patient complains of redness and drainage from bilateral eyes.  Patient reports that she recently had a stye on her left lower eyelid but that has drained.  Patient reports she has irritation and drainage from both eyes now.  Patient reports she is diabetic she is past due for a eye exam.  Patient is planning to schedule to see ophthalmology once her primary care doctor has done the referral  The history is provided by the patient.  Eye Problem Location:  Both eyes   Past Medical History:  Diagnosis Date   Anemia    Anxiety    Bartholin cyst    Cervical cancer ([place])    Chronic kidney disease    Constipation    Depression    Deviated septum    Diabetes mellitus without complication ([place])    Difficult intravenous access    used port for 05-09-2019 surgery   Fibroids    History of blood transfusion    2 units given 04-26-2019, has had total of 8 units since jan 2021   History of blood transfusion 1 unit given 05-30-19   iv fluids also given   History of kidney problems    History of radiation therapy 03/23/2019-05/04/2019   Cervical external beam   Dr Gery Pray   History of radiation therapy 05/09/2019-06/05/2019   vaginal brachytherapy   Dr Gery Pray   History of recent blood transfusion 05/16/2019   2 units given  per dr Pollyann Savoy at cancer center   Hypertension    Neuropathy    both thumbs   Obesity    Palpitations    PONV (postoperative nausea and vomiting)    Prediabetes    Preeclampsia 2006   Sleep apnea    no cpap used insurance would not cover, osa severe per pt   SOB (shortness of breath)    Swallowing difficulty     Patient Active Problem List   Diagnosis Date Noted   Essential hypertension 12/22/2021   Dyspareunia in female 10/23/2021    Type 2 diabetes mellitus with hyperglycemia, without long-term current use of insulin ([place]) 07/03/2021   Hyperlipidemia associated with type 2 diabetes mellitus ([place]) 07/03/2021   Type 2 diabetes mellitus without complication, without long-term current use of insulin ([place]) 11/15/2020   Incontinence of urine in female 09/09/2020   Onychomycosis of toenail 07/31/2020   OSA (obstructive sleep apnea) 07/31/2020   Prediabetes 05/27/2020   Sinus congestion 04/16/2020   Port-A-Cath in place 01/22/2020   Other hyperlipidemia 01/10/2020   Vitamin D deficiency 01/10/2020   Insulin resistance 01/10/2020   Class 2 severe obesity with serious comorbidity and body mass index (BMI) of 39.0 to 39.9 in adult ([place]) 01/10/2020   Obesity, Class II, BMI 35-39.9 09/07/2019   Dehydration 05/30/2019   Peripheral neuropathy due to chemotherapy ([place]) 04/25/2019   Nausea without vomiting 04/18/2019   Diarrhea 04/04/2019   CKD (chronic kidney disease), symptom management only, stage 3 (moderate) ([place]) 03/30/2019   Generalized anxiety disorder 03/30/2019   Hypertension associated with type 2 diabetes mellitus ([place])    Anxiety 03/23/2019   Squamous cell carcinoma of cervix ([place]) 03/23/2019   Deficiency anemia 03/22/2019    Past Surgical History:  Procedure Laterality  Date   CESAREAN SECTION WITH BILATERAL TUBAL LIGATION  11/10/2004       DILATION AND CURETTAGE OF UTERUS  1997   IR IMAGING GUIDED PORT INSERTION  04/04/2019   IR REMOVAL TUN ACCESS W/ PORT W/O FL MOD SED  10/27/2021   OPERATIVE ULTRASOUND N/A 05/09/2019   Procedure: OPERATIVE ULTRASOUND;  Surgeon: Gery Pray, MD;  Location: [facility];  Service: Urology;  Laterality: N/A;   OPERATIVE ULTRASOUND N/A 05/15/2019   Procedure: OPERATIVE ULTRASOUND;  Surgeon: Gery Pray, MD;  Location: [facility];  Service: Urology;  Laterality: N/A;   OPERATIVE ULTRASOUND N/A 05/22/2019   Procedure: OPERATIVE ULTRASOUND;  Surgeon:  Gery Pray, MD;  Location: [facility];  Service: Urology;  Laterality: N/A;   OPERATIVE ULTRASOUND N/A 05/29/2019   Procedure: OPERATIVE ULTRASOUND;  Surgeon: Gery Pray, MD;  Location: [facility];  Service: Urology;  Laterality: N/A;   OPERATIVE ULTRASOUND N/A 06/05/2019   Procedure: OPERATIVE ULTRASOUND;  Surgeon: Gery Pray, MD;  Location: [facility];  Service: Urology;  Laterality: N/A;   TANDEM RING INSERTION N/A 05/09/2019   Procedure: TANDEM RING INSERTION;  Surgeon: Gery Pray, MD;  Location: [facility];  Service: Urology;  Laterality: N/A;   TANDEM RING INSERTION N/A 05/15/2019   Procedure: TANDEM RING INSERTION;  Surgeon: Gery Pray, MD;  Location: [facility];  Service: Urology;  Laterality: N/A;   TANDEM RING INSERTION N/A 05/22/2019   Procedure: TANDEM RING INSERTION;  Surgeon: Gery Pray, MD;  Location: [facility];  Service: Urology;  Laterality: N/A;   TANDEM RING INSERTION N/A 05/29/2019   Procedure: TANDEM RING INSERTION;  Surgeon: Gery Pray, MD;  Location: [facility];  Service: Urology;  Laterality: N/A;   TANDEM RING INSERTION N/A 06/05/2019   Procedure: TANDEM RING INSERTION;  Surgeon: Gery Pray, MD;  Location: [facility];  Service: Urology;  Laterality: N/A;    OB History     Gravida  6   Para  4   Term  3   Preterm  1   AB  2   Living  3      SAB  2   IAB      Ectopic      Multiple      Live Births  3            Home Medications    Prior to Admission medications   Medication Sig Start Date End Date Taking? Authorizing Provider  tobramycin (TOBREX) 0.3 % ophthalmic solution Place 1 drop into both eyes every 4 (four) hours for 10 days. 03/31/22 04/10/22 Yes Fransico Meadow, PA-C  Accu-Chek Softclix Lancets lancets Use to check blood sugar TID. 11/14/20   Charlott Rakes, MD  amLODipine-valsartan (EXFORGE)  10-320 MG tablet Take 1 tablet by mouth daily. 11/05/21   Kerin Perna, NP  atorvastatin (LIPITOR) 10 MG tablet Take 1 tablet (10 mg total) by mouth daily. 01/20/22   Laqueta Linden, MD  Blood Glucose Monitoring Suppl (ACCU-CHEK GUIDE) w/Device KIT Use to check blood sugar TID. 11/14/20   Charlott Rakes, MD  chlorhexidine (PERIDEX) 0.12 % solution 5-10 mLs 2 (two) times a week. 11/20/20   [provider]  chlorthalidone (HYGROTON) 25 MG tablet Take 1 tablet (25 mg total) by mouth daily. 12/30/21   Kerin Perna, NP  conjugated estrogens (PREMARIN) vaginal cream Place 1 Applicatorful vaginally 3 (three) times a  week. 10/08/21   [name], MD  fluticasone [facility]) 50 MCG/ACT nasal spray Place 1 spray into both nostrils daily as needed for allergies or rhinitis. 12/30/21   Kerin Perna, NP  glucose blood (ACCU-CHEK GUIDE) test strip Use to check blood sugar TID. 11/14/20   Charlott Rakes, MD  levocetirizine (XYZAL) 5 MG tablet Take 1 tablet (5 mg total) by mouth every evening. 12/30/21   Kerin Perna, NP  Semaglutide,0.25 or 0.'5MG'$ /DOS, (OZEMPIC, 0.25 OR 0.5 MG/DOSE,) 2 MG/1.5ML SOPN Inject 0.5 mg into the skin once a week. 03/19/22   Laqueta Linden, MD    Family History Family History  Problem Relation Age of Onset   Brain cancer Mother        lung cancer to brain   Hypertension Mother    Cancer Mother    Depression Mother    Anxiety disorder Mother    Diabetes Father    Kidney disease Father    Cancer Father        kidney cancer   Heart failure Sister     Social History Social History   Tobacco Use   Smoking status: Never   Smokeless tobacco: Never  Vaping Use   Vaping Use: Never used  Substance Use Topics   Alcohol use: No   Drug use: No     Allergies   Penicillins, Shellfish allergy, and Sulfa antibiotics   Review of Systems Review of Systems  All other systems reviewed and are negative.    Physical Exam Triage  Vital Signs ED Triage Vitals  Enc Vitals Group     BP 03/31/22 1423 (!) 166/95     Pulse Rate 03/31/22 1423 84     Resp 03/31/22 1423 20     Temp 03/31/22 1423 97.9 F (36.6 C)     Temp Source 03/31/22 1423 Oral     SpO2 03/31/22 1423 96 %     Weight --      Height --      Head Circumference --      Peak Flow --      Pain Score 03/31/22 1422 6     Pain Loc --      Pain Edu? --      Excl. in [place]? --    No data found.  Updated Vital Signs BP (!) 166/95 (BP Location: Right Wrist)   Pulse 84   Temp 97.9 F (36.6 C) (Oral)   Resp 20   SpO2 96%   Visual Acuity Right Eye Distance:   Left Eye Distance:   Bilateral Distance:    Right Eye Near:   Left Eye Near:    Bilateral Near:     Physical Exam Vitals and nursing note reviewed.  Constitutional:      Appearance: She is well-developed.  HENT:     Head: Normocephalic.  Eyes:     Comments: Injected bilateral conjunctiva left lower eyelid shows resolving stye this is much improved from images from previous visit  Cardiovascular:     Rate and Rhythm: Normal rate.  Pulmonary:     Effort: Pulmonary effort is normal.  Abdominal:     General: There is no distension.  Musculoskeletal:        General: Normal range of motion.     Cervical back: Normal range of motion.  Skin:    General: Skin is warm.  Neurological:     General: No focal deficit present.     Mental Status: She  is alert and oriented to person, place, and time.  Psychiatric:        Mood and Affect: Mood normal.      UC Treatments / Results  Labs (all labs ordered are listed, but only abnormal results are displayed) Labs Reviewed - No data to display  EKG   Radiology No results found.  Procedures Procedures (including critical care time)  Medications Ordered in UC Medications - No data to display  Initial Impression / Assessment and Plan / UC Course  I have reviewed the triage vital signs and the nursing notes.  Pertinent labs & imaging  results that were available during my care of the patient were reviewed by me and considered in my medical decision making (see chart for details).     MDM: Patient is given a prescription for Tobrex she is advised to stop the eyedrops that she has been using.  Patient is advised to try Allegra Claritin or Zyrtec for allergy symptoms Final Clinical Impressions(s) / UC Diagnoses   Final diagnoses:  Acute bacterial conjunctivitis of both eyes     Discharge Instructions      Try zyrtec or claritine    ED Prescriptions     Medication Sig Dispense Auth. Provider   tobramycin (TOBREX) 0.3 % ophthalmic solution Place 1 drop into both eyes every 4 (four) hours for 10 days. 5 mL Fransico Meadow, [place]      PDMP not reviewed this encounter. An After Visit Summary was printed and given to the patient.    Fransico Meadow, [place] 03/31/22 [id number]

## 2022-03-31 NOTE — Telephone Encounter (Signed)
Copied from [place] [id number]. Topic: Referral - Request for Referral >> Mar 31, 2022  8:55 AM [place] C wrote: Has patient seen PCP for this complaint? Yes.   *If NO, is insurance requiring patient see PCP for this issue before PCP can refer them? Referral for which specialty: Eye Care / Ophthalmology  Preferred provider/office: [place] Reason for referral: left eye concern   [place]

## 2022-04-07 ENCOUNTER — Other Ambulatory Visit (INDEPENDENT_AMBULATORY_CARE_PROVIDER_SITE_OTHER): Payer: Self-pay | Admitting: Primary Care

## 2022-04-07 DIAGNOSIS — H5712 Ocular pain, left eye: Secondary | ICD-10-CM

## 2022-04-08 ENCOUNTER — Ambulatory Visit (INDEPENDENT_AMBULATORY_CARE_PROVIDER_SITE_OTHER): Payer: Medicaid Other | Admitting: Primary Care

## 2022-04-13 ENCOUNTER — Ambulatory Visit (INDEPENDENT_AMBULATORY_CARE_PROVIDER_SITE_OTHER): Payer: Medicaid Other | Admitting: Primary Care

## 2022-04-16 ENCOUNTER — Telehealth (INDEPENDENT_AMBULATORY_CARE_PROVIDER_SITE_OTHER): Payer: Medicaid Other | Admitting: Family Medicine

## 2022-04-16 ENCOUNTER — Encounter (INDEPENDENT_AMBULATORY_CARE_PROVIDER_SITE_OTHER): Payer: Self-pay | Admitting: Family Medicine

## 2022-04-16 DIAGNOSIS — J309 Allergic rhinitis, unspecified: Secondary | ICD-10-CM | POA: Diagnosis not present

## 2022-04-16 DIAGNOSIS — E669 Obesity, unspecified: Secondary | ICD-10-CM

## 2022-04-16 DIAGNOSIS — E1169 Type 2 diabetes mellitus with other specified complication: Secondary | ICD-10-CM

## 2022-04-16 DIAGNOSIS — Z6841 Body Mass Index (BMI) 40.0 and over, adult: Secondary | ICD-10-CM | POA: Diagnosis not present

## 2022-04-16 DIAGNOSIS — Z7985 Long-term (current) use of injectable non-insulin antidiabetic drugs: Secondary | ICD-10-CM

## 2022-04-16 MED ORDER — OZEMPIC (0.25 OR 0.5 MG/DOSE) 2 MG/1.5ML ~~LOC~~ SOPN: 0.5000 mg | PEN_INJECTOR | SUBCUTANEOUS | 0 refills | Status: DC

## 2022-04-16 NOTE — Progress Notes (Signed)
TeleHealth Visit:  This visit was completed with telemedicine (audio/video) technology. Tamara Osborne has verbally consented to this TeleHealth visit. The patient is located at home, the provider is located at home. The participants in this visit include the listed provider and patient. The visit was conducted today via MyChart video.  OBESITY Tamara Osborne is here to discuss her progress with her obesity treatment plan along with follow-up of her obesity related diagnoses.   Today's visit was # 34 Starting weight: 208 lbs Starting date: 09/20/2019 Weight at last in office visit: 217 lbs on 03/19/22 Total weight loss: 0 lbs at last in office visit on 03/19/22. Today's reported weight:  No weight reported.  Nutrition Plan: the Category 3 plan- 95% adherence  Current exercise:  walking pad for 15-30 minutes 5 times per week.  Interim History:  Doing less walking on her walking pad due to neuropathy (due to chemo).  Drinks pomegranate juice rather than OJ because she believes it has less carbs.at breakfast. Feels she is getting in a lot of protein. Has protein at all meals.  Has 3 scrambled eggs at breakfast. Having low carb wrap with deli meat for lunch. Rarely eats out.  Wants to start swimming again when toe is healed.   Feels Ozempic providing good appetite suppression.   She thinks she will have labs done at her PCP on 05/05/22.  Assessment/Plan:  1. Allergic rhinitis  Woke up with runny nose this am. She feels fine other than that. Prescribed Xyzal but not taking. Takes Flonase fairly regularly.  Plan: Take Xyzal daily. Take Flonase daily.  2. Type II Diabetes HgbA1c is at goal. Last A1c was 5.4 on 06/05/21. CBGs: Fasting low 100s Medication(s): Ozempic 0.5 mg weekly. Has mild nausea on day of shot and the day after.  Lab Results  Component Value Date   HGBA1C 5.4 06/05/2021   HGBA1C 5.2 01/13/2021   HGBA1C 7.5 (H) 10/18/2020   Lab Results  Component Value Date    LDLCALC 158 (H) 06/05/2021   CREATININE 1.21 (H) 10/23/2021   No results found for: "GFR"  Plan: Refill Ozempic 0.5 mg weekly.   3. Obesity: Current BMI 40.2 Tamara Osborne is currently in the action stage of change. As such, her goal is to continue with weight loss efforts.  She has agreed to the Category 3 plan.  1. Try diet juice (Ocean Spray).  3. Told her pomegranate juice has more carbs that OJ.  Exercise goals:  as is. Start back swimming when able.   Behavioral modification strategies: increasing lean protein intake, decreasing simple carbohydrates , and decrease liquid calories.  Tamara Osborne has agreed to follow-up with our clinic in 4 weeks.   No orders of the defined types were placed in this encounter.   Medications Discontinued During This Encounter  Medication Reason   Semaglutide,0.25 or 0.5MG/DOS, (OZEMPIC, 0.25 OR 0.5 MG/DOSE,) 2 MG/1.5ML SOPN Reorder     Meds ordered this encounter  Medications   Semaglutide,0.25 or 0.5MG/DOS, (OZEMPIC, 0.25 OR 0.5 MG/DOSE,) 2 MG/1.5ML SOPN    Sig: Inject 0.5 mg into the skin once a week.    Dispense:  3 mL    Refill:  0    Order Specific Question:   Supervising Provider    Answer:   Dell Ponto [[id number]]      Objective:   VITALS: Per patient if applicable, see vitals. GENERAL: Alert and in no acute distress. CARDIOPULMONARY: No increased WOB. Speaking in clear sentences.  PSYCH: Pleasant and cooperative. Speech  normal rate and rhythm. Affect is appropriate. Insight and judgement are appropriate. Attention is focused, linear, and appropriate.  NEURO: Oriented as arrived to appointment on time with no prompting.   Lab Results  Component Value Date   CREATININE 1.21 (H) 10/23/2021   BUN 22 (H) 10/23/2021   NA 138 10/23/2021   K 4.0 10/23/2021   CL 106 10/23/2021   CO2 27 10/23/2021   Lab Results  Component Value Date   ALT 33 10/23/2021   AST 18 10/23/2021   ALKPHOS 84 10/23/2021   BILITOT 0.4 10/23/2021   Lab  Results  Component Value Date   HGBA1C 5.4 06/05/2021   HGBA1C 5.2 01/13/2021   HGBA1C 7.5 (H) 10/18/2020   HGBA1C 6.0 (H) 05/20/2020   HGBA1C 5.2 10/30/2019   Lab Results  Component Value Date   INSULIN 40.3 (H) 09/20/2019   Lab Results  Component Value Date   TSH 1.910 09/20/2019   Lab Results  Component Value Date   CHOL 221 (H) 06/05/2021   HDL 37 (L) 06/05/2021   LDLCALC 158 (H) 06/05/2021   TRIG 130 06/05/2021   CHOLHDL 6.0 06/05/2021   Lab Results  Component Value Date   WBC 7.9 10/23/2021   HGB 13.6 10/23/2021   HCT 38.6 10/23/2021   MCV 79.8 (L) 10/23/2021   PLT 278 10/23/2021   Lab Results  Component Value Date   IRON 17 (L) 03/27/2019   TIBC 283 03/27/2019   FERRITIN 32 03/27/2019   Lab Results  Component Value Date   VD25OH 66.26 06/05/2021   VD25OH 59.37 10/18/2020   VD25OH 45.60 05/20/2020    Attestation Statements:   Reviewed by clinician on day of visit: allergies, medications, problem list, medical history, surgical history, family history, social history, and previous encounter notes.   This was prepared with the assistance of [employer].  Occasional wrong-word or sound-a-like substitutions may have occurred due to the inherent limitations of voice recognition software.

## 2022-04-29 ENCOUNTER — Other Ambulatory Visit (INDEPENDENT_AMBULATORY_CARE_PROVIDER_SITE_OTHER): Payer: Self-pay | Admitting: Family Medicine

## 2022-04-29 DIAGNOSIS — E1169 Type 2 diabetes mellitus with other specified complication: Secondary | ICD-10-CM

## 2022-05-05 ENCOUNTER — Encounter: Payer: Self-pay | Admitting: *Deleted

## 2022-05-05 ENCOUNTER — Encounter (INDEPENDENT_AMBULATORY_CARE_PROVIDER_SITE_OTHER): Payer: Self-pay | Admitting: Primary Care

## 2022-05-05 ENCOUNTER — Ambulatory Visit (INDEPENDENT_AMBULATORY_CARE_PROVIDER_SITE_OTHER): Payer: Medicaid Other | Admitting: Primary Care

## 2022-05-05 VITALS — BP 138/81 | HR 79 | Resp 16 | Ht 61.0 in | Wt 223.4 lb

## 2022-05-05 DIAGNOSIS — G4733 Obstructive sleep apnea (adult) (pediatric): Secondary | ICD-10-CM

## 2022-05-05 DIAGNOSIS — E1169 Type 2 diabetes mellitus with other specified complication: Secondary | ICD-10-CM | POA: Diagnosis not present

## 2022-05-05 DIAGNOSIS — I10 Essential (primary) hypertension: Secondary | ICD-10-CM | POA: Diagnosis not present

## 2022-05-05 DIAGNOSIS — F411 Generalized anxiety disorder: Secondary | ICD-10-CM | POA: Diagnosis not present

## 2022-05-05 DIAGNOSIS — Z1211 Encounter for screening for malignant neoplasm of colon: Secondary | ICD-10-CM

## 2022-05-05 MED ORDER — GABAPENTIN 100 MG PO CAPS: 100.0000 mg | ORAL_CAPSULE | Freq: Three times a day (TID) | ORAL | 3 refills | Status: DC

## 2022-05-07 LAB — MICROALBUMIN / CREATININE URINE RATIO
Creatinine, Urine: 75.7 mg/dL
Microalb/Creat Ratio: 352 mg/g creat — ABNORMAL HIGH (ref 0–29)
Microalbumin, Urine: 266.8 ug/mL

## 2022-05-10 NOTE — Progress Notes (Signed)
Tamara Osborne, is a 47 y.o. female  [id number]  [id number]  DOB - 09-26-1975  Chief Complaint  Patient presents with   Hypertension       Subjective:   Tamara Osborne is a 47 y.o. female here today for a follow up visit. Patient has No headache, No chest pain, No abdominal pain - No Nausea, No new weakness tingling or numbness, No Cough - shortness of breath  No problems updated.  Allergies  Allergen Reactions   Penicillins     Not sure a child allergy   Shellfish Allergy Swelling    Seafood also any kind   Sulfa Antibiotics     Not sure a child allergy    Past Medical History:  Diagnosis Date   Anemia    Anxiety    Bartholin cyst    Cervical cancer ([place])    Chronic kidney disease    Constipation    Depression    Deviated septum    Diabetes mellitus without complication ([place])    Difficult intravenous access    used port for 05-09-2019 surgery   Fibroids    History of blood transfusion    2 units given 04-26-2019, has had total of 8 units since jan 2021   History of blood transfusion 1 unit given 05-30-19   iv fluids also given   History of kidney problems    History of radiation therapy 03/23/2019-05/04/2019   Cervical external beam   Dr Gery Pray   History of radiation therapy 05/09/2019-06/05/2019   vaginal brachytherapy   Dr Gery Pray   History of recent blood transfusion 05/16/2019   2 units given  per dr Pollyann Savoy at cancer center   Hypertension    Neuropathy    both thumbs   Obesity    Palpitations    PONV (postoperative nausea and vomiting)    Prediabetes    Preeclampsia 2006   Sleep apnea    no cpap used insurance would not cover, osa severe per pt   SOB (shortness of breath)    Swallowing difficulty     Current Outpatient Medications on File Prior to Visit  Medication Sig Dispense Refill   Accu-Chek Softclix Lancets lancets Use to check blood sugar TID. 100 each 2   amLODipine-valsartan (EXFORGE) 10-320  MG tablet Take 1 tablet by mouth daily. 90 tablet 1   atorvastatin (LIPITOR) 10 MG tablet Take 1 tablet (10 mg total) by mouth daily. 90 tablet 0   Blood Glucose Monitoring Suppl (ACCU-CHEK GUIDE) w/Device KIT Use to check blood sugar TID. 1 kit 0   chlorhexidine (PERIDEX) 0.12 % solution 5-10 mLs 2 (two) times a week.     chlorthalidone (HYGROTON) 25 MG tablet Take 1 tablet (25 mg total) by mouth daily. 90 tablet 1   conjugated estrogens (PREMARIN) vaginal cream Place 1 Applicatorful vaginally 3 (three) times a week. 42.5 g 1   fluticasone (FLONASE) 50 MCG/ACT nasal spray Place 1 spray into both nostrils daily as needed for allergies or rhinitis. 16 g 3   glucose blood (ACCU-CHEK GUIDE) test strip Use to check blood sugar TID. 100 each 2   levocetirizine (XYZAL) 5 MG tablet Take 1 tablet (5 mg total) by mouth every evening. 90 tablet 1   Semaglutide,0.25 or 0.'5MG'$ /DOS, (OZEMPIC, 0.25 OR 0.5 MG/DOSE,) 2 MG/1.5ML SOPN Inject 0.5 mg into the skin once a week. 3 mL 0   No current facility-administered medications on file prior to visit.    Objective:  Vitals:   05/05/22 1022  BP: 138/81  Pulse: 79  Resp: 16  SpO2: 97%  Weight: 223 lb 6.4 oz (101.3 kg)  Height: '5\' 1"'$  (1.549 m)    Comprehensive ROS Pertinent positive and negative noted in HPI   Exam General appearance : Awake, alert, not in any distress. Speech Clear. Not toxic looking HEENT: Atraumatic and Normocephalic, pupils equally reactive to light and accomodation Neck: Supple, no JVD. No cervical lymphadenopathy.  Chest: Good air entry bilaterally, no added sounds  CVS: S1 S2 regular, no murmurs.  Abdomen: Bowel sounds present, Non tender and not distended with no gaurding, rigidity or rebound. Extremities: B/L Lower Ext shows no edema, both legs are warm to touch Neurology: Awake alert, and oriented X 3, CN II-XII intact, Non focal Skin: No Rash  Data Review Lab Results  Component Value Date   HGBA1C 5.4 06/05/2021    HGBA1C 5.2 01/13/2021   HGBA1C 7.5 (H) 10/18/2020    Assessment & Plan   Tamara Osborne was seen today for hypertension.  Diagnoses and all orders for this visit:  Colon cancer screening -     Ambulatory referral to Gastroenterology  Type 2 diabetes mellitus with other specified complication, without long-term current use of insulin ([place]) -     Microalbumin / creatinine urine ratio -     gabapentin (NEURONTIN) 100 MG capsule; Take 1 capsule (100 mg total) by mouth 3 (three) times daily.  Essential hypertension Take all medication as prescribed. Avoid smoked meats which are high in sodium content. Avoid soda which contains sodium and are high in sugar which increases your risk for diabetes.  Generalized anxiety disorder Refer to [place] [facility] Visit from 05/05/2022 in [facility]  PHQ-9 Total Score 11       OSA (obstructive sleep apnea) -     Ambulatory referral to Pulmonology  Other orders -     Cancel: HIV Antibody (routine testing w rflx) -     Cancel: HCV Ab w Reflex to Quant PCR     Patient have been counseled extensively about nutrition and exercise. Other issues discussed during this visit include: low cholesterol diet, weight control and daily exercise, foot care, annual eye examinations at Ophthalmology, importance of adherence with medications and regular follow-up. We also discussed long term complications of uncontrolled diabetes and hypertension.   No follow-ups on file.  The patient was given clear instructions to go to ER or return to medical center if symptoms don't improve, worsen or new problems develop. The patient verbalized understanding. The patient was told to call to get lab results if they haven't heard anything in the next week.   This note has been created with Surveyor, quantity. Any transcriptional errors are unintentional.   Kerin Perna, NP 05/10/2022, 10:57 PM

## 2022-05-11 ENCOUNTER — Other Ambulatory Visit (INDEPENDENT_AMBULATORY_CARE_PROVIDER_SITE_OTHER): Payer: Self-pay | Admitting: Primary Care

## 2022-05-11 DIAGNOSIS — I10 Essential (primary) hypertension: Secondary | ICD-10-CM

## 2022-05-11 DIAGNOSIS — Z760 Encounter for issue of repeat prescription: Secondary | ICD-10-CM

## 2022-05-11 NOTE — Telephone Encounter (Unsigned)
Copied from [place] [id number]. Topic: General - Other >> May 11, 2022  9:36 AM Everette C wrote: Reason for CRM: Medication Refill - Medication: amLODipine-valsartan (EXFORGE) 10-320 MG tablet [id number]  Has the patient contacted their pharmacy? Yes.   (Agent: If no, request that the patient contact the pharmacy for the refill. If patient does not wish to contact the pharmacy document the reason why and proceed with request.) (Agent: If yes, when and what did the pharmacy advise?)  Preferred Pharmacy (with phone number or street name): [facility] [phone] - [place] [place] [address] Phone: [phone] Fax: [phone] Hours: Not open 24 hours   Has the patient been seen for an appointment in the last year OR does the patient have an upcoming appointment? Yes.    Agent: Please be advised that RX refills may take up to 3 business days. We ask that you follow-up with your pharmacy.

## 2022-05-12 ENCOUNTER — Other Ambulatory Visit (INDEPENDENT_AMBULATORY_CARE_PROVIDER_SITE_OTHER): Payer: Self-pay

## 2022-05-12 DIAGNOSIS — I10 Essential (primary) hypertension: Secondary | ICD-10-CM

## 2022-05-12 DIAGNOSIS — Z760 Encounter for issue of repeat prescription: Secondary | ICD-10-CM

## 2022-05-12 MED ORDER — AMLODIPINE BESYLATE-VALSARTAN 10-320 MG PO TABS: 1.0000 | ORAL_TABLET | Freq: Every day | ORAL | 1 refills | Status: DC

## 2022-05-12 NOTE — Telephone Encounter (Signed)
Requested medication (s) are due for refill today:   Yes  Requested medication (s) are on the active medication list:   Yes  Future visit scheduled:   Yes 08/06/2022   Last ordered: 11/05/2021 #90, 1 refill  Returned because labs are due per protocol.   Requested Prescriptions  Pending Prescriptions Disp Refills   amLODipine-valsartan (EXFORGE) 10-320 MG tablet 90 tablet 1    Sig: Take 1 tablet by mouth daily.     Cardiovascular: CCB + ARB Combos Failed - 05/11/2022  9:41 AM      Failed - K in normal range and within 180 days    Potassium  Date Value Ref Range Status  10/23/2021 4.0 3.5 - 5.1 mmol/L Final         Failed - Cr in normal range and within 180 days    Creatinine  Date Value Ref Range Status  12/12/2020 1.44 (H) 0.44 - 1.00 mg/dL Final   Creatinine, Ser  Date Value Ref Range Status  10/23/2021 1.21 (H) 0.44 - 1.00 mg/dL Final         Failed - Na in normal range and within 180 days    Sodium  Date Value Ref Range Status  10/23/2021 138 135 - 145 mmol/L Final  09/20/2019 140 134 - 144 mmol/L Final         Passed - Patient is not pregnant      Passed - Last BP in normal range    BP Readings from Last 1 Encounters:  05/05/22 138/81         Passed - Valid encounter within last 6 months    Recent Outpatient Visits           1 week ago Colon cancer screening   [place], Michelle P, NP   4 months ago Sinus congestion   [place], Michelle P, NP   6 months ago Class 3 severe obesity with serious comorbidity and body mass index (BMI) of 40.0 to 44.9 in adult, unspecified obesity type ([place]), CURRENT BMI 39.9   [place], Michelle P, NP   1 year ago Medication refill   [place], Michelle P, NP   1 year ago Type 2 diabetes mellitus without complication, without long-term current use of insulin [facility])   [place], Jarome Matin, RPH-CPP       Future Appointments             In 2 months Oletta Lamas, Milford Cage, NP [place]

## 2022-05-13 ENCOUNTER — Encounter (INDEPENDENT_AMBULATORY_CARE_PROVIDER_SITE_OTHER): Payer: Self-pay

## 2022-05-14 ENCOUNTER — Encounter (INDEPENDENT_AMBULATORY_CARE_PROVIDER_SITE_OTHER): Payer: Self-pay | Admitting: Family Medicine

## 2022-05-14 ENCOUNTER — Telehealth (INDEPENDENT_AMBULATORY_CARE_PROVIDER_SITE_OTHER): Payer: Medicaid Other | Admitting: Family Medicine

## 2022-05-14 DIAGNOSIS — N1831 Chronic kidney disease, stage 3a: Secondary | ICD-10-CM

## 2022-05-14 DIAGNOSIS — Z6839 Body mass index (BMI) 39.0-39.9, adult: Secondary | ICD-10-CM

## 2022-05-14 DIAGNOSIS — E669 Obesity, unspecified: Secondary | ICD-10-CM

## 2022-05-14 DIAGNOSIS — E1159 Type 2 diabetes mellitus with other circulatory complications: Secondary | ICD-10-CM | POA: Diagnosis not present

## 2022-05-14 DIAGNOSIS — Z7985 Long-term (current) use of injectable non-insulin antidiabetic drugs: Secondary | ICD-10-CM

## 2022-05-14 DIAGNOSIS — I129 Hypertensive chronic kidney disease with stage 1 through stage 4 chronic kidney disease, or unspecified chronic kidney disease: Secondary | ICD-10-CM

## 2022-05-14 DIAGNOSIS — E1122 Type 2 diabetes mellitus with diabetic chronic kidney disease: Secondary | ICD-10-CM | POA: Diagnosis not present

## 2022-05-14 MED ORDER — OZEMPIC (0.25 OR 0.5 MG/DOSE) 2 MG/1.5ML ~~LOC~~ SOPN: 0.5000 mg | PEN_INJECTOR | SUBCUTANEOUS | 0 refills | Status: DC

## 2022-05-14 NOTE — Progress Notes (Signed)
TeleHealth Visit:  This visit was completed with telemedicine (audio/video) technology. Tamara Osborne has verbally consented to this TeleHealth visit. The patient is located at home, the provider is located at home. The participants in this visit include the listed provider and patient. The visit was conducted today via MyChart video.  OBESITY Tamara Osborne is here to discuss her progress with her obesity treatment plan along with follow-up of her obesity related diagnoses.   Today's visit was # 40 Starting weight: 208 lbs Starting date: 09/20/2019 Weight at last in office visit: 217 lbs on 03/19/22 Total weight loss: 0 lbs at last in office visit on 03/19/22. Today's reported weight: none reported  Nutrition Plan: the Category 3 plan - 95% adherence.  Current exercise:  walking pad for 15-30 minutes 2 times per week Silver Sneakers 3 times per week.   Interim History:  Started Silver Sneakers this week at the [employer]. After tomorrow will have gone 3 times per week. Her body and feet hurt. She did activities she hasn't done in a while. Her goal is to go 3 times/week. She has been having some diet juice but is working toward drinking more water. She switched to diet juice fare our last visit.  She is not skipping meals and is eating all of the prescribed protein.  She reports excellent plan compliance.   Assessment/Plan:  1. Type 2 Diabetes Mellitus with CKD stage IIIa, without long-term current use of insulin HgbA1c is at goal. Last A1c was 5.4 on 06/06/22. Medication(s): Ozempic 0.5 mg SQ weekly Has occasional nausea which has improved.  She is on an ARB.  Lab Results  Component Value Date   HGBA1C 5.4 06/05/2021   HGBA1C 5.2 01/13/2021   HGBA1C 7.5 (H) 10/18/2020   Lab Results  Component Value Date   LDLCALC 158 (H) 06/05/2021   CREATININE 1.21 (H) 10/23/2021   No results found for: "GFR"  Plan: Continue and refill Ozempic 0.5 mg SQ weekly Consider increasing dose to 1  mg if weight loss does not progress. Avoid NSAIDs.   2. Hypertension Hypertension improved.  She says her blood pressure is much better controlled since starting this combination of medications. Medication(s): amlodipine/valsartan 10-320 and chlorthalidone 25 mg daily.   BP Readings from Last 3 Encounters:  05/05/22 138/81  03/31/22 (!) 166/95  03/19/22 (!) 180/118   Lab Results  Component Value Date   CREATININE 1.21 (H) 10/23/2021   CREATININE 1.47 (H) 06/05/2021   CREATININE 1.44 (H) 12/12/2020   No results found for: "GFR"  Plan: Continue all antihypertensives at current dosages.   3. Morbid Obesity: Current BMI 20 Tamara Osborne is currently in the action stage of change. As such, her goal is to continue with weight loss efforts.  She has agreed to the Category 3 plan.  Exercise goals:  as is  Behavioral modification strategies: increasing lean protein intake, decreasing simple carbohydrates , and planning for success.  Tamara Osborne has agreed to follow-up with our clinic in 4 weeks.   No orders of the defined types were placed in this encounter.   Medications Discontinued During This Encounter  Medication Reason   Semaglutide,0.25 or 0.'5MG'$ /DOS, (OZEMPIC, 0.25 OR 0.5 MG/DOSE,) 2 MG/1.5ML SOPN Reorder     Meds ordered this encounter  Medications   Semaglutide,0.25 or 0.'5MG'$ /DOS, (OZEMPIC, 0.25 OR 0.5 MG/DOSE,) 2 MG/1.5ML SOPN    Sig: Inject 0.5 mg into the skin once a week.    Dispense:  3 mL    Refill:  0  Order Specific Question:   Supervising Provider    Answer:   Dell Ponto [id number]      Objective:   VITALS: Per patient if applicable, see vitals. GENERAL: Alert and in no acute distress. CARDIOPULMONARY: No increased WOB. Speaking in clear sentences.  PSYCH: Pleasant and cooperative. Speech normal rate and rhythm. Affect is appropriate. Insight and judgement are appropriate. Attention is focused, linear, and appropriate.  NEURO: Oriented as arrived to  appointment on time with no prompting.   Attestation Statements:   Reviewed by clinician on day of visit: allergies, medications, problem list, medical history, surgical history, family history, social history, and previous encounter notes.   This was prepared with the assistance of Presenter, broadcasting.  Occasional wrong-word or sound-a-like substitutions may have occurred due to the inherent limitations of voice recognition software.

## 2022-05-21 ENCOUNTER — Encounter (HOSPITAL_BASED_OUTPATIENT_CLINIC_OR_DEPARTMENT_OTHER): Payer: Self-pay | Admitting: Pulmonary Disease

## 2022-05-21 ENCOUNTER — Ambulatory Visit (INDEPENDENT_AMBULATORY_CARE_PROVIDER_SITE_OTHER): Payer: Medicaid Other | Admitting: Pulmonary Disease

## 2022-05-21 VITALS — BP 144/90 | HR 85 | Ht 61.0 in | Wt 225.0 lb

## 2022-05-21 DIAGNOSIS — E1159 Type 2 diabetes mellitus with other circulatory complications: Secondary | ICD-10-CM

## 2022-05-21 DIAGNOSIS — I152 Hypertension secondary to endocrine disorders: Secondary | ICD-10-CM | POA: Diagnosis not present

## 2022-05-21 DIAGNOSIS — G4733 Obstructive sleep apnea (adult) (pediatric): Secondary | ICD-10-CM

## 2022-05-21 NOTE — Assessment & Plan Note (Signed)
Blood pressure slight high today.  She took her medication just prior to.  She denies symptoms of blurred vision headache

## 2022-05-21 NOTE — Progress Notes (Signed)
Subjective:    Patient ID: Tamara Osborne, female    DOB: 1975-09-17, 47 y.o.   MRN: [id number]  HPI  Chief Complaint  Patient presents with   sleep consult    Pt is here to get established for cpap. Pt has been having problems with her machine/mask blowing a lot of air up in her sinuses causing her sinus problems, coughing, congestion as well as eye problems.   47 year old woman referred for evaluation of OSA and cough. She is followed by [facility] neurology for OSA. OSA was diagnosed in 2021 and she received a Luna CPAP machine in 2022 due to the COVID pandemic backlog.  She has adjusted very well to this.  She was provided with a Respironics DreamWear fullface mask.  She sleeps 10 to 12 hours every night and wonders if she sleeps too much.  She reports increase in energy and [facility]  She is trying hard to lose weight however has not been successful.  She is on Ozempic. She has several issues: She feels that the air from the mask is blowing into her eyes, she complains of dry eyes and redness in her eyes in fact she had an ED visit on 1/30 he was diagnosed with conjunctivitis.  She saw an ophthalmologist and was given an antibiotic. She also reports cough especially in the mornings -the antibiotic made her cough more productive she reports clear sputum, no wheezing or shortness of breath. Epworth sleepiness score is 18. Bedtime is between 9 and 10 PM, sleep latency is minimal, she sleeps on her side with 2-3 pillows, reports 1-2 nocturnal awakenings and is out of bed by 8 AM feeling rested without dryness of mouth or headaches. There is no history suggestive of cataplexy, sleep paralysis or parasomnias  Blood pressure is better controlled on 3 medications  PMH -cervical cancer status postchemotherapy, complicated by neuropathy Type 2 diabetes Hypertension CKD stage II   Significant tests/ events reviewed  HST/WatchPAT 12/2019 pAHI  38/hour, low saturation 86%-worse with supine sleep-weight 21 6 pounds   Past Medical History:  Diagnosis Date   Anemia    Anxiety    Bartholin cyst    Cervical cancer (HCC)    Chronic kidney disease    Constipation    Depression    Deviated septum    Diabetes mellitus without complication ([place])    Difficult intravenous access    used port for 05-09-2019 surgery   Fibroids    History of blood transfusion    2 units given 04-26-2019, has had total of 8 units since jan 2021   History of blood transfusion 1 unit given 05-30-19   iv fluids also given   History of kidney problems    History of radiation therapy 03/23/2019-05/04/2019   Cervical external beam   Dr Gery Pray   History of radiation therapy 05/09/2019-06/05/2019   vaginal brachytherapy   Dr Gery Pray   History of recent blood transfusion 05/16/2019   2 units given  per dr Pollyann Savoy at cancer center   Hypertension    Neuropathy    both thumbs   Obesity    Palpitations    PONV (postoperative nausea and vomiting)    Prediabetes    Preeclampsia 2006   Sleep apnea    no cpap used insurance would not cover, osa severe per pt   SOB (shortness of breath)    Swallowing difficulty     Past Surgical  History:  Procedure Laterality Date   CESAREAN SECTION WITH BILATERAL TUBAL LIGATION  11/10/2004       DILATION AND CURETTAGE OF UTERUS  1997   IR IMAGING GUIDED PORT INSERTION  04/04/2019   IR REMOVAL TUN ACCESS W/ PORT W/O FL MOD SED  10/27/2021   OPERATIVE ULTRASOUND N/A 05/09/2019   Procedure: OPERATIVE ULTRASOUND;  Surgeon: Gery Pray, MD;  Location: [facility];  Service: Urology;  Laterality: N/A;   OPERATIVE ULTRASOUND N/A 05/15/2019   Procedure: OPERATIVE ULTRASOUND;  Surgeon: Gery Pray, MD;  Location: [facility];  Service: Urology;  Laterality: N/A;   OPERATIVE ULTRASOUND N/A 05/22/2019   Procedure: OPERATIVE ULTRASOUND;  Surgeon: Gery Pray, MD;  Location: [facility];   Service: Urology;  Laterality: N/A;   OPERATIVE ULTRASOUND N/A 05/29/2019   Procedure: OPERATIVE ULTRASOUND;  Surgeon: Gery Pray, MD;  Location: [facility];  Service: Urology;  Laterality: N/A;   OPERATIVE ULTRASOUND N/A 06/05/2019   Procedure: OPERATIVE ULTRASOUND;  Surgeon: Gery Pray, MD;  Location: [facility];  Service: Urology;  Laterality: N/A;   TANDEM RING INSERTION N/A 05/09/2019   Procedure: TANDEM RING INSERTION;  Surgeon: Gery Pray, MD;  Location: [facility];  Service: Urology;  Laterality: N/A;   TANDEM RING INSERTION N/A 05/15/2019   Procedure: TANDEM RING INSERTION;  Surgeon: Gery Pray, MD;  Location: [facility];  Service: Urology;  Laterality: N/A;   TANDEM RING INSERTION N/A 05/22/2019   Procedure: TANDEM RING INSERTION;  Surgeon: Gery Pray, MD;  Location: [facility];  Service: Urology;  Laterality: N/A;   TANDEM RING INSERTION N/A 05/29/2019   Procedure: TANDEM RING INSERTION;  Surgeon: Gery Pray, MD;  [facility];  Service: Urology;  Laterality: N/A;   TANDEM RING INSERTION N/A 06/05/2019   Procedure: TANDEM RING INSERTION;  Surgeon: Gery Pray, MD;  Location: [facility];  Service: Urology;  Laterality: N/A;    Allergies  Allergen Reactions   Penicillins     Not sure a child allergy   Shellfish Allergy Swelling    Seafood also any kind   Sulfa Antibiotics     Not sure a child allergy    Social History   Socioeconomic History   Marital status: Single    Spouse name: Not on file   Number of children: 4   Years of education: Not on file   Highest education level: Not on file  Occupational History   Not on file  Tobacco Use   Smoking status: Never   Smokeless tobacco: Never  Vaping Use   Vaping Use: Never used  Substance and Sexual Activity   Alcohol use: No   Drug use: No   Sexual activity: Not Currently    Birth  control/protection: Surgical  Other Topics Concern   Not on file  Social History Narrative   Lives with her boyfriend   Social Determinants of Health   Financial Resource Strain: Not on file  Food Insecurity: Not on file  Transportation Needs: Not on file  Physical Activity: Not on file  Stress: Not on file  Social Connections: Not on file  Intimate Partner Violence: Not on file    Family History  Problem Relation Age of Onset   Brain cancer Mother        lung cancer to brain   Hypertension Mother    Cancer Mother    Depression Mother  Anxiety disorder Mother    Diabetes Father    Kidney disease Father    Cancer Father        kidney cancer   Heart failure Sister      Review of Systems Constitutional: negative for anorexia, fevers and sweats  Eyes: negative for irritation, redness and visual disturbance  Ears, nose, mouth, throat, and face: negative for earaches, epistaxis, nasal congestion and sore throat  Respiratory: negative for dyspnea on exertion, sputum and wheezing  Cardiovascular: negative for chest pain, dyspnea, lower extremity edema, orthopnea, palpitations and syncope  Gastrointestinal: negative for abdominal pain, constipation, diarrhea, melena, nausea and vomiting  Genitourinary:negative for dysuria, frequency and hematuria  Hematologic/lymphatic: negative for bleeding, easy bruising and lymphadenopathy  Musculoskeletal:negative for arthralgias, muscle weakness and stiff joints  Neurological: negative for coordination problems, gait problems, headaches and weakness  Endocrine: negative for diabetic symptoms including polydipsia, polyuria and weight loss     Objective:   Physical Exam  Gen. Pleasant, obese, in no distress, normal affect ENT - no pallor,icterus, no post nasal drip, class 3 airway Neck: No JVD, no thyromegaly, no carotid bruits Lungs: no use of accessory muscles, no dullness to percussion, decreased without rales or rhonchi   Cardiovascular: Rhythm regular, heart sounds  normal, no murmurs or gallops, no peripheral edema Abdomen: soft and non-tender, no hepatosplenomegaly, BS normal. Musculoskeletal: No deformities, no cyanosis or clubbing Neuro:  alert, non focal, no tremors       Assessment & Plan:

## 2022-05-21 NOTE — Assessment & Plan Note (Signed)
CPAP download was reviewed and this shows excellent control of events on auto CPAP settings 6 to 12 cm with average pressure of 10 cm, excellent compliance more than 10 hours per night with minimal leak. She is very compliant and CPAP is definitely helped improve her daytime somnolence and fatigue. She reports a leak from the [employer]ips DreamWear mask and we will provide her with a small size AirFit F30 fullface mask.  If this is not helpful we can refer her to mask fitting Her morning cough is likely related to humidity.  She has tried allergy medications with some benefit to  Weight loss encouraged, compliance with goal of at least 4-6 hrs every night is the expectation. Advised against medications with sedative side effects Cautioned against driving when sleepy - understanding that sleepiness will vary on a day to day basis

## 2022-05-21 NOTE — Patient Instructions (Signed)
CPAP is working well on current settings  Keep up your weight loss & exercise program  X trial of small airfit F30 full face mask to aeroflow

## 2022-05-21 NOTE — Addendum Note (Signed)
Addended by: Lorretta Harp on: 05/21/2022 10:09 AM   Modules accepted: Orders

## 2022-05-26 ENCOUNTER — Telehealth (INDEPENDENT_AMBULATORY_CARE_PROVIDER_SITE_OTHER): Payer: Self-pay | Admitting: Licensed Clinical Social Worker

## 2022-05-26 ENCOUNTER — Encounter (INDEPENDENT_AMBULATORY_CARE_PROVIDER_SITE_OTHER): Payer: Self-pay

## 2022-05-26 NOTE — Telephone Encounter (Signed)
LCSWA called patient today to introduce herself and to assess patients' mental health needs. Patient did not answer the phone. LCSWA was able to leave a brief message with the patient asking them to return the call. Patient was referred by PCP for GAD.   Counseling Resources   [email or web address]  [facility] [address], [place], [place] [address] [phone] or [phone] Walk-in urgent care 24/7 for anyone  For [facility] ONLY New patient assessments and therapy walk-ins: Monday and Wednesday 8am-11am First and second Friday 1pm-5pm New patient psychiatry and medication management walk-ins: Mondays, Wednesdays, Thursdays, Fridays 8am-11am No psychiatry walk-ins on Tuesday   *Accepts all insurance and uninsured for Urgent Care needs *Accepts Medicaid and uninsured for outpatient treatment   [facility] (Therapy and psychiatry) [facility] (near [place]) [address], [place], [place] [address] [phone] Fax: [phone] ([place])   [place] at [place],  [place]  [address] [phone] Call for appointment  [facility] [place] (Therapy only)  [facility] [place] [address] [place], [place] [address] Monday - Friday: 8:30 a.m.-12 p.m. / 1 p.m.-2:30 p.m.  [facility] [address] [place], [place] [address] Monday-Friday: 8:30 a.m.-12 p.m. / 2-3:30 p.m. (INSURANCE REQUIRED -MEDICAID ACCEPTED) They do offer a sliding fee scale $20-$30/session   [facility] Counseling [place], [place] [address] Phone: [place] [address] [place] [address]  Phone: [phone] (Does not accept Medicaid) (only one provider accepts Medicare)  [facility] [address] (at [place], [place] [address] (Accepts Medicaid and Medicare)  [facility]) [place] [address] [place]  [place], [place] [address]  Phone: [phone]  [address] [place], [place] [address] Phone: [phone] [facility] Medicaid) [facility] (Therapy only)  [address], [place], [place] [address] Phone: [phone]   [facility] [place] (Therapy only)  [place], [place] [address] Phone: [phone] [facility])   [place] [address] [place], [place] [address] Phone: [phone] ([place]) [employer] [phone] [address] [place], [place] [address] Phone: [phone] [facility]) [facility] (Psychiatry only)  [phone] [address], [place], [place] [address] (Accepts Medicaid and Medicare) [place] (Psychiatry and therapy)  [place], [place], [place] [address] [phone] [facility] Medicare) [place] (psychiatry and therapy) [address], [place], [place] [address] [phone]  [facility] at [address], [place], [place], [place] [address] [phone] Accept [address] [place], [place], [place], [place],  and the following types of Medicaid; [place], [place], [place], [place], [place], [employer], [place], [place] Complete, and [place], as well as offering a Manufacturing systems engineer and private payment options. Provides In-Office Appointments, Virtual Appointments, and Phone Consultations Offers medication management for ages 70 years old and up, including,  Medication Management for Suboxone and [place] [phone] [address], [place], [place] [address] ([place] Medicaid and Medicare)         19.  [facility]  (therapy only)  [address] [place],  [address]  [phone] (Accepts medicare) 20. [facility]  (Suboxone and methodone) [phone] [address] [place], [place] [address] To Be Eligible for Opioid Treatment at [facility] you must be at least 46 years of age you have already tried other interventions that were not successful such as opioid detox, inpatient rehab for opioids, or outpatient counseling specifically for opioid dependency your [facility] drug test must be completely free of benzodiazepines (klonopin, xanax, valium, ativan, or other benz) you have reliable transportation to the [facility] clinic in [place] you recognize that counseling is a critical component of [facility]' Opioid Program and you agree to attend all required counseling sessions you are committed to total drug abstinence and will conscientiously strive to remain free of alcohol, marijuana, and other illicit substances while in treatment you desire a peaceful treatment atmosphere in which personal responsibility and respect toward staff and clients is the norm   21. [facility] [place], [place], [place] [address] Offers SAIOP (Substance Abuse Intensive Outpatient Program) [phone] 22. [facility] [address] [place], [place] [address] [phone] (Accepts medicare)  For those who are tech savvy, go on psychology today, type in your local city (i.e. [place]. [place]) and specify your insurance at the top of the screen after you search. (Medicaid if needed). You can also specify whether you are interested in therapy and psychiatry.  [email or web address]

## 2022-06-09 ENCOUNTER — Encounter: Payer: Self-pay | Admitting: Gynecologic Oncology

## 2022-06-10 NOTE — Progress Notes (Unsigned)
Gynecologic Oncology Return Clinic Visit  06/11/22  Reason for Visit: Surveillance visit in the setting of history of cervix cancer   Treatment History: Oncology History  Squamous cell carcinoma of cervix  03/21/2019 Pathology Results   The biopsy is superficial and therefore depth of invasion cannot be  determined.  There is at least carcinoma in-situ.    03/23/2019 Initial Diagnosis   Squamous cell carcinoma of cervix (HCC)   03/23/2019 Pathology Results   CERVIX, 11 O CLOCK, BIOPSY:  -  Squamous cell carcinoma, invasive  -  See comment   03/23/2019 - 06/05/2019 Radiation Therapy   External beam therapy was from 03/23/2019 - 05/04/2019. HDR vaginal brachytherapy was from 05/19/2019 - 06/05/2019.   Site Technique Total Dose (Gy) Dose per Fx (Gy) Completed Fx Beam Energies  Cervix: Cervix HDR-brachy 5.5/5.5 5.5 1/1 Ir-192  Cervix: Cervix HDR-brachy 5.5/5.5 5.5 1/1 Ir-192  Cervix: Cervix_Bst HDR-brachy 5.5/5.5 5.5 1/1 Ir-192  Cervix: Cervix HDR-brachy 5.5/5.5 5.5 1/1 Ir-192  Cervix: Cervix 3D 45/45 1.8 25/25 10X, 15X  Cervix: Cervix HDR-brachy 5.5/5.5 5.5 1/1 Ir-192  Cervix: Cervix_Bst 3D 9/9 1.8 5/5 15X      03/28/2019 PET scan   1. Hypermetabolic cervical mass measures roughly 7.6 by 6.9 by 4.9 cm, compatible with malignancy. No adenopathy or distant metastatic spread identified. 2. Extending cephalad and anterior to the uterine fundus, there is a 450 cubic cm simple fluid density structure without hypermetabolic activity. This may represent a right adnexal cyst (favored), peritoneal inclusion cyst, or (if the patient has a remote history of pancreatitis) a remote pseudocyst. 3. Faint ground density nodularity in both lower lobes which could be from atypical infection or extrinsic allergic alveolitis. Given nodular appearance of some of this ground-glass density, I would recommend follow up chest CT in 3 months time in order to ensure clearance. 4. Moderate cardiomegaly, cause  uncertain. The patient also has advanced for age/gender atherosclerosis. Aortic Atherosclerosis (ICD10-I70.0).     04/04/2019 Procedure   Successful placement of a right internal jugular approach power injectable Port-A-Cath. The catheter is ready for immediate use   04/07/2019 - 05/12/2019 Chemotherapy   The patient had weekly cisplatin for chemotherapy treatment.     09/06/2019 PET scan   1. No evidence residual hypermetabolic cervical tissue. 2. No evidence of metastatic cervical carcinoma. 3. Large benign cystic mass unchanged in the upper pelvis.         Interval History: Patient reports overall doing well.  Denies any abdominal or pelvic pain.  Has very minimal spotting sometimes when she uses her vaginal dilator.  Reports normal bowel and bladder function.  [facility]  Notes that numbness in her feet is slowly improving, especially the back of her foot affecting her balance.  She had to stop the gabapentin because it was making her sleep a lot.  Been unable to have intimate relations given discomfort.  Continues to use her vaginal dilator regularly.  Past Medical/Surgical History: Past Medical History:  Diagnosis Date   Anemia    Anxiety    Bartholin cyst    Cervical cancer    Chronic kidney disease    Constipation    Depression    Deviated septum    Diabetes mellitus without complication    Difficult intravenous access    used port for 05-09-2019 surgery   Fibroids    History of blood transfusion    2 units given 04-26-2019, has  had total of 8 units since jan 2021   History of blood transfusion 1 unit given 05-30-19   iv fluids also given   History of kidney problems    History of radiation therapy 03/23/2019-05/04/2019   Cervical external beam   Dr Antony Blackbird   History of radiation therapy 05/09/2019-06/05/2019   vaginal brachytherapy   Dr Antony Blackbird   History of recent blood transfusion 05/16/2019   2 units given  per dr  Lambert Mody at cancer center   Hypertension    Neuropathy    both thumbs   Obesity    Palpitations    PONV (postoperative nausea and vomiting)    Prediabetes    Preeclampsia 2006   Sleep apnea    no cpap used insurance would not cover, osa severe per pt   SOB (shortness of breath)    Swallowing difficulty     Past Surgical History:  Procedure Laterality Date   CESAREAN SECTION WITH BILATERAL TUBAL LIGATION  11/10/2004       DILATION AND CURETTAGE OF UTERUS  1997   IR IMAGING GUIDED PORT INSERTION  04/04/2019   IR REMOVAL TUN ACCESS W/ PORT W/O FL MOD SED  10/27/2021   OPERATIVE ULTRASOUND N/A 05/09/2019   Procedure: OPERATIVE ULTRASOUND;  Surgeon: Antony Blackbird, MD;  Location: [facility] [place];  Service: Urology;  Laterality: N/A;   OPERATIVE ULTRASOUND N/A 05/15/2019   Procedure: OPERATIVE ULTRASOUND;  Surgeon: Antony Blackbird, MD;  Location: [facility];  Service: Urology;  Laterality: N/A;   OPERATIVE ULTRASOUND N/A 05/22/2019   Procedure: OPERATIVE ULTRASOUND;  Surgeon: Antony Blackbird, MD;  Location: [facility];  Service: Urology;  Laterality: N/A;   OPERATIVE ULTRASOUND N/A 05/29/2019   Procedure: OPERATIVE ULTRASOUND;  Surgeon: Antony Blackbird, MD;  Location: [facility];  Service: Urology;  Laterality: N/A;   OPERATIVE ULTRASOUND N/A 06/05/2019   Procedure: OPERATIVE ULTRASOUND;  Surgeon: Antony Blackbird, MD;  Location: [facility];  Service: Urology;  Laterality: N/A;   TANDEM RING INSERTION N/A 05/09/2019   Procedure: TANDEM RING INSERTION;  Surgeon: Antony Blackbird, MD;  Location: [facility];  Service: Urology;  Laterality: N/A;   TANDEM RING INSERTION N/A 05/15/2019   Procedure: TANDEM RING INSERTION;  Surgeon: Antony Blackbird, MD;  Location: [facility];  Service: Urology;  Laterality: N/A;   TANDEM RING INSERTION N/A 05/22/2019   Procedure: TANDEM RING INSERTION;  Surgeon: Antony Blackbird, MD;   Location: [facility];  Service: Urology;  Laterality: N/A;   TANDEM RING INSERTION N/A 05/29/2019   Procedure: TANDEM RING INSERTION;  Surgeon: Antony Blackbird, MD;  Location: [facility];  Service: Urology;  Laterality: N/A;   TANDEM RING INSERTION N/A 06/05/2019   Procedure: TANDEM RING INSERTION;  Surgeon: Antony Blackbird, MD;  Location: [facility];  Service: Urology;  Laterality: N/A;    Family History  Problem Relation Age of Onset   Brain cancer Mother        lung cancer to brain   Hypertension Mother    Cancer Mother    Depression Mother    Anxiety disorder Mother    Diabetes Father    Kidney disease Father    Cancer Father        kidney cancer   Heart failure Sister     Social History   Socioeconomic History   Marital status: Single    Spouse name: Not on file  Number of children: 4   Years of education: Not on file   Highest education level: Not on file  Occupational History   Not on file  Tobacco Use   Smoking status: Never   Smokeless tobacco: Never  Vaping Use   Vaping Use: Never used  Substance and Sexual Activity   Alcohol use: No   Drug use: No   Sexual activity: Not Currently    Birth control/protection: Surgical  Other Topics Concern   Not on file  Social History Narrative   Lives with her boyfriend   Social Determinants of Health   Financial Resource Strain: Not on file  Food Insecurity: Not on file  Transportation Needs: Not on file  Physical Activity: Not on file  Stress: Not on file  Social Connections: Not on file    Current Medications:  Current Outpatient Medications:    Accu-Chek Softclix Lancets lancets, Use to check blood sugar TID., Disp: 100 each, Rfl: 2   amLODipine-valsartan (EXFORGE) 10-320 MG tablet, Take 1 tablet by mouth daily., Disp: 90 tablet, Rfl: 1   Blood Glucose Monitoring Suppl (ACCU-CHEK GUIDE) w/Device KIT, Use to check blood sugar TID., Disp: 1 kit, Rfl: 0    chlorthalidone (HYGROTON) 25 MG tablet, Take 1 tablet (25 mg total) by mouth daily., Disp: 90 tablet, Rfl: 1   Cholecalciferol 50 MCG (2000 UT) TABS, Take by mouth., Disp: , Rfl:    fluticasone (FLONASE) 50 MCG/ACT nasal spray, Place 1 spray into both nostrils daily as needed for allergies or rhinitis., Disp: 16 g, Rfl: 3   glucose blood (ACCU-CHEK GUIDE) test strip, Use to check blood sugar TID., Disp: 100 each, Rfl: 2   levocetirizine (XYZAL) 5 MG tablet, Take 1 tablet (5 mg total) by mouth every evening., Disp: 90 tablet, Rfl: 1   atorvastatin (LIPITOR) 10 MG tablet, Take 1 tablet (10 mg total) by mouth daily., Disp: 90 tablet, Rfl: 0   Semaglutide, 1 MG/DOSE, 4 MG/3ML SOPN, Inject 1 mg as directed once a week., Disp: 3 mL, Rfl: 0  Review of Systems: + numbness Denies appetite changes, fevers, chills, fatigue, unexplained weight changes. Denies hearing loss, neck lumps or masses, mouth sores, ringing in ears or voice changes. Denies cough or wheezing.  Denies shortness of breath. Denies chest pain or palpitations. Denies leg swelling. Denies abdominal distention, pain, blood in stools, constipation, diarrhea, nausea, vomiting, or early satiety. Denies pain with intercourse, dysuria, frequency, hematuria or incontinence. Denies hot flashes, pelvic pain, vaginal bleeding or vaginal discharge.   Denies joint pain, back pain or muscle pain/cramps. Denies itching, rash, or wounds. Denies dizziness, headaches, or seizures. Denies swollen lymph nodes or glands, denies easy bruising or bleeding. Denies anxiety, depression, confusion, or decreased concentration.  Physical Exam: BP (!) 153/69 (BP Location: Left Arm, Patient Position: Sitting)   Pulse 87   Temp 98.4 F (36.9 C) (Oral)   Wt 227 lb (103 kg)   SpO2 98%   BMI 42.89 kg/m  General: Alert, oriented, no acute distress. HEENT: Normocephalic, atraumatic, sclera anicteric. Chest: Clear to auscultation bilaterally.  No wheezes or  rhonchi.  Keloid scar over prior port incision. Cardiovascular: Regular rate and rhythm, no murmurs. Abdomen: Obese, soft, nontender.  Normoactive bowel sounds.  No masses or hepatosplenomegaly appreciated.  Extremities: Grossly normal range of motion.  Warm, well perfused.  No edema bilaterally. Lymphatics: No cervical, supraclavicular, or inguinal adenopathy. GU: Normal appearing external genitalia without erythema, excoriation, or lesions.  Speculum exam reveals moderately atrophic vaginal  mucosa, no lesions or masses, no bleeding or discharge.  The cervix itself is flush with the vaginal apex, somewhat posteriorly deviated.  No atypical vascularity.  Bimanual exam reveals no nodularity or masses. Rectovaginal exam confirms these findings.  Laboratory & Radiologic Studies: None new  Assessment & Plan: Tamara Osborne is a 47 y.o. woman with Stage IIB SCC who presents for surveillance.  Completed adjuvant chemoradiation in April 2021.   Patient remains NED.  Pap test and HPV testing was performed today.   Discussed some of her difficulty with sexual intercourse.  I think this is more related to the length and not the caliber of her vagina.  She tolerated a speculum exam with a regular speculum without any issues today.  She continues to use lubrication for both dilator use and speculum.  I encouraged her to continue regular dilator use and to explore options to help decrease the functional length of her partner's penis.  Patient has also started therapy given her difficulties with the cancer diagnosis and fears about recurrence.  Per NCCN surveillance recommendations, we will continue with visits alternating between our office and radiation oncology every 6 months.  We reviewed signs and symptoms that should prompt a phone call before her next scheduled visit.  I have asked her to call back after the new year to get a visit scheduled with me 6 months after her visit with [name] in  October.  22 minutes of total time was spent for this patient encounter, including preparation, face-to-face counseling with the patient and coordination of care, and documentation of the encounter.  Eugene Garnet, MD  Division of Gynecologic Oncology  Department of Obstetrics and Gynecology  [facility]

## 2022-06-11 ENCOUNTER — Encounter: Payer: Self-pay | Admitting: Gynecologic Oncology

## 2022-06-11 ENCOUNTER — Ambulatory Visit (INDEPENDENT_AMBULATORY_CARE_PROVIDER_SITE_OTHER): Payer: Medicaid Other | Admitting: Family Medicine

## 2022-06-11 ENCOUNTER — Inpatient Hospital Stay: Payer: Medicaid Other | Attending: Gynecologic Oncology | Admitting: Gynecologic Oncology

## 2022-06-11 ENCOUNTER — Other Ambulatory Visit: Payer: Self-pay

## 2022-06-11 ENCOUNTER — Encounter (INDEPENDENT_AMBULATORY_CARE_PROVIDER_SITE_OTHER): Payer: Self-pay | Admitting: Family Medicine

## 2022-06-11 VITALS — BP 142/82 | HR 86 | Temp 98.2°F | Ht 61.0 in | Wt 222.0 lb

## 2022-06-11 VITALS — BP 153/69 | HR 87 | Temp 98.4°F | Wt 227.0 lb

## 2022-06-11 DIAGNOSIS — E785 Hyperlipidemia, unspecified: Secondary | ICD-10-CM | POA: Diagnosis not present

## 2022-06-11 DIAGNOSIS — C539 Malignant neoplasm of cervix uteri, unspecified: Secondary | ICD-10-CM

## 2022-06-11 DIAGNOSIS — E669 Obesity, unspecified: Secondary | ICD-10-CM | POA: Diagnosis not present

## 2022-06-11 DIAGNOSIS — Z08 Encounter for follow-up examination after completed treatment for malignant neoplasm: Secondary | ICD-10-CM | POA: Diagnosis not present

## 2022-06-11 DIAGNOSIS — Z6839 Body mass index (BMI) 39.0-39.9, adult: Secondary | ICD-10-CM

## 2022-06-11 DIAGNOSIS — E1165 Type 2 diabetes mellitus with hyperglycemia: Secondary | ICD-10-CM

## 2022-06-11 DIAGNOSIS — N941 Unspecified dyspareunia: Secondary | ICD-10-CM

## 2022-06-11 DIAGNOSIS — N1831 Chronic kidney disease, stage 3a: Secondary | ICD-10-CM

## 2022-06-11 DIAGNOSIS — E1169 Type 2 diabetes mellitus with other specified complication: Secondary | ICD-10-CM | POA: Diagnosis not present

## 2022-06-11 DIAGNOSIS — Z923 Personal history of irradiation: Secondary | ICD-10-CM | POA: Insufficient documentation

## 2022-06-11 DIAGNOSIS — Z7985 Long-term (current) use of injectable non-insulin antidiabetic drugs: Secondary | ICD-10-CM

## 2022-06-11 DIAGNOSIS — Z8541 Personal history of malignant neoplasm of cervix uteri: Secondary | ICD-10-CM | POA: Diagnosis not present

## 2022-06-11 DIAGNOSIS — Z9221 Personal history of antineoplastic chemotherapy: Secondary | ICD-10-CM | POA: Insufficient documentation

## 2022-06-11 DIAGNOSIS — Z6841 Body Mass Index (BMI) 40.0 and over, adult: Secondary | ICD-10-CM

## 2022-06-11 MED ORDER — SEMAGLUTIDE (1 MG/DOSE) 4 MG/3ML ~~LOC~~ SOPN: 1.0000 mg | PEN_INJECTOR | SUBCUTANEOUS | 0 refills | Status: DC

## 2022-06-11 MED ORDER — ATORVASTATIN CALCIUM 10 MG PO TABS: 10.0000 mg | ORAL_TABLET | Freq: Every day | ORAL | 0 refills | Status: DC

## 2022-06-11 NOTE — Patient Instructions (Signed)
It was good to see you today.  I do not see or feel any evidence of cancer recurrence on your exam.  I will see you for follow-up in 12 months.  Please call after your visit with Dr.Kinard to schedule a visit to see me around this time next year.  Office will contact you with your Pap results.  As always, if you develop any new and concerning symptoms before your next visit, please call to see me sooner.

## 2022-06-11 NOTE — Progress Notes (Signed)
Chief Complaint:   OBESITY Tamara Osborne is here to discuss her progress with her obesity treatment plan along with follow-up of her obesity related diagnoses. Tamara Osborne is on the Category 3 Plan and states she is following her eating plan approximately 85% of the time. Tamara Osborne states she is class 20-45 minutes 2-3 times per week.  Today's visit was #: 41 Starting weight: 208 lbs Starting date: 09/20/2019 Today's weight: 222 lbs Today's date: 06/11/2022 Total lbs lost to date: 0 Total lbs lost since last in-office visit: +5 lbs  Interim History: Patient is still struggling with eye issues and is now using eye lubricants.  Foodwise she has been very compliant with plan. She finally got approval for her gym membership and is back at the [facility].  She is waiting for clearance to get back to the water aerobics.  Lunch is 3 wrap rolls with philly steak and thin cheese.  Evening was fresh green beans 2 cups of rice and meat. She was trying to eat rice instead of bread because she felt more full on the bread.  She was given gabapentin for neuropathy and was sleeping 12-13 hours on that medication.  No plans for events or activities over the next few weeks.  Subjective:   1. Hyperlipidemia associated with type 2 diabetes mellitus Patient is on Lipitor daily.  Patient denies transaminitis or myalgias.  2. Type 2 diabetes mellitus with hyperglycemia, without long-term current use of insulin Patient is on Ozempic 0.5 mg weekly.  Patient denies GI side effects, but still really craving carbs.  Assessment/Plan:   1. Hyperlipidemia associated with type 2 diabetes mellitus Refill- atorvastatin (LIPITOR) 10 MG tablet; Take 1 tablet (10 mg total) by mouth daily.  Dispense: 90 tablet; Refill: 0  2. Type 2 diabetes mellitus with hyperglycemia, without long-term current use of insulin Refill- Semaglutide, 1 MG/DOSE, 4 MG/3ML SOPN; Inject 1 mg as directed once a week.  Dispense: 3 mL; Refill: 0  3.  Obesity: Starting BMI 39.3  4. BMI 40.0-44.9, adult Tamara Osborne is currently in the action stage of change. As such, her goal is to continue with weight loss efforts. She has agreed to the Category 3 Plan.   Exercise goals:  As is.   Behavioral modification strategies: increasing lean protein intake, meal planning and cooking strategies, keeping healthy foods in the home, and planning for success.  Tamara Osborne has agreed to follow-up with our clinic in 8 weeks. She was informed of the importance of frequent follow-up visits to maximize her success with intensive lifestyle modifications for her multiple health conditions.   Objective:   Blood pressure (!) 142/82, pulse 86, temperature 98.2 F (36.8 C), height  (1.549 m), weight 222 lb (100.7 kg), SpO2 97 %. Body mass index is 41.95 kg/m.  General: Cooperative, alert, well developed, in no acute distress. HEENT: Conjunctivae and lids unremarkable. Cardiovascular: Regular rhythm.  Lungs: Normal work of breathing. Neurologic: No focal deficits.   Lab Results  Component Value Date   CREATININE 1.21 (H) 10/23/2021   BUN 22 (H) 10/23/2021   NA 138 10/23/2021   K 4.0 10/23/2021   CL 106 10/23/2021   CO2 27 10/23/2021   Lab Results  Component Value Date   ALT 33 10/23/2021   AST 18 10/23/2021   ALKPHOS 84 10/23/2021   BILITOT 0.4 10/23/2021   Lab Results  Component Value Date   HGBA1C 5.4 06/05/2021   HGBA1C 5.2 01/13/2021   HGBA1C 7.5 (H) 10/18/2020  HGBA1C 6.0 (H) 05/20/2020   HGBA1C 5.2 10/30/2019   Lab Results  Component Value Date   INSULIN 40.3 (H) 09/20/2019   Lab Results  Component Value Date   TSH 1.910 09/20/2019   Lab Results  Component Value Date   CHOL 221 (H) 06/05/2021   HDL 37 (L) 06/05/2021   LDLCALC 158 (H) 06/05/2021   TRIG 130 06/05/2021   CHOLHDL 6.0 06/05/2021   Lab Results  Component Value Date   VD25OH 66.26 06/05/2021   VD25OH 59.37 10/18/2020   VD25OH 45.60 05/20/2020   Lab  Results  Component Value Date   WBC 7.9 10/23/2021   HGB 13.6 10/23/2021   HCT 38.6 10/23/2021   MCV 79.8 (L) 10/23/2021   PLT 278 10/23/2021   Lab Results  Component Value Date   IRON 17 (L) 03/27/2019   TIBC 283 03/27/2019   FERRITIN 32 03/27/2019   Attestation Statements:   Reviewed by clinician on day of visit: allergies, medications, problem list, medical history, surgical history, family history, social history, and previous encounter notes.  I, Malcolm Metro, RMA, am acting as transcriptionist for Reuben Likes, MD.  I have reviewed the above documentation for accuracy and completeness, and I agree with the above. - Reuben Likes, MD

## 2022-06-22 LAB — CYTOLOGY - PAP
Comment: NEGATIVE
Diagnosis: NEGATIVE
Diagnosis: REACTIVE
High risk HPV: NEGATIVE

## 2022-06-24 ENCOUNTER — Ambulatory Visit: Payer: Medicaid Other | Admitting: Podiatry

## 2022-06-25 ENCOUNTER — Ambulatory Visit (INDEPENDENT_AMBULATORY_CARE_PROVIDER_SITE_OTHER): Payer: Medicaid Other | Admitting: Podiatry

## 2022-06-25 ENCOUNTER — Encounter: Payer: Self-pay | Admitting: Podiatry

## 2022-06-25 DIAGNOSIS — E1140 Type 2 diabetes mellitus with diabetic neuropathy, unspecified: Secondary | ICD-10-CM

## 2022-06-25 DIAGNOSIS — M79674 Pain in right toe(s): Secondary | ICD-10-CM | POA: Diagnosis not present

## 2022-06-25 DIAGNOSIS — M79675 Pain in left toe(s): Secondary | ICD-10-CM

## 2022-06-25 DIAGNOSIS — B351 Tinea unguium: Secondary | ICD-10-CM

## 2022-06-25 NOTE — Progress Notes (Signed)
Subjective:   Patient ID: Tamara Osborne, female   DOB: 47 y.o.   MRN: [id number]   HPI Patient states she has elongated thickened nails and she is a diabetic and does have some neurological manifestations    ROS      Objective:  Physical Exam  Nails are thick and 1-5 both feet with diminishment of sharp dull vibratory bilateral long-term diabetes      Assessment:  Chronic mycosis with at risk patient who cannot take care of these thick deformed nailbeds     Plan:  Debrided nailbeds 1-5 both feet no angiogenic bleeding reappoint routine care

## 2022-07-08 NOTE — Progress Notes (Signed)
TeleHealth Visit:  This visit was completed with telemedicine (audio/video) technology. Tamara Osborne has verbally consented to this TeleHealth visit. The patient is located at home, the provider is located at home. The participants in this visit include the listed provider and patient. The visit was conducted today via MyChart video.  Tamara Osborne is here to discuss Tamara Osborne progress with Tamara Osborne Tamara treatment plan along with follow-up of Tamara Osborne Tamara related diagnoses.   Today's visit was # 42 Starting weight: 208 lbs Starting date: 09/20/2019 Weight at last in office visit: 222 lbs on 06/11/22 Total weight loss: 0 lbs at last in office visit on 06/11/22. Today's reported weight (07/09/22): none reported  Nutrition Plan: the Category 3 plan   Current exercise:  none the past 2 weeks  Interim History:  Tamara Osborne is still having eye issues and seeing the eye doctor today. Tamara Osborne is now off of gabapentin- it was making Tamara Osborne sleep 13-14 hrs daily. Tamara Osborne feels Tamara Osborne sticking to plan well.  However Tamara Osborne reports some deviations from the plan. Tamara Osborne is not able to eat all of Tamara Osborne food. Tamara Osborne feels the Ozempic dose may be too strong. Tamara Osborne is not skipping meals but not getting all of the protein in.  Tamara Osborne is using the protein equivalent list to make up protein.   Eating all of the prescribed protein: no Skipping meals: No Drinking adequate water: Yes Drinking sugar sweetened beverages: No Hunger controlled: well controlled. Cravings controlled:  well controlled.  Assessment/Plan:  1. Type 2 Diabetes Mellitus with CKD stage IIIa, without long-term current use of insulin HgbA1c is at goal. Last A1c was 5.4 CBGs: only checks if Tamara Osborne has symtoms of hypoglycemia  Episodes of hypoglycemia: no Medication(s): Ozempic 1 mg SQ weekly  Lab Results  Component Value Date   HGBA1C 5.4 06/05/2021   HGBA1C 5.2 01/13/2021   HGBA1C 7.5 (H) 10/18/2020   Lab Results  Component Value Date   LDLCALC 158 (H) 06/05/2021    CREATININE 1.21 (H) 10/23/2021   No results found for: "GFR"  Plan: Continue and refill Ozempic 1 mg SQ weekly Decrease dose to 0.75 mg weekly (54 clicks).  Instructions sent via MyChart. Check A1c.  2. Hyperlipidemia LDL is not at goal.  Last LDL was 158 on 06/05/2021.  HDL low at 37 and triglycerides normal. Medication(s): Lipitor 10 mg daily.  Lab Results  Component Value Date   CHOL 221 (H) 06/05/2021   HDL 37 (L) 06/05/2021   LDLCALC 158 (H) 06/05/2021   TRIG 130 06/05/2021   CHOLHDL 6.0 06/05/2021   CHOLHDL 6.7 05/20/2020   CHOLHDL 6.2 01/22/2020   Lab Results  Component Value Date   ALT 33 10/23/2021   AST 18 10/23/2021   ALKPHOS 84 10/23/2021   BILITOT 0.4 10/23/2021   The 10-year ASCVD risk score (Arnett DK, et al., 2019) is: 6.9%   Values used to calculate the score:     Age: 47 years     Sex: Female     Is Non-Hispanic African American: No     Diabetic: Yes     Tobacco smoker: No     Systolic Blood Pressure: 153 mmHg     Is BP treated: Yes     HDL Cholesterol: 37 mg/dL     Total Cholesterol: 221 mg/dL  Plan: Continue Lipitor 10 mg daily. Consider increasing dose if LDL is not at goal. Check lipid profile.  3. Vitamin D Deficiency Vitamin D is at goal of 50.  Most recent vitamin D  level was 66. Tamara Osborne is on OTC vitamin D3 2000 IU daily. Lab Results  Component Value Date   VD25OH 66.26 06/05/2021   VD25OH 59.37 10/18/2020   VD25OH 45.60 05/20/2020    Plan: Continue OTC vitamin D 2000 IU daily. Check vitamin D level.   4. Morbid Tamara: Current BMI 42  Tamara Osborne will go to [facility] to have fasting labs drawn before Tamara Osborne next visit. Instructed Tamara Osborne to be well-hydrated when Tamara Osborne goes. Tamara Osborne has had unsuccessful attempts to draw blood at our clinic and also at [facility] in the past. Tamara Osborne is currently in the action stage of change. As such, Tamara Osborne goal is to continue with weight loss efforts.  Tamara Osborne has agreed to keeping a food journal with goal of 1400-1500  calories and 90 grams of protein daily.  MyChart message sent with journaling information. I suggested Tamara Osborne use the Lose It! app since it still has the free barcode scanner.  Exercise goals: Start back to weekly swim classes one weekly.  Behavioral modification strategies: increasing lean protein intake, decreasing simple carbohydrates , and keep a strict food journal.  Tamara Osborne has agreed to follow-up with our clinic in 5 weeks.   Orders Placed This Encounter  Procedures   CBC with Differential/Platelet   Comprehensive metabolic panel   Hemoglobin A1c   Lipid Panel With LDL/HDL Ratio   T3   T4, free   TSH   VITAMIN D 25 Hydroxy (Vit-D Deficiency, Fractures)    Medications Discontinued During This Encounter  Medication Reason   Semaglutide, 1 MG/DOSE, 4 MG/3ML SOPN Reorder     Meds ordered this encounter  Medications   Semaglutide, 1 MG/DOSE, 4 MG/3ML SOPN    Sig: Inject 1 mg as directed once a week.    Dispense:  3 mL    Refill:  0    Order Specific Question:   Supervising Provider    Answer:   Tamara Osborne [[id number]]      Objective:   VITALS: Per patient if applicable, see vitals. GENERAL: Alert and in no acute distress. CARDIOPULMONARY: No increased WOB. Speaking in clear sentences.  PSYCH: Pleasant and cooperative. Speech normal rate and rhythm. Affect is appropriate. Insight and judgement are appropriate. Attention is focused, linear, and appropriate.  NEURO: Oriented as arrived to appointment on time with no prompting.   Attestation Statements:   Reviewed by clinician on day of visit: allergies, medications, problem list, medical history, surgical history, family history, social history, and previous encounter notes.   This was prepared with the assistance of Engineer, civil (consulting).  Occasional wrong-word or sound-a-like substitutions may have occurred due to the inherent limitations of voice recognition software.

## 2022-07-09 ENCOUNTER — Encounter (INDEPENDENT_AMBULATORY_CARE_PROVIDER_SITE_OTHER): Payer: Self-pay | Admitting: Family Medicine

## 2022-07-09 ENCOUNTER — Telehealth (INDEPENDENT_AMBULATORY_CARE_PROVIDER_SITE_OTHER): Payer: Medicaid Other | Admitting: Family Medicine

## 2022-07-09 ENCOUNTER — Telehealth: Payer: Self-pay | Admitting: Surgery

## 2022-07-09 DIAGNOSIS — E669 Obesity, unspecified: Secondary | ICD-10-CM

## 2022-07-09 DIAGNOSIS — N1831 Chronic kidney disease, stage 3a: Secondary | ICD-10-CM

## 2022-07-09 DIAGNOSIS — E559 Vitamin D deficiency, unspecified: Secondary | ICD-10-CM | POA: Diagnosis not present

## 2022-07-09 DIAGNOSIS — E785 Hyperlipidemia, unspecified: Secondary | ICD-10-CM

## 2022-07-09 DIAGNOSIS — E1169 Type 2 diabetes mellitus with other specified complication: Secondary | ICD-10-CM

## 2022-07-09 DIAGNOSIS — E1122 Type 2 diabetes mellitus with diabetic chronic kidney disease: Secondary | ICD-10-CM

## 2022-07-09 DIAGNOSIS — Z0000 Encounter for general adult medical examination without abnormal findings: Secondary | ICD-10-CM

## 2022-07-09 DIAGNOSIS — Z6841 Body Mass Index (BMI) 40.0 and over, adult: Secondary | ICD-10-CM

## 2022-07-09 DIAGNOSIS — Z7985 Long-term (current) use of injectable non-insulin antidiabetic drugs: Secondary | ICD-10-CM

## 2022-07-09 MED ORDER — SEMAGLUTIDE (1 MG/DOSE) 4 MG/3ML ~~LOC~~ SOPN
1.0000 mg | PEN_INJECTOR | SUBCUTANEOUS | 0 refills | Status: DC
Start: 2022-07-09 — End: 2022-08-13

## 2022-07-09 NOTE — Telephone Encounter (Signed)
Called patient and let her know she can come in tomorrow at 1:30pm for have bump swabbed. Patient agreed to this, appt scheduled while on the phone. No further concerns at this time.

## 2022-07-09 NOTE — Telephone Encounter (Signed)
The patient can be offered an appt with Melissa. Thank you

## 2022-07-09 NOTE — Telephone Encounter (Signed)
Patient called in stating she has a bump on her butt cheek. Pt states she previously mentioned it to Dr Pricilla Holm and was told it could potentially be herpes but the bump wasn't present at the time of that appointment. Since bump has returned, pt requested an appointment with Dr Pricilla Holm to have it evaluated. Patient does not have a ob/gyn she can see for this. Patient advised to keep area clean and dry and consider using sitz bath, and that our office will call her back with Dr Winferd Humphrey recommendations. Patient verbalized understanding and had no further concerns.

## 2022-07-10 ENCOUNTER — Other Ambulatory Visit: Payer: Self-pay

## 2022-07-10 ENCOUNTER — Inpatient Hospital Stay: Payer: Medicaid Other | Attending: Gynecologic Oncology | Admitting: Gynecologic Oncology

## 2022-07-10 ENCOUNTER — Ambulatory Visit: Payer: Medicaid Other

## 2022-07-10 VITALS — BP 150/83 | HR 96 | Temp 98.6°F | Resp 20 | Ht 62.0 in | Wt 231.0 lb

## 2022-07-10 DIAGNOSIS — L989 Disorder of the skin and subcutaneous tissue, unspecified: Secondary | ICD-10-CM | POA: Diagnosis not present

## 2022-07-10 DIAGNOSIS — Z113 Encounter for screening for infections with a predominantly sexual mode of transmission: Secondary | ICD-10-CM | POA: Insufficient documentation

## 2022-07-10 DIAGNOSIS — Z9221 Personal history of antineoplastic chemotherapy: Secondary | ICD-10-CM | POA: Diagnosis not present

## 2022-07-10 DIAGNOSIS — Z923 Personal history of irradiation: Secondary | ICD-10-CM | POA: Insufficient documentation

## 2022-07-10 DIAGNOSIS — Z8541 Personal history of malignant neoplasm of cervix uteri: Secondary | ICD-10-CM | POA: Diagnosis present

## 2022-07-10 MED ORDER — MUPIROCIN 2 % EX OINT
1.0000 | TOPICAL_OINTMENT | Freq: Two times a day (BID) | CUTANEOUS | 0 refills | Status: DC
Start: 2022-07-10 — End: 2022-07-16

## 2022-07-10 NOTE — Progress Notes (Unsigned)
Patient is seen today in the office for evaluation of a skin lesion on the upper left buttocks.  She states that she has had this for years.  She first noticed this in 2015 and it comes in the same spot.  She may only have this once a year and the onset is sporadic.  She feels like she has not had this in the last 2 to 3 years.  She states for she will notice a knot in the area and can feel pus underneath the skin.  The area is not painful but has a strong sensation of needing to itch.  She does report once the area is healed up having mild tenderness in that area.  She can tell that there is been drainage because she can see spots on her underwear.  No fever or chills.  No other skin lesions noted on the body.  She is doing well since her last visit.  She is trying to increase her activity with attempts to try to lose some weight.  Bladder is functioning without difficulty.  No change in bowel habits.  No vaginal discharge or bleeding reported.  No new lower extremity edema reported. The area pops with warm compresses. No changes in detergent etc. Had chicken pox as a child.

## 2022-07-10 NOTE — Patient Instructions (Signed)
We took a culture and viral swab today to test the area. We will contact you with the results.   You can place warm compresses over the area for the next several days. Try to avoid scratching if able.   I have sent in topical bactroban ointment to apply to the area at least twice daily while we are waiting for the results.   Please call for any needs or concerns.

## 2022-07-11 LAB — AEROBIC CULTURE W GRAM STAIN (SUPERFICIAL SPECIMEN)

## 2022-07-12 ENCOUNTER — Other Ambulatory Visit (HOSPITAL_COMMUNITY)
Admission: RE | Admit: 2022-07-12 | Discharge: 2022-07-12 | Disposition: A | Discharge status: Home / Self Care | Payer: Medicaid Other | Source: Ambulatory Visit | Attending: Gynecologic Oncology | Admitting: Gynecologic Oncology

## 2022-07-12 ENCOUNTER — Other Ambulatory Visit (HOSPITAL_COMMUNITY)
Admission: RE | Admit: 2022-07-12 | Discharge: 2022-07-12 | Disposition: A | Discharge status: Home / Self Care | Payer: Medicaid Other | Source: Other Acute Inpatient Hospital | Attending: Gynecologic Oncology | Admitting: Gynecologic Oncology

## 2022-07-12 DIAGNOSIS — L989 Disorder of the skin and subcutaneous tissue, unspecified: Secondary | ICD-10-CM | POA: Insufficient documentation

## 2022-07-12 LAB — AEROBIC CULTURE W GRAM STAIN (SUPERFICIAL SPECIMEN)

## 2022-07-13 ENCOUNTER — Telehealth: Payer: Self-pay | Admitting: Gynecologic Oncology

## 2022-07-13 ENCOUNTER — Other Ambulatory Visit: Payer: Self-pay | Admitting: Gynecologic Oncology

## 2022-07-13 DIAGNOSIS — L989 Disorder of the skin and subcutaneous tissue, unspecified: Secondary | ICD-10-CM

## 2022-07-13 DIAGNOSIS — B009 Herpesviral infection, unspecified: Secondary | ICD-10-CM

## 2022-07-13 LAB — HSV 1/2 PCR (SURFACE)
HSV-1 DNA: NOT DETECTED
HSV-2 DNA: DETECTED — AB

## 2022-07-13 LAB — AEROBIC CULTURE W GRAM STAIN (SUPERFICIAL SPECIMEN)
Culture: NORMAL
Gram Stain: NONE SEEN

## 2022-07-13 MED ORDER — ACYCLOVIR 5 % EX OINT
1.0000 | TOPICAL_OINTMENT | CUTANEOUS | 3 refills | Status: DC
Start: 2022-07-13 — End: 2022-10-08

## 2022-07-13 NOTE — Telephone Encounter (Signed)
Called patient and discussed recent culture results and HSV testing from the skin lesion on the upper aspect of her left buttocks.  She states she had a little bit of drainage on Saturday morning and none since.  She states after her visit in the office the itching had improved.  Discussed HSV-2 + results. We will plan to start treatment with acyclovir. Reportable signs and symptoms reviewed. She is aware that this can be something that occurs sporadically and may require treatment at that time. She will call for any needs, worsening symptoms.

## 2022-07-14 DIAGNOSIS — L989 Disorder of the skin and subcutaneous tissue, unspecified: Secondary | ICD-10-CM

## 2022-07-14 HISTORY — DX: Disorder of the skin and subcutaneous tissue, unspecified: L98.9

## 2022-07-16 ENCOUNTER — Encounter (HOSPITAL_BASED_OUTPATIENT_CLINIC_OR_DEPARTMENT_OTHER): Payer: Self-pay | Admitting: Family Medicine

## 2022-07-16 ENCOUNTER — Ambulatory Visit (HOSPITAL_BASED_OUTPATIENT_CLINIC_OR_DEPARTMENT_OTHER): Payer: Medicaid Other | Admitting: Family Medicine

## 2022-07-16 VITALS — BP 175/102 | HR 99 | Ht 62.0 in | Wt 228.2 lb

## 2022-07-16 DIAGNOSIS — I152 Hypertension secondary to endocrine disorders: Secondary | ICD-10-CM

## 2022-07-16 DIAGNOSIS — E1159 Type 2 diabetes mellitus with other circulatory complications: Secondary | ICD-10-CM

## 2022-07-16 DIAGNOSIS — Z7689 Persons encountering health services in other specified circumstances: Secondary | ICD-10-CM

## 2022-07-16 DIAGNOSIS — E1122 Type 2 diabetes mellitus with diabetic chronic kidney disease: Secondary | ICD-10-CM

## 2022-07-16 DIAGNOSIS — N1831 Chronic kidney disease, stage 3a: Secondary | ICD-10-CM | POA: Diagnosis not present

## 2022-07-16 DIAGNOSIS — N183 Chronic kidney disease, stage 3 unspecified: Secondary | ICD-10-CM | POA: Diagnosis not present

## 2022-07-16 DIAGNOSIS — Z8541 Personal history of malignant neoplasm of cervix uteri: Secondary | ICD-10-CM | POA: Insufficient documentation

## 2022-07-16 DIAGNOSIS — F411 Generalized anxiety disorder: Secondary | ICD-10-CM

## 2022-07-16 HISTORY — DX: Personal history of malignant neoplasm of cervix uteri: Z85.41

## 2022-07-16 LAB — POCT GLYCOSYLATED HEMOGLOBIN (HGB A1C): Hemoglobin A1C: 5.3 % (ref 4.0–5.6)
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
## 2022-08-05 NOTE — Progress Notes (Unsigned)
Established Patient Office Visit  Subjective   Patient ID: Tamara Osborne, female    DOB: 1975/03/06  Age: 47 y.o. MRN: [id number]  Tamara Osborne is a 47 year-old female patient who presents today for hypertension follow-up.   Amlodipine-valsartan 10-320mg  daily & Chlorthalidone 25mg  daily Home BP readings: about 150-160s  Has been having a few headaches  Denies chest pain, palpitations, changes in vision, shortness of breath, lower extremity edema, lightheadedness, weakness, cough.    Reports she is continuing to have issues with her eyes  Unsure what is causing issues with her eyes She feels that her eyes may be related to allergic symptoms- she brought in an allergy testing from when she was 47 yrs old Possible allergy issues for 2 years-- having issues with dry eyes She is currently seeing an ophthalmologist, going to see him today   Tried to get labs at last visit but unsuccessful  Will attempt again today  Review of Systems  Constitutional:  Negative for malaise/fatigue.  Eyes:  Negative for blurred vision and double vision.  Respiratory:  Negative for cough and shortness of breath.   Cardiovascular:  Negative for chest pain and palpitations.  Gastrointestinal:  Negative for abdominal pain, nausea and vomiting.  Musculoskeletal:  Negative for myalgias.  Neurological:  Negative for dizziness, weakness and headaches.  Psychiatric/Behavioral:  Negative for depression and suicidal ideas. The patient is not nervous/anxious.      Objective:    BP (!) 154/92   Pulse 86   Ht 5\' 2"  (1.575 m)   Wt 229 lb (103.9 kg)   SpO2 99%   BMI 41.88 kg/m  BP Readings from Last 3 Encounters:  08/06/22 (!) 154/92  07/16/22 (!) 175/102  07/10/22 (!) 150/83     Physical Exam Constitutional:      Appearance: Normal appearance.  Cardiovascular:     Rate and Rhythm: Normal rate and regular rhythm.     Pulses: Normal pulses.     Heart sounds: Normal heart sounds.  Pulmonary:      Effort: Pulmonary effort is normal.     Breath sounds: Normal breath sounds.  Neurological:     Mental Status: She is alert.     Assessment & Plan:  1. Uncontrolled hypertension Patient presents today with initially elevated systolic blood pressure, recheck with both systolic and diastolic readings elevated. Patient in no acute distress and is well-appearing. Denies chest pain, shortness of breath, lower extremity edema, vision changes, headaches. Cardiovascular exam with heart regular rate and rhythm. Normal heart sounds, no murmurs present. No lower extremity edema present. Lungs clear to auscultation bilaterally. Patient is currently taking amlodipine-valsartan 10-320mg  daily & chlorthalidone 25mg  daily. No refills needed. Advised patient that her anti-hypertensive medication regimen is at the highest dose for each medication. Appreciate strict blood pressure control due to severely elevated microalbumin. Referral placed for [facility]. Will attempt to obtain blood work today to assess kidney function. Patient states that her previous provider referred her to nephrology but she has not seen them recently. Will place referral to nephrology at this time for assistance.  - Ambulatory referral to [facility] - Comprehensive metabolic panel - Ambulatory referral to Nephrology  2. Encounter for allergy testing Patient presents today with continued concern for eye dryness/redness/drainage. No red flags present on exam. She is currently seeing an ophthalmologist for this and reports that during her diabetic eye exam, there were no abnormalities noted. She is unsure if this has  to be with various allergies she has had since childhood. Reasonable to send her to allergist to have allergy panel repeated as an adult. Referral placed today.  - Ambulatory referral to Allergy  3. Pain in right leg Patient reports experiencing sporadic pain with numbness/tingling in her  right leg. Denies saddle anesthesia, bowel or bladder incontinence, low back pain, trauma/injury, muscle weakness, gait abnormality, fever/chills. She reports that she noticed it the other day at the grocery store but was unable to recall if she had been standing for a prolonged period. She states that it normally happens when she is sitting for a prolonged period. No back pain present to palpation. Negative SLR on both sides. Advised patient to perform hamstring stretches. Provided handout. Advised patient to return to office if symptoms persist or worsen.   Return in about 6 weeks (around 09/17/2022) for HTN follow-up.    Alyson Reedy, FNP

## 2022-08-06 ENCOUNTER — Ambulatory Visit (HOSPITAL_BASED_OUTPATIENT_CLINIC_OR_DEPARTMENT_OTHER): Payer: Medicaid Other | Admitting: Family Medicine

## 2022-08-06 ENCOUNTER — Ambulatory Visit (INDEPENDENT_AMBULATORY_CARE_PROVIDER_SITE_OTHER): Payer: Medicaid Other | Admitting: Primary Care

## 2022-08-06 ENCOUNTER — Encounter (HOSPITAL_BASED_OUTPATIENT_CLINIC_OR_DEPARTMENT_OTHER): Payer: Self-pay | Admitting: Family Medicine

## 2022-08-06 VITALS — BP 154/92 | HR 86 | Ht 62.0 in | Wt 229.0 lb

## 2022-08-06 DIAGNOSIS — D539 Nutritional anemia, unspecified: Secondary | ICD-10-CM

## 2022-08-06 DIAGNOSIS — Z0000 Encounter for general adult medical examination without abnormal findings: Secondary | ICD-10-CM | POA: Diagnosis not present

## 2022-08-06 DIAGNOSIS — N1832 Chronic kidney disease, stage 3b: Secondary | ICD-10-CM

## 2022-08-06 DIAGNOSIS — E1122 Type 2 diabetes mellitus with diabetic chronic kidney disease: Secondary | ICD-10-CM | POA: Diagnosis not present

## 2022-08-06 DIAGNOSIS — I10 Essential (primary) hypertension: Secondary | ICD-10-CM | POA: Diagnosis not present

## 2022-08-06 DIAGNOSIS — Z794 Long term (current) use of insulin: Secondary | ICD-10-CM

## 2022-08-06 DIAGNOSIS — Z0182 Encounter for allergy testing: Secondary | ICD-10-CM | POA: Insufficient documentation

## 2022-08-06 DIAGNOSIS — M79604 Pain in right leg: Secondary | ICD-10-CM

## 2022-08-07 LAB — CBC WITH DIFFERENTIAL/PLATELET
Basophils Absolute: 0.1 10*3/uL (ref 0.0–0.2)
Basos: 1 %
EOS (ABSOLUTE): 0.5 10*3/uL — ABNORMAL HIGH (ref 0.0–0.4)
Eos: 5 %
Hematocrit: 45.3 % (ref 34.0–46.6)
Hemoglobin: 15.3 g/dL (ref 11.1–15.9)
Immature Grans (Abs): 0.1 10*3/uL (ref 0.0–0.1)
Immature Granulocytes: 1 %
Lymphocytes Absolute: 1.5 10*3/uL (ref 0.7–3.1)
Lymphs: 16 %
MCH: 27.9 pg (ref 26.6–33.0)
MCHC: 33.8 g/dL (ref 31.5–35.7)
MCV: 83 fL (ref 79–97)
Monocytes Absolute: 0.5 10*3/uL (ref 0.1–0.9)
Monocytes: 5 %
Neutrophils Absolute: 6.7 10*3/uL (ref 1.4–7.0)
Neutrophils: 72 %
Platelets: 326 10*3/uL (ref 150–450)
RBC: 5.48 x10E6/uL — ABNORMAL HIGH (ref 3.77–5.28)
RDW: 13.5 % (ref 11.7–15.4)
WBC: 9.3 10*3/uL (ref 3.4–10.8)

## 2022-08-07 LAB — COMPREHENSIVE METABOLIC PANEL
ALT: 48 IU/L — ABNORMAL HIGH (ref 0–32)
AST: 27 IU/L (ref 0–40)
Albumin/Globulin Ratio: 1.4 (ref 1.2–2.2)
Albumin: 4.5 g/dL (ref 3.9–4.9)
Alkaline Phosphatase: 97 IU/L (ref 44–121)
BUN/Creatinine Ratio: 15 (ref 9–23)
BUN: 23 mg/dL (ref 6–24)
Bilirubin Total: 0.3 mg/dL (ref 0.0–1.2)
CO2: 20 mmol/L (ref 20–29)
Calcium: 10.2 mg/dL (ref 8.7–10.2)
Chloride: 102 mmol/L (ref 96–106)
Creatinine, Ser: 1.5 mg/dL — ABNORMAL HIGH (ref 0.57–1.00)
Globulin, Total: 3.3 g/dL (ref 1.5–4.5)
Glucose: 94 mg/dL (ref 70–99)
Potassium: 4.9 mmol/L (ref 3.5–5.2)
Sodium: 140 mmol/L (ref 134–144)
Total Protein: 7.8 g/dL (ref 6.0–8.5)
eGFR: 43 mL/min/{1.73_m2} — ABNORMAL LOW (ref >59)

## 2022-08-07 LAB — LIPID PANEL
Chol/HDL Ratio: 5.1 ratio — ABNORMAL HIGH (ref 0.0–4.4)
Cholesterol, Total: 209 mg/dL — ABNORMAL HIGH (ref 100–199)
HDL: 41 mg/dL (ref >39)
LDL Chol Calc (NIH): 138 mg/dL — ABNORMAL HIGH (ref 0–99)
Triglycerides: 168 mg/dL — ABNORMAL HIGH (ref 0–149)
VLDL Cholesterol Cal: 30 mg/dL (ref 5–40)

## 2022-08-07 LAB — TSH RFX ON ABNORMAL TO FREE T4: TSH: 1.85 u[IU]/mL (ref 0.450–4.500)

## 2022-08-07 LAB — HEMOGLOBIN A1C
Est. average glucose Bld gHb Est-mCnc: 123 mg/dL
Hgb A1c MFr Bld: 5.9 % — ABNORMAL HIGH (ref 4.8–5.6)

## 2022-08-10 ENCOUNTER — Other Ambulatory Visit (HOSPITAL_BASED_OUTPATIENT_CLINIC_OR_DEPARTMENT_OTHER): Payer: Self-pay | Admitting: Family Medicine

## 2022-08-10 DIAGNOSIS — N1832 Chronic kidney disease, stage 3b: Secondary | ICD-10-CM

## 2022-08-11 ENCOUNTER — Other Ambulatory Visit: Payer: Self-pay

## 2022-08-11 ENCOUNTER — Ambulatory Visit (INDEPENDENT_AMBULATORY_CARE_PROVIDER_SITE_OTHER): Payer: Medicaid Other | Admitting: Family Medicine

## 2022-08-11 ENCOUNTER — Ambulatory Visit: Payer: Medicaid Other | Admitting: Internal Medicine

## 2022-08-11 ENCOUNTER — Encounter: Payer: Self-pay | Admitting: Internal Medicine

## 2022-08-11 VITALS — BP 134/82 | HR 91 | Temp 98.5°F | Resp 18 | Ht 62.0 in | Wt 230.0 lb

## 2022-08-11 DIAGNOSIS — J3089 Other allergic rhinitis: Secondary | ICD-10-CM

## 2022-08-11 DIAGNOSIS — J3081 Allergic rhinitis due to animal (cat) (dog) hair and dander: Secondary | ICD-10-CM | POA: Diagnosis not present

## 2022-08-11 DIAGNOSIS — L500 Allergic urticaria: Secondary | ICD-10-CM | POA: Diagnosis not present

## 2022-08-11 DIAGNOSIS — J301 Allergic rhinitis due to pollen: Secondary | ICD-10-CM | POA: Diagnosis not present

## 2022-08-11 DIAGNOSIS — Z880 Allergy status to penicillin: Secondary | ICD-10-CM

## 2022-08-11 DIAGNOSIS — H1013 Acute atopic conjunctivitis, bilateral: Secondary | ICD-10-CM

## 2022-08-11 DIAGNOSIS — T7802XA Anaphylactic reaction due to shellfish (crustaceans), initial encounter: Secondary | ICD-10-CM

## 2022-08-11 DIAGNOSIS — J343 Hypertrophy of nasal turbinates: Secondary | ICD-10-CM

## 2022-08-11 DIAGNOSIS — H04123 Dry eye syndrome of bilateral lacrimal glands: Secondary | ICD-10-CM

## 2022-08-11 MED ORDER — AZELASTINE HCL 0.1 % NA SOLN: 1.0000 | Freq: Two times a day (BID) | NASAL | 5 refills | Status: DC | PRN

## 2022-08-11 MED ORDER — EPINEPHRINE 0.3 MG/0.3ML IJ SOAJ: 0.3000 mg | INTRAMUSCULAR | 1 refills | Status: DC | PRN

## 2022-08-11 MED ORDER — LEVOCETIRIZINE DIHYDROCHLORIDE 5 MG PO TABS: 5.0000 mg | ORAL_TABLET | Freq: Every evening | ORAL | 1 refills | Status: DC

## 2022-08-11 MED ORDER — FLUTICASONE PROPIONATE 50 MCG/ACT NA SUSP: 2.0000 | Freq: Every day | NASAL | 5 refills | Status: DC

## 2022-08-11 NOTE — Patient Instructions (Addendum)
Tamara Osborne Return in about 6 weeks (around 09/22/2022).   Allergic Rhinitis: - Positive skin test 08/2022: grasses, trees, molds, cats, dogs, dust mite, horses  - Avoidance measures discussed. - Use nasal saline rinses before nose sprays such as with Neilmed Sinus Rinse.  Use distilled water.   - Use Flonase 2 sprays each nostril daily. Aim upward and outward. - Use Azelastine 1-2 sprays each nostril twice daily as needed for runny nose, congestion, drainage, sneezing. Aim upward and outward. - Use Xyal 5mg  daily.  - Continue follow up with Ophthalmology.  On Restasis.   - Of note, she has a history of cervical cancer requiring chemo/radiation.  Discussed she might not be the best candidate for allergy shots due to history of malignancy.  If her allergy symptoms are severe and uncontrolled despite medication use, we can discuss this further as her malignancy is no longer active so it is not a contraindication.     Food allergy:  - today's skin testing was positive for shellfish.  - please strictly avoid shellfish. - Initial rxn: hives. - for SKIN only reaction, okay to take Benadryl 25mg  capsules every 6 hours - for SKIN + ANY additional symptoms, OR IF concern for LIFE THREATENING reaction = Epipen Autoinjector EpiPen 0.3 mg. - If using Epinephrine autoinjector, call 911 or go the ER.     ALLERGEN AVOIDANCE MEASURES   Dust Mites Use central air conditioning and heat; and change the filter monthly.  Pleated filters work better than mesh filters.  Electrostatic filters may also be used; wash the filter monthly.  Window air conditioners may be used, but do not clean the air as well as a central air conditioner.  Change or wash the filter monthly. Keep windows closed.  Do not use attic fans.   Encase the mattress, box springs and pillows with zippered, dust proof covers. Wash the bed linens in hot water weekly.   Remove carpet, especially from the bedroom. Remove stuffed animals,  throw pillows, dust ruffles, heavy drapes and other items that collect dust from the bedroom. Do not use a humidifier.   Use wood, vinyl or leather furniture instead of cloth furniture in the bedroom. Keep the indoor humidity at 30 - 40%.  Monitor with a humidity gauge.  Molds - Indoor avoidance Use air conditioning to reduce indoor humidity.  Do not use a humidifier. Keep indoor humidity at 30 - 40%.  Use a dehumidifier if needed. In the bathroom use an exhaust fan or open a window after showering.  Wipe down damp surfaces after showering.  Clean bathrooms with a mold-killing solution (diluted bleach, or products like Tilex, etc) at least once a month. In the kitchen use an exhaust fan to remove steam from cooking.  Throw away spoiled foods immediately, and empty garbage daily.  Empty water pans below self-defrosting refrigerators frequently. Vent the clothes dryer to the outside. Limit indoor houseplants; mold grows in the dirt.  No houseplants in the bedroom. Remove carpet from the bedroom. Encase the mattress and box springs with a zippered encasing.  Molds - Outdoor avoidance Avoid being outside when the grass is being mowed, or the ground is tilled. Avoid playing in leaves, pine straw, hay, etc.  Dead plant materials contain mold. Avoid going into barns or grain storage areas. Remove leaves, clippings and compost from around the home.  Pollen Avoidance Pollen levels are highest during the mid-day and afternoon.  Consider this when planning outdoor activities. Avoid being outside when  the grass is being mowed, or wear a mask if the pollen-allergic person must be the one to mow the grass. Keep the windows closed to keep pollen outside of the home. Use an air conditioner to filter the air. Take a shower, wash hair, and change clothing after working or playing outdoors during pollen season. Pet Dander Keep the pet out of your bedroom and restrict it to only a few rooms. Be advised that  keeping the pet in only one room will not limit the allergens to that room. Don't pet, hug or kiss the pet; if you do, wash your hands with soap and water. High-efficiency particulate air (HEPA) cleaners run continuously in a bedroom or living room can reduce allergen levels over time. Regular use of a high-efficiency vacuum cleaner or a central vacuum can reduce allergen levels. Giving your pet a bath at least once a week can reduce airborne allergen.

## 2022-08-11 NOTE — Progress Notes (Signed)
NEW PATIENT  Date of Service/Encounter:  08/11/22  Consult requested by: Alyson Reedy, FNP   Subjective:   Tamara Osborne (DOB: [birth year]) is a 47 y.o. female who presents to the clinic on 08/11/2022 with a chief complaint of Allergic Rhinitis  (Constant eye redness, congestion, eye swelling, and post nasal drip randomly  - 6 months of these symptoms with no relief ) and Allergic Reaction (Some food allergies as a child never grew out of shellfish and fish allergy - hives. No issues eating fake fish ( fish sticks and tuna) ) .    History obtained from: chart review and patient.  Rhinitis:  Started in childhood.  Symptoms include: nasal congestion, rhinorrhea, post nasal drainage, sneezing, watery eyes, and itchy eyes  Has itching with cats Since January, her eye symptoms have been uncontrolled with itching, runny eyes and redness. She has seen Optho and has tried multiple different eye drops without much relief; currently on Restasis that is for dry eyes.   Occurs year-round Potential triggers: not sure  Treatments tried:  Thinks she did AIT previously; results from 1982 show positive to unknown grass, ragweed, english plantain weed, cat, dog, DM, maple, oak, cladosporium, alternaria.   Xyzal previously; makes her sleepy but is fine if she takes it at nighttime  Flonase almost daily   Previous allergy testing: yes; in grade school, can't recall results.  History of reflux/heartburn: no History of sinus surgery: no Nonallergic triggers: none    Concern for Food Allergy:  Foods of concern: shellfish  History of reaction:  Thinks it was hives but not sure of exact symptoms. She eats fish sticks and tuna without any issues.   Did have hives when she ate at [employer] once.   Previous allergy testing yes, previously positive on skin testing? But the skin test record from 1982 is negative for shellfish.   Carries an epinephrine autoinjector: no; previously had  one  She is not interested in reintroduction of shellfish or further blood testing at this time.  She is a hard stick and bruises very easily.   Penicillin/Sulfa Allergy: Reports history of this since childhood.  Unknown reaction, maybe a rash? Avoids penicillins.  Also has sufa allergy, again unknown reaction.   Past Medical History: Past Medical History:  Diagnosis Date   Anemia    Angio-edema    Anxiety    Bartholin cyst    Cervical cancer (HCC)    Chronic kidney disease    Constipation    Depression    Deviated septum    Diabetes mellitus without complication (HCC)    Difficult intravenous access    used port for 05-09-2019 surgery   Fibroids    History of blood transfusion    2 units given 04-26-2019, has had total of 8 units since jan 2021   History of blood transfusion 1 unit given 05-30-19   iv fluids also given   History of cervical cancer 07/16/2022   History of kidney problems    History of radiation therapy 03/23/2019-05/04/2019   Cervical external beam   Dr Antony Blackbird   History of radiation therapy 05/09/2019-06/05/2019   vaginal brachytherapy   Dr Antony Blackbird   History of recent blood transfusion 05/16/2019   2 units given  per dr Lambert Mody at cancer center   Hypertension    Neuropathy    both thumbs   Obesity    Palpitations    PONV (postoperative nausea and vomiting)    Prediabetes  Preeclampsia 2006   Sleep apnea    no cpap used insurance would not cover, osa severe per pt   SOB (shortness of breath)    Squamous cell carcinoma of cervix (HCC) 03/23/2019   Swallowing difficulty    Symptomatic skin lesion 07/14/2022   Vitamin D deficiency 01/10/2020    Past Surgical History: Past Surgical History:  Procedure Laterality Date   CESAREAN SECTION WITH BILATERAL TUBAL LIGATION  11/10/2004       DILATION AND CURETTAGE OF UTERUS  1997   IR IMAGING GUIDED PORT INSERTION  04/04/2019   IR REMOVAL TUN ACCESS W/ PORT W/O FL MOD SED  10/27/2021   OPERATIVE  ULTRASOUND N/A 05/09/2019   Procedure: OPERATIVE ULTRASOUND;  Surgeon: Antony Blackbird, MD;  Location: [facility];  Service: Urology;  Laterality: N/A;   OPERATIVE ULTRASOUND N/A 05/15/2019   Procedure: OPERATIVE ULTRASOUND;  Surgeon: Antony Blackbird, MD;  Location: [facility];  Service: Urology;  Laterality: N/A;   OPERATIVE ULTRASOUND N/A 05/22/2019   Procedure: OPERATIVE ULTRASOUND;  Surgeon: Antony Blackbird, MD;  Location: [facility];  Service: Urology;  Laterality: N/A;   OPERATIVE ULTRASOUND N/A 05/29/2019   Procedure: OPERATIVE ULTRASOUND;  Surgeon: Antony Blackbird, MD;  Location: [facility];  Service: Urology;  Laterality: N/A;   OPERATIVE ULTRASOUND N/A 06/05/2019   Procedure: OPERATIVE ULTRASOUND;  Surgeon: Antony Blackbird, MD;  Location: [facility];  Service: Urology;  Laterality: N/A;   TANDEM RING INSERTION N/A 05/09/2019   Procedure: TANDEM RING INSERTION;  Surgeon: Antony Blackbird, MD;  Location: [facility];  Service: Urology;  Laterality: N/A;   TANDEM RING INSERTION N/A 05/15/2019   Procedure: TANDEM RING INSERTION;  Surgeon: Antony Blackbird, MD;  Location: [facility]  Service: Urology;  Laterality: N/A;   TANDEM RING INSERTION N/A 05/22/2019   Procedure: TANDEM RING INSERTION;  Surgeon: Antony Blackbird, MD;  Location: [facility];  Service: Urology;  Laterality: N/A;   TANDEM RING INSERTION N/A 05/29/2019   Procedure: TANDEM RING INSERTION;  Surgeon: Antony Blackbird, MD;  Location: [facility];  Service: Urology;  Laterality: N/A;   TANDEM RING INSERTION N/A 06/05/2019   Procedure: TANDEM RING INSERTION;  Surgeon: Antony Blackbird, MD;  Location: [facility];  Service: Urology;  Laterality: N/A;    Family History: Family History  Problem Relation Age of Onset   Brain cancer Mother        lung cancer to brain   Hypertension Mother    Cancer Mother     Depression Mother    Anxiety disorder Mother    Diabetes Father    Kidney disease Father    Cancer Father        kidney cancer   Heart failure Sister     Social History:  Lives in a unknown year house Flooring in bedroom: carpet Pets: dog Tobacco use/exposure: none  Medication List:  Allergies as of 08/11/2022       Reactions   Penicillins    Not sure a child allergy   Shellfish Allergy Swelling   Seafood also any kind   Sulfa Antibiotics    Not sure a child allergy        Medication List        Accurate as of August 11, 2022  3:59 PM. If you have any questions, ask your nurse or doctor.          STOP taking  these medications    fluticasone 50 MCG/ACT nasal spray Commonly known as: FLONASE Stopped by: Birder Robson, MD       TAKE these medications    Accu-Chek Guide test strip Generic drug: glucose blood Use to check blood sugar TID.   Accu-Chek Guide w/Device Kit Use to check blood sugar TID.   Accu-Chek Softclix Lancets lancets Use to check blood sugar TID.   acyclovir ointment 5 % Commonly known as: ZOVIRAX Apply 1 Application topically every 4 (four) hours. For 7 days, apply to skin lesion on left buttock   amLODipine-valsartan 10-320 MG tablet Commonly known as: EXFORGE Take 1 tablet by mouth daily.   chlorthalidone 25 MG tablet Commonly known as: HYGROTON Take 1 tablet (25 mg total) by mouth daily.   Cholecalciferol 50 MCG (2000 UT) Tabs Take by mouth.   levocetirizine 5 MG tablet Commonly known as: XYZAL Take 1 tablet (5 mg total) by mouth every evening.   neomycin-polymyxin b-dexamethasone 3.5-10000-0.1 Susp Commonly known as: MAXITROL SMARTSIG:In Eye(s)   Restasis 0.05 % ophthalmic emulsion Generic drug: cycloSPORINE 1 drop 2 (two) times daily.   Semaglutide (1 MG/DOSE) 4 MG/3ML Sopn Inject 1 mg as directed once a week.   sertraline 100 MG tablet Commonly known as: ZOLOFT Take 100 mg by mouth daily.          REVIEW OF SYSTEMS: Pertinent positives and negatives discussed in HPI.   Objective:   Physical Exam: BP 134/82   Pulse 91   Temp 98.5 F (36.9 C)   Resp 18   Ht 5\' 2"  (1.575 m)   Wt 230 lb (104.3 kg)   SpO2 95%   BMI 42.07 kg/m  Body mass index is 42.07 kg/m. GEN: alert, well developed HEENT: slight redness of bl eyes, TM grey and translucent, nose with + inferior turbinate hypertrophy, pink nasal mucosa, slight clear rhinorrhea, + cobblestoning HEART: regular rate and rhythm, no murmur LUNGS: clear to auscultation bilaterally, no coughing, unlabored respiration ABDOMEN: soft, non distended  SKIN: no rashes or lesions  Reviewed:  08/06/2022: seen by Charm Barges NP for issues with eyes, thought to be allergies but also having dry eyes.  Followed by Optho. Referral to Allergy.    07/12/2022: seen by Jed Limerick NP obgyn; has hx of SCC of cervix s/p chemo and radiation. Notes negative Pap and HPV testing 06/2022.   05/21/2022: followed by Dr. Morene Crocker for OSA. Discussed compliance with CPAP and trial of a different mask.   Skin Testing:  Skin prick testing was placed, which includes aeroallergens/foods, histamine control, and saline control.  Verbal consent was obtained prior to placing test.  Patient tolerated procedure well.  Allergy testing results were read and interpreted by myself, documented by clinical staff. Adequate positive and negative control.  Results discussed with patient/family.  Airborne Adult Perc - 08/11/22 1530     Time Antigen Placed 1531    Allergen Manufacturer Waynette Buttery    Location Back    Number of Test 55    1. Control-Buffer 50% Glycerol Negative    2. Control-Histamine 3+    3. Bahia 3+    4. French Southern [place] Negative    5. Kludt Negative    6. Kentucky Blue 3+    7. Meadow Fescue 3+    8. Perennial [name] 3+    9. Timothy 3+    10. Ragweed Mix Negative    11. Cocklebur Negative    12. Plantain,  English Negative    13. Baccharis Negative  14. Dog Fennel  Negative    15. Russian Thistle Negative    16. Lamb's Quarters Negative    17. Sheep Sorrell Negative    18. Rough Pigweed Negative    19. Marsh Elder, Rough Negative    20. Mugwort, Common Negative    21. Box, Elder Negative    22. Cedar, red Negative    23. Sweet Gum Negative    24. Pecan Pollen Negative    25. Pine Mix Negative    26. Walnut, Black Pollen 3+    27. Red Mulberry Negative    28. Ash Mix Negative    29. Birch Mix Negative    30. Beech American Negative    31. Cottonwood, Guinea-Bissau Negative    32. Hickory, White Negative    33. Maple Mix Negative    34. Oak, Guinea-[place]issau Mix 3+    35. Sycamore Eastern Negative    36. Alternaria Alternata 2+    37. Cladosporium Herbarum 2+    38. Aspergillus Mix 2+    39. Penicillium Mix Negative    40. Bipolaris Sorokiniana (Helminthosporium) Negative    41. Drechslera Spicifera (Curvularia) 2+    42. Mucor Plumbeus Negative    43. Fusarium Moniliforme Negative    44. Aureobasidium Pullulans (pullulara) Negative    45. Rhizopus Oryzae Negative    46. Botrytis Cinera Negative    47. Epicoccum Nigrum Negative    48. Phoma Betae Negative    49. Dust Mite Mix 3+    50. Cat Hair 10,000 BAU/ml 3+    51.  Dog Epithelia 2+    52. Mixed Feathers Negative    53. Horse Epithelia 3+    54. Cockroach, German Negative    55. Tobacco Leaf Negative             Food Adult Perc - 08/11/22 1500     Time Antigen Placed 1531    Allergen Manufacturer Waynette Buttery    Location Back    Number of allergen test 6     Control-buffer 50% Glycerol Negative    Control-Histamine 3+    8. Shellfish Mix Negative    23. Shrimp Negative    24. Crab --   3X4   25. Lobster --   3X2   26. Oyster Negative    27. Scallops Negative               Assessment:   1. Anaphylactic shock due to shellfish, initial encounter   2. Nasal turbinate hypertrophy   3. Dry eyes   4. Seasonal allergic rhinitis due to pollen   5. Allergic rhinitis caused by mold    6. Allergic rhinitis due to dust mite   7. Allergic rhinitis due to animal hair or dander   8. Allergic conjunctivitis of both eyes     Plan/Recommendations:  Allergic Rhinitis: - Due to turbinate hypertrophy and unresponsive to OTC meds, performed skin testing to identify aeroallergen triggers.  - Positive skin test 08/2022: grasses, trees, molds, cats, dogs, dust mite, horses  - Avoidance measures discussed. - Use nasal saline rinses before nose sprays such as with Neilmed Sinus Rinse.  Use distilled water.   - Use Flonase 2 sprays each nostril daily. Aim upward and outward. - Use Azelastine 1-2 sprays each nostril twice daily as needed for runny nose, congestion, drainage, sneezing. Aim upward and outward. - Use Xyal 5mg  daily.  - Continue follow up with Ophthalmology.  On Restasis.  Her most concerning symptoms  are ocular.  Will not do ocular anti histamine drops as they can worsen dryness.    - Of note, she has a history of cervical cancer requiring chemo/radiation.  Discussed she might not be the best candidate for allergy shots due to history of malignancy.  If her allergy symptoms are severe and uncontrolled despite medication use, we can discuss this further as her malignancy is no longer active so it is not a contraindication.     Food allergy:  - please strictly avoid shellfish. - Initial rxn: hives. - today's skin testing 08/2022 was positive for shellfish.  - for SKIN only reaction, okay to take Benadryl 25mg  capsules every 6 hours - for SKIN + ANY additional symptoms, OR IF concern for LIFE THREATENING reaction = Epipen Autoinjector EpiPen 0.3 mg. - If using Epinephrine autoinjector, call 911 or go the ER.    Penicillin Allergy  - Low risk; unknown reaction that occurred in childhood, possibly rash.  - Will discuss direct oral challenge at next visit.   Return in about 6 weeks (around 09/22/2022).  Alesia Morin, MD [facility]

## 2022-08-12 NOTE — Addendum Note (Signed)
Addended by: Briant Cedar L on: 08/12/2022 04:00 PM   Modules accepted: Orders

## 2022-08-13 ENCOUNTER — Encounter (INDEPENDENT_AMBULATORY_CARE_PROVIDER_SITE_OTHER): Payer: Self-pay | Admitting: Family Medicine

## 2022-08-13 ENCOUNTER — Ambulatory Visit (INDEPENDENT_AMBULATORY_CARE_PROVIDER_SITE_OTHER): Payer: Medicaid Other | Admitting: Family Medicine

## 2022-08-13 VITALS — BP 137/82 | HR 85 | Temp 98.1°F | Ht 62.0 in | Wt 227.0 lb

## 2022-08-13 DIAGNOSIS — I152 Hypertension secondary to endocrine disorders: Secondary | ICD-10-CM | POA: Diagnosis not present

## 2022-08-13 DIAGNOSIS — E1159 Type 2 diabetes mellitus with other circulatory complications: Secondary | ICD-10-CM

## 2022-08-13 DIAGNOSIS — N1831 Chronic kidney disease, stage 3a: Secondary | ICD-10-CM

## 2022-08-13 DIAGNOSIS — E669 Obesity, unspecified: Secondary | ICD-10-CM

## 2022-08-13 DIAGNOSIS — E1122 Type 2 diabetes mellitus with diabetic chronic kidney disease: Secondary | ICD-10-CM | POA: Diagnosis not present

## 2022-08-13 DIAGNOSIS — E1169 Type 2 diabetes mellitus with other specified complication: Secondary | ICD-10-CM

## 2022-08-13 DIAGNOSIS — Z7985 Long-term (current) use of injectable non-insulin antidiabetic drugs: Secondary | ICD-10-CM

## 2022-08-13 DIAGNOSIS — E785 Hyperlipidemia, unspecified: Secondary | ICD-10-CM

## 2022-08-13 DIAGNOSIS — Z6841 Body Mass Index (BMI) 40.0 and over, adult: Secondary | ICD-10-CM

## 2022-08-13 MED ORDER — SEMAGLUTIDE (1 MG/DOSE) 4 MG/3ML ~~LOC~~ SOPN
1.0000 mg | PEN_INJECTOR | SUBCUTANEOUS | 0 refills | Status: DC
Start: 2022-08-13 — End: 2022-09-08

## 2022-08-13 MED ORDER — ATORVASTATIN CALCIUM 20 MG PO TABS
20.0000 mg | ORAL_TABLET | Freq: Every day | ORAL | 0 refills | Status: DC
Start: 2022-08-13 — End: 2022-10-12

## 2022-08-13 NOTE — Progress Notes (Unsigned)
Chief Complaint:   OBESITY Tamara Osborne is here to discuss her progress with her obesity treatment plan along with follow-up of her obesity related diagnoses. [name] is on keeping a food journal and adhering to recommended goals of 1400-1500 calories and 90 grams of protein and states she is following her eating plan approximately 90% of the time. [name] states she is doing 0 minutes 0 times per week.  Today's visit was #: 43 Starting weight: 208 lbs Starting date: 09/20/2019 Today's weight: 227 lbs Today's date: 08/13/2022 Total lbs lost to date: 0 Total lbs lost since last in-office visit: 0  Interim History: Patient has been going to different eye doctors recently to try to figure out what is going on.  She has seen an allergist as well to see if allergies are playing a part in her symptoms.  She feels frustrated because she has not gotten much relief. With everything going on she feels her anxiety has gotten much worse and she restarted zoloft and feels that really makes it difficult to lose weight. She is going for 90g of protein per day and trying to stick between calories of 1500-1600.  She didn't eat on plan the week her eyes were really bothering her.   Subjective:   1. Hyperlipidemia associated with type 2 diabetes mellitus (HCC) Patient is not on medications (statin), and her last LDL was of 138, HDL 41, and triglycerides 161.  LDL goal of below 70.  2. Type 2 diabetes mellitus with stage 3a chronic kidney disease, without long-term current use of insulin (HCC) Patient is on 52 clicks of Ozempic currently.  She was forcing herself to eat on full 1 mg dose.  3. Hypertension associated with type 2 diabetes mellitus (HCC) Patient is on amlodipine-valsartan and chlorthalidone.  Her blood pressure was elevated today on initial check but better controlled on recheck.  She denies chest pain, chest pressure, or headache.  Assessment/Plan:   1. Hyperlipidemia associated with type  2 diabetes mellitus (HCC) Patient agreed to start Lipitor 20 mg once daily, with a 90-day supply.  - atorvastatin (LIPITOR) 20 MG tablet; Take 1 tablet (20 mg total) by mouth daily.  Dispense: 90 tablet; Refill: 0  2. Type 2 diabetes mellitus with stage 3a chronic kidney disease, without long-term current use of insulin (HCC) We will refill Ozempic 1 mg for 1 month.  - Semaglutide, 1 MG/DOSE, 4 MG/3ML SOPN; Inject 1 mg as directed once a week.  Dispense: 3 mL; Refill: 0  3. Hypertension associated with type 2 diabetes mellitus (HCC) We will follow-up on her blood pressure at her next appointment.  Patient is supposed to see Cardiology (Dr. Duke Salvia).  4. Obesity with starting BMI of 39.3  5. BMI 40.0-44.9, adult [facility]) [name] is currently in the action stage of change. As such, her goal is to continue with weight loss efforts. She has agreed to keeping a food journal and adhering to recommended goals of 1450-1500 calories and 90+ grams of protein daily.   Exercise goals: No exercise has been prescribed at this time.  Behavioral modification strategies: increasing lean protein intake, meal planning and cooking strategies, keeping healthy foods in the home, and planning for success.  Tamara Osborne has agreed to follow-up with our clinic in 4 weeks. She was informed of the importance of frequent follow-up visits to maximize her success with intensive lifestyle modifications for her multiple health conditions.   Objective:   Blood pressure 137/82, pulse 85, temperature 98.1 F (36.7  C), height 5\' 2"  (1.575 m), weight 227 lb (103 kg), SpO2 98 %. Body mass index is 41.52 kg/m.  General: Cooperative, alert, well developed, in no acute distress. HEENT: Conjunctivae and lids unremarkable. Cardiovascular: Regular rhythm.  Lungs: Normal work of breathing. Neurologic: No focal deficits.   Lab Results  Component Value Date   CREATININE 1.50 (H) 08/06/2022   BUN 23 08/06/2022   NA 140  08/06/2022   K 4.9 08/06/2022   CL 102 08/06/2022   CO2 20 08/06/2022   Lab Results  Component Value Date   ALT 48 (H) 08/06/2022   AST 27 08/06/2022   ALKPHOS 97 08/06/2022   BILITOT 0.3 08/06/2022   Lab Results  Component Value Date   HGBA1C 5.9 (H) 08/06/2022   HGBA1C 5.3 07/16/2022   HGBA1C 5.4 06/05/2021   HGBA1C 5.2 01/13/2021   HGBA1C 7.5 (H) 10/18/2020   Lab Results  Component Value Date   INSULIN 40.3 (H) 09/20/2019   Lab Results  Component Value Date   TSH 1.850 08/06/2022   Lab Results  Component Value Date   CHOL 209 (H) 08/06/2022   HDL 41 08/06/2022   LDLCALC 138 (H) 08/06/2022   TRIG 168 (H) 08/06/2022   CHOLHDL 5.1 (H) 08/06/2022   Lab Results  Component Value Date   VD25OH 66.26 06/05/2021   VD25OH 59.37 10/18/2020   VD25OH 45.60 05/20/2020   Lab Results  Component Value Date   WBC 9.3 08/06/2022   HGB 15.3 08/06/2022   HCT 45.3 08/06/2022   MCV 83 08/06/2022   PLT 326 08/06/2022   Lab Results  Component Value Date   IRON 17 (L) 03/27/2019   TIBC 283 03/27/2019   FERRITIN 32 03/27/2019   Attestation Statements:   Reviewed by clinician on day of visit: allergies, medications, problem list, medical history, surgical history, family history, social history, and previous encounter notes.   I, Burt Knack, am acting as transcriptionist for Reuben Likes, MD. I have reviewed the above documentation for accuracy and completeness, and I agree with the above. - Reuben Likes, MD

## 2022-09-07 NOTE — Progress Notes (Unsigned)
TeleHealth Visit:  This visit was completed with telemedicine (audio/video) technology. Tamara Osborne has verbally consented to this TeleHealth visit. The patient is located at home, the provider is located at home. The participants in this visit include the listed provider and patient. The visit was conducted today via MyChart video.  OBESITY [name] is here to discuss her progress with her obesity treatment plan along with follow-up of her obesity related diagnoses.   Today's visit was # 44 Starting weight: 208 lbs Starting date: 09/20/2019 Weight at last in office visit: 227 lbs on 08/13/22 Total weight loss: 0 lbs at last in office visit on 08/13/22. Today's reported weight (09/08/22): none reported  Nutrition Plan: keeping a food journal with goal of 1400-1500 calories and 90 grams of protein daily - 100% adherence.  Current exercise:  Silver Sneakers classes at [facility] 3 days per week.  Interim History:  She is struggling with dry eyes and was started on Restasis with some relief. She has less eye pain.  She is using the Lose It! App to journal. She finds it helpful. She is journaling every day. She is meeting protein goals and calories are 1300-1500/day. She has occasional half sweet tea- about once weekly. She is drinking some diet juice (<5 calories) per glass.  She feels she has lost weight since her last office visit. She is going to the beach this weekend. She plans to journal on her trip and stick to plan.   Assessment/Plan:  1. Type 2 Diabetes Mellitus with CKD stage IIIb, without long-term current use of insulin HgbA1c is at goal. Last A1c was 5.9 CBGs:  seldom, 90- low 100s. Episodes of hypoglycemia: no Medication(s): Ozempic 1 mg SQ weekly- taking 54 clicks (0.75 mg). Appetite well controlled. Had stomach pain and no appetite at all with 1 mg dose. Lab Results  Component Value Date   HGBA1C 5.9 (H) 08/06/2022   HGBA1C 5.3 07/16/2022   HGBA1C 5.4 06/05/2021    Lab Results  Component Value Date   LDLCALC 138 (H) 08/06/2022   CREATININE 1.50 (H) 08/06/2022   No results found for: "GFR"  Plan: Continue and refill Ozempic 1 mg SQ weekly- continue 54 clicks (0.75 mg dose).   2. Hypertension with type 2 diabetes Hypertension poorly controlled generally but last 2 blood pressures have been closer to goal. Referred to cardiology for HTN by PCP. Has upcoming appointment August 8 with Dr. Chilton Si.  When she lost down to 180 pounds during cancer treatment  her hypertension resolved. Medication(s): amlodipine-valsartan 10-320 mg once daily, chlorthalidone 25 mg daily.    BP Readings from Last 3 Encounters:  08/13/22 137/82  08/11/22 134/82  08/06/22 (!) 154/92   Lab Results  Component Value Date   CREATININE 1.50 (H) 08/06/2022   CREATININE 1.21 (H) 10/23/2021   CREATININE 1.47 (H) 06/05/2021   No results found for: "GFR"  Plan: Continue all antihypertensives at current dosages. FU with cardiology 10/08/22.  3. Morbid Obesity: Current BMI 41  [name] is not currently in the action stage of change. As such, her goal is to continue with weight loss efforts.  She has agreed to keeping a food journal with goal of 1400-1500 calories and 90 grams of protein daily.  1.  Discussed meal planning for upcoming trip to beach. 2.  Continue to journal consistently and accurately.  Exercise goals:  as is  Behavioral modification strategies: meal planning , planning for success, travel eating strategies, keep a strict food journal, and increase frequency  of journaling.  [name] has agreed to follow-up with our clinic in 5 weeks.  No orders of the defined types were placed in this encounter.   Medications Discontinued During This Encounter  Medication Reason   Semaglutide, 1 MG/DOSE, 4 MG/3ML SOPN Reorder     Meds ordered this encounter  Medications   Semaglutide, 1 MG/DOSE, 4 MG/3ML SOPN    Sig: Inject 1 mg as directed once a  week.    Dispense:  3 mL    Refill:  0    Order Specific Question:   Supervising Provider    Answer:   Glennis Brink [[id number]]      Objective:   VITALS: Per patient if applicable, see vitals. GENERAL: Alert and in no acute distress. CARDIOPULMONARY: No increased WOB. Speaking in clear sentences.  PSYCH: Pleasant and cooperative. Speech normal rate and rhythm. Affect is appropriate. Insight and judgement are appropriate. Attention is focused, linear, and appropriate.  NEURO: Oriented as arrived to appointment on time with no prompting.   Attestation Statements:   Reviewed by clinician on day of visit: allergies, medications, problem list, medical history, surgical history, family history, social history, and previous encounter notes.   This was prepared with the assistance of Engineer, civil (consulting).  Occasional wrong-word or sound-a-like substitutions may have occurred due to the inherent limitations of voice recognition software.

## 2022-09-08 ENCOUNTER — Telehealth (INDEPENDENT_AMBULATORY_CARE_PROVIDER_SITE_OTHER): Payer: Medicaid Other | Admitting: Family Medicine

## 2022-09-08 ENCOUNTER — Encounter (INDEPENDENT_AMBULATORY_CARE_PROVIDER_SITE_OTHER): Payer: Self-pay | Admitting: Family Medicine

## 2022-09-08 DIAGNOSIS — N1832 Chronic kidney disease, stage 3b: Secondary | ICD-10-CM | POA: Diagnosis not present

## 2022-09-08 DIAGNOSIS — E1122 Type 2 diabetes mellitus with diabetic chronic kidney disease: Secondary | ICD-10-CM

## 2022-09-08 DIAGNOSIS — Z7984 Long term (current) use of oral hypoglycemic drugs: Secondary | ICD-10-CM

## 2022-09-08 DIAGNOSIS — Z6841 Body Mass Index (BMI) 40.0 and over, adult: Secondary | ICD-10-CM

## 2022-09-08 DIAGNOSIS — I152 Hypertension secondary to endocrine disorders: Secondary | ICD-10-CM | POA: Diagnosis not present

## 2022-09-08 DIAGNOSIS — E1159 Type 2 diabetes mellitus with other circulatory complications: Secondary | ICD-10-CM

## 2022-09-08 MED ORDER — SEMAGLUTIDE (1 MG/DOSE) 4 MG/3ML ~~LOC~~ SOPN
1.0000 mg | PEN_INJECTOR | SUBCUTANEOUS | 0 refills | Status: DC
Start: 2022-09-08 — End: 2022-10-12

## 2022-09-19 ENCOUNTER — Other Ambulatory Visit (INDEPENDENT_AMBULATORY_CARE_PROVIDER_SITE_OTHER): Payer: Self-pay | Admitting: Family Medicine

## 2022-09-19 DIAGNOSIS — E785 Hyperlipidemia, unspecified: Secondary | ICD-10-CM

## 2022-09-21 ENCOUNTER — Encounter: Payer: Self-pay | Admitting: Internal Medicine

## 2022-09-21 ENCOUNTER — Ambulatory Visit: Payer: Medicaid Other | Admitting: Internal Medicine

## 2022-09-21 ENCOUNTER — Other Ambulatory Visit: Payer: Self-pay

## 2022-09-21 ENCOUNTER — Encounter (HOSPITAL_BASED_OUTPATIENT_CLINIC_OR_DEPARTMENT_OTHER): Payer: Self-pay | Admitting: Family Medicine

## 2022-09-21 VITALS — BP 122/74 | HR 80 | Temp 98.1°F | Resp 18

## 2022-09-21 DIAGNOSIS — J3089 Other allergic rhinitis: Secondary | ICD-10-CM

## 2022-09-21 DIAGNOSIS — J302 Other seasonal allergic rhinitis: Secondary | ICD-10-CM | POA: Diagnosis not present

## 2022-09-21 DIAGNOSIS — Z880 Allergy status to penicillin: Secondary | ICD-10-CM

## 2022-09-21 DIAGNOSIS — T7802XD Anaphylactic reaction due to shellfish (crustaceans), subsequent encounter: Secondary | ICD-10-CM | POA: Diagnosis not present

## 2022-09-21 MED ORDER — FLUTICASONE PROPIONATE 50 MCG/ACT NA SUSP: 2.0000 | Freq: Every day | NASAL | 5 refills | Status: AC

## 2022-09-21 MED ORDER — IPRATROPIUM BROMIDE 0.06 % NA SOLN: 2.0000 | Freq: Three times a day (TID) | NASAL | 5 refills | Status: AC | PRN

## 2022-09-21 MED ORDER — AZELASTINE HCL 0.1 % NA SOLN: 1.0000 | Freq: Two times a day (BID) | NASAL | 5 refills | Status: AC | PRN

## 2022-09-21 MED ORDER — EPINEPHRINE 0.3 MG/0.3ML IJ SOAJ: 0.3000 mg | INTRAMUSCULAR | 1 refills | Status: AC | PRN

## 2022-09-21 MED ORDER — LEVOCETIRIZINE DIHYDROCHLORIDE 5 MG PO TABS: 5.0000 mg | ORAL_TABLET | Freq: Every evening | ORAL | 1 refills | Status: DC

## 2022-09-21 NOTE — Patient Instructions (Addendum)
Allergic Rhinitis: - Positive skin test 08/2022: grasses, trees, molds, cats, dogs, dust mite, horses  - Avoidance measures discussed. - Use nasal saline rinses before nose sprays such as with Neilmed Sinus Rinse.  Use distilled water.   - Use Flonase 2 sprays each nostril daily. Aim upward and outward. - Use Azelastine 1-2 sprays each nostril twice daily as needed for runny nose, congestion, drainage, sneezing. Aim upward and outward. - Use Ipratropium 1-2 each nostril three times a day as needed for post nasal drainage.  Aim upward and outward.   - Use Xyal 5mg  daily.  - Continue follow up with Ophthalmology.  On Restasis.   - Of note, she has a history of cervical cancer requiring chemo/radiation.  Discussed she might not be the best candidate for allergy shots due to history of malignancy.  If her allergy symptoms are severe and uncontrolled despite medication use, we can discuss this further as her malignancy is no longer active so it is not a contraindication.    Food allergy:  - please strictly avoid shellfish. - SPT 08/2022: positive for shellfish.  - Initial rxn: hives - for SKIN only reaction, okay to take Benadryl 25mg  capsules every 6 hours - for SKIN + ANY additional symptoms, OR IF concern for LIFE THREATENING reaction = Epipen Autoinjector EpiPen 0.3 mg. - If using Epinephrine autoinjector, call 911 or go the ER.    Penicillin Allergy  - Low risk; unknown reaction that occurred in childhood, possibly rash.  - Call us back to schedule this when you a chance; it will be for a direct amoxicillin challenge.   Hold all anti histamines (Xyzal) 3 days prior.

## 2022-09-21 NOTE — Progress Notes (Signed)
FOLLOW UP Date of Service/Encounter:  09/21/22   Subjective:  [name] (DOB: [birth year]) is a 47 y.o. female who [facility] for follow up for allergic rhinoconjunctivitis and shellfish allergy.   History obtained from: chart review and patient.  Last visit was on 08/11/2022 with me.  SPT positive to multiple aeroallergens, started on Flonase, Azelastine, Xyzal.  Prior history of cervical cancer.  Food Allergy SPT positive to shellfish, discussed avoidance and Epipen.   Rhinoconjunctivitis: Reports doing better since last visit.  She has not had as much congestion, runny nose, sneezing, eye symptoms.  On Restasis with Optho which is helping too. Still having post nasal drainage that occurs randomly but usually mid day.  Thinks it is related to her sleep apnea mask use.    Shellfish Allergy: No accidental exposure. Has Epipen.    Penicillin Allergy: Avoiding all penicillin class family but thinks she has taken something from its class and done fine.  No records of this.  Interested in challenge but needs to figure out her schedule firs.t   Past Medical History: Past Medical History:  Diagnosis Date   Anemia    Angio-edema    Anxiety    Bartholin cyst    Cervical cancer (HCC)    Chronic kidney disease    Constipation    Depression    Deviated septum    Diabetes mellitus without complication (HCC)    Difficult intravenous access    used port for 05-09-2019 surgery   Fibroids    History of blood transfusion    2 units given 04-26-2019, has had total of 8 units since jan 2021   History of blood transfusion 1 unit given 05-30-19   iv fluids also given   History of cervical cancer 07/16/2022   History of kidney problems    History of radiation therapy 03/23/2019-05/04/2019   Cervical external beam   Dr Antony Blackbird   History of radiation therapy 05/09/2019-06/05/2019   vaginal brachytherapy   Dr Antony Blackbird   History of recent blood  transfusion 05/16/2019   2 units given  per dr Lambert Mody at cancer center   Hypertension    Neuropathy    both thumbs   Obesity    Palpitations    PONV (postoperative nausea and vomiting)    Prediabetes    Preeclampsia 2006   Sleep apnea    no cpap used insurance would not cover, osa severe per pt   SOB (shortness of breath)    Squamous cell carcinoma of cervix (HCC) 03/23/2019   Swallowing difficulty    Symptomatic skin lesion 07/14/2022   Vitamin D deficiency 01/10/2020    Objective:  BP 122/74 (BP Location: Left Arm, Patient Position: Sitting, Cuff Size: Large)   Pulse 80   Temp 98.1 F (36.7 C) (Temporal)   Resp 18   SpO2 97%  There is no height or weight on file to calculate BMI. Physical Exam: GEN: alert, well developed HEENT: clear conjunctiva, TM grey and translucent, nose with mild inferior turbinate hypertrophy, pink nasal mucosa, no rhinorrhea, + cobblestoning HEART: regular rate and rhythm, no murmur LUNGS: clear to auscultation bilaterally, no coughing, unlabored respiration SKIN: no rashes or lesions  Assessment:   1. Seasonal and perennial allergic rhinitis   2. Anaphylactic shock due to shellfish, subsequent encounter   3. Penicillin allergy     Plan/Recommendations:  Allergic Rhinitis: - Uncontrolled, will add Ipratropium due to significant PND.  -  Positive skin test 08/2022: grasses, trees, molds, cats, dogs, dust mite, horses  - Avoidance measures discussed. - Use nasal saline rinses before nose sprays such as with Neilmed Sinus Rinse.  Use distilled water.   - Use Flonase 2 sprays each nostril daily. Aim upward and outward. - Use Azelastine 1-2 sprays each nostril twice daily as needed for runny nose, congestion, drainage, sneezing. Aim upward and outward. - Use Ipratropium 1-2 each nostril three times a day as needed for post nasal drainage.  Aim upward and outward.   - Use Xyal 5mg  daily.  - Continue follow up with Ophthalmology.  On Restasis.    - Of note, she has a history of cervical cancer requiring chemo/radiation.  Discussed she might not be the best candidate for allergy shots due to history of malignancy.  If her allergy symptoms are severe and uncontrolled despite medication use, we can discuss this further as her malignancy is no longer active so it is not a contraindication.    Food allergy:  - please strictly avoid shellfish. - SPT 08/2022: positive for shellfish.  - Initial rxn: hives - for SKIN only reaction, okay to take Benadryl 25mg  capsules every 6 hours - for SKIN + ANY additional symptoms, OR IF concern for LIFE THREATENING reaction = Epipen Autoinjector EpiPen 0.3 mg. - If using Epinephrine autoinjector, call 911 or go the ER.    Penicillin Allergy  - Low risk; unknown reaction that occurred in childhood, possibly rash.  - Call us back to schedule this when you a chance; it will be for a direct amoxicillin challenge.   Hold all anti histamines (Xyzal) 3 days prior.      Return in about 4 months (around 01/22/2023).  Alesia Morin, MD [facility]

## 2022-09-22 ENCOUNTER — Ambulatory Visit: Payer: Medicaid Other | Admitting: Allergy & Immunology

## 2022-10-08 ENCOUNTER — Ambulatory Visit (HOSPITAL_BASED_OUTPATIENT_CLINIC_OR_DEPARTMENT_OTHER): Payer: Medicaid Other | Admitting: Cardiovascular Disease

## 2022-10-08 ENCOUNTER — Telehealth (INDEPENDENT_AMBULATORY_CARE_PROVIDER_SITE_OTHER): Payer: Self-pay | Admitting: Family Medicine

## 2022-10-08 ENCOUNTER — Encounter (HOSPITAL_BASED_OUTPATIENT_CLINIC_OR_DEPARTMENT_OTHER): Payer: Self-pay | Admitting: Cardiovascular Disease

## 2022-10-08 VITALS — BP 154/86 | HR 101 | Ht 62.0 in | Wt 233.2 lb

## 2022-10-08 DIAGNOSIS — G4733 Obstructive sleep apnea (adult) (pediatric): Secondary | ICD-10-CM | POA: Diagnosis not present

## 2022-10-08 DIAGNOSIS — E1169 Type 2 diabetes mellitus with other specified complication: Secondary | ICD-10-CM

## 2022-10-08 DIAGNOSIS — R2689 Other abnormalities of gait and mobility: Secondary | ICD-10-CM

## 2022-10-08 DIAGNOSIS — I152 Hypertension secondary to endocrine disorders: Secondary | ICD-10-CM | POA: Diagnosis not present

## 2022-10-08 DIAGNOSIS — E785 Hyperlipidemia, unspecified: Secondary | ICD-10-CM

## 2022-10-08 DIAGNOSIS — Z006 Encounter for examination for normal comparison and control in clinical research program: Secondary | ICD-10-CM

## 2022-10-08 DIAGNOSIS — E1159 Type 2 diabetes mellitus with other circulatory complications: Secondary | ICD-10-CM

## 2022-10-08 DIAGNOSIS — N183 Chronic kidney disease, stage 3 unspecified: Secondary | ICD-10-CM

## 2022-10-08 DIAGNOSIS — G629 Polyneuropathy, unspecified: Secondary | ICD-10-CM

## 2022-10-08 MED ORDER — DEXAMETHASONE 1 MG PO TABS: 1.0000 mg | ORAL_TABLET | Freq: Once | ORAL | 0 refills | Status: AC

## 2022-10-08 MED ORDER — CHLORTHALIDONE 25 MG PO TABS: 25.0000 mg | ORAL_TABLET | Freq: Every day | ORAL | 3 refills | Status: DC

## 2022-10-08 NOTE — Patient Instructions (Addendum)
Medication Instructions:  TAKE DEXAMETHASONE 1 MG AT 10 PM NIGHT BEFORE YOU GO FOR LABS  Labwork: 24 HOUR URINE WHICH NEEDS TO BE COMPLETED BEFORE YOU TAKE THE DEXAMETHASONE   RENIN/ALDOSTERONE/CORTISOL LEVEL WHICH NEEDS TO BE DONE AT 8:00 AM MORNING AFTER YOU TAKE THE DEXAMETHASONE   Testing/Procedures: Your physician has requested that you have a renal artery duplex. During this test, an ultrasound is used to evaluate blood flow to the kidneys. Allow one hour for this exam. Do not eat after midnight the day before and avoid carbonated beverages. Take your medications as you usually do.  Follow-Up: 12/10/2022 10:30 am with Ronn Melena NP   02/10/2023 11:00  am with Dr Duke Salvia   Any Other Special Instructions Will Be Listed Below (If Applicable). MONITOR YOUR BLOOD PRESSURE TWICE A DAY, MAKE SURE YOU ARE LOGGED INTO YOUR VIVIFY APP WHEN CHECKING.   Check with [facility] to see if they received a referral, IF NOT FOLLOW UP WITH YOUR PRIMARY CARE

## 2022-10-08 NOTE — Progress Notes (Deleted)
Advanced Hypertension Clinic Initial Assessment:    Date:  10/08/2022   ID:  Tamara Osborne, DOB Mar 29, 1975, MRN [id number]  PCP:  Alyson Reedy, FNP  Cardiologist:  None  Nephrologist:  Referring MD: Alyson Reedy, FNP   CC: Hypertension  History of Present Illness:    Tamara Osborne is a 47 y.o. female with a hx of hypertension, hyperlipidemia, OSA, diabetes, and morbid obesity here to establish care in the [facility].  She will struggle to control her blood pressure.  She is referred by her PCP, Arby Barrette, FNP.  She was seen 08/06/2022 and blood pressure was 154/92 on amlodipine, valsartan, and chlorthalidone.  She was referred to the [facility] hypertension clinic and nephrology.   She has been working with a healthy weight and wellness clinic.  Prior echo in 2021 revealed LVEF 60-65% with moderate LVH and grade 1 diastolic dysfunction.  Previous antihypertensives:  Secondary Causes of Hypertension  Medications/Herbal: OCP, steroids, stimulants, antidepressants, weight loss medication, immune suppressants, NSAIDs, sympathomimetics, alcohol, caffeine, licorice, ginseng, St. John's wort, chemo  Sleep Apnea Renal artery stenosis Hyperaldosteronism Hyper/hypothyroidism Pheochromocytoma: palpitations, tachycardia, headache, diaphoresis (plasma metanephrines) Cushing's syndrome: Cushingoid facies, central obesity, proximal muscle weakness, and ecchymoses, adrenal incidentaloma (cortisol) Coarctation of the aorta  Past Medical History:  Diagnosis Date   Anemia    Angio-edema    Anxiety    Bartholin cyst    Cervical cancer (HCC)    Chronic kidney disease    Constipation    Depression    Deviated septum    Diabetes mellitus without complication (HCC)    Difficult intravenous access    used port for 05-09-2019 surgery   Fibroids    History of blood transfusion    2 units given 04-26-2019, has had total of 8 units since jan 2021   History of  blood transfusion 1 unit given 05-30-19   iv fluids also given   History of cervical cancer 07/16/2022   History of kidney problems    History of radiation therapy 03/23/2019-05/04/2019   Cervical external beam   Dr Antony Blackbird   History of radiation therapy 05/09/2019-06/05/2019   vaginal brachytherapy   Dr Antony Blackbird   History of recent blood transfusion 05/16/2019   2 units given  per dr Lambert Mody at cancer center   Hypertension    Neuropathy    both thumbs   Obesity    Palpitations    PONV (postoperative nausea and vomiting)    Prediabetes    Preeclampsia 2006   Sleep apnea    no cpap used insurance would not cover, osa severe per pt   SOB (shortness of breath)    Squamous cell carcinoma of cervix (HCC) 03/23/2019   Swallowing difficulty    Symptomatic skin lesion 07/14/2022   Vitamin D deficiency 01/10/2020    Past Surgical History:  Procedure Laterality Date   CESAREAN SECTION WITH BILATERAL TUBAL LIGATION  11/10/2004       DILATION AND CURETTAGE OF UTERUS  1997   IR IMAGING GUIDED PORT INSERTION  04/04/2019   IR REMOVAL TUN ACCESS W/ PORT W/O FL MOD SED  10/27/2021   OPERATIVE ULTRASOUND N/A 05/09/2019   Procedure: OPERATIVE ULTRASOUND;  Surgeon: Antony Blackbird, MD;  Location: [facility];  Service: Urology;  Laterality: N/A;   OPERATIVE ULTRASOUND N/A 05/15/2019   Procedure: OPERATIVE ULTRASOUND;  Surgeon: Antony Blackbird, MD;  Location: [facility];  Service: Urology;  Laterality: N/A;   OPERATIVE ULTRASOUND  N/A 05/22/2019   Procedure: OPERATIVE ULTRASOUND;  Surgeon: Antony Blackbird, MD;  Location: [facility];  Service: Urology;  Laterality: N/A;   OPERATIVE ULTRASOUND N/A 05/29/2019   Procedure: OPERATIVE ULTRASOUND;  Surgeon: Antony Blackbird, MD;  Location: [facility];  Service: Urology;  Laterality: N/A;   OPERATIVE ULTRASOUND N/A 06/05/2019   Procedure: OPERATIVE ULTRASOUND;  Surgeon: Antony Blackbird, MD;  Location: [facility];  Service: Urology;  Laterality: N/A;   TANDEM RING INSERTION N/A 05/09/2019   Procedure: TANDEM RING INSERTION;  Surgeon: Antony Blackbird, MD;  Location: [facility];  Service: Urology;  Laterality: N/A;   TANDEM RING INSERTION N/A 05/15/2019   Procedure: TANDEM RING INSERTION;  Surgeon: Antony Blackbird, MD;  Location: [facility];  Service: Urology;  Laterality: N/A;   TANDEM RING INSERTION N/A 05/22/2019   Procedure: TANDEM RING INSERTION;  Surgeon: Antony Blackbird, MD;  Location: [facility];  Service: Urology;  Laterality: N/A;   TANDEM RING INSERTION N/A 05/29/2019   Procedure: TANDEM RING INSERTION;  Surgeon: Antony Blackbird, MD;  Location: [facility];  Service: Urology;  Laterality: N/A;   TANDEM RING INSERTION N/A 06/05/2019   Procedure: TANDEM RING INSERTION;  Surgeon: Antony Blackbird, MD;  Location: [place];  Service: Urology;  Laterality: N/A;    Current Medications: No outpatient medications have been marked as taking for the 10/08/22 encounter (Appointment) with Chilton Si, MD.     Allergies:   Penicillins, Shellfish allergy, and Sulfa antibiotics   Social History   Socioeconomic History   Marital status: Single    Spouse name: Not on file   Number of children: 4   Years of education: Not on file   Highest education level: Not on file  Occupational History   Not on file  Tobacco Use   Smoking status: Never   Smokeless tobacco: Never  Vaping Use   Vaping status: Never Used  Substance and Sexual Activity   Alcohol use: No   Drug use: No   Sexual activity: Not Currently    Birth control/protection: Surgical  Other Topics Concern   Not on file  Social History Narrative   Lives with her boyfriend   Social Determinants of Health   Financial Resource Strain: Not on file  Food Insecurity: Not on file  Transportation Needs: Not on file  Physical Activity: Not on file  Stress:  Not on file  Social Connections: Not on file     Family History: The patient's ***family history includes Anxiety disorder in her mother; Brain cancer in her mother; Cancer in her father and mother; Depression in her mother; Diabetes in her father; Heart failure in her sister; Hypertension in her mother; Kidney disease in her father.  ROS:   Please see the history of present illness.    *** All other systems reviewed and are negative.  EKGs/Labs/Other Studies Reviewed:    EKG:  EKG is *** ordered today.  The ekg ordered today demonstrates ***  Recent Labs: 08/06/2022: ALT 48; BUN 23; Creatinine, Ser 1.50; Hemoglobin 15.3; Platelets 326; Potassium 4.9; Sodium 140; TSH 1.850   Recent Lipid Panel    Component Value Date/Time   CHOL 209 (H) 08/06/2022 1158   TRIG 168 (H) 08/06/2022 1158   HDL 41 08/06/2022 1158   CHOLHDL 5.1 (H) 08/06/2022 1158   CHOLHDL 6.0 06/05/2021 1132   VLDL 26 06/05/2021 1132   LDLCALC 138 (H) 08/06/2022 1158  Physical Exam:   VS:  There were no vitals taken for this visit. , BMI There is no height or weight on file to calculate BMI. GENERAL:  Well appearing HEENT: Pupils equal round and reactive, fundi not visualized, oral mucosa unremarkable NECK:  No jugular venous distention, waveform within normal limits, carotid upstroke brisk and symmetric, no bruits, no thyromegaly LYMPHATICS:  No cervical adenopathy LUNGS:  Clear to auscultation bilaterally HEART:  RRR.  PMI not displaced or sustained,S1 and S2 within normal limits, no S3, no S4, no clicks, no rubs, *** murmurs ABD:  Flat, positive bowel sounds normal in frequency in pitch, no bruits, no rebound, no guarding, no midline pulsatile mass, no hepatomegaly, no splenomegaly EXT:  2 plus pulses throughout, no edema, no cyanosis no clubbing SKIN:  No rashes no nodules NEURO:  Cranial nerves II through XII grossly intact, motor grossly intact throughout PSYCH:  Cognitively intact, oriented to person place  and time   ASSESSMENT/PLAN:    No problem-specific Assessment & Plan notes found for this encounter.   Screening for Secondary Hypertension: { Click here to document screening for secondary causes of HTN  :[id number]}    Relevant Labs/Studies:    Latest Ref Rng & Units 08/06/2022   11:58 AM 10/23/2021   12:18 PM 06/05/2021   11:32 AM  Basic Labs  Sodium 134 - 144 mmol/L 140  138  137   Potassium 3.5 - 5.2 mmol/L 4.9  4.0  4.4   Creatinine 0.57 - 1.00 mg/dL 7.25  3.66  4.40        Latest Ref Rng & Units 08/06/2022   11:58 AM 09/20/2019   11:06 AM  Thyroid   TSH 0.450 - 4.500 uIU/mL 1.850  1.910       Disposition:    FU with MD/PharmD in {gen number [id number]} {Days to years:10300}    Medication Adjustments/Labs and Tests Ordered: Current medicines are reviewed at length with the patient today.  Concerns regarding medicines are outlined above.  No orders of the defined types were placed in this encounter.  No orders of the defined types were placed in this encounter.    Signed, Chilton Si, MD  10/08/2022 8:01 AM    [facility]

## 2022-10-08 NOTE — Research (Signed)
  Subject Name: Tamara Osborne met inclusion and exclusion criteria for the Virtual Care and Social Determinant Interventions for the management of hypertension trial.  The informed consent form, study requirements and expectations were reviewed with the subject by Dr. Duke Salvia and myself. The subject was given the opportunity to read the consent and ask questions. The subject verbalized understanding of the trial requirements.  All questions were addressed prior to the signing of the consent form. The subject agreed to participate in the trial and signed the informed consent. The informed consent was obtained prior to performance of any protocol-specific procedures for the subject.  A copy of the signed informed consent was given to the subject and a copy was placed in the subject's medical record.  Tamara Osborne was randomized to Group 2.

## 2022-10-08 NOTE — Telephone Encounter (Signed)
I reached out to the patient about her upcoming appointment on 11/09/2022 with Dawn. As Dawn is no longer available, we'll need to find an alternative time for the appointment. I have left a voicemail for the patient and look forward to hearing back from her soon.

## 2022-10-08 NOTE — Progress Notes (Signed)
Advanced Hypertension Clinic Initial Assessment:    Date:  10/08/2022   ID:  Tamara Osborne, DOB 1975/07/12, MRN [id number]  PCP:  Alyson Reedy, FNP  Cardiologist:  None  Nephrologist:  Referring MD: Alyson Reedy, FNP   CC: Hypertension  History of Present Illness:    Tamara Osborne is a 47 y.o. female with a hx of hypertension, preeclampsia, hyperlipidemia, OSA, diabetes, squamous cell carcinoma of cervix s/p radiation and chemotherapy, morbid obesity, here to establish care in the Advanced Hypertension Clinic.  She will struggle to control her blood pressure.  She is referred by her PCP, Arby Barrette, FNP.  She was seen 08/06/2022 and blood pressure was 154/92 on amlodipine, valsartan, and chlorthalidone.  She was referred to the Advanced Hypertension clinic and nephrology. She has been working with the healthy weight and wellness clinic. Prior echo in 2021 revealed LVEF 60-65% with moderate LVH and grade 1 diastolic dysfunction.  Today, she confirms struggling with hypertension since her early 20's in the setting of pregnancy and she has had a history of preeclampsia. She has tried multiple agents. She believes that her current regimen seems to be the best that she has tried. Home blood pressures have been averaging closer to 120-121/78-79. She hasn't needed to take an emergency clonidine dose in over 6 months (previously needed several times weekly for 7+ years sometimes for blood pressures in the 200s). In the office her blood pressure is 154/93 initially, and 154/86 on manual recheck. She endorses a component of white coat hypertension and she took her antihypertensives about 1 hour ago. She also ran out of chlorthalidone more than 1 month ago. She has ongoing issues with RLE numbness and achiness. She had been advised to stop her statin by another provider. Her achiness has improved. For exercise she was previously in aquatic therapy, now participates in Silver sneakers. She  states that every 6 months she has recurring issues with capsulitis/paronychia in her feet which has affected her physical activity. She follows with her podiatrist. Regarding her diet she doesn't add any salt, and instead uses salt-free alternative seasonings. Occasionally drinks coffee on the weekends. She is struggling with weight loss. She denies any palpitations, chest pain, shortness of breath, peripheral edema, lightheadedness, headaches, syncope, orthopnea, or PND.  Previous antihypertensives: Multiple intolerances per her report Lisinopril - skin crawling sensations  Past Medical History:  Diagnosis Date   Anemia    Angio-edema    Anxiety    Bartholin cyst    Cervical cancer (HCC)    Chronic kidney disease    Constipation    Depression    Deviated septum    Diabetes mellitus without complication (HCC)    Difficult intravenous access    used port for 05-09-2019 surgery   Fibroids    History of blood transfusion    2 units given 04-26-2019, has had total of 8 units since jan 2021   History of blood transfusion 1 unit given 05-30-19   iv fluids also given   History of cervical cancer 07/16/2022   History of kidney problems    History of radiation therapy 03/23/2019-05/04/2019   Cervical external beam   Dr Antony Blackbird   History of radiation therapy 05/09/2019-06/05/2019   vaginal brachytherapy   Dr Antony Blackbird   History of recent blood transfusion 05/16/2019   2 units given  per dr Lambert Mody at cancer center   Hypertension    Neuropathy    both thumbs   Obesity  Palpitations    PONV (postoperative nausea and vomiting)    Prediabetes    Preeclampsia 2006   Sleep apnea    no cpap used insurance would not cover, osa severe per pt   SOB (shortness of breath)    Squamous cell carcinoma of cervix (HCC) 03/23/2019   Swallowing difficulty    Symptomatic skin lesion 07/14/2022   Vitamin D deficiency 01/10/2020    Past Surgical History:  Procedure Laterality Date   CESAREAN  SECTION WITH BILATERAL TUBAL LIGATION  11/10/2004       DILATION AND CURETTAGE OF UTERUS  1997   IR IMAGING GUIDED PORT INSERTION  04/04/2019   IR REMOVAL TUN ACCESS W/ PORT W/O FL MOD SED  10/27/2021   OPERATIVE ULTRASOUND N/A 05/09/2019   Procedure: OPERATIVE ULTRASOUND;  Surgeon: Antony Blackbird, MD;  Location: [facility];  Service: Urology;  Laterality: N/A;   OPERATIVE ULTRASOUND N/A 05/15/2019   Procedure: OPERATIVE ULTRASOUND;  Surgeon: Antony Blackbird, MD;  Location: [facility];  Service: Urology;  Laterality: N/A;   OPERATIVE ULTRASOUND N/A 05/22/2019   Procedure: OPERATIVE ULTRASOUND;  Surgeon: Antony Blackbird, MD;  Location: [facility];  Service: Urology;  Laterality: N/A;   OPERATIVE ULTRASOUND N/A 05/29/2019   Procedure: OPERATIVE ULTRASOUND;  Surgeon: Antony Blackbird, MD;  Location: [facility];  Service: Urology;  Laterality: N/A;   OPERATIVE ULTRASOUND N/A 06/05/2019   Procedure: OPERATIVE ULTRASOUND;  Surgeon: Antony Blackbird, MD;  Location: [facility];  Service: Urology;  Laterality: N/A;   TANDEM RING INSERTION N/A 05/09/2019   Procedure: TANDEM RING INSERTION;  Surgeon: Antony Blackbird, MD;  Location: [facility];  Service: Urology;  Laterality: N/A;   TANDEM RING INSERTION N/A 05/15/2019   Procedure: TANDEM RING INSERTION;  Surgeon: Antony Blackbird, MD;  Location: [facility];  Service: Urology;  Laterality: N/A;   TANDEM RING INSERTION N/A 05/22/2019   Procedure: TANDEM RING INSERTION;  Surgeon: Antony Blackbird, MD;  Location: [facility];  Service: Urology;  Laterality: N/A;   TANDEM RING INSERTION N/A 05/29/2019   Procedure: TANDEM RING INSERTION;  Surgeon: Antony Blackbird, MD;  Location: [facility];  Service: Urology;  Laterality: N/A;   TANDEM RING INSERTION N/A 06/05/2019   Procedure: TANDEM RING INSERTION;  Surgeon: Antony Blackbird, MD;  Location[facility];  Service: Urology;  Laterality: N/A;    Current Medications: Current Meds  Medication Sig   Accu-Chek Softclix Lancets lancets Use to check blood sugar TID.   amLODipine-valsartan (EXFORGE) 10-320 MG tablet Take 1 tablet by mouth daily.   azelastine (ASTELIN) 0.1 % nasal spray Place 1 spray into both nostrils 2 (two) times daily as needed. Use in each nostril as directed   Blood Glucose Monitoring Suppl (ACCU-CHEK GUIDE) w/Device KIT Use to check blood sugar TID.   Cholecalciferol 50 MCG (2000 UT) TABS Take by mouth.   dexamethasone (DECADRON) 1 MG tablet Take 1 tablet (1 mg total) by mouth once for 1 dose. At 10 pm.   Return for labs the following morning PRIOR to 9AM   EPINEPHrine (EPIPEN 2-PAK) 0.3 mg/0.3 mL IJ SOAJ injection Inject 0.3 mg into the muscle as needed for anaphylaxis.   fluticasone (FLONASE) 50 MCG/ACT nasal spray Place 2 sprays into both nostrils daily.   glucose blood (ACCU-CHEK GUIDE) test strip Use to check blood sugar TID.   ipratropium (ATROVENT) 0.06 % nasal spray Place 2 sprays into  both nostrils 3 (three) times daily as needed for rhinitis (drainage).   levocetirizine (XYZAL) 5 MG tablet Take 1 tablet (5 mg total) by mouth every evening.   neomycin-polymyxin b-dexamethasone (MAXITROL) 3.5-10000-0.1 SUSP SMARTSIG:In Eye(s)   RESTASIS 0.05 % ophthalmic emulsion 1 drop 2 (two) times daily.   Semaglutide, 1 MG/DOSE, 4 MG/3ML SOPN Inject 1 mg as directed once a week.   sertraline (ZOLOFT) 100 MG tablet Take 100 mg by mouth daily.   [DISCONTINUED] chlorthalidone (HYGROTON) 25 MG tablet Take 1 tablet (25 mg total) by mouth daily.     Allergies:   Penicillins, Shellfish allergy, and Sulfa antibiotics   Social History   Socioeconomic History   Marital status: Single    Spouse name: Not on file   Number of children: 4   Years of education: Not on file   Highest education level: Not on file  Occupational History   Not on file  Tobacco Use   Smoking  status: Never   Smokeless tobacco: Never  Vaping Use   Vaping status: Never Used  Substance and Sexual Activity   Alcohol use: No   Drug use: No   Sexual activity: Not Currently    Birth control/protection: Surgical  Other Topics Concern   Not on file  Social History Narrative   Lives with her boyfriend   Social Determinants of Health   Financial Resource Strain: Not on file  Food Insecurity: No Food Insecurity (10/08/2022)   Hunger Vital Sign    Worried About Running Out of Food in the Last Year: Never true    Ran Out of Food in the Last Year: Never true  Transportation Needs: No Transportation Needs (10/08/2022)   PRAPARE - Administrator, Civil Service (Medical): No    Lack of Transportation (Non-Medical): No  Physical Activity: Insufficiently Active (10/08/2022)   Exercise Vital Sign    Days of Exercise per Week: 2 days    Minutes of Exercise per Session: 40 min  Stress: Not on file  Social Connections: Not on file     Family History: The patient's family history includes Anxiety disorder in her mother; Brain cancer in her mother; Cancer in her father, maternal grandmother, and mother; Depression in her mother; Diabetes in her father; Heart attack in her sister; Heart failure in her paternal aunt and sister; Hypertension in her mother; Kidney disease in her father; Kidney failure in her maternal grandfather.  ROS:   Please see the history of present illness.    (+) RLE numbness All other systems reviewed and are negative.  EKGs/Labs/Other Studies Reviewed:    Echocardiogram  10/25/2019: Sonographer Comments: Per order - Did not administer Definity.   IMPRESSIONS   1. Left ventricular ejection fraction, by estimation, is 60 to 65%. The  left ventricle has normal function. The left ventricle has no regional  wall motion abnormalities. There is moderate left ventricular hypertrophy.  Left ventricular diastolic  parameters are consistent with Grade I diastolic  dysfunction (impaired  relaxation).   2. Right ventricular systolic function is normal. The right ventricular  size is normal.   3. The mitral valve is normal in structure. No evidence of mitral valve  regurgitation. No evidence of mitral stenosis.   4. The aortic valve is normal in structure. Aortic valve regurgitation is  not visualized. No aortic stenosis is present.   5. The inferior vena cava is normal in size with greater than 50%  respiratory variability, suggesting right atrial pressure of  3 mmHg.   EKG:  EKG is personally reviewed. 10/08/2022:  Sinus tachycardia. Rate 101 bpm.  LVH with secondary repolarization abnormality.   Recent Labs: 08/06/2022: ALT 48; BUN 23; Creatinine, Ser 1.50; Hemoglobin 15.3; Platelets 326; Potassium 4.9; Sodium 140; TSH 1.850   Recent Lipid Panel    Component Value Date/Time   CHOL 209 (H) 08/06/2022 1158   TRIG 168 (H) 08/06/2022 1158   HDL 41 08/06/2022 1158   CHOLHDL 5.1 (H) 08/06/2022 1158   CHOLHDL 6.0 06/05/2021 1132   VLDL 26 06/05/2021 1132   LDLCALC 138 (H) 08/06/2022 1158    Physical Exam:    VS:  BP (!) 154/86 (BP Location: Right Arm, Patient Position: Sitting, Cuff Size: Large)   Pulse (!) 101   Ht 5\' 2"  (1.575 m)   Wt 233 lb 3.2 oz (105.8 kg)   BMI 42.65 kg/m  , BMI Body mass index is 42.65 kg/m. GENERAL:  Well appearing HEENT: Pupils equal round and reactive, fundi not visualized, oral mucosa unremarkable NECK:  No jugular venous distention, waveform within normal limits, carotid upstroke brisk and symmetric, no bruits, no thyromegaly LUNGS:  Clear to auscultation bilaterally HEART:  RRR.  PMI not displaced or sustained,S1 and S2 within normal limits, no S3, no S4, no clicks, no rubs, no murmurs ABD:  Flat, positive bowel sounds normal in frequency in pitch, no bruits, no rebound, no guarding, no midline pulsatile mass, no hepatomegaly, no splenomegaly EXT:  2 plus pulses throughout, no edema, no cyanosis no clubbing SKIN:   No rashes no nodules NEURO:  Cranial nerves II through XII grossly intact, motor grossly intact throughout PSYCH:  Cognitively intact, oriented to person place and time   ASSESSMENT/PLAN:    # Resistant Hypertension Longstanding history of poorly controlled hypertension, now with improved control on current regimen. Noted improvement in symptoms such as visual disturbances and need for emergency clonidine. -Resume Chlorthalidone. -Track blood pressure readings and follow up in one month. -Continue CPAP - Check renin and aldosterone to rule out hyperaldosteronism - Evaluate for Cushing syndrome.  Dexamethasone suppression and 24 hour urine cortisol  # Chronic Kidney Disease (Stage 3A) Likely secondary to longstanding hypertension. -Order renal ultrasound to assess kidney size and renal artery flow. -Order labs to check for hormonal imbalances contributing to hypertension.  # Diabetes Mellitus Improved control, currently in pre-diabetic range. -Continue current management.  # Hyperlipidemia Stopped statin due to muscle aches. -Discuss potential re-initiation or alternative lipid-lowering therapy at next visit.  #Neuropathy Likely secondary to chemotherapy for cervical cancer. -Refer to water therapy class for exercise and symptom management.  # Family History of Heart Disease Sister with history of heart attack and stents, aunt with history of open heart surgery. -Continue monitoring and managing hypertension and hyperlipidemia to reduce cardiovascular risk.  # Weight Management Difficulty losing weight, possibly related to menopause and neuropathy limiting exercise. -Encourage continuation of water therapy class for exercise and weight management.  Follow-up -Participate in blood pressure monitoring study.  She consents to monitoring in the Vivify RPM system. -Return in one month for blood pressure check and medication adjustment if necessary. -Return in four months for  comprehensive follow-up.        Screening for Secondary Hypertension:     10/08/2022   11:36 AM  Causes  Drugs/Herbals Screened     - Comments Limits sodium.  No EtOH.  Occasional coffee.  Renovascular HTN Screened     - Comments Check renal artery Dopplers  Sleep  Apnea Screened     - Comments Uses CPAP  Thyroid Disease Screened  Hyperaldosteronism Screened     - Comments Check renin and aldosterone  Pheochromocytoma N/A  Cushing's Syndrome Screened     - Comments Check AM cortisol  Hyperparathyroidism Screened  Coarctation of the Aorta Screened     - Comments BP symmetric  Compliance Screened     - Comments out of chlorthalidone for a month    Relevant Labs/Studies:    Latest Ref Rng & Units 08/06/2022   11:58 AM 10/23/2021   12:18 PM 06/05/2021   11:32 AM  Basic Labs  Sodium 134 - 144 mmol/L 140  138  137   Potassium 3.5 - 5.2 mmol/L 4.9  4.0  4.4   Creatinine 0.57 - 1.00 mg/dL 8.29  5.62  1.30        Latest Ref Rng & Units 08/06/2022   11:58 AM 09/20/2019   11:06 AM  Thyroid   TSH 0.450 - 4.500 uIU/mL 1.850  1.910       Disposition:    FU with APP/PharmD in 1 month.  FU with  C. Duke Salvia, MD, [facility] in 4 months.  Medication Adjustments/Labs and Tests Ordered: Current medicines are reviewed at length with the patient today.  Concerns regarding medicines are outlined above.   Orders Placed This Encounter  Procedures   Aldosterone + renin activity w/ ratio   Cortisol   Cortisol, urine, free   Ambulatory referral to Physical Therapy   EKG 12-Lead   VAS US RENAL ARTERY DUPLEX   Meds ordered this encounter  Medications   chlorthalidone (HYGROTON) 25 MG tablet    Sig: Take 1 tablet (25 mg total) by mouth daily.    Dispense:  90 tablet    Refill:  3   dexamethasone (DECADRON) 1 MG tablet    Sig: Take 1 tablet (1 mg total) by mouth once for 1 dose. At 10 pm.   Return for labs the following morning PRIOR to 9AM    Dispense:  1 tablet    Refill:  0    I,Mathew Stumpf,acting as a scribe for Chilton Si, MD.,have documented all relevant documentation on the behalf of Chilton Si, MD,as directed by  Chilton Si, MD while in the presence of Chilton Si, MD.  I,  C. Duke Salvia, MD have reviewed all documentation for this visit.  The documentation of the exam, diagnosis, procedures, and orders on 10/08/2022 are all accurate and complete.   Signed, Chilton Si, MD  10/08/2022 12:55 PM    [facility]

## 2022-10-12 ENCOUNTER — Encounter (INDEPENDENT_AMBULATORY_CARE_PROVIDER_SITE_OTHER): Payer: Self-pay | Admitting: Family Medicine

## 2022-10-12 ENCOUNTER — Ambulatory Visit (INDEPENDENT_AMBULATORY_CARE_PROVIDER_SITE_OTHER): Payer: Medicaid Other | Admitting: Family Medicine

## 2022-10-12 VITALS — BP 130/88 | HR 87 | Temp 98.1°F | Ht 62.0 in | Wt 227.0 lb

## 2022-10-12 DIAGNOSIS — E1169 Type 2 diabetes mellitus with other specified complication: Secondary | ICD-10-CM | POA: Diagnosis not present

## 2022-10-12 DIAGNOSIS — E1122 Type 2 diabetes mellitus with diabetic chronic kidney disease: Secondary | ICD-10-CM | POA: Diagnosis not present

## 2022-10-12 DIAGNOSIS — E785 Hyperlipidemia, unspecified: Secondary | ICD-10-CM

## 2022-10-12 DIAGNOSIS — N1832 Chronic kidney disease, stage 3b: Secondary | ICD-10-CM

## 2022-10-12 DIAGNOSIS — Z7985 Long-term (current) use of injectable non-insulin antidiabetic drugs: Secondary | ICD-10-CM

## 2022-10-12 DIAGNOSIS — E669 Obesity, unspecified: Secondary | ICD-10-CM

## 2022-10-12 DIAGNOSIS — Z6841 Body Mass Index (BMI) 40.0 and over, adult: Secondary | ICD-10-CM

## 2022-10-12 MED ORDER — SEMAGLUTIDE (1 MG/DOSE) 4 MG/3ML ~~LOC~~ SOPN
1.0000 mg | PEN_INJECTOR | SUBCUTANEOUS | 0 refills | Status: DC
Start: 2022-10-12 — End: 2022-11-10

## 2022-10-12 NOTE — Progress Notes (Signed)
Chief Complaint:   OBESITY Tamara Osborne is here to discuss her progress with her obesity treatment plan along with follow-up of her obesity related diagnoses. Tamara Osborne is on keeping a food journal and adhering to recommended goals of 1400-1500 calories and 90 grams of protein and states she is following her eating plan approximately 95% of the time. [facility]  Today's visit was #: 45 Starting weight: 208 lbs Starting date: 09/20/2019 Today's weight: 227 lbs Today's date: 10/12/2022 Total lbs lost to date: 0 Total lbs lost since last in-office visit: 0  Interim History: Patient has been enjoying really significantly good sleep with her CPAP machine.  She mentions her BP has been much better controlled since starting her CPAP.  She has been logging her food on Lose It app and is eating the food she needs to be eating. She is keeping her calories around 1400 (sometimes over).  Her protein intake has been 90g a day.   Subjective:   1. Type 2 diabetes mellitus with stage 3b chronic kidney disease, without long-term current use of insulin (HCC) Patient is on Ozempic 1 mg once weekly.  Her last A1c was 5.9 in June 2024.  2. Hyperlipidemia associated with type 2 diabetes mellitus (HCC) Patient's last LDL was 138, HDL 41, and triglycerides 469.  She had to stop statin due to leg pain and numbness.  Assessment/Plan:   1. Type 2 diabetes mellitus with stage 3b chronic kidney disease, without long-term current use of insulin (HCC) Patient will continue Ozempic 1 mg subcu weekly, and we will refill for 1 month.  - Semaglutide, 1 MG/DOSE, 4 MG/3ML SOPN; Inject 1 mg as directed once a week.  Dispense: 3 mL; Refill: 0  2. Hyperlipidemia associated with type 2 diabetes mellitus [facility]) Patient will follow-up with Cardiology at her next appointment for further management.  3. BMI 40.0-44.9, adult (HCC)  4. Obesity with starting BMI of  39.3 Tamara Osborne is currently in the action stage of change. As such, her goal is to continue with weight loss efforts. She has agreed to keeping a food journal and adhering to recommended goals of 1600-1700 calories and 95+ grams of protein daily.   We will repeat fasting IC at patient's next appointment.   Exercise goals: As is.   Behavioral modification strategies: increasing lean protein intake, meal planning and cooking strategies, keeping healthy foods in the home, and planning for success.  Tamara Osborne has agreed to follow-up with our clinic in 4 weeks. She was informed of the importance of frequent follow-up visits to maximize her success with intensive lifestyle modifications for her multiple health conditions.   Objective:   Blood pressure 130/88, pulse 87, temperature 98.1 F (36.7 C), height 5\' 2"  (1.575 m), weight 227 lb (103 kg), SpO2 96%. Body mass index is 41.52 kg/m.  General: Cooperative, alert, well developed, in no acute distress. HEENT: Conjunctivae and lids unremarkable. Cardiovascular: Regular rhythm.  Lungs: Normal work of breathing. Neurologic: No focal deficits.   Lab Results  Component Value Date   CREATININE 1.50 (H) 08/06/2022   BUN 23 08/06/2022   NA 140 08/06/2022   K 4.9 08/06/2022   CL 102 08/06/2022   CO2 20 08/06/2022   Lab Results  Component Value Date   ALT 48 (H) 08/06/2022   AST 27 08/06/2022   ALKPHOS 97 08/06/2022   BILITOT 0.3 08/06/2022   Lab Results  Component Value Date  HGBA1C 5.9 (H) 08/06/2022   HGBA1C 5.3 07/16/2022   HGBA1C 5.4 06/05/2021   HGBA1C 5.2 01/13/2021   HGBA1C 7.5 (H) 10/18/2020   Lab Results  Component Value Date   INSULIN 40.3 (H) 09/20/2019   Lab Results  Component Value Date   TSH 1.850 08/06/2022   Lab Results  Component Value Date   CHOL 209 (H) 08/06/2022   HDL 41 08/06/2022   LDLCALC 138 (H) 08/06/2022   TRIG 168 (H) 08/06/2022   CHOLHDL 5.1 (H) 08/06/2022   Lab Results  Component Value  Date   VD25OH 66.26 06/05/2021   VD25OH 59.37 10/18/2020   VD25OH 45.60 05/20/2020   Lab Results  Component Value Date   WBC 9.3 08/06/2022   HGB 15.3 08/06/2022   HCT 45.3 08/06/2022   MCV 83 08/06/2022   PLT 326 08/06/2022   Lab Results  Component Value Date   IRON 17 (L) 03/27/2019   TIBC 283 03/27/2019   FERRITIN 32 03/27/2019   Attestation Statements:   Reviewed by clinician on day of visit: allergies, medications, problem list, medical history, surgical history, family history, social history, and previous encounter notes.   I, Burt Knack, am acting as transcriptionist for Reuben Likes, MD.  I have reviewed the above documentation for accuracy and completeness, and I agree with the above. - Reuben Likes, MD

## 2022-10-13 ENCOUNTER — Telehealth: Payer: Self-pay

## 2022-10-13 DIAGNOSIS — Z0000 Encounter for general adult medical examination without abnormal findings: Secondary | ICD-10-CM

## 2022-10-13 NOTE — Telephone Encounter (Signed)
Called the patient to conduct [employer] welcome call and offer health coaching per survey responses. Patient requested her prompt time for the morning be changed to 10am. Patient shared that she had connectivity issues with the device pairing to her phone but was able to connect with Dorena Cookey and troubleshoot. Patient mentioned that she has not had issues since. Patient did not have any additional questions regarding Vivify. Patient's prompt time was adjusted as requested. Offered patient health coaching to aid in increasing the amount of exercise that she engages in. Patient shared what her current exercise routine looks like and what the medical plan is to aid in increasing her ability to exercise more. Patient shared that she is seeing a provider to assist in managing her diabetes and weight. Patient mentioned that has a meal plan to follow, but her challenge is eating fast food when she is out at times. Suggested taking a healthy snack with her to deter her from eating fast food on those occasions. Offered health coaching for support and accountability to her prescribed diet. Patient declined health coaching.    Renaee Munda, MS, ERHD, [facility]  Care Guide, Health & Wellness Coach [address] [place] [address] Telephone: [phone] Email: [email or web address]

## 2022-10-20 ENCOUNTER — Ambulatory Visit: Payer: Medicaid Other | Attending: Cardiovascular Disease

## 2022-10-20 ENCOUNTER — Other Ambulatory Visit: Payer: Self-pay

## 2022-10-20 DIAGNOSIS — R262 Difficulty in walking, not elsewhere classified: Secondary | ICD-10-CM | POA: Insufficient documentation

## 2022-10-20 DIAGNOSIS — M6281 Muscle weakness (generalized): Secondary | ICD-10-CM | POA: Insufficient documentation

## 2022-10-20 DIAGNOSIS — R2681 Unsteadiness on feet: Secondary | ICD-10-CM | POA: Insufficient documentation

## 2022-10-20 DIAGNOSIS — G629 Polyneuropathy, unspecified: Secondary | ICD-10-CM | POA: Insufficient documentation

## 2022-10-20 DIAGNOSIS — R2689 Other abnormalities of gait and mobility: Secondary | ICD-10-CM | POA: Diagnosis present

## 2022-10-20 NOTE — Therapy (Signed)
OUTPATIENT PHYSICAL THERAPY NEURO EVALUATION   Patient Name: Tamara Osborne MRN: [id number] DOB:Dec 02, 1975, 47 y.o., female Today's Date: 10/20/2022   PCP: Alyson Reedy, FNP REFERRING PROVIDER: Chilton Si, MD  END OF SESSION:  PT End of Session - 10/20/22 1106     Visit Number 1    Number of Visits 8    Date for PT Re-Evaluation 12/15/22    Authorization Type [employer]    PT Start Time 1105    PT Stop Time 1145    PT Time Calculation (min) 40 min             Past Medical History:  Diagnosis Date   Anemia    Angio-edema    Anxiety    Bartholin cyst    Cervical cancer (HCC)    Chronic kidney disease    Constipation    Depression    Deviated septum    Diabetes mellitus without complication (HCC)    Difficult intravenous access    used port for 05-09-2019 surgery   Fibroids    History of blood transfusion    2 units given 04-26-2019, has had total of 8 units since jan 2021   History of blood transfusion 1 unit given 05-30-19   iv fluids also given   History of cervical cancer 07/16/2022   History of kidney problems    History of radiation therapy 03/23/2019-05/04/2019   Cervical external beam   Dr Antony Blackbird   History of radiation therapy 05/09/2019-06/05/2019   vaginal brachytherapy   Dr Antony Blackbird   History of recent blood transfusion 05/16/2019   2 units given  per dr Lambert Mody at cancer center   Hypertension    Neuropathy    both thumbs   Obesity    Palpitations    PONV (postoperative nausea and vomiting)    Prediabetes    Preeclampsia 2006   Sleep apnea    no cpap used insurance would not cover, osa severe per pt   SOB (shortness of breath)    Squamous cell carcinoma of cervix (HCC) 03/23/2019   Swallowing difficulty    Symptomatic skin lesion 07/14/2022   Vitamin D deficiency 01/10/2020   Past Surgical History:  Procedure Laterality Date   CESAREAN SECTION WITH BILATERAL TUBAL LIGATION  11/10/2004       DILATION AND  CURETTAGE OF UTERUS  1997   IR IMAGING GUIDED PORT INSERTION  04/04/2019   IR REMOVAL TUN ACCESS W/ PORT W/O FL MOD SED  10/27/2021   OPERATIVE ULTRASOUND N/A 05/09/2019   Procedure: OPERATIVE ULTRASOUND;  Surgeon: Antony Blackbird, MD;  Location: [facility];  Service: Urology;  Laterality: N/A;   OPERATIVE ULTRASOUND N/A 05/15/2019   Procedure: OPERATIVE ULTRASOUND;  Surgeon: Antony Blackbird, MD;  Location: [facility];  Service: Urology;  Laterality: N/A;   OPERATIVE ULTRASOUND N/A 05/22/2019   Procedure: OPERATIVE ULTRASOUND;  Surgeon: Antony Blackbird, MD;  Location: [facility];  Service: Urology;  Laterality: N/A;   OPERATIVE ULTRASOUND N/A 05/29/2019   Procedure: OPERATIVE ULTRASOUND;  Surgeon: Antony Blackbird, MD;  Location: [facility];  Service: Urology;  Laterality: N/A;   OPERATIVE ULTRASOUND N/A 06/05/2019   Procedure: OPERATIVE ULTRASOUND;  Surgeon: Antony Blackbird, MD;  Location: [facility];  Service: Urology;  Laterality: N/A;   TANDEM RING INSERTION N/A 05/09/2019   Procedure: TANDEM RING INSERTION;  Surgeon: Antony Blackbird, MD;  Location: [facility];  Service: Urology;  Laterality:  N/A;   TANDEM RING INSERTION N/A 05/15/2019   Procedure: TANDEM RING INSERTION;  Surgeon: Antony Blackbird, MD;  Location: [facility];  Service: Urology;  Laterality: N/A;   TANDEM RING INSERTION N/A 05/22/2019   Procedure: TANDEM RING INSERTION;  Surgeon: Antony Blackbird, MD;  Location: [facility];  Service: Urology;  Laterality: N/A;   TANDEM RING INSERTION N/A 05/29/2019   Procedure: TANDEM RING INSERTION;  Surgeon: Antony Blackbird, MD;  Location: [facility];  Service: Urology;  Laterality: N/A;   TANDEM RING INSERTION N/A 06/05/2019   Procedure: TANDEM RING INSERTION;  Surgeon: Antony Blackbird, MD;  Location: [facility];  Service: Urology;  Laterality: N/A;   Patient Active  Problem List   Diagnosis Date Noted   HSV-2 infection 07/13/2022   Dyspareunia in female 10/23/2021   Hyperlipidemia associated with type 2 diabetes mellitus (HCC) 07/03/2021   OSA (obstructive sleep apnea) 07/31/2020   Other hyperlipidemia 01/10/2020   Insulin resistance 01/10/2020   Class 2 severe obesity with serious comorbidity and body mass index (BMI) of 39.0 to 39.9 in adult [facility]) 01/10/2020   Peripheral 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) 04/25/2019   CKD (chronic kidney disease), symptom management only, stage 3 (moderate) (HCC) 03/30/2019   Generalized anxiety disorder 03/30/2019   Hypertension associated with type 2 diabetes mellitus (HCC)    Deficiency anemia 03/22/2019    ONSET DATE: 2 years  REFERRING DIAG: G62.9 (ICD-10-CM) - Neuropathy R26.89 (ICD-10-CM) - Balance problem  THERAPY DIAG:  Muscle weakness (generalized)  Other abnormalities of gait and mobility  Difficulty in walking, not elsewhere classified  Unsteadiness on feet  Rationale for Evaluation and Treatment: Rehabilitation  SUBJECTIVE:                                                                                                                                                                                             SUBJECTIVE STATEMENT: Having increasing problems with neuropathy in feet which has been worsened over past two years from hx of chemotherapy.  Notes some feeling of numbness in the right lateral thigh which comes and goes with onset later in the day and/or prolonged standing, denies any back pain.  Pt reports she has been improving in blood sugar management. Pt accompanied by: self  PERTINENT HISTORY: 47 y.o. female with a hx of hypertension, preeclampsia, hyperlipidemia, OSA, diabetes, squamous cell carcinoma of cervix s/p radiation and chemotherapy, morbid obesity. For exercise she was previously in aquatic therapy, now participates in Silver sneakers. She states that every 6 months  she has recurring issues with capsulitis/paronychia in her feet which  has affected her physical activity. She follows with her podiatrist  PAIN:  Are you having pain?  Not much pain, just numbness  PRECAUTIONS: None  RED FLAGS: None   WEIGHT BEARING RESTRICTIONS: No  FALLS: Has patient fallen in last 6 months? No  LIVING ENVIRONMENT: Lives with: lives with their partner Lives in: House/apartment Stairs:  split foyer, 6 steps up/down, bilat HR Has following equipment at home: None  PLOF: Independent  PATIENT GOALS:   OBJECTIVE:   DIAGNOSTIC FINDINGS: N/A for episode  COGNITION: Overall cognitive status:  WFL, pt reports some memory deficits since chemotherapy   SENSATION: Light touch: Impaired  dorsum and plantar surfaces bilateral feet  COORDINATION: WFL  EDEMA:  Some swelling in bilat feet appreciated  MUSCLE TONE: WNL     POSTURE: No Significant postural limitations  LOWER EXTREMITY ROM:     Active  Right Eval Left Eval  Hip flexion    Hip extension    Hip abduction    Hip adduction    Hip internal rotation    Hip external rotation    Knee flexion 115 115  Knee extension 0 0  Ankle dorsiflexion 15 15  Ankle plantarflexion 30 30  Ankle inversion    Ankle eversion     (Blank rows = not tested)  LOWER EXTREMITY MMT:    MMT Right Eval Left Eval  Hip flexion 5 5  Hip extension    Hip abduction    Hip adduction    Hip internal rotation    Hip external rotation    Knee flexion 5 5  Knee extension 5 5  Ankle dorsiflexion 3 4  Ankle plantarflexion 4 4  Ankle inversion    Ankle eversion    (Blank rows = not tested)  BED MOBILITY:  indep  TRANSFERS: Assistive device utilized: None  Sit to stand: Complete Independence Stand to sit: Complete Independence Chair to chair: Complete Independence Floor:  NT    CURB:  Level of Assistance: Modified independence Assistive device utilized: None Curb Comments: increased time to set body  position  STAIRS: Level of Assistance: Modified independence Stair Negotiation Technique: Step to Pattern Alternating Pattern  with Bilateral Rails Number of Stairs: 12  Height of Stairs: 4-6  Comments:   GAIT: Gait pattern: decreased stride length and antalgic Distance walked:  Assistive device utilized: None Level of assistance: Complete Independence Comments:   FUNCTIONAL TESTS:  5 times sit to stand: 14.53 sec Timed up and go (TUG): NT Berg Balance Scale: 49/56  M-CTSIB  Condition 1: Firm Surface, EO 30 Sec, Normal Sway  Condition 2: Firm Surface, EC 30 Sec, Mild and Moderate Sway  Condition 3: Foam Surface, EO 30 Sec, Mild Sway  Condition 4: Foam Surface, EC 30 Sec, Mild and Moderate Sway       PATIENT EDUCATION: Education details: assessment details, rationale of PT intervention Person educated: Patient Education method: Explanation Education comprehension: verbalized understanding  HOME EXERCISE PROGRAM: TBD  GOALS: Goals reviewed with patient? Yes  SHORT TERM GOALS: Target date: 11/17/2022    Patient will be independent in HEP to improve functional outcomes Baseline: Goal status: INITIAL  2.  Demo improved balance and reduce risk for falls per score 52/56 Berg Balance Test Baseline: 49/56 Goal status: INITIAL  3.  Demo improved ambulation tolerance as evidenced by distance of 1,500 ft during  Baseline: TBD  Goal status: INITIAL  LONG TERM GOALS: Target date: 12/15/2022    Patient to be independent with  advanced HEP to include land/aquatic activities as indicated Baseline:  Goal status: INITIAL  2.  Demo 4+/5 bilateral ankle dorsiflexion and plantarflexion strength for improved stability Baseline: see above Goal status: INITIAL  3.  Patient to report absent right thigh numbness for improved comfort Baseline: daily, with prolonged standing Goal status: INITIAL    ASSESSMENT:  CLINICAL IMPRESSION: Patient is a 47 y.o. lady who was  seen today for physical therapy evaluation and treatment for bilateral neuropathy and balance disorder.  Exhibits weakness in bilateral feet/ankles and impaired sensation with corresponding balance deficits per M-CTSIB and [employer] but thankfully demonstrating a low risk for falls per outcome measures.  Pt reports sensory disturbance to right anterior thigh with prolonged standing and/or towards end of day.  Pt reports overall activity tolerance and walking distance deficits due to neuropathy in feet.  Pt would benefit from PT sessions to address deficits and limitations   OBJECTIVE IMPAIRMENTS: Abnormal gait, decreased activity tolerance, decreased balance, difficulty walking, decreased strength, and impaired sensation.   ACTIVITY LIMITATIONS: standing, stairs, and locomotion level  PARTICIPATION LIMITATIONS: cleaning and community activity  PERSONAL FACTORS: Time since onset of injury/illness/exacerbation and 1-2 comorbidities: PMH  are also affecting patient's functional outcome.   REHAB POTENTIAL: Good  CLINICAL DECISION MAKING: Evolving/moderate complexity  EVALUATION COMPLEXITY: Moderate  PLAN:  PT FREQUENCY: 1x/week  PT DURATION: 8 weeks  PLANNED INTERVENTIONS: Therapeutic exercises, Therapeutic activity, Neuromuscular re-education, Balance training, Gait training, Patient/Family education, Self Care, Joint mobilization, Stair training, Vestibular training, Canalith repositioning, DME instructions, Aquatic Therapy, Dry Needling, Electrical stimulation, Spinal mobilization, Cryotherapy, Moist heat, Taping, and Manual therapy  PLAN FOR NEXT SESSION: 6 Minute Walk Test, repeated prone extensions to see if this reproduces right thigh numbness   1:21 PM, 10/20/22 M. Shary Decamp, PT, DPT Physical Therapist- [place] Office Number: [phone]

## 2022-10-26 ENCOUNTER — Ambulatory Visit: Payer: Medicaid Other | Admitting: Podiatry

## 2022-10-26 ENCOUNTER — Encounter: Payer: Self-pay | Admitting: Podiatry

## 2022-10-26 DIAGNOSIS — L600 Ingrowing nail: Secondary | ICD-10-CM | POA: Diagnosis not present

## 2022-10-26 MED ORDER — DOXYCYCLINE HYCLATE 100 MG PO TABS: 100.0000 mg | ORAL_TABLET | Freq: Two times a day (BID) | ORAL | 1 refills | Status: DC

## 2022-10-26 NOTE — Progress Notes (Signed)
Subjective:   Patient ID: Tamara Osborne, female   DOB: 47 y.o.   MRN: [id number]   HPI Patient presents stating that she messed up her left big toe by working on it and she was concerned about redness.  States it hurts mildly but she did not think she saw drainage and states her A1c has been excellent at 5.9   ROS      Objective:  Physical Exam  Neurovascular status intact with inflammation redness of the medial side hallux left localized no proximal edema erythema or drainage currently noted     Assessment:  Paronychia infection of the left hallux medial side secondary to activity     Plan:  H&P reviewed and since its fairly new and only mildly tender I have recommended utilization of antibiotics and patient is placed on doxycycline.  Patient will be seen back to recheck and if any other changes were to occur will be seen immediately and may require nail removal

## 2022-10-29 ENCOUNTER — Ambulatory Visit: Payer: Medicaid Other | Admitting: Physical Therapy

## 2022-10-29 ENCOUNTER — Encounter: Payer: Self-pay | Admitting: Physical Therapy

## 2022-10-29 DIAGNOSIS — R2681 Unsteadiness on feet: Secondary | ICD-10-CM

## 2022-10-29 DIAGNOSIS — R2689 Other abnormalities of gait and mobility: Secondary | ICD-10-CM

## 2022-10-29 DIAGNOSIS — M6281 Muscle weakness (generalized): Secondary | ICD-10-CM | POA: Diagnosis not present

## 2022-10-29 NOTE — Therapy (Signed)
OUTPATIENT PHYSICAL THERAPY NEURO TREATMENT   Patient Name: Tamara Osborne MRN: [id number] DOB:1975-08-31, 47 y.o., female Today's Date: 10/29/2022   PCP: Alyson Reedy, FNP REFERRING PROVIDER: Chilton Si, MD  END OF SESSION:  PT End of Session - 10/29/22 1105     Visit Number 2    Number of Visits 8    Date for PT Re-Evaluation 12/15/22    Authorization Type [place] Medicaid [employer]    PT Start Time 1105    PT Stop Time 1133    PT Time Calculation (min) 28 min    Activity Tolerance Patient tolerated treatment well;Patient limited by pain   session ended due to pain in great toe (from infection)   Behavior During Therapy [facility] for tasks assessed/performed              Past Medical History:  Diagnosis Date   Anemia    Angio-edema    Anxiety    Bartholin cyst    Cervical cancer (HCC)    Chronic kidney disease    Constipation    Depression    Deviated septum    Diabetes mellitus without complication (HCC)    Difficult intravenous access    used port for 05-09-2019 surgery   Fibroids    History of blood transfusion    2 units given 04-26-2019, has had total of 8 units since jan 2021   History of blood transfusion 1 unit given 05-30-19   iv fluids also given   History of cervical cancer 07/16/2022   History of kidney problems    History of radiation therapy 03/23/2019-05/04/2019   Cervical external beam   Dr Antony Blackbird   History of radiation therapy 05/09/2019-06/05/2019   vaginal brachytherapy   Dr Antony Blackbird   History of recent blood transfusion 05/16/2019   2 units given  per dr Lambert Mody at cancer center   Hypertension    Neuropathy    both thumbs   Obesity    Palpitations    PONV (postoperative nausea and vomiting)    Prediabetes    Preeclampsia 2006   Sleep apnea    no cpap used insurance would not cover, osa severe per pt   SOB (shortness of breath)    Squamous cell carcinoma of cervix (HCC) 03/23/2019   Swallowing difficulty    Symptomatic  skin lesion 07/14/2022   Vitamin D deficiency 01/10/2020   Past Surgical History:  Procedure Laterality Date   CESAREAN SECTION WITH BILATERAL TUBAL LIGATION  11/10/2004       DILATION AND CURETTAGE OF UTERUS  1997   IR IMAGING GUIDED PORT INSERTION  04/04/2019   IR REMOVAL TUN ACCESS W/ PORT W/O FL MOD SED  10/27/2021   OPERATIVE ULTRASOUND N/A 05/09/2019   Procedure: OPERATIVE ULTRASOUND;  Surgeon: Antony Blackbird, MD;  Location: [facility];  Service: Urology;  Laterality: N/A;   OPERATIVE ULTRASOUND N/A 05/15/2019   Procedure: OPERATIVE ULTRASOUND;  Surgeon: Antony Blackbird, MD;  Location: [facility];  Service: Urology;  Laterality: N/A;   OPERATIVE ULTRASOUND N/A 05/22/2019   Procedure: OPERATIVE ULTRASOUND;  Surgeon: Antony Blackbird, MD;  Location: [facility];  Service: Urology;  Laterality: N/A;   OPERATIVE ULTRASOUND N/A 05/29/2019   Procedure: OPERATIVE ULTRASOUND;  Surgeon: Antony Blackbird, MD;  Location: [facility];  Service: Urology;  Laterality: N/A;   OPERATIVE ULTRASOUND N/A 06/05/2019   Procedure: OPERATIVE ULTRASOUND;  Surgeon: Antony Blackbird, MD;  Location: [facility];  Service: Urology;  Laterality: N/A;   TANDEM RING INSERTION N/A 05/09/2019   Procedure: TANDEM RING INSERTION;  Surgeon: Antony Blackbird, MD;  Location: [facility];  Service: Urology;  Laterality: N/A;   TANDEM RING INSERTION N/A 05/15/2019   Procedure: TANDEM RING INSERTION;  Surgeon: Antony Blackbird, MD;  Location: [facility];  Service: Urology;  Laterality: N/A;   TANDEM RING INSERTION N/A 05/22/2019   Procedure: TANDEM RING INSERTION;  Surgeon: Antony Blackbird, MD;  Location: [facility];  Service: Urology;  Laterality: N/A;   TANDEM RING INSERTION N/A 05/29/2019   Procedure: TANDEM RING INSERTION;  Surgeon: Antony Blackbird, MD;  Location: [facility];  Service: Urology;  Laterality: N/A;    TANDEM RING INSERTION N/A 06/05/2019   Procedure: TANDEM RING INSERTION;  Surgeon: Antony Blackbird, MD;  Location: [facility];  Service: Urology;  Laterality: N/A;   Patient Active Problem List   Diagnosis Date Noted   HSV-2 infection 07/13/2022   Dyspareunia in female 10/23/2021   Hyperlipidemia associated with type 2 diabetes mellitus (HCC) 07/03/2021   OSA (obstructive sleep apnea) 07/31/2020   Other hyperlipidemia 01/10/2020   Insulin resistance 01/10/2020   Class 2 severe obesity with serious comorbidity and body mass index (BMI) of 39.0 to 39.9 in adult [facility]) 01/10/2020   Peripheral 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) 04/25/2019   CKD (chronic kidney disease), symptom management only, stage 3 (moderate) (HCC) 03/30/2019   Generalized anxiety disorder 03/30/2019   Hypertension associated with type 2 diabetes mellitus (HCC)    Deficiency anemia 03/22/2019    ONSET DATE: 2 years  REFERRING DIAG: G62.9 (ICD-10-CM) - Neuropathy R26.89 (ICD-10-CM) - Balance problem  THERAPY DIAG:  Muscle weakness (generalized)  Other abnormalities of gait and mobility  Unsteadiness on feet  Rationale for Evaluation and Treatment: Rehabilitation  SUBJECTIVE:                                                                                                                                                                                             SUBJECTIVE STATEMENT: Had to see podiatrist this week due to infected ingrown toenail.  He put me on the antibiotics.  It is still bothering me quite a bit.  R lateral thigh goes numb at the end of the day Pt accompanied by: self  PERTINENT HISTORY: 47 y.o. female with a hx of hypertension, preeclampsia, hyperlipidemia, OSA, diabetes, squamous cell carcinoma of cervix s/p radiation and chemotherapy, morbid obesity. For exercise she was previously in aquatic therapy, now participates in Silver sneakers. She states that every 6 months she has  recurring issues with capsulitis/paronychia in her feet which has affected her physical activity. She follows with her podiatrist  PAIN:  Are you having pain?  Not much pain, just numbness  PRECAUTIONS: None  RED FLAGS: None   WEIGHT BEARING RESTRICTIONS: No  FALLS: Has patient fallen in last 6 months? No  LIVING ENVIRONMENT: Lives with: lives with their partner Lives in: House/apartment Stairs:  split foyer, 6 steps up/down, bilat HR Has following equipment at home: None  PLOF: Independent  PATIENT GOALS:   OBJECTIVE:    TODAY'S TREATMENT: 10/29/2022 Activity Comments  Repeated standing lumbar extension, 8 reps No c/o pain  Seated ankle dorsiflexion, 2 x 10 reps; added red theraband   Forward/back walking-2 reps in parallel bars Antalgic gait pattern  Sidestepping-2 reps in parallel bars   Standing feet apart/feet together EO and EC -head turns/head nods x 5 Light UE support         Access Code: [email or web address] URL: [email or web address] Date: 10/29/2022 Prepared by: [facility] Outpatient  Rehab - [facility]  Exercises - Seated Ankle Dorsiflexion with Resistance  - 1 x daily - 7 x weekly - 3 sets - 10 reps - Standing Balance in Corner  - 1 x daily - 7 x weekly - 1 sets - 5 reps - Standing Balance in Corner with Eyes Closed  - 1 x daily - 7 x weekly - 1 sets - 5 reps  PATIENT EDUCATION: Education details: HEP Person educated: Patient Education method: Programmer, multimedia, Demonstration, and Handouts Education comprehension: verbalized understanding, returned demonstration, and needs further education  ---------------------------------------------------------------------------- Objective measures below taken at initial evaluation:  DIAGNOSTIC FINDINGS: N/A for episode  COGNITION: Overall cognitive status:  WFL, pt reports some memory deficits since chemotherapy   SENSATION: Light touch: Impaired  dorsum and plantar surfaces bilateral  feet  COORDINATION: WFL  EDEMA:  Some swelling in bilat feet appreciated  MUSCLE TONE: WNL     POSTURE: No Significant postural limitations  LOWER EXTREMITY ROM:     Active  Right Eval Left Eval  Hip flexion    Hip extension    Hip abduction    Hip adduction    Hip internal rotation    Hip external rotation    Knee flexion 115 115  Knee extension 0 0  Ankle dorsiflexion 15 15  Ankle plantarflexion 30 30  Ankle inversion    Ankle eversion     (Blank rows = not tested)  LOWER EXTREMITY MMT:    MMT Right Eval Left Eval  Hip flexion 5 5  Hip extension    Hip abduction    Hip adduction    Hip internal rotation    Hip external rotation    Knee flexion 5 5  Knee extension 5 5  Ankle dorsiflexion 3 4  Ankle plantarflexion 4 4  Ankle inversion    Ankle eversion    (Blank rows = not tested)  BED MOBILITY:  indep  TRANSFERS: Assistive device utilized: None  Sit to stand: Complete Independence Stand to sit: Complete Independence Chair to chair: Complete Independence Floor:  NT    CURB:  Level of Assistance: Modified independence Assistive device utilized: None Curb Comments: increased time to set body position  STAIRS: Level of Assistance: Modified independence Stair Negotiation Technique: Step to Pattern Alternating Pattern  with Bilateral Rails Number of Stairs: 12  Height of Stairs: 4-6  Comments:   GAIT: Gait pattern: decreased stride length and antalgic Distance walked:  Assistive device utilized: None  Level of assistance: Complete Independence Comments:   FUNCTIONAL TESTS:  5 times sit to stand: 14.53 sec Timed up and go (TUG): NT Berg Balance Scale: 49/56  M-CTSIB  Condition 1: Firm Surface, EO 30 Sec, Normal Sway  Condition 2: Firm Surface, EC 30 Sec, Mild and Moderate Sway  Condition 3: Foam Surface, EO 30 Sec, Mild Sway  Condition 4: Foam Surface, EC 30 Sec, Mild and Moderate Sway       PATIENT EDUCATION: Education  details: assessment details, rationale of PT intervention Person educated: Patient Education method: Explanation Education comprehension: verbalized understanding  HOME EXERCISE PROGRAM: TBD  GOALS: Goals reviewed with patient? Yes  SHORT TERM GOALS: Target date: 11/17/2022    Patient will be independent in HEP to improve functional outcomes Baseline: Goal status: IN PROGRESS  2.  Demo improved balance and reduce risk for falls per score 52/56 Berg Balance Test Baseline: 49/56 Goal status: IN PROGRESS  3.  Demo improved ambulation tolerance as evidenced by distance of 1,500 ft during  Baseline: TBD  Goal status:IN PROGRESS  LONG TERM GOALS: Target date: 12/15/2022    Patient to be independent with advanced HEP to include land/aquatic activities as indicated Baseline:  Goal status: IN PROGRESS  2.  Demo 4+/5 bilateral ankle dorsiflexion and plantarflexion strength for improved stability Baseline: see above Goal status: IN PROGRESS  3.  Patient to report absent right thigh numbness for improved comfort Baseline: daily, with prolonged standing Goal status: IN PROGRESS    ASSESSMENT:  CLINICAL IMPRESSION: Pt arrives to PT session today with increased L foot/toe pain due to infection of ingrown toenail.  She has seen podiatrist and is on antibiotics, but any gait-related activities are impacted today due to antalgic gait.  Initiated HEP for ankle strengthening for weaker R ankle dorsiflexors; also initiated HEP for standing balance.  She is not having any pain in R lateral thigh today, and it is not reproduced with standing repeated extension.  She will continue to benefit from skilled PT towards goals for improved balance and functional mobility.     OBJECTIVE IMPAIRMENTS: Abnormal gait, decreased activity tolerance, decreased balance, difficulty walking, decreased strength, and impaired sensation.   ACTIVITY LIMITATIONS: standing, stairs, and locomotion  level  PARTICIPATION LIMITATIONS: cleaning and community activity  PERSONAL FACTORS: Time since onset of injury/illness/exacerbation and 1-2 comorbidities: PMH  are also affecting patient's functional outcome.   REHAB POTENTIAL: Good  CLINICAL DECISION MAKING: Evolving/moderate complexity  EVALUATION COMPLEXITY: Moderate  PLAN:  PT FREQUENCY: 1x/week  PT DURATION: 8 weeks  PLANNED INTERVENTIONS: Therapeutic exercises, Therapeutic activity, Neuromuscular re-education, Balance training, Gait training, Patient/Family education, Self Care, Joint mobilization, Stair training, Vestibular training, Canalith repositioning, DME instructions, Aquatic Therapy, Dry Needling, Electrical stimulation, Spinal mobilization, Cryotherapy, Moist heat, Taping, and Manual therapy  PLAN FOR NEXT SESSION: Review HEP from this visit; perform 6 Minute Walk Test (not done today due to L toe pain), repeated prone extensions to see if this reproduces right thigh numbness   Lonia Blood, PT 10/29/22 1:21 PM Phone: [phone] Fax: [phone]  [facility] Re[facility] at [facility] [address] [place], [place] [address] Phone # [phone] Fax # [phone]

## 2022-10-31 ENCOUNTER — Other Ambulatory Visit (INDEPENDENT_AMBULATORY_CARE_PROVIDER_SITE_OTHER): Payer: Self-pay | Admitting: Primary Care

## 2022-10-31 DIAGNOSIS — I10 Essential (primary) hypertension: Secondary | ICD-10-CM

## 2022-10-31 DIAGNOSIS — Z760 Encounter for issue of repeat prescription: Secondary | ICD-10-CM

## 2022-11-03 ENCOUNTER — Telehealth (HOSPITAL_BASED_OUTPATIENT_CLINIC_OR_DEPARTMENT_OTHER): Payer: Self-pay | Admitting: Family Medicine

## 2022-11-03 ENCOUNTER — Other Ambulatory Visit (HOSPITAL_BASED_OUTPATIENT_CLINIC_OR_DEPARTMENT_OTHER): Payer: Self-pay

## 2022-11-03 DIAGNOSIS — I10 Essential (primary) hypertension: Secondary | ICD-10-CM

## 2022-11-03 DIAGNOSIS — Z760 Encounter for issue of repeat prescription: Secondary | ICD-10-CM

## 2022-11-03 MED ORDER — AMLODIPINE BESYLATE-VALSARTAN 10-320 MG PO TABS
1.0000 | ORAL_TABLET | Freq: Every day | ORAL | 0 refills | Status: DC
Start: 2022-11-03 — End: 2023-04-23

## 2022-11-03 NOTE — Telephone Encounter (Signed)
Medication has been filled 

## 2022-11-03 NOTE — Telephone Encounter (Signed)
Requested by interface surescripts   Requested Prescriptions  Pending Prescriptions Disp Refills   amLODipine-valsartan (EXFORGE) 10-320 MG tablet [Pharmacy Med Name: AMLODIPINE-VALSARTAN 10-320MG  TABS] 90 tablet 0    Sig: TAKE 1 TABLET BY MOUTH DAILY     Cardiovascular: CCB + ARB Combos Failed - 10/31/2022 10:14 AM      Failed - Cr in normal range and within 180 days    Creatinine  Date Value Ref Range Status  12/12/2020 1.44 (H) 0.44 - 1.00 mg/dL Final   Creatinine, Ser  Date Value Ref Range Status  08/06/2022 1.50 (H) 0.57 - 1.00 mg/dL Final         Passed - K in normal range and within 180 days    Potassium  Date Value Ref Range Status  08/06/2022 4.9 3.5 - 5.2 mmol/L Final         Passed - Na in normal range and within 180 days    Sodium  Date Value Ref Range Status  08/06/2022 140 134 - 144 mmol/L Final         Passed - Patient is not pregnant      Passed - Last BP in normal range    BP Readings from Last 1 Encounters:  10/12/22 130/88         Passed - Valid encounter within last 6 months    Recent Outpatient Visits           6 months ago Colon cancer screening   [place] [facility] Grayce Sessions, NP   10 months ago Sinus congestion   [place] [facility] Grayce Sessions, NP   12 months ago Class 3 severe obesity with serious comorbidity and body mass index (BMI) of 40.0 to 44.9 in adult, unspecified obesity type (HCC), CURRENT BMI 39.9   [place] [facility] Grayce Sessions, NP   1 year ago Medication refill   [place] [facility] Grayce Sessions, NP   1 year ago Type 2 diabetes mellitus without complication, without long-term current use of insulin [facility])   [place] [facility] [place], Cornelius Moras, RPH-CPP       Future Appointments             In 1 month Dan Humphreys, Storm Frisk, NP  Heart & Vascular at [facility]    In 3 months Chilton Si, MD [facility] at [facility], [place]

## 2022-11-03 NOTE — Telephone Encounter (Signed)
Prescription Request  11/03/2022  LOV: 08/06/2022  What is the name of the medication or equipment? Exforge 10-320  Have you contacted your pharmacy to request a refill? Yes   Which pharmacy would you like this sent to?  [facility] [id number] - [place], [place] - [address] AT [facility] [address] [place] [address] Phone: [phone] Fax: [phone]    Patient notified that their request is being sent to the clinical staff for review and that they should receive a response within 2 business days.   Please advise at [facility] [phone]

## 2022-11-04 ENCOUNTER — Encounter (HOSPITAL_BASED_OUTPATIENT_CLINIC_OR_DEPARTMENT_OTHER): Payer: Medicaid Other

## 2022-11-05 ENCOUNTER — Ambulatory Visit: Payer: Medicaid Other | Attending: Cardiovascular Disease | Admitting: Physical Therapy

## 2022-11-05 ENCOUNTER — Encounter: Payer: Self-pay | Admitting: *Deleted

## 2022-11-05 ENCOUNTER — Encounter: Payer: Self-pay | Admitting: Physical Therapy

## 2022-11-05 DIAGNOSIS — R2681 Unsteadiness on feet: Secondary | ICD-10-CM | POA: Insufficient documentation

## 2022-11-05 DIAGNOSIS — R2689 Other abnormalities of gait and mobility: Secondary | ICD-10-CM | POA: Insufficient documentation

## 2022-11-05 DIAGNOSIS — M6281 Muscle weakness (generalized): Secondary | ICD-10-CM | POA: Diagnosis present

## 2022-11-05 NOTE — Therapy (Signed)
OUTPATIENT PHYSICAL THERAPY NEURO TREATMENT   Patient Name: Tamara Osborne MRN: [id number] DOB:06/17/1975, 47 y.o., female Today's Date: 11/05/2022   PCP: Alyson Reedy, FNP REFERRING PROVIDER: Chilton Si, MD  END OF SESSION:  PT End of Session - 11/05/22 1316     Visit Number 3    Number of Visits 8    Date for PT Re-Evaluation 12/15/22    Authorization Type [place] [employer]    PT Start Time 1317    PT Stop Time 1359    PT Time Calculation (min) 42 min    Activity Tolerance Patient tolerated treatment well    Behavior During Therapy WFL for tasks assessed/performed               Past Medical History:  Diagnosis Date   Anemia    Angio-edema    Anxiety    Bartholin cyst    Cervical cancer (HCC)    Chronic kidney disease    Constipation    Depression    Deviated septum    Diabetes mellitus without complication (HCC)    Difficult intravenous access    used port for 05-09-2019 surgery   Fibroids    History of blood transfusion    2 units given 04-26-2019, has had total of 8 units since jan 2021   History of blood transfusion 1 unit given 05-30-19   iv fluids also given   History of cervical cancer 07/16/2022   History of kidney problems    History of radiation therapy 03/23/2019-05/04/2019   Cervical external beam   Dr Antony Blackbird   History of radiation therapy 05/09/2019-06/05/2019   vaginal brachytherapy   Dr Antony Blackbird   History of recent blood transfusion 05/16/2019   2 units given  per dr Lambert Mody at cancer center   Hypertension    Neuropathy    both thumbs   Obesity    Palpitations    PONV (postoperative nausea and vomiting)    Prediabetes    Preeclampsia 2006   Sleep apnea    no cpap used insurance would not cover, osa severe per pt   SOB (shortness of breath)    Squamous cell carcinoma of cervix (HCC) 03/23/2019   Swallowing difficulty    Symptomatic skin lesion 07/14/2022   Vitamin D deficiency 01/10/2020   Past Surgical  History:  Procedure Laterality Date   CESAREAN SECTION WITH BILATERAL TUBAL LIGATION  11/10/2004       DILATION AND CURETTAGE OF UTERUS  1997   IR IMAGING GUIDED PORT INSERTION  04/04/2019   IR REMOVAL TUN ACCESS W/ PORT W/O FL MOD SED  10/27/2021   OPERATIVE ULTRASOUND N/A 05/09/2019   Procedure: OPERATIVE ULTRASOUND;  Surgeon: Antony Blackbird, MD;  Location: [facility];  Service: Urology;  Laterality: N/A;   OPERATIVE ULTRASOUND N/A 05/15/2019   Procedure: OPERATIVE ULTRASOUND;  Surgeon: Antony Blackbird, MD;  Location: [facility];  Service: Urology;  Laterality: N/A;   OPERATIVE ULTRASOUND N/A 05/22/2019   Procedure: OPERATIVE ULTRASOUND;  Surgeon: Antony Blackbird, MD;  Location: [facility];  Service: Urology;  Laterality: N/A;   OPERATIVE ULTRASOUND N/A 05/29/2019   Procedure: OPERATIVE ULTRASOUND;  Surgeon: Antony Blackbird, MD;  Location: [facility];  Service: Urology;  Laterality: N/A;   OPERATIVE ULTRASOUND N/A 06/05/2019   Procedure: OPERATIVE ULTRASOUND;  Surgeon: Antony Blackbird, MD;  Location: [facility];  Service: Urology;  Laterality: N/A;   TANDEM RING INSERTION N/A 05/09/2019  Procedure: TANDEM RING INSERTION;  Surgeon: Antony Blackbird, MD;  Location: [facility];  Service: Urology;  Laterality: N/A;   TANDEM RING INSERTION N/A 05/15/2019   Procedure: TANDEM RING INSERTION;  Surgeon: Antony Blackbird, MD;  Location: [facility];  Service: Urology;  Laterality: N/A;   TANDEM RING INSERTION N/A 05/22/2019   Procedure: TANDEM RING INSERTION;  Surgeon: Antony Blackbird, MD;  Location: [facility];  Service: Urology;  Laterality: N/A;   TANDEM RING INSERTION N/A 05/29/2019   Procedure: TANDEM RING INSERTION;  Surgeon: Antony Blackbird, MD;  Location: [facility];  Service: Urology;  Laterality: N/A;   TANDEM RING INSERTION N/A 06/05/2019   Procedure: TANDEM RING INSERTION;   Surgeon: Antony Blackbird, MD;  Location: [facility];  Service: Urology;  Laterality: N/A;   Patient Active Problem List   Diagnosis Date Noted   HSV-2 infection 07/13/2022   Dyspareunia in female 10/23/2021   Hyperlipidemia associated with type 2 diabetes mellitus (HCC) 07/03/2021   OSA (obstructive sleep apnea) 07/31/2020   Other hyperlipidemia 01/10/2020   Insulin resistance 01/10/2020   Class 2 severe obesity with serious comorbidity and body mass index (BMI) of 39.0 to 39.9 in adult [facility]) 01/10/2020   Peripheral 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) 04/25/2019   CKD (chronic kidney disease), symptom management only, stage 3 (moderate) (HCC) 03/30/2019   Generalized anxiety disorder 03/30/2019   Hypertension associated with type 2 diabetes mellitus (HCC)    Deficiency anemia 03/22/2019    ONSET DATE: 2 years  REFERRING DIAG: G62.9 (ICD-10-CM) - Neuropathy R26.89 (ICD-10-CM) - Balance problem  THERAPY DIAG:  Muscle weakness (generalized)  Other abnormalities of gait and mobility  Unsteadiness on feet  Rationale for Evaluation and Treatment: Rehabilitation  SUBJECTIVE:                                                                                                                                                                                             SUBJECTIVE STATEMENT: The antibiotic is helping a lot.  It looks a lot better.  It doesn't hurt anymore.  It's just my regular waddle walk pattern now.  Had two episodes of L thigh numbness over the weekend.  ?Doctor thinks it might be my back, but I've never had any back problems.  Both times were after standing and walking a bit.  Pt accompanied by: self  PERTINENT HISTORY: 47 y.o. female with a hx of hypertension, preeclampsia, hyperlipidemia, OSA, diabetes, squamous cell carcinoma of cervix s/p radiation and chemotherapy, morbid obesity. For exercise she was previously in aquatic therapy, now participates in  Silver sneakers. She states that every 6 months she has recurring issues with capsulitis/paronychia in her feet which has affected her physical activity. She follows with her podiatrist  PAIN:  Are you having pain?  Not much pain, just numbness  PRECAUTIONS: None  RED FLAGS: None   WEIGHT BEARING RESTRICTIONS: No  FALLS: Has patient fallen in last 6 months? No  LIVING ENVIRONMENT: Lives with: lives with their partner Lives in: House/apartment Stairs:  split foyer, 6 steps up/down, bilat HR Has following equipment at home: None  PLOF: Independent  PATIENT GOALS:   OBJECTIVE:    TODAY'S TREATMENT: 11/05/2022 Activity Comments  6 MWT:   Pre vitals: 132/85, HR 85 Post vitals:  138/93 HR 92 Distance 630 ft Pain rated at R hip 4-5/10 RPE 7-8/10 *Pt with trendelenburg pattern, dipping towards R side, decreased arm swing  MMT: R hip abduction 3+/5 L hip abduction 4/5 R hip extension 3/5 L hip extension 3+/5   R sidelying hip abduction 10 reps   Bridging x 10   Hip external rotation stretch, R 60 sec       Access Code: [email or web address] URL: [email or web address] Date: 11/05/2022 Prepared by: [facility] - Outpatient  Rehab - [facility]  Exercises - Seated Ankle Dorsiflexion with Resistance  - 1 x daily - 7 x weekly - 3 sets - 10 reps - Standing Balance in Corner  - 1 x daily - 7 x weekly - 1 sets - 5 reps - Standing Balance in Corner with Eyes Closed  - 1 x daily - 7 x weekly - 1 sets - 5 reps - Sidelying Hip Abduction  - 1 x daily - 5 x weekly - 3 sets - 10 reps - Supine Bridge  - 1 x daily - 5 x weekly - 3 sets - 10 reps - 3 sec hold - Supine Figure 4 Piriformis Stretch  - 1 x daily - 7 x weekly - 1 sets - 3 reps - 15-30 sec hold  PATIENT EDUCATION: Education details: HEP additions Person educated: Patient Education method: Programmer, multimedia, Demonstration, and Handouts Education comprehension: verbalized understanding, returned demonstration, and needs  further education   PATIENT EDUCATION: Education details: HEP additions; R hip weakness contributing to gait pattern Person educated: Patient Education method: Programmer, multimedia, Demonstration, and Handouts Education comprehension: verbalized understanding, returned demonstration, and needs further education  ---------------------------------------------------------------------------- Objective measures below taken at initial evaluation:  DIAGNOSTIC FINDINGS: N/A for episode  COGNITION: Overall cognitive status:  WFL, pt reports some memory deficits since chemotherapy   SENSATION: Light touch: Impaired  dorsum and plantar surfaces bilateral feet  COORDINATION: WFL  EDEMA:  Some swelling in bilat feet appreciated  MUSCLE TONE: WNL     POSTURE: No Significant postural limitations  LOWER EXTREMITY ROM:     Active  Right Eval Left Eval  Hip flexion    Hip extension    Hip abduction    Hip adduction    Hip internal rotation    Hip external rotation    Knee flexion 115 115  Knee extension 0 0  Ankle dorsiflexion 15 15  Ankle plantarflexion 30 30  Ankle inversion    Ankle eversion     (Blank rows = not tested)  LOWER EXTREMITY MMT:    MMT Right Eval Left Eval  Hip flexion 5 5  Hip extension    Hip abduction    Hip adduction    Hip internal rotation    Hip external rotation    Knee flexion  5 5  Knee extension 5 5  Ankle dorsiflexion 3 4  Ankle plantarflexion 4 4  Ankle inversion    Ankle eversion    (Blank rows = not tested)  BED MOBILITY:  indep  TRANSFERS: Assistive device utilized: None  Sit to stand: Complete Independence Stand to sit: Complete Independence Chair to chair: Complete Independence Floor:  NT    CURB:  Level of Assistance: Modified independence Assistive device utilized: None Curb Comments: increased time to set body position  STAIRS: Level of Assistance: Modified independence Stair Negotiation Technique: Step to  Pattern Alternating Pattern  with Bilateral Rails Number of Stairs: 12  Height of Stairs: 4-6  Comments:   GAIT: Gait pattern: decreased stride length and antalgic Distance walked:  Assistive device utilized: None Level of assistance: Complete Independence Comments:   FUNCTIONAL TESTS:  5 times sit to stand: 14.53 sec Timed up and go (TUG): NT Berg Balance Scale: 49/56  M-CTSIB  Condition 1: Firm Surface, EO 30 Sec, Normal Sway  Condition 2: Firm Surface, EC 30 Sec, Mild and Moderate Sway  Condition 3: Foam Surface, EO 30 Sec, Mild Sway  Condition 4: Foam Surface, EC 30 Sec, Mild and Moderate Sway       PATIENT EDUCATION: Education details: assessment details, rationale of PT intervention Person educated: Patient Education method: Explanation Education comprehension: verbalized understanding  HOME EXERCISE PROGRAM: TBD  GOALS: Goals reviewed with patient? Yes  SHORT TERM GOALS: Target date: 11/17/2022    Patient will be independent in HEP to improve functional outcomes Baseline: Goal status: IN PROGRESS  2.  Demo improved balance and reduce risk for falls per score 52/56 Berg Balance Test Baseline: 49/56 Goal status: IN PROGRESS  3.  Demo improved ambulation tolerance as evidenced by distance of 1,500 ft during  Baseline: TBD  Goal status:IN PROGRESS  LONG TERM GOALS: Target date: 12/15/2022    Patient to be independent with advanced HEP to include land/aquatic activities as indicated Baseline:  Goal status: IN PROGRESS  2.  Demo 4+/5 bilateral ankle dorsiflexion and plantarflexion strength for improved stability Baseline: see above Goal status: IN PROGRESS  3.  Patient to report absent right thigh numbness for improved comfort Baseline: daily, with prolonged standing Goal status: IN PROGRESS    ASSESSMENT:  CLINICAL IMPRESSION: Skilled PT session focused on assessing , with pt ambulating 630 ft in 6 minutes.  She has trendelenburg  pattern with gait, with dipping towards R side.  She reports pain and discomfort in R hip area, but no numbness in R thigh.  With further MMT, pt noted to have more weakness in R hip abductors and R hip extensors than L side.  This is likely contributing to pt's c/o pain; also noted tightness at R outer thigh.  Added to HEP to address strengthening and stretch.  Pt return demo understanding and does not have c/o at end of session.    OBJECTIVE IMPAIRMENTS: Abnormal gait, decreased activity tolerance, decreased balance, difficulty walking, decreased strength, and impaired sensation.   ACTIVITY LIMITATIONS: standing, stairs, and locomotion level  PARTICIPATION LIMITATIONS: cleaning and community activity  PERSONAL FACTORS: Time since onset of injury/illness/exacerbation and 1-2 comorbidities: PMH  are also affecting patient's functional outcome.   REHAB POTENTIAL: Good  CLINICAL DECISION MAKING: Evolving/moderate complexity  EVALUATION COMPLEXITY: Moderate  PLAN:  PT FREQUENCY: 1x/week  PT DURATION: 8 weeks  PLANNED INTERVENTIONS: Therapeutic exercises, Therapeutic activity, Neuromuscular re-education, Balance training, Gait training, Patient/Family education, Self Care, Joint mobilization, Stair training,  Vestibular training, Canalith repositioning, DME instructions, Aquatic Therapy, Dry Needling, Electrical stimulation, Spinal mobilization, Cryotherapy, Moist heat, Taping, and Manual therapy  PLAN FOR NEXT SESSION: Review HEP additions from this visit; repeated prone extensions to see if this reproduces right thigh numbness ( no pain in prone positioning for MMT for hip) *need to communicate with patient about aquatics and info for [facility] pool sessions-had to cancel due to L toe infection)   Lonia Blood, PT 11/05/22 2:49 PM Phone: [phone] Fax: [phone]  [facility] at [facility] [address], [address] [place], [place] [address] Phone #  [phone] Fax # [phone]

## 2022-11-09 ENCOUNTER — Telehealth (INDEPENDENT_AMBULATORY_CARE_PROVIDER_SITE_OTHER): Payer: Medicaid Other | Admitting: Family Medicine

## 2022-11-10 ENCOUNTER — Encounter (INDEPENDENT_AMBULATORY_CARE_PROVIDER_SITE_OTHER): Payer: Self-pay | Admitting: Adult Health

## 2022-11-10 ENCOUNTER — Ambulatory Visit (INDEPENDENT_AMBULATORY_CARE_PROVIDER_SITE_OTHER): Payer: Medicaid Other | Admitting: Adult Health

## 2022-11-10 VITALS — BP 132/77 | HR 72 | Temp 98.0°F | Ht 62.0 in | Wt 226.0 lb

## 2022-11-10 DIAGNOSIS — N1832 Chronic kidney disease, stage 3b: Secondary | ICD-10-CM

## 2022-11-10 DIAGNOSIS — R0602 Shortness of breath: Secondary | ICD-10-CM | POA: Diagnosis not present

## 2022-11-10 DIAGNOSIS — I152 Hypertension secondary to endocrine disorders: Secondary | ICD-10-CM

## 2022-11-10 DIAGNOSIS — E1122 Type 2 diabetes mellitus with diabetic chronic kidney disease: Secondary | ICD-10-CM

## 2022-11-10 DIAGNOSIS — E669 Obesity, unspecified: Secondary | ICD-10-CM

## 2022-11-10 DIAGNOSIS — E1159 Type 2 diabetes mellitus with other circulatory complications: Secondary | ICD-10-CM

## 2022-11-10 DIAGNOSIS — I129 Hypertensive chronic kidney disease with stage 1 through stage 4 chronic kidney disease, or unspecified chronic kidney disease: Secondary | ICD-10-CM | POA: Diagnosis not present

## 2022-11-10 DIAGNOSIS — Z6841 Body Mass Index (BMI) 40.0 and over, adult: Secondary | ICD-10-CM

## 2022-11-10 MED ORDER — SEMAGLUTIDE (1 MG/DOSE) 4 MG/3ML ~~LOC~~ SOPN: 1.0000 mg | PEN_INJECTOR | SUBCUTANEOUS | 0 refills | Status: DC

## 2022-11-10 NOTE — Progress Notes (Signed)
WEIGHT SUMMARY AND BIOMETRICS  Vitals Temp: 98 F (36.7 C) BP: 132/77 Pulse Rate: 72 SpO2: 99 %   Anthropometric Measurements Height: 5\' 2"  (1.575 m) Weight: 226 lb (102.5 kg) BMI (Calculated): 41.33 Weight at Last Visit: 227lb Weight Lost Since Last Visit: 1lb Weight Gained Since Last Visit: 0 Starting Weight: 208lb Total Weight Loss (lbs): 0 lb (0 kg)   Body Composition  Body Fat %: 46.5 % Fat Mass (lbs): 105 lbs Muscle Mass (lbs): 114.8 lbs Total Body Water (lbs): 83 lbs Visceral Fat Rating : 14   Other Clinical Data RMR: 2088 Fasting: yes Labs: yes Today's Visit #: 46 Starting Date: 09/20/19    Chief Complaint:   OBESITY Tamara Osborne is here to discuss her progress with her obesity treatment plan. She is on the keeping a food journal and adhering to recommended goals of 1400-1500 calories and 95 protein and states she is following her eating plan approximately 90 % of the time. She states she is exercising - Physical Therapy   Interim History:  IC Completed today- RMR improving! She continues to track intake consistently on Lose it App.  She is tolerating 1mg  Ozempic once weekly injection.  She reports stable appetite on this strength, higher causes complete loss of appetite.   Subjective:   1. SOB (shortness of breath) on exertion She endorses dyspnea with extreme exertion, denies CP  09/20/19 08:00  RMR 1371    05/14/21 09:00  RMR 2002    11/10/22 08:00  RMR 2088   RMR has steadily increased and IS above anticipated RMR  2. Hypertension associated with type 2 diabetes mellitus (HCC) BP stable. Home readings SBP:110-120s DBP: 60-80s She denies acute cardiac sx's at present  She is on Exforge 10/320mg  every day and Hygroton 25mg  every day   3. Type 2 diabetes mellitus with stage 3b chronic kidney disease, without long-term current use of insulin (HCC) Lab Results  Component Value Date   HGBA1C 5.9 (H) 08/06/2022   HGBA1C 5.3  07/16/2022   HGBA1C 5.4 06/05/2021   She is weekly Ozempic 1mg  injection Denies mass in neck, dysphagia, dyspepsia, persistent hoarseness, abdominal pain, or N/V/C  Per pt- higher dose completely suppressed appetite- unable to eat for long periods  She very infrequently checks CBG- cannot recall any readings (been over a month)  She denies sx's of hypoglycemia  Assessment/Plan:   1. SOB (shortness of breath) on exertion Small increase in daily calorie and protein intake to account for increased RMR  2. Hypertension associated with type 2 diabetes mellitus (HCC) Remain well hydrated with water Continue to monitor home BP readings  3. Type 2 diabetes mellitus with stage 3b chronic kidney disease, without long-term current use of insulin (HCC) Refill - Semaglutide, 1 MG/DOSE, 4 MG/3ML SOPN; Inject 1 mg as directed once a week.  Dispense: 3 mL; Refill: 0  4. BMI 40.0-44.9, adult (HCC), Current BMI 41.33  Small increase in daily calorie and protein intake to account for increased RMR  Tamara Osborne is currently in the action stage of change. As such, her goal is to continue with weight loss efforts. She has agreed to keeping a food journal and adhering to recommended goals of 1450-1550 calories and 100 protein.   Exercise goals: All adults should avoid inactivity. Some physical activity is better than none, and adults who participate in any amount of physical activity gain some health benefits. Adults should also include muscle-strengthening activities that involve all major muscle groups on 2  or more days a week.  Behavioral modification strategies: increasing lean protein intake, decreasing simple carbohydrates, increasing vegetables, increasing water intake, no skipping meals, meal planning and cooking strategies, and planning for success.  Tamara Osborne has agreed to follow-up with our clinic in 4 weeks. She was informed of the importance of frequent follow-up visits to maximize her success  with intensive lifestyle modifications for her multiple health conditions.   Objective:   Blood pressure 132/77, pulse 72, temperature 98 F (36.7 C), height 5\' 2"  (1.575 m), weight 226 lb (102.5 kg), SpO2 99%. Body mass index is 41.34 kg/m.  General: Cooperative, alert, well developed, in no acute distress. HEENT: Conjunctivae and lids unremarkable. Cardiovascular: Regular rhythm.  Lungs: Normal work of breathing. Neurologic: No focal deficits.   Lab Results  Component Value Date   CREATININE 1.50 (H) 08/06/2022   BUN 23 08/06/2022   NA 140 08/06/2022   K 4.9 08/06/2022   CL 102 08/06/2022   CO2 20 08/06/2022   Lab Results  Component Value Date   ALT 48 (H) 08/06/2022   AST 27 08/06/2022   ALKPHOS 97 08/06/2022   BILITOT 0.3 08/06/2022   Lab Results  Component Value Date   HGBA1C 5.9 (H) 08/06/2022   HGBA1C 5.3 07/16/2022   HGBA1C 5.4 06/05/2021   HGBA1C 5.2 01/13/2021   HGBA1C 7.5 (H) 10/18/2020   Lab Results  Component Value Date   INSULIN 40.3 (H) 09/20/2019   Lab Results  Component Value Date   TSH 1.850 08/06/2022   Lab Results  Component Value Date   CHOL 209 (H) 08/06/2022   HDL 41 08/06/2022   LDLCALC 138 (H) 08/06/2022   TRIG 168 (H) 08/06/2022   CHOLHDL 5.1 (H) 08/06/2022   Lab Results  Component Value Date   VD25OH 66.26 06/05/2021   VD25OH 59.37 10/18/2020   VD25OH 45.60 05/20/2020   Lab Results  Component Value Date   WBC 9.3 08/06/2022   HGB 15.3 08/06/2022   HCT 45.3 08/06/2022   MCV 83 08/06/2022   PLT 326 08/06/2022   Lab Results  Component Value Date   IRON 17 (L) 03/27/2019   TIBC 283 03/27/2019   FERRITIN 32 03/27/2019   Attestation Statements:   Reviewed by clinician on day of visit: allergies, medications, problem list, medical history, surgical history, family history, social history, and previous encounter notes.  I have reviewed the above documentation for accuracy and completeness, and I agree with the above. -   Doral Ventrella d. Mardy Lucier, NP-C

## 2022-11-12 ENCOUNTER — Ambulatory Visit: Payer: Medicaid Other | Admitting: Physical Therapy

## 2022-11-19 ENCOUNTER — Ambulatory Visit: Payer: Medicaid Other | Admitting: Physical Therapy

## 2022-11-24 ENCOUNTER — Ambulatory Visit (HOSPITAL_BASED_OUTPATIENT_CLINIC_OR_DEPARTMENT_OTHER): Payer: Medicaid Other

## 2022-11-24 ENCOUNTER — Ambulatory Visit (HOSPITAL_BASED_OUTPATIENT_CLINIC_OR_DEPARTMENT_OTHER): Payer: Medicaid Other | Admitting: Family Medicine

## 2022-11-24 VITALS — BP 121/62 | HR 84 | Ht 62.0 in | Wt 228.0 lb

## 2022-11-24 DIAGNOSIS — E1159 Type 2 diabetes mellitus with other circulatory complications: Secondary | ICD-10-CM

## 2022-11-24 DIAGNOSIS — N183 Chronic kidney disease, stage 3 unspecified: Secondary | ICD-10-CM | POA: Diagnosis not present

## 2022-11-24 DIAGNOSIS — I152 Hypertension secondary to endocrine disorders: Secondary | ICD-10-CM

## 2022-11-24 DIAGNOSIS — Z7985 Long-term (current) use of injectable non-insulin antidiabetic drugs: Secondary | ICD-10-CM

## 2022-11-24 DIAGNOSIS — E1169 Type 2 diabetes mellitus with other specified complication: Secondary | ICD-10-CM | POA: Diagnosis not present

## 2022-11-24 DIAGNOSIS — E785 Hyperlipidemia, unspecified: Secondary | ICD-10-CM

## 2022-11-24 NOTE — Assessment & Plan Note (Signed)
Patient presents today with well-controlled blood pressure. Patient in no acute distress and is well-appearing. Denies chest pain, shortness of breath, lower extremity edema, vision changes, headaches. Cardiovascular exam with heart regular rate and rhythm. Normal heart sounds, no murmurs present. No lower extremity edema present. Lungs clear to auscultation bilaterally. Patient is currently taking  amlodipine-valsartan 10-320 mg daily & chlorthalidone 25mg  daily. No refills needed. Plan to follow-up with cardiology, as she is enrolled in a research study with her blood pressure.

## 2022-11-24 NOTE — Assessment & Plan Note (Signed)
Recent lipid panel completed 08/06/2022 with mixed HLD- total cholesterol 209, triglycerides 168 and LDL 138. Discussed ASCVD risk score is 3.4% at this time. Review of cardiology's note- looking to re-initiation lipid-lowering therapy. Will allow them to further manage patient.

## 2022-11-24 NOTE — Progress Notes (Signed)
Patient was unaware of NPO instructions. Patient ate 2 chicken patties before Renal appointment. Patient given option to reschedule or proceed with test knowing some pictures may not be available. Patient elected to proceed with test.

## 2022-11-24 NOTE — Progress Notes (Signed)
Established Patient Office Visit  Subjective   Patient ID: Tamara Osborne, female    DOB: 1975/11/29  Age: 47 y.o. MRN: [id number]  HYPERTENSION: NIRALI TURNBAUGH presents for the medical management of hypertension.  Patient's current hypertension medication regimen is: amlodipine-valsartan 10-320 mg & chlorthalidone 25mg  daily.  Patient is currently taking prescribed medications for HTN.  Patient is regularly keeping a check on BP at home: 111/70, 114/74, 125/75, 120/79, 114/75, 118/76. She is enrolled in a research study for her blood pressure.  Adhering to low sodium diet: yes Denies headache, dizziness, CP, SHOB, vision changes.    BP Readings from Last 3 Encounters:  11/24/22 121/62  11/10/22 132/77  10/12/22 130/88   The 10-year ASCVD risk score (Arnett DK, et al., 2019) is: 3.4%   Values used to calculate the score:     Age: 21 years     Sex: Female     Is Non-Hispanic African American: No     Diabetic: Yes     Tobacco smoker: No     Systolic Blood Pressure: 121 mmHg     Is BP treated: Yes     HDL Cholesterol: 41 mg/dL     Total Cholesterol: 209 mg/dL  Review of Systems  Constitutional:  Negative for malaise/fatigue.  Respiratory:  Negative for cough and shortness of breath.   Cardiovascular:  Negative for chest pain, palpitations and leg swelling.  Gastrointestinal:  Negative for abdominal pain, nausea and vomiting.  Musculoskeletal:  Negative for myalgias.  Neurological:  Negative for dizziness, weakness and headaches.  Psychiatric/Behavioral:  Negative for depression and suicidal ideas. The patient is not nervous/anxious and does not have insomnia.     Objective:     BP 121/62   Pulse 84   Ht 5\' 2"  (1.575 m)   Wt 228 lb (103.4 kg)   SpO2 98%   BMI 41.70 kg/m  BP Readings from Last 3 Encounters:  11/24/22 121/62  11/10/22 132/77  10/12/22 130/88     Physical Exam Constitutional:      Appearance: Normal appearance.  Cardiovascular:     Rate  and Rhythm: Normal rate and regular rhythm.     Pulses: Normal pulses.     Heart sounds: Normal heart sounds.  Pulmonary:     Effort: Pulmonary effort is normal.     Breath sounds: Normal breath sounds.  Neurological:     Mental Status: She is alert.  Psychiatric:        Mood and Affect: Mood normal.        Behavior: Behavior normal.        Thought Content: Thought content normal.        Judgment: Judgment normal.      Assessment & Plan:  Hypertension associated with type 2 diabetes mellitus (HCC) Assessment & Plan: Patient presents today with well-controlled blood pressure. Patient in no acute distress and is well-appearing. Denies chest pain, shortness of breath, lower extremity edema, vision changes, headaches. Cardiovascular exam with heart regular rate and rhythm. Normal heart sounds, no murmurs present. No lower extremity edema present. Lungs clear to auscultation bilaterally. Patient is currently taking  amlodipine-valsartan 10-320 mg daily & chlorthalidone 25mg  daily. No refills needed. Plan to follow-up with cardiology, as she is enrolled in a research study with her blood pressure.    Hyperlipidemia associated with type 2 diabetes mellitus (HCC) Assessment & Plan: Recent lipid panel completed 08/06/2022 with mixed HLD- total cholesterol 209, triglycerides 168 and LDL 138. Discussed ASCVD  risk score is 3.4% at this time. Review of cardiology's note- looking to re-initiation lipid-lowering therapy. Will allow them to further manage patient.    CKD (chronic kidney disease), symptom management only, stage 3 (moderate) (HCC) Assessment & Plan: Patient has a history of stage 3A CKD, most likely secondary to history of hypertension. Patient reports she has a renal ultrasound today and reports that she is also completing a 24-hour urine test. She was unable to complete it last week due to various appointments.   Return in about 4 months (around 03/26/2023) for Physical with fasting  labs.     Alyson Reedy, FNP

## 2022-11-24 NOTE — Assessment & Plan Note (Signed)
Patient has a history of stage 3A CKD, most likely secondary to history of hypertension. Patient reports she has a renal ultrasound today and reports that she is also completing a 24-hour urine test. She was unable to complete it last week due to various appointments.

## 2022-11-26 ENCOUNTER — Ambulatory Visit: Payer: Medicaid Other | Admitting: Physical Therapy

## 2022-11-26 ENCOUNTER — Encounter: Payer: Self-pay | Admitting: Physical Therapy

## 2022-11-26 DIAGNOSIS — R2681 Unsteadiness on feet: Secondary | ICD-10-CM

## 2022-11-26 DIAGNOSIS — M6281 Muscle weakness (generalized): Secondary | ICD-10-CM | POA: Diagnosis not present

## 2022-11-26 DIAGNOSIS — R2689 Other abnormalities of gait and mobility: Secondary | ICD-10-CM

## 2022-11-26 NOTE — Therapy (Signed)
OUTPATIENT PHYSICAL THERAPY NEURO TREATMENT   Patient Name: Tamara Osborne MRN: [id number] DOB:[birth year], 47 y.o., female Today's Date: 11/26/2022   PCP: Alyson Reedy, FNP REFERRING PROVIDER: Chilton Si, MD  END OF SESSION:  PT End of Session - 11/26/22 1101     Visit Number 4    Number of Visits 8    Date for PT Re-Evaluation 12/15/22    Authorization Type [place] Medicaid [employer]    PT Start Time 1105    PT Stop Time 1147    PT Time Calculation (min) 42 min    Activity Tolerance Patient tolerated treatment well    Behavior During Therapy WFL for tasks assessed/performed               Past Medical History:  Diagnosis Date   Anemia    Angio-edema    Anxiety    Bartholin cyst    Cervical cancer (HCC)    Chronic kidney disease    Constipation    Depression    Deviated septum    Diabetes mellitus without complication (HCC)    Difficult intravenous access    used port for 05-09-2019 surgery   Fibroids    History of blood transfusion    2 units given 04-26-2019, has had total of 8 units since jan 2021   History of blood transfusion 1 unit given 05-30-19   iv fluids also given   History of cervical cancer 07/16/2022   History of kidney problems    History of radiation therapy 03/23/2019-05/04/2019   Cervical external beam   Dr Antony Blackbird   History of radiation therapy 05/09/2019-06/05/2019   vaginal brachytherapy   Dr Antony Blackbird   History of recent blood transfusion 05/16/2019   2 units given  per dr Lambert Mody at cancer center   Hypertension    Neuropathy    both thumbs   Obesity    Palpitations    PONV (postoperative nausea and vomiting)    Prediabetes    Preeclampsia 2006   Sleep apnea    no cpap used insurance would not cover, osa severe per pt   SOB (shortness of breath)    Squamous cell carcinoma of cervix (HCC) 03/23/2019   Swallowing difficulty    Symptomatic skin lesion 07/14/2022   Vitamin D deficiency 01/10/2020   Past Surgical  History:  Procedure Laterality Date   CESAREAN SECTION WITH BILATERAL TUBAL LIGATION  11/10/2004       DILATION AND CURETTAGE OF UTERUS  1997   IR IMAGING GUIDED PORT INSERTION  04/04/2019   IR REMOVAL TUN ACCESS W/ PORT W/O FL MOD SED  10/27/2021   OPERATIVE ULTRASOUND N/A 05/09/2019   Procedure: OPERATIVE ULTRASOUND;  Surgeon: Antony Blackbird, MD;  Location: [facility];  Service: Urology;  Laterality: N/A;   OPERATIVE ULTRASOUND N/A 05/15/2019   Procedure: OPERATIVE ULTRASOUND;  Surgeon: Antony Blackbird, MD;  Location: [facility];  Service: Urology;  Laterality: N/A;   OPERATIVE ULTRASOUND N/A 05/22/2019   Procedure: OPERATIVE ULTRASOUND;  Surgeon: Antony Blackbird, MD;  Location: [facility];  Service: Urology;  Laterality: N/A;   OPERATIVE ULTRASOUND N/A 05/29/2019   Procedure: OPERATIVE ULTRASOUND;  Surgeon: Antony Blackbird, MD;  Location: [facility];  Service: Urology;  Laterality: N/A;   OPERATIVE ULTRASOUND N/A 06/05/2019   Procedure: OPERATIVE ULTRASOUND;  Surgeon: Antony Blackbird, MD;  Location: [facility];  Service: Urology;  Laterality: N/A;   TANDEM RING INSERTION N/A 05/09/2019  Procedure: TANDEM RING INSERTION;  Surgeon: Antony Blackbird, MD;  Location: [facility];  Service: Urology;  Laterality: N/A;   TANDEM RING INSERTION N/A 05/15/2019   Procedure: TANDEM RING INSERTION;  Surgeon: Antony Blackbird, MD;  Location: [facility];  Service: Urology;  Laterality: N/A;   TANDEM RING INSERTION N/A 05/22/2019   Procedure: TANDEM RING INSERTION;  Surgeon: Antony Blackbird, MD;  Location: [facility];  Service: Urology;  Laterality: N/A;   TANDEM RING INSERTION N/A 05/29/2019   Procedure: TANDEM RING INSERTION;  Surgeon: Antony Blackbird, MD;  Location: [facility];  Service: Urology;  Laterality: N/A;   TANDEM RING INSERTION N/A 06/05/2019   Procedure: TANDEM RING INSERTION;   Surgeon: Antony Blackbird, MD;  Location: [facility];  Service: Urology;  Laterality: N/A;   Patient Active Problem List   Diagnosis Date Noted   HSV-2 infection 07/13/2022   Dyspareunia in female 10/23/2021   Hyperlipidemia associated with type 2 diabetes mellitus (HCC) 07/03/2021   OSA (obstructive sleep apnea) 07/31/2020   Other hyperlipidemia 01/10/2020   Insulin resistance 01/10/2020   Class 2 severe obesity with serious comorbidity and body mass index (BMI) of 39.0 to 39.9 in adult [facility]) 01/10/2020   Peripheral 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) 04/25/2019   CKD (chronic kidney disease), symptom management only, stage 3 (moderate) (HCC) 03/30/2019   Generalized anxiety disorder 03/30/2019   Hypertension associated with type 2 diabetes mellitus (HCC)    Deficiency anemia 03/22/2019    ONSET DATE: 2 years  REFERRING DIAG: G62.9 (ICD-10-CM) - Neuropathy R26.89 (ICD-10-CM) - Balance problem  THERAPY DIAG:  Muscle weakness (generalized)  Unsteadiness on feet  Other abnormalities of gait and mobility  Rationale for Evaluation and Treatment: Rehabilitation  SUBJECTIVE:                                                                                                                                                                                             SUBJECTIVE STATEMENT: Had to cancel last PT visit and last aquatic visit the last couple of weeks due to stomach flu-type illness.  Pt accompanied by: self  PERTINENT HISTORY: 47 y.o. female with a hx of hypertension, preeclampsia, hyperlipidemia, OSA, diabetes, squamous cell carcinoma of cervix s/p radiation and chemotherapy, morbid obesity. For exercise she was previously in aquatic therapy, now participates in Silver sneakers. She states that every 6 months she has recurring issues with capsulitis/paronychia in her feet which has affected her physical activity. She follows with her podiatrist  PAIN:  Are you  having pain?  Not much pain, just numbness  PRECAUTIONS: None  RED FLAGS: None   WEIGHT BEARING RESTRICTIONS: No  FALLS: Has patient fallen in last 6 months? No  LIVING ENVIRONMENT: Lives with: lives with their partner Lives in: House/apartment Stairs:  split foyer, 6 steps up/down, bilat HR Has following equipment at home: None  PLOF: Independent  PATIENT GOALS:   OBJECTIVE:    TODAY'S TREATMENT: 11/26/2022 Activity Comments  Reviewed HEP -sidelying hip abduction -supine bridging -supine hip stretch Pt return demo understanding; revised stretch to pull inward for more gentle stretch  Sidelying clamshell 10 reps Some pain in buttocks  Supine resisted clamshell, 10 reps x 2 Good form  Sit<>Stand 2 x 5 reps with green band around knees           Access Code: [email or web address] URL: [email or web address] Date: 11/26/2022 Prepared by: [facility] - [facility]  Exercises - Seated Ankle Dorsiflexion with Resistance  - 1 x daily - 7 x weekly - 3 sets - 10 reps - Standing Balance in Corner  - 1 x daily - 7 x weekly - 1 sets - 5 reps - Standing Balance in Corner with Eyes Closed  - 1 x daily - 7 x weekly - 1 sets - 5 reps - Sidelying Hip Abduction  - 1 x daily - 5 x weekly - 3 sets - 10 reps - Supine Bridge  - 1 x daily - 5 x weekly - 3 sets - 10 reps - 3 sec hold - Hooklying Clamshell with Resistance  - 1 x daily - 7 x weekly - 3 sets - 10 reps - Hooklying Single Knee to Chest Stretch  - 1 x daily - 7 x weekly - 1 sets - 5 reps - 15 sec hold     PATIENT EDUCATION: (SELF CARE) Education details: HEP additions; R hip weakness contributing to gait pattern; aquatics appointment/directions/water shoes.  Discussed POC, given pt's several missed appointments due to foot issues and illness Person educated: Patient Education method: Explanation, Demonstration, and Handouts Education comprehension: verbalized understanding, returned demonstration,  and needs further education  ---------------------------------------------------------------------------- Objective measures below taken at initial evaluation:  DIAGNOSTIC FINDINGS: N/A for episode  COGNITION: Overall cognitive status:  WFL, pt reports some memory deficits since chemotherapy   SENSATION: Light touch: Impaired  dorsum and plantar surfaces bilateral feet  COORDINATION: WFL  EDEMA:  Some swelling in bilat feet appreciated  MUSCLE TONE: WNL     POSTURE: No Significant postural limitations  LOWER EXTREMITY ROM:     Active  Right Eval Left Eval  Hip flexion    Hip extension    Hip abduction    Hip adduction    Hip internal rotation    Hip external rotation    Knee flexion 115 115  Knee extension 0 0  Ankle dorsiflexion 15 15  Ankle plantarflexion 30 30  Ankle inversion    Ankle eversion     (Blank rows = not tested)  LOWER EXTREMITY MMT:    MMT Right Eval Left Eval  Hip flexion 5 5  Hip extension    Hip abduction    Hip adduction    Hip internal rotation    Hip external rotation    Knee flexion 5 5  Knee extension 5 5  Ankle dorsiflexion 3 4  Ankle plantarflexion 4 4  Ankle inversion    Ankle eversion    (Blank rows = not tested)  BED MOBILITY:  indep  TRANSFERS: Assistive device utilized: None  Sit  to stand: Complete Independence Stand to sit: Complete Independence Chair to chair: Complete Independence Floor:  NT    CURB:  Level of Assistance: Modified independence Assistive device utilized: None Curb Comments: increased time to set body position  STAIRS: Level of Assistance: Modified independence Stair Negotiation Technique: Step to Pattern Alternating Pattern  with Bilateral Rails Number of Stairs: 12  Height of Stairs: 4-6  Comments:   GAIT: Gait pattern: decreased stride length and antalgic Distance walked:  Assistive device utilized: None Level of assistance: Complete Independence Comments:   FUNCTIONAL  TESTS:  5 times sit to stand: 14.53 sec Timed up and go (TUG): NT Berg Balance Scale: 49/56  M-CTSIB  Condition 1: Firm Surface, EO 30 Sec, Normal Sway  Condition 2: Firm Surface, EC 30 Sec, Mild and Moderate Sway  Condition 3: Foam Surface, EO 30 Sec, Mild Sway  Condition 4: Foam Surface, EC 30 Sec, Mild and Moderate Sway       PATIENT EDUCATION: Education details: assessment details, rationale of PT intervention Person educated: Patient Education method: Explanation Education comprehension: verbalized understanding  HOME EXERCISE PROGRAM: TBD  GOALS: Goals reviewed with patient? Yes  SHORT TERM GOALS: Target date: 11/17/2022    Patient will be independent in HEP to improve functional outcomes Baseline: Goal status: MET, 11/26/2022  2.  Demo improved balance and reduce risk for falls per score 52/56 Berg Balance Test Baseline: 49/56> Goal status: IN PROGRESS (not assessed 9/26 as pt has not been in since 11/05/2022)  3.  Demo improved ambulation tolerance as evidenced by distance of 1,500 ft during  Baseline: initial 630 ft 11/05/2022  Goal status:NOT MET 11/26/2022  LONG TERM GOALS: Target date: 12/15/2022    Patient to be independent with advanced HEP to include land/aquatic activities as indicated Baseline:  Goal status: IN PROGRESS  2.  Demo 4+/5 bilateral ankle dorsiflexion and plantarflexion strength for improved stability Baseline: see above Goal status: IN PROGRESS  3.  Patient to report absent right thigh numbness for improved comfort Baseline: daily, with prolonged standing Goal status: IN PROGRESS    ASSESSMENT:  CLINICAL IMPRESSION: Pt arrives today for PT session, reporting she has had to cancel last land and aquatic PT appointment due to foot issue and then stomach virus.  STG 1 met, as pt is performing HEP well.  STG 2 and 3 not fully assessed, due to pt's 1x/wk frequency and missed appointments.  PT chose to spend time today on review and  progression of HEP for hip strengthening.  She plans to attend scheduled aquatic therapy appointment next week.  She will benefit from further skilled PT towards goals for improved strength, balance, gait.    OBJECTIVE IMPAIRMENTS: Abnormal gait, decreased activity tolerance, decreased balance, difficulty walking, decreased strength, and impaired sensation.   ACTIVITY LIMITATIONS: standing, stairs, and locomotion level  PARTICIPATION LIMITATIONS: cleaning and community activity  PERSONAL FACTORS: Time since onset of injury/illness/exacerbation and 1-2 comorbidities: PMH  are also affecting patient's functional outcome.   REHAB POTENTIAL: Good  CLINICAL DECISION MAKING: Evolving/moderate complexity  EVALUATION COMPLEXITY: Moderate  PLAN:  PT FREQUENCY: 1x/week  PT DURATION: 8 weeks  PLANNED INTERVENTIONS: Therapeutic exercises, Therapeutic activity, Neuromuscular re-education, Balance training, Gait training, Patient/Family education, Self Care, Joint mobilization, Stair training, Vestibular training, Canalith repositioning, DME instructions, Aquatic Therapy, Dry Needling, Electrical stimulation, Spinal mobilization, Cryotherapy, Moist heat, Taping, and Manual therapy  PLAN FOR NEXT SESSION: To go to Aquatics next week (10/3).  For Land Based therapy appt 10/10:  need to assess goals and update POC (pt has missed several appointments due to foot issue and illness and would benefit from continuing beyond initial POC).  Review HEP additions from this visit; further hip abductor strengthening, glut strengthening, standing balance exercises  AQUATICS:  Address strength in lower extremities, balance  Lonia Blood, PT 11/26/22 5:14 PM Phone: [phone] Fax: [phone]  [facility] at [facility] [address] [place], [place] [address] Phone # [phone] Fax # [phone]

## 2022-12-01 ENCOUNTER — Telehealth: Payer: Self-pay | Admitting: Podiatry

## 2022-12-01 NOTE — Telephone Encounter (Signed)
Pt called and she is in physical therapy and they are wanting to do water therapy. She stes her toe that the  ingrown was removed is still a little red and sensitive. Should she wait to go into the water?

## 2022-12-02 NOTE — Telephone Encounter (Signed)
Fine for her to do

## 2022-12-02 NOTE — Telephone Encounter (Signed)
Notified pt that Dr Charlsie Merles said it was fine.  She said thanks for getting back to her. She just wanted to make sure and did not want to cxl therapy if she did not need to.

## 2022-12-02 NOTE — Telephone Encounter (Signed)
Pt called again today asking about getting in water for therapy with her toe that was infected about 2 to 3 wks ago. She needs to know asap because she has to cancel today to avoid charges. Please advise.

## 2022-12-03 ENCOUNTER — Ambulatory Visit: Payer: Medicaid Other | Attending: Cardiovascular Disease | Admitting: Physical Therapy

## 2022-12-03 DIAGNOSIS — M6281 Muscle weakness (generalized): Secondary | ICD-10-CM | POA: Diagnosis present

## 2022-12-03 DIAGNOSIS — R262 Difficulty in walking, not elsewhere classified: Secondary | ICD-10-CM | POA: Diagnosis present

## 2022-12-03 DIAGNOSIS — R2681 Unsteadiness on feet: Secondary | ICD-10-CM | POA: Diagnosis present

## 2022-12-03 DIAGNOSIS — R2689 Other abnormalities of gait and mobility: Secondary | ICD-10-CM | POA: Insufficient documentation

## 2022-12-07 ENCOUNTER — Encounter: Payer: Self-pay | Admitting: Physical Therapy

## 2022-12-07 ENCOUNTER — Ambulatory Visit (INDEPENDENT_AMBULATORY_CARE_PROVIDER_SITE_OTHER): Payer: Medicaid Other | Admitting: Adult Health

## 2022-12-07 ENCOUNTER — Encounter (INDEPENDENT_AMBULATORY_CARE_PROVIDER_SITE_OTHER): Payer: Self-pay | Admitting: Adult Health

## 2022-12-07 VITALS — BP 117/77 | HR 84 | Temp 98.0°F | Ht 62.0 in | Wt 228.0 lb

## 2022-12-07 DIAGNOSIS — Z7985 Long-term (current) use of injectable non-insulin antidiabetic drugs: Secondary | ICD-10-CM

## 2022-12-07 DIAGNOSIS — E669 Obesity, unspecified: Secondary | ICD-10-CM

## 2022-12-07 DIAGNOSIS — N1832 Chronic kidney disease, stage 3b: Secondary | ICD-10-CM | POA: Diagnosis not present

## 2022-12-07 DIAGNOSIS — I152 Hypertension secondary to endocrine disorders: Secondary | ICD-10-CM

## 2022-12-07 DIAGNOSIS — E1159 Type 2 diabetes mellitus with other circulatory complications: Secondary | ICD-10-CM | POA: Diagnosis not present

## 2022-12-07 DIAGNOSIS — Z6841 Body Mass Index (BMI) 40.0 and over, adult: Secondary | ICD-10-CM

## 2022-12-07 DIAGNOSIS — E1122 Type 2 diabetes mellitus with diabetic chronic kidney disease: Secondary | ICD-10-CM | POA: Diagnosis not present

## 2022-12-07 MED ORDER — SEMAGLUTIDE (1 MG/DOSE) 4 MG/3ML ~~LOC~~ SOPN: 1.0000 mg | PEN_INJECTOR | SUBCUTANEOUS | 0 refills | Status: DC

## 2022-12-07 NOTE — Progress Notes (Signed)
WEIGHT SUMMARY AND BIOMETRICS  Vitals Temp: 98 F (36.7 C) BP: 117/77 Pulse Rate: 84 SpO2: 96 %   Anthropometric Measurements Height: 5\' 2"  (1.575 m) Weight: 228 lb (103.4 kg) BMI (Calculated): 41.69 Weight at Last Visit: 226lb Weight Lost Since Last Visit: 2lb Weight Gained Since Last Visit: 0 Starting Weight: 208lb Total Weight Loss (lbs): 0 lb (0 kg)   Body Composition  Body Fat %: 46.3 % Fat Mass (lbs): 105.6 lbs Muscle Mass (lbs): 116.2 lbs Total Body Water (lbs): 89 lbs Visceral Fat Rating : 14   Other Clinical Data Fasting: no Labs: no Today's Visit #: 80 Starting Date: 09/20/19    Chief Complaint:   OBESITY Tamara Osborne is here to discuss her progress with her obesity treatment plan. She is on the keeping a food journal and adhering to recommended goals of 1450-1550 calories and 95 protein and states she is following her eating plan approximately 90 % of the time. She states she is exercising Physical Therapy.   Interim History:  She is participating in [facility] HTN study through the HTN CLINIC  BP at goal at OV!  She will experience fatigue and activity intolerance with prolonged exertion (ie: walking in [employer]). She denies CP with exertion. She denies tobacco/vape use.  She is VERY excited to start undergraduate courses at [employer]- [employer] degree. Dec 14, 2022 courses begin- major undeclared.  Subjective:   1. Hypertension associated with type 2 diabetes mellitus (HCC) BP excellent at OV She is currently Amlodipine/Valsartan 10/320mg  daily and Chlorthalidone 25mg  daily She is participating in [facility] HTN study through the HTN CLINIC  Per pt- when she picked up last refill of Amlodipine/Valsartan- she was provided a generic supply. Last 3 weeks her ambulatory readings have been SBP 110s DBP 60-70s  2. Type 2 diabetes mellitus with stage 3b chronic kidney disease, without long-term current use of insulin  (HCC) Fasting CBG - NOT CHECKING AT HOME She denies sx's of hypoglycemia. She is currently on weekly Ozempic 1mg  Denies mass in neck, dysphagia, dyspepsia, persistent hoarseness, abdominal pain, or N/V/C   Assessment/Plan:   1. Hypertension associated with type 2 diabetes mellitus (HCC) Continue Amlodipine/Valsartan 10/320mg  daily and Chlorthalidone 25mg  daily. Continue BP monitoring and f'/u with HTN clinic  2. Type 2 diabetes mellitus with stage 3b chronic kidney disease, without long-term current use of insulin (HCC) Refill - Semaglutide, 1 MG/DOSE, 4 MG/3ML SOPN; Inject 1 mg as directed once a week.  Dispense: 3 mL; Refill: 0  3. BMI 40.0-44.9, adult (HCC), Current BMI 41.69  Tamara Osborne is currently in the action stage of change. As such, her goal is to continue with weight loss efforts. She has agreed to keeping a food journal and adhering to recommended goals of 1450-1550 calories and 95 protein.   Exercise goals: All adults should avoid inactivity. Some physical activity is better than none, and adults who participate in any amount of physical activity gain some health benefits. Adults should also include muscle-strengthening activities that involve all major muscle groups on 2 or more days a week.  Behavioral modification strategies: increasing lean protein intake, decreasing simple carbohydrates, increasing vegetables, increasing water intake, meal planning and cooking strategies, keeping healthy foods in the home, ways to avoid boredom eating, ways to avoid night time snacking, avoiding temptations, planning for success, and keeping a strict food journal.  Tamara Osborne has agreed to follow-up with our clinic in 4 weeks. She was informed of the importance of frequent  follow-up visits to maximize her success with intensive lifestyle modifications for her multiple health conditions.   Check Fasting Labs this fall 2024  Objective:   Blood pressure 117/77, pulse 84, temperature 98 F  (36.7 C), height 5\' 2"  (1.575 m), weight 228 lb (103.4 kg), SpO2 96%. Body mass index is 41.7 kg/m.  General: Cooperative, alert, well developed, in no acute distress. HEENT: Conjunctivae and lids unremarkable. Cardiovascular: Regular rhythm.  Lungs: Normal work of breathing. Neurologic: No focal deficits.   Lab Results  Component Value Date   CREATININE 1.50 (H) 08/06/2022   BUN 23 08/06/2022   NA 140 08/06/2022   K 4.9 08/06/2022   CL 102 08/06/2022   CO2 20 08/06/2022   Lab Results  Component Value Date   ALT 48 (H) 08/06/2022   AST 27 08/06/2022   ALKPHOS 97 08/06/2022   BILITOT 0.3 08/06/2022   Lab Results  Component Value Date   HGBA1C 5.9 (H) 08/06/2022   HGBA1C 5.3 07/16/2022   HGBA1C 5.4 06/05/2021   HGBA1C 5.2 01/13/2021   HGBA1C 7.5 (H) 10/18/2020   Lab Results  Component Value Date   INSULIN 40.3 (H) 09/20/2019   Lab Results  Component Value Date   TSH 1.850 08/06/2022   Lab Results  Component Value Date   CHOL 209 (H) 08/06/2022   HDL 41 08/06/2022   LDLCALC 138 (H) 08/06/2022   TRIG 168 (H) 08/06/2022   CHOLHDL 5.1 (H) 08/06/2022   Lab Results  Component Value Date   VD25OH 66.26 06/05/2021   VD25OH 59.37 10/18/2020   VD25OH 45.60 05/20/2020   Lab Results  Component Value Date   WBC 9.3 08/06/2022   HGB 15.3 08/06/2022   HCT 45.3 08/06/2022   MCV 83 08/06/2022   PLT 326 08/06/2022   Lab Results  Component Value Date   IRON 17 (L) 03/27/2019   TIBC 283 03/27/2019   FERRITIN 32 03/27/2019   Attestation Statements:   Reviewed by clinician on day of visit: allergies, medications, problem list, medical history, surgical history, family history, social history, and previous encounter notes.  I have reviewed the above documentation for accuracy and completeness, and I agree with the above. -  Rosell Khouri d. Kenetra Hildenbrand,  NP-C

## 2022-12-07 NOTE — Therapy (Signed)
OUTPATIENT PHYSICAL THERAPY NEURO TREATMENT   Patient Name: Tamara Osborne MRN: [id number] DOB:1975-05-24, 47 y.o., female Today's Date: 12/07/2022   PCP: Alyson Reedy, FNP REFERRING PROVIDER: Chilton Si, MD  END OF SESSION:   12/03/22 1100  PT Visits / Re-Eval  Visit Number 5  Number of Visits 8  Date for PT Re-Evaluation 12/15/22  Authorization  Authorization Type [place] Medicaid [employer]  PT Time Calculation  PT Start Time 1100  PT Stop Time 1146  PT Time Calculation (min) 46 min  PT - End of Session  Equipment Utilized During Treatment Other (comment) (multicolor low resistance dumbbell)  Activity Tolerance Patient tolerated treatment well  Behavior During Therapy WFL for tasks assessed/performed      Past Medical History:  Diagnosis Date   Anemia    Angio-edema    Anxiety    Bartholin cyst    Cervical cancer (HCC)    Chronic kidney disease    Constipation    Depression    Deviated septum    Diabetes mellitus without complication (HCC)    Difficult intravenous access    used port for 05-09-2019 surgery   Fibroids    History of blood transfusion    2 units given 04-26-2019, has had total of 8 units since jan 2021   History of blood transfusion 1 unit given 05-30-19   iv fluids also given   History of cervical cancer 07/16/2022   History of kidney problems    History of radiation therapy 03/23/2019-05/04/2019   Cervical external beam   Dr Antony Blackbird   History of radiation therapy 05/09/2019-06/05/2019   vaginal brachytherapy   Dr Antony Blackbird   History of recent blood transfusion 05/16/2019   2 units given  per dr Lambert Mody at cancer center   Hypertension    Neuropathy    both thumbs   Obesity    Palpitations    PONV (postoperative nausea and vomiting)    Prediabetes    Preeclampsia 2006   Sleep apnea    no cpap used insurance would not cover, osa severe per pt   SOB (shortness of breath)    Squamous cell carcinoma of cervix (HCC) 03/23/2019    Swallowing difficulty    Symptomatic skin lesion 07/14/2022   Vitamin D deficiency 01/10/2020   Past Surgical History:  Procedure Laterality Date   CESAREAN SECTION WITH BILATERAL TUBAL LIGATION  11/10/2004       DILATION AND CURETTAGE OF UTERUS  1997   IR IMAGING GUIDED PORT INSERTION  04/04/2019   IR REMOVAL TUN ACCESS W/ PORT W/O FL MOD SED  10/27/2021   OPERATIVE ULTRASOUND N/A 05/09/2019   Procedure: OPERATIVE ULTRASOUND;  Surgeon: Antony Blackbird, MD;  Location: [facility];  Service: Urology;  Laterality: N/A;   OPERATIVE ULTRASOUND N/A 05/15/2019   Procedure: OPERATIVE ULTRASOUND;  Surgeon: Antony Blackbird, MD;  Location: [facility];  Service: Urology;  Laterality: N/A;   OPERATIVE ULTRASOUND N/A 05/22/2019   Procedure: OPERATIVE ULTRASOUND;  Surgeon: Antony Blackbird, MD;  Location: [facility];  Service: Urology;  Laterality: N/A;   OPERATIVE ULTRASOUND N/A 05/29/2019   Procedure: OPERATIVE ULTRASOUND;  Surgeon: Antony Blackbird, MD;  Location: [facility];  Service: Urology;  Laterality: N/A;   OPERATIVE ULTRASOUND N/A 06/05/2019   Procedure: OPERATIVE ULTRASOUND;  Surgeon: Antony Blackbird, MD;  Location: [facility];  Service: Urology;  Laterality: N/A;   TANDEM RING INSERTION N/A 05/09/2019   Procedure: TANDEM RING  INSERTION;  Surgeon: Antony Blackbird, MD;  Location: [facility];  Service: Urology;  Laterality: N/A;   TANDEM RING INSERTION N/A 05/15/2019   Procedure: TANDEM RING INSERTION;  Surgeon: Antony Blackbird, MD;  Location: [facility];  Service: Urology;  Laterality: N/A;   TANDEM RING INSERTION N/A 05/22/2019   Procedure: TANDEM RING INSERTION;  Surgeon: Antony Blackbird, MD;  Location: [facility];  Service: Urology;  Laterality: N/A;   TANDEM RING INSERTION N/A 05/29/2019   Procedure: TANDEM RING INSERTION;  Surgeon: Antony Blackbird, MD;  Location: [facility];   Service: Urology;  Laterality: N/A;   TANDEM RING INSERTION N/A 06/05/2019   Procedure: TANDEM RING INSERTION;  Surgeon: Antony Blackbird, MD;  Location: [facility];  Service: Urology;  Laterality: N/A;   Patient Active Problem List   Diagnosis Date Noted   HSV-2 infection 07/13/2022   Dyspareunia in female 10/23/2021   Hyperlipidemia associated with type 2 diabetes mellitus (HCC) 07/03/2021   OSA (obstructive sleep apnea) 07/31/2020   Other hyperlipidemia 01/10/2020   Insulin resistance 01/10/2020   Class 2 severe obesity with serious comorbidity and body mass index (BMI) of 39.0 to 39.9 in adult [facility]) 01/10/2020   Peripheral 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) 04/25/2019   CKD (chronic kidney disease), symptom management only, stage 3 (moderate) (HCC) 03/30/2019   Generalized anxiety disorder 03/30/2019   Hypertension associated with type 2 diabetes mellitus (HCC)    Deficiency anemia 03/22/2019    ONSET DATE: 2 years  REFERRING DIAG: G62.9 (ICD-10-CM) - Neuropathy R26.89 (ICD-10-CM) - Balance problem  THERAPY DIAG:  Muscle weakness (generalized)  Unsteadiness on feet  Other abnormalities of gait and mobility  Difficulty in walking, not elsewhere classified  Rationale for Evaluation and Treatment: Rehabilitation  SUBJECTIVE:                                                                                                                                                                                             SUBJECTIVE STATEMENT: Pt apologizes for missing visits previously.  She is not having pain today, but recounts some medical history and issues with her feet.  She reports having numbness in bilateral feet, mostly the toes.  Pt accompanied by: self  PERTINENT HISTORY: 47 y.o. female with a hx of hypertension, preeclampsia, hyperlipidemia, OSA, diabetes, squamous cell carcinoma of cervix s/p radiation and chemotherapy, morbid obesity. For exercise she was  previously in aquatic therapy, now participates in Silver sneakers. She states that every 6 months she has recurring issues with capsulitis/paronychia in her feet which has affected her physical activity. She  follows with her podiatrist  PAIN:  Are you having pain?  Not much pain, just numbness  PRECAUTIONS: None  RED FLAGS: None   WEIGHT BEARING RESTRICTIONS: No  FALLS: Has patient fallen in last 6 months? No  LIVING ENVIRONMENT: Lives with: lives with their partner Lives in: House/apartment Stairs:  split foyer, 6 steps up/down, bilat HR Has following equipment at home: None  PLOF: Independent  PATIENT GOALS:   OBJECTIVE:    TODAY'S TREATMENT: 12/03/2022 Aquatic therapy at [facility] - pool temperature 92 degrees   Patient seen for aquatic therapy today.  Treatment took place in water 3.6-4.8 feet deep depending upon activity.  Patient entered and exited the pool via stairs using reciprocal pattern at mod I level.   Exercises: Warmup:  water walking 4x18 ft forwards, backwards, and laterally without floatation assistance  Standing heel raises using step and rails for support 2x12 Squats using wall support x15 Seated on steps sciatic nerve glides x20 each LE w/ PT providing cuing and light blocking of contralateral LE to prevent poor posture or excessive buoyancy of water pulling pt out of position Walking march 4x18 ft no floatation device Lateral stepping w/ abduction/adduction using light resistance multicolor dumbbell STS 2x15 Lateral lunges w/ wall support x16 alternating sides Standing feet together:  dumbbell flutter > shoulder flexion/extension > shoulder abduction/adduction x2 minute each  Patient requires buoyancy of the water for support for reduced fall risk with gait training and balance exercises with mod I to SBA support. Exercises able to be performed safely in water without the risk of fall compared to those same exercises performed on land; viscosity of  water needed for resistance for strengthening. Current of water provides perturbations for challenging static and dynamic balance.    PATIENT EDUCATION: (SELF CARE) Education details: HEP additions; R hip weakness contributing to gait pattern; aquatics appointment/directions/water shoes.  Discussed POC, given pt's several missed appointments due to foot issues and illness Person educated: Patient Education method: Explanation, Demonstration, and Handouts Education comprehension: verbalized understanding, returned demonstration, and needs further education  ---------------------------------------------------------------------------- Objective measures below taken at initial evaluation:  DIAGNOSTIC FINDINGS: N/A for episode  COGNITION: Overall cognitive status:  WFL, pt reports some memory deficits since chemotherapy   SENSATION: Light touch: Impaired  dorsum and plantar surfaces bilateral feet  COORDINATION: WFL  EDEMA:  Some swelling in bilat feet appreciated  MUSCLE TONE: WNL     POSTURE: No Significant postural limitations  LOWER EXTREMITY ROM:     Active  Right Eval Left Eval  Hip flexion    Hip extension    Hip abduction    Hip adduction    Hip internal rotation    Hip external rotation    Knee flexion 115 115  Knee extension 0 0  Ankle dorsiflexion 15 15  Ankle plantarflexion 30 30  Ankle inversion    Ankle eversion     (Blank rows = not tested)  LOWER EXTREMITY MMT:    MMT Right Eval Left Eval  Hip flexion 5 5  Hip extension    Hip abduction    Hip adduction    Hip internal rotation    Hip external rotation    Knee flexion 5 5  Knee extension 5 5  Ankle dorsiflexion 3 4  Ankle plantarflexion 4 4  Ankle inversion    Ankle eversion    (Blank rows = not tested)  BED MOBILITY:  indep  TRANSFERS: Assistive device utilized: None  Sit to stand: Complete Independence  Stand to sit: Complete Independence Chair to chair: Complete  Independence Floor:  NT    CURB:  Level of Assistance: Modified independence Assistive device utilized: None Curb Comments: increased time to set body position  STAIRS: Level of Assistance: Modified independence Stair Negotiation Technique: Step to Pattern Alternating Pattern  with Bilateral Rails Number of Stairs: 12  Height of Stairs: 4-6  Comments:   GAIT: Gait pattern: decreased stride length and antalgic Distance walked:  Assistive device utilized: None Level of assistance: Complete Independence Comments:   FUNCTIONAL TESTS:  5 times sit to stand: 14.53 sec Timed up and go (TUG): NT Berg Balance Scale: 49/56  M-CTSIB  Condition 1: Firm Surface, EO 30 Sec, Normal Sway  Condition 2: Firm Surface, EC 30 Sec, Mild and Moderate Sway  Condition 3: Foam Surface, EO 30 Sec, Mild Sway  Condition 4: Foam Surface, EC 30 Sec, Mild and Moderate Sway       PATIENT EDUCATION: Education details: assessment details, rationale of PT intervention Person educated: Patient Education method: Explanation Education comprehension: verbalized understanding  HOME EXERCISE PROGRAM: Access Code: [email or web address] URL: [email or web address] Date: 11/26/2022 Prepared by: [facility] - Outpatient  Rehab - [facility]  Exercises - Seated Ankle Dorsiflexion with Resistance  - 1 x daily - 7 x weekly - 3 sets - 10 reps - Standing Balance in Corner  - 1 x daily - 7 x weekly - 1 sets - 5 reps - Standing Balance in Corner with Eyes Closed  - 1 x daily - 7 x weekly - 1 sets - 5 reps - Sidelying Hip Abduction  - 1 x daily - 5 x weekly - 3 sets - 10 reps - Supine Bridge  - 1 x daily - 5 x weekly - 3 sets - 10 reps - 3 sec hold - Hooklying Clamshell with Resistance  - 1 x daily - 7 x weekly - 3 sets - 10 reps - Hooklying Single Knee to Chest Stretch  - 1 x daily - 7 x weekly - 1 sets - 5 reps - 15 sec hold  GOALS: Goals reviewed with patient? Yes  SHORT TERM GOALS: Target date:  11/17/2022    Patient will be independent in HEP to improve functional outcomes Baseline: Goal status: MET, 11/26/2022  2.  Demo improved balance and reduce risk for falls per score 52/56 Berg Balance Test Baseline: 49/56> Goal status: IN PROGRESS (not assessed 9/26 as pt has not been in since 11/05/2022)  3.  Demo improved ambulation tolerance as evidenced by distance of 1,500 ft during  Baseline: initial 630 ft 11/05/2022  Goal status:NOT MET 11/26/2022  LONG TERM GOALS: Target date: 12/15/2022    Patient to be independent with advanced HEP to include land/aquatic activities as indicated Baseline:  Goal status: IN PROGRESS  2.  Demo 4+/5 bilateral ankle dorsiflexion and plantarflexion strength for improved stability Baseline: see above Goal status: IN PROGRESS  3.  Patient to report absent right thigh numbness for improved comfort Baseline: daily, with prolonged standing Goal status: IN PROGRESS    ASSESSMENT:  CLINICAL IMPRESSION: Pt seen for aquatic therapy interventions today with good performance of tasks in setting with no effect noted on numbness in bilateral toes.  She demonstrates improved tolerance to strengthening tasks and would likely benefit from an aquatic HEP in future visits.  Will continue per POC as plan if for pt to be reassessed at next land appt.    OBJECTIVE IMPAIRMENTS: Abnormal gait, decreased activity tolerance,  decreased balance, difficulty walking, decreased strength, and impaired sensation.   ACTIVITY LIMITATIONS: standing, stairs, and locomotion level  PARTICIPATION LIMITATIONS: cleaning and community activity  PERSONAL FACTORS: Time since onset of injury/illness/exacerbation and 1-2 comorbidities: PMH  are also affecting patient's functional outcome.   REHAB POTENTIAL: Good  CLINICAL DECISION MAKING: Evolving/moderate complexity  EVALUATION COMPLEXITY: Moderate  PLAN:  PT FREQUENCY: 1x/week  PT DURATION: 8 weeks  PLANNED  INTERVENTIONS: Therapeutic exercises, Therapeutic activity, Neuromuscular re-education, Balance training, Gait training, Patient/Family education, Self Care, Joint mobilization, Stair training, Vestibular training, Canalith repositioning, DME instructions, Aquatic Therapy, Dry Needling, Electrical stimulation, Spinal mobilization, Cryotherapy, Moist heat, Taping, and Manual therapy  PLAN FOR NEXT SESSION:  For Land Based therapy appt 10/10:  need to assess goals and update POC (pt has missed several appointments due to foot issue and illness and would benefit from continuing beyond initial POC).  Review HEP additions from this visit; further hip abductor strengthening, glut strengthening, standing balance exercises  AQUATICS:  Aquatic HEP?  Does pt need list of community pools?  Camille Bal, PT, DPT

## 2022-12-08 ENCOUNTER — Ambulatory Visit (INDEPENDENT_AMBULATORY_CARE_PROVIDER_SITE_OTHER): Payer: Medicaid Other | Admitting: Adult Health

## 2022-12-09 LAB — BASIC METABOLIC PANEL
BUN: 32 — AB (ref 4–21)
Chloride: 103 (ref 99–108)
Creatinine: 1.6 — AB (ref 0.5–1.1)
Glucose: 129
Potassium: 4.3 meq/L (ref 3.5–5.1)
Sodium: 140 (ref 137–147)

## 2022-12-09 LAB — COMPREHENSIVE METABOLIC PANEL
Albumin: 4.4 (ref 3.5–5.0)
Calcium: 10.2 (ref 8.7–10.7)
eGFR: 40

## 2022-12-10 ENCOUNTER — Encounter (HOSPITAL_BASED_OUTPATIENT_CLINIC_OR_DEPARTMENT_OTHER): Payer: Self-pay | Admitting: Family

## 2022-12-10 ENCOUNTER — Ambulatory Visit (HOSPITAL_BASED_OUTPATIENT_CLINIC_OR_DEPARTMENT_OTHER): Payer: Medicaid Other | Admitting: Family

## 2022-12-10 ENCOUNTER — Ambulatory Visit: Payer: Medicaid Other

## 2022-12-10 ENCOUNTER — Encounter (HOSPITAL_BASED_OUTPATIENT_CLINIC_OR_DEPARTMENT_OTHER): Payer: Self-pay

## 2022-12-10 VITALS — BP 134/82 | HR 84 | Ht 61.0 in | Wt 232.3 lb

## 2022-12-10 DIAGNOSIS — M6281 Muscle weakness (generalized): Secondary | ICD-10-CM | POA: Diagnosis not present

## 2022-12-10 DIAGNOSIS — R262 Difficulty in walking, not elsewhere classified: Secondary | ICD-10-CM

## 2022-12-10 DIAGNOSIS — R2689 Other abnormalities of gait and mobility: Secondary | ICD-10-CM

## 2022-12-10 DIAGNOSIS — R2681 Unsteadiness on feet: Secondary | ICD-10-CM

## 2022-12-10 LAB — LAB REPORT - SCANNED
Creatinine, POC: 103.5 mg/dL
EGFR: 40

## 2022-12-10 NOTE — Therapy (Addendum)
OUTPATIENT PHYSICAL THERAPY NEURO TREATMENT, Progress Note, Recertification   Patient Name: Tamara Osborne MRN: [id number] DOB:1975/05/11, 47 y.o., female Today's Date: 12/10/2022   PCP: Alyson Reedy, FNP REFERRING PROVIDER: Chilton Si, MD Progress Note Reporting Period 10/20/22 to 12/10/22  See note below for Objective Data and Assessment of Progress/Goals.      END OF SESSION:  PT End of Session - 12/10/22 1101     Visit Number 6    Number of Visits 14    Date for PT Re-Evaluation 02/04/23    Authorization Type [place] [employer]    PT Start Time 1100    PT Stop Time 1145    PT Time Calculation (min) 45 min    Equipment Utilized During Treatment Other (comment)   multicolor low resistance dumbbell   Activity Tolerance Patient tolerated treatment well    Behavior During Therapy WFL for tasks assessed/performed                Past Medical History:  Diagnosis Date   Anemia    Angio-edema    Anxiety    Bartholin cyst    Cervical cancer (HCC)    Chronic kidney disease    Constipation    Depression    Deviated septum    Diabetes mellitus without complication (HCC)    Difficult intravenous access    used port for 05-09-2019 surgery   Fibroids    History of blood transfusion    2 units given 04-26-2019, has had total of 8 units since jan 2021   History of blood transfusion 1 unit given 05-30-19   iv fluids also given   History of cervical cancer 07/16/2022   History of kidney problems    History of radiation therapy 03/23/2019-05/04/2019   Cervical external beam   Dr Antony Blackbird   History of radiation therapy 05/09/2019-06/05/2019   vaginal brachytherapy   Dr Antony Blackbird   History of recent blood transfusion 05/16/2019   2 units given  per dr Lambert Mody at cancer center   Hypertension    Neuropathy    both thumbs   Obesity    Palpitations    PONV (postoperative nausea and vomiting)    Prediabetes    Preeclampsia 2006   Sleep apnea    no  cpap used insurance would not cover, osa severe per pt   SOB (shortness of breath)    Squamous cell carcinoma of cervix (HCC) 03/23/2019   Swallowing difficulty    Symptomatic skin lesion 07/14/2022   Vitamin D deficiency 01/10/2020   Past Surgical History:  Procedure Laterality Date   CESAREAN SECTION WITH BILATERAL TUBAL LIGATION  11/10/2004       DILATION AND CURETTAGE OF UTERUS  1997   IR IMAGING GUIDED PORT INSERTION  04/04/2019   IR REMOVAL TUN ACCESS W/ PORT W/O FL MOD SED  10/27/2021   OPERATIVE ULTRASOUND N/A 05/09/2019   Procedure: OPERATIVE ULTRASOUND;  Surgeon: Antony Blackbird, MD;  Location: [facility];  Service: Urology;  Laterality: N/A;   OPERATIVE ULTRASOUND N/A 05/15/2019   Procedure: OPERATIVE ULTRASOUND;  Surgeon: Antony Blackbird, MD;  Location: [facility];  Service: Urology;  Laterality: N/A;   OPERATIVE ULTRASOUND N/A 05/22/2019   Procedure: OPERATIVE ULTRASOUND;  Surgeon: Antony Blackbird, MD;  Location: [facility];  Service: Urology;  Laterality: N/A;   OPERATIVE ULTRASOUND N/A 05/29/2019   Procedure: OPERATIVE ULTRASOUND;  Surgeon: Antony Blackbird, MD;  Location: [facility];  Service:  Urology;  Laterality: N/A;   OPERATIVE ULTRASOUND N/A 06/05/2019   Procedure: OPERATIVE ULTRASOUND;  Surgeon: Antony Blackbird, MD;  Location: [facility];  Service: Urology;  Laterality: N/A;   TANDEM RING INSERTION N/A 05/09/2019   Procedure: TANDEM RING INSERTION;  Surgeon: Antony Blackbird, MD;  Location: [facility];  Service: Urology;  Laterality: N/A;   TANDEM RING INSERTION N/A 05/15/2019   Procedure: TANDEM RING INSERTION;  Surgeon: Antony Blackbird, MD;  Location: [facility];  Service: Urology;  Laterality: N/A;   TANDEM RING INSERTION N/A 05/22/2019   Procedure: TANDEM RING INSERTION;  Surgeon: Antony Blackbird, MD;  Location: [facility];  Service: Urology;  Laterality: N/A;    TANDEM RING INSERTION N/A 05/29/2019   Procedure: TANDEM RING INSERTION;  Surgeon: Antony Blackbird, MD;  Location: [facility];  Service: Urology;  Laterality: N/A;   TANDEM RING INSERTION N/A 06/05/2019   Procedure: TANDEM RING INSERTION;  Surgeon: Antony Blackbird, MD;  Location: [facility];  Service: Urology;  Laterality: N/A;   Patient Active Problem List   Diagnosis Date Noted   HSV-2 infection 07/13/2022   Dyspareunia in female 10/23/2021   Hyperlipidemia associated with type 2 diabetes mellitus (HCC) 07/03/2021   OSA (obstructive sleep apnea) 07/31/2020   Other hyperlipidemia 01/10/2020   Insulin resistance 01/10/2020   Class 2 severe obesity with serious comorbidity and body mass index (BMI) of 39.0 to 39.9 in adult [facility]) 01/10/2020   Peripheral 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) 04/25/2019   CKD (chronic kidney disease), symptom management only, stage 3 (moderate) (HCC) 03/30/2019   Generalized anxiety disorder 03/30/2019   Hypertension associated with type 2 diabetes mellitus (HCC)    Deficiency anemia 03/22/2019    ONSET DATE: 2 years  REFERRING DIAG: G62.9 (ICD-10-CM) - Neuropathy R26.89 (ICD-10-CM) - Balance problem  THERAPY DIAG:  Muscle weakness (generalized)  Unsteadiness on feet  Other abnormalities of gait and mobility  Difficulty in walking, not elsewhere classified  Rationale for Evaluation and Treatment: Rehabilitation  SUBJECTIVE:                                                                                                                                                                                             SUBJECTIVE STATEMENT: Doing ok. The toe is healing up.  Minimal swelling in the feet but by 4 PM need to sit and elevate. Having right thigh numbing pain, tingling, pricking and occasional shooting pain which seems to come on more with prolonged static standing  Pt accompanied by: self  PERTINENT HISTORY: 47 y.o.  female with a  hx of hypertension, preeclampsia, hyperlipidemia, OSA, diabetes, squamous cell carcinoma of cervix s/p radiation and chemotherapy, morbid obesity. For exercise she was previously in aquatic therapy, now participates in Silver sneakers. She states that every 6 months she has recurring issues with capsulitis/paronychia in her feet which has affected her physical activity. She follows with her podiatrist  PAIN:  Are you having pain?  Not much pain, just numbness  PRECAUTIONS: None  RED FLAGS: None   WEIGHT BEARING RESTRICTIONS: No  FALLS: Has patient fallen in last 6 months? No  LIVING ENVIRONMENT: Lives with: lives with their partner Lives in: House/apartment Stairs:  split foyer, 6 steps up/down, bilat HR Has following equipment at home: None  PLOF: Independent  PATIENT GOALS:   OBJECTIVE:   TODAY'S TREATMENT: 12/10/22 Activity Comments  POC review STG/LTG addressed  M-CTSIB Condition 1: normal Condition 2: mild Condition 3: mild-moderate Condition 4: moderate  Discussion of POC details   HEP review            PATIENT EDUCATION: (SELF CARE) Education details:  Person educated: Patient Education method: Programmer, multimedia, Facilities manager, and Handouts Education comprehension: verbalized understanding, returned demonstration, and needs further education  ---------------------------------------------------------------------------- Objective measures below taken at initial evaluation:  DIAGNOSTIC FINDINGS: N/A for episode  COGNITION: Overall cognitive status:  WFL, pt reports some memory deficits since chemotherapy   SENSATION: Light touch: Impaired  dorsum and plantar surfaces bilateral feet  COORDINATION: WFL  EDEMA:  Some swelling in bilat feet appreciated  MUSCLE TONE: WNL     POSTURE: No Significant postural limitations  LOWER EXTREMITY ROM:     Active  Right Eval Left Eval  Hip flexion    Hip extension    Hip abduction    Hip  adduction    Hip internal rotation    Hip external rotation    Knee flexion 115 115  Knee extension 0 0  Ankle dorsiflexion 15 15  Ankle plantarflexion 30 30  Ankle inversion    Ankle eversion     (Blank rows = not tested)  LOWER EXTREMITY MMT:    MMT Right Eval Left Eval  Hip flexion 5 5  Hip extension    Hip abduction    Hip adduction    Hip internal rotation    Hip external rotation    Knee flexion 5 5  Knee extension 5 5  Ankle dorsiflexion 3 4  Ankle plantarflexion 4 4  Ankle inversion    Ankle eversion    (Blank rows = not tested)  BED MOBILITY:  indep  TRANSFERS: Assistive device utilized: None  Sit to stand: Complete Independence Stand to sit: Complete Independence Chair to chair: Complete Independence Floor:  NT    CURB:  Level of Assistance: Modified independence Assistive device utilized: None Curb Comments: increased time to set body position  STAIRS: Level of Assistance: Modified independence Stair Negotiation Technique: Step to Pattern Alternating Pattern  with Bilateral Rails Number of Stairs: 12  Height of Stairs: 4-6  Comments:   GAIT: Gait pattern: decreased stride length and antalgic Distance walked:  Assistive device utilized: None Level of assistance: Complete Independence Comments:   FUNCTIONAL TESTS:  5 times sit to stand: 14.53 sec Timed up and go (TUG): NT Berg Balance Scale: 49/56  M-CTSIB  Condition 1: Firm Surface, EO 30 Sec, Normal Sway  Condition 2: Firm Surface, EC 30 Sec, Mild and Moderate Sway  Condition 3: Foam Surface, EO 30 Sec, Mild Sway  Condition 4: Foam Surface, EC 30  Sec, Mild and Moderate Sway       PATIENT EDUCATION: Education details: assessment details, rationale of PT intervention Person educated: Patient Education method: Explanation Education comprehension: verbalized understanding  HOME EXERCISE PROGRAM: Access Code: [email or web address] URL: [email or web address] Date:  11/26/2022 Prepared by: [facility] Outpatient  Rehab - [facility]  Exercises - Seated Ankle Dorsiflexion with Resistance  - 1 x daily - 7 x weekly - 3 sets - 10 reps - Standing Balance in Corner  - 1 x daily - 7 x weekly - 1 sets - 5 reps - Standing Balance in Corner with Eyes Closed  - 1 x daily - 7 x weekly - 1 sets - 5 reps - Sidelying Hip Abduction  - 1 x daily - 5 x weekly - 3 sets - 10 reps - Supine Bridge  - 1 x daily - 5 x weekly - 3 sets - 10 reps - 3 sec hold - Hooklying Clamshell with Resistance  - 1 x daily - 7 x weekly - 3 sets - 10 reps - Hooklying Single Knee to Chest Stretch  - 1 x daily - 7 x weekly - 1 sets - 5 reps - 15 sec hold  GOALS: Goals reviewed with patient? Yes  SHORT TERM GOALS: Target date: 01/07/2023     Patient will be independent in HEP to improve functional outcomes Baseline: Goal status: MET, 11/26/2022  2.  Demo improved balance and reduce risk for falls per score 52/56 Berg Balance Test Baseline: 49/56; (12/10/22) 55/56 Goal status: MET  3.  Demo improved ambulation tolerance as evidenced by distance of 1,500 ft during  Baseline: initial 630 ft 11/05/2022  Goal status:IN PROGRESS  4.  Demo improved postural stability for improved safety with ADL as evidenced by normal-mild sway x 30 sec condition 4 M-CTSIB.  Baseline: moderate  Goal status: INITIAL  LONG TERM GOALS: Target date: 02/04/2023      Patient to be independent with advanced HEP to include land/aquatic activities as indicated Baseline:  Goal status: IN PROGRESS  2.  Demo 4+/5 bilateral ankle dorsiflexion and plantarflexion strength for improved stability Baseline: (12/10/22) 3+/5 right dorsiflexion, 5/5 plantarflexion Goal status: PARTIALLY MET  3.  Patient to report absent right thigh numbness for improved comfort Baseline: daily, with prolonged standing Goal status: IN PROGRESS    ASSESSMENT:  CLINICAL IMPRESSION: Returns to clinic and review of POC details  for re-assessment.  Demonstrates marked improvement with Berg Balance Test score of 55/56 indicating low risk for falls. Demonstrates improved plantarflexion strength to 5/5 but right ankle dorsiflexion remains weak with rating of 3+/5 breaking under mild pressure.  M-CTSIB performed and demonstrates moderate sway condition 2 and 4 indicating reduced postural stability/proprioceptive awareness.  Pt notes right thigh sensory disturbance still present with prolonged standing.  Reports continued limited walking tolerance.  Of note, patient had several missed visits due to right toe infection which required reduced activity participation due to risk which limited full participation.  This issue has now resolved and MD reports she is able to participate without restriction.  Patient would benefit from continued sessions to progress POC details to improve mobility and activity tolerance.    OBJECTIVE IMPAIRMENTS: Abnormal gait, decreased activity tolerance, decreased balance, difficulty walking, decreased strength, and impaired sensation.   ACTIVITY LIMITATIONS: standing, stairs, and locomotion level  PARTICIPATION LIMITATIONS: cleaning and community activity  PERSONAL FACTORS: Time since onset of injury/illness/exacerbation and 1-2 comorbidities: PMH  are also affecting patient's functional outcome.  REHAB POTENTIAL: Good  CLINICAL DECISION MAKING: Evolving/moderate complexity  EVALUATION COMPLEXITY: Moderate  PLAN:  PT FREQUENCY: 1x/week  PT DURATION: 8 weeks  PLANNED INTERVENTIONS: Therapeutic exercises, Therapeutic activity, Neuromuscular re-education, Balance training, Gait training, Patient/Family education, Self Care, Joint mobilization, Stair training, Vestibular training, Canalith repositioning, DME instructions, Aquatic Therapy, Dry Needling, Electrical stimulation, Spinal mobilization, Cryotherapy, Moist heat, Taping, and Manual therapy  PLAN FOR NEXT SESSION: Continued sessions to  progress land/aquatic HEP  11:54 AM, 12/10/22 M. Shary Decamp, PT, DPT Physical Therapist- [place] Office Number: [phone]   Check all possible CPT codes: 09811- Therapeutic Exercise, [phone]- Neuro Re-education, [phone] - Gait Training, [phone] - Manual Therapy, [phone] - Therapeutic Activities, [phone] - Self Care, [phone] - Orthotic Fit, [phone] - Aquatic therapy, and [phone] - Canalith Repositioning    Check all conditions that are expected to impact treatment: {Conditions expected to impact treatment:Morbid obesity, Diabetes mellitus, and Neurological condition and/or seizures   If treatment provided at initial evaluation, no treatment charged due to lack of authorization.

## 2022-12-10 NOTE — Addendum Note (Signed)
Addended by: Dion Body on: 12/10/2022 12:04 PM   Modules accepted: Orders

## 2022-12-11 ENCOUNTER — Ambulatory Visit: Payer: Medicaid Other | Admitting: Gynecologic Oncology

## 2022-12-11 NOTE — Progress Notes (Signed)
Appt cancelled. No charge encounter.   Alver Sorrow, NP

## 2022-12-17 ENCOUNTER — Encounter: Payer: Self-pay | Admitting: Physical Therapy

## 2022-12-17 ENCOUNTER — Ambulatory Visit: Payer: Medicaid Other | Admitting: Physical Therapy

## 2022-12-17 ENCOUNTER — Ambulatory Visit: Payer: Medicaid Other

## 2022-12-17 DIAGNOSIS — R2681 Unsteadiness on feet: Secondary | ICD-10-CM

## 2022-12-17 DIAGNOSIS — M6281 Muscle weakness (generalized): Secondary | ICD-10-CM | POA: Diagnosis not present

## 2022-12-17 DIAGNOSIS — R2689 Other abnormalities of gait and mobility: Secondary | ICD-10-CM

## 2022-12-17 NOTE — Therapy (Signed)
OUTPATIENT PHYSICAL THERAPY NEURO TREATMENT   Patient Name: Tamara Osborne MRN: [id number] DOB:07/14/1975, 47 y.o., female Today's Date: 12/17/2022   PCP: Alyson Reedy, FNP REFERRING PROVIDER: Chilton Si, MD      END OF SESSION:  PT End of Session - 12/17/22 1324     Visit Number 7    Number of Visits 14    Date for PT Re-Evaluation 02/04/23    Authorization Type [place] Medicaid [employer]    PT Start Time 1100    PT Stop Time 1144    PT Time Calculation (min) 44 min    Equipment Utilized During Treatment Other (comment)   aquatic cuff, yellow noodle, 5" step   Activity Tolerance Patient tolerated treatment well    Behavior During Therapy WFL for tasks assessed/performed                Past Medical History:  Diagnosis Date   Anemia    Angio-edema    Anxiety    Bartholin cyst    Cervical cancer (HCC)    Chronic kidney disease    Constipation    Depression    Deviated septum    Diabetes mellitus without complication (HCC)    Difficult intravenous access    used port for 05-09-2019 surgery   Fibroids    History of blood transfusion    2 units given 04-26-2019, has had total of 8 units since jan 2021   History of blood transfusion 1 unit given 05-30-19   iv fluids also given   History of cervical cancer 07/16/2022   History of kidney problems    History of radiation therapy 03/23/2019-05/04/2019   Cervical external beam   Dr Antony Blackbird   History of radiation therapy 05/09/2019-06/05/2019   vaginal brachytherapy   Dr Antony Blackbird   History of recent blood transfusion 05/16/2019   2 units given  per dr Lambert Mody at cancer center   Hypertension    Neuropathy    both thumbs   Obesity    Palpitations    PONV (postoperative nausea and vomiting)    Prediabetes    Preeclampsia 2006   Sleep apnea    no cpap used insurance would not cover, osa severe per pt   SOB (shortness of breath)    Squamous cell carcinoma of cervix (HCC) 03/23/2019   Swallowing  difficulty    Symptomatic skin lesion 07/14/2022   Vitamin D deficiency 01/10/2020   Past Surgical History:  Procedure Laterality Date   CESAREAN SECTION WITH BILATERAL TUBAL LIGATION  11/10/2004       DILATION AND CURETTAGE OF UTERUS  1997   IR IMAGING GUIDED PORT INSERTION  04/04/2019   IR REMOVAL TUN ACCESS W/ PORT W/O FL MOD SED  10/27/2021   OPERATIVE ULTRASOUND N/A 05/09/2019   Procedure: OPERATIVE ULTRASOUND;  Surgeon: Antony Blackbird, MD;  Location: [facility];  Service: Urology;  Laterality: N/A;   OPERATIVE ULTRASOUND N/A 05/15/2019   Procedure: OPERATIVE ULTRASOUND;  Surgeon: Antony Blackbird, MD;  Location: [facility];  Service: Urology;  Laterality: N/A;   OPERATIVE ULTRASOUND N/A 05/22/2019   Procedure: OPERATIVE ULTRASOUND;  Surgeon: Antony Blackbird, MD;  Location: [facility];  Service: Urology;  Laterality: N/A;   OPERATIVE ULTRASOUND N/A 05/29/2019   Procedure: OPERATIVE ULTRASOUND;  Surgeon: Antony Blackbird, MD;  Location: [facility];  Service: Urology;  Laterality: N/A;   OPERATIVE ULTRASOUND N/A 06/05/2019   Procedure: OPERATIVE ULTRASOUND;  Surgeon: Antony Blackbird,  MD;  Location: [facility];  Service: Urology;  Laterality: N/A;   TANDEM RING INSERTION N/A 05/09/2019   Procedure: TANDEM RING INSERTION;  Surgeon: Antony Blackbird, MD;  Location: [facility];  Service: Urology;  Laterality: N/A;   TANDEM RING INSERTION N/A 05/15/2019   Procedure: TANDEM RING INSERTION;  Surgeon: Antony Blackbird, MD;  Location: [facility];  Service: Urology;  Laterality: N/A;   TANDEM RING INSERTION N/A 05/22/2019   Procedure: TANDEM RING INSERTION;  Surgeon: Antony Blackbird, MD;  Location: [facility];  Service: Urology;  Laterality: N/A;   TANDEM RING INSERTION N/A 05/29/2019   Procedure: TANDEM RING INSERTION;  Surgeon: Antony Blackbird, MD;  Location: [facility];  Service:  Urology;  Laterality: N/A;   TANDEM RING INSERTION N/A 06/05/2019   Procedure: TANDEM RING INSERTION;  Surgeon: Antony Blackbird, MD;  Location: [facility];  Service: Urology;  Laterality: N/A;   Patient Active Problem List   Diagnosis Date Noted   HSV-2 infection 07/13/2022   Dyspareunia in female 10/23/2021   Hyperlipidemia associated with type 2 diabetes mellitus (HCC) 07/03/2021   OSA (obstructive sleep apnea) 07/31/2020   Other hyperlipidemia 01/10/2020   Insulin resistance 01/10/2020   Class 2 severe obesity with serious comorbidity and body mass index (BMI) of 39.0 to 39.9 in adult [facility]) 01/10/2020   Peripheral 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) 04/25/2019   CKD (chronic kidney disease), symptom management only, stage 3 (moderate) (HCC) 03/30/2019   Generalized anxiety disorder 03/30/2019   Hypertension associated with type 2 diabetes mellitus (HCC)    Deficiency anemia 03/22/2019    ONSET DATE: 2 years  REFERRING DIAG: G62.9 (ICD-10-CM) - Neuropathy R26.89 (ICD-10-CM) - Balance problem  THERAPY DIAG:  Muscle weakness (generalized)  Unsteadiness on feet  Other abnormalities of gait and mobility  Rationale for Evaluation and Treatment: Rehabilitation  SUBJECTIVE:                                                                                                                                                                                             SUBJECTIVE STATEMENT: Patient is doing well today stating she is excited as tomorrow is her first day of classes.  Pt accompanied by: self  PERTINENT HISTORY: 47 y.o. female with a hx of hypertension, preeclampsia, hyperlipidemia, OSA, diabetes, squamous cell carcinoma of cervix s/p radiation and chemotherapy, morbid obesity. For exercise she was previously in aquatic therapy, now participates in Silver sneakers. She states that every 6 months she has recurring issues with capsulitis/paronychia in her feet which  has affected her physical activity.  She follows with her podiatrist  PAIN:  Are you having pain?  No pain, right hip tightness from driving long distance  PRECAUTIONS: None  RED FLAGS: None   WEIGHT BEARING RESTRICTIONS: No  FALLS: Has patient fallen in last 6 months? No  LIVING ENVIRONMENT: Lives with: lives with their partner Lives in: House/apartment Stairs:  split foyer, 6 steps up/down, bilat HR Has following equipment at home: None  PLOF: Independent  PATIENT GOALS:   OBJECTIVE:   TODAY'S TREATMENT: 12/10/22 Activity Comments  POC review STG/LTG addressed  M-CTSIB Condition 1: normal Condition 2: mild Condition 3: mild-moderate Condition 4: moderate  Discussion of POC details   HEP review            PATIENT EDUCATION: (SELF CARE) Education details:  Person educated: Patient Education method: Programmer, multimedia, Facilities manager, and Handouts Education comprehension: verbalized understanding, returned demonstration, and needs further education  ---------------------------------------------------------------------------- Objective measures below taken at initial evaluation:  DIAGNOSTIC FINDINGS: N/A for episode  COGNITION: Overall cognitive status:  WFL, pt reports some memory deficits since chemotherapy   SENSATION: Light touch: Impaired  dorsum and plantar surfaces bilateral feet  COORDINATION: WFL  EDEMA:  Some swelling in bilat feet appreciated  MUSCLE TONE: WNL     POSTURE: No Significant postural limitations  LOWER EXTREMITY ROM:     Active  Right Eval Left Eval  Hip flexion    Hip extension    Hip abduction    Hip adduction    Hip internal rotation    Hip external rotation    Knee flexion 115 115  Knee extension 0 0  Ankle dorsiflexion 15 15  Ankle plantarflexion 30 30  Ankle inversion    Ankle eversion     (Blank rows = not tested)  LOWER EXTREMITY MMT:    MMT Right Eval Left Eval  Hip flexion 5 5  Hip extension    Hip  abduction    Hip adduction    Hip internal rotation    Hip external rotation    Knee flexion 5 5  Knee extension 5 5  Ankle dorsiflexion 3 4  Ankle plantarflexion 4 4  Ankle inversion    Ankle eversion    (Blank rows = not tested)  BED MOBILITY:  indep  TRANSFERS: Assistive device utilized: None  Sit to stand: Complete Independence Stand to sit: Complete Independence Chair to chair: Complete Independence Floor:  NT    CURB:  Level of Assistance: Modified independence Assistive device utilized: None Curb Comments: increased time to set body position  STAIRS: Level of Assistance: Modified independence Stair Negotiation Technique: Step to Pattern Alternating Pattern  with Bilateral Rails Number of Stairs: 12  Height of Stairs: 4-6  Comments:   GAIT: Gait pattern: decreased stride length and antalgic Distance walked:  Assistive device utilized: None Level of assistance: Complete Independence Comments:   FUNCTIONAL TESTS:  5 times sit to stand: 14.53 sec Timed up and go (TUG): NT Berg Balance Scale: 49/56  M-CTSIB  Condition 1: Firm Surface, EO 30 Sec, Normal Sway  Condition 2: Firm Surface, EC 30 Sec, Mild and Moderate Sway  Condition 3: Foam Surface, EO 30 Sec, Mild Sway  Condition 4: Foam Surface, EC 30 Sec, Mild and Moderate Sway   TODAY'S TREATMENT 12/17/2022:  Aquatic therapy at [facility] - pool temperature 92 degrees   Patient seen for aquatic therapy today.  Treatment took place in water 3.6-4.8 feet deep depending upon activity.  Patient entered and exited the pool via  stairs using step to pattern at mod I level.   Exercises: Warmup:  forward > backwards > lateral walking 6 x 18 ft unsupported -Staggered STS using 5" step to elevated alternating LE x15 each side -3-way hip using aquatic cuff x10 each direction each LE, cued for upright neutral -Wall stretches:  hamstring > piriformis > gastroc (runner's stretch) 2x1 minute each LE  -Swing sitting  on noodle performing forward reach > lateral reach > upward reach to elicit righting reactions -Seated march on noodle > opposite arm lift and leg lift to challenge core and coordination  Patient requires buoyancy of the water for support for reduced fall risk with gait training and balance exercises with SBA support. Exercises able to be performed safely in water without the risk of fall compared to those same exercises performed on land; viscosity of water needed for resistance for strengthening. Current of water provides perturbations for challenging static and dynamic balance.     PATIENT EDUCATION: Education details: Goal of developing aquatic HEP and discussed her access to the pool at the [facility]. Person educated: Patient Education method: Explanation Education comprehension: verbalized understanding  HOME EXERCISE PROGRAM: Access Code: [email or web address] URL: [email or web address] Date: 11/26/2022 Prepared by: [facility] Out[facility]  Rehab - [facility]  Exercises - Seated Ankle Dorsiflexion with Resistance  - 1 x daily - 7 x weekly - 3 sets - 10 reps - Standing Balance in Corner  - 1 x daily - 7 x weekly - 1 sets - 5 reps - Standing Balance in Corner with Eyes Closed  - 1 x daily - 7 x weekly - 1 sets - 5 reps - Sidelying Hip Abduction  - 1 x daily - 5 x weekly - 3 sets - 10 reps - Supine Bridge  - 1 x daily - 5 x weekly - 3 sets - 10 reps - 3 sec hold - Hooklying Clamshell with Resistance  - 1 x daily - 7 x weekly - 3 sets - 10 reps - Hooklying Single Knee to Chest Stretch  - 1 x daily - 7 x weekly - 1 sets - 5 reps - 15 sec hold  GOALS: Goals reviewed with patient? Yes  SHORT TERM GOALS: Target date: 01/07/2023     Patient will be independent in HEP to improve functional outcomes Baseline: Goal status: MET, 11/26/2022  2.  Demo improved balance and reduce risk for falls per score 52/56 Berg Balance Test Baseline: 49/56; (12/10/22) 55/56 Goal status: MET  3.   Demo improved ambulation tolerance as evidenced by distance of 1,500 ft during  Baseline: initial 630 ft 11/05/2022  Goal status:IN PROGRESS  4.  Demo improved postural stability for improved safety with ADL as evidenced by normal-mild sway x 30 sec condition 4 M-CTSIB.  Baseline: moderate  Goal status: INITIAL  LONG TERM GOALS: Target date: 02/04/2023      Patient to be independent with advanced HEP to include land/aquatic activities as indicated Baseline:  Goal status: IN PROGRESS  2.  Demo 4+/5 bilateral ankle dorsiflexion and plantarflexion strength for improved stability Baseline: (12/10/22) 3+/5 right dorsiflexion, 5/5 plantarflexion Goal status: PARTIALLY MET  3.  Patient to report absent right thigh numbness for improved comfort Baseline: daily, with prolonged standing Goal status: IN PROGRESS    ASSESSMENT:  CLINICAL IMPRESSION: Focus of skilled session on addressing stiffness of right hip and global strengthening.  She tolerates all interventions well and continues to benefit from aquatic setting for ongoing pain management and  fluidity of movement.  Will continue per POC.   OBJECTIVE IMPAIRMENTS: Abnormal gait, decreased activity tolerance, decreased balance, difficulty walking, decreased strength, and impaired sensation.   ACTIVITY LIMITATIONS: standing, stairs, and locomotion level  PARTICIPATION LIMITATIONS: cleaning and community activity  PERSONAL FACTORS: Time since onset of injury/illness/exacerbation and 1-2 comorbidities: PMH  are also affecting patient's functional outcome.   REHAB POTENTIAL: Good  CLINICAL DECISION MAKING: Evolving/moderate complexity  EVALUATION COMPLEXITY: Moderate  PLAN:  PT FREQUENCY: 1x/week  PT DURATION: 8 weeks  PLANNED INTERVENTIONS: Therapeutic exercises, Therapeutic activity, Neuromuscular re-education, Balance training, Gait training, Patient/Family education, Self Care, Joint mobilization, Stair training,  Vestibular training, Canalith repositioning, DME instructions, Aquatic Therapy, Dry Needling, Electrical stimulation, Spinal mobilization, Cryotherapy, Moist heat, Taping, and Manual therapy  PLAN FOR NEXT SESSION: Continued sessions to progress land  Aquatic HEP - ai chi?  1:26 PM, 12/17/22 Camille Bal, PT, DPT  Check all possible CPT codes: 16109- Therapeutic Exercise, [phone]- Neuro Re-education, [phone] - Gait Training, [phone] - Manual Therapy, [phone] - Therapeutic Activities, [phone] - Self Care, [phone] - Orthotic Fit, [phone] - Aquatic therapy, and [phone] - Canalith Repositioning    Check all conditions that are expected to impact treatment: {Conditions expected to impact treatment:Morbid obesity, Diabetes mellitus, and Neurological condition and/or seizures   If treatment provided at initial evaluation, no treatment charged due to lack of authorization.

## 2022-12-19 NOTE — Progress Notes (Signed)
Radiation Oncology         (336) [phone] ________________________________  Name: Tamara Osborne MRN: [id number]  Date: 12/21/2022  DOB: November 27, 1975  Follow-Up Visit Note  CC: Alyson Reedy, FNP  Pllc, [facility]*  No diagnosis found.  Diagnosis:  Stage II-B squamous cell carcinoma of the cervix   Interval Since Last Radiation: 2 years, 6 months, and 16 days   External beam therapy was from 03/23/2019 - 05/04/2019. HDR vaginal brachytherapy was from 05/19/2019 - 06/05/2019.   Site Technique Total Dose (Gy) Dose per Fx (Gy) Completed Fx Beam Energies  Cervix: Cervix HDR-brachy 5.5/5.5 5.5 1/1 Ir-192  Cervix: Cervix HDR-brachy 5.5/5.5 5.5 1/1 Ir-192  Cervix: Cervix_Bst HDR-brachy 5.5/5.5 5.5 1/1 Ir-192  Cervix: Cervix HDR-brachy 5.5/5.5 5.5 1/1 Ir-192  Cervix: Cervix 3D 45/45 1.8 25/25 10X, 15X  Cervix: Cervix HDR-brachy 5.5/5.5 5.5 1/1 Ir-192  Cervix: Cervix_Bst 3D 9/9 1.8 5/5 15X   Narrative:  The patient returns today for routine annual follow-up. She was last seen here for follow-up on 12/15/21.  Since her last visit, the patient followed up with [name] on 06/11/22. During which time, she reported having minimal spotting with vaginal dilator use. She otherwise denied any symptoms concerning for disease recurrence and she was noted as NED on examination. Pap and HPV testing were performed during this visit which both came back negative.      She did return to Dr. Winferd Humphrey office on 05/10 for evaluation of a lesion on the upper left buttock. She apparently has had intermittent recurrences of this since at least 2015. Cultures from the lesion were obtained which showed normal findings. 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  No other significant interval history since the patient was last seen for follow-up.   ***  Allergies:  is allergic to penicillins, shellfish allergy, and sulfa  antibiotics.  Meds: Current Outpatient Medications  Medication Sig Dispense Refill   Accu-Chek Softclix Lancets lancets Use to check blood sugar TID. 100 each 2   amLODipine-valsartan (EXFORGE) 10-320 MG tablet Take 1 tablet by mouth daily. 90 tablet 0   azelastine (ASTELIN) 0.1 % nasal spray Place 1 spray into both nostrils 2 (two) times daily as needed. Use in each nostril as directed 30 mL 5   Blood Glucose Monitoring Suppl (ACCU-CHEK GUIDE) w/Device KIT Use to check blood sugar TID. 1 kit 0   chlorthalidone (HYGROTON) 25 MG tablet Take 1 tablet (25 mg total) by mouth daily. 90 tablet 3   Cholecalciferol 50 MCG (2000 UT) TABS Take by mouth.     doxycycline (VIBRA-TABS) 100 MG tablet Take 1 tablet (100 mg total) by mouth 2 (two) times daily. 20 tablet 1   EPINEPHrine (EPIPEN 2-PAK) 0.3 mg/0.3 mL IJ SOAJ injection Inject 0.3 mg into the muscle as needed for anaphylaxis. 2 each 1   fluticasone (FLONASE) 50 MCG/ACT nasal spray Place 2 sprays into both nostrils daily. 16 g 5   glucose blood (ACCU-CHEK GUIDE) test strip Use to check blood sugar TID. 100 each 2   ipratropium (ATROVENT) 0.06 % nasal spray Place 2 sprays into both nostrils 3 (three) times daily as needed for rhinitis (drainage). 15 mL 5   levocetirizine (XYZAL) 5 MG tablet Take 1 tablet (5 mg total) by mouth every evening. 90 tablet 1   RESTASIS 0.05 % ophthalmic emulsion 1 drop 2 (two) times daily.     Semaglutide, 1 MG/DOSE, 4 MG/3ML  SOPN Inject 1 mg as directed once a week. 3 mL 0   sertraline (ZOLOFT) 100 MG tablet Take 100 mg by mouth daily.     No current facility-administered medications for this encounter.    Physical Findings: The patient is in no acute distress. Patient is alert and oriented.  vitals were not taken for this visit. .  No significant changes. Lungs are clear to auscultation bilaterally. Heart has regular rate and rhythm. No palpable cervical, supraclavicular, or axillary adenopathy. Abdomen soft,  non-tender, normal bowel sounds.  On pelvic examination the external genitalia were unremarkable. A speculum exam was performed. There are no mucosal lesions noted in the vaginal vault. A Pap smear was obtained of the proximal vagina. On bimanual and rectovaginal examination there were no pelvic masses appreciated. ***   Lab Findings: Lab Results  Component Value Date   WBC 9.3 08/06/2022   HGB 15.3 08/06/2022   HCT 45.3 08/06/2022   MCV 83 08/06/2022   PLT 326 08/06/2022    Radiographic Findings: VAS US RENAL ARTERY DUPLEX  Result Date: 11/24/2022 ABDOMINAL VISCERAL Patient Name:  Tamara Osborne  Date of Exam:   11/24/2022 Medical Rec #: [id number]           Accession #:    [id number] Date of Birth: Mar 08, 1975            Patient Gender: F Patient Age:   47 years Exam Location:  [facility] Procedure:      VAS US RENAL ARTERY DUPLEX Referring Phys: [id number] TIFFANY [place] -------------------------------------------------------------------------------- Indications: Hypertension High Risk Factors: Hypertension, hyperlipidemia, Diabetes, no history of                    smoking. Limitations: Air/bowel gas, obesity and patient reports eating 2 chicken patties before testing. Comparison Study: NA Performing Technologist: Jeryl Columbia RDCS  Examination Guidelines: A complete evaluation includes B-mode imaging, spectral Doppler, color Doppler, and power Doppler as needed of all accessible portions of each vessel. Bilateral testing is considered an integral part of a complete examination. Limited examinations for reoccurring indications may be performed as noted.  Duplex Findings: +------------------+--------+--------+-------+ Right Renal ArteryPSV cm/sEDV cm/sComment +------------------+--------+--------+-------+ Origin               84      23           +------------------+--------+--------+-------+ Proximal             85      21           +------------------+--------+--------+-------+  Mid                 134      33           +------------------+--------+--------+-------+ Distal               74      24           +------------------+--------+--------+-------+ +-----------------+--------+--------+-------+ Left Renal ArteryPSV cm/sEDV cm/sComment +-----------------+--------+--------+-------+ Origin              87      13           +-----------------+--------+--------+-------+ Proximal           116      26           +-----------------+--------+--------+-------+ Mid                147      34           +-----------------+--------+--------+-------+  Radiation Oncology         (336) [phone] ________________________________  Name: Tamara Osborne MRN: [id number]  Date: 12/21/2022  DOB: November 27, 1975  Follow-Up Visit Note  CC: Alyson Reedy, FNP  Pllc, [facility]*  No diagnosis found.  Diagnosis:  Stage II-B squamous cell carcinoma of the cervix   Interval Since Last Radiation: 2 years, 6 months, and 16 days   External beam therapy was from 03/23/2019 - 05/04/2019. HDR vaginal brachytherapy was from 05/19/2019 - 06/05/2019.   Site Technique Total Dose (Gy) Dose per Fx (Gy) Completed Fx Beam Energies  Cervix: Cervix HDR-brachy 5.5/5.5 5.5 1/1 Ir-192  Cervix: Cervix HDR-brachy 5.5/5.5 5.5 1/1 Ir-192  Cervix: Cervix_Bst HDR-brachy 5.5/5.5 5.5 1/1 Ir-192  Cervix: Cervix HDR-brachy 5.5/5.5 5.5 1/1 Ir-192  Cervix: Cervix 3D 45/45 1.8 25/25 10X, 15X  Cervix: Cervix HDR-brachy 5.5/5.5 5.5 1/1 Ir-192  Cervix: Cervix_Bst 3D 9/9 1.8 5/5 15X   Narrative:  The patient returns today for routine annual follow-up. She was last seen here for follow-up on 12/15/21.  Since her last visit, the patient followed up with [name] on 06/11/22. During which time, she reported having minimal spotting with vaginal dilator use. She otherwise denied any symptoms concerning for disease recurrence and she was noted as NED on examination. Pap and HPV testing were performed during this visit which both came back negative.      She did return to Dr. Winferd Humphrey office on 05/10 for evaluation of a lesion on the upper left buttock. She apparently has had intermittent recurrences of this since at least 2015. Cultures from the lesion were obtained which showed normal findings. 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  No other significant interval history since the patient was last seen for follow-up.   ***  Allergies:  is allergic to penicillins, shellfish allergy, and sulfa  antibiotics.  Meds: Current Outpatient Medications  Medication Sig Dispense Refill   Accu-Chek Softclix Lancets lancets Use to check blood sugar TID. 100 each 2   amLODipine-valsartan (EXFORGE) 10-320 MG tablet Take 1 tablet by mouth daily. 90 tablet 0   azelastine (ASTELIN) 0.1 % nasal spray Place 1 spray into both nostrils 2 (two) times daily as needed. Use in each nostril as directed 30 mL 5   Blood Glucose Monitoring Suppl (ACCU-CHEK GUIDE) w/Device KIT Use to check blood sugar TID. 1 kit 0   chlorthalidone (HYGROTON) 25 MG tablet Take 1 tablet (25 mg total) by mouth daily. 90 tablet 3   Cholecalciferol 50 MCG (2000 UT) TABS Take by mouth.     doxycycline (VIBRA-TABS) 100 MG tablet Take 1 tablet (100 mg total) by mouth 2 (two) times daily. 20 tablet 1   EPINEPHrine (EPIPEN 2-PAK) 0.3 mg/0.3 mL IJ SOAJ injection Inject 0.3 mg into the muscle as needed for anaphylaxis. 2 each 1   fluticasone (FLONASE) 50 MCG/ACT nasal spray Place 2 sprays into both nostrils daily. 16 g 5   glucose blood (ACCU-CHEK GUIDE) test strip Use to check blood sugar TID. 100 each 2   ipratropium (ATROVENT) 0.06 % nasal spray Place 2 sprays into both nostrils 3 (three) times daily as needed for rhinitis (drainage). 15 mL 5   levocetirizine (XYZAL) 5 MG tablet Take 1 tablet (5 mg total) by mouth every evening. 90 tablet 1   RESTASIS 0.05 % ophthalmic emulsion 1 drop 2 (two) times daily.     Semaglutide, 1 MG/DOSE, 4 MG/3ML  SOPN Inject 1 mg as directed once a week. 3 mL 0   sertraline (ZOLOFT) 100 MG tablet Take 100 mg by mouth daily.     No current facility-administered medications for this encounter.    Physical Findings: The patient is in no acute distress. Patient is alert and oriented.  vitals were not taken for this visit. .  No significant changes. Lungs are clear to auscultation bilaterally. Heart has regular rate and rhythm. No palpable cervical, supraclavicular, or axillary adenopathy. Abdomen soft,  non-tender, normal bowel sounds.  On pelvic examination the external genitalia were unremarkable. A speculum exam was performed. There are no mucosal lesions noted in the vaginal vault. A Pap smear was obtained of the proximal vagina. On bimanual and rectovaginal examination there were no pelvic masses appreciated. ***   Lab Findings: Lab Results  Component Value Date   WBC 9.3 08/06/2022   HGB 15.3 08/06/2022   HCT 45.3 08/06/2022   MCV 83 08/06/2022   PLT 326 08/06/2022    Radiographic Findings: VAS US RENAL ARTERY DUPLEX  Result Date: 11/24/2022 ABDOMINAL VISCERAL Patient Name:  Tamara Osborne  Date of Exam:   11/24/2022 Medical Rec #: [id number]           Accession #:    [id number] Date of Birth: Mar 08, 1975            Patient Gender: F Patient Age:   47 years Exam Location:  [facility] Procedure:      VAS US RENAL ARTERY DUPLEX Referring Phys: [id number] TIFFANY [place] -------------------------------------------------------------------------------- Indications: Hypertension High Risk Factors: Hypertension, hyperlipidemia, Diabetes, no history of                    smoking. Limitations: Air/bowel gas, obesity and patient reports eating 2 chicken patties before testing. Comparison Study: NA Performing Technologist: Jeryl Columbia RDCS  Examination Guidelines: A complete evaluation includes B-mode imaging, spectral Doppler, color Doppler, and power Doppler as needed of all accessible portions of each vessel. Bilateral testing is considered an integral part of a complete examination. Limited examinations for reoccurring indications may be performed as noted.  Duplex Findings: +------------------+--------+--------+-------+ Right Renal ArteryPSV cm/sEDV cm/sComment +------------------+--------+--------+-------+ Origin               84      23           +------------------+--------+--------+-------+ Proximal             85      21           +------------------+--------+--------+-------+  Mid                 134      33           +------------------+--------+--------+-------+ Distal               74      24           +------------------+--------+--------+-------+ +-----------------+--------+--------+-------+ Left Renal ArteryPSV cm/sEDV cm/sComment +-----------------+--------+--------+-------+ Origin              87      13           +-----------------+--------+--------+-------+ Proximal           116      26           +-----------------+--------+--------+-------+ Mid                147      34           +-----------------+--------+--------+-------+

## 2022-12-21 ENCOUNTER — Encounter (INDEPENDENT_AMBULATORY_CARE_PROVIDER_SITE_OTHER): Payer: Self-pay | Admitting: Adult Health

## 2022-12-21 ENCOUNTER — Ambulatory Visit
Admission: RE | Admit: 2022-12-21 | Discharge: 2022-12-21 | Disposition: A | Discharge status: Home / Self Care | Payer: Medicaid Other | Source: Ambulatory Visit | Attending: Radiation Oncology | Admitting: Radiation Oncology

## 2022-12-21 ENCOUNTER — Encounter: Payer: Self-pay | Admitting: Radiation Oncology

## 2022-12-21 ENCOUNTER — Telehealth: Payer: Self-pay | Admitting: *Deleted

## 2022-12-21 VITALS — BP 124/75 | HR 103 | Temp 97.7°F | Resp 20 | Ht 61.0 in | Wt 230.0 lb

## 2022-12-21 DIAGNOSIS — N941 Unspecified dyspareunia: Secondary | ICD-10-CM | POA: Diagnosis not present

## 2022-12-21 DIAGNOSIS — Z8541 Personal history of malignant neoplasm of cervix uteri: Secondary | ICD-10-CM | POA: Insufficient documentation

## 2022-12-21 DIAGNOSIS — C539 Malignant neoplasm of cervix uteri, unspecified: Secondary | ICD-10-CM

## 2022-12-21 DIAGNOSIS — Z79899 Other long term (current) drug therapy: Secondary | ICD-10-CM | POA: Diagnosis not present

## 2022-12-21 DIAGNOSIS — Z923 Personal history of irradiation: Secondary | ICD-10-CM | POA: Insufficient documentation

## 2022-12-21 NOTE — Telephone Encounter (Signed)
Called [facility] and spoke with MaDonna and they will get this patient called and give her an appt.

## 2022-12-21 NOTE — Progress Notes (Signed)
Tamara Osborne is here today for follow up post radiation to the pelvic.  They completed their radiation on: 05/04/2019  Does the patient complain of any of the following:  Pain: She reports tingling and numbness in feet Abdominal bloating: No Diarrhea/Constipation: No Nausea/Vomiting: No Vaginal Discharge: No Blood in Urine or Stool: No Urinary Issues (dysuria/incomplete emptying/ incontinence/ increased frequency/urgency): Urgency Does patient report using vaginal dilator 2-3 times a week and/or sexually active 2-3 weeks: Yes Post radiation skin changes:     BP 124/75 (BP Location: Right Arm, Patient Position: Sitting, Cuff Size: Large)   Pulse (!) 103   Temp 97.7 F (36.5 C)   Resp 20   Ht 5\' 1"  (1.549 m)   Wt 230 lb (104.3 kg)   SpO2 100%   BMI 43.46 kg/m

## 2022-12-23 ENCOUNTER — Ambulatory Visit: Payer: Medicaid Other

## 2022-12-23 ENCOUNTER — Ambulatory Visit: Payer: Medicaid Other | Admitting: Physical Therapy

## 2022-12-23 DIAGNOSIS — M6281 Muscle weakness (generalized): Secondary | ICD-10-CM | POA: Diagnosis not present

## 2022-12-23 DIAGNOSIS — R2681 Unsteadiness on feet: Secondary | ICD-10-CM

## 2022-12-23 DIAGNOSIS — R262 Difficulty in walking, not elsewhere classified: Secondary | ICD-10-CM

## 2022-12-23 DIAGNOSIS — R2689 Other abnormalities of gait and mobility: Secondary | ICD-10-CM

## 2022-12-23 NOTE — Therapy (Signed)
OUTPATIENT PHYSICAL THERAPY NEURO TREATMENT   Patient Name: Tamara Osborne MRN: [id number] DOB:1975-05-24, 47 y.o., female Today's Date: 12/23/2022   PCP: Alyson Reedy, FNP REFERRING PROVIDER: Chilton Si, MD      END OF SESSION:  PT End of Session - 12/23/22 1147     Visit Number 8    Number of Visits 14    Date for PT Re-Evaluation 02/04/23    Authorization Type [place] Medicaid [employer]    PT Start Time 1145    PT Stop Time 1230    PT Time Calculation (min) 45 min    Equipment Utilized During Treatment Other (comment)   aquatic cuff, yellow noodle, 5" step   Activity Tolerance Patient tolerated treatment well    Behavior During Therapy WFL for tasks assessed/performed                 Past Medical History:  Diagnosis Date   Anemia    Angio-edema    Anxiety    Bartholin cyst    Cervical cancer (HCC)    Chronic kidney disease    Constipation    Depression    Deviated septum    Diabetes mellitus without complication (HCC)    Difficult intravenous access    used port for 05-09-2019 surgery   Fibroids    History of blood transfusion    2 units given 04-26-2019, has had total of 8 units since jan 2021   History of blood transfusion 1 unit given 05-30-19   iv fluids also given   History of cervical cancer 07/16/2022   History of kidney problems    History of radiation therapy 03/23/2019-05/04/2019   Cervical external beam   Dr Antony Blackbird   History of radiation therapy 05/09/2019-06/05/2019   vaginal brachytherapy   Dr Antony Blackbird   History of recent blood transfusion 05/16/2019   2 units given  per dr Lambert Mody at cancer center   Hypertension    Neuropathy    both thumbs   Obesity    Palpitations    PONV (postoperative nausea and vomiting)    Prediabetes    Preeclampsia 2006   Sleep apnea    no cpap used insurance would not cover, osa severe per pt   SOB (shortness of breath)    Squamous cell carcinoma of cervix (HCC) 03/23/2019   Swallowing  difficulty    Symptomatic skin lesion 07/14/2022   Vitamin D deficiency 01/10/2020   Past Surgical History:  Procedure Laterality Date   CESAREAN SECTION WITH BILATERAL TUBAL LIGATION  11/10/2004       DILATION AND CURETTAGE OF UTERUS  1997   IR IMAGING GUIDED PORT INSERTION  04/04/2019   IR REMOVAL TUN ACCESS W/ PORT W/O FL MOD SED  10/27/2021   OPERATIVE ULTRASOUND N/A 05/09/2019   Procedure: OPERATIVE ULTRASOUND;  Surgeon: Antony Blackbird, MD;  Location: [facility] [place];  Service: Urology;  Laterality: N/A;   OPERATIVE ULTRASOUND N/A 05/15/2019   Procedure: OPERATIVE ULTRASOUND;  Surgeon: Antony Blackbird, MD;  Location: [facility];  Service: Urology;  Laterality: N/A;   OPERATIVE ULTRASOUND N/A 05/22/2019   Procedure: OPERATIVE ULTRASOUND;  Surgeon: Antony Blackbird, MD;  Location: [facility];  Service: Urology;  Laterality: N/A;   OPERATIVE ULTRASOUND N/A 05/29/2019   Procedure: OPERATIVE ULTRASOUND;  Surgeon: Antony Blackbird, MD;  Location: [facility];  Service: Urology;  Laterality: N/A;   OPERATIVE ULTRASOUND N/A 06/05/2019   Procedure: OPERATIVE ULTRASOUND;  Surgeon: Roselind Messier,  Fayrene Fearing, MD;  Location: [facility];  Service: Urology;  Laterality: N/A;   TANDEM RING INSERTION N/A 05/09/2019   Procedure: TANDEM RING INSERTION;  Surgeon: Antony Blackbird, MD;  Location: [facility];  Service: Urology;  Laterality: N/A;   TANDEM RING INSERTION N/A 05/15/2019   Procedure: TANDEM RING INSERTION;  Surgeon: Antony Blackbird, MD;  Location: [facility];  Service: Urology;  Laterality: N/A;   TANDEM RING INSERTION N/A 05/22/2019   Procedure: TANDEM RING INSERTION;  Surgeon: Antony Blackbird, MD;  Location: [facility];  Service: Urology;  Laterality: N/A;   TANDEM RING INSERTION N/A 05/29/2019   Procedure: TANDEM RING INSERTION;  Surgeon: Antony Blackbird, MD;  Location: [facility];  Service:  Urology;  Laterality: N/A;   TANDEM RING INSERTION N/A 06/05/2019   Procedure: TANDEM RING INSERTION;  Surgeon: Antony Blackbird, MD;  Location: [facility];  Service: Urology;  Laterality: N/A;   Patient Active Problem List   Diagnosis Date Noted   HSV-2 infection 07/13/2022   Dyspareunia in female 10/23/2021   Hyperlipidemia associated with type 2 diabetes mellitus (HCC) 07/03/2021   OSA (obstructive sleep apnea) 07/31/2020   Other hyperlipidemia 01/10/2020   Insulin resistance 01/10/2020   Class 2 severe obesity with serious comorbidity and body mass index (BMI) of 39.0 to 39.9 in adult [facility]) 01/10/2020   Peripheral 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) 04/25/2019   CKD (chronic kidney disease), symptom management only, stage 3 (moderate) (HCC) 03/30/2019   Generalized anxiety disorder 03/30/2019   Hypertension associated with type 2 diabetes mellitus (HCC)    Squamous cell carcinoma of cervix (HCC) 03/23/2019   Deficiency anemia 03/22/2019    ONSET DATE: 2 years  REFERRING DIAG: G62.9 (ICD-10-CM) - Neuropathy R26.89 (ICD-10-CM) - Balance problem  THERAPY DIAG:  Muscle weakness (generalized)  Unsteadiness on feet  Difficulty in walking, not elsewhere classified  Other abnormalities of gait and mobility  Rationale for Evaluation and Treatment: Rehabilitation  SUBJECTIVE:                                                                                                                                                                                             SUBJECTIVE STATEMENT: Toe is sore again causing difficulty walking. Had a good session at pool PT last week. Continuing to have right thigh parasthesias/pain with prolonged standing/walking which resolves with seated rest  Pt accompanied by: self  PERTINENT HISTORY: 47 y.o. female with a hx of hypertension, preeclampsia, hyperlipidemia, OSA, diabetes, squamous cell carcinoma of cervix s/p radiation and  chemotherapy, morbid obesity. For exercise she was  previously in aquatic therapy, now participates in Silver sneakers. She states that every 6 months she has recurring issues with capsulitis/paronychia in her feet which has affected her physical activity. She follows with her podiatrist  PAIN:  Are you having pain?  No pain, right hip tightness from driving long distance  PRECAUTIONS: None  RED FLAGS: None   WEIGHT BEARING RESTRICTIONS: No  FALLS: Has patient fallen in last 6 months? No  LIVING ENVIRONMENT: Lives with: lives with their partner Lives in: House/apartment Stairs:  split foyer, 6 steps up/down, bilat HR Has following equipment at home: None  PLOF: Independent  PATIENT GOALS:   OBJECTIVE:   TODAY'S TREATMENT: 12/23/22 Activity Comments  Discussion of current symptoms   HEP -progressed clamshells to blue resistance -bridges with blue loop  Balance activities  Feet together EO/EC -tandem stance  Gastroc stretch X 60 sec            PATIENT EDUCATION: Education details: Goal of developing aquatic HEP and discussed her access to the pool at [employer]. Person educated: Patient Education method: Explanation Education comprehension: verbalized understanding  HOME EXERCISE PROGRAM: Access Code: [email or web address] URL: [email or web address] Date: 11/26/2022 Prepared by: [facility] - Outpatient  Rehab - [facility]  Exercises - Seated Ankle Dorsiflexion with Resistance  - 1 x daily - 7 x weekly - 3 sets - 10 reps - Standing Balance in Corner  - 1 x daily - 7 x weekly - 1 sets - 5 reps - Standing Balance in Corner with Eyes Closed  - 1 x daily - 7 x weekly - 1 sets - 5 reps - Sidelying Hip Abduction  - 1 x daily - 5 x weekly - 3 sets - 10 reps - Supine Bridge with Resistance Band  - 1 x daily - 7 x weekly - 3 sets - 10 reps - Hooklying Clamshell with Resistance  - 1 x daily - 7 x weekly - 3 sets - 10 reps - Hooklying Single Knee to Chest Stretch  - 1 x  daily - 7 x weekly - 1 sets - 5 reps - 15 sec hold - Tandem Stance in Corner  - 1 x daily - 7 x weekly - 3 sets - 30 sec hold - Standing Gastroc Stretch  - 1 x daily - 7 x weekly - 3 sets - 30-60 sec hold  ---------------------------------------------------------------------------- Objective measures below taken at initial evaluation:  DIAGNOSTIC FINDINGS: N/A for episode  COGNITION: Overall cognitive status:  WFL, pt reports some memory deficits since chemotherapy   SENSATION: Light touch: Impaired  dorsum and plantar surfaces bilateral feet  COORDINATION: WFL  EDEMA:  Some swelling in bilat feet appreciated  MUSCLE TONE: WNL     POSTURE: No Significant postural limitations  LOWER EXTREMITY ROM:     Active  Right Eval Left Eval  Hip flexion    Hip extension    Hip abduction    Hip adduction    Hip internal rotation    Hip external rotation    Knee flexion 115 115  Knee extension 0 0  Ankle dorsiflexion 15 15  Ankle plantarflexion 30 30  Ankle inversion    Ankle eversion     (Blank rows = not tested)  LOWER EXTREMITY MMT:    MMT Right Eval Left Eval  Hip flexion 5 5  Hip extension    Hip abduction    Hip adduction    Hip internal rotation    Hip external rotation  Knee flexion 5 5  Knee extension 5 5  Ankle dorsiflexion 3 4  Ankle plantarflexion 4 4  Ankle inversion    Ankle eversion    (Blank rows = not tested)  BED MOBILITY:  indep  TRANSFERS: Assistive device utilized: None  Sit to stand: Complete Independence Stand to sit: Complete Independence Chair to chair: Complete Independence Floor:  NT    CURB:  Level of Assistance: Modified independence Assistive device utilized: None Curb Comments: increased time to set body position  STAIRS: Level of Assistance: Modified independence Stair Negotiation Technique: Step to Pattern Alternating Pattern  with Bilateral Rails Number of Stairs: 12  Height of Stairs: 4-6  Comments:    GAIT: Gait pattern: decreased stride length and antalgic Distance walked:  Assistive device utilized: None Level of assistance: Complete Independence Comments:   FUNCTIONAL TESTS:  5 times sit to stand: 14.53 sec Timed up and go (TUG): NT Berg Balance Scale: 49/56  M-CTSIB  Condition 1: Firm Surface, EO 30 Sec, Normal Sway  Condition 2: Firm Surface, EC 30 Sec, Mild and Moderate Sway  Condition 3: Foam Surface, EO 30 Sec, Mild Sway  Condition 4: Foam Surface, EC 30 Sec, Mild and Moderate Sway         GOALS: Goals reviewed with patient? Yes  SHORT TERM GOALS: Target date: 01/07/2023     Patient will be independent in HEP to improve functional outcomes Baseline: Goal status: MET, 11/26/2022  2.  Demo improved balance and reduce risk for falls per score 52/56 Berg Balance Test Baseline: 49/56; (12/10/22) 55/56 Goal status: MET  3.  Demo improved ambulation tolerance as evidenced by distance of 1,500 ft during  Baseline: initial 630 ft 11/05/2022  Goal status:IN PROGRESS  4.  Demo improved postural stability for improved safety with ADL as evidenced by normal-mild sway x 30 sec condition 4 M-CTSIB.  Baseline: moderate  Goal status: IN PROGRESS  LONG TERM GOALS: Target date: 02/04/2023      Patient to be independent with advanced HEP to include land/aquatic activities as indicated Baseline:  Goal status: IN PROGRESS  2.  Demo 4+/5 bilateral ankle dorsiflexion and plantarflexion strength for improved stability Baseline: (12/10/22) 3+/5 right dorsiflexion, 5/5 plantarflexion Goal status: PARTIALLY MET  3.  Patient to report absent right thigh numbness for improved comfort Baseline: daily, with prolonged standing Goal status: IN PROGRESS    ASSESSMENT:  CLINICAL IMPRESSION: Notes ongoing right thigh parasthesias with prolonged tsanding/walking, relieved by seated rest.  Focus on HEP review with good recall requiring 25% cues in sequence and execution.  Progressed strength training to include blue resistance to clamshells and bridge to improve hip extensor and hip abd strength for improved proximal stability.  Addition to balance repertoire with tandem stance with increased unsteadiness with bias to RLE and gastroc stretching to improve flexibility for DF to improve foot clearance in swing.  Continued sessions to advance land-aquatic HEP for improved strength and mobility  OBJECTIVE IMPAIRMENTS: Abnormal gait, decreased activity tolerance, decreased balance, difficulty walking, decreased strength, and impaired sensation.   ACTIVITY LIMITATIONS: standing, stairs, and locomotion level  PARTICIPATION LIMITATIONS: cleaning and community activity  PERSONAL FACTORS: Time since onset of injury/illness/exacerbation and 1-2 comorbidities: PMH  are also affecting patient's functional outcome.   REHAB POTENTIAL: Good  CLINICAL DECISION MAKING: Evolving/moderate complexity  EVALUATION COMPLEXITY: Moderate  PLAN:  PT FREQUENCY: 1x/week  PT DURATION: 8 weeks  PLANNED INTERVENTIONS: Therapeutic exercises, Therapeutic activity, Neuromuscular re-education, Balance training, Gait training, Patient/Family education,  Self Care, Joint mobilization, Stair training, Vestibular training, Canalith repositioning, DME instructions, Aquatic Therapy, Dry Needling, Electrical stimulation, Spinal mobilization, Cryotherapy, Moist heat, Taping, and Manual therapy  PLAN FOR NEXT SESSION: Continued sessions to progress land  Aquatic HEP - ai chi?  12:37 PM, 12/23/22 M. Shary Decamp, PT, DPT Physical Therapist- [facility] Office Number: [phone]

## 2022-12-24 ENCOUNTER — Ambulatory Visit: Payer: Medicaid Other

## 2022-12-29 ENCOUNTER — Telehealth: Payer: Self-pay

## 2022-12-29 ENCOUNTER — Ambulatory Visit: Payer: Medicaid Other | Admitting: Rehabilitation

## 2022-12-29 ENCOUNTER — Encounter: Payer: Self-pay | Admitting: Rehabilitation

## 2022-12-29 DIAGNOSIS — R262 Difficulty in walking, not elsewhere classified: Secondary | ICD-10-CM

## 2022-12-29 DIAGNOSIS — M6281 Muscle weakness (generalized): Secondary | ICD-10-CM | POA: Diagnosis not present

## 2022-12-29 DIAGNOSIS — R2689 Other abnormalities of gait and mobility: Secondary | ICD-10-CM

## 2022-12-29 DIAGNOSIS — R2681 Unsteadiness on feet: Secondary | ICD-10-CM

## 2022-12-29 NOTE — Telephone Encounter (Signed)
Please see Mychart message from today 12/29/2022

## 2022-12-29 NOTE — Progress Notes (Unsigned)
PATIENT: Tamara Osborne DOB: [birth year]  REASON FOR VISIT: follow up HISTORY FROM: patient  No chief complaint on file.    HISTORY OF PRESENT ILLNESS:  12/29/22 ALL:  Tamara Osborne returns for follow up for OSA on CPAP. She continues to do well on therapy. She   Set up?   12/29/2021 ALL:  Tamara Osborne returns for follow up for OSA on CPAP. She was last seen 05/2021 and reported sleeping 12/13 hours daily. She was encouraged to continue close follow up with PCP, oncology and weight management. Since, she reports doing ok. She was doing really good with exercise and weight management until the end of August when they removed her port. She has not gotten back into the habit of exercising. She reports losing about 10-12lbs but has gained about 5lbs back. She is hoping to get back to the [facility] this week. She reports BP has been pretty good. She just took her amlodipine about 30 minutes ago. Home readings are usually 120-130/80-90. She continues to sleep very well. She is sleeping about 13 hours a day. She usually sleeps about 10 hours at night but then will nap for 2-3 hours every day. She does continue to have a cough and congestion. Some wheezing. She reports having significant allergies requiring allergy injections in past. She has not seen PCP for this.      06/25/2021 ALL:  Tamara Osborne is a 47 y.o. female here today for follow up for OSA on CPAP.  She is doing well on therapy. She is using CPAP most every night for about 10 hours. She reports sleeping very well. She feels that she is sleeping so deeply that she is having urinary incontinence at night. She has a history of cervical cancer with radiation and chemo that has effected functioning of bladder. She reports sleeping up to 12-13 hours some nights. She reports more condensation in mask and feels that she may have more chest congestion on nights where she has slept longer. This occurs about 1-2 times a month. She usually goes to bed  around 9pm. She likes to get up early in the morning but sometimes sleeps until 9:30. She has not been as active. She has gained 15 pounds. She does meet with her cancer support group regularly during the week. She likes to swim. She reports BP is usually well managed. She just took morning BP medication while waiting for out visit. She is no longer having to take clonidine.     HISTORY: (copied from Dr Teofilo Pod previous note)  Tamara Osborne is a 47 year old right-handed woman with an underlying medical history of palpitations, hypertension, neuropathy, diabetes, history of cervical cancer, anxiety, anemia, and obesity, who presents for follow-up consultation of her obstructive sleep apnea after interim home sleep testing and starting AutoPap therapy.  The patient is unaccompanied today.  I first met her at the request of Dr. Rinaldo Ratel on 11/21/2019, at which time she reported a prior diagnosis of sleep apnea but she has never been on CPAP therapy before as insurance did not cover her machine.  She had a home sleep test on 12/10/2020 which indicated severe sleep apnea with an AHI of 37.8/h, O2 nadir 86%.  She was advised to start AutoPap therapy, her set up date was 12/22/2020.   Today, 06/25/2020: I reviewed her AutoPap compliance data from 05/24/2020 through 06/23/2020, which is a total of 31 minutes, during which time she used her machine every night with percent use days greater than 4  hours at 90.3%, indicating excellent compliance, average usage of 6 hours and 14 minutes, residual AHI at goal at 0.2/h, leak and no, 95th percentile of pressure at 8 cm.  She reports having adjusted well to treatment.  She uses a DreamWear fullface mask, size small.  She likes this mask.  She has benefited from treatment quite significantly.  She reports better quality sleep, more sleep consolidation, less daytime somnolence to the point where she does not need to take any naps.  Previously, she would take longer naps.  She also  has noticed improvement in her nocturia.  She still has issues with urinary incontinence since her cervical cancer surgeries and she has benefited from physical therapy for this.  She does have to wear protection at night.  She has also noticed improvement in her blood pressure.  In fact, she has not needed to use her clonidine since starting her AutoPap.  She is working on weight loss, she has been on Victoza.  She is highly motivated to continue with her AutoPap.   The patient's allergies, current medications, family history, past medical history, past social history, past surgical history and problem list were reviewed and updated as appropriate.    Previously:    11/21/19: (She) was diagnosed with obstructive sleep apnea several years ago.  She reports significant daytime somnolence.  Prior sleep study results are not available for my review today.  She reports that sleep study testing was done in [place] about 7 or 8 years ago.  She qualified for CPAP therapy but her insurance would not cover it at the time.  She has had weight fluctuations.  She was diagnosed with cervical cancer earlier in the year and underwent surgery, radiation and chemotherapy.  She had lost weight, down to 180 pounds but gained a lot of weight back.  She is in the process of trying to lose weight.  She recently started Victoza. I reviewed your office note from 10/19/2019.  Her Epworth sleepiness score is 14 out of 24 today, fatigue severity score is 32 out of 63.  His snoring is loud and disturbs her boyfriend.  She lives with her boyfriend, they have 1 dog in the household.  She has 3 grown children, ages 71, 76 and 93.  She is currently not working.  She is hoping to go back to work after October.  She has an appointment with her oncologist in early October.  Bedtime is around 9 or 9:30 PM and rise time generally around 8 or 9.  She does wake up around 5 or 5:30 AM to let the dog out but goes back to bed.  She has nocturia about  once per average night, she denies recurrent morning headaches.  She has 1 niece with sleep apnea.  She is trying to reduce her caffeine intake.  She drinks approximately 1 serving per day currently.  She does not drink alcohol, she is a non-smoker.  She would be willing to get rechecked for sleep apnea and consider CPAP therapy.   REVIEW OF SYSTEMS: Out of a complete 14 system review of symptoms, the patient complains only of the following symptoms, urinary incontinence, weight gain and all other reviewed systems are negative.  ESS: 15/24, previously 14  ALLERGIES: Allergies  Allergen Reactions   Penicillins     Not sure a child allergy   Shellfish Allergy Swelling    Seafood also any kind   Sulfa Antibiotics     Not sure a child allergy  HOME MEDICATIONS: Outpatient Medications Prior to Visit  Medication Sig Dispense Refill   Accu-Chek Softclix Lancets lancets Use to check blood sugar TID. 100 each 2   amLODipine-valsartan (EXFORGE) 10-320 MG tablet Take 1 tablet by mouth daily. 90 tablet 0   azelastine (ASTELIN) 0.1 % nasal spray Place 1 spray into both nostrils 2 (two) times daily as needed. Use in each nostril as directed 30 mL 5   Blood Glucose Monitoring Suppl (ACCU-CHEK GUIDE) w/Device KIT Use to check blood sugar TID. 1 kit 0   chlorthalidone (HYGROTON) 25 MG tablet Take 1 tablet (25 mg total) by mouth daily. 90 tablet 3   Cholecalciferol 50 MCG (2000 UT) TABS Take by mouth.     doxycycline (VIBRA-TABS) 100 MG tablet Take 1 tablet (100 mg total) by mouth 2 (two) times daily. (Patient not taking: Reported on 12/21/2022) 20 tablet 1   EPINEPHrine (EPIPEN 2-PAK) 0.3 mg/0.3 mL IJ SOAJ injection Inject 0.3 mg into the muscle as needed for anaphylaxis. 2 each 1   fluticasone (FLONASE) 50 MCG/ACT nasal spray Place 2 sprays into both nostrils daily. 16 g 5   glucose blood (ACCU-CHEK GUIDE) test strip Use to check blood sugar TID. 100 each 2   ipratropium (ATROVENT) 0.06 % nasal  spray Place 2 sprays into both nostrils 3 (three) times daily as needed for rhinitis (drainage). 15 mL 5   levocetirizine (XYZAL) 5 MG tablet Take 1 tablet (5 mg total) by mouth every evening. 90 tablet 1   RESTASIS 0.05 % ophthalmic emulsion 1 drop 2 (two) times daily.     Semaglutide, 1 MG/DOSE, 4 MG/3ML SOPN Inject 1 mg as directed once a week. 3 mL 0   sertraline (ZOLOFT) 100 MG tablet Take 100 mg by mouth daily.     No facility-administered medications prior to visit.    PAST MEDICAL HISTORY: Past Medical History:  Diagnosis Date   Anemia    Angio-edema    Anxiety    Bartholin cyst    Cervical cancer (HCC)    Chronic kidney disease    Constipation    Depression    Deviated septum    Diabetes mellitus without complication (HCC)    Difficult intravenous access    used port for 05-09-2019 surgery   Fibroids    History of blood transfusion    2 units given 04-26-2019, has had total of 8 units since jan 2021   History of blood transfusion 1 unit given 05-30-19   iv fluids also given   History of cervical cancer 07/16/2022   History of kidney problems    History of radiation therapy 03/23/2019-05/04/2019   Cervical external beam   Dr Antony Blackbird   History of radiation therapy 05/09/2019-06/05/2019   vaginal brachytherapy   Dr Antony Blackbird   History of recent blood transfusion 05/16/2019   2 units given  per dr Lambert Mody at cancer center   Hypertension    Neuropathy    both thumbs   Obesity    Palpitations    PONV (postoperative nausea and vomiting)    Prediabetes    Preeclampsia 2006   Sleep apnea    no cpap used insurance would not cover, osa severe per pt   SOB (shortness of breath)    Squamous cell carcinoma of cervix (HCC) 03/23/2019   Swallowing difficulty    Symptomatic skin lesion 07/14/2022   Vitamin D deficiency 01/10/2020    PAST SURGICAL HISTORY: Past Surgical History:  Procedure Laterality Date  CESAREAN SECTION WITH BILATERAL TUBAL LIGATION  11/10/2004        DILATION AND CURETTAGE OF UTERUS  1997   IR IMAGING GUIDED PORT INSERTION  04/04/2019   IR REMOVAL TUN ACCESS W/ PORT W/O FL MOD SED  10/27/2021   OPERATIVE ULTRASOUND N/A 05/09/2019   Procedure: OPERATIVE ULTRASOUND;  Surgeon: Antony Blackbird, MD;  Location: [facility];  Service: Urology;  Laterality: N/A;   OPERATIVE ULTRASOUND N/A 05/15/2019   Procedure: OPERATIVE ULTRASOUND;  Surgeon: Antony Blackbird, MD;  Location: [facility];  Service: Urology;  Laterality: N/A;   OPERATIVE ULTRASOUND N/A 05/22/2019   Procedure: OPERATIVE ULTRASOUND;  Surgeon: Antony Blackbird, MD;  Location: [facility];  Service: Urology;  Laterality: N/A;   OPERATIVE ULTRASOUND N/A 05/29/2019   Procedure: OPERATIVE ULTRASOUND;  Surgeon: Antony Blackbird, MD;  Location: [facility];  Service: Urology;  Laterality: N/A;   OPERATIVE ULTRASOUND N/A 06/05/2019   Procedure: OPERATIVE ULTRASOUND;  Surgeon: Antony Blackbird, MD;  Location: [facility];  Service: Urology;  Laterality: N/A;   TANDEM RING INSERTION N/A 05/09/2019   Procedure: TANDEM RING INSERTION;  Surgeon: Antony Blackbird, MD;  Location: [facility];  Service: Urology;  Laterality: N/A;   TANDEM RING INSERTION N/A 05/15/2019   Procedure: TANDEM RING INSERTION;  Surgeon: Antony Blackbird, MD;  Location: [facility];  Service: Urology;  Laterality: N/A;   TANDEM RING INSERTION N/A 05/22/2019   Procedure: TANDEM RING INSERTION;  Surgeon: Antony Blackbird, MD;  Location: [facility];  Service: Urology;  Laterality: N/A;   TANDEM RING INSERTION N/A 05/29/2019   Procedure: TANDEM RING INSERTION;  Surgeon: Antony Blackbird, MD;  Location: [facility];  Service: Urology;  Laterality: N/A;   TANDEM RING INSERTION N/A 06/05/2019   Procedure: TANDEM RING INSERTION;  Surgeon: Antony Blackbird, MD;  Location: [facility];  Service: Urology;  Laterality: N/A;     FAMILY HISTORY: Family History  Problem Relation Age of Onset   Brain cancer Mother        lung cancer to brain   Hypertension Mother    Cancer Mother    Depression Mother    Anxiety disorder Mother    Diabetes Father    Kidney disease Father    Cancer Father        kidney cancer   Heart failure Sister    Heart attack Sister    Heart failure Paternal Aunt    Cancer Maternal Grandmother    Kidney failure Maternal Grandfather     SOCIAL HISTORY: Social History   Socioeconomic History   Marital status: Single    Spouse name: Not on file   Number of children: 4   Years of education: Not on file   Highest education level: Not on file  Occupational History   Not on file  Tobacco Use   Smoking status: Never   Smokeless tobacco: Never  Vaping Use   Vaping status: Never Used  Substance and Sexual Activity   Alcohol use: No   Drug use: No   Sexual activity: Not Currently    Birth control/protection: Surgical  Other Topics Concern   Not on file  Social History Narrative   Lives with her boyfriend   Social Determinants of Health   Financial Resource Strain: Not on file  Food Insecurity: No Food Insecurity (10/08/2022)   Hunger Vital Sign    Worried About Running Out of Food in  the Last Year: Never true    Ran Out of Food in the Last Year: Never true  Transportation Needs: No Transportation Needs (10/08/2022)   PRAPARE - Administrator, Civil Service (Medical): No    Lack of Transportation (Non-Medical): No  Physical Activity: Insufficiently Active (10/08/2022)   Exercise Vital Sign    Days of Exercise per Week: 2 days    Minutes of Exercise per Session: 40 min  Stress: Not on file  Social Connections: Not on file  Intimate Partner Violence: Not on file     PHYSICAL EXAM  There were no vitals filed for this visit.   There is no height or weight on file to calculate BMI.  Generalized: Well developed, in no acute distress  Cardiology: normal rate  and rhythm, no murmur noted Respiratory: clear to auscultation bilaterally  Neurological examination  Mentation: Alert oriented to time, place, history taking. Follows all commands speech and language fluent Cranial nerve II-XII: Pupils were equal round reactive to light. Extraocular movements were full, visual field were full  Motor: The motor testing reveals 5 over 5 strength of all 4 extremities. Good symmetric motor tone is noted throughout.  Gait and station: Gait is normal.    DIAGNOSTIC DATA (LABS, IMAGING, TESTING) - I reviewed patient records, labs, notes, testing and imaging myself where available.      No data to display           Lab Results  Component Value Date   WBC 9.3 08/06/2022   HGB 15.3 08/06/2022   HCT 45.3 08/06/2022   MCV 83 08/06/2022   PLT 326 08/06/2022      Component Value Date/Time   NA 140 12/09/2022 1203   K 4.3 12/09/2022 1203   CL 103 12/09/2022 1203   CO2 20 08/06/2022 1158   GLUCOSE 94 08/06/2022 1158   GLUCOSE 186 (H) 10/23/2021 1218   BUN 32 (A) 12/09/2022 1203   CREATININE 1.6 (A) 12/09/2022 1203   CREATININE 1.50 (H) 08/06/2022 1158   CREATININE 1.44 (H) 12/12/2020 1108   CALCIUM 10.2 12/09/2022 1203   PROT 7.8 08/06/2022 1158   ALBUMIN 4.4 12/09/2022 1203   ALBUMIN 4.5 08/06/2022 1158   AST 27 08/06/2022 1158   AST 49 (H) 12/12/2020 1108   ALT 48 (H) 08/06/2022 1158   ALT 70 (H) 12/12/2020 1108   ALKPHOS 97 08/06/2022 1158   BILITOT 0.3 08/06/2022 1158   BILITOT 1.0 12/12/2020 1108   GFRNONAA 56 (L) 10/23/2021 1218   GFRNONAA 46 (L) 12/12/2020 1108   GFRAA 55 (L) 09/20/2019 1106   Lab Results  Component Value Date   CHOL 209 (H) 08/06/2022   HDL 41 08/06/2022   LDLCALC 138 (H) 08/06/2022   TRIG 168 (H) 08/06/2022   CHOLHDL 5.1 (H) 08/06/2022   Lab Results  Component Value Date   HGBA1C 5.9 (H) 08/06/2022   No results found for: "VITAMINB12" Lab Results  Component Value Date   TSH 1.850 08/06/2022      ASSESSMENT AND PLAN 47 y.o. year old female  has a past medical history of Anemia, Angio-edema, Anxiety, Bartholin cyst, Cervical cancer (HCC), Chronic kidney disease, Constipation, Depression, Deviated septum, Diabetes mellitus without complication (HCC), Difficult intravenous access, Fibroids, History of blood transfusion, History of blood transfusion (1 unit given 05-30-19), History of cervical cancer (07/16/2022), History of kidney problems, History of radiation therapy (03/23/2019-05/04/2019), History of radiation therapy (05/09/2019-06/05/2019), History of recent blood transfusion (05/16/2019), Hypertension, Neuropathy, Obesity, Palpitations, PONV (  postoperative nausea and vomiting), Prediabetes, Preeclampsia (2006), Sleep apnea, SOB (shortness of breath), Squamous cell carcinoma of cervix (HCC) (03/23/2019), Swallowing difficulty, Symptomatic skin lesion (07/14/2022), and Vitamin D deficiency (01/10/2020). here with   No diagnosis found.     EMON OSUCH is doing well on CPAP therapy. Compliance report reveals excellent compliance. She was encouraged to continue using CPAP nightly and for greater than 4 hours each night. We have discussed cough, chest congestion and blood pressures. She was encouraged to follow up closely with PCP. We could consider stimulant for EDS if blood pressure managed. She will continue to increase physical activity. She is followed by weight management and will continue focusing on healthy weight. She will monitor BP at home and follow up with PCP if readings are not consistently below 140/90. We will update supply orders as indicated. Risks of untreated sleep apnea review and education materials provided. Healthy lifestyle habits encouraged. She will follow up in 6 months, sooner if needed. She verbalizes understanding and agreement with this plan.    No orders of the defined types were placed in this encounter.    No orders of the defined types were placed in  this encounter.     Shawnie Dapper, FNP-C 12/29/2022, 12:21 PM [facility] [address] [place], [place] [address] [phone]

## 2022-12-29 NOTE — Therapy (Signed)
OUTPATIENT PHYSICAL THERAPY NEURO TREATMENT   Patient Name: Tamara Osborne MRN: [id number] DOB:[birth year], 47 y.o., female Today's Date: 12/29/2022   PCP: Alyson Reedy, FNP REFERRING PROVIDER: Chilton Si, MD      END OF SESSION:  PT End of Session - 12/29/22 0827     Visit Number 9    Number of Visits 14    Date for PT Re-Evaluation 02/04/23    Authorization Type [place] Medicaid [employer] (Approved 5 visits from 10/11-12/5)    Authorization - Visit Number 3    Authorization - Number of Visits 5    PT Start Time 1055    PT Stop Time 1140    PT Time Calculation (min) 45 min    Equipment Utilized During Treatment Other (comment)   floatation devices as needed for safety   Activity Tolerance Patient tolerated treatment well    Behavior During Therapy WFL for tasks assessed/performed                 Past Medical History:  Diagnosis Date   Anemia    Angio-edema    Anxiety    Bartholin cyst    Cervical cancer (HCC)    Chronic kidney disease    Constipation    Depression    Deviated septum    Diabetes mellitus without complication (HCC)    Difficult intravenous access    used port for 05-09-2019 surgery   Fibroids    History of blood transfusion    2 units given 04-26-2019, has had total of 8 units since jan 2021   History of blood transfusion 1 unit given 05-30-19   iv fluids also given   History of cervical cancer 07/16/2022   History of kidney problems    History of radiation therapy 03/23/2019-05/04/2019   Cervical external beam   Dr Antony Blackbird   History of radiation therapy 05/09/2019-06/05/2019   vaginal brachytherapy   Dr Antony Blackbird   History of recent blood transfusion 05/16/2019   2 units given  per dr Lambert Mody at cancer center   Hypertension    Neuropathy    both thumbs   Obesity    Palpitations    PONV (postoperative nausea and vomiting)    Prediabetes    Preeclampsia 2006   Sleep apnea    no cpap used insurance would not cover, osa  severe per pt   SOB (shortness of breath)    Squamous cell carcinoma of cervix (HCC) 03/23/2019   Swallowing difficulty    Symptomatic skin lesion 07/14/2022   Vitamin D deficiency 01/10/2020   Past Surgical History:  Procedure Laterality Date   CESAREAN SECTION WITH BILATERAL TUBAL LIGATION  11/10/2004       DILATION AND CURETTAGE OF UTERUS  1997   IR IMAGING GUIDED PORT INSERTION  04/04/2019   IR REMOVAL TUN ACCESS W/ PORT W/O FL MOD SED  10/27/2021   OPERATIVE ULTRASOUND N/A 05/09/2019   Procedure: OPERATIVE ULTRASOUND;  Surgeon: Antony Blackbird, MD;  Location: [facility];  Service: Urology;  Laterality: N/A;   OPERATIVE ULTRASOUND N/A 05/15/2019   Procedure: OPERATIVE ULTRASOUND;  Surgeon: Antony Blackbird, MD;  Location: [facility];  Service: Urology;  Laterality: N/A;   OPERATIVE ULTRASOUND N/A 05/22/2019   Procedure: OPERATIVE ULTRASOUND;  Surgeon: Antony Blackbird, MD;  Location: [facility];  Service: Urology;  Laterality: N/A;   OPERATIVE ULTRASOUND N/A 05/29/2019   Procedure: OPERATIVE ULTRASOUND;  Surgeon: Antony Blackbird, MD;  Location: [facility];  Service: Urology;  Laterality: N/A;   OPERATIVE ULTRASOUND N/A 06/05/2019   Procedure: OPERATIVE ULTRASOUND;  Surgeon: Antony Blackbird, MD;  Location: [facility];  Service: Urology;  Laterality: N/A;   TANDEM RING INSERTION N/A 05/09/2019   Procedure: TANDEM RING INSERTION;  Surgeon: Antony Blackbird, MD;  Location: [facility];  Service: Urology;  Laterality: N/A;   TANDEM RING INSERTION N/A 05/15/2019   Procedure: TANDEM RING INSERTION;  Surgeon: Antony Blackbird, MD;  Location: [facility];  Service: Urology;  Laterality: N/A;   TANDEM RING INSERTION N/A 05/22/2019   Procedure: TANDEM RING INSERTION;  Surgeon: Antony Blackbird, MD;  Location: [facility];  Service: Urology;  Laterality: N/A;   TANDEM RING INSERTION N/A 05/29/2019    Procedure: TANDEM RING INSERTION;  Surgeon: Antony Blackbird, MD;  Location: [facility];  Service: Urology;  Laterality: N/A;   TANDEM RING INSERTION N/A 06/05/2019   Procedure: TANDEM RING INSERTION;  Surgeon: Antony Blackbird, MD;  Location: [facility];  Service: Urology;  Laterality: N/A;   Patient Active Problem List   Diagnosis Date Noted   HSV-2 infection 07/13/2022   Dyspareunia in female 10/23/2021   Hyperlipidemia associated with type 2 diabetes mellitus (HCC) 07/03/2021   OSA (obstructive sleep apnea) 07/31/2020   Other hyperlipidemia 01/10/2020   Insulin resistance 01/10/2020   Class 2 severe obesity with serious comorbidity and body mass index (BMI) of 39.0 to 39.9 in adult [facility]) 01/10/2020   Peripheral 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) 04/25/2019   CKD (chronic kidney disease), symptom management only, stage 3 (moderate) (HCC) 03/30/2019   Generalized anxiety disorder 03/30/2019   Hypertension associated with type 2 diabetes mellitus (HCC)    Squamous cell carcinoma of cervix (HCC) 03/23/2019   Deficiency anemia 03/22/2019    ONSET DATE: 2 years  REFERRING DIAG: G62.9 (ICD-10-CM) - Neuropathy R26.89 (ICD-10-CM) - Balance problem  THERAPY DIAG:  Muscle weakness (generalized)  Unsteadiness on feet  Difficulty in walking, not elsewhere classified  Other abnormalities of gait and mobility  Rationale for Evaluation and Treatment: Rehabilitation  SUBJECTIVE:                                                                                                                                                                                             SUBJECTIVE STATEMENT: Pt reports feet are stiff today, otherwise doing pretty good.    Pt accompanied by: self  PERTINENT HISTORY: 47 y.o. female with a hx of hypertension, preeclampsia, hyperlipidemia, OSA, diabetes, squamous cell carcinoma of cervix s/p radiation and chemotherapy, morbid  obesity.  For exercise she was previously in aquatic therapy, now participates in Silver sneakers. She states that every 6 months she has recurring issues with capsulitis/paronychia in her feet which has affected her physical activity. She follows with her podiatrist  PAIN:  Are you having pain?  No pain, right hip tightness from driving long distance  PRECAUTIONS: None  RED FLAGS: None   WEIGHT BEARING RESTRICTIONS: No  FALLS: Has patient fallen in last 6 months? No  LIVING ENVIRONMENT: Lives with: lives with their partner Lives in: House/apartment Stairs:  split foyer, 6 steps up/down, bilat HR Has following equipment at home: None  PLOF: Independent  PATIENT GOALS:   OBJECTIVE:   Todays Treatment: 12/29/22  Patient seen for aquatic therapy today.  Treatment took place in water 3.6-4.0 feet deep depending upon activity.  Pt entered and exited the pool via stairs using B rails as needed for support.  Pool temp approx 92 deg.    Warm Up:  Walking approx 18' forwards x 4 laps, backwards x 4 laps and side stepping with small barbells abd when feet abd, squat then adducting LEs/UEs x 2 laps.    Forward marching x 2 laps holding smaller barbells with cues for slower motion to emphasize balance challenge>another 2 laps adding in opposite UE movements.  Again, cues for slower movements.  Standing holding small barbells for support performing hip flex/ext (with knee ext) x 10 reps on each side.  While performing on RLE (standing on LLE) notice task is easier, so had her perform without barbells.  She did attempt when in stance on RLE however was too difficult so gave barbells back.    Ai Chi postures for core strengthening and balance:  "Enclosing" x 10 reps, "Accepting" x 10 reps each, and "Balancing" x 10 reps each. Again note some difficulty with stance on RLE so did modify this and have her hold wall with one UE while performing.    Discussed numbness/tightness in R leg during  session.  PT demo'd standing R ITB stretch which she was able to return demo however she only reports mild stretch.  Ambulated over to pool bench with feet supported on step and had her perform seated figure 4 stretch which did elicit stretch in same area per pt report.  She reports she may have this one for HEP but can't remember.  Also had her perform R quad stretch in standing with R foot on pool bench with tactile and verbal cues for upright posture and decreased trunk rotation away from R thigh.  She reports this also feels very tight so verbalized how she could perform this safely at home.  Pt verbalize understanding.    Pt requires buoyancy of water for support for reduced fall risk and for unloading/reduced stress on joints as pt able to tolerate increased standing and ambulation in water compared to that on land; viscosity of water is needed for resistance for strengthening and current of water provides perturbations for challenge for balance training       PATIENT EDUCATION: Education details: Goal of developing aquatic HEP  Person educated: Patient Education method: Explanation Education comprehension: verbalized understanding  HOME EXERCISE PROGRAM: Access Code: [email or web address] URL: [email or web address] Date: 11/26/2022 Prepared by: [facility] - Outpatient  Rehab - [facility]  Exercises - Seated Ankle Dorsiflexion with Resistance  - 1 x daily - 7 x weekly - 3 sets - 10 reps - Standing Balance in Corner  - 1 x daily - 7 x weekly -  1 sets - 5 reps - Standing Balance in Corner with Eyes Closed  - 1 x daily - 7 x weekly - 1 sets - 5 reps - Sidelying Hip Abduction  - 1 x daily - 5 x weekly - 3 sets - 10 reps - Supine Bridge with Resistance Band  - 1 x daily - 7 x weekly - 3 sets - 10 reps - Hooklying Clamshell with Resistance  - 1 x daily - 7 x weekly - 3 sets - 10 reps - Hooklying Single Knee to Chest Stretch  - 1 x daily - 7 x weekly - 1 sets - 5 reps - 15 sec hold -  Tandem Stance in Corner  - 1 x daily - 7 x weekly - 3 sets - 30 sec hold - Standing Gastroc Stretch  - 1 x daily - 7 x weekly - 3 sets - 30-60 sec hold  ---------------------------------------------------------------------------- Objective measures below taken at initial evaluation:  DIAGNOSTIC FINDINGS: N/A for episode  COGNITION: Overall cognitive status:  WFL, pt reports some memory deficits since chemotherapy   SENSATION: Light touch: Impaired  dorsum and plantar surfaces bilateral feet  COORDINATION: WFL  EDEMA:  Some swelling in bilat feet appreciated  MUSCLE TONE: WNL     POSTURE: No Significant postural limitations  LOWER EXTREMITY ROM:     Active  Right Eval Left Eval  Hip flexion    Hip extension    Hip abduction    Hip adduction    Hip internal rotation    Hip external rotation    Knee flexion 115 115  Knee extension 0 0  Ankle dorsiflexion 15 15  Ankle plantarflexion 30 30  Ankle inversion    Ankle eversion     (Blank rows = not tested)  LOWER EXTREMITY MMT:    MMT Right Eval Left Eval  Hip flexion 5 5  Hip extension    Hip abduction    Hip adduction    Hip internal rotation    Hip external rotation    Knee flexion 5 5  Knee extension 5 5  Ankle dorsiflexion 3 4  Ankle plantarflexion 4 4  Ankle inversion    Ankle eversion    (Blank rows = not tested)  BED MOBILITY:  indep  TRANSFERS: Assistive device utilized: None  Sit to stand: Complete Independence Stand to sit: Complete Independence Chair to chair: Complete Independence Floor:  NT    CURB:  Level of Assistance: Modified independence Assistive device utilized: None Curb Comments: increased time to set body position  STAIRS: Level of Assistance: Modified independence Stair Negotiation Technique: Step to Pattern Alternating Pattern  with Bilateral Rails Number of Stairs: 12  Height of Stairs: 4-6  Comments:   GAIT: Gait pattern: decreased stride length and  antalgic Distance walked:  Assistive device utilized: None Level of assistance: Complete Independence Comments:   FUNCTIONAL TESTS:  5 times sit to stand: 14.53 sec Timed up and go (TUG): NT Berg Balance Scale: 49/56  M-CTSIB  Condition 1: Firm Surface, EO 30 Sec, Normal Sway  Condition 2: Firm Surface, EC 30 Sec, Mild and Moderate Sway  Condition 3: Foam Surface, EO 30 Sec, Mild Sway  Condition 4: Foam Surface, EC 30 Sec, Mild and Moderate Sway         GOALS: Goals reviewed with patient? Yes  SHORT TERM GOALS: Target date: 01/07/2023     Patient will be independent in HEP to improve functional outcomes Baseline: Goal status: MET, 11/26/2022  2.  Demo improved balance and reduce risk for falls per score 52/56 Berg Balance Test Baseline: 49/56; (12/10/22) 55/56 Goal status: MET  3.  Demo improved ambulation tolerance as evidenced by distance of 1,500 ft during  Baseline: initial 630 ft 11/05/2022  Goal status:IN PROGRESS  4.  Demo improved postural stability for improved safety with ADL as evidenced by normal-mild sway x 30 sec condition 4 M-CTSIB.  Baseline: moderate  Goal status: IN PROGRESS  LONG TERM GOALS: Target date: 02/04/2023      Patient to be independent with advanced HEP to include land/aquatic activities as indicated Baseline:  Goal status: IN PROGRESS  2.  Demo 4+/5 bilateral ankle dorsiflexion and plantarflexion strength for improved stability Baseline: (12/10/22) 3+/5 right dorsiflexion, 5/5 plantarflexion Goal status: PARTIALLY MET  3.  Patient to report absent right thigh numbness for improved comfort Baseline: daily, with prolonged standing Goal status: IN PROGRESS    ASSESSMENT:  CLINICAL IMPRESSION: Pt tolerated session very well with improvement in R hip stiffness.  She continues to have decreased balance in R SLS tasks but overall is able to perform with only light external support.  Performed piriformis and quad stretch in pool  that she could perform on land to assist with tightness and pain in R  hip.  Will discuss POC with primary PT as she only has 2 visits approved.    OBJECTIVE IMPAIRMENTS: Abnormal gait, decreased activity tolerance, decreased balance, difficulty walking, decreased strength, and impaired sensation.   ACTIVITY LIMITATIONS: standing, stairs, and locomotion level  PARTICIPATION LIMITATIONS: cleaning and community activity  PERSONAL FACTORS: Time since onset of injury/illness/exacerbation and 1-2 comorbidities: PMH  are also affecting patient's functional outcome.   REHAB POTENTIAL: Good  CLINICAL DECISION MAKING: Evolving/moderate complexity  EVALUATION COMPLEXITY: Moderate  PLAN:  PT FREQUENCY: 1x/week  PT DURATION: 8 weeks  PLANNED INTERVENTIONS: Therapeutic exercises, Therapeutic activity, Neuromuscular re-education, Balance training, Gait training, Patient/Family education, Self Care, Joint mobilization, Stair training, Vestibular training, Canalith repositioning, DME instructions, Aquatic Therapy, Dry Needling, Electrical stimulation, Spinal mobilization, Cryotherapy, Moist heat, Taping, and Manual therapy  PLAN FOR NEXT SESSION: Continued sessions to progress land  Aquatic HEP :  Have formal HEP ready as she may only have one more pool visit left.    Harriet Butte, PT, MPT [facility] [address] [place], [place], [address] Phone: [phone]   Fax:  [phone] 12/29/22, 11:52 AM

## 2022-12-29 NOTE — Patient Instructions (Signed)

## 2022-12-30 ENCOUNTER — Encounter: Payer: Self-pay | Admitting: Family Medicine

## 2022-12-30 ENCOUNTER — Ambulatory Visit (INDEPENDENT_AMBULATORY_CARE_PROVIDER_SITE_OTHER): Payer: Medicaid Other | Admitting: Family Medicine

## 2022-12-30 VITALS — BP 118/77 | HR 97 | Ht 61.0 in | Wt 231.0 lb

## 2022-12-30 DIAGNOSIS — G4733 Obstructive sleep apnea (adult) (pediatric): Secondary | ICD-10-CM

## 2023-01-05 ENCOUNTER — Encounter (INDEPENDENT_AMBULATORY_CARE_PROVIDER_SITE_OTHER): Payer: Self-pay | Admitting: Adult Health

## 2023-01-05 ENCOUNTER — Ambulatory Visit (INDEPENDENT_AMBULATORY_CARE_PROVIDER_SITE_OTHER): Payer: Medicaid Other | Admitting: Adult Health

## 2023-01-05 VITALS — BP 114/74 | HR 94 | Temp 97.9°F | Ht 61.0 in | Wt 228.0 lb

## 2023-01-05 DIAGNOSIS — E1122 Type 2 diabetes mellitus with diabetic chronic kidney disease: Secondary | ICD-10-CM | POA: Diagnosis not present

## 2023-01-05 DIAGNOSIS — J302 Other seasonal allergic rhinitis: Secondary | ICD-10-CM

## 2023-01-05 DIAGNOSIS — I152 Hypertension secondary to endocrine disorders: Secondary | ICD-10-CM

## 2023-01-05 DIAGNOSIS — N183 Chronic kidney disease, stage 3 unspecified: Secondary | ICD-10-CM

## 2023-01-05 DIAGNOSIS — E669 Obesity, unspecified: Secondary | ICD-10-CM

## 2023-01-05 DIAGNOSIS — E1159 Type 2 diabetes mellitus with other circulatory complications: Secondary | ICD-10-CM | POA: Diagnosis not present

## 2023-01-05 DIAGNOSIS — Z6841 Body Mass Index (BMI) 40.0 and over, adult: Secondary | ICD-10-CM

## 2023-01-05 DIAGNOSIS — Z7985 Long-term (current) use of injectable non-insulin antidiabetic drugs: Secondary | ICD-10-CM

## 2023-01-05 DIAGNOSIS — N1832 Chronic kidney disease, stage 3b: Secondary | ICD-10-CM

## 2023-01-05 MED ORDER — SEMAGLUTIDE (1 MG/DOSE) 4 MG/3ML ~~LOC~~ SOPN: 1.0000 mg | PEN_INJECTOR | SUBCUTANEOUS | 0 refills | Status: DC

## 2023-01-05 NOTE — Progress Notes (Signed)
WEIGHT SUMMARY AND BIOMETRICS  Vitals Temp: 97.9 F (36.6 C) BP: 114/74 Pulse Rate: 94 SpO2: 96 %   Anthropometric Measurements Height: 5\' 1"  (1.549 m) Weight: 228 lb (103.4 kg) BMI (Calculated): 43.1 Weight at Last Visit: 228 lb Weight Lost Since Last Visit: 0 Weight Gained Since Last Visit: 0 Starting Weight: 208 lb Total Weight Loss (lbs): 0 lb (0 kg)   Body Composition  Body Fat %: 47 % Fat Mass (lbs): 107.4 lbs Muscle Mass (lbs): 115 lbs Total Body Water (lbs): 84.6 lbs Visceral Fat Rating : 14   Other Clinical Data Fasting: no Labs: no Today's Visit #: 48 Starting Date: 09/20/19    Chief Complaint:   OBESITY Tamara Osborne is here to discuss her progress with her obesity treatment plan. She is on the keeping a food journal and adhering to recommended goals of 1450-1550 calories and 95 protein and states she is following her eating plan approximately 90 % of the time. She states she is exercising Pool and PT 45 minutes 2 times per week.   Interim History:  She has been taking weekly Ozempic 0.75mg  (54 clicks) since Spring 2024 She reports increased appetite She denies SE on current dose of GLP-1 therapy  She is frustrated with weigh loss plateau - current weight 228 lbs with corresponding BMI 41.8  Subjective:   1. Seasonal allergies She endorses nasal congestion and watery eyes the last 1.5 weeks. She reports compliance of Xyxal 5mg  nightly, Flonase 2 sprays each nare each morning, and increased use of PRN Astelin 0.% 1 spray BID- estimates to use 1-2 times per week.  2. Hypertension associated with type 2 diabetes mellitus (HCC) BP at goal at OV She is on  EPINEPHrine (EPIPEN 2-PAK) 0.3 mg/0.3 mL IJ SOAJ injection  chlorthalidone (HYGROTON) 25 MG tablet  amLODipine-valsartan (EXFORGE) 10-320 MG tablet  Semaglutide, 1 MG/DOSE, 4 MG/3ML SOPN   She reports home readings SBP 110-120s DBP 70s She denies CP with exertion. 3. Type 2 diabetes  mellitus with stage 3b chronic kidney disease, without long-term current use of insulin (HCC) Lab Results  Component Value Date   HGBA1C 5.9 (H) 08/06/2022   HGBA1C 5.3 07/16/2022   HGBA1C 5.4 06/05/2021     4. CKD (chronic kidney disease), symptom management only, stage 3 (moderate) (HCC) She is on daily Exforge 10/320mg  BP excellent at OV She reports home readings SBP 110-120s DBP 70s She denies CP with exertion.  Assessment/Plan:   1. Seasonal allergies Increase water intake and continue   2. Hypertension associated with type 2 diabetes mellitus (HCC) Continue EPINEPHrine (EPIPEN 2-PAK) 0.3 mg/0.3 mL IJ SOAJ injection  chlorthalidone (HYGROTON) 25 MG tablet  amLODipine-valsartan (EXFORGE) 10-320 MG tablet  Semaglutide, 1 MG/DOSE, 4 MG/3ML SOPN   Continue to monitor home BP readings  3. Type 2 diabetes mellitus with stage 3b chronic kidney disease, without long-term current use of insulin (HCC) Refill - Semaglutide, 1 MG/DOSE, 4 MG/3ML SOPN; Inject 1 mg as directed once a week.  Dispense: 3 mL; Refill: 0  4. CKD (chronic kidney disease), symptom management only, stage 3 (moderate) (HCC) Remain well hydrated and continue to avoid Nephrotoxic substances  5. BMI 40.0-44.9, adult (HCC), Current BMI 43.1  Tamara Osborne is currently in the action stage of change. As such, her goal is to continue with weight loss efforts. She has agreed to keeping a food journal and adhering to recommended goals of 1450-1550 calories and 95+ protein.   Exercise goals: All adults should  avoid inactivity. Some physical activity is better than none, and adults who participate in any amount of physical activity gain some health benefits. Adults should also include muscle-strengthening activities that involve all major muscle groups on 2 or more days a week.  Behavioral modification strategies: increasing lean protein intake, decreasing simple carbohydrates, increasing vegetables, increasing water intake,  decreasing liquid calories, no skipping meals, meal planning and cooking strategies, keeping healthy foods in the home, ways to avoid boredom eating, ways to avoid night time snacking, and planning for success.  Tamara Osborne has agreed to follow-up with our clinic in 4 weeks. She was informed of the importance of frequent follow-up visits to maximize her success with intensive lifestyle modifications for her multiple health conditions.   Objective:   Blood pressure 114/74, pulse 94, temperature 97.9 F (36.6 C), height 5\' 1"  (1.549 m), weight 228 lb (103.4 kg), SpO2 96%. Body mass index is 43.08 kg/m.  General: Cooperative, alert, well developed, in no acute distress. HEENT: Conjunctivae and lids unremarkable. Cardiovascular: Regular rhythm.  Lungs: Normal work of breathing. Neurologic: No focal deficits.   Lab Results  Component Value Date   CREATININE 1.6 (A) 12/09/2022   BUN 32 (A) 12/09/2022   NA 140 12/09/2022   K 4.3 12/09/2022   CL 103 12/09/2022   CO2 20 08/06/2022   Lab Results  Component Value Date   ALT 48 (H) 08/06/2022   AST 27 08/06/2022   ALKPHOS 97 08/06/2022   BILITOT 0.3 08/06/2022   Lab Results  Component Value Date   HGBA1C 5.9 (H) 08/06/2022   HGBA1C 5.3 07/16/2022   HGBA1C 5.4 06/05/2021   HGBA1C 5.2 01/13/2021   HGBA1C 7.5 (H) 10/18/2020   Lab Results  Component Value Date   INSULIN 40.3 (H) 09/20/2019   Lab Results  Component Value Date   TSH 1.850 08/06/2022   Lab Results  Component Value Date   CHOL 209 (H) 08/06/2022   HDL 41 08/06/2022   LDLCALC 138 (H) 08/06/2022   TRIG 168 (H) 08/06/2022   CHOLHDL 5.1 (H) 08/06/2022   Lab Results  Component Value Date   VD25OH 66.26 06/05/2021   VD25OH 59.37 10/18/2020   VD25OH 45.60 05/20/2020   Lab Results  Component Value Date   WBC 9.3 08/06/2022   HGB 15.3 08/06/2022   HCT 45.3 08/06/2022   MCV 83 08/06/2022   PLT 326 08/06/2022   Lab Results  Component Value Date   IRON 17 (L)  03/27/2019   TIBC 283 03/27/2019   FERRITIN 32 03/27/2019    Attestation Statements:   Reviewed by clinician on day of visit: allergies, medications, problem list, medical history, surgical history, family history, social history, and previous encounter notes.  I have reviewed the above documentation for accuracy and completeness, and I agree with the above. -  Joffre Lucks d. Ytzel Gubler, NP-C

## 2023-01-07 ENCOUNTER — Ambulatory Visit: Payer: Medicaid Other | Attending: Family Medicine

## 2023-01-07 DIAGNOSIS — R262 Difficulty in walking, not elsewhere classified: Secondary | ICD-10-CM | POA: Insufficient documentation

## 2023-01-07 DIAGNOSIS — R2689 Other abnormalities of gait and mobility: Secondary | ICD-10-CM | POA: Diagnosis present

## 2023-01-07 DIAGNOSIS — R2681 Unsteadiness on feet: Secondary | ICD-10-CM | POA: Insufficient documentation

## 2023-01-07 DIAGNOSIS — M6281 Muscle weakness (generalized): Secondary | ICD-10-CM | POA: Insufficient documentation

## 2023-01-07 NOTE — Therapy (Signed)
OUTPATIENT PHYSICAL THERAPY NEURO TREATMENT   Patient Name: Tamara Osborne MRN: [id number] DOB:1975-08-21, 47 y.o., female Today's Date: 01/07/2023   PCP: Alyson Reedy, FNP REFERRING PROVIDER: Chilton Si, MD      END OF SESSION:  PT End of Session - 01/07/23 1055     Visit Number 10    Number of Visits 14    Date for PT Re-Evaluation 02/04/23    Authorization Type [place] [employer] (Approved 5 visits from 10/11-12/5)    Authorization - Visit Number 4    Authorization - Number of Visits 5    PT Start Time 1100    PT Stop Time 1145    PT Time Calculation (min) 45 min    Equipment Utilized During Treatment Other (comment)   floatation devices as needed for safety   Activity Tolerance Patient tolerated treatment well    Behavior During Therapy WFL for tasks assessed/performed                 Past Medical History:  Diagnosis Date   Anemia    Angio-edema    Anxiety    Bartholin cyst    Cervical cancer (HCC)    Chronic kidney disease    Constipation    Depression    Deviated septum    Diabetes mellitus without complication (HCC)    Difficult intravenous access    used port for 05-09-2019 surgery   Fibroids    History of blood transfusion    2 units given 04-26-2019, has had total of 8 units since jan 2021   History of blood transfusion 1 unit given 05-30-19   iv fluids also given   History of cervical cancer 07/16/2022   History of kidney problems    History of radiation therapy 03/23/2019-05/04/2019   Cervical external beam   Dr Antony Blackbird   History of radiation therapy 05/09/2019-06/05/2019   vaginal brachytherapy   Dr Antony Blackbird   History of recent blood transfusion 05/16/2019   2 units given  per dr Lambert Mody at cancer center   Hypertension    Neuropathy    both thumbs   Obesity    Palpitations    PONV (postoperative nausea and vomiting)    Prediabetes    Preeclampsia 2006   Sleep apnea    no cpap used insurance would not cover, osa  severe per pt   SOB (shortness of breath)    Squamous cell carcinoma of cervix (HCC) 03/23/2019   Swallowing difficulty    Symptomatic skin lesion 07/14/2022   Vitamin D deficiency 01/10/2020   Past Surgical History:  Procedure Laterality Date   CESAREAN SECTION WITH BILATERAL TUBAL LIGATION  11/10/2004       DILATION AND CURETTAGE OF UTERUS  1997   IR IMAGING GUIDED PORT INSERTION  04/04/2019   IR REMOVAL TUN ACCESS W/ PORT W/O FL MOD SED  10/27/2021   OPERATIVE ULTRASOUND N/A 05/09/2019   Procedure: OPERATIVE ULTRASOUND;  Surgeon: Antony Blackbird, MD;  Location: [facility] [place];  Service: Urology;  Laterality: N/A;   OPERATIVE ULTRASOUND N/A 05/15/2019   Procedure: OPERATIVE ULTRASOUND;  Surgeon: Antony Blackbird, MD;  Location: [facility];  Service: Urology;  Laterality: N/A;   OPERATIVE ULTRASOUND N/A 05/22/2019   Procedure: OPERATIVE ULTRASOUND;  Surgeon: Antony Blackbird, MD;  Location: [facility];  Service: Urology;  Laterality: N/A;   OPERATIVE ULTRASOUND N/A 05/29/2019   Procedure: OPERATIVE ULTRASOUND;  Surgeon: Antony Blackbird, MD;  Location: [facility];  Service: Urology;  Laterality: N/A;   OPERATIVE ULTRASOUND N/A 06/05/2019   Procedure: OPERATIVE ULTRASOUND;  Surgeon: Antony Blackbird, MD;  Location: [facility];  Service: Urology;  Laterality: N/A;   TANDEM RING INSERTION N/A 05/09/2019   Procedure: TANDEM RING INSERTION;  Surgeon: Antony Blackbird, MD;  Location: [facility];  Service: Urology;  Laterality: N/A;   TANDEM RING INSERTION N/A 05/15/2019   Procedure: TANDEM RING INSERTION;  Surgeon: Antony Blackbird, MD;  Location: [facility]  Service: Urology;  Laterality: N/A;   TANDEM RING INSERTION N/A 05/22/2019   Procedure: TANDEM RING INSERTION;  Surgeon: Antony Blackbird, MD;  Location: [facility];  Service: Urology;  Laterality: N/A;   TANDEM RING INSERTION N/A 05/29/2019    Procedure: TANDEM RING INSERTION;  Surgeon: Antony Blackbird, MD;  Location: [facility];  Service: Urology;  Laterality: N/A;   TANDEM RING INSERTION N/A 06/05/2019   Procedure: TANDEM RING INSERTION;  Surgeon: Antony Blackbird, MD;  Location: [facility];  Service: Urology;  Laterality: N/A;   Patient Active Problem List   Diagnosis Date Noted   HSV-2 infection 07/13/2022   Dyspareunia in female 10/23/2021   Hyperlipidemia associated with type 2 diabetes mellitus (HCC) 07/03/2021   OSA (obstructive sleep apnea) 07/31/2020   Other hyperlipidemia 01/10/2020   Insulin resistance 01/10/2020   Class 2 severe obesity with serious comorbidity and body mass index (BMI) of 39.0 to 39.9 in adult [facility]) 01/10/2020   Peripheral 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) 04/25/2019   CKD (chronic kidney disease), symptom management only, stage 3 (moderate) (HCC) 03/30/2019   Generalized anxiety disorder 03/30/2019   Hypertension associated with type 2 diabetes mellitus (HCC)    Squamous cell carcinoma of cervix (HCC) 03/23/2019   Deficiency anemia 03/22/2019    ONSET DATE: 2 years  REFERRING DIAG: G62.9 (ICD-10-CM) - Neuropathy R26.89 (ICD-10-CM) - Balance problem  THERAPY DIAG:  Muscle weakness (generalized)  Unsteadiness on feet  Difficulty in walking, not elsewhere classified  Other abnormalities of gait and mobility  Rationale for Evaluation and Treatment: Rehabilitation  SUBJECTIVE:                                                                                                                                                                                             SUBJECTIVE STATEMENT: Still having numbing pain in RLE with prolonged standing/walking, does not happen with pool exercise. Relieved by seated rest  Pt accompanied by: self  PERTINENT HISTORY: 47 y.o. female with a hx of hypertension, preeclampsia, hyperlipidemia, OSA, diabetes, squamous cell carcinoma  of  cervix s/p radiation and chemotherapy, morbid obesity. For exercise she was previously in aquatic therapy, now participates in Silver sneakers. She states that every 6 months she has recurring issues with capsulitis/paronychia in her feet which has affected her physical activity. She follows with her podiatrist  PAIN:  Are you having pain?  No pain, right hip tightness from driving long distance  PRECAUTIONS: None  RED FLAGS: None   WEIGHT BEARING RESTRICTIONS: No  FALLS: Has patient fallen in last 6 months? No  LIVING ENVIRONMENT: Lives with: lives with their partner Lives in: House/apartment Stairs:  split foyer, 6 steps up/down, bilat HR Has following equipment at home: None  PLOF: Independent  PATIENT GOALS:   OBJECTIVE:   TODAY'S TREATMENT: 01/07/23 Activity Comments  POC STG/LTG review   : 825 ft Vitals pre-test: 137/81 mmHg, 81 bpm, 97% Vitals post-test: 131/90, 111 bpm, 97%  HEP review               Todays Treatment: 12/29/22  Patient seen for aquatic therapy today.  Treatment took place in water 3.6-4.0 feet deep depending upon activity.  Pt entered and exited the pool via stairs using B rails as needed for support.  Pool temp approx 92 deg.    Warm Up:  Walking approx 18' forwards x 4 laps, backwards x 4 laps and side stepping with small barbells abd when feet abd, squat then adducting LEs/UEs x 2 laps.    Forward marching x 2 laps holding smaller barbells with cues for slower motion to emphasize balance challenge>another 2 laps adding in opposite UE movements.  Again, cues for slower movements.  Standing holding small barbells for support performing hip flex/ext (with knee ext) x 10 reps on each side.  While performing on RLE (standing on LLE) notice task is easier, so had her perform without barbells.  She did attempt when in stance on RLE however was too difficult so gave barbells back.    Ai Chi postures for core strengthening and balance:   "Enclosing" x 10 reps, "Accepting" x 10 reps each, and "Balancing" x 10 reps each. Again note some difficulty with stance on RLE so did modify this and have her hold wall with one UE while performing.    Discussed numbness/tightness in R leg during session.  PT demo'd standing R ITB stretch which she was able to return demo however she only reports mild stretch.  Ambulated over to pool bench with feet supported on step and had her perform seated figure 4 stretch which did elicit stretch in same area per pt report.  She reports she may have this one for HEP but can't remember.  Also had her perform R quad stretch in standing with R foot on pool bench with tactile and verbal cues for upright posture and decreased trunk rotation away from R thigh.  She reports this also feels very tight so verbalized how she could perform this safely at home.  Pt verbalize understanding.    Pt requires buoyancy of water for support for reduced fall risk and for unloading/reduced stress on joints as pt able to tolerate increased standing and ambulation in water compared to that on land; viscosity of water is needed for resistance for strengthening and current of water provides perturbations for challenge for balance training       PATIENT EDUCATION: Education details: Goal of developing aquatic HEP  Person educated: Patient Education method: Explanation Education comprehension: verbalized understanding  HOME EXERCISE PROGRAM: Access Code: [email or web address] URL: [email or web address]  Date: 11/26/2022 Prepared by: [facility] - Outpatient  Rehab - [facility]  Exercises - Seated Ankle Dorsiflexion with Resistance  - 1 x daily - 7 x weekly - 3 sets - 10 reps - Standing Balance in Corner  - 1 x daily - 7 x weekly - 1 sets - 5 reps - Standing Balance in Corner with Eyes Closed  - 1 x daily - 7 x weekly - 1 sets - 5 reps - Sidelying Hip Abduction  - 1 x daily - 5 x weekly - 3 sets - 10 reps - Supine Bridge  with Resistance Band  - 1 x daily - 7 x weekly - 3 sets - 10 reps - Hooklying Clamshell with Resistance  - 1 x daily - 7 x weekly - 3 sets - 10 reps - Hooklying Single Knee to Chest Stretch  - 1 x daily - 7 x weekly - 1 sets - 5 reps - 15 sec hold - Tandem Stance in Corner  - 1 x daily - 7 x weekly - 3 sets - 30 sec hold - Standing Gastroc Stretch  - 1 x daily - 7 x weekly - 3 sets - 30-60 sec hold  ---------------------------------------------------------------------------- Objective measures below taken at initial evaluation:  DIAGNOSTIC FINDINGS: N/A for episode  COGNITION: Overall cognitive status:  WFL, pt reports some memory deficits since chemotherapy   SENSATION: Light touch: Impaired  dorsum and plantar surfaces bilateral feet  COORDINATION: WFL  EDEMA:  Some swelling in bilat feet appreciated  MUSCLE TONE: WNL     POSTURE: No Significant postural limitations  LOWER EXTREMITY ROM:     Active  Right Eval Left Eval  Hip flexion    Hip extension    Hip abduction    Hip adduction    Hip internal rotation    Hip external rotation    Knee flexion 115 115  Knee extension 0 0  Ankle dorsiflexion 15 15  Ankle plantarflexion 30 30  Ankle inversion    Ankle eversion     (Blank rows = not tested)  LOWER EXTREMITY MMT:    MMT Right Eval Left Eval  Hip flexion 5 5  Hip extension    Hip abduction    Hip adduction    Hip internal rotation    Hip external rotation    Knee flexion 5 5  Knee extension 5 5  Ankle dorsiflexion 3 4  Ankle plantarflexion 4 4  Ankle inversion    Ankle eversion    (Blank rows = not tested)  BED MOBILITY:  indep  TRANSFERS: Assistive device utilized: None  Sit to stand: Complete Independence Stand to sit: Complete Independence Chair to chair: Complete Independence Floor:  NT    CURB:  Level of Assistance: Modified independence Assistive device utilized: None Curb Comments: increased time to set body  position  STAIRS: Level of Assistance: Modified independence Stair Negotiation Technique: Step to Pattern Alternating Pattern  with Bilateral Rails Number of Stairs: 12  Height of Stairs: 4-6  Comments:   GAIT: Gait pattern: decreased stride length and antalgic Distance walked:  Assistive device utilized: None Level of assistance: Complete Independence Comments:   FUNCTIONAL TESTS:  5 times sit to stand: 14.53 sec Timed up and go (TUG): NT Berg Balance Scale: 49/56  M-CTSIB  Condition 1: Firm Surface, EO 30 Sec, Normal Sway  Condition 2: Firm Surface, EC 30 Sec, Mild and Moderate Sway  Condition 3: Foam Surface, EO 30 Sec, Mild Sway  Condition 4: Foam Surface, EC 30 Sec, Mild and Moderate Sway         GOALS: Goals reviewed with patient? Yes  SHORT TERM GOALS: Target date: 01/07/2023     Patient will be independent in HEP to improve functional outcomes Baseline: Goal status: MET, 11/26/2022  2.  Demo improved balance and reduce risk for falls per score 52/56 Berg Balance Test Baseline: 49/56; (12/10/22) 55/56 Goal status: MET  3.  Demo improved ambulation tolerance as evidenced by distance of 1,500 ft during  Baseline: initial 630 ft 11/05/2022; (01/07/23) 825 ft  Goal status:NOT MET  4.  Demo improved postural stability for improved safety with ADL as evidenced by normal-mild sway x 30 sec condition 4 M-CTSIB.  Baseline: moderate; mild x 30  Goal status: MET  LONG TERM GOALS: Target date: 02/04/2023      Patient to be independent with advanced HEP to include land/aquatic activities as indicated Baseline:  Goal status: IN PROGRESS  2.  Demo 4+/5 bilateral ankle dorsiflexion and plantarflexion strength for improved stability Baseline: (12/10/22) 3+/5 right dorsiflexion, 5/5 plantarflexion; (01/07/23) 4-/5 dorsiflexion; 5/5 plantarflexion Goal status: PARTIALLY MET  3.  Patient to report absent right thigh numbness for improved comfort Baseline: daily,  with prolonged standing/walking-relieved by seated rest period Goal status: NOT MET    ASSESSMENT:  CLINICAL IMPRESSION: Pt notes ongoing issue of sensory disturbance to right lateral thigh with prolonged standing/walking which is relieved by seated rest.  Review of POC details with some improvement appreciated to right ankle dorsiflexion and demonstrates mastery of land-based HEP activities for improved strength and balance.  reveals improved ambulation tolerance as evidenced by increased distance from initial 630 ft distance to 825 ft distance today with stable vital signs exhibited.  Patient will have one additional f/u visit for aquatic therapy for HEP review/refinement and will D/C to home and community programs.  OBJECTIVE IMPAIRMENTS: Abnormal gait, decreased activity tolerance, decreased balance, difficulty walking, decreased strength, and impaired sensation.   ACTIVITY LIMITATIONS: standing, stairs, and locomotion level  PARTICIPATION LIMITATIONS: cleaning and community activity  PERSONAL FACTORS: Time since onset of injury/illness/exacerbation and 1-2 comorbidities: PMH  are also affecting patient's functional outcome.   REHAB POTENTIAL: Good  CLINICAL DECISION MAKING: Evolving/moderate complexity  EVALUATION COMPLEXITY: Moderate  PLAN:  PT FREQUENCY: 1x/week  PT DURATION: 8 weeks  PLANNED INTERVENTIONS: Therapeutic exercises, Therapeutic activity, Neuromuscular re-education, Balance training, Gait training, Patient/Family education, Self Care, Joint mobilization, Stair training, Vestibular training, Canalith repositioning, DME instructions, Aquatic Therapy, Dry Needling, Electrical stimulation, Spinal mobilization, Cryotherapy, Moist heat, Taping, and Manual therapy  PLAN FOR NEXT SESSION: last aquatic session and D/C please  Aquatic HEP :  Have formal HEP ready as she may only have one more pool visit left.    12:50 PM, 01/07/23 M. Shary Decamp, PT, DPT Physical  Therapist- [place] Office Number: [phone]

## 2023-01-12 ENCOUNTER — Ambulatory Visit: Payer: Medicaid Other | Admitting: Rehabilitation

## 2023-01-12 ENCOUNTER — Encounter: Payer: Self-pay | Admitting: Rehabilitation

## 2023-01-12 DIAGNOSIS — R2689 Other abnormalities of gait and mobility: Secondary | ICD-10-CM

## 2023-01-12 DIAGNOSIS — R2681 Unsteadiness on feet: Secondary | ICD-10-CM

## 2023-01-12 DIAGNOSIS — M6281 Muscle weakness (generalized): Secondary | ICD-10-CM

## 2023-01-12 DIAGNOSIS — R262 Difficulty in walking, not elsewhere classified: Secondary | ICD-10-CM

## 2023-01-12 NOTE — Therapy (Signed)
OUTPATIENT PHYSICAL THERAPY NEURO TREATMENT and DC SUMMARY   Patient Name: Tamara Osborne MRN: [id number] DOB:04/07/1975, 47 y.o., female Today's Date: 01/12/2023   PCP: Alyson Reedy, FNP REFERRING PROVIDER: Chilton Si, MD      END OF SESSION:  PT End of Session - 01/12/23 0832     Visit Number 11    Number of Visits 14    Date for PT Re-Evaluation 02/04/23    Authorization Type [place] Medicaid [employer] (Approved 5 visits from 10/11-12/5)    Authorization - Visit Number 5    Authorization - Number of Visits 5    PT Start Time 1100    PT Stop Time 1145    PT Time Calculation (min) 45 min    Equipment Utilized During Treatment Other (comment)   floatation devices as needed for safety   Activity Tolerance Patient tolerated treatment well    Behavior During Therapy WFL for tasks assessed/performed                 Past Medical History:  Diagnosis Date   Anemia    Angio-edema    Anxiety    Bartholin cyst    Cervical cancer (HCC)    Chronic kidney disease    Constipation    Depression    Deviated septum    Diabetes mellitus without complication (HCC)    Difficult intravenous access    used port for 05-09-2019 surgery   Fibroids    History of blood transfusion    2 units given 04-26-2019, has had total of 8 units since jan 2021   History of blood transfusion 1 unit given 05-30-19   iv fluids also given   History of cervical cancer 07/16/2022   History of kidney problems    History of radiation therapy 03/23/2019-05/04/2019   Cervical external beam   Dr Antony Blackbird   History of radiation therapy 05/09/2019-06/05/2019   vaginal brachytherapy   Dr Antony Blackbird   History of recent blood transfusion 05/16/2019   2 units given  per dr Lambert Mody at cancer center   Hypertension    Neuropathy    both thumbs   Obesity    Palpitations    PONV (postoperative nausea and vomiting)    Prediabetes    Preeclampsia 2006   Sleep apnea    no cpap used insurance  would not cover, osa severe per pt   SOB (shortness of breath)    Squamous cell carcinoma of cervix (HCC) 03/23/2019   Swallowing difficulty    Symptomatic skin lesion 07/14/2022   Vitamin D deficiency 01/10/2020   Past Surgical History:  Procedure Laterality Date   CESAREAN SECTION WITH BILATERAL TUBAL LIGATION  11/10/2004       DILATION AND CURETTAGE OF UTERUS  1997   IR IMAGING GUIDED PORT INSERTION  04/04/2019   IR REMOVAL TUN ACCESS W/ PORT W/O FL MOD SED  10/27/2021   OPERATIVE ULTRASOUND N/A 05/09/2019   Procedure: OPERATIVE ULTRASOUND;  Surgeon: Antony Blackbird, MD;  Location: [facility];  Service: Urology;  Laterality: N/A;   OPERATIVE ULTRASOUND N/A 05/15/2019   Procedure: OPERATIVE ULTRASOUND;  Surgeon: Antony Blackbird, MD;  Location: [facility];  Service: Urology;  Laterality: N/A;   OPERATIVE ULTRASOUND N/A 05/22/2019   Procedure: OPERATIVE ULTRASOUND;  Surgeon: Antony Blackbird, MD;  Location: [facility];  Service: Urology;  Laterality: N/A;   OPERATIVE ULTRASOUND N/A 05/29/2019   Procedure: OPERATIVE ULTRASOUND;  Surgeon: Antony Blackbird, MD;  Location: [facility];  Service: Urology;  Laterality: N/A;   OPERATIVE ULTRASOUND N/A 06/05/2019   Procedure: OPERATIVE ULTRASOUND;  Surgeon: Antony Blackbird, MD;  Location: [facility];  Service: Urology;  Laterality: N/A;   TANDEM RING INSERTION N/A 05/09/2019   Procedure: TANDEM RING INSERTION;  Surgeon: Antony Blackbird, MD;  Location: [facility];  Service: Urology;  Laterality: N/A;   TANDEM RING INSERTION N/A 05/15/2019   Procedure: TANDEM RING INSERTION;  Surgeon: Antony Blackbird, MD;  Location: [facility];  Service: Urology;  Laterality: N/A;   TANDEM RING INSERTION N/A 05/22/2019   Procedure: TANDEM RING INSERTION;  Surgeon: Antony Blackbird, MD;  Location: [facility];  Service: Urology;  Laterality: N/A;   TANDEM RING INSERTION  N/A 05/29/2019   Procedure: TANDEM RING INSERTION;  Surgeon: Antony Blackbird, MD;  Location: [facility];  Service: Urology;  Laterality: N/A;   TANDEM RING INSERTION N/A 06/05/2019   Procedure: TANDEM RING INSERTION;  Surgeon: Antony Blackbird, MD;  Location: [facility];  Service: Urology;  Laterality: N/A;   Patient Active Problem List   Diagnosis Date Noted   HSV-2 infection 07/13/2022   Dyspareunia in female 10/23/2021   Hyperlipidemia associated with type 2 diabetes mellitus (HCC) 07/03/2021   OSA (obstructive sleep apnea) 07/31/2020   Other hyperlipidemia 01/10/2020   Insulin resistance 01/10/2020   Class 2 severe obesity with serious comorbidity and body mass index (BMI) of 39.0 to 39.9 in adult [facility]) 01/10/2020   Peripheral 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) 04/25/2019   CKD (chronic kidney disease), symptom management only, stage 3 (moderate) (HCC) 03/30/2019   Generalized anxiety disorder 03/30/2019   Hypertension associated with type 2 diabetes mellitus (HCC)    Squamous cell carcinoma of cervix (HCC) 03/23/2019   Deficiency anemia 03/22/2019    ONSET DATE: 2 years  REFERRING DIAG: G62.9 (ICD-10-CM) - Neuropathy R26.89 (ICD-10-CM) - Balance problem  THERAPY DIAG:  Muscle weakness (generalized)  Unsteadiness on feet  Difficulty in walking, not elsewhere classified  Other abnormalities of gait and mobility  Rationale for Evaluation and Treatment: Rehabilitation  SUBJECTIVE:                                                                                                                                                                                             SUBJECTIVE STATEMENT: Pt reports doing well, that this is last visit but is planning to go to [facility] pool to continue exercises.      Pt accompanied by: self  PERTINENT HISTORY: 47 y.o. female with a hx of hypertension, preeclampsia,  hyperlipidemia, OSA, diabetes, squamous cell carcinoma  of cervix s/p radiation and chemotherapy, morbid obesity. For exercise she was previously in aquatic therapy, now participates in Silver sneakers. She states that every 6 months she has recurring issues with capsulitis/paronychia in her feet which has affected her physical activity. She follows with her podiatrist  PAIN:  Are you having pain?  No pain, right hip tightness from driving long distance  PRECAUTIONS: None  RED FLAGS: None   WEIGHT BEARING RESTRICTIONS: No  FALLS: Has patient fallen in last 6 months? No  LIVING ENVIRONMENT: Lives with: lives with their partner Lives in: House/apartment Stairs:  split foyer, 6 steps up/down, bilat HR Has following equipment at home: None  PLOF: Independent  PATIENT GOALS:   OBJECTIVE:   Todays Treatment: 12/29/22  Patient seen for aquatic therapy today.  Treatment took place in water 3.6-4.0 feet deep depending upon activity.  Pt entered and exited the pool via stairs using B rails as needed for support.  Pool temp approx 92 deg.    Warm Up:  Walking approx 18' forwards x 4 laps, backwards x 4 laps and side stepping with small barbells abd when feet abd, squat then adducting LEs/UEs x 4 laps.  (This is on HEP below)  Provided HEP during session and reviewed all exercises.   Access Code: [email or web address] URL: [email or web address] Date: 01/12/2023 Prepared by: Harriet Butte  Exercises - Forward and Backward Stepping at [facility]  - 1 x daily - 7 x weekly - 1 sets - 4 reps - Side Stepping with Hand Floats  - 1 x daily - 7 x weekly - 1 sets - 4 reps - Forward March with Opposite Arm Knee Taps and Hand Floats  - 1 x daily - 7 x weekly - 1 sets - 2 reps - Standing Hip Flexion Extension at [employer]  - 1 x daily - 7 x weekly - 1 sets - 10 reps - Standing Hip Circles at [employer]  - 1 x daily - 7 x weekly - 1 sets - 10 reps - Standing Hip Abduction Adduction at [employer]  - 1 x daily - 7 x weekly - 1 sets - 10 reps  Also  performed and added Ai chi postures: "Accepting", "Rounding" and "Balancing" all x 10 reps which she is able to do without support just cuing for technique and moving slow to focus on balance.       Pt requires buoyancy of water for support for reduced fall risk and for unloading/reduced stress on joints as pt able to tolerate increased standing and ambulation in water compared to that on land; viscosity of water is needed for resistance for strengthening and current of water provides perturbations for challenge for balance training       PATIENT EDUCATION: Education details: Goal of developing aquatic HEP  Person educated: Patient Education method: Explanation Education comprehension: verbalized understanding  HOME EXERCISE PROGRAM: Access Code: [email or web address] URL: [email or web address] Date: 11/26/2022 Prepared by: [facility] - Outpatient  Rehab - [facility]  Exercises - Seated Ankle Dorsiflexion with Resistance  - 1 x daily - 7 x weekly - 3 sets - 10 reps - Standing Balance in Corner  - 1 x daily - 7 x weekly - 1 sets - 5 reps - Standing Balance in Corner with Eyes Closed  - 1 x daily - 7 x weekly - 1 sets - 5 reps - Sidelying Hip Abduction  - 1 x daily - 5 x weekly -  3 sets - 10 reps - Supine Bridge with Resistance Band  - 1 x daily - 7 x weekly - 3 sets - 10 reps - Hooklying Clamshell with Resistance  - 1 x daily - 7 x weekly - 3 sets - 10 reps - Hooklying Single Knee to Chest Stretch  - 1 x daily - 7 x weekly - 1 sets - 5 reps - 15 sec hold - Tandem Stance in Corner  - 1 x daily - 7 x weekly - 3 sets - 30 sec hold - Standing Gastroc Stretch  - 1 x daily - 7 x weekly - 3 sets - 30-60 sec hold  ---------------------------------------------------------------------------- Objective measures below taken at initial evaluation:  DIAGNOSTIC FINDINGS: N/A for episode  COGNITION: Overall cognitive status:  WFL, pt reports some memory deficits since  chemotherapy   SENSATION: Light touch: Impaired  dorsum and plantar surfaces bilateral feet  COORDINATION: WFL  EDEMA:  Some swelling in bilat feet appreciated  MUSCLE TONE: WNL     POSTURE: No Significant postural limitations  LOWER EXTREMITY ROM:     Active  Right Eval Left Eval  Hip flexion    Hip extension    Hip abduction    Hip adduction    Hip internal rotation    Hip external rotation    Knee flexion 115 115  Knee extension 0 0  Ankle dorsiflexion 15 15  Ankle plantarflexion 30 30  Ankle inversion    Ankle eversion     (Blank rows = not tested)  LOWER EXTREMITY MMT:    MMT Right Eval Left Eval  Hip flexion 5 5  Hip extension    Hip abduction    Hip adduction    Hip internal rotation    Hip external rotation    Knee flexion 5 5  Knee extension 5 5  Ankle dorsiflexion 3 4  Ankle plantarflexion 4 4  Ankle inversion    Ankle eversion    (Blank rows = not tested)  BED MOBILITY:  indep  TRANSFERS: Assistive device utilized: None  Sit to stand: Complete Independence Stand to sit: Complete Independence Chair to chair: Complete Independence Floor:  NT    CURB:  Level of Assistance: Modified independence Assistive device utilized: None Curb Comments: increased time to set body position  STAIRS: Level of Assistance: Modified independence Stair Negotiation Technique: Step to Pattern Alternating Pattern  with Bilateral Rails Number of Stairs: 12  Height of Stairs: 4-6  Comments:   GAIT: Gait pattern: decreased stride length and antalgic Distance walked:  Assistive device utilized: None Level of assistance: Complete Independence Comments:   FUNCTIONAL TESTS:  5 times sit to stand: 14.53 sec Timed up and go (TUG): NT Berg Balance Scale: 49/56  M-CTSIB  Condition 1: Firm Surface, EO 30 Sec, Normal Sway  Condition 2: Firm Surface, EC 30 Sec, Mild and Moderate Sway  Condition 3: Foam Surface, EO 30 Sec, Mild Sway  Condition 4:  Foam Surface, EC 30 Sec, Mild and Moderate Sway         GOALS: Goals reviewed with patient? Yes  SHORT TERM GOALS: Target date: 01/07/2023     Patient will be independent in HEP to improve functional outcomes Baseline: Goal status: MET, 11/26/2022  2.  Demo improved balance and reduce risk for falls per score 52/56 Berg Balance Test Baseline: 49/56; (12/10/22) 55/56 Goal status: MET  3.  Demo improved ambulation tolerance as evidenced by distance of 1,500 ft during  Baseline:  initial 630 ft 11/05/2022  Goal status:IN PROGRESS  4.  Demo improved postural stability for improved safety with ADL as evidenced by normal-mild sway x 30 sec condition 4 M-CTSIB.  Baseline: moderate  Goal status: IN PROGRESS  LONG TERM GOALS: Target date: 02/04/2023      Patient to be independent with advanced HEP to include land/aquatic activities as indicated Baseline:  Goal status: MET   2.  Demo 4+/5 bilateral ankle dorsiflexion and plantarflexion strength for improved stability Baseline: (12/10/22) 3+/5 right dorsiflexion, 5/5 plantarflexion Goal status: PARTIALLY MET  3.  Patient to report absent right thigh numbness for improved comfort Baseline: daily, with prolonged standing Goal status: IN PROGRESS  PHYSICAL THERAPY DISCHARGE SUMMARY  Visits from Start of Care: 11  Current functional level related to goals / functional outcomes: See goals above   Remaining deficits: Continues to have stiffness/numbness in R hip, but overall balance and endurance has improved.    Education / Equipment: Land and aquatic HEP    Patient agrees to discharge. Patient goals were partially met. Patient is being discharged due to meeting the stated rehab goals.   ASSESSMENT:  CLINICAL IMPRESSION: Pt able to complete HEP today and note improved balance on RLE vs previous aquatic session.  Pt given laminated HEP so that she can take with her to [facility].  Pt ready for DC at this time.   OBJECTIVE  IMPAIRMENTS: Abnormal gait, decreased activity tolerance, decreased balance, difficulty walking, decreased strength, and impaired sensation.   ACTIVITY LIMITATIONS: standing, stairs, and locomotion level  PARTICIPATION LIMITATIONS: cleaning and community activity  PERSONAL FACTORS: Time since onset of injury/illness/exacerbation and 1-2 comorbidities: PMH  are also affecting patient's functional outcome.   REHAB POTENTIAL: Good  CLINICAL DECISION MAKING: Evolving/moderate complexity  EVALUATION COMPLEXITY: Moderate  PLAN:  PT FREQUENCY: 1x/week  PT DURATION: 8 weeks  PLANNED INTERVENTIONS: Therapeutic exercises, Therapeutic activity, Neuromuscular re-education, Balance training, Gait training, Patient/Family education, Self Care, Joint mobilization, Stair training, Vestibular training, Canalith repositioning, DME instructions, Aquatic Therapy, Dry Needling, Electrical stimulation, Spinal mobilization, Cryotherapy, Moist heat, Taping, and Manual therapy  PLAN FOR NEXT SESSION: Continued sessions to progress land  Aquatic HEP :  Have formal HEP ready as she may only have one more pool visit left.    Harriet Butte, PT, MPT [facility] [address] [place], [place], [address] Phone: [phone]   Fax:  [phone] 01/12/23, 11:53 AM

## 2023-01-21 ENCOUNTER — Encounter (HOSPITAL_BASED_OUTPATIENT_CLINIC_OR_DEPARTMENT_OTHER): Payer: Self-pay | Admitting: Family Medicine

## 2023-01-21 ENCOUNTER — Ambulatory Visit: Payer: Medicaid Other

## 2023-01-25 ENCOUNTER — Ambulatory Visit: Payer: Medicaid Other | Admitting: Physical Therapy

## 2023-01-27 ENCOUNTER — Encounter (INDEPENDENT_AMBULATORY_CARE_PROVIDER_SITE_OTHER): Payer: Self-pay | Admitting: Adult Health

## 2023-01-27 ENCOUNTER — Ambulatory Visit (INDEPENDENT_AMBULATORY_CARE_PROVIDER_SITE_OTHER): Payer: Medicaid Other | Admitting: Adult Health

## 2023-01-27 VITALS — BP 118/72 | HR 86 | Temp 97.5°F | Ht 61.0 in | Wt 228.0 lb

## 2023-01-27 DIAGNOSIS — E1122 Type 2 diabetes mellitus with diabetic chronic kidney disease: Secondary | ICD-10-CM

## 2023-01-27 DIAGNOSIS — E669 Obesity, unspecified: Secondary | ICD-10-CM

## 2023-01-27 DIAGNOSIS — E1159 Type 2 diabetes mellitus with other circulatory complications: Secondary | ICD-10-CM

## 2023-01-27 DIAGNOSIS — Z7985 Long-term (current) use of injectable non-insulin antidiabetic drugs: Secondary | ICD-10-CM | POA: Diagnosis not present

## 2023-01-27 DIAGNOSIS — E559 Vitamin D deficiency, unspecified: Secondary | ICD-10-CM

## 2023-01-27 DIAGNOSIS — I152 Hypertension secondary to endocrine disorders: Secondary | ICD-10-CM

## 2023-01-27 DIAGNOSIS — N1832 Chronic kidney disease, stage 3b: Secondary | ICD-10-CM

## 2023-01-27 DIAGNOSIS — N183 Chronic kidney disease, stage 3 unspecified: Secondary | ICD-10-CM

## 2023-01-27 DIAGNOSIS — Z6841 Body Mass Index (BMI) 40.0 and over, adult: Secondary | ICD-10-CM

## 2023-01-27 MED ORDER — SEMAGLUTIDE (1 MG/DOSE) 4 MG/3ML ~~LOC~~ SOPN: 1.0000 mg | PEN_INJECTOR | SUBCUTANEOUS | 0 refills | Status: DC

## 2023-01-27 NOTE — Progress Notes (Signed)
WEIGHT SUMMARY AND BIOMETRICS  Vitals Temp: (!) 97.5 F (36.4 C) BP: 118/72 Pulse Rate: 86 SpO2: 96 %   Anthropometric Measurements Height: 5\' 1"  (1.549 m) Weight: 228 lb (103.4 kg) BMI (Calculated): 43.1 Weight at Last Visit: 228 lb Weight Lost Since Last Visit: 0 lb Weight Gained Since Last Visit: 0 lb Starting Weight: 208 lb Total Weight Loss (lbs): 0 lb (0 kg)   Body Composition  Body Fat %: 47.6 % Fat Mass (lbs): 108.8 lbs Muscle Mass (lbs): 113.8 lbs Total Body Water (lbs): 82.2 lbs Visceral Fat Rating : 15   Other Clinical Data Fasting: No Labs: No Today's Visit #: 70 Starting Date: 09/20/19    Chief Complaint:   OBESITY Tamara Osborne is here to discuss her progress with her obesity treatment plan. She is on the keeping a food journal and adhering to recommended goals of 1450-1550 calories and 95+ protein and states she is following her eating plan approximately 90 % of the time. She states she is exercising Out Patient Physical Therapy 45 minutes 1 times per week.   Interim History:  The last 3 weeks- she endorses improvement in daily BP and increased energy levels!  Hunger/appetite-she has tolerated increase in GLP-1 therapy.  She endorses stable appetite.  Sleep- she estimates to sleep >9 hours/night- she reports 100% compliance with CPAP!  Exercise-she recently finished course of Out Patient PT- will restart Jan 2025 (insurance will not cover any more sessions this calendar year)  Hydration-she estimates to drink 1- 1.5 L water/day  Subjective:   1. CKD (chronic kidney disease), symptom management only, stage 3 (moderate) (HCC)  Latest Reference Range & Units 10/23/21 12:18 08/06/22 11:58 12/09/22 12:03  Creatinine 0.5 - 1.1  1.21 (H) 1.50 (H) 1.6 ! (E)    Latest Reference Range & Units 08/06/22 11:58 12/09/22 12:03 12/10/22 14:35  eGFR >59 mL/min/1.73 43 (L) 40 (E) 40.0 (E)  (L): Data is abnormally low (E): External lab result  She is on  daily amLODipine-valsartan (EXFORGE) 10-320 MG tablet She is followed by Nephrology Q3M  2. Hypertension associated with type 2 diabetes mellitus (HCC) BP at goal at OV Home readings SBP 110-120  DBP 60-70 She is on EPINEPHrine (EPIPEN 2-PAK) 0.3 mg/0.3 mL IJ SOAJ injection  chlorthalidone (HYGROTON) 25 MG tablet  amLODipine-valsartan (EXFORGE) 10-320 MG tablet  Semaglutide, 1 MG/DOSE, 4 MG/3ML SOPN    She will HALF EXFORGE dose if DBP in low 60s Cards/Dr. Duke Salvia manages antihypertensive therapy  3. Vitamin D deficiency  Latest Reference Range & Units 06/05/21 11:32  Vitamin D, 25-Hydroxy 30 - 100 ng/mL 66.26   She is on daily OTC Vit D 3 2,000 international units   4. Type 2 diabetes mellitus with stage 3b chronic kidney disease, without long-term current use of insulin (HCC) At last OV on 11/50/2024 Ozempic increased from 0.75mg  to 1mg  Denies mass in neck, dysphagia, dyspepsia, persistent hoarseness, abdominal pain, or N/V/C  She has NOT been Fasting CBG  She denies sx's of hypoglycemia  Assessment/Plan:   1. CKD (chronic kidney disease), symptom management only, stage 3 (moderate) (HCC) Continue to avoid Nephrotoxic substances Continue with Nephrologist Q3M  2. Hypertension associated with type 2 diabetes mellitus (HCC) Check Labs at next OV Monitor closely for sx's of hypotension Track home BP readings- bring log to [facility] and OV with Cards/Dr. Duke Salvia  3. Vitamin D deficiency Check Labs at next OV  4. Type 2 diabetes mellitus with stage 3b chronic kidney disease,  without long-term current use of insulin (HCC) Refill - Semaglutide, 1 MG/DOSE, 4 MG/3ML SOPN; Inject 1 mg as directed once a week.  Dispense: 3 mL; Refill: 0  5. BMI 40.0-44.9, adult (HCC), Current BMI 43.2  Tamara Osborne is currently in the action stage of change. As such, her goal is to continue with weight loss efforts. She has agreed to keeping a food journal and adhering to recommended goals of  1450-1550 calories and 95 protein.   Exercise goals: All adults should avoid inactivity. Some physical activity is better than none, and adults who participate in any amount of physical activity gain some health benefits. Adults should also include muscle-strengthening activities that involve all major muscle groups on 2 or more days a week.  Behavioral modification strategies: increasing lean protein intake, decreasing simple carbohydrates, increasing vegetables, increasing water intake, decreasing eating out, no skipping meals, meal planning and cooking strategies, keeping healthy foods in the home, ways to avoid boredom eating, holiday eating strategies , planning for success, and keeping a strict food journal.  Tamara Osborne has agreed to follow-up with our clinic in 4 weeks. She was informed of the importance of frequent follow-up visits to maximize her success with intensive lifestyle modifications for her multiple health conditions.   Check Fasting Labs at next OV  Objective:   Blood pressure 118/72, pulse 86, temperature (!) 97.5 F (36.4 C), height 5\' 1"  (1.549 m), weight 228 lb (103.4 kg), SpO2 96%. Body mass index is 43.08 kg/m.  General: Cooperative, alert, well developed, in no acute distress. HEENT: Conjunctivae and lids unremarkable. Cardiovascular: Regular rhythm.  Lungs: Normal work of breathing. Neurologic: No focal deficits.   Lab Results  Component Value Date   CREATININE 1.6 (A) 12/09/2022   BUN 32 (A) 12/09/2022   NA 140 12/09/2022   K 4.3 12/09/2022   CL 103 12/09/2022   CO2 20 08/06/2022   Lab Results  Component Value Date   ALT 48 (H) 08/06/2022   AST 27 08/06/2022   ALKPHOS 97 08/06/2022   BILITOT 0.3 08/06/2022   Lab Results  Component Value Date   HGBA1C 5.9 (H) 08/06/2022   HGBA1C 5.3 07/16/2022   HGBA1C 5.4 06/05/2021   HGBA1C 5.2 01/13/2021   HGBA1C 7.5 (H) 10/18/2020   Lab Results  Component Value Date   INSULIN 40.3 (H) 09/20/2019   Lab  Results  Component Value Date   TSH 1.850 08/06/2022   Lab Results  Component Value Date   CHOL 209 (H) 08/06/2022   HDL 41 08/06/2022   LDLCALC 138 (H) 08/06/2022   TRIG 168 (H) 08/06/2022   CHOLHDL 5.1 (H) 08/06/2022   Lab Results  Component Value Date   VD25OH 66.26 06/05/2021   VD25OH 59.37 10/18/2020   VD25OH 45.60 05/20/2020   Lab Results  Component Value Date   WBC 9.3 08/06/2022   HGB 15.3 08/06/2022   HCT 45.3 08/06/2022   MCV 83 08/06/2022   PLT 326 08/06/2022   Lab Results  Component Value Date   IRON 17 (L) 03/27/2019   TIBC 283 03/27/2019   FERRITIN 32 03/27/2019    Attestation Statements:   Reviewed by clinician on day of visit: allergies, medications, problem list, medical history, surgical history, family history, social history, and previous encounter notes.  I have reviewed the above documentation for accuracy and completeness, and I agree with the above. -  Zainah Steven d. Bluma Buresh, NP-C

## 2023-02-01 ENCOUNTER — Telehealth: Payer: Self-pay | Admitting: Cardiovascular Disease

## 2023-02-01 NOTE — Telephone Encounter (Signed)
Spoke with patient regarding blood pressure readings She is in study and has been getting some error readings on her machine  Logged into vivify and blood pressure this am 117/79, yesterday 103/61 on 117/73 am  Has had a few blood pressures with SBP less than 100 When her SBP is on lower side/less than 100 has no energy and feel like she is unable to do anything without needing to sit down.   Will forward to Lily Kocher NP and Dr Duke Salvia for review   Has appointment next week with Dr Duke Salvia

## 2023-02-01 NOTE — Telephone Encounter (Signed)
Spoke with patient, has done well, but gets some pressure readings around 100 systolic.  Feels drained/tired when goes that low.    Will have her decrease chlorthalidone to 12.5 mg (1/2 tablet) for now.  Patient voiced understanding

## 2023-02-01 NOTE — Telephone Encounter (Signed)
Pt c/o BP issue: STAT if pt c/o blurred vision, one-sided weakness or slurred speech  1. What are your last 5 BP readings? This morning 101/64 This weekend 92/58 103/58-59  2. Are you having any other symptoms (ex. Dizziness, headache, blurred vision, passed out)? Felt lightheaded if moving   3. What is your BP issue? BP has been running low. Patient stated she was having issues with her BP cuff and she changed the batteries. Patient stated she would get an error message. Patient stated she didn't feel good this past weekend, just felt like her BP was low. Patient stated she did recently change her ozempic dosage, but was unsure if that had anything to do with it. Please advise.

## 2023-02-02 NOTE — Telephone Encounter (Signed)
February 02, 2023 Chilton Si, MD  to Me  Alver Sorrow, NP  Rosalee Kaufman, RPH-CPP     02/02/23  1:49 PM Agree.  Thanks.  TCR

## 2023-02-04 ENCOUNTER — Ambulatory Visit: Payer: Medicaid Other

## 2023-02-10 ENCOUNTER — Encounter (HOSPITAL_BASED_OUTPATIENT_CLINIC_OR_DEPARTMENT_OTHER): Payer: Self-pay | Admitting: Cardiovascular Disease

## 2023-02-10 ENCOUNTER — Ambulatory Visit (HOSPITAL_BASED_OUTPATIENT_CLINIC_OR_DEPARTMENT_OTHER): Payer: Medicaid Other | Admitting: Cardiovascular Disease

## 2023-02-10 ENCOUNTER — Telehealth (HOSPITAL_BASED_OUTPATIENT_CLINIC_OR_DEPARTMENT_OTHER): Payer: Self-pay | Admitting: *Deleted

## 2023-02-10 VITALS — BP 126/74 | HR 91 | Ht 62.0 in | Wt 232.4 lb

## 2023-02-10 DIAGNOSIS — I152 Hypertension secondary to endocrine disorders: Secondary | ICD-10-CM

## 2023-02-10 DIAGNOSIS — G4733 Obstructive sleep apnea (adult) (pediatric): Secondary | ICD-10-CM

## 2023-02-10 DIAGNOSIS — E1159 Type 2 diabetes mellitus with other circulatory complications: Secondary | ICD-10-CM | POA: Diagnosis not present

## 2023-02-10 DIAGNOSIS — E785 Hyperlipidemia, unspecified: Secondary | ICD-10-CM

## 2023-02-10 DIAGNOSIS — N183 Chronic kidney disease, stage 3 unspecified: Secondary | ICD-10-CM | POA: Diagnosis not present

## 2023-02-10 DIAGNOSIS — E7849 Other hyperlipidemia: Secondary | ICD-10-CM

## 2023-02-10 DIAGNOSIS — E1169 Type 2 diabetes mellitus with other specified complication: Secondary | ICD-10-CM

## 2023-02-10 NOTE — Patient Instructions (Addendum)
Medication Instructions:  Your physician recommends that you continue on your current medications as directed. Please refer to the Current Medication list given to you today.  Labwork: NONE  Testing/Procedures: NONE  Follow-Up: 6 MONTHS   Any Other Special Instructions Will Be Listed Below (If Applicable)  You can try Guaifenesin (Mucinex) for your chest congestion

## 2023-02-10 NOTE — Telephone Encounter (Signed)
Patient completed study and returned Vivify machine  

## 2023-02-10 NOTE — Progress Notes (Signed)
[facility] Initial Assessment:    Date:  02/10/2023   ID:  Tamara Osborne, DOB 04-Feb-1976, MRN [id number]  PCP:  Alyson Reedy, FNP  Cardiologist:  None  Nephrologist:  Referring MD: Alyson Reedy, FNP   CC: Hypertension  History of Present Illness:    Tamara Osborne is a 47 y.o. female with a hx of hypertension, preeclampsia, hyperlipidemia, OSA, diabetes, squamous cell carcinoma of cervix s/p radiation and chemotherapy, morbid obesity, here to establish care in [facility].  She will struggle to control her blood pressure.  She is referred by her PCP, Arby Barrette, FNP.  She was seen 08/06/2022 and blood pressure was 154/92 on amlodipine, valsartan, and chlorthalidone.  She was referred to [facility] and nephrology. She has been working with [facility]. Prior echo in 2021 revealed LVEF 60-65% with moderate LVH and grade 1 diastolic dysfunction.    Tamara Osborne struggled with hypertension since her 24s.  She had preeclampsia with pregnancy.  At her initial visit home blood pressures were averaging in Tamara 120s over 70s.  She had clonidine to take for emergencies.  She also did report whitecoat hypertension.  Blood pressure in [facility] office was 154/86.  Chlorthalidone was resumed.  Renal artery Dopplers were negative.  Her statin was discontinued due to myalgias.  She is with neuropathy from chemotherapy and was referred for water physical therapy.  She called [facility]ara office reporting low blood pressures 12/2. We recommended reducing chlorthalidone to 12.5mg .   Tamara Osborne presents with a persistent cough and chest congestion that began over Tamara weekend. Tamara cough is productive, with expectoration of yellowish phlegm. Tamara patient reports no associated pain, fever, or chills. Tamara cough is exacerbated by walking, causing shortness of breath, similar to an asthma attack. Tamara patient denies any history of  smoking.  She also reports a significant change in blood pressure control. Previously, she had high blood pressure readings, but recently, Tamara readings have been consistently low, even leading to symptoms of hypotension. She attributes this change to a different generic formulation of her antihypertensive medication.   She has been self-adjusting Tamara medication dosage based on her blood pressure readings and symptoms.   Tamara Osborne is also managing diabetes with an injectable medication, which has effectively controlled her blood glucose levels. However, Tamara patient has been struggling with weight loss, despite efforts to change dietary habits and increase physical activity.  She has been on a weight management program and has stopped taking Zoloft, suspecting it might be contributing to weight gain.  She also reports neuropathy in Tamara feet, which is being managed with physical therapy. Her goal is to lose weight and improve overall health, with a focus on maintaining controlled blood pressure and managing diabetes. She is actively seeking ways to increase physical activity and improve diet.    Previous antihypertensives: Multiple intolerances per her report Lisinopril - skin crawling sensations  Past Medical History:  Diagnosis Date   Anemia    Angio-edema    Anxiety    Bartholin cyst    Cervical cancer (HCC)    Chronic kidney disease    Constipation    Depression    Deviated septum    Diabetes mellitus without complication (HCC)    Difficult intravenous access    used port for 05-09-2019 surgery   Fibroids    History of blood transfusion    2 units given 04-26-2019, has had total of 8  units since jan 2021   History of blood transfusion 1 unit given 05-30-19   iv fluids also given   History of cervical cancer 07/16/2022   History of kidney problems    History of radiation therapy 03/23/2019-05/04/2019   Cervical external beam   Dr Antony Blackbird   History of radiation therapy  05/09/2019-06/05/2019   vaginal brachytherapy   Dr Antony Blackbird   History of recent blood transfusion 05/16/2019   2 units given  per dr Lambert Mody at cancer center   Hypertension    Neuropathy    both thumbs   Obesity    Palpitations    PONV (postoperative nausea and vomiting)    Prediabetes    Preeclampsia 2006   Sleep apnea    no cpap used insurance would not cover, osa severe per pt   SOB (shortness of breath)    Squamous cell carcinoma of cervix (HCC) 03/23/2019   Swallowing difficulty    Symptomatic skin lesion 07/14/2022   Vitamin D deficiency 01/10/2020    Past Surgical History:  Procedure Laterality Date   CESAREAN SECTION WITH BILATERAL TUBAL LIGATION  11/10/2004       DILATION AND CURETTAGE OF UTERUS  1997   IR IMAGING GUIDED PORT INSERTION  04/04/2019   IR REMOVAL TUN ACCESS W/ PORT W/O FL MOD SED  10/27/2021   OPERATIVE ULTRASOUND N/A 05/09/2019   Procedure: OPERATIVE ULTRASOUND;  Surgeon: Antony Blackbird, MD;  Location: [facility];  Service: Urology;  Laterality: N/A;   OPERATIVE ULTRASOUND N/A 05/15/2019   Procedure: OPERATIVE ULTRASOUND;  Surgeon: Antony Blackbird, MD;  Location: [facility];  Service: Urology;  Laterality: N/A;   OPERATIVE ULTRASOUND N/A 05/22/2019   Procedure: OPERATIVE ULTRASOUND;  Surgeon: Antony Blackbird, MD;  Location: [facility];  Service: Urology;  Laterality: N/A;   OPERATIVE ULTRASOUND N/A 05/29/2019   Procedure: OPERATIVE ULTRASOUND;  Surgeon: Antony Blackbird, MD;  Location: [facility];  Service: Urology;  Laterality: N/A;   OPERATIVE ULTRASOUND N/A 06/05/2019   Procedure: OPERATIVE ULTRASOUND;  Surgeon: Antony Blackbird, MD;  Location: [facility];  Service: Urology;  Laterality: N/A;   TANDEM RING INSERTION N/A 05/09/2019   Procedure: TANDEM RING INSERTION;  Surgeon: Antony Blackbird, MD;  Location: [facility];  Service: Urology;  Laterality: N/A;   TANDEM RING INSERTION  N/A 05/15/2019   Procedure: TANDEM RING INSERTION;  Surgeon: Antony Blackbird, MD;  Location: [facility];  Service: Urology;  Laterality: N/A;   TANDEM RING INSERTION N/A 05/22/2019   Procedure: TANDEM RING INSERTION;  Surgeon: Antony Blackbird, MD;  Location: [facility];  Service: Urology;  Laterality: N/A;   TANDEM RING INSERTION N/A 05/29/2019   Procedure: TANDEM RING INSERTION;  Surgeon: Antony Blackbird, MD;  Location: [facility];  Service: Urology;  Laterality: N/A;   TANDEM RING INSERTION N/A 06/05/2019   Procedure: TANDEM RING INSERTION;  Surgeon: Antony Blackbird, MD;  Location: [facility];  Service: Urology;  Laterality: N/A;    Current Medications: Current Meds  Medication Sig   Accu-Chek Softclix Lancets lancets Use to check blood sugar TID.   amLODipine-valsartan (EXFORGE) 10-320 MG tablet Take 1 tablet by mouth daily.   azelastine (ASTELIN) 0.1 % nasal spray Place 1 spray into both nostrils 2 (two) times daily as needed. Use in each nostril as directed   Blood Glucose Monitoring Suppl (ACCU-CHEK GUIDE) w/Device KIT Use to check blood sugar TID.  chlorthalidone (HYGROTON) 25 MG tablet Take 1 tablet (25 mg total) by mouth daily. (Patient taking differently: Take 12.5 mg by mouth daily.)   Cholecalciferol 50 MCG (2000 UT) TABS Take by mouth.   EPINEPHrine (EPIPEN 2-PAK) 0.3 mg/0.3 mL IJ SOAJ injection Inject 0.3 mg into Tamara muscle as needed for anaphylaxis.   fluticasone (FLONASE) 50 MCG/ACT nasal spray Place 2 sprays into both nostrils daily.   glucose blood (ACCU-CHEK GUIDE) test strip Use to check blood sugar TID.   ipratropium (ATROVENT) 0.06 % nasal spray Place 2 sprays into both nostrils 3 (three) times daily as needed for rhinitis (drainage).   levocetirizine (XYZAL) 5 MG tablet Take 1 tablet (5 mg total) by mouth every evening.   RESTASIS 0.05 % ophthalmic emulsion 1 drop 2 (two) times daily.   Semaglutide, 1 MG/DOSE, 4  MG/3ML SOPN Inject 1 mg as directed once a week.     Allergies:   Penicillins, Shellfish allergy, and Sulfa antibiotics   Social History   Socioeconomic History   Marital status: Single    Spouse name: Not on file   Number of children: 4   Years of education: Not on file   Highest education level: Not on file  Occupational History   Not on file  Tobacco Use   Smoking status: Never   Smokeless tobacco: Never  Vaping Use   Vaping status: Never Used  Substance and Sexual Activity   Alcohol use: No   Drug use: No   Sexual activity: Not Currently    Birth control/protection: Surgical  Other Topics Concern   Not on file  Social History Narrative   Lives with her boyfriend   Social Determinants of Health   Financial Resource Strain: Not on file  Food Insecurity: No Food Insecurity (10/08/2022)   Hunger Vital Sign    Worried About Running Out of Food in Tamara Last Year: Never true    Ran Out of Food in Tamara Last Year: Never true  Transportation Needs: No Transportation Needs (10/08/2022)   PRAPARE - Administrator, Civil Service (Medical): No    Lack of Transportation (Non-Medical): No  Physical Activity: Insufficiently Active (10/08/2022)   Exercise Vital Sign    Days of Exercise per Week: 2 days    Minutes of Exercise per Session: 40 min  Stress: Not on file  Social Connections: Not on file     Osborne History: Tamara Osborne history includes Anxiety disorder in her mother; Brain cancer in her mother; Cancer in her father, maternal grandmother, and mother; Depression in her mother; Diabetes in her father; Heart attack in her sister; Heart failure in her paternal aunt and sister; Hypertension in her mother; Kidney disease in her father; Kidney failure in her maternal grandfather.  ROS:   Please see Tamara history of present illness.    (+) RLE numbness All other systems reviewed and are negative.  EKGs/Labs/Other Studies Reviewed:    Echocardiogram   10/25/2019: Sonographer Comments: Per order - Did not administer Definity.   IMPRESSIONS   1. Left ventricular ejection fraction, by estimation, is 60 to 65%. Tamara  left ventricle has normal function. Tamara left ventricle has no regional  wall motion abnormalities. There is moderate left ventricular hypertrophy.  Left ventricular diastolic  parameters are consistent with Grade I diastolic dysfunction (impaired  relaxation).   2. Right ventricular systolic function is normal. Tamara right ventricular  size is normal.   3. Tamara mitral valve is normal in structure.  No evidence of mitral valve  regurgitation. No evidence of mitral stenosis.   4. Tamara aortic valve is normal in structure. Aortic valve regurgitation is  not visualized. No aortic stenosis is present.   5. Tamara inferior vena cava is normal in size with greater than 50%  respiratory variability, suggesting right atrial pressure of 3 mmHg.   EKG:  EKG is personally reviewed. 10/08/2022:  Sinus tachycardia. Rate 101 bpm.  LVH with secondary repolarization abnormality.   Recent Labs: 08/06/2022: ALT 48; Hemoglobin 15.3; Platelets 326; TSH 1.850 12/09/2022: BUN 32; Creatinine 1.6; Potassium 4.3; Sodium 140   Recent Lipid Panel    Component Value Date/Time   CHOL 209 (H) 08/06/2022 1158   TRIG 168 (H) 08/06/2022 1158   HDL 41 08/06/2022 1158   CHOLHDL 5.1 (H) 08/06/2022 1158   CHOLHDL 6.0 06/05/2021 1132   VLDL 26 06/05/2021 1132   LDLCALC 138 (H) 08/06/2022 1158    Physical Exam:    VS:  BP 126/74   Pulse 91   Ht 5\' 2"  (1.575 m)   Wt 232 lb 6.4 oz (105.4 kg)   SpO2 96%   BMI 42.51 kg/m  , BMI Body mass index is 42.51 kg/m. GENERAL:  Well appearing HEENT: Pupils equal round and reactive, fundi not visualized, oral mucosa unremarkable NECK:  No jugular venous distention, waveform within normal limits, carotid upstroke brisk and symmetric, no bruits, no thyromegaly LUNGS:  Clear to auscultation bilaterally HEART:  RRR.  PMI not  displaced or sustained,S1 and S2 within normal limits, no S3, no S4, no clicks, no rubs, no murmurs ABD:  Flat, positive bowel sounds normal in frequency in pitch, no bruits, no rebound, no guarding, no midline pulsatile mass, no hepatomegaly, no splenomegaly EXT:  2 plus pulses throughout, no edema, no cyanosis no clubbing SKIN:  No rashes no nodules NEURO:  Cranial nerves II through XII grossly intact, motor grossly intact throughout PSYCH:  Cognitively intact, oriented to person place and time   ASSESSMENT/PLAN:    # Acute Bronchitis New onset cough with yellowish sputum and shortness of breath on exertion. No fever, chills, or chest pain. Lungs clear on auscultation. -Take Mucinex to help break up phlegm.  # Hypertension Blood pressure well controlled on current regimen of amlodipine and valsartan. Recent episodes of low blood pressure with chlorthalidone, which has been discontinued. -Continue amlodipine and valsartan. -Discontinue chlorthalidone. -Check blood pressure regularly and report any episodes of hypotension.  # Obesity Patient is struggling with weight loss despite efforts with diet, exercise, and Weygovy  Recently stopped Zoloft due to concerns about weight gain. -Continue current weight loss efforts. -Consider referral to a dietitian or weight management program for additional support.  # Sleep Apnea Patient reports improvement in symptoms with use of sleep apnea machine. -Continue use of sleep apnea machine.  Follow-up in 6 months.   Screening for Secondary Hypertension:     10/08/2022   11:36 AM  Causes  Drugs/Herbals Screened     - Comments Limits sodium.  No EtOH.  Occasional coffee.  Renovascular HTN Screened     - Comments Check renal artery Dopplers  Sleep Apnea Screened     - Comments Uses CPAP  Thyroid Disease Screened  Hyperaldosteronism Screened     - Comments Check renin and aldosterone  Pheochromocytoma N/A  Cushing's Syndrome Screened      - Comments Check AM cortisol  Hyperparathyroidism Screened  Coarctation of Tamara Aorta Screened     - Comments BP  symmetric  Compliance Screened     - Comments out of chlorthalidone for a month    Relevant Labs/Studies:    Latest Ref Rng & Units 12/09/2022   12:03 PM 08/06/2022   11:58 AM 10/23/2021   12:18 PM  Basic Labs  Sodium 137 - 147 140     140  138   Potassium 3.5 - 5.1 mEq/L 4.3     4.9  4.0   Creatinine 0.5 - 1.1 1.6     1.50  1.21      This result is from an external source.       Latest Ref Rng & Units 08/06/2022   11:58 AM 09/20/2019   11:06 AM  Thyroid   TSH 0.450 - 4.500 uIU/mL 1.850  1.910       Medication Adjustments/Labs and Tests Ordered: Current medicines are reviewed at length with Tamara patient today.  Concerns regarding medicines are outlined above.   Orders Placed This Encounter  Procedures   Cantril's Ladder Assessment   No orders of Tamara defined types were placed in this encounter.    Signed, Chilton Si, MD  02/10/2023 5:35 PM    [facility]

## 2023-02-26 ENCOUNTER — Emergency Department (HOSPITAL_COMMUNITY)
Admission: EM | Admit: 2023-02-26 | Discharge: 2023-02-26 | Disposition: A | Discharge status: Home / Self Care | Payer: Medicaid Other | Attending: Emergency Medicine | Admitting: Emergency Medicine

## 2023-02-26 ENCOUNTER — Emergency Department (HOSPITAL_COMMUNITY): Payer: Medicaid Other

## 2023-02-26 ENCOUNTER — Encounter (HOSPITAL_COMMUNITY): Payer: Self-pay

## 2023-02-26 DIAGNOSIS — Z8541 Personal history of malignant neoplasm of cervix uteri: Secondary | ICD-10-CM | POA: Insufficient documentation

## 2023-02-26 DIAGNOSIS — I129 Hypertensive chronic kidney disease with stage 1 through stage 4 chronic kidney disease, or unspecified chronic kidney disease: Secondary | ICD-10-CM | POA: Diagnosis not present

## 2023-02-26 DIAGNOSIS — Z79899 Other long term (current) drug therapy: Secondary | ICD-10-CM | POA: Diagnosis not present

## 2023-02-26 DIAGNOSIS — N3091 Cystitis, unspecified with hematuria: Secondary | ICD-10-CM | POA: Diagnosis not present

## 2023-02-26 DIAGNOSIS — R109 Unspecified abdominal pain: Secondary | ICD-10-CM | POA: Diagnosis present

## 2023-02-26 DIAGNOSIS — E1122 Type 2 diabetes mellitus with diabetic chronic kidney disease: Secondary | ICD-10-CM | POA: Diagnosis not present

## 2023-02-26 DIAGNOSIS — N189 Chronic kidney disease, unspecified: Secondary | ICD-10-CM | POA: Insufficient documentation

## 2023-02-26 LAB — URINALYSIS, W/ REFLEX TO CULTURE (INFECTION SUSPECTED)
Bilirubin Urine: NEGATIVE
Glucose, UA: NEGATIVE mg/dL
Ketones, ur: NEGATIVE mg/dL
Nitrite: NEGATIVE
Protein, ur: 30 mg/dL — AB
Specific Gravity, Urine: 1.008 (ref 1.005–1.030)
WBC, UA: >50 WBC/hpf (ref 0–5)
pH: 5 (ref 5.0–8.0)

## 2023-02-26 MED ORDER — CEPHALEXIN 500 MG PO CAPS
500.0000 mg | ORAL_CAPSULE | Freq: Once | ORAL | Status: AC
  Administered 2023-02-26: 500 mg via ORAL
  Filled 2023-02-26: qty 1

## 2023-02-26 MED ORDER — CEPHALEXIN 500 MG PO CAPS: 500.0000 mg | ORAL_CAPSULE | Freq: Four times a day (QID) | ORAL | 0 refills | Status: AC

## 2023-02-26 NOTE — Discharge Instructions (Signed)
Take the antibiotics as prescribed for urinary tract infection.  Follow-up with your doctor to be rechecked.  Return to the ER for fevers chills or other concerning symptoms

## 2023-02-26 NOTE — ED Provider Notes (Signed)
[place] EMERGENCY DEPARTMENT AT [facility] Provider Note   CSN: [id number] Arrival date & time: 02/26/23  6295     History  Chief Complaint  Patient presents with   Flank Pain    Tamara Osborne is a 47 y.o. female.   Flank Pain     Patient has a history of hypertension cervical cancer diabetes chronic kidney disease who presents ED for evaluation of hematuria and some mild flank discomfort.  Patient states she had some mild flank pain yesterday.  She noted that she has been urinating a little bit more frequently.  Patient states after urinating today she noticed some blood when she was wiping.  She was concerned that it could be coming from her vaginal area because she does have history of cervical cancer and was told she should not have any more menstrual periods after her treatments.  Patient states she is only noticed a faint amount of spotting.  Home Medications Prior to Admission medications   Medication Sig Start Date End Date Taking? Authorizing Provider  cephALEXin (KEFLEX) 500 MG capsule Take 1 capsule (500 mg total) by mouth 4 (four) times daily for 5 days. 02/26/23 03/03/23 Yes Linwood Dibbles, MD  Accu-Chek Softclix Lancets lancets Use to check blood sugar TID. 11/14/20   Hoy Register, MD  amLODipine-valsartan (EXFORGE) 10-320 MG tablet Take 1 tablet by mouth daily. 11/03/22   Alyson Reedy, FNP  azelastine (ASTELIN) 0.1 % nasal spray Place 1 spray into both nostrils 2 (two) times daily as needed. Use in each nostril as directed 09/21/22   Birder Robson, MD  Blood Glucose Monitoring Suppl (ACCU-CHEK GUIDE) w/Device KIT Use to check blood sugar TID. 11/14/20   Hoy Register, MD  chlorthalidone (HYGROTON) 25 MG tablet Take 1 tablet (25 mg total) by mouth daily. Patient taking differently: Take 12.5 mg by mouth daily. 10/08/22   Chilton Si, MD  Cholecalciferol 50 MCG (2000 UT) TABS Take by mouth. 03/06/21   [provider]  doxycycline  (VIBRA-TABS) 100 MG tablet Take 1 tablet (100 mg total) by mouth 2 (two) times daily. Patient not taking: Reported on 02/10/2023 10/26/22   Lenn Sink, DPM  EPINEPHrine (EPIPEN 2-PAK) 0.3 mg/0.3 mL IJ SOAJ injection Inject 0.3 mg into the muscle as needed for anaphylaxis. 09/21/22   Birder Robson, MD  fluticasone (FLONASE) 50 MCG/ACT nasal spray Place 2 sprays into both nostrils daily. 09/21/22   Birder Robson, MD  glucose blood (ACCU-CHEK GUIDE) test strip Use to check blood sugar TID. 11/14/20   Hoy Register, MD  ipratropium (ATROVENT) 0.06 % nasal spray Place 2 sprays into both nostrils 3 (three) times daily as needed for rhinitis (drainage). 09/21/22   Birder Robson, MD  levocetirizine (XYZAL) 5 MG tablet Take 1 tablet (5 mg total) by mouth every evening. 09/21/22   Birder Robson, MD  RESTASIS 0.05 % ophthalmic emulsion 1 drop 2 (two) times daily. 08/07/22   [provider]  Semaglutide, 1 MG/DOSE, 4 MG/3ML SOPN Inject 1 mg as directed once a week. 01/27/23   William Hamburger D, NP      Allergies    Penicillins, Shellfish allergy, and Sulfa antibiotics    Review of Systems   Review of Systems  Genitourinary:  Positive for flank pain.    Physical Exam Updated Vital Signs BP (!) 174/103   Pulse (!) 107   Temp 98.8 F (37.1 C) (Oral)   Resp 20   Ht 1.549 m (  5\' 1" )   Wt 104.3 kg   SpO2 98%   BMI 43.46 kg/m  Physical Exam Vitals and nursing note reviewed.  Constitutional:      General: She is not in acute distress.    Appearance: She is well-developed.  HENT:     Head: Normocephalic and atraumatic.     Right Ear: External ear normal.     Left Ear: External ear normal.  Eyes:     General: No scleral icterus.       Right eye: No discharge.        Left eye: No discharge.     Conjunctiva/sclera: Conjunctivae normal.  Neck:     Trachea: No tracheal deviation.  Cardiovascular:     Rate and Rhythm: Normal rate and regular rhythm.  Pulmonary:     Effort: Pulmonary  effort is normal. No respiratory distress.     Breath sounds: Normal breath sounds. No stridor. No wheezing or rales.  Abdominal:     General: Bowel sounds are normal. There is no distension.     Palpations: Abdomen is soft.     Tenderness: There is no abdominal tenderness. There is no guarding or rebound.  Musculoskeletal:        General: No tenderness or deformity.     Cervical back: Neck supple.  Skin:    General: Skin is warm and dry.     Findings: No rash.  Neurological:     General: No focal deficit present.     Mental Status: She is alert.     Cranial Nerves: No cranial nerve deficit, dysarthria or facial asymmetry.     Sensory: No sensory deficit.     Motor: No abnormal muscle tone or seizure activity.     Coordination: Coordination normal.  Psychiatric:        Mood and Affect: Mood normal.     ED Results / Procedures / Treatments   Labs (all labs ordered are listed, but only abnormal results are displayed) Labs Reviewed  URINALYSIS, W/ REFLEX TO CULTURE (INFECTION SUSPECTED) - Abnormal; Notable for the following components:      Result Value   Color, Urine STRAW (*)    APPearance HAZY (*)    Hgb urine dipstick MODERATE (*)    Protein, ur 30 (*)    Leukocytes,Ua LARGE (*)    Bacteria, UA FEW (*)    All other components within normal limits  URINE CULTURE    EKG None  Radiology No results found.  Procedures Procedures    Medications Ordered in ED Medications  cephALEXin (KEFLEX) capsule 500 mg (has no administration in time range)    ED Course/ Medical Decision Making/ A&P Clinical Course as of 02/26/23 2134  Fri Feb 26, 2023  2038 Urinalysis does show greater than 50 white blood cells few bacteria large leukocyte esterase moderate hemoglobin [JK]    Clinical Course User Index [JK] Linwood Dibbles, MD                                 Medical Decision Making Problems Addressed: Hemorrhagic cystitis: acute illness or injury that poses a threat to life  or bodily functions  Amount and/or Complexity of Data Reviewed Labs: ordered. Radiology: ordered.  Risk Prescription drug management.   Patient presents to the ED for evaluation of blood when wiping after urinating.  Patient was not sure if it was coming from her vagina or urethra.  Patient has noted some urinary frequency.  Initial plan was to proceed with laboratory tests as well as a CT scan as patient did mention some flank pain.  Staff had difficulty obtaining blood from the patient.  The urinalysis does suggest a urinary tract infection.  There is evidence of blood in her urine and  I suspect this is etiology of her symptoms rather than vaginal bleeding.  Patient is feeling well she is afebrile.  Sheis not having any pain at this time.  Patient asked if she could skip the blood test and the CT scan as she has a ride here that would like to try to go home soon.  At this point I have low suspicion for pyelonephritis.  I doubt ureterolithiasis.  Patient appears stable for discharge home with a course of antibiotics and outpatient follow-up with her PCP        Final Clinical Impression(s) / ED Diagnoses Final diagnoses:  Hemorrhagic cystitis    Rx / DC Orders ED Discharge Orders          Ordered    cephALEXin (KEFLEX) 500 MG capsule  4 times daily        02/26/23 2130              Linwood Dibbles, MD 02/26/23 2134

## 2023-02-26 NOTE — ED Triage Notes (Signed)
Pt arrives via POV. Pt reports right sided flank pain yesterday, non today. She states she noticed some blood in the toilet and tissue when she urinated earlier today. Pt is unsure if the the bleeding was from her vagina or in her urine. Pt does have hx of cervical cancer. Pt is AxOx4.

## 2023-03-01 ENCOUNTER — Telehealth: Payer: Self-pay | Admitting: *Deleted

## 2023-03-01 LAB — URINE CULTURE: Culture: >=100000 — AB

## 2023-03-01 NOTE — Telephone Encounter (Signed)
Spoke with Tamara Osborne who called the office this morning stating she had been to the ED on Friday evening. Patient was complaining of bright red blood after wiping on toilet tissue only and stated that she had been having some urinary urgency and pressure. See labs and ED note. Pt started on Keflex.  Pt states she wasn't sure if the bleeding was vaginal. Pt reports still using her dilators, last saw [name] in October and is due in April for a follow up with [name]. Pt states she is not currently sexually active. Pt denies fever, chills or pain. Reports having some soreness on Right lower back before the urgency and bladder pressure started but now that soreness is gone. Pt reports she has not seen any more blood since Friday evening,and Saturday only had a pink discharge when she wipes.   Advised patient to continue with Keflex, make a follow up appt. With her PCP, and make sure to keep well hydrated. Six month follow up appt. Made with [name] for Thursday, April 10 th. At 2:30 pm, pt agreed to date and time and message forwarded to provider.

## 2023-03-02 ENCOUNTER — Encounter (HOSPITAL_BASED_OUTPATIENT_CLINIC_OR_DEPARTMENT_OTHER): Payer: Self-pay

## 2023-03-02 ENCOUNTER — Other Ambulatory Visit (HOSPITAL_BASED_OUTPATIENT_CLINIC_OR_DEPARTMENT_OTHER): Payer: Self-pay

## 2023-03-02 ENCOUNTER — Ambulatory Visit (HOSPITAL_BASED_OUTPATIENT_CLINIC_OR_DEPARTMENT_OTHER): Payer: Medicaid Other | Admitting: Family Medicine

## 2023-03-02 ENCOUNTER — Telehealth (HOSPITAL_BASED_OUTPATIENT_CLINIC_OR_DEPARTMENT_OTHER): Payer: Self-pay

## 2023-03-02 NOTE — Telephone Encounter (Signed)
 Post ED Visit - Positive Culture Follow-up: Successful Patient Follow-Up  Culture assessed and recommendations reviewed by:  [x]  Koren Or, Pharm.D. []  Venetia Gully, Pharm.D., BCPS AQ-ID []  Garrel Crews, Pharm.D., BCPS []  Almarie Lunger, Pharm.D., BCPS []  [place]incaid, [address].D., BCPS, AAHIVP []  Rosaline Bihari, Pharm.D., BCPS, AAHIVP []  Vernell Meier, PharmD, BCPS []  Latanya Hint, PharmD, BCPS []  Donald Medley, PharmD, BCPS []  Rocky Bold, PharmD  Positive urine culture  []  Patient discharged without antimicrobial prescription and treatment is now indicated [x]  Organism is resistant to prescribed ED discharge antimicrobial []  Patient with positive blood cultures  Changes discussed with ED provider: Rolan Quale, MD New antibiotic prescription Levaquin 500 mg po daily x 5 days Called to [facility] in [place]  Contacted patient, date 03/02/2023, time 9:30 am   Ruth Camelia Elbe 03/02/2023, 9:38 AM

## 2023-03-04 ENCOUNTER — Encounter (INDEPENDENT_AMBULATORY_CARE_PROVIDER_SITE_OTHER): Payer: Self-pay | Admitting: Adult Health

## 2023-03-04 ENCOUNTER — Telehealth (INDEPENDENT_AMBULATORY_CARE_PROVIDER_SITE_OTHER): Payer: Medicaid Other | Admitting: Adult Health

## 2023-03-04 ENCOUNTER — Telehealth (INDEPENDENT_AMBULATORY_CARE_PROVIDER_SITE_OTHER): Payer: Self-pay | Admitting: *Deleted

## 2023-03-04 DIAGNOSIS — I152 Hypertension secondary to endocrine disorders: Secondary | ICD-10-CM | POA: Diagnosis not present

## 2023-03-04 DIAGNOSIS — Z7985 Long-term (current) use of injectable non-insulin antidiabetic drugs: Secondary | ICD-10-CM

## 2023-03-04 DIAGNOSIS — N1832 Chronic kidney disease, stage 3b: Secondary | ICD-10-CM | POA: Diagnosis not present

## 2023-03-04 DIAGNOSIS — E1122 Type 2 diabetes mellitus with diabetic chronic kidney disease: Secondary | ICD-10-CM | POA: Diagnosis not present

## 2023-03-04 DIAGNOSIS — E1159 Type 2 diabetes mellitus with other circulatory complications: Secondary | ICD-10-CM

## 2023-03-04 DIAGNOSIS — Z6841 Body Mass Index (BMI) 40.0 and over, adult: Secondary | ICD-10-CM

## 2023-03-04 DIAGNOSIS — E669 Obesity, unspecified: Secondary | ICD-10-CM

## 2023-03-04 MED ORDER — SEMAGLUTIDE (1 MG/DOSE) 4 MG/3ML ~~LOC~~ SOPN: 1.0000 mg | PEN_INJECTOR | SUBCUTANEOUS | 0 refills | Status: DC

## 2023-03-04 NOTE — Telephone Encounter (Signed)
 Called patient to start video visit, no answer, left voice message to call back.

## 2023-03-04 NOTE — Progress Notes (Signed)
 WEIGHT SUMMARY AND BIOMETRICS  No data recorded No data recorded No data recorded Other Clinical Data Fasting: No Labs: No Today's Visit #: 50 Starting Date: 09/20/19    Chief Complaint:   OBESITY I connected with  Tamara Osborne on 03/04/23 by a video and audio enabled telemedicine application and verified that I am speaking with the correct person using two identifiers.  Patient Location: Home  Provider Location: Office/Clinic  I discussed the limitations of evaluation and management by telemedicine. The patient expressed understanding and agreed to proceed.  Tamara Osborne is here to discuss her progress with her obesity treatment plan. She is on the the Category 3 Plan and states she is following her eating plan approximately 75 % of the time. She states she is not currently exercising.  OV with converted from in clinic to MyChart Virtual due to acute URI sx's  Interim History:  Tamara Osborne has experienced UTI and URI the last several weeks. She was empirically treated with Keflex  for UTI, eventually replaced with Levaquin 500mg  daily for 5 days. She has had 3 doses thus far and states I already feel SO much better.  She reports nasal congestion, productive cough (yellow mucus), and fatigue the last 3 days. She denies fever or abdominal pain.  Subjective:   1. Type 2 diabetes mellitus with stage 3b chronic kidney disease, without long-term current use of insulin  (HCC) She has not been checking home CBG She denies sx's of hypoglycemia She is on weekly Ozempic  1mg  Denies mass in neck, dysphagia, dyspepsia, persistent hoarseness, abdominal pain, or N/V/C   2. Hypertension associated with type 2 diabetes mellitus (HCC) Home readings SBP: 110s DBP: 70s She denies sx's of hypotension She denies CP with exertion She denies tobacco/vape use She is on chlorthalidone  (HYGROTON ) 25 MG tablet  amLODipine -valsartan  (EXFORGE ) 10-320 MG tablet  Semaglutide , 1  MG/DOSE, 4 MG/3ML SOPN   Assessment/Plan:   1. Type 2 diabetes mellitus with stage 3b chronic kidney disease, without long-term current use of insulin  (HCC) Refill - Semaglutide , 1 MG/DOSE, 4 MG/3ML SOPN; Inject 1 mg as directed once a week.  Dispense: 3 mL; Refill: 0  2. Hypertension associated with type 2 diabetes mellitus (HCC) (Primary) Continue chlorthalidone  (HYGROTON ) 25 MG tablet  amLODipine -valsartan  (EXFORGE ) 10-320 MG tablet  Semaglutide , 1 MG/DOSE, 4 MG/3ML SOPN   3. BMI 40.0-44.9, adult (HCC), Current BMI 43.1  Tamara Osborne is currently in the action stage of change. As such, her goal is to continue with weight loss efforts. She has agreed to the Category 3 Plan.   Exercise goals: No exercise has been prescribed at this time.  Behavioral modification strategies: increasing lean protein intake, decreasing simple carbohydrates, increasing vegetables, increasing water intake, meal planning and cooking strategies, keeping healthy foods in the home, and planning for success.  Tamara Osborne has agreed to follow-up with our clinic in 4 weeks. She was informed of the importance of frequent follow-up visits to maximize her success with intensive lifestyle modifications for her multiple health conditions.   Objective:   There were no vitals taken for this visit. There is no height or weight on file to calculate BMI.  General: Cooperative, alert, well developed, in no acute distress. HEENT: Conjunctivae and lids unremarkable. Cardiovascular: Regular rhythm.  Lungs: Normal work of breathing. Neurologic: No focal deficits.   Lab Results  Component Value Date   CREATININE 1.6 (A) 12/09/2022   BUN 32 (A) 12/09/2022   NA 140 12/09/2022   K 4.3 12/09/2022  CL 103 12/09/2022   CO2 20 08/06/2022   Lab Results  Component Value Date   ALT 48 (H) 08/06/2022   AST 27 08/06/2022   ALKPHOS 97 08/06/2022   BILITOT 0.3 08/06/2022   Lab Results  Component Value Date   HGBA1C 5.9 (H)  08/06/2022   HGBA1C 5.3 07/16/2022   HGBA1C 5.4 06/05/2021   HGBA1C 5.2 01/13/2021   HGBA1C 7.5 (H) 10/18/2020   Lab Results  Component Value Date   INSULIN  40.3 (H) 09/20/2019   Lab Results  Component Value Date   TSH 1.850 08/06/2022   Lab Results  Component Value Date   CHOL 209 (H) 08/06/2022   HDL 41 08/06/2022   LDLCALC 138 (H) 08/06/2022   TRIG 168 (H) 08/06/2022   CHOLHDL 5.1 (H) 08/06/2022   Lab Results  Component Value Date   VD25OH 66.26 06/05/2021   VD25OH 59.37 10/18/2020   VD25OH 45.60 05/20/2020   Lab Results  Component Value Date   WBC 9.3 08/06/2022   HGB 15.3 08/06/2022   HCT 45.3 08/06/2022   MCV 83 08/06/2022   PLT 326 08/06/2022   Lab Results  Component Value Date   IRON 17 (L) 03/27/2019   TIBC 283 03/27/2019   FERRITIN 32 03/27/2019    Attestation Statements:   Reviewed by clinician on day of visit: allergies, medications, problem list, medical history, surgical history, family history, social history, and previous encounter notes.  I have reviewed the above documentation for accuracy and completeness, and I agree with the above. -  Preciosa Bundrick d. Jash Wahlen, NP-C

## 2023-03-09 ENCOUNTER — Ambulatory Visit (HOSPITAL_BASED_OUTPATIENT_CLINIC_OR_DEPARTMENT_OTHER): Payer: Medicaid Other | Admitting: *Deleted

## 2023-03-09 DIAGNOSIS — N390 Urinary tract infection, site not specified: Secondary | ICD-10-CM

## 2023-03-09 LAB — POCT URINALYSIS DIP (CLINITEK)
Bilirubin, UA: NEGATIVE
Glucose, UA: NEGATIVE mg/dL
Ketones, POC UA: NEGATIVE mg/dL
Nitrite, UA: NEGATIVE
POC PROTEIN,UA: 100 — AB
Spec Grav, UA: 1.02 (ref 1.010–1.025)
Urobilinogen, UA: 0.2 U/dL
pH, UA: 5.5 (ref 5.0–8.0)

## 2023-03-09 NOTE — Progress Notes (Signed)
 Patient came in for a repeat urine due to recent UTI. Urinalysis obtained and documented. Urine culture was also sent for review.

## 2023-03-10 ENCOUNTER — Encounter (HOSPITAL_BASED_OUTPATIENT_CLINIC_OR_DEPARTMENT_OTHER): Payer: Self-pay | Admitting: Family Medicine

## 2023-03-10 LAB — URINE CULTURE: Organism ID, Bacteria: NO GROWTH

## 2023-03-11 ENCOUNTER — Other Ambulatory Visit (HOSPITAL_BASED_OUTPATIENT_CLINIC_OR_DEPARTMENT_OTHER): Payer: Self-pay | Admitting: Family Medicine

## 2023-03-11 DIAGNOSIS — R3121 Asymptomatic microscopic hematuria: Secondary | ICD-10-CM

## 2023-03-11 DIAGNOSIS — R809 Proteinuria, unspecified: Secondary | ICD-10-CM

## 2023-03-15 NOTE — Therapy (Signed)
 OUTPATIENT PHYSICAL THERAPY FEMALE PELVIC EVALUATION   Patient Name: Tamara Osborne MRN: [id number] DOB:September 29, 1975, 48 y.o., female Today's Date: 03/16/2023  END OF SESSION:  PT End of Session - 03/16/23 1312     Visit Number 1    Date for PT Re-Evaluation 09/13/23    Authorization Type [employer]    PT Start Time 1300    PT Stop Time 1345    PT Time Calculation (min) 45 min    Activity Tolerance Patient tolerated treatment well    Behavior During Therapy WFL for tasks assessed/performed             Past Medical History:  Diagnosis Date   Anemia    Angio-edema    Anxiety    Bartholin cyst    Cervical cancer (HCC)    Chronic kidney disease    Constipation    Depression    Deviated septum    Diabetes mellitus without complication (HCC)    Difficult intravenous access    used port for 05-09-2019 surgery   Fibroids    History of blood transfusion    2 units given 04-26-2019, has had total of 8 units since jan 2021   History of blood transfusion 1 unit given 05-30-19   iv fluids also given   History of cervical cancer 07/16/2022   History of kidney problems    History of radiation therapy 03/23/2019-05/04/2019   Cervical external beam   Dr Lynwood Nasuti   History of radiation therapy 05/09/2019-06/05/2019   vaginal brachytherapy   Dr Lynwood Nasuti   History of recent blood transfusion 05/16/2019   2 units given  [facility] dr melrose [facility]   Hypertension    Neuropathy    both thumbs   Obesity    Palpitations    PONV (postoperative nausea and vomiting)    Prediabetes    Preeclampsia 2006   Sleep apnea    no cpap used insurance would not cover, osa severe per pt   SOB (shortness of breath)    Squamous cell carcinoma of cervix (HCC) 03/23/2019   Swallowing difficulty    Symptomatic skin lesion 07/14/2022   Vitamin D  deficiency 01/10/2020   Past Surgical History:  Procedure Laterality Date   CESAREAN SECTION WITH BILATERAL TUBAL LIGATION  11/10/2004        DILATION AND CURETTAGE OF UTERUS  1997   IR IMAGING GUIDED PORT INSERTION  04/04/2019   IR REMOVAL TUN ACCESS W/ PORT W/O FL MOD SED  10/27/2021   OPERATIVE ULTRASOUND N/A 05/09/2019   Procedure: OPERATIVE ULTRASOUND;  Surgeon: Nasuti Lynwood, MD;  Location: [facility];  Service: Urology;  Laterality: N/A;   OPERATIVE ULTRASOUND N/A 05/15/2019   Procedure: OPERATIVE ULTRASOUND;  Surgeon: Nasuti Lynwood, MD;  Location: [facility];  Service: Urology;  Laterality: N/A;   OPERATIVE ULTRASOUND N/A 05/22/2019   Procedure: OPERATIVE ULTRASOUND;  Surgeon: Nasuti Lynwood, MD;  Location: [facility];  Service: Urology;  Laterality: N/A;   OPERATIVE ULTRASOUND N/A 05/29/2019   Procedure: OPERATIVE ULTRASOUND;  Surgeon: Nasuti Lynwood, MD;  Location: [facility];  Service: Urology;  Laterality: N/A;   OPERATIVE ULTRASOUND N/A 06/05/2019   Procedure: OPERATIVE ULTRASOUND;  Surgeon: Nasuti Lynwood, MD;  Location[facility];  Service: Urology;  Laterality: N/A;   TANDEM RING INSERTION N/A 05/09/2019   Procedure: TANDEM RING INSERTION;  Surgeon: Nasuti Lynwood, MD;  Location: [facility];  Service: Urology;  Laterality:  N/A;   TANDEM RING INSERTION N/A 05/15/2019   Procedure: TANDEM RING INSERTION;  Surgeon: Shannon Agent, MD;  Location: [facility];  Service: Urology;  Laterality: N/A;   TANDEM RING INSERTION N/A 05/22/2019   Procedure: TANDEM RING INSERTION;  Surgeon: Shannon Agent, MD;  Location: [facility];  Service: Urology;  Laterality: N/A;   TANDEM RING INSERTION N/A 05/29/2019   Procedure: TANDEM RING INSERTION;  Surgeon: Shannon Agent, MD;  Location: [facility];  Service: Urology;  Laterality: N/A;   TANDEM RING INSERTION N/A 06/05/2019   Procedure: TANDEM RING INSERTION;  Surgeon: Shannon Agent, MD;  Location: [facility];  Service: Urology;  Laterality: N/A;    Patient Active Problem List   Diagnosis Date Noted   HSV-2 infection 07/13/2022   Dyspareunia in female 10/23/2021   Hyperlipidemia associated with type 2 diabetes mellitus (HCC) 07/03/2021   OSA (obstructive sleep apnea) 07/31/2020   Other hyperlipidemia 01/10/2020   Insulin  resistance 01/10/2020   Class 2 severe obesity with serious comorbidity and body mass index (BMI) of 39.0 to 39.9 in adult [facility]) 01/10/2020   Peripheral 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) 04/25/2019   CKD (chronic kidney disease), symptom management only, stage 3 (moderate) (HCC) 03/30/2019   Generalized anxiety disorder 03/30/2019   Hypertension associated with type 2 diabetes mellitus (HCC)    Squamous cell carcinoma of cervix (HCC) 03/23/2019   Deficiency anemia 03/22/2019    PCP: Towana Small, FNP  REFERRING PROVIDER:  C53.9 (ICD-10-CM) - Squamous cell carcinoma of cervix (HCC)   REFERRING DIAG: Shannon Agent, MD   THERAPY DIAG:  Muscle weakness (generalized)  Pelvic pain  Rationale for Evaluation and Treatment: Rehabilitation  ONSET DATE: 11/2022   SUBJECTIVE:                                                                                                                                                                                           SUBJECTIVE STATEMENT: Patient had therapy in the past but has a history of radiation in the pelvic region. She started to have soreness with using the dilator. She has discomfort with pelvic exam. Patient has pain and difficulty with penile penetration. Patient is using the vaginal moisturizer, vaginal dilator.  Fluid intake: Yes: water    PAIN:  Are you having pain? Yes NPRS scale: 7/10 Pain location: Vaginal  Pain type: sharp and soreness that can extend for several days  Pain description: intermittent   Aggravating factors: vaginal penetration Relieving factors: time  PRECAUTIONS: Other: cancer with radiation and chemotherapy  RED  FLAGS: None   WEIGHT BEARING RESTRICTIONS: No  FALLS:  Has patient fallen in last 6 months? 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    BOWEL MOVEMENT: no issues   URINATION: Pain with urination: No Fully empty bladder: Yes:   Stream: Strong Urgency: Yes:   Frequency: every 3 hours, does not wake up during the night Leakage: Coughing and Sneezing Pads: Yes: when out of home  INTERCOURSE: Pain with intercourse: After Intercourse Ability to have vaginal penetration:  Yes:   Climax: not able to [employer] Scale: 1/3  PREGNANCY: Vaginal deliveries 1 Tearing Yes:    PROLAPSE: None   OBJECTIVE:  Note: Objective measures were completed at Evaluation unless otherwise noted.   PATIENT SURVEYS:  POPIQ-7 38 PFIQ-7 38  COGNITION: Overall cognitive status: Within functional limits for tasks assessed     SENSATION: Light touch: Appears intact Proprioception: Appears intact    POSTURE: rounded shoulders, forward head, and decreased lumbar lordosis  PELVIC ALIGNMENT:correct alignment  LUMBARAROM/PROM: lumbar ROM decreased by 50%   LOWER EXTREMITY MNF:qloo ROM in bilateral hips.  Tightness in the hip adductors.     LOWER EXTREMITY MMT: bilateral hip strength is 5/5   PALPATION:   General  Difficulty contracting abdominals and performing diaphragmatic breathing.                 External Perineal Exam tightness in the urogenital diaphragm, tenderness located in the levator ani and perineal body                             Internal Pelvic Floor tightness throughout the pelvic floor muscles, her vaginal canal is shortened  Patient confirms identification and  approves PT to assess internal pelvic floor and treatment Yes  PELVIC MMT:   MMT eval  Vaginal 2/5  (Blank rows = not tested)        TONE: increased    TODAY'S TREATMENT:                                                                                                                              DATE: 03/16/23  EVAL See below   PATIENT EDUCATION:  Education details: gave patient information on Ohnut due to shortened vaginal canal, and information on dry needling Person educated: Patient Education method: Explanation Education comprehension: verbalized understanding  HOME EXERCISE PROGRAM: See above.   ASSESSMENT:  CLINICAL IMPRESSION: Patient is a 48 y.o. female who was seen today for physical therapy evaluation and treatment for pelvic pain. Patient has a history of squamous cell carcinoma of cervix s/p radiation and chemotherapy. She is presently using her dilators and vaginal moisturizers. Patient reports she leaks urine with coughing and  sneezing. Pelvic floor strength is 2/5 with holding 5 sec and difficulty with full relaxation. Her vaginal canal is shortened from her past radiation treatments. She has increased tissue tension and pain  throughout the pelvic floor. She has tenderness externally in the perineal body and levator ani. Patient has pain with vaginal penetration and soreness will last several days afterwards at level 7/10.  Patient will benefit from skilled therapy to improve lengthening of the vaginal tissue to reduce pain after penetration and improve strength to reduce leakage.   OBJECTIVE IMPAIRMENTS: decreased coordination, decreased strength, increased fascial restrictions, increased muscle spasms, and pain.   ACTIVITY LIMITATIONS: continence  PARTICIPATION LIMITATIONS: interpersonal relationship and community activity  PERSONAL FACTORS: 3+ comorbidities: hx of hypertension, preeclampsia, hyperlipidemia, OSA, diabetes, squamous cell carcinoma of cervix s/p  radiation and chemotherapy, morbid obesity  are also affecting patient's functional outcome.   REHAB POTENTIAL: Excellent  CLINICAL DECISION MAKING: Evolving/moderate complexity  EVALUATION COMPLEXITY: Moderate   GOALS: Goals reviewed with patient? Yes  SHORT TERM GOALS: Target date: 04/13/23  Patient independent with initial flexibility HEP.  Baseline: not educated yet Goal status: INITIAL   LONG TERM GOALS: Target date: 09/13/23  Patient independent with advanced HEP for core and pelvic floor strength.  Baseline: not educated yet Goal status: INITIAL  2.  Patient understands ways to manage pelvic pain with penetration with the Ohnut, breathing techniques, warming techniques, and ways to relax the tissue.  Baseline: not educated yet.  Goal status: INITIAL  3.  Pelvic floor strength is >/= 3/5 to reduce urinary leakage >/= 50% with coughing and sneezing.  Baseline: Pelvic strength 2/5 Goal status: INITIAL  4.  Patient is able to have vaginal penetration with pain afterwards reduced >/= 4/10 due to lengthening of the tissue.  Baseline: pain level 7/10 Goal status: INITIAL  5.  PLAN:  PT FREQUENCY: 1-2x/week  PT DURATION: 6 months  PLANNED INTERVENTIONS: 97110-Therapeutic exercises, 97530- Therapeutic activity, 97112- Neuromuscular re-education, [id number]- Manual therapy, Patient/Family education, Dry Needling, Spinal mobilization, and Biofeedback  PLAN FOR NEXT SESSION: talk about dry needling, diaphragmatic breathing, hip stretches for the inner thigh, manual work to the gluteal fold, inner thigh, urogenital diaphragm    Channing Pereyra, PT 03/16/23 3:00 PM

## 2023-03-16 ENCOUNTER — Encounter: Payer: Medicaid Other | Attending: Radiation Oncology | Admitting: Physical Therapy

## 2023-03-16 ENCOUNTER — Encounter: Payer: Self-pay | Admitting: Physical Therapy

## 2023-03-16 ENCOUNTER — Other Ambulatory Visit: Payer: Self-pay

## 2023-03-16 DIAGNOSIS — C539 Malignant neoplasm of cervix uteri, unspecified: Secondary | ICD-10-CM | POA: Diagnosis present

## 2023-03-16 DIAGNOSIS — R102 Pelvic and perineal pain: Secondary | ICD-10-CM | POA: Insufficient documentation

## 2023-03-16 DIAGNOSIS — M6281 Muscle weakness (generalized): Secondary | ICD-10-CM | POA: Diagnosis not present

## 2023-03-16 NOTE — Patient Instructions (Signed)

## 2023-03-23 ENCOUNTER — Encounter: Payer: Self-pay | Admitting: Physical Therapy

## 2023-03-23 ENCOUNTER — Encounter: Payer: Medicaid Other | Admitting: Physical Therapy

## 2023-03-23 DIAGNOSIS — M6281 Muscle weakness (generalized): Secondary | ICD-10-CM

## 2023-03-23 DIAGNOSIS — C539 Malignant neoplasm of cervix uteri, unspecified: Secondary | ICD-10-CM | POA: Diagnosis not present

## 2023-03-23 DIAGNOSIS — R102 Pelvic and perineal pain: Secondary | ICD-10-CM

## 2023-03-23 NOTE — Therapy (Signed)
OUTPATIENT PHYSICAL THERAPY FEMALE PELVIC TREATMENT   Patient Name: Tamara Osborne MRN: [id number] DOB:05/08/1975, 48 y.o., female Today's Date: 03/23/2023  END OF SESSION:  PT End of Session - 03/23/23 1307     Visit Number 2    Date for PT Re-Evaluation 09/13/23    Authorization Type [employer]    Authorization Time Period 03/16/2023 - 06/13/2023    Authorization - Visit Number 1    Authorization - Number of Visits 11    PT Start Time 1305    PT Stop Time 1350    PT Time Calculation (min) 45 min    Activity Tolerance Patient tolerated treatment well    Behavior During Therapy WFL for tasks assessed/performed             Past Medical History:  Diagnosis Date   Anemia    Angio-edema    Anxiety    Bartholin cyst    Cervical cancer (HCC)    Chronic kidney disease    Constipation    Depression    Deviated septum    Diabetes mellitus without complication (HCC)    Difficult intravenous access    used port for 05-09-2019 surgery   Fibroids    History of blood transfusion    2 units given 04-26-2019, has had total of 8 units since jan 2021   History of blood transfusion 1 unit given 05-30-19   iv fluids also given   History of cervical cancer 07/16/2022   History of kidney problems    History of radiation therapy 03/23/2019-05/04/2019   Cervical external beam   Dr Antony Blackbird   History of radiation therapy 05/09/2019-06/05/2019   vaginal brachytherapy   Dr Antony Blackbird   History of recent blood transfusion 05/16/2019   2 units given  per dr Lambert Mody at cancer center   Hypertension    Neuropathy    both thumbs   Obesity    Palpitations    PONV (postoperative nausea and vomiting)    Prediabetes    Preeclampsia 2006   Sleep apnea    no cpap used insurance would not cover, osa severe per pt   SOB (shortness of breath)    Squamous cell carcinoma of cervix (HCC) 03/23/2019   Swallowing difficulty    Symptomatic skin lesion 07/14/2022   Vitamin D deficiency 01/10/2020    Past Surgical History:  Procedure Laterality Date   CESAREAN SECTION WITH BILATERAL TUBAL LIGATION  11/10/2004       DILATION AND CURETTAGE OF UTERUS  1997   IR IMAGING GUIDED PORT INSERTION  04/04/2019   IR REMOVAL TUN ACCESS W/ PORT W/O FL MOD SED  10/27/2021   OPERATIVE ULTRASOUND N/A 05/09/2019   Procedure: OPERATIVE ULTRASOUND;  Surgeon: Antony Blackbird, MD;  Location: [facility];  Service: Urology;  Laterality: N/A;   OPERATIVE ULTRASOUND N/A 05/15/2019   Procedure: OPERATIVE ULTRASOUND;  Surgeon: Antony Blackbird, MD;  Location: [facility];  Service: Urology;  Laterality: N/A;   OPERATIVE ULTRASOUND N/A 05/22/2019   Procedure: OPERATIVE ULTRASOUND;  Surgeon: Antony Blackbird, MD;  Location: [facility];  Service: Urology;  Laterality: N/A;   OPERATIVE ULTRASOUND N/A 05/29/2019   Procedure: OPERATIVE ULTRASOUND;  Surgeon: Antony Blackbird, MD;  Location: [facility];  Service: Urology;  Laterality: N/A;   OPERATIVE ULTRASOUND N/A 06/05/2019   Procedure: OPERATIVE ULTRASOUND;  Surgeon: Antony Blackbird, MD;  Location: [facility];  Service: Urology;  Laterality: N/A;   TANDEM  RING INSERTION N/A 05/09/2019   Procedure: TANDEM RING INSERTION;  Surgeon: Antony Blackbird, MD;  Location: [facility];  Service: Urology;  Laterality: N/A;   TANDEM RING INSERTION N/A 05/15/2019   Procedure: TANDEM RING INSERTION;  Surgeon: Antony Blackbird, MD;  Location: [facility];  Service: Urology;  Laterality: N/A;   TANDEM RING INSERTION N/A 05/22/2019   Procedure: TANDEM RING INSERTION;  Surgeon: Antony Blackbird, MD;  Location: [facility];  Service: Urology;  Laterality: N/A;   TANDEM RING INSERTION N/A 05/29/2019   Procedure: TANDEM RING INSERTION;  Surgeon: Antony Blackbird, MD;  Location: [facility];  Service: Urology;  Laterality: N/A;   TANDEM RING INSERTION N/A 06/05/2019   Procedure: TANDEM RING  INSERTION;  Surgeon: Antony Blackbird, MD;  Location: [facility];  Service: Urology;  Laterality: N/A;   Patient Active Problem List   Diagnosis Date Noted   HSV-2 infection 07/13/2022   Dyspareunia in female 10/23/2021   Hyperlipidemia associated with type 2 diabetes mellitus (HCC) 07/03/2021   OSA (obstructive sleep apnea) 07/31/2020   Other hyperlipidemia 01/10/2020   Insulin resistance 01/10/2020   Class 2 severe obesity with serious comorbidity and body mass index (BMI) of 39.0 to 39.9 in adult [facility]) 01/10/2020   Peripheral 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) 04/25/2019   CKD (chronic kidney disease), symptom management only, stage 3 (moderate) (HCC) 03/30/2019   Generalized anxiety disorder 03/30/2019   Hypertension associated with type 2 diabetes mellitus (HCC)    Squamous cell carcinoma of cervix (HCC) 03/23/2019   Deficiency anemia 03/22/2019    PCP: Alyson Reedy, FNP  REFERRING PROVIDER:  C53.9 (ICD-10-CM) - Squamous cell carcinoma of cervix (HCC)   REFERRING DIAG: Antony Blackbird, MD   THERAPY DIAG:  Muscle weakness (generalized)  Pelvic pain  Rationale for Evaluation and Treatment: Rehabilitation  ONSET DATE: 11/2022   SUBJECTIVE:                                                                                                                                                                                           SUBJECTIVE STATEMENT: I have used the dilator. The dilator does not bother me. I do get some numbness along the lateral upper thigh and moving my leg up and down helps.  Fluid intake: Yes: water    PAIN:  Are you having pain? Yes NPRS scale: 7/10 Pain location: Vaginal  Pain type: sharp and soreness that can extend for several days  Pain description: intermittent   Aggravating factors: vaginal penetration Relieving factors: time  PRECAUTIONS: Other: cancer with radiation and chemotherapy  RED FLAGS:  None   WEIGHT BEARING  RESTRICTIONS: No  FALLS:  Has patient fallen in last 6 months? 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    BOWEL MOVEMENT: no issues   URINATION: Pain with urination: No Fully empty bladder: Yes:   Stream: Strong Urgency: Yes:   Frequency: every 3 hours, does not wake up during the night Leakage: Coughing and Sneezing Pads: Yes: when out of home  INTERCOURSE: Pain with intercourse: After Intercourse Ability to have vaginal penetration:  Yes:   Climax: not able to [employer] Scale: 1/3  PREGNANCY: Vaginal deliveries 1 Tearing Yes:    PROLAPSE: None   OBJECTIVE:  Note: Objective measures were completed at Evaluation unless otherwise noted.   PATIENT SURVEYS:  POPIQ-7 38 PFIQ-7 38  COGNITION: Overall cognitive status: Within functional limits for tasks assessed     SENSATION: Light touch: Appears intact Proprioception: Appears intact    POSTURE: rounded shoulders, forward head, and decreased lumbar lordosis  PELVIC ALIGNMENT:correct alignment  LUMBARAROM/PROM: lumbar ROM decreased by 50%   LOWER EXTREMITY ZOX:WRUE ROM in bilateral hips.  Tightness in the hip adductors.     LOWER EXTREMITY MMT: bilateral hip strength is 5/5   PALPATION:   General  Difficulty contracting abdominals and performing diaphragmatic breathing.                 External Perineal Exam tightness in the urogenital diaphragm, tenderness located in the levator ani and perineal body                             Internal Pelvic Floor tightness throughout the pelvic floor muscles, her vaginal canal is shortened  Patient confirms identification and approves PT to assess internal pelvic  floor and treatment Yes  PELVIC MMT:   MMT eval  Vaginal 2/5  (Blank rows = not tested)        TONE: increased    TODAY'S TREATMENT:   03/23/23 Manual: Soft tissue mobilization: Manual work to the hip adductors and urogenital diaphragm to improve tissue mobility.  Internal pelvic floor techniques: No emotional/communication barriers or cognitive limitation. Patient is motivated to learn. Patient understands and agrees with treatment goals and plan. PT explains patient will be examined in standing, sitting, and lying down to see how their muscles and joints work. When they are ready, they will be asked to remove their underwear so PT can examine their perineum. The patient is also given the option of providing their own chaperone as one is not provided in our facility. The patient also has the right and is explained the right to defer or refuse any part of the evaluation or treatment including the internal exam. With the patient's consent, PT will use one gloved finger to gently assess the muscles of the pelvic floor, seeing how well it contracts and relaxes and if there is muscle symmetry. After, the patient will get dressed and PT and patient will discuss exam findings and plan of care. PT and patient discuss plan of care, schedule, attendance policy and HEP activities.  Using therapist index finger going through the vaginal opening working on the levator ani, obturator internist, posterior vaginal canal and sides of the  bladder to release the tissue and improve the mobility Exercises: Stretches/mobility: Hamstring stretch in supine with strap holding 30 sec bil.  Supine hip adductor stretch with strap holding 30 sec bil.  Supine lateral hip stretch with strap holding 30 sec bil Supine hip flexor stretch with the therapist massaging the anterior hips Strengthening: Therapeutic activities: Functional strengthening activities: Educated patient about the ohnut to use due to the decreased  length of her vaginal canal.                                                                                                                                DATE: 03/16/23  EVAL See below   PATIENT EDUCATION:  Education details: gave patient information on Ohnut due to shortened vaginal canal, and information on dry needling Person educated: Patient Education method: Explanation Education comprehension: verbalized understanding  HOME EXERCISE PROGRAM: See above.   ASSESSMENT:  CLINICAL IMPRESSION: Patient is a 48 y.o. female who was seen today for physical therapy  treatment for pelvic pain. Patient has a history of squamous cell carcinoma of cervix s/p radiation and chemotherapy. Patient pelvic floor muscles responded well to the manual work and was softer. She has tightness in the anterior hips. She is learning how to do her stretches again.  Patient will benefit from skilled therapy to improve lengthening of the vaginal tissue to reduce pain after penetration and improve strength to reduce leakage.   OBJECTIVE IMPAIRMENTS: decreased coordination, decreased strength, increased fascial restrictions, increased muscle spasms, and pain.   ACTIVITY LIMITATIONS: continence  PARTICIPATION LIMITATIONS: interpersonal relationship and community activity  PERSONAL FACTORS: 3+ comorbidities: hx of hypertension, preeclampsia, hyperlipidemia, OSA, diabetes, squamous cell carcinoma of cervix s/p radiation and chemotherapy, morbid obesity  are also affecting patient's functional outcome.   REHAB POTENTIAL: Excellent  CLINICAL DECISION MAKING: Evolving/moderate complexity  EVALUATION COMPLEXITY: Moderate   GOALS: Goals reviewed with patient? Yes  SHORT TERM GOALS: Target date: 04/13/23  Patient independent with initial flexibility HEP.  Baseline: not educated yet Goal status: INITIAL   LONG TERM GOALS: Target date: 09/13/23  Patient independent with advanced HEP for core and pelvic floor  strength.  Baseline: not educated yet Goal status: INITIAL  2.  Patient understands ways to manage pelvic pain with penetration with the Ohnut, breathing techniques, warming techniques, and ways to relax the tissue.  Baseline: not educated yet.  Goal status: INITIAL  3.  Pelvic floor strength is >/= 3/5 to reduce urinary leakage >/= 50% with coughing and sneezing.  Baseline: Pelvic strength 2/5 Goal status: INITIAL  4.  Patient is able to have vaginal penetration with pain afterwards reduced >/= 4/10 due to lengthening of the tissue.  Baseline: pain level 7/10 Goal status: INITIAL  5.  PLAN:  PT FREQUENCY: 1-2x/week  PT DURATION: 6 months  PLANNED INTERVENTIONS: 97110-Therapeutic exercises, [id number]- Therapeutic activity, [id number]- Neuromuscular re-education, [id number]- Manual therapy, Patient/Family education, Dry Needling, Spinal mobilization, and Biofeedback  PLAN FOR NEXT SESSION: talk about dry needling, diaphragmatic breathing, hip stretches for the inner thigh, manual work to the pelvic floor   Eulis Foster, PT 03/23/23 2:03 PM

## 2023-03-30 ENCOUNTER — Encounter: Payer: Self-pay | Admitting: Physical Therapy

## 2023-03-30 ENCOUNTER — Encounter: Payer: Medicaid Other | Admitting: Physical Therapy

## 2023-03-30 DIAGNOSIS — C539 Malignant neoplasm of cervix uteri, unspecified: Secondary | ICD-10-CM | POA: Diagnosis not present

## 2023-03-30 DIAGNOSIS — R102 Pelvic and perineal pain: Secondary | ICD-10-CM

## 2023-03-30 DIAGNOSIS — M6281 Muscle weakness (generalized): Secondary | ICD-10-CM

## 2023-03-30 NOTE — Therapy (Signed)
OUTPATIENT PHYSICAL THERAPY FEMALE PELVIC TREATMENT   Patient Name: Tamara Osborne MRN: [id number] DOB:10/10/1975, 48 y.o., female Today's Date: 03/30/2023  END OF SESSION:  PT End of Session - 03/30/23 1300     Visit Number 3    Date for PT Re-Evaluation 09/13/23    Authorization Type [employer]    Authorization Time Period 03/16/2023 - 06/13/2023    Authorization - Visit Number 2    Authorization - Number of Visits 11    PT Start Time 1300    PT Stop Time 1345    PT Time Calculation (min) 45 min    Activity Tolerance Patient tolerated treatment well    Behavior During Therapy WFL for tasks assessed/performed             Past Medical History:  Diagnosis Date   Anemia    Angio-edema    Anxiety    Bartholin cyst    Cervical cancer (HCC)    Chronic kidney disease    Constipation    Depression    Deviated septum    Diabetes mellitus without complication (HCC)    Difficult intravenous access    used port for 05-09-2019 surgery   Fibroids    History of blood transfusion    2 units given 04-26-2019, has had total of 8 units since jan 2021   History of blood transfusion 1 unit given 05-30-19   iv fluids also given   History of cervical cancer 07/16/2022   History of kidney problems    History of radiation therapy 03/23/2019-05/04/2019   Cervical external beam   Dr Antony Blackbird   History of radiation therapy 05/09/2019-06/05/2019   vaginal brachytherapy   Dr Antony Blackbird   History of recent blood transfusion 05/16/2019   2 units given  per dr Lambert Mody at cancer center   Hypertension    Neuropathy    both thumbs   Obesity    Palpitations    PONV (postoperative nausea and vomiting)    Prediabetes    Preeclampsia 2006   Sleep apnea    no cpap used insurance would not cover, osa severe per pt   SOB (shortness of breath)    Squamous cell carcinoma of cervix (HCC) 03/23/2019   Swallowing difficulty    Symptomatic skin lesion 07/14/2022   Vitamin D deficiency 01/10/2020    Past Surgical History:  Procedure Laterality Date   CESAREAN SECTION WITH BILATERAL TUBAL LIGATION  11/10/2004       DILATION AND CURETTAGE OF UTERUS  1997   IR IMAGING GUIDED PORT INSERTION  04/04/2019   IR REMOVAL TUN ACCESS W/ PORT W/O FL MOD SED  10/27/2021   OPERATIVE ULTRASOUND N/A 05/09/2019   Procedure: OPERATIVE ULTRASOUND;  Surgeon: Antony Blackbird, MD;  Location: [facility];  Service: Urology;  Laterality: N/A;   OPERATIVE ULTRASOUND N/A 05/15/2019   Procedure: OPERATIVE ULTRASOUND;  Surgeon: Antony Blackbird, MD;  Location: [facility];  Service: Urology;  Laterality: N/A;   OPERATIVE ULTRASOUND N/A 05/22/2019   Procedure: OPERATIVE ULTRASOUND;  Surgeon: Antony Blackbird, MD;  Location: [facility];  Service: Urology;  Laterality: N/A;   OPERATIVE ULTRASOUND N/A 05/29/2019   Procedure: OPERATIVE ULTRASOUND;  Surgeon: Antony Blackbird, MD;  Location: [facility];  Service: Urology;  Laterality: N/A;   OPERATIVE ULTRASOUND N/A 06/05/2019   Procedure: OPERATIVE ULTRASOUND;  Surgeon: Antony Blackbird, MD;  Location: [facility];  Service: Urology;  Laterality: N/A;   TANDEM  RING INSERTION N/A 05/09/2019   Procedure: TANDEM RING INSERTION;  Surgeon: Antony Blackbird, MD;  Location: [facility];  Service: Urology;  Laterality: N/A;   TANDEM RING INSERTION N/A 05/15/2019   Procedure: TANDEM RING INSERTION;  Surgeon: Antony Blackbird, MD;  Location: [facility];  Service: Urology;  Laterality: N/A;   TANDEM RING INSERTION N/A 05/22/2019   Procedure: TANDEM RING INSERTION;  Surgeon: Antony Blackbird, MD;  Location: [facility];  Service: Urology;  Laterality: N/A;   TANDEM RING INSERTION N/A 05/29/2019   Procedure: TANDEM RING INSERTION;  Surgeon: Antony Blackbird, MD;  Location: [facility];  Service: Urology;  Laterality: N/A;   TANDEM RING INSERTION N/A 06/05/2019   Procedure: TANDEM RING  INSERTION;  Surgeon: Antony Blackbird, MD;  Location: [facility];  Service: Urology;  Laterality: N/A;   Patient Active Problem List   Diagnosis Date Noted   HSV-2 infection 07/13/2022   Dyspareunia in female 10/23/2021   Hyperlipidemia associated with type 2 diabetes mellitus (HCC) 07/03/2021   OSA (obstructive sleep apnea) 07/31/2020   Other hyperlipidemia 01/10/2020   Insulin resistance 01/10/2020   Class 2 severe obesity with serious comorbidity and body mass index (BMI) of 39.0 to 39.9 in adult [facility]) 01/10/2020   Peripheral 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) 04/25/2019   CKD (chronic kidney disease), symptom management only, stage 3 (moderate) (HCC) 03/30/2019   Generalized anxiety disorder 03/30/2019   Hypertension associated with type 2 diabetes mellitus (HCC)    Squamous cell carcinoma of cervix (HCC) 03/23/2019   Deficiency anemia 03/22/2019    PCP: Alyson Reedy, FNP  REFERRING PROVIDER:  C53.9 (ICD-10-CM) - Squamous cell carcinoma of cervix (HCC)   REFERRING DIAG: Antony Blackbird, MD   THERAPY DIAG:  Muscle weakness (generalized)  Pelvic pain  Rationale for Evaluation and Treatment: Rehabilitation  ONSET DATE: 11/2022   SUBJECTIVE:                                                                                                                                                                                           SUBJECTIVE STATEMENT: Everything is going well. I talked to my boyfriend about the Ohnut and will order in the future.  Fluid intake: Yes: water    PAIN:  Are you having pain? Yes NPRS scale: 7/10 Pain location: Vaginal  Pain type: sharp and soreness that can extend for several days  Pain description: intermittent   Aggravating factors: vaginal penetration Relieving factors: time  PRECAUTIONS: Other: cancer with radiation and chemotherapy  RED FLAGS: None   WEIGHT BEARING RESTRICTIONS: No  FALLS:  Has  patient fallen in  last 6 months? 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    BOWEL MOVEMENT: no issues   URINATION: Pain with urination: No Fully empty bladder: Yes:   Stream: Strong Urgency: Yes:   Frequency: every 3 hours, does not wake up during the night Leakage: Coughing and Sneezing Pads: Yes: when out of home  INTERCOURSE: Pain with intercourse: After Intercourse Ability to have vaginal penetration:  Yes:   [place]ax: not able to [employer] Scale: 1/3  PREGNANCY: Vaginal deliveries 1 Tearing Yes:    PROLAPSE: None   OBJECTIVE:  Note: Objective measures were completed at Evaluation unless otherwise noted.   PATIENT SURVEYS:  POPIQ-7 38 PFIQ-7 38  COGNITION: Overall cognitive status: Within functional limits for tasks assessed     SENSATION: Light touch: Appears intact Proprioception: Appears intact    POSTURE: rounded shoulders, forward head, and decreased lumbar lordosis  PELVIC ALIGNMENT:correct alignment  LUMBARAROM/PROM: lumbar ROM decreased by 50%   LOWER EXTREMITY ZOX:WRUE ROM in bilateral hips.  Tightness in the hip adductors.     LOWER EXTREMITY MMT: bilateral hip strength is 5/5   PALPATION:   General  Difficulty contracting abdominals and performing diaphragmatic breathing.                 External Perineal Exam tightness in the urogenital diaphragm, tenderness located in the levator ani and perineal body                             Internal Pelvic Floor tightness throughout the pelvic floor muscles, her vaginal canal is shortened  Patient confirms identification and approves PT to assess internal pelvic floor and treatment Yes  PELVIC MMT:   MMT eval  03/30/23  Vaginal 2/5 3/5  (Blank rows = not tested)        TONE: increased    TODAY'S TREATMENT:   03/30/23 Manual: Internal pelvic floor techniques: No emotional/communication barriers or cognitive limitation. Patient is motivated to learn. Patient understands and agrees with treatment goals and plan. PT explains patient will be examined in standing, sitting, and lying down to see how their muscles and joints work. When they are ready, they will be asked to remove their underwear so PT can examine their perineum. The patient is also given the option of providing their own chaperone as one is not provided in our facility. The patient also has the right and is explained the right to defer or refuse any part of the evaluation or treatment including the internal exam. With the patient's consent, PT will use one gloved finger to gently assess the muscles of the pelvic floor, seeing how well it contracts and relaxes and if there is muscle symmetry. After, the patient will get dressed and PT and patient will discuss exam findings and plan of care. PT and patient discuss plan of care, schedule, attendance policy and HEP activities.  Therapist finger in the vaginal canal working on lengthening of the canal, working around the bladder, manual work to the [employer] and obturator internist Exercises: Stretches/mobility: Piriformis stretch holding 30 sec bil. In sitting Sitting hamstring stretch holding 30 sec bil.  Sitting hip adductor stretch holding 30  sec each Hip flexor stretch with doorway stretch 30 sec bil.  Cat cow with leaning on counter  Discussed with patient on going to the next size dilator and how it may not fit all the way due to the increased in diameter and length. Letting her now her vaginal canal is shorted due to her radiation.  Strengthening: Therapist finger in the vaginal canal working on the muscles for contraction and tactile cues to the lower abdomen to engage them     03/23/23 Manual: Soft tissue mobilization: Manual work to the hip adductors and urogenital diaphragm to improve tissue mobility.  Internal pelvic floor techniques: No emotional/communication barriers or cognitive limitation. Patient is motivated to learn. Patient understands and agrees with treatment goals and plan. PT explains patient will be examined in standing, sitting, and lying down to see how their muscles and joints work. When they are ready, they will be asked to remove their underwear so PT can examine their perineum. The patient is also given the option of providing their own chaperone as one is not provided in our facility. The patient also has the right and is explained the right to defer or refuse any part of the evaluation or treatment including the internal exam. With the patient's consent, PT will use one gloved finger to gently assess the muscles of the pelvic floor, seeing how well it contracts and relaxes and if there is muscle symmetry. After, the patient will get dressed and PT and patient will discuss exam findings and plan of care. PT and patient discuss plan of care, schedule, attendance policy and HEP activities.  Using therapist index finger going through the vaginal opening working on the levator ani, obturator internist, posterior vaginal canal and sides of the bladder to release the tissue and improve the mobility Exercises: Stretches/mobility: Hamstring stretch in supine with strap holding 30 sec bil.  Supine hip adductor stretch with strap holding 30 sec bil.  Supine lateral hip stretch with strap holding 30 sec bil Supine hip flexor stretch with the therapist massaging the anterior hips Discuss with patient on the dilators  Strengthening: Therapeutic activities: Functional strengthening activities: Educated patient about the ohnut to use due to the decreased length of her vaginal canal.     PATIENT EDUCATION: 03/30/23 Education details: Access Code:  [email or web address] Person educated: Patient Education method: Explanation, Demonstration, Actor cues, Verbal cues, and Handouts Education comprehension: verbalized understanding, returned demonstration, verbal cues required, tactile cues required, and needs further education    HOME EXERCISE PROGRAM: 03/30/23 Access Code: [email or web address] URL: [email or web address] Date: 03/30/2023 Prepared by: Eulis Foster  Exercises - Seated Piriformis Stretch with Trunk Bend  - 1 x daily - 7 x weekly - 1 sets - 1 reps - 30 sec hold - Seated Hamstring Stretch  - 1 x daily - 7 x weekly - 1 sets - 1 reps - 30 sec hold - Seated Hip Adductor Stretch  - 1 x daily - 7 x weekly - 1 sets - 1 reps - 30 sec hold - Doorway Pec Stretch at 120 Degrees Abduction  - 1 x daily - 7 x weekly - 1 sets - 1 reps - 30 sec hold   ASSESSMENT:  CLINICAL IMPRESSION: Patient is a 48 y.o. female who was seen today for physical therapy  treatment for pelvic pain. Pelvic floor strength is 3/5. She will be trying the next dilator size and knows it wile tighter. She has tightness in her hips and lumbar spine. Her vaginal tissue feels  like it has more mobility in the beginning of the canal compared to the end.   Patient will benefit from skilled therapy to improve lengthening of the vaginal tissue to reduce pain after penetration and improve strength to reduce leakage.   OBJECTIVE IMPAIRMENTS: decreased coordination, decreased strength, increased fascial restrictions, increased muscle spasms, and pain.   ACTIVITY LIMITATIONS: continence  PARTICIPATION LIMITATIONS: interpersonal relationship and community activity  PERSONAL FACTORS: 3+ comorbidities: hx of hypertension, preeclampsia, hyperlipidemia, OSA, diabetes, squamous cell carcinoma of cervix s/p radiation and chemotherapy, morbid obesity  are also affecting patient's functional outcome.   REHAB POTENTIAL: Excellent  CLINICAL DECISION MAKING: Evolving/moderate  complexity  EVALUATION COMPLEXITY: Moderate   GOALS: Goals reviewed with patient? Yes  SHORT TERM GOALS: Target date: 04/13/23  Patient independent with initial flexibility HEP.  Baseline: not educated yet Goal status: Met 03/30/23   LONG TERM GOALS: Target date: 09/13/23  Patient independent with advanced HEP for core and pelvic floor strength.  Baseline: not educated yet Goal status: INITIAL  2.  Patient understands ways to manage pelvic pain with penetration with the Ohnut, breathing techniques, warming techniques, and ways to relax the tissue.  Baseline: not educated yet.  Goal status: INITIAL  3.  Pelvic floor strength is >/= 3/5 to reduce urinary leakage >/= 50% with coughing and sneezing.  Baseline: Pelvic strength 2/5 Goal status: INITIAL  4.  Patient is able to have vaginal penetration with pain afterwards reduced >/= 4/10 due to lengthening of the tissue.  Baseline: pain level 7/10 Goal status: INITIAL  5.  PLAN:  PT FREQUENCY: 1-2x/week  PT DURATION: 6 months  PLANNED INTERVENTIONS: 97110-Therapeutic exercises, 97530- Therapeutic activity, 97112- Neuromuscular re-education, [id number]- Manual therapy, Patient/Family education, Dry Needling, Spinal mobilization, and Biofeedback  PLAN FOR NEXT SESSION:diaphragmatic breathing, hip stretches for the inner thigh, manual work to the pelvic floor, core strength   Eulis Foster, PT 03/30/23 1:59 PM

## 2023-04-06 ENCOUNTER — Encounter: Payer: Medicaid Other | Attending: Radiation Oncology | Admitting: Physical Therapy

## 2023-04-06 ENCOUNTER — Encounter: Payer: Self-pay | Admitting: Physical Therapy

## 2023-04-06 DIAGNOSIS — M6281 Muscle weakness (generalized): Secondary | ICD-10-CM | POA: Diagnosis present

## 2023-04-06 DIAGNOSIS — R102 Pelvic and perineal pain: Secondary | ICD-10-CM | POA: Insufficient documentation

## 2023-04-06 NOTE — Therapy (Signed)
 OUTPATIENT PHYSICAL THERAPY FEMALE PELVIC TREATMENT   Patient Name: Tamara Osborne MRN: [id number] DOB:[birth year], 48 y.o., female Today's Date: 04/06/2023  END OF SESSION:  PT End of Session - 04/06/23 1303     Visit Number 4    Date for PT Re-Evaluation 09/13/23    Authorization Type [employer]    Authorization Time Period 03/16/2023 - 06/13/2023    Authorization - Visit Number 3    Authorization - Number of Visits 11    PT Start Time 1300    PT Stop Time 1355    PT Time Calculation (min) 55 min    Activity Tolerance Patient tolerated treatment well    Behavior During Therapy WFL for tasks assessed/performed             Past Medical History:  Diagnosis Date   Anemia    Angio-edema    Anxiety    Bartholin cyst    Cervical cancer (HCC)    Chronic kidney disease    Constipation    Depression    Deviated septum    Diabetes mellitus without complication (HCC)    Difficult intravenous access    used port for 05-09-2019 surgery   Fibroids    History of blood transfusion    2 units given 04-26-2019, has had total of 8 units since jan 2021   History of blood transfusion 1 unit given 05-30-19   iv fluids also given   History of cervical cancer 07/16/2022   History of kidney problems    History of radiation therapy 03/23/2019-05/04/2019   Cervical external beam   Dr Lynwood Nasuti   History of radiation therapy 05/09/2019-06/05/2019   vaginal brachytherapy   Dr Lynwood Nasuti   History of recent blood transfusion 05/16/2019   2 units given  [facility] dr melrose [facility]   Hypertension    Neuropathy    both thumbs   Obesity    Palpitations    PONV (postoperative nausea and vomiting)    Prediabetes    Preeclampsia 2006   Sleep apnea    no cpap used insurance would not cover, osa severe per pt   SOB (shortness of breath)    Squamous cell carcinoma of cervix (HCC) 03/23/2019   Swallowing difficulty    Symptomatic skin lesion 07/14/2022   Vitamin D  deficiency 01/10/2020    Past Surgical History:  Procedure Laterality Date   CESAREAN SECTION WITH BILATERAL TUBAL LIGATION  11/10/2004       DILATION AND CURETTAGE OF UTERUS  1997   IR IMAGING GUIDED PORT INSERTION  04/04/2019   IR REMOVAL TUN ACCESS W/ PORT W/O FL MOD SED  10/27/2021   OPERATIVE ULTRASOUND N/A 05/09/2019   Procedure: OPERATIVE ULTRASOUND;  Surgeon: Nasuti Lynwood, MD;  Location: [facility];  Service: Urology;  Laterality: N/A;   OPERATIVE ULTRASOUND N/A 05/15/2019   Procedure: OPERATIVE ULTRASOUND;  Surgeon: Nasuti Lynwood, MD;  Location: [facility];  Service: Urology;  Laterality: N/A;   OPERATIVE ULTRASOUND N/A 05/22/2019   Procedure: OPERATIVE ULTRASOUND;  Surgeon: Nasuti Lynwood, MD;  Location: [facility];  Service: Urology;  Laterality: N/A;   OPERATIVE ULTRASOUND N/A 05/29/2019   Procedure: OPERATIVE ULTRASOUND;  Surgeon: Nasuti Lynwood, MD;  Location: [facility];  Service: Urology;  Laterality: N/A;   OPERATIVE ULTRASOUND N/A 06/05/2019   Procedure: OPERATIVE ULTRASOUND;  Surgeon: Nasuti Lynwood, MD;  Location: [facility];  Service: Urology;  Laterality: N/A;   TANDEM  RING INSERTION N/A 05/09/2019   Procedure: TANDEM RING INSERTION;  Surgeon: Shannon Agent, MD;  Location: [facility];  Service: Urology;  Laterality: N/A;   TANDEM RING INSERTION N/A 05/15/2019   Procedure: TANDEM RING INSERTION;  Surgeon: Shannon Agent, MD;  Location: [facility];  Service: Urology;  Laterality: N/A;   TANDEM RING INSERTION N/A 05/22/2019   Procedure: TANDEM RING INSERTION;  Surgeon: Shannon Agent, MD;  Location: [facility];  Service: Urology;  Laterality: N/A;   TANDEM RING INSERTION N/A 05/29/2019   Procedure: TANDEM RING INSERTION;  Surgeon: Shannon Agent, MD;  Location: [facility];  Service: Urology;  Laterality: N/A;   TANDEM RING INSERTION N/A 06/05/2019   Procedure: TANDEM RING  INSERTION;  Surgeon: Shannon Agent, MD;  Location: [facility];  Service: Urology;  Laterality: N/A;   Patient Active Problem List   Diagnosis Date Noted   HSV-2 infection 07/13/2022   Dyspareunia in female 10/23/2021   Hyperlipidemia associated with type 2 diabetes mellitus (HCC) 07/03/2021   OSA (obstructive sleep apnea) 07/31/2020   Other hyperlipidemia 01/10/2020   Insulin  resistance 01/10/2020   Class 2 severe obesity with serious comorbidity and body mass index (BMI) of 39.0 to 39.9 in adult [facility]) 01/10/2020   Peripheral 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) 04/25/2019   CKD (chronic kidney disease), symptom management only, stage 3 (moderate) (HCC) 03/30/2019   Generalized anxiety disorder 03/30/2019   Hypertension associated with type 2 diabetes mellitus (HCC)    Squamous cell carcinoma of cervix (HCC) 03/23/2019   Deficiency anemia 03/22/2019    PCP: Towana Small, FNP  REFERRING PROVIDER:  C53.9 (ICD-10-CM) - Squamous cell carcinoma of cervix (HCC)   REFERRING DIAG: Shannon Agent, MD   THERAPY DIAG:  Muscle weakness (generalized)  Pelvic pain  Rationale for Evaluation and Treatment: Rehabilitation  ONSET DATE: 11/2022   SUBJECTIVE:                                                                                                                                                                                           SUBJECTIVE STATEMENT: I used the larger dilator.  Everything is going well. I talked to my boyfriend about the Ohnut and will order in the future. Only leak if in the car for an extended period of time, sneeze or cough.  Fluid intake: Yes: water    PAIN:  Are you having pain? Yes NPRS scale: 7/10 Pain location: Vaginal  Pain type: sharp and soreness that can extend for several days  Pain description: intermittent   Aggravating factors: vaginal penetration Relieving factors: time  PRECAUTIONS: Other: cancer with radiation and  chemotherapy  RED FLAGS: None   WEIGHT BEARING RESTRICTIONS: No  FALLS:  Has patient fallen in last 6 months? 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    BOWEL MOVEMENT: no issues   URINATION: Pain with urination: No Fully empty bladder: Yes:   Stream: Strong Urgency: Yes:   Frequency: every 3 hours, does not wake up during the night Leakage: Coughing and Sneezing Pads: Yes: when out of home  INTERCOURSE: Pain with intercourse: After Intercourse Ability to have vaginal penetration:  Yes:   [place]: not able to [employer] Scale: 1/3  PREGNANCY: Vaginal deliveries 1 Tearing Yes:    PROLAPSE: None   OBJECTIVE:  Note: Objective measures were completed at Evaluation unless otherwise noted.   PATIENT SURVEYS:  POPIQ-7 38 PFIQ-7 38  COGNITION: Overall cognitive status: Within functional limits for tasks assessed     SENSATION: Light touch: Appears intact Proprioception: Appears intact    POSTURE: rounded shoulders, forward head, and decreased lumbar lordosis  PELVIC ALIGNMENT:correct alignment  LUMBARAROM/PROM: lumbar ROM decreased by 50%   LOWER EXTREMITY MNF:qloo ROM in bilateral hips.  Tightness in the hip adductors.     LOWER EXTREMITY MMT: bilateral hip strength is 5/5   PALPATION:   General  Difficulty contracting abdominals and performing diaphragmatic breathing.                 External Perineal Exam tightness in the urogenital diaphragm, tenderness located in the levator ani and perineal body                             Internal Pelvic Floor tightness throughout the pelvic floor muscles, her vaginal canal is shortened  Patient confirms  identification and approves PT to assess internal pelvic floor and treatment Yes  PELVIC MMT:   MMT eval 03/30/23 04/06/23  Vaginal 2/5 3/5 3/5 10 sec  (Blank rows = not tested)        TONE: increased    TODAY'S TREATMENT:   04/06/23 Manual: Internal pelvic floor techniques: No emotional/communication barriers or cognitive limitation. Patient is motivated to learn. Patient understands and agrees with treatment goals and plan. PT explains patient will be examined in standing, sitting, and lying down to see how their muscles and joints work. When they are ready, they will be asked to remove their underwear so PT can examine their perineum. The patient is also given the option of providing their own chaperone as one is not provided in our facility. The patient also has the right and is explained the right to defer or refuse any part of the evaluation or treatment including the internal exam. With the patient's consent, PT will use one gloved finger to gently assess the muscles of the pelvic floor, seeing how well it contracts and relaxes and if there is muscle symmetry. After, the patient will get dressed and PT and patient will discuss exam findings and plan of care. PT and patient discuss plan of care, schedule, attendance policy and HEP activities.  Therapist finger in the vaginal canal working on lengthening of the canal, working around the bladder, manual work to the [place] tool works and obturator  internist, perineal body, and urogenital diaphragm Exercises: Stretches/mobility: Hip adductor stretch in standing Hip circles in standing Calf stretch against the wall Standing bending forward Standing stretching the side Stand facing the wall and stretch arms overhead and let hips hang to the wall Strengthening: Plank on wall and move arms outward Plank on wall and lift opposite arm and leg Supine pelvic floor contraction with therapist finger in the vaginal canal to feel the full contraction and  relaxation Self-care: Discussed with patient on using the smaller dilator first for 2 minutes then use the larger dilator to continue to work on expansion of the vaginal canal    03/30/23 Manual: Internal pelvic floor techniques: No emotional/communication barriers or cognitive limitation. Patient is motivated to learn. Patient understands and agrees with treatment goals and plan. PT explains patient will be examined in standing, sitting, and lying down to see how their muscles and joints work. When they are ready, they will be asked to remove their underwear so PT can examine their perineum. The patient is also given the option of providing their own chaperone as one is not provided in our facility. The patient also has the right and is explained the right to defer or refuse any part of the evaluation or treatment including the internal exam. With the patient's consent, PT will use one gloved finger to gently assess the muscles of the pelvic floor, seeing how well it contracts and relaxes and if there is muscle symmetry. After, the patient will get dressed and PT and patient will discuss exam findings and plan of care. PT and patient discuss plan of care, schedule, attendance policy and HEP activities.  Therapist finger in the vaginal canal working on lengthening of the canal, working around the bladder, manual work to the [place] tool works and obturator internist Exercises: Stretches/mobility: Piriformis stretch holding 30 sec bil. In sitting Sitting hamstring stretch holding 30 sec bil.  Sitting hip adductor stretch holding 30 sec each Hip flexor stretch with doorway stretch 30 sec bil.  Cat cow with leaning on counter  Discussed with patient on going to the next size dilator and how it may not fit all the way due to the increased in diameter and length. Letting her now her vaginal canal is shorted due to her radiation.  Strengthening: Therapist finger in the vaginal canal working on the muscles for  contraction and tactile cues to the lower abdomen to engage them    03/23/23 Manual: Soft tissue mobilization: Manual work to the hip adductors and urogenital diaphragm to improve tissue mobility.  Internal pelvic floor techniques: No emotional/communication barriers or cognitive limitation. Patient is motivated to learn. Patient understands and agrees with treatment goals and plan. PT explains patient will be examined in standing, sitting, and lying down to see how their muscles and joints work. When they are ready, they will be asked to remove their underwear so PT can examine their perineum. The patient is also given the option of providing their own chaperone as one is not provided in our facility. The patient also has the right and is explained the right to defer or refuse any part of the evaluation or treatment including the internal exam. With the patient's consent, PT will use one gloved finger to gently assess the muscles of the pelvic floor, seeing how well it contracts and relaxes and if there is muscle symmetry. After, the patient will get dressed and PT and patient will discuss exam findings and plan of care. PT and patient  discuss plan of care, schedule, attendance policy and HEP activities.  Using therapist index finger going through the vaginal opening working on the levator ani, obturator internist, posterior vaginal canal and sides of the bladder to release the tissue and improve the mobility Exercises: Stretches/mobility: Hamstring stretch in supine with strap holding 30 sec bil.  Supine hip adductor stretch with strap holding 30 sec bil.  Supine lateral hip stretch with strap holding 30 sec bil Supine hip flexor stretch with the therapist massaging the anterior hips Discuss with patient on the dilators  Strengthening: Therapeutic activities: Functional strengthening activities: Educated patient about the ohnut to use due to the decreased length of her vaginal canal.      PATIENT EDUCATION: 03/30/23 Education details: Access Code: [email or web address] Person educated: Patient Education method: Explanation, Demonstration, Actor cues, Verbal cues, and Handouts Education comprehension: verbalized understanding, returned demonstration, verbal cues required, tactile cues required, and needs further education    HOME EXERCISE PROGRAM: 03/30/23 Access Code: [email or web address] URL: [email or web address] Date: 03/30/2023 Prepared by: Channing Pereyra  Exercises - Seated Piriformis Stretch with Trunk Bend  - 1 x daily - 7 x weekly - 1 sets - 1 reps - 30 sec hold - Seated Hamstring Stretch  - 1 x daily - 7 x weekly - 1 sets - 1 reps - 30 sec hold - Seated Hip Adductor Stretch  - 1 x daily - 7 x weekly - 1 sets - 1 reps - 30 sec hold - Doorway Pec Stretch at 120 Degrees Abduction  - 1 x daily - 7 x weekly - 1 sets - 1 reps - 30 sec hold   ASSESSMENT:  CLINICAL IMPRESSION: Patient is a 48 y.o. female who was seen today for physical therapy  treatment for pelvic pain. Pelvic floor strength is 3/5. She can feel the pelvic floor contract now. She tried the  next dilator size and had less discomfort. . She has tightness in her hips and lumbar spine. Her vaginal tissue feels like it has more mobility in the beginning of the canal compared to the end.   She is starting to work on her core now to assist with pelvic floor strength. She continues to leak urine but as her strength inreases and improved endurance this should change. Patient will benefit from skilled therapy to improve lengthening of the vaginal tissue to reduce pain after penetration and improve strength to reduce leakage.   OBJECTIVE IMPAIRMENTS: decreased coordination, decreased strength, increased fascial restrictions, increased muscle spasms, and pain.   ACTIVITY LIMITATIONS: continence  PARTICIPATION LIMITATIONS: interpersonal relationship and community activity  PERSONAL FACTORS: 3+ comorbidities: hx of  hypertension, preeclampsia, hyperlipidemia, OSA, diabetes, squamous cell carcinoma of cervix s/p radiation and chemotherapy, morbid obesity  are also affecting patient's functional outcome.   REHAB POTENTIAL: Excellent  CLINICAL DECISION MAKING: Evolving/moderate complexity  EVALUATION COMPLEXITY: Moderate   GOALS: Goals reviewed with patient? Yes  SHORT TERM GOALS: Target date: 04/13/23  Patient independent with initial flexibility HEP.  Baseline: not educated yet Goal status: Met 03/30/23   LONG TERM GOALS: Target date: 09/13/23  Patient independent with advanced HEP for core and pelvic floor strength.  Baseline: not educated yet Goal status: INITIAL  2.  Patient understands ways to manage pelvic pain with penetration with the Ohnut, breathing techniques, warming techniques, and ways to relax the tissue.  Baseline: not educated yet.  Goal status: ongoing 04/06/23  3.  Pelvic floor strength is >/= 3/5 to reduce urinary leakage >/= 50% with coughing and  sneezing.  Baseline: Pelvic strength 2/5 Goal status: INITIAL  4.  Patient is able to have vaginal penetration with pain afterwards reduced >/= 4/10 due to lengthening of the tissue.  Baseline: pain level 7/10 Goal status: INITIAL  5.  PLAN:  PT FREQUENCY: 1-2x/week  PT DURATION: 6 months  PLANNED INTERVENTIONS: 97110-Therapeutic exercises, 97530- Therapeutic activity, 97112- Neuromuscular re-education, [id number]- Manual therapy, Patient/Family education, Dry Needling, Spinal mobilization, and Biofeedback  PLAN FOR NEXT SESSION: hip stretches for the inner thigh, manual work to the pelvic floor, core strength, see how it is going with the dilator   Channing Pereyra, PT 04/06/23 2:26 PM

## 2023-04-08 ENCOUNTER — Other Ambulatory Visit (INDEPENDENT_AMBULATORY_CARE_PROVIDER_SITE_OTHER): Payer: Self-pay | Admitting: Adult Health

## 2023-04-08 DIAGNOSIS — E1122 Type 2 diabetes mellitus with diabetic chronic kidney disease: Secondary | ICD-10-CM

## 2023-04-13 ENCOUNTER — Encounter: Payer: Medicaid Other | Admitting: Physical Therapy

## 2023-04-14 ENCOUNTER — Encounter: Payer: Self-pay | Admitting: Physical Therapy

## 2023-04-14 ENCOUNTER — Ambulatory Visit: Payer: Medicaid Other | Attending: Radiation Oncology | Admitting: Physical Therapy

## 2023-04-14 DIAGNOSIS — R102 Pelvic and perineal pain: Secondary | ICD-10-CM | POA: Insufficient documentation

## 2023-04-14 DIAGNOSIS — M6281 Muscle weakness (generalized): Secondary | ICD-10-CM | POA: Diagnosis present

## 2023-04-14 NOTE — Therapy (Signed)
OUTPATIENT PHYSICAL THERAPY FEMALE PELVIC TREATMENT   Patient Name: Tamara Osborne MRN: [id number] DOB:[birth year], 48 y.o., female Today's Date: 04/14/2023  END OF SESSION:  PT End of Session - 04/14/23 1453     Visit Number 5    Date for PT Re-Evaluation 09/13/23    Authorization Type [employer]    Authorization Time Period 03/16/2023 - 06/13/2023    Authorization - Visit Number 4    Authorization - Number of Visits 11    PT Start Time 1445    PT Stop Time 1525    PT Time Calculation (min) 40 min    Activity Tolerance Patient tolerated treatment well    Behavior During Therapy WFL for tasks assessed/performed             Past Medical History:  Diagnosis Date   Anemia    Angio-edema    Anxiety    Bartholin cyst    Cervical cancer (HCC)    Chronic kidney disease    Constipation    Depression    Deviated septum    Diabetes mellitus without complication (HCC)    Difficult intravenous access    used port for 05-09-2019 surgery   Fibroids    History of blood transfusion    2 units given 04-26-2019, has had total of 8 units since jan 2021   History of blood transfusion 1 unit given 05-30-19   iv fluids also given   History of cervical cancer 07/16/2022   History of kidney problems    History of radiation therapy 03/23/2019-05/04/2019   Cervical external beam   Dr Antony Blackbird   History of radiation therapy 05/09/2019-06/05/2019   vaginal brachytherapy   Dr Antony Blackbird   History of recent blood transfusion 05/16/2019   2 units given  per dr Lambert Mody at cancer center   Hypertension    Neuropathy    both thumbs   Obesity    Palpitations    PONV (postoperative nausea and vomiting)    Prediabetes    Preeclampsia 2006   Sleep apnea    no cpap used insurance would not cover, osa severe per pt   SOB (shortness of breath)    Squamous cell carcinoma of cervix (HCC) 03/23/2019   Swallowing difficulty    Symptomatic skin lesion 07/14/2022   Vitamin D deficiency 01/10/2020    Past Surgical History:  Procedure Laterality Date   CESAREAN SECTION WITH BILATERAL TUBAL LIGATION  11/10/2004       DILATION AND CURETTAGE OF UTERUS  1997   IR IMAGING GUIDED PORT INSERTION  04/04/2019   IR REMOVAL TUN ACCESS W/ PORT W/O FL MOD SED  10/27/2021   OPERATIVE ULTRASOUND N/A 05/09/2019   Procedure: OPERATIVE ULTRASOUND;  Surgeon: Antony Blackbird, MD;  Location: [facility];  Service: Urology;  Laterality: N/A;   OPERATIVE ULTRASOUND N/A 05/15/2019   Procedure: OPERATIVE ULTRASOUND;  Surgeon: Antony Blackbird, MD;  Location: [facility];  Service: Urology;  Laterality: N/A;   OPERATIVE ULTRASOUND N/A 05/22/2019   Procedure: OPERATIVE ULTRASOUND;  Surgeon: Antony Blackbird, MD;  Location: [facility];  Service: Urology;  Laterality: N/A;   OPERATIVE ULTRASOUND N/A 05/29/2019   Procedure: OPERATIVE ULTRASOUND;  Surgeon: Antony Blackbird, MD;  Location: [facility];  Service: Urology;  Laterality: N/A;   OPERATIVE ULTRASOUND N/A 06/05/2019   Procedure: OPERATIVE ULTRASOUND;  Surgeon: Antony Blackbird, MD;  Location: [facility];  Service: Urology;  Laterality: N/A;   TANDEM  RING INSERTION N/A 05/09/2019   Procedure: TANDEM RING INSERTION;  Surgeon: Antony Blackbird, MD;  Location: [facility];  Service: Urology;  Laterality: N/A;   TANDEM RING INSERTION N/A 05/15/2019   Procedure: TANDEM RING INSERTION;  Surgeon: Antony Blackbird, MD;  Location: [facility];  Service: Urology;  Laterality: N/A;   TANDEM RING INSERTION N/A 05/22/2019   Procedure: TANDEM RING INSERTION;  Surgeon: Antony Blackbird, MD;  Location: [facility];  Service: Urology;  Laterality: N/A;   TANDEM RING INSERTION N/A 05/29/2019   Procedure: TANDEM RING INSERTION;  Surgeon: Antony Blackbird, MD;  Location: [facility];  Service: Urology;  Laterality: N/A;   TANDEM RING INSERTION N/A 06/05/2019   Procedure: TANDEM RING  INSERTION;  Surgeon: Antony Blackbird, MD;  Location: [facility];  Service: Urology;  Laterality: N/A;   Patient Active Problem List   Diagnosis Date Noted   HSV-2 infection 07/13/2022   Dyspareunia in female 10/23/2021   Hyperlipidemia associated with type 2 diabetes mellitus (HCC) 07/03/2021   OSA (obstructive sleep apnea) 07/31/2020   Other hyperlipidemia 01/10/2020   Insulin resistance 01/10/2020   Class 2 severe obesity with serious comorbidity and body mass index (BMI) of 39.0 to 39.9 in adult [facility]) 01/10/2020   Peripheral 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) 04/25/2019   CKD (chronic kidney disease), symptom management only, stage 3 (moderate) (HCC) 03/30/2019   Generalized anxiety disorder 03/30/2019   Hypertension associated with type 2 diabetes mellitus (HCC)    Squamous cell carcinoma of cervix (HCC) 03/23/2019   Deficiency anemia 03/22/2019    PCP: Alyson Reedy, FNP  REFERRING PROVIDER:  C53.9 (ICD-10-CM) - Squamous cell carcinoma of cervix (HCC)   REFERRING DIAG: Antony Blackbird, MD   THERAPY DIAG:  Muscle weakness (generalized)  Pelvic pain  Rationale for Evaluation and Treatment: Rehabilitation  ONSET DATE: 11/2022   SUBJECTIVE:                                                                                                                                                                                           SUBJECTIVE STATEMENT: The dilator went in better but not as far as the one below. Each time I use it I feel like there is an improvement. My legs feel better.  Fluid intake: Yes: water    PAIN:  Are you having pain? Yes NPRS scale: 7/10 Pain location: Vaginal  Pain type: sharp and soreness that can extend for several days  Pain description: intermittent   Aggravating factors: vaginal penetration Relieving factors: time  PRECAUTIONS: Other: cancer with radiation and chemotherapy  RED FLAGS:  None   WEIGHT BEARING  RESTRICTIONS: No  FALLS:  Has patient fallen in last 6 months? 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    BOWEL MOVEMENT: no issues   URINATION: Pain with urination: No Fully empty bladder: Yes:   Stream: Strong Urgency: Yes:   Frequency: every 3 hours, does not wake up during the night Leakage: Coughing and Sneezing Pads: Yes: when out of home  INTERCOURSE: Pain with intercourse: After Intercourse Ability to have vaginal penetration:  Yes:   [place]ax: not able to [employer] Scale: 1/3  PREGNANCY: Vaginal deliveries 1 Tearing Yes:    PROLAPSE: None   OBJECTIVE:  Note: Objective measures were completed at Evaluation unless otherwise noted.   PATIENT SURVEYS:  POPIQ-7 38 PFIQ-7 38  COGNITION: Overall cognitive status: Within functional limits for tasks assessed     SENSATION: Light touch: Appears intact Proprioception: Appears intact    POSTURE: rounded shoulders, forward head, and decreased lumbar lordosis  PELVIC ALIGNMENT:correct alignment  LUMBARAROM/PROM: lumbar ROM decreased by 50%   LOWER EXTREMITY ZOX:WRUE ROM in bilateral hips.  Tightness in the hip adductors.     LOWER EXTREMITY MMT: bilateral hip strength is 5/5   PALPATION:   General  Difficulty contracting abdominals and performing diaphragmatic breathing.                 External Perineal Exam tightness in the urogenital diaphragm, tenderness located in the levator ani and perineal body                             Internal Pelvic Floor tightness throughout the pelvic floor muscles, her vaginal canal is shortened  Patient confirms identification and approves PT to assess internal pelvic  floor and treatment Yes  PELVIC MMT:   MMT eval 03/30/23 04/06/23 04/14/23  Vaginal 2/5 3/5 3/5 10 sec 3/5  (Blank rows = not tested)        TONE: increased    TODAY'S TREATMENT:   04/14/23 Manual: Internal pelvic floor techniques: No emotional/communication barriers or cognitive limitation. Patient is motivated to learn. Patient understands and agrees with treatment goals and plan. PT explains patient will be examined in standing, sitting, and lying down to see how their muscles and joints work. When they are ready, they will be asked to remove their underwear so PT can examine their perineum. The patient is also given the option of providing their own chaperone as one is not provided in our facility. The patient also has the right and is explained the right to defer or refuse any part of the evaluation or treatment including the internal exam. With the patient's consent, PT will use one gloved finger to gently assess the muscles of the pelvic floor, seeing how well it contracts and relaxes and if there is muscle symmetry. After, the patient will get dressed and PT and patient will discuss exam findings and plan of care. PT and patient discuss plan of care, schedule, attendance policy and HEP activities.  Therapist finger in the vaginal canal working on lengthening of the canal, working around the bladder, manual work to the [employer] and obturator internist, perineal body, and urogenital diaphragm Exercises: Stretches/mobility:to  increase the mobility of the pelvic floor muscles to prepare for manual work Sitting with legs apart and therapist gives pressure on her back to increase muscle length Prone press up to elongate the anterior trunk Prone hip flexor stretch  Prone hip internal rotation Quadruped with hips internally rotated and rock back and forth Strengthening: Nustep level 5  for 8 minutes while assessing patient    04/06/23 Manual: Internal pelvic floor techniques: No  emotional/communication barriers or cognitive limitation. Patient is motivated to learn. Patient understands and agrees with treatment goals and plan. PT explains patient will be examined in standing, sitting, and lying down to see how their muscles and joints work. When they are ready, they will be asked to remove their underwear so PT can examine their perineum. The patient is also given the option of providing their own chaperone as one is not provided in our facility. The patient also has the right and is explained the right to defer or refuse any part of the evaluation or treatment including the internal exam. With the patient's consent, PT will use one gloved finger to gently assess the muscles of the pelvic floor, seeing how well it contracts and relaxes and if there is muscle symmetry. After, the patient will get dressed and PT and patient will discuss exam findings and plan of care. PT and patient discuss plan of care, schedule, attendance policy and HEP activities.  Therapist finger in the vaginal canal working on lengthening of the canal, working around the bladder, manual work to the [employer] and obturator internist, perineal body, and urogenital diaphragm Exercises: Stretches/mobility: Hip adductor stretch in standing Hip circles in standing Calf stretch against the wall Standing bending forward Standing stretching the side Stand facing the wall and stretch arms overhead and let hips hang to the wall Strengthening: Plank on wall and move arms outward Plank on wall and lift opposite arm and leg Supine pelvic floor contraction with therapist finger in the vaginal canal to feel the full contraction and relaxation Self-care: Discussed with patient on using the smaller dilator first for 2 minutes then use the larger dilator to continue to work on expansion of the vaginal canal    03/30/23 Manual: Internal pelvic floor techniques: No emotional/communication barriers or cognitive  limitation. Patient is motivated to learn. Patient understands and agrees with treatment goals and plan. PT explains patient will be examined in standing, sitting, and lying down to see how their muscles and joints work. When they are ready, they will be asked to remove their underwear so PT can examine their perineum. The patient is also given the option of providing their own chaperone as one is not provided in our facility. The patient also has the right and is explained the right to defer or refuse any part of the evaluation or treatment including the internal exam. With the patient's consent, PT will use one gloved finger to gently assess the muscles of the pelvic floor, seeing how well it contracts and relaxes and if there is muscle symmetry. After, the patient will get dressed and PT and patient will discuss exam findings and plan of care. PT and patient discuss plan of care, schedule, attendance policy and HEP activities.  Therapist finger in the vaginal canal working on lengthening of the canal, working around the bladder, manual work to the [employer] and obturator internist Exercises: Stretches/mobility: Piriformis stretch holding 30 sec bil. In sitting Sitting hamstring stretch holding 30 sec bil.  Sitting hip adductor stretch  holding 30 sec each Hip flexor stretch with doorway stretch 30 sec bil.  Cat cow with leaning on counter  Discussed with patient on going to the next size dilator and how it may not fit all the way due to the increased in diameter and length. Letting her now her vaginal canal is shorted due to her radiation.  Strengthening: Therapist finger in the vaginal canal working on the muscles for contraction and tactile cues to the lower abdomen to engage them       PATIENT EDUCATION: 03/30/23 Education details: Access Code: [email or web address] Person educated: Patient Education method: Explanation, Demonstration, Tactile cues, Verbal cues, and Handouts Education comprehension:  verbalized understanding, returned demonstration, verbal cues required, tactile cues required, and needs further education    HOME EXERCISE PROGRAM: 03/30/23 Access Code: [email or web address] URL: [email or web address] Date: 03/30/2023 Prepared by: Eulis Foster  Exercises - Seated Piriformis Stretch with Trunk Bend  - 1 x daily - 7 x weekly - 1 sets - 1 reps - 30 sec hold - Seated Hamstring Stretch  - 1 x daily - 7 x weekly - 1 sets - 1 reps - 30 sec hold - Seated Hip Adductor Stretch  - 1 x daily - 7 x weekly - 1 sets - 1 reps - 30 sec hold - Doorway Pec Stretch at 120 Degrees Abduction  - 1 x daily - 7 x weekly - 1 sets - 1 reps - 30 sec hold   ASSESSMENT:  CLINICAL IMPRESSION: Patient is a 48 y.o. female who was seen today for physical therapy  treatment for pelvic pain. Patient feels the dilator is getting easier each time I use it. She is building my endurance. Patient walks more fluid after stretching. Increased elasticity of the length of the vaginal canal after the manual. She feels the urinary leakage is getting better.  Patient will benefit from skilled therapy to improve lengthening of the vaginal tissue to reduce pain after penetration and improve strength to reduce leakage.   OBJECTIVE IMPAIRMENTS: decreased coordination, decreased strength, increased fascial restrictions, increased muscle spasms, and pain.   ACTIVITY LIMITATIONS: continence  PARTICIPATION LIMITATIONS: interpersonal relationship and community activity  PERSONAL FACTORS: 3+ comorbidities: hx of hypertension, preeclampsia, hyperlipidemia, OSA, diabetes, squamous cell carcinoma of cervix s/p radiation and chemotherapy, morbid obesity  are also affecting patient's functional outcome.   REHAB POTENTIAL: Excellent  CLINICAL DECISION MAKING: Evolving/moderate complexity  EVALUATION COMPLEXITY: Moderate   GOALS: Goals reviewed with patient? Yes  SHORT TERM GOALS: Target date: 04/13/23  Patient  independent with initial flexibility HEP.  Baseline: not educated yet Goal status: Met 03/30/23   LONG TERM GOALS: Target date: 09/13/23  Patient independent with advanced HEP for core and pelvic floor strength.  Baseline: not educated yet Goal status: INITIAL  2.  Patient understands ways to manage pelvic pain with penetration with the [place], breathing techniques, warming techniques, and ways to relax the tissue.  Baseline: not educated yet.  Goal status: ongoing 04/06/23  3.  Pelvic floor strength is >/= 3/5 to reduce urinary leakage >/= 50% with coughing and sneezing.  Baseline: Pelvic strength 2/5 Goal status: INITIAL  4.  Patient is able to have vaginal penetration with pain afterwards reduced >/= 4/10 due to lengthening of the tissue.  Baseline: pain level 7/10 Goal status: INITIAL  5.  PLAN:  PT FREQUENCY: 1-2x/week  PT DURATION: 6 months  PLANNED INTERVENTIONS: 97110-Therapeutic exercises, [id number]- Therapeutic activity, [id number]- Neuromuscular re-education, 97140- Manual therapy, Patient/Family education, Dry Needling, Spinal mobilization, and  Biofeedback  PLAN FOR NEXT SESSION: hip stretches for the inner thigh, manual work to the pelvic floor, core strength, go over ways to manage the pelvic pain with vaginal penetration   Eulis Foster, PT 04/14/23 3:27 PM

## 2023-04-20 ENCOUNTER — Encounter: Payer: Self-pay | Admitting: Physical Therapy

## 2023-04-20 ENCOUNTER — Encounter: Payer: Medicaid Other | Admitting: Physical Therapy

## 2023-04-20 DIAGNOSIS — M6281 Muscle weakness (generalized): Secondary | ICD-10-CM | POA: Diagnosis not present

## 2023-04-20 DIAGNOSIS — R102 Pelvic and perineal pain: Secondary | ICD-10-CM

## 2023-04-20 NOTE — Therapy (Signed)
OUTPATIENT PHYSICAL THERAPY FEMALE PELVIC TREATMENT   Patient Name: Tamara Osborne MRN: [id number] DOB:1975/09/09, 48 y.o., female Today's Date: 04/20/2023  END OF SESSION:  PT End of Session - 04/20/23 1259     Visit Number 6    Date for PT Re-Evaluation 09/13/23    Authorization Time Period 03/16/2023 - 06/13/2023    Authorization - Visit Number 5    Authorization - Number of Visits 11    PT Start Time 1259    PT Stop Time 1345    PT Time Calculation (min) 46 min    Activity Tolerance Patient tolerated treatment well    Behavior During Therapy WFL for tasks assessed/performed             Past Medical History:  Diagnosis Date   Anemia    Angio-edema    Anxiety    Bartholin cyst    Cervical cancer (HCC)    Chronic kidney disease    Constipation    Depression    Deviated septum    Diabetes mellitus without complication (HCC)    Difficult intravenous access    used port for 05-09-2019 surgery   Fibroids    History of blood transfusion    2 units given 04-26-2019, has had total of 8 units since jan 2021   History of blood transfusion 1 unit given 05-30-19   iv fluids also given   History of cervical cancer 07/16/2022   History of kidney problems    History of radiation therapy 03/23/2019-05/04/2019   Cervical external beam   Dr Antony Blackbird   History of radiation therapy 05/09/2019-06/05/2019   vaginal brachytherapy   Dr Antony Blackbird   History of recent blood transfusion 05/16/2019   2 units given  per dr Lambert Mody at cancer center   Hypertension    Neuropathy    both thumbs   Obesity    Palpitations    PONV (postoperative nausea and vomiting)    Prediabetes    Preeclampsia 2006   Sleep apnea    no cpap used insurance would not cover, osa severe per pt   SOB (shortness of breath)    Squamous cell carcinoma of cervix (HCC) 03/23/2019   Swallowing difficulty    Symptomatic skin lesion 07/14/2022   Vitamin D deficiency 01/10/2020   Past Surgical History:   Procedure Laterality Date   CESAREAN SECTION WITH BILATERAL TUBAL LIGATION  11/10/2004       DILATION AND CURETTAGE OF UTERUS  1997   IR IMAGING GUIDED PORT INSERTION  04/04/2019   IR REMOVAL TUN ACCESS W/ PORT W/O FL MOD SED  10/27/2021   OPERATIVE ULTRASOUND N/A 05/09/2019   Procedure: OPERATIVE ULTRASOUND;  Surgeon: Antony Blackbird, MD;  Location: [facility] [place];  Service: Urology;  Laterality: N/A;   OPERATIVE ULTRASOUND N/A 05/15/2019   Procedure: OPERATIVE ULTRASOUND;  Surgeon: Antony Blackbird, MD;  Location: [facility];  Service: Urology;  Laterality: N/A;   OPERATIVE ULTRASOUND N/A 05/22/2019   Procedure: OPERATIVE ULTRASOUND;  Surgeon: Antony Blackbird, MD;  Location: [facility];  Service: Urology;  Laterality: N/A;   OPERATIVE ULTRASOUND N/A 05/29/2019   Procedure: OPERATIVE ULTRASOUND;  Surgeon: Antony Blackbird, MD;  Location: [facility];  Service: Urology;  Laterality: N/A;   OPERATIVE ULTRASOUND N/A 06/05/2019   Procedure: OPERATIVE ULTRASOUND;  Surgeon: Antony Blackbird, MD;  Location: [facility];  Service: Urology;  Laterality: N/A;   TANDEM RING INSERTION N/A 05/09/2019   Procedure:  TANDEM RING INSERTION;  Surgeon: Antony Blackbird, MD;  Location: [facility];  Service: Urology;  Laterality: N/A;   TANDEM RING INSERTION N/A 05/15/2019   Procedure: TANDEM RING INSERTION;  Surgeon: Antony Blackbird, MD;  Location: [facility];  Service: Urology;  Laterality: N/A;   TANDEM RING INSERTION N/A 05/22/2019   Procedure: TANDEM RING INSERTION;  Surgeon: Antony Blackbird, MD;  Location: [facility];  Service: Urology;  Laterality: N/A;   TANDEM RING INSERTION N/A 05/29/2019   Procedure: TANDEM RING INSERTION;  Surgeon: Antony Blackbird, MD;  Location: [facility];  Service: Urology;  Laterality: N/A;   TANDEM RING INSERTION N/A 06/05/2019   Procedure: TANDEM RING INSERTION;  Surgeon:  Antony Blackbird, MD;  Location: [facility];  Service: Urology;  Laterality: N/A;   Patient Active Problem List   Diagnosis Date Noted   HSV-2 infection 07/13/2022   Dyspareunia in female 10/23/2021   Hyperlipidemia associated with type 2 diabetes mellitus (HCC) 07/03/2021   OSA (obstructive sleep apnea) 07/31/2020   Other hyperlipidemia 01/10/2020   Insulin resistance 01/10/2020   Class 2 severe obesity with serious comorbidity and body mass index (BMI) of 39.0 to 39.9 in adult [facility]) 01/10/2020   Peripheral 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) 04/25/2019   CKD (chronic kidney disease), symptom management only, stage 3 (moderate) (HCC) 03/30/2019   Generalized anxiety disorder 03/30/2019   Hypertension associated with type 2 diabetes mellitus (HCC)    Squamous cell carcinoma of cervix (HCC) 03/23/2019   Deficiency anemia 03/22/2019    PCP: Alyson Reedy, FNP  REFERRING PROVIDER:  C53.9 (ICD-10-CM) - Squamous cell carcinoma of cervix (HCC)   REFERRING DIAG: Antony Blackbird, MD   THERAPY DIAG:  Muscle weakness (generalized)  Pelvic pain  Rationale for Evaluation and Treatment: Rehabilitation  ONSET DATE: 11/2022   SUBJECTIVE:                                                                                                                                                                                           SUBJECTIVE STATEMENT: The dilator went in better but not as far as the one below. Each time I use it I feel like there is an improvement. My legs feel better.  Fluid intake: Yes: water    PAIN:  Are you having pain? Yes NPRS scale: 7/10 Pain location: Vaginal  Pain type: sharp and soreness that can extend for several days  Pain description: intermittent   Aggravating factors: vaginal penetration Relieving factors: time  PRECAUTIONS: Other: cancer with radiation and chemotherapy  RED FLAGS: None   WEIGHT BEARING RESTRICTIONS: No  FALLS:  Has  patient fallen in last 6 months? 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    BOWEL MOVEMENT: no issues   URINATION: Pain with urination: No Fully empty bladder: Yes:   Stream: Strong Urgency: Yes:   Frequency: every 3 hours, does not wake up during the night Leakage: Coughing and Sneezing Pads: Yes: when out of home  INTERCOURSE: Pain with intercourse: After Intercourse Ability to have vaginal penetration:  Yes:   Climax: not able to [employer] Scale: 1/3  PREGNANCY: Vaginal deliveries 1 Tearing Yes:    PROLAPSE: None   OBJECTIVE:  Note: Objective measures were completed at Evaluation unless otherwise noted.   PATIENT SURVEYS:  POPIQ-7 38 PFIQ-7 38  COGNITION: Overall cognitive status: Within functional limits for tasks assessed     SENSATION: Light touch: Appears intact Proprioception: Appears intact    POSTURE: rounded shoulders, forward head, and decreased lumbar lordosis  PELVIC ALIGNMENT:correct alignment  LUMBARAROM/PROM: lumbar ROM decreased by 50%   LOWER EXTREMITY ZOX:WRUE ROM in bilateral hips.  Tightness in the hip adductors.     LOWER EXTREMITY MMT: bilateral hip strength is 5/5   PALPATION:   General  Difficulty contracting abdominals and performing diaphragmatic breathing.                 External Perineal Exam tightness in the urogenital diaphragm, tenderness located in the levator ani and perineal body                             Internal Pelvic Floor tightness throughout the pelvic floor muscles, her vaginal canal is shortened  Patient confirms identification and approves PT to assess internal pelvic floor and treatment Yes  PELVIC  MMT:   MMT eval 03/30/23 04/06/23 04/14/23 04/20/23  Vaginal 2/5 3/5 3/5 10 sec 3/5 3/5  (Blank rows = not tested)        TONE: increased    TODAY'S TREATMENT:   04/20/23 Manual: Internal pelvic floor techniques: No emotional/communication barriers or cognitive limitation. Patient is motivated to learn. Patient understands and agrees with treatment goals and plan. PT explains patient will be examined in standing, sitting, and lying down to see how their muscles and joints work. When they are ready, they will be asked to remove their underwear so PT can examine their perineum. The patient is also given the option of providing their own chaperone as one is not provided in our facility. The patient also has the right and is explained the right to defer or refuse any part of the evaluation or treatment including the internal exam. With the patient's consent, PT will use one gloved finger to gently assess the muscles of the pelvic floor, seeing how well it contracts and relaxes and if there is muscle symmetry. After, the patient will get dressed and PT and patient will discuss exam findings and plan of care. PT and patient discuss plan of care, schedule, attendance policy and HEP activities.  Supine therapist finger in the vaginal canal working on the pelvic floor, working on elongating  the vaginal canal with hip movements Neuromuscular re-education: Core facilitation: Plank against the wall while moving hands to engage the core [employer]  against the wall and march to work the core [employer] push ups to work the core Standing hip rotation with core engaged and working on [employer] 12 times  Exercises: Stretches/mobility: Sit in chair and roll the ball forward and perform diaphragmatic breathing to open the back up and inner thigh area then roll the ball back and forth Sit in chair and hold ball over head and lean side to side and trunk rotation Doorway stretch to open up the anterior trunk Sitting hip  abduction and lean forward to stretch the inner thigh Butterfly stretch  Strengthening: Therapeutic activities: Functional strengthening activities: Discussed with patient on using the ohnut or be on top to adapt to the shorter vaginal canal. This will help with managing her pain with penetration.     04/14/23 Manual: Internal pelvic floor techniques: No emotional/communication barriers or cognitive limitation. Patient is motivated to learn. Patient understands and agrees with treatment goals and plan. PT explains patient will be examined in standing, sitting, and lying down to see how their muscles and joints work. When they are ready, they will be asked to remove their underwear so PT can examine their perineum. The patient is also given the option of providing their own chaperone as one is not provided in our facility. The patient also has the right and is explained the right to defer or refuse any part of the evaluation or treatment including the internal exam. With the patient's consent, PT will use one gloved finger to gently assess the muscles of the pelvic floor, seeing how well it contracts and relaxes and if there is muscle symmetry. After, the patient will get dressed and PT and patient will discuss exam findings and plan of care. PT and patient discuss plan of care, schedule, attendance policy and HEP activities.  Therapist finger in the vaginal canal working on lengthening of the canal, working around the bladder, manual work to the [employer] and obturator internist, perineal body, and urogenital diaphragm Exercises: Stretches/mobility:to increase the mobility of the pelvic floor muscles to prepare for manual work Sitting with legs apart and therapist gives pressure on her back to increase muscle length Prone press up to elongate the anterior trunk Prone hip flexor stretch  Prone hip internal rotation Quadruped with hips internally rotated and rock back and  forth Strengthening: Nustep level 5  for 8 minutes while assessing patient    04/06/23 Manual: Internal pelvic floor techniques: No emotional/communication barriers or cognitive limitation. Patient is motivated to learn. Patient understands and agrees with treatment goals and plan. PT explains patient will be examined in standing, sitting, and lying down to see how their muscles and joints work. When they are ready, they will be asked to remove their underwear so PT can examine their perineum. The patient is also given the option of providing their own chaperone as one is not provided in our facility. The patient also has the right and is explained the right to defer or refuse any part of the evaluation or treatment including the internal exam. With the patient's consent, PT will use one gloved finger to gently assess the muscles of the pelvic floor, seeing how well it contracts and relaxes and if there is muscle symmetry. After, the patient will get dressed and PT and patient will discuss exam findings and plan of care. PT and patient discuss plan of care, schedule, attendance policy and HEP activities.  Therapist finger in the vaginal canal working on lengthening of the canal, working around  the bladder, manual work to the levator ani and obturator internist, perineal body, and urogenital diaphragm Exercises: Stretches/mobility: Hip adductor stretch in standing Hip circles in standing Calf stretch against the wall Standing bending forward Standing stretching the side Stand facing the wall and stretch arms overhead and let hips hang to the wall Strengthening: Plank on wall and move arms outward Plank on wall and lift opposite arm and leg Supine pelvic floor contraction with therapist finger in the vaginal canal to feel the full contraction and relaxation Self-care: Discussed with patient on using the smaller dilator first for 2 minutes then use the larger dilator to continue to work on  expansion of the vaginal canal      PATIENT EDUCATION: 03/30/23 Education details: Access Code: [email or web address] Person educated: Patient Education method: Explanation, Demonstration, Tactile cues, Verbal cues, and Handouts Education comprehension: verbalized understanding, returned demonstration, verbal cues required, tactile cues required, and needs further education    HOME EXERCISE PROGRAM: 03/30/23 Access Code: [email or web address] URL: [email or web address] Date: 03/30/2023 Prepared by: Eulis Foster  Exercises - Seated Piriformis Stretch with Trunk Bend  - 1 x daily - 7 x weekly - 1 sets - 1 reps - 30 sec hold - Seated Hamstring Stretch  - 1 x daily - 7 x weekly - 1 sets - 1 reps - 30 sec hold - Seated Hip Adductor Stretch  - 1 x daily - 7 x weekly - 1 sets - 1 reps - 30 sec hold - Doorway Pec Stretch at 120 Degrees Abduction  - 1 x daily - 7 x weekly - 1 sets - 1 reps - 30 sec hold   ASSESSMENT:  CLINICAL IMPRESSION: Patient is a 48 y.o. female who was seen today for physical therapy  treatment for pelvic pain. Patient continues to use the dilator. She is not able to place the dilator all the way due to her shorten vaginal canal from her cancer treatments. She is able to use the bigger diameter of the dilator easier each week. Pelvic floor strength continues to be 3/5. She understands ways to reduce discomfort with penile penetration by being on top to gauge the depth of the penis or using an Ohnut.  Patient pelvic floor relaxes better with manual work and hip stretches at the same time. Patient will benefit from skilled therapy to improve lengthening of the vaginal tissue to reduce pain after penetration and improve strength to reduce leakage.   OBJECTIVE IMPAIRMENTS: decreased coordination, decreased strength, increased fascial restrictions, increased muscle spasms, and pain.   ACTIVITY LIMITATIONS: continence  PARTICIPATION LIMITATIONS: interpersonal relationship and community  activity  PERSONAL FACTORS: 3+ comorbidities: hx of hypertension, preeclampsia, hyperlipidemia, OSA, diabetes, squamous cell carcinoma of cervix s/p radiation and chemotherapy, morbid obesity  are also affecting patient's functional outcome.   REHAB POTENTIAL: Excellent  CLINICAL DECISION MAKING: Evolving/moderate complexity  EVALUATION COMPLEXITY: Moderate   GOALS: Goals reviewed with patient? Yes  SHORT TERM GOALS: Target date: 04/13/23  Patient independent with initial flexibility HEP.  Baseline: not educated yet Goal status: Met 03/30/23   LONG TERM GOALS: Target date: 09/13/23  Patient independent with advanced HEP for core and pelvic floor strength.  Baseline: not educated yet Goal status: INITIAL  2.  Patient understands ways to manage pelvic pain with penetration with the Ohnut, breathing techniques, warming techniques, and ways to relax the tissue.  Baseline: not educated yet.  Goal status: Met 04/20/23  3.  Pelvic floor strength is >/= 3/5 to reduce urinary leakage >/= 50%  with coughing and sneezing.  Baseline: Pelvic strength 2/5 Goal status: INITIAL  4.  Patient is able to have vaginal penetration with pain afterwards reduced >/= 4/10 due to lengthening of the tissue.  Baseline: pain level 7/10 Goal status: INITIAL   PLAN:  PT FREQUENCY: 1-2x/week  PT DURATION: 6 months  PLANNED INTERVENTIONS: 97110-Therapeutic exercises, 97530- Therapeutic activity, 97112- Neuromuscular re-education, 97140- Manual therapy, Patient/Family education, Dry Needling, Spinal mobilization, and Biofeedback  PLAN FOR NEXT SESSION: hip stretches for the inner thigh, manual work to the pelvic floor, core strength   Eulis Foster, PT 04/20/23 1:55 PM

## 2023-04-22 ENCOUNTER — Other Ambulatory Visit: Payer: Self-pay | Admitting: Family Medicine

## 2023-04-22 ENCOUNTER — Ambulatory Visit: Payer: Self-pay | Admitting: Family Medicine

## 2023-04-22 DIAGNOSIS — I10 Essential (primary) hypertension: Secondary | ICD-10-CM

## 2023-04-22 DIAGNOSIS — Z760 Encounter for issue of repeat prescription: Secondary | ICD-10-CM

## 2023-04-22 NOTE — Telephone Encounter (Signed)
Copied from CRM [id number]. Topic: Clinical - Medication Refill >> Apr 22, 2023  3:27 PM Dennison Nancy wrote: Most Recent Primary Care Visit:  Provider: Wyvonne Lenz  Department: [facility]  Visit Type: NURSE VISIT  Date: 03/09/2023  Medication: amLODipine-valsartan (EXFORGE) 10-320 MG tablet  Has the patient contacted their pharmacy? Yes (Agent: If no, request that the patient contact the pharmacy for the refill. If patient does not wish to contact the pharmacy document the reason why and proceed with request.) (Agent: If yes, when and what did the pharmacy advise?)  Is this the correct pharmacy for this prescription? Yes If no, delete pharmacy and type the correct one.  This is the patient's preferred pharmacy:  [phone] - [place], [place] - [address] AT [facility] [address] [place] [address] [place] [address] Phone: [phone] Fax: [phone]   Has the prescription been filled recently? No  Is the patient out of the medication? Yes  Has the patient been seen for an appointment in the last year OR does the patient have an upcoming appointment? Yes  Can we respond through MyChart? Yes  Agent: Please be advised that Rx refills may take up to 3 business days. We ask that you follow-up with your pharmacy.

## 2023-04-22 NOTE — Telephone Encounter (Signed)
  Chief Complaint: pain, pressure with urination and frequency Symptoms: see above; states was just treated for one and symptoms are back Frequency: every time she urinates Pertinent Negatives: Patient denies fever, chills,  Disposition: [] ED /[] Urgent Care (no appt availability in office) / [x] Appointment(In office/virtual)/ []  [place] [facility]/ [] Home Care/ [] Refused Recommended Disposition /[] [facility]/ []  Follow-up with PCP Additional Notes: apt made for tomorrow.  Care advice given, denies questions, instructed to go to er if becomes worse. Reason for Disposition  Urinating more frequently than usual (i.e., frequency)  Answer Assessment - Initial Assessment Questions 1. SYMPTOM: "What's the main symptom you're concerned about?" (e.g., frequency, incontinence)     Side hurts, pressure when she urinates, frequency 2. ONSET: "When did the  side  start?"     2 weeks 3. PAIN: "Is there any pain?" If Yes, ask: "How bad is it?" (Scale: 1-10; mild, moderate, severe)     7-8/10 when she has to pee 4. CAUSE: "What do you think is causing the symptoms?"     uti 5. OTHER SYMPTOMS: "Do you have any other symptoms?" (e.g., blood in urine, fever, flank pain, pain with urination)     Flank pain, pressure with urination 6. PREGNANCY: "Is there any chance you are pregnant?" "When was your last menstrual period?"     na  Protocols used: Urinary Symptoms-A-AH

## 2023-04-23 ENCOUNTER — Other Ambulatory Visit (HOSPITAL_BASED_OUTPATIENT_CLINIC_OR_DEPARTMENT_OTHER): Payer: Self-pay

## 2023-04-23 ENCOUNTER — Other Ambulatory Visit (HOSPITAL_BASED_OUTPATIENT_CLINIC_OR_DEPARTMENT_OTHER): Payer: Self-pay | Admitting: *Deleted

## 2023-04-23 ENCOUNTER — Encounter (HOSPITAL_BASED_OUTPATIENT_CLINIC_OR_DEPARTMENT_OTHER): Payer: Self-pay | Admitting: Family Medicine

## 2023-04-23 ENCOUNTER — Ambulatory Visit (HOSPITAL_BASED_OUTPATIENT_CLINIC_OR_DEPARTMENT_OTHER): Payer: Medicaid Other | Admitting: Family Medicine

## 2023-04-23 VITALS — BP 130/93 | HR 92 | Ht 61.0 in | Wt 234.0 lb

## 2023-04-23 DIAGNOSIS — N390 Urinary tract infection, site not specified: Secondary | ICD-10-CM | POA: Insufficient documentation

## 2023-04-23 DIAGNOSIS — Z760 Encounter for issue of repeat prescription: Secondary | ICD-10-CM | POA: Diagnosis not present

## 2023-04-23 LAB — POCT URINALYSIS DIP (CLINITEK)
Bilirubin, UA: NEGATIVE
Glucose, UA: NEGATIVE mg/dL
Ketones, POC UA: NEGATIVE mg/dL
Nitrite, UA: NEGATIVE
POC PROTEIN,UA: 100 — AB
Spec Grav, UA: 1.025 (ref 1.010–1.025)
Urobilinogen, UA: 0.2 U/dL
pH, UA: 5.5 (ref 5.0–8.0)

## 2023-04-23 MED ORDER — AMLODIPINE BESYLATE-VALSARTAN 10-320 MG PO TABS: 1.0000 | ORAL_TABLET | Freq: Every day | ORAL | 3 refills | Status: AC

## 2023-04-23 MED ORDER — CEPHALEXIN 500 MG PO CAPS: 500.0000 mg | ORAL_CAPSULE | Freq: Two times a day (BID) | ORAL | 0 refills | Status: DC

## 2023-04-23 NOTE — Progress Notes (Signed)
Acute Care Office Visit  Subjective:   Tamara Osborne Sep 29, 1975 04/23/2023  Chief Complaint  Patient presents with   Urinary Tract Infection    Patient states she has been having recurrent UTIs having pressure when she needs to urinate and also has been having pain with urination. Also has been having pain her side.    HPI: URINARY SYMPTOMS  Tamara Osborne is a 48 year old female with history of squamous cell carcinoma of cervix, CKD stage III, hypertension, type 2 diabetes presenting for possible recurring UTI.  Reports going to the ER on 02/26/23 for hematuria. She was concerned due to possible vaginal bleeding with hx of cervical cancer. She is undergoing pelvic PT currently post radiation. She states due to chemo and radiation she was technically in "menopausal state" . She does use a vaginal moisturizer and reports stress incontinence frequently.  She was given ABX while in the ER (Keflex) for hemorrhagic cysitis.  Her urine culture showed sensitivity to cephalosporins, fluoroquinolones, Bactrim and there were no patterns of resistance.  However, she was called by the ER and her antibiotic was switched to levofloxacin for resistance.  This PCP unable to see patterns of resistance and patient was having resolution of symptoms.  Patient follow-up with her PCP and was recommended to return in approximately 6 weeks for recheck of urine due to presence of protein and slight trace of blood.  She reports urinary pressure and pain with urination starting for the past week. She has not noticed blood in urine or vaginal bleeding.  She denies fevers, chills, nausea, vomiting, diarrhea or flank pain.  Denies STI exposure.    The following portions of the patient's history were reviewed and updated as appropriate: past medical history, past surgical history, family history, social history, allergies, medications, and problem list.   Patient Active Problem List   Diagnosis Date Noted    Recurrent UTI 04/23/2023   HSV-2 infection 07/13/2022   Dyspareunia in female 10/23/2021   Hyperlipidemia associated with type 2 diabetes mellitus (HCC) 07/03/2021   OSA (obstructive sleep apnea) 07/31/2020   Other hyperlipidemia 01/10/2020   Insulin resistance 01/10/2020   Class 2 severe obesity with serious comorbidity and body mass index (BMI) of 39.0 to 39.9 in adult [facility]) 01/10/2020   Peripheral 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) 04/25/2019   CKD (chronic kidney disease), symptom management only, stage 3 (moderate) (HCC) 03/30/2019   Generalized anxiety disorder 03/30/2019   Hypertension associated with type 2 diabetes mellitus (HCC)    Squamous cell carcinoma of cervix (HCC) 03/23/2019   Deficiency anemia 03/22/2019   Past Medical History:  Diagnosis Date   Anemia    Angio-edema    Anxiety    Bartholin cyst    Cervical cancer (HCC)    Chronic kidney disease    Constipation    Depression    Deviated septum    Diabetes mellitus without complication (HCC)    Difficult intravenous access    used port for 05-09-2019 surgery   Fibroids    History of blood transfusion    2 units given 04-26-2019, has had total of 8 units since jan 2021   History of blood transfusion 1 unit given 05-30-19   iv fluids also given   History of cervical cancer 07/16/2022   History of kidney problems    History of radiation therapy 03/23/2019-05/04/2019   Cervical external beam   Dr Antony Blackbird   History of radiation therapy 05/09/2019-06/05/2019   vaginal brachytherapy  Dr Antony Blackbird   History of recent blood transfusion 05/16/2019   2 units given  per dr Lambert Mody at cancer center   Hypertension    Neuropathy    both thumbs   Obesity    Palpitations    PONV (postoperative nausea and vomiting)    Prediabetes    Preeclampsia 2006   Sleep apnea    no cpap used insurance would not cover, osa severe per pt   SOB (shortness of breath)    Squamous cell carcinoma of cervix (HCC) 03/23/2019    Swallowing difficulty    Symptomatic skin lesion 07/14/2022   Vitamin D deficiency 01/10/2020   Past Surgical History:  Procedure Laterality Date   CESAREAN SECTION WITH BILATERAL TUBAL LIGATION  11/10/2004       DILATION AND CURETTAGE OF UTERUS  1997   IR IMAGING GUIDED PORT INSERTION  04/04/2019   IR REMOVAL TUN ACCESS W/ PORT W/O FL MOD SED  10/27/2021   OPERATIVE ULTRASOUND N/A 05/09/2019   Procedure: OPERATIVE ULTRASOUND;  Surgeon: Antony Blackbird, MD;  Location: [facility];  Service: Urology;  Laterality: N/A;   OPERATIVE ULTRASOUND N/A 05/15/2019   Procedure: OPERATIVE ULTRASOUND;  Surgeon: Antony Blackbird, MD;  Location: [facility];  Service: Urology;  Laterality: N/A;   OPERATIVE ULTRASOUND N/A 05/22/2019   Procedure: OPERATIVE ULTRASOUND;  Surgeon: Antony Blackbird, MD;  Location: [facility];  Service: Urology;  Laterality: N/A;   OPERATIVE ULTRASOUND N/A 05/29/2019   Procedure: OPERATIVE ULTRASOUND;  Surgeon: Antony Blackbird, MD;  Location: [facility];  Service: Urology;  Laterality: N/A;   OPERATIVE ULTRASOUND N/A 06/05/2019   Procedure: OPERATIVE ULTRASOUND;  Surgeon: Antony Blackbird, MD;  Location: [facility];  Service: Urology;  Laterality: N/A;   TANDEM RING INSERTION N/A 05/09/2019   Procedure: TANDEM RING INSERTION;  Surgeon: Antony Blackbird, MD;  Location: [facility];  Service: Urology;  Laterality: N/A;   TANDEM RING INSERTION N/A 05/15/2019   Procedure: TANDEM RING INSERTION;  Surgeon: Antony Blackbird, MD;  Location: [facility];  Service: Urology;  Laterality: N/A;   TANDEM RING INSERTION N/A 05/22/2019   Procedure: TANDEM RING INSERTION;  Surgeon: Antony Blackbird, MD;  Location: [facility];  Service: Urology;  Laterality: N/A;   TANDEM RING INSERTION N/A 05/29/2019   Procedure: TANDEM RING INSERTION;  Surgeon: Antony Blackbird, MD;  Location: [facility];   Service: Urology;  Laterality: N/A;   TANDEM RING INSERTION N/A 06/05/2019   Procedure: TANDEM RING INSERTION;  Surgeon: Antony Blackbird, MD;  Location: [facility];  Service: Urology;  Laterality: N/A;   Family History  Problem Relation Age of Onset   Brain cancer Mother        lung cancer to brain   Hypertension Mother    Cancer Mother    Depression Mother    Anxiety disorder Mother    Diabetes Father    Kidney disease Father    Cancer Father        kidney cancer   Heart failure Sister    Heart attack Sister    Heart failure Paternal Aunt    Cancer Maternal Grandmother    Kidney failure Maternal Grandfather    Outpatient Medications Prior to Visit  Medication Sig Dispense Refill   Accu-Chek Softclix Lancets lancets Use to check blood sugar TID. 100 each 2   azelastine (ASTELIN) 0.1 % nasal spray Place 1 spray into both  nostrils 2 (two) times daily as needed. Use in each nostril as directed 30 mL 5   Blood Glucose Monitoring Suppl (ACCU-CHEK GUIDE) w/Device KIT Use to check blood sugar TID. 1 kit 0   Cholecalciferol 50 MCG (2000 UT) TABS Take by mouth.     EPINEPHrine (EPIPEN 2-PAK) 0.3 mg/0.3 mL IJ SOAJ injection Inject 0.3 mg into the muscle as needed for anaphylaxis. 2 each 1   fluticasone (FLONASE) 50 MCG/ACT nasal spray Place 2 sprays into both nostrils daily. 16 g 5   glucose blood (ACCU-CHEK GUIDE) test strip Use to check blood sugar TID. 100 each 2   ipratropium (ATROVENT) 0.06 % nasal spray Place 2 sprays into both nostrils 3 (three) times daily as needed for rhinitis (drainage). 15 mL 5   levocetirizine (XYZAL) 5 MG tablet Take 1 tablet (5 mg total) by mouth every evening. 90 tablet 1   RESTASIS 0.05 % ophthalmic emulsion 1 drop 2 (two) times daily.     Semaglutide, 1 MG/DOSE, 4 MG/3ML SOPN Inject 1 mg as directed once a week. 3 mL 0   amLODipine-valsartan (EXFORGE) 10-320 MG tablet Take 1 tablet by mouth daily. 90 tablet 0   chlorthalidone (HYGROTON) 25 MG  tablet Take 1 tablet (25 mg total) by mouth daily. (Patient taking differently: Take 12.5 mg by mouth daily.) 90 tablet 3   No facility-administered medications prior to visit.   Allergies  Allergen Reactions   Penicillins     Not sure a child allergy   Shellfish Allergy Swelling    Seafood also any kind   Sulfa Antibiotics     Not sure a child allergy     ROS: A complete ROS was performed with pertinent positives/negatives noted in the HPI. The remainder of the ROS are negative.    Objective:   Today's Vitals   04/23/23 0900  BP: (!) 130/93  Pulse: 92  SpO2: 97%  Weight: 234 lb (106.1 kg)  Height: 5\' 1"  (1.549 m)    GENERAL: Well-appearing, in NAD. Well nourished.  SKIN: Pink, warm and dry. Head: Normocephalic. NECK: Trachea midline. Full ROM w/o pain or tenderness.  RESPIRATORY: Chest wall symmetrical. Respirations even and non-labored. GI: Abdomen soft, non-tender. Normoactive bowel sounds. No rebound tenderness. No hepatomegaly or splenomegaly. No CVA tenderness.  MSK: Muscle tone and strength appropriate for age. Joints w/o tenderness, redness, or swelling.  EXTREMITIES: Without clubbing, cyanosis, or edema.  NEUROLOGIC: No motor or sensory deficits. Steady, even gait. C2-C12 intact.  PSYCH/MENTAL STATUS: Alert, oriented x 3. Cooperative, appropriate mood and affect.    Results for orders placed or performed in visit on 04/23/23  POCT URINALYSIS DIP (CLINITEK)  Result Value Ref Range   Color, UA yellow yellow   Clarity, UA cloudy (A) clear   Glucose, UA negative negative mg/dL   Bilirubin, UA negative negative   Ketones, POC UA negative negative mg/dL   Spec Grav, UA 0.865 7.846 - 1.025   Blood, UA moderate (A) negative   pH, UA 5.5 5.0 - 8.0   POC PROTEIN,UA =100 (A) negative, trace   Urobilinogen, UA 0.2 0.2 or 1.0 E.U./dL   Nitrite, UA Negative Negative   Leukocytes, UA Small (1+) (A) Negative      Assessment & Plan:   1. Recurrent UTI Given  patient's history of radiation and chemotherapy for cervical cancer and now recurring UTI with history of stress incontinence and undergoing pelvic floor therapy, I believe would be beneficial to have further evaluation your neurology.  Urinalysis does show acute cystitis and will treat with Keflex as previous urine culture showed sensitivity to antibiotic and patient tolerated well.  Will culture this urine and change antibiotic therapy as needed.  Patient to return if symptoms are worsening or not improving with start of antibiotic. - Ambulatory referral to Urology - Urine Culture - cephALEXin (KEFLEX) 500 MG capsule; Take 1 capsule (500 mg total) by mouth 2 (two) times daily.  Dispense: 14 capsule; Refill: 0  2. Medication refill Patient requesting refill fill of amlodipine valsartan.  Sent to patient's requested pharmacy - amLODipine-valsartan (EXFORGE) 10-320 MG tablet; Take 1 tablet by mouth daily.  Dispense: 90 tablet; Refill: 3   Meds ordered this encounter  Medications   cephALEXin (KEFLEX) 500 MG capsule    Sig: Take 1 capsule (500 mg total) by mouth 2 (two) times daily.    Dispense:  14 capsule    Refill:  0    Supervising Provider:   DE [place], RAYMOND J [[id number]]   amLODipine-valsartan (EXFORGE) 10-320 MG tablet    Sig: Take 1 tablet by mouth daily.    Dispense:  90 tablet    Refill:  3    Supervising Provider:   DE [place], RAYMOND J [[id number]]   Lab Orders         Urine Culture         POCT URINALYSIS DIP (CLINITEK)      Return in about 2 months (around 06/21/2023) for Follow up HTN, CKD, Chronic Conditions (labs at visit if fasting) .    Patient to reach out to office if new, worrisome, or unresolved symptoms arise or if no improvement in patient's condition. Patient verbalized understanding and is agreeable to treatment plan. All questions answered to patient's satisfaction.    Hilbert Bible, [place]

## 2023-04-24 LAB — URINE CULTURE

## 2023-04-26 ENCOUNTER — Encounter (HOSPITAL_BASED_OUTPATIENT_CLINIC_OR_DEPARTMENT_OTHER): Payer: Self-pay | Admitting: Family Medicine

## 2023-04-26 NOTE — Progress Notes (Signed)
 Hi Akina, Your urine culture showed mixed bacteria present and a small amount of bacteria in that.  I would still follow-up with urology given these recurring flares of UTI-like symptoms.  Please finish the Keflex antibiotic and follow-up with [place] if if no improvement of your symptoms.

## 2023-05-06 ENCOUNTER — Encounter: Payer: Self-pay | Attending: Radiation Oncology | Admitting: Physical Therapy

## 2023-05-06 ENCOUNTER — Encounter: Payer: Self-pay | Admitting: Physical Therapy

## 2023-05-06 DIAGNOSIS — M6281 Muscle weakness (generalized): Secondary | ICD-10-CM | POA: Diagnosis present

## 2023-05-06 DIAGNOSIS — R102 Pelvic and perineal pain: Secondary | ICD-10-CM | POA: Diagnosis present

## 2023-05-06 NOTE — Therapy (Signed)
 OUTPATIENT PHYSICAL THERAPY FEMALE PELVIC TREATMENT   Patient Name: Tamara Osborne MRN: [id number] DOB:1975/08/22, 48 y.o., female Today's Date: 05/06/2023  END OF SESSION:  PT End of Session - 05/06/23 0935     Visit Number 7    Date for PT Re-Evaluation 09/13/23    Authorization Type [employer]    Authorization Time Period 03/16/2023 - 06/13/2023    Authorization - Visit Number 6    Authorization - Number of Visits 11    PT Start Time 0930    PT Stop Time 1020    PT Time Calculation (min) 50 min    Activity Tolerance Patient tolerated treatment well    Behavior During Therapy WFL for tasks assessed/performed             Past Medical History:  Diagnosis Date   Anemia    Angio-edema    Anxiety    Bartholin cyst    Cervical cancer (HCC)    Chronic kidney disease    Constipation    Depression    Deviated septum    Diabetes mellitus without complication (HCC)    Difficult intravenous access    used port for 05-09-2019 surgery   Fibroids    History of blood transfusion    2 units given 04-26-2019, has had total of 8 units since jan 2021   History of blood transfusion 1 unit given 05-30-19   iv fluids also given   History of cervical cancer 07/16/2022   History of kidney problems    History of radiation therapy 03/23/2019-05/04/2019   Cervical external beam   Dr Antony Blackbird   History of radiation therapy 05/09/2019-06/05/2019   vaginal brachytherapy   Dr Antony Blackbird   History of recent blood transfusion 05/16/2019   2 units given  per dr Lambert Mody at cancer center   Hypertension    Neuropathy    both thumbs   Obesity    Palpitations    PONV (postoperative nausea and vomiting)    Prediabetes    Preeclampsia 2006   Sleep apnea    no cpap used insurance would not cover, osa severe per pt   SOB (shortness of breath)    Squamous cell carcinoma of cervix (HCC) 03/23/2019   Swallowing difficulty    Symptomatic skin lesion 07/14/2022   Vitamin D deficiency 01/10/2020    Past Surgical History:  Procedure Laterality Date   CESAREAN SECTION WITH BILATERAL TUBAL LIGATION  11/10/2004       DILATION AND CURETTAGE OF UTERUS  1997   IR IMAGING GUIDED PORT INSERTION  04/04/2019   IR REMOVAL TUN ACCESS W/ PORT W/O FL MOD SED  10/27/2021   OPERATIVE ULTRASOUND N/A 05/09/2019   Procedure: OPERATIVE ULTRASOUND;  Surgeon: Antony Blackbird, MD;  Location: [facility];  Service: Urology;  Laterality: N/A;   OPERATIVE ULTRASOUND N/A 05/15/2019   Procedure: OPERATIVE ULTRASOUND;  Surgeon: Antony Blackbird, MD;  Location: [facility];  Service: Urology;  Laterality: N/A;   OPERATIVE ULTRASOUND N/A 05/22/2019   Procedure: OPERATIVE ULTRASOUND;  Surgeon: Antony Blackbird, MD;  Location: [facility];  Service: Urology;  Laterality: N/A;   OPERATIVE ULTRASOUND N/A 05/29/2019   Procedure: OPERATIVE ULTRASOUND;  Surgeon: Antony Blackbird, MD;  Location: [facility];  Service: Urology;  Laterality: N/A;   OPERATIVE ULTRASOUND N/A 06/05/2019   Procedure: OPERATIVE ULTRASOUND;  Surgeon: Antony Blackbird, MD;  Location: [facility];  Service: Urology;  Laterality: N/A;   TANDEM  RING INSERTION N/A 05/09/2019   Procedure: TANDEM RING INSERTION;  Surgeon: Antony Blackbird, MD;  Location: [facility];  Service: Urology;  Laterality: N/A;   TANDEM RING INSERTION N/A 05/15/2019   Procedure: TANDEM RING INSERTION;  Surgeon: Antony Blackbird, MD;  Location: [facility];  Service: Urology;  Laterality: N/A;   TANDEM RING INSERTION N/A 05/22/2019   Procedure: TANDEM RING INSERTION;  Surgeon: Antony Blackbird, MD;  Location: [facility];  Service: Urology;  Laterality: N/A;   TANDEM RING INSERTION N/A 05/29/2019   Procedure: TANDEM RING INSERTION;  Surgeon: Antony Blackbird, MD;  Location: [facility];  Service: Urology;  Laterality: N/A;   TANDEM RING INSERTION N/A 06/05/2019   Procedure: TANDEM RING  INSERTION;  Surgeon: Antony Blackbird, MD;  Location: [facility];  Service: Urology;  Laterality: N/A;   Patient Active Problem List   Diagnosis Date Noted   Recurrent UTI 04/23/2023   HSV-2 infection 07/13/2022   Dyspareunia in female 10/23/2021   Hyperlipidemia associated with type 2 diabetes mellitus (HCC) 07/03/2021   OSA (obstructive sleep apnea) 07/31/2020   Other hyperlipidemia 01/10/2020   Insulin resistance 01/10/2020   Class 2 severe obesity with serious comorbidity and body mass index (BMI) of 39.0 to 39.9 in adult [facility]) 01/10/2020   Peripheral 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) 04/25/2019   CKD (chronic kidney disease), symptom management only, stage 3 (moderate) (HCC) 03/30/2019   Generalized anxiety disorder 03/30/2019   Hypertension associated with type 2 diabetes mellitus (HCC)    Squamous cell carcinoma of cervix (HCC) 03/23/2019   Deficiency anemia 03/22/2019    PCP: Alyson Reedy, FNP  REFERRING PROVIDER:  C53.9 (ICD-10-CM) - Squamous cell carcinoma of cervix (HCC)   REFERRING DIAG: Antony Blackbird, MD   THERAPY DIAG:  Muscle weakness (generalized)  Pelvic pain  Rationale for Evaluation and Treatment: Rehabilitation  ONSET DATE: 11/2022   SUBJECTIVE:                                                                                                                                                                                           SUBJECTIVE STATEMENT: I am going to see a urologist for my bladder. Patient was doing well with dilators till she had a UTI and had to stop for a period of time. No intercourse in the past several weeks. Urinary leakage is 70% better. Patient is able to delay her urgency better.   Fluid intake: Yes: water    PAIN:  Are you having pain? Yes NPRS scale: 7/10 Pain location: Vaginal  Pain type: sharp and soreness that can extend  for several days  Pain description: intermittent   Aggravating factors: vaginal  penetration Relieving factors: time  PRECAUTIONS: Other: cancer with radiation and chemotherapy  RED FLAGS: None   WEIGHT BEARING RESTRICTIONS: No  FALLS:  Has patient fallen in last 6 months? 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    BOWEL MOVEMENT: no issues   URINATION: Pain with urination: No Fully empty bladder: Yes:   Stream: Strong Urgency: Yes:   Frequency: every 3 hours, does not wake up during the night Leakage: Coughing and Sneezing Pads: Yes: when out of home  INTERCOURSE: Pain with intercourse: After Intercourse Ability to have vaginal penetration:  Yes:   Climax: not able to [employer] Scale: 1/3  PREGNANCY: Vaginal deliveries 1 Tearing Yes:    PROLAPSE: None   OBJECTIVE:  Note: Objective measures were completed at Evaluation unless otherwise noted.   PATIENT SURVEYS:  POPIQ-7 38 PFIQ-7 38  COGNITION: Overall cognitive status: Within functional limits for tasks assessed     SENSATION: Light touch: Appears intact Proprioception: Appears intact    POSTURE: rounded shoulders, forward head, and decreased lumbar lordosis  PELVIC ALIGNMENT:correct alignment  LUMBARAROM/PROM: lumbar ROM decreased by 50% 05/06/23: lumbar extension decreased by 50%, bilateral sidebending decreased by 25%   LOWER EXTREMITY ZOX:WRUE ROM in bilateral hips.  Tightness in the hip adductors.     LOWER EXTREMITY MMT: bilateral hip strength is 5/5   PALPATION:   General  Difficulty contracting abdominals and performing diaphragmatic breathing.                 External Perineal Exam tightness in the urogenital diaphragm, tenderness located in the levator ani and  perineal body                             Internal Pelvic Floor tightness throughout the pelvic floor muscles, her vaginal canal is shortened  Patient confirms identification and approves PT to assess internal pelvic floor and treatment Yes  PELVIC MMT:   MMT eval 03/30/23 04/06/23 04/14/23 04/20/23  Vaginal 2/5 3/5 3/5 10 sec 3/5 3/5  (Blank rows = not tested)        TONE: increased    TODAY'S TREATMENT:   05/06/23 Manual: Spinal mobilization: PA and rotational mobilization to T8-L5 grade 3 to increase extension  Neuromuscular re-education: Form correction: Quadruped with hands on the ball and roll forward to stretch her back and arms  then back to contract the abdomen Sitting with rolling ball on ground side to side to increase lumbar flexion and engage the obliques Plank on wall and lift opposite extremity to increase back strength and stretch anterior trunk Plank on wall and alternate hip abduction working on trunk control Wall squats with ball to improve hip mobility Exercises: Stretches/mobility: Prone on elbows holding 2 minutes Prone press ups to increase extension Prone stretch bilateral hip rotators with therapist assistance Prone quadriceps stretch with therapist assistance  Strengthening: Prone with foot on the mat and extend the leg working on gluteal contraction    04/20/23 Manual: Internal pelvic floor techniques: No emotional/communication barriers or cognitive limitation. Patient is motivated to learn. Patient understands and agrees with treatment goals and plan.  PT explains patient will be examined in standing, sitting, and lying down to see how their muscles and joints work. When they are ready, they will be asked to remove their underwear so PT can examine their perineum. The patient is also given the option of providing their own chaperone as one is not provided in our facility. The patient also has the right and is explained the right to defer or refuse any  part of the evaluation or treatment including the internal exam. With the patient's consent, PT will use one gloved finger to gently assess the muscles of the pelvic floor, seeing how well it contracts and relaxes and if there is muscle symmetry. After, the patient will get dressed and PT and patient will discuss exam findings and plan of care. PT and patient discuss plan of care, schedule, attendance policy and HEP activities.  Supine therapist finger in the vaginal canal working on the pelvic floor, working on elongating  the vaginal canal with hip movements Neuromuscular re-education: Core facilitation: Plank against the wall while moving hands to engage the core Plank against the wall and march to work the [employer] push ups to work the core Standing hip rotation with core engaged and working on [employer] 12 times  Exercises: Stretches/mobility: Sit in chair and roll the ball forward and perform diaphragmatic breathing to open the back up and inner thigh area then roll the ball back and forth Sit in chair and hold ball over head and lean side to side and trunk rotation Doorway stretch to open up the anterior trunk Sitting hip abduction and lean forward to stretch the inner thigh Butterfly stretch  Strengthening: Therapeutic activities: Functional strengthening activities: Discussed with patient on using the ohnut or be on top to adapt to the shorter vaginal canal. This will help with managing her pain with penetration.     04/14/23 Manual: Internal pelvic floor techniques: No emotional/communication barriers or cognitive limitation. Patient is motivated to learn. Patient understands and agrees with treatment goals and plan. PT explains patient will be examined in standing, sitting, and lying down to see how their muscles and joints work. When they are ready, they will be asked to remove their underwear so PT can examine their perineum. The patient is also given the option of providing  their own chaperone as one is not provided in our facility. The patient also has the right and is explained the right to defer or refuse any part of the evaluation or treatment including the internal exam. With the patient's consent, PT will use one gloved finger to gently assess the muscles of the pelvic floor, seeing how well it contracts and relaxes and if there is muscle symmetry. After, the patient will get dressed and PT and patient will discuss exam findings and plan of care. PT and patient discuss plan of care, schedule, attendance policy and HEP activities.  Therapist finger in the vaginal canal working on lengthening of the canal, working around the bladder, manual work to the [employer] and obturator internist, perineal body, and urogenital diaphragm Exercises: Stretches/mobility:to increase the mobility of the pelvic floor muscles to prepare for manual work Sitting with legs apart and therapist gives pressure on her back to increase muscle length Prone press up to elongate the anterior trunk Prone hip flexor stretch  Prone hip internal rotation Quadruped with hips internally rotated and rock back and forth Strengthening: Nustep level 5  for 8 minutes while assessing patient  PATIENT EDUCATION: 03/30/23 Education details: Access Code: [email or web address] Person educated: Patient Education method: Explanation, Demonstration, Tactile cues, Verbal cues, and Handouts Education comprehension: verbalized understanding, returned demonstration, verbal cues required, tactile cues required, and needs further education    HOME EXERCISE PROGRAM: 03/30/23 Access Code: [email or web address] URL: [email or web address] Date: 03/30/2023 Prepared by: Eulis Foster  Exercises - Seated Piriformis Stretch with Trunk Bend  - 1 x daily - 7 x weekly - 1 sets - 1 reps - 30 sec hold - Seated Hamstring Stretch  - 1 x daily - 7 x weekly - 1 sets - 1 reps - 30 sec hold - Seated Hip Adductor Stretch  - 1 x  daily - 7 x weekly - 1 sets - 1 reps - 30 sec hold - Doorway Pec Stretch at 120 Degrees Abduction  - 1 x daily - 7 x weekly - 1 sets - 1 reps - 30 sec hold   ASSESSMENT:  CLINICAL IMPRESSION: Patient is a 48 y.o. female who was seen today for physical therapy  treatment for pelvic pain. Patient reports she is able to hold her urine 2 hours now when she has the urgency. Patient reports her urinary leakage is 70% better.  She has increased in lumbar ROM except for extension therefore worked in therapy to increase extension and stretch the anterior trunk. She needs verbal cues on how to perform her exercises due to the memory from past chemotherapy treatment. She will resume using the dilators since she is over the UTI. Patient will benefit from skilled therapy to improve lengthening of the vaginal tissue to reduce pain after penetration and improve strength to reduce leakage.   OBJECTIVE IMPAIRMENTS: decreased coordination, decreased strength, increased fascial restrictions, increased muscle spasms, and pain.   ACTIVITY LIMITATIONS: continence  PARTICIPATION LIMITATIONS: interpersonal relationship and community activity  PERSONAL FACTORS: 3+ comorbidities: hx of hypertension, preeclampsia, hyperlipidemia, OSA, diabetes, squamous cell carcinoma of cervix s/p radiation and chemotherapy, morbid obesity  are also affecting patient's functional outcome.   REHAB POTENTIAL: Excellent  CLINICAL DECISION MAKING: Evolving/moderate complexity  EVALUATION COMPLEXITY: Moderate   GOALS: Goals reviewed with patient? Yes  SHORT TERM GOALS: Target date: 04/13/23  Patient independent with initial flexibility HEP.  Baseline: not educated yet Goal status: Met 03/30/23   LONG TERM GOALS: Target date: 09/13/23  Patient independent with advanced HEP for core and pelvic floor strength.  Baseline: not educated yet Goal status: INITIAL  2.  Patient understands ways to manage pelvic pain with penetration  with the Ohnut, breathing techniques, warming techniques, and ways to relax the tissue.  Baseline: not educated yet.  Goal status: Met 04/20/23  3.  Pelvic floor strength is >/= 3/5 to reduce urinary leakage >/= 50% with coughing and sneezing.  Baseline: Pelvic strength 2/5 Goal status: INITIAL  4.  Patient is able to have vaginal penetration with pain afterwards reduced >/= 4/10 due to lengthening of the tissue.  Baseline: pain level 7/10 Goal status: INITIAL   PLAN:  PT FREQUENCY: 1-2x/week  PT DURATION: 6 months  PLANNED INTERVENTIONS: 97110-Therapeutic exercises, 97530- Therapeutic activity, 97112- Neuromuscular re-education, 97140- Manual therapy, Patient/Family education, Dry Needling, Spinal mobilization, and Biofeedback  PLAN FOR NEXT SESSION: hip stretches for the inner thigh, manual work to the pelvic floor, core strength   Eulis Foster, PT 05/06/23 10:23 AM

## 2023-05-13 ENCOUNTER — Encounter: Payer: Self-pay | Admitting: Physical Therapy

## 2023-05-13 DIAGNOSIS — R102 Pelvic and perineal pain: Secondary | ICD-10-CM

## 2023-05-13 DIAGNOSIS — M6281 Muscle weakness (generalized): Secondary | ICD-10-CM

## 2023-05-13 NOTE — Therapy (Signed)
 OUTPATIENT PHYSICAL THERAPY FEMALE PELVIC TREATMENT   Patient Name: Tamara Osborne MRN: [id number] DOB:22-Aug-1975, 48 y.o., female Today's Date: 05/13/2023  END OF SESSION:  PT End of Session - 05/13/23 1135     Visit Number 8    Date for PT Re-Evaluation 09/13/23    Authorization Type [employer]    Authorization Time Period 03/16/2023 - 06/13/2023    Authorization - Visit Number 7    Authorization - Number of Visits 11    PT Start Time 1130    PT Stop Time 1215    PT Time Calculation (min) 45 min    Activity Tolerance Patient tolerated treatment well    Behavior During Therapy WFL for tasks assessed/performed             Past Medical History:  Diagnosis Date   Anemia    Angio-edema    Anxiety    Bartholin cyst    Cervical cancer (HCC)    Chronic kidney disease    Constipation    Depression    Deviated septum    Diabetes mellitus without complication (HCC)    Difficult intravenous access    used port for 05-09-2019 surgery   Fibroids    History of blood transfusion    2 units given 04-26-2019, has had total of 8 units since jan 2021   History of blood transfusion 1 unit given 05-30-19   iv fluids also given   History of cervical cancer 07/16/2022   History of kidney problems    History of radiation therapy 03/23/2019-05/04/2019   Cervical external beam   Dr Antony Blackbird   History of radiation therapy 05/09/2019-06/05/2019   vaginal brachytherapy   Dr Antony Blackbird   History of recent blood transfusion 05/16/2019   2 units given  per dr Lambert Mody at cancer center   Hypertension    Neuropathy    both thumbs   Obesity    Palpitations    PONV (postoperative nausea and vomiting)    Prediabetes    Preeclampsia 2006   Sleep apnea    no cpap used insurance would not cover, osa severe per pt   SOB (shortness of breath)    Squamous cell carcinoma of cervix (HCC) 03/23/2019   Swallowing difficulty    Symptomatic skin lesion 07/14/2022   Vitamin D deficiency 01/10/2020    Past Surgical History:  Procedure Laterality Date   CESAREAN SECTION WITH BILATERAL TUBAL LIGATION  11/10/2004       DILATION AND CURETTAGE OF UTERUS  1997   IR IMAGING GUIDED PORT INSERTION  04/04/2019   IR REMOVAL TUN ACCESS W/ PORT W/O FL MOD SED  10/27/2021   OPERATIVE ULTRASOUND N/A 05/09/2019   Procedure: OPERATIVE ULTRASOUND;  Surgeon: Antony Blackbird, MD;  Location: [facility];  Service: Urology;  Laterality: N/A;   OPERATIVE ULTRASOUND N/A 05/15/2019   Procedure: OPERATIVE ULTRASOUND;  Surgeon: Antony Blackbird, MD;  Location: [facility];  Service: Urology;  Laterality: N/A;   OPERATIVE ULTRASOUND N/A 05/22/2019   Procedure: OPERATIVE ULTRASOUND;  Surgeon: Antony Blackbird, MD;  Location: [facility];  Service: Urology;  Laterality: N/A;   OPERATIVE ULTRASOUND N/A 05/29/2019   Procedure: OPERATIVE ULTRASOUND;  Surgeon: Antony Blackbird, MD;  Location: [facility];  Service: Urology;  Laterality: N/A;   OPERATIVE ULTRASOUND N/A 06/05/2019   Procedure: OPERATIVE ULTRASOUND;  Surgeon: Antony Blackbird, MD;  Location: [facility];  Service: Urology;  Laterality: N/A;   TANDEM  RING INSERTION N/A 05/09/2019   Procedure: TANDEM RING INSERTION;  Surgeon: Antony Blackbird, MD;  Location: [facility];  Service: Urology;  Laterality: N/A;   TANDEM RING INSERTION N/A 05/15/2019   Procedure: TANDEM RING INSERTION;  Surgeon: Antony Blackbird, MD;  Location: [facility];  Service: Urology;  Laterality: N/A;   TANDEM RING INSERTION N/A 05/22/2019   Procedure: TANDEM RING INSERTION;  Surgeon: Antony Blackbird, MD;  Location: [facility];  Service: Urology;  Laterality: N/A;   TANDEM RING INSERTION N/A 05/29/2019   Procedure: TANDEM RING INSERTION;  Surgeon: Antony Blackbird, MD;  Location: [facility];  Service: Urology;  Laterality: N/A;   TANDEM RING INSERTION N/A 06/05/2019   Procedure: TANDEM RING  INSERTION;  Surgeon: Antony Blackbird, MD;  Location: [facility];  Service: Urology;  Laterality: N/A;   Patient Active Problem List   Diagnosis Date Noted   Recurrent UTI 04/23/2023   HSV-2 infection 07/13/2022   Dyspareunia in female 10/23/2021   Hyperlipidemia associated with type 2 diabetes mellitus (HCC) 07/03/2021   OSA (obstructive sleep apnea) 07/31/2020   Other hyperlipidemia 01/10/2020   Insulin resistance 01/10/2020   Class 2 severe obesity with serious comorbidity and body mass index (BMI) of 39.0 to 39.9 in adult [facility]) 01/10/2020   Peripheral 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) 04/25/2019   CKD (chronic kidney disease), symptom management only, stage 3 (moderate) (HCC) 03/30/2019   Generalized anxiety disorder 03/30/2019   Hypertension associated with type 2 diabetes mellitus (HCC)    Squamous cell carcinoma of cervix (HCC) 03/23/2019   Deficiency anemia 03/22/2019    PCP: Alyson Reedy, FNP  REFERRING PROVIDER:  C53.9 (ICD-10-CM) - Squamous cell carcinoma of cervix (HCC)   REFERRING DIAG: Antony Blackbird, MD   THERAPY DIAG:  Muscle weakness (generalized)  Pelvic pain  Rationale for Evaluation and Treatment: Rehabilitation  ONSET DATE: 11/2022   SUBJECTIVE:                                                                                                                                                                                           SUBJECTIVE STATEMENT: I have not seen the urologist yet. I am back with the dilators. I am not having the pain I had with the UTI. I am on the biggest dilator. I have not had intercourse yet.  Fluid intake: Yes: water    PAIN:  Are you having pain? Yes NPRS scale: 7/10 Pain location: Vaginal  Pain type: sharp and soreness that can extend for several days  Pain description: intermittent   Aggravating factors: vaginal penetration Relieving factors:  time  PRECAUTIONS: Other: cancer with radiation and  chemotherapy  RED FLAGS: None   WEIGHT BEARING RESTRICTIONS: No  FALLS:  Has patient fallen in last 6 months? 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    BOWEL MOVEMENT: no issues   URINATION: Pain with urination: No Fully empty bladder: Yes:   Stream: Strong Urgency: Yes:   Frequency: every 3 hours, does not wake up during the night Leakage: Coughing and Sneezing Pads: Yes: when out of home  INTERCOURSE: Pain with intercourse: After Intercourse Ability to have vaginal penetration:  Yes:   Climax: not able to [employer] Scale: 1/3  PREGNANCY: Vaginal deliveries 1 Tearing Yes:    PROLAPSE: None   OBJECTIVE:  Note: Objective measures were completed at Evaluation unless otherwise noted.   PATIENT SURVEYS:  POPIQ-7 38 PFIQ-7 38  COGNITION: Overall cognitive status: Within functional limits for tasks assessed     SENSATION: Light touch: Appears intact Proprioception: Appears intact    POSTURE: rounded shoulders, forward head, and decreased lumbar lordosis  PELVIC ALIGNMENT:correct alignment  LUMBARAROM/PROM: lumbar ROM decreased by 50% 05/06/23: lumbar extension decreased by 50%, bilateral sidebending decreased by 25%   LOWER EXTREMITY ZOX:WRUE ROM in bilateral hips.  Tightness in the hip adductors.     LOWER EXTREMITY MMT: bilateral hip strength is 5/5   PALPATION:   General  Difficulty contracting abdominals and performing diaphragmatic breathing.                 External Perineal Exam tightness in the urogenital diaphragm, tenderness located in the levator ani and perineal body                             Internal Pelvic Floor tightness throughout  the pelvic floor muscles, her vaginal canal is shortened  Patient confirms identification and approves PT to assess internal pelvic floor and treatment Yes  PELVIC MMT:   MMT eval 03/30/23 04/06/23 04/14/23 04/20/23 05/13/23  Vaginal 2/5 3/5 3/5 10 sec 3/5 3/5 3/5  (Blank rows = not tested)        TONE: increased    TODAY'S TREATMENT:   05/13/23 Manual: Internal pelvic floor techniques: No emotional/communication barriers or cognitive limitation. Patient is motivated to learn. Patient understands and agrees with treatment goals and plan. PT explains patient will be examined in standing, sitting, and lying down to see how their muscles and joints work. When they are ready, they will be asked to remove their underwear so PT can examine their perineum. The patient is also given the option of providing their own chaperone as one is not provided in our facility. The patient also has the right and is explained the right to defer or refuse any part of the evaluation or treatment including the internal exam. With the patient's consent, PT will use one gloved finger to gently assess the muscles of the pelvic floor, seeing how well it contracts and relaxes and if there is muscle symmetry. After, the patient will get dressed and PT and patient will discuss exam findings and plan of care. PT and patient discuss plan of care, schedule, attendance policy and HEP activities.  Going through the vaginal canal  working around the bladder, sides of the urethra, working on elongating the canal, working on the [employer] and obturator internist Exercises: Stretches/mobility: Sitting with rolling ball on ground side to side to increase lumbar flexion and engage the obliques Sitting lifting physioball overhead then back down to work on ROM and strength Hold ball overhead in sitting and bring side to side to work on ROM and strength Strengthening: Press into the foam roll and engage the abdominals      05/06/23 Manual: Spinal mobilization: PA and rotational mobilization to T8-L5 grade 3 to increase extension  Neuromuscular re-education: Form correction: Quadruped with hands on the ball and roll forward to stretch her back and arms  then back to contract the abdomen Sitting with rolling ball on ground side to side to increase lumbar flexion and engage the obliques Plank on wall and lift opposite extremity to increase back strength and stretch anterior trunk Plank on wall and alternate hip abduction working on trunk control Wall squats with ball to improve hip mobility Exercises: Stretches/mobility: Prone on elbows holding 2 minutes Prone press ups to increase extension Prone stretch bilateral hip rotators with therapist assistance Prone quadriceps stretch with therapist assistance  Strengthening: Prone with foot on the mat and extend the leg working on gluteal contraction    04/20/23 Manual: Internal pelvic floor techniques: No emotional/communication barriers or cognitive limitation. Patient is motivated to learn. Patient understands and agrees with treatment goals and plan. PT explains patient will be examined in standing, sitting, and lying down to see how their muscles and joints work. When they are ready, they will be asked to remove their underwear so PT can examine their perineum. The patient is also given the option of providing their own chaperone as one is not provided in our facility. The patient also has the right and is explained the right to defer or refuse any part of the evaluation or treatment including the internal exam. With the patient's consent, PT will use one gloved finger to gently assess the muscles of the pelvic floor, seeing how well it contracts and relaxes and if there is muscle symmetry. After, the patient will get dressed and PT and patient will discuss exam findings and plan of care. PT and patient discuss plan of care, schedule, attendance policy and HEP  activities.  Supine therapist finger in the vaginal canal working on the pelvic floor, working on elongating  the vaginal canal with hip movements Neuromuscular re-education: Core facilitation: Plank against the wall while moving hands to engage the core Plank against the wall and march to work the [employer] push ups to work the core Standing hip rotation with core engaged and working on [employer] 12 times  Exercises: Stretches/mobility: Sit in chair and roll the ball forward and perform diaphragmatic breathing to open the back up and inner thigh area then roll the ball back and forth Sit in chair and hold ball over head and lean side to side and trunk rotation Doorway stretch to open up the anterior trunk Sitting hip abduction and lean forward to stretch the inner thigh Butterfly stretch  Strengthening: Therapeutic activities: Functional strengthening activities: Discussed with patient on using the ohnut or be on top to adapt to the shorter vaginal canal. This will help with managing her pain with penetration.      PATIENT EDUCATION: 03/30/23 Education details: Access Code: [email or web address] Person educated: Patient Education method: Explanation, Demonstration, Tactile cues, Verbal cues, and Handouts Education comprehension: verbalized understanding, returned demonstration,  verbal cues required, tactile cues required, and needs further education    HOME EXERCISE PROGRAM: 03/30/23 Access Code: [email or web address] URL: [email or web address] Date: 03/30/2023 Prepared by: Eulis Foster  Exercises - Seated Piriformis Stretch with Trunk Bend  - 1 x daily - 7 x weekly - 1 sets - 1 reps - 30 sec hold - Seated Hamstring Stretch  - 1 x daily - 7 x weekly - 1 sets - 1 reps - 30 sec hold - Seated Hip Adductor Stretch  - 1 x daily - 7 x weekly - 1 sets - 1 reps - 30 sec hold - Doorway Pec Stretch at 120 Degrees Abduction  - 1 x daily - 7 x weekly - 1 sets - 1 reps - 30 sec  hold   ASSESSMENT:  CLINICAL IMPRESSION: Patient is a 48 y.o. female who was seen today for physical therapy  treatment for pelvic pain. Patient reports she is able to hold her urine 2 hours now when she has the urgency. Patient reports her urinary leakage is 70% better. She is on the largest dilator. She is looking into getting the Ohnut to reduce the pain with intercourse. Pelvic floor strength is still 3/5. Patient will benefit from skilled therapy to improve lengthening of the vaginal tissue to reduce pain after penetration and improve strength to reduce leakage.   OBJECTIVE IMPAIRMENTS: decreased coordination, decreased strength, increased fascial restrictions, increased muscle spasms, and pain.   ACTIVITY LIMITATIONS: continence  PARTICIPATION LIMITATIONS: interpersonal relationship and community activity  PERSONAL FACTORS: 3+ comorbidities: hx of hypertension, preeclampsia, hyperlipidemia, OSA, diabetes, squamous cell carcinoma of cervix s/p radiation and chemotherapy, morbid obesity  are also affecting patient's functional outcome.   REHAB POTENTIAL: Excellent  CLINICAL DECISION MAKING: Evolving/moderate complexity  EVALUATION COMPLEXITY: Moderate   GOALS: Goals reviewed with patient? Yes  SHORT TERM GOALS: Target date: 04/13/23  Patient independent with initial flexibility HEP.  Baseline: not educated yet Goal status: Met 03/30/23   LONG TERM GOALS: Target date: 09/13/23  Patient independent with advanced HEP for core and pelvic floor strength.  Baseline: not educated yet Goal status: ongoing 05/13/23  2.  Patient understands ways to manage pelvic pain with penetration with the Ohnut, breathing techniques, warming techniques, and ways to relax the tissue.  Baseline: not educated yet.  Goal status: Met 04/20/23  3.  Pelvic floor strength is >/= 3/5 to reduce urinary leakage >/= 50% with coughing and sneezing.  Baseline: Pelvic strength 3/5 Goal status: Met  05/13/23  4.  Patient is able to have vaginal penetration with pain afterwards reduced >/= 4/10 due to lengthening of the tissue.  Baseline: pain level 7/10 Goal status: INITIAL   PLAN:  PT FREQUENCY: 1-2x/week  PT DURATION: 6 months  PLANNED INTERVENTIONS: 97110-Therapeutic exercises, 97530- Therapeutic activity, 97112- Neuromuscular re-education, 97140- Manual therapy, Patient/Family education, Dry Needling, Spinal mobilization, and Biofeedback  PLAN FOR NEXT SESSION: hip stretches for the inner thigh, manual work to the pelvic floor, core strength, see if she has had intercourse   Eulis Foster, PT 05/13/23 12:17 PM

## 2023-05-17 NOTE — Progress Notes (Signed)
 Name: Tamara Osborne DOB: Jan 21, 1976 MRN: [id number]  History of Present Illness: [name] is a 48 y.o. female who presents today as a new patient at [facility] [place]. All available relevant medical records have been reviewed.   Past medical history includes: 1. Cervical cancer.  - Patient states she wasn't a surgical candidate because cancer had spread outside her cervix. - Treated with external beam radiation, chemotherapy, and HDR via Tandem ring procedure x5 in 2021.  - Currently doing pelvic floor physical therapy for pelvic pain / dyspareunia / urinary incontinence. - Reports ongoing routine follow up with GYN, oncology, and radiology. 2. CKD. Followed by [employer]. 3. Strong family history of cancer. - Father had kidney cancer (deceased). - Mother had brain cancer (deceased). - Sister (alive) has ovarian cancer. - Patient is unsure if genetic testing has been performed.  Recent history: > 02/26/2023:  - Seen in ER; diagnosed with hemorrhagic cystitis. Treated with appropriate antibiotics.  - UA: positive for large leukocytes, moderate blood, no nitrites - Urine microscopy: >50 WBC/hpf, 21-50 RBC/hpf, few bacteria - Urine culture positive for Citrobacter koseri  > 03/09/2023:  - UA: positive for small 1+ leukocytes, moderate blood, no nitrites - Urine culture negative  > 04/23/2023:  - UA: positive for trace leukocytes, trace blood, no nitrites - Treated with Keflex for suspected UTI  - She reports that her urinary symptoms improved with antibiotic - Urine culture negative  > No recent / relevant imaging per chart review; patient confirmed.  Today:  She reports that her PCP referred her to Urology for recurrent UTIs despite only 1 positive urine culture in the past 6-12 months (as above).  She denies increased urinary urgency, frequency, nocturia, dysuria, gross hematuria, hesitancy, straining to void, or sensations of incomplete  emptying. She denies flank pain or abdominal pain.  She denies history of kidney stones.  She reports history of recent or recurrent UTI. She denies history of autoimmune disease. She denies history of smoking. She denies taking anticoagulants.  She reports being told she is in early menopause due to the effects of her cervical cancer treatments / radiation. Since then she reports having a narrowed vaginal introitus and dyspareunia. Denies being sexually active at this time. She is currently doing pelvic floor physical therapy and notes that has helped with her urinary urgency and mixed urinary incontinence. Reports vaginal spotting when she uses vaginal dilators. She is scheduled for follow up with her GYN provider on 06/10/2023.    Medications: Current Outpatient Medications  Medication Sig Dispense Refill   Accu-Chek Softclix Lancets lancets Use to check blood sugar TID. 100 each 2   amLODipine-valsartan (EXFORGE) 10-320 MG tablet Take 1 tablet by mouth daily. 90 tablet 3   azelastine (ASTELIN) 0.1 % nasal spray Place 1 spray into both nostrils 2 (two) times daily as needed. Use in each nostril as directed 30 mL 5   Blood Glucose Monitoring Suppl (ACCU-CHEK GUIDE) w/Device KIT Use to check blood sugar TID. 1 kit 0   Cholecalciferol 50 MCG (2000 UT) TABS Take by mouth.     EPINEPHrine (EPIPEN 2-PAK) 0.3 mg/0.3 mL IJ SOAJ injection Inject 0.3 mg into the muscle as needed for anaphylaxis. 2 each 1   fluticasone (FLONASE) 50 MCG/ACT nasal spray Place 2 sprays into both nostrils daily. 16 g 5   glucose blood (ACCU-CHEK GUIDE) test strip Use to check blood sugar TID. 100 each 2   ipratropium (ATROVENT) 0.06 % nasal spray  Place 2 sprays into both nostrils 3 (three) times daily as needed for rhinitis (drainage). 15 mL 5   levocetirizine (XYZAL) 5 MG tablet Take 1 tablet (5 mg total) by mouth every evening. 90 tablet 1   RESTASIS 0.05 % ophthalmic emulsion 1 drop 2 (two) times daily.      Semaglutide, 1 MG/DOSE, 4 MG/3ML SOPN Inject 1 mg as directed once a week. 3 mL 0   No current facility-administered medications for this visit.    Allergies: Allergies  Allergen Reactions   Penicillins     Not sure a child allergy   Shellfish Allergy Swelling    Seafood also any kind   Sulfa Antibiotics     Not sure a child allergy    Past Medical History:  Diagnosis Date   Anemia    Angio-edema    Anxiety    Bartholin cyst    Cervical cancer (HCC)    Chronic kidney disease    Constipation    Depression    Deviated septum    Diabetes mellitus without complication (HCC)    Difficult intravenous access    used port for 05-09-2019 surgery   Fibroids    History of blood transfusion    2 units given 04-26-2019, has had total of 8 units since jan 2021   History of blood transfusion 1 unit given 05-30-19   iv fluids also given   History of cervical cancer 07/16/2022   History of kidney problems    History of radiation therapy 03/23/2019-05/04/2019   Cervical external beam   Dr Antony Blackbird   History of radiation therapy 05/09/2019-06/05/2019   vaginal brachytherapy   Dr Antony Blackbird   History of recent blood transfusion 05/16/2019   2 units given  per dr Tamara Osborne at cancer center   Hypertension    Neuropathy    both thumbs   Obesity    Palpitations    PONV (postoperative nausea and vomiting)    Prediabetes    Preeclampsia 2006   Sleep apnea    no cpap used insurance would not cover, osa severe per pt   SOB (shortness of breath)    Squamous cell carcinoma of cervix (HCC) 03/23/2019   Swallowing difficulty    Symptomatic skin lesion 07/14/2022   Vitamin D deficiency 01/10/2020   Past Surgical History:  Procedure Laterality Date   CESAREAN SECTION WITH BILATERAL TUBAL LIGATION  11/10/2004       DILATION AND CURETTAGE OF UTERUS  1997   IR IMAGING GUIDED PORT INSERTION  04/04/2019   IR REMOVAL TUN ACCESS W/ PORT W/O FL MOD SED  10/27/2021   OPERATIVE ULTRASOUND N/A 05/09/2019    Procedure: OPERATIVE ULTRASOUND;  Surgeon: Antony Blackbird, MD;  Location[facility] [place];  Service: Urology;  Laterality: N/A;   OPERATIVE ULTRASOUND N/A 05/15/2019   Procedure: OPERATIVE ULTRASOUND;  Surgeon: Antony Blackbird, MD;  Location: [facility];  Service: Urology;  Laterality: N/A;   OPERATIVE ULTRASOUND N/A 05/22/2019   Procedure: OPERATIVE ULTRASOUND;  Surgeon: Antony Blackbird, MD;  Location: [facility];  Service: Urology;  Laterality: N/A;   OPERATIVE ULTRASOUND N/A 05/29/2019   Procedure: OPERATIVE ULTRASOUND;  Surgeon: Antony Blackbird, MD;  Location: [facility];  Service: Urology;  Laterality: N/A;   OPERATIVE ULTRASOUND N/A 06/05/2019   Procedure: OPERATIVE ULTRASOUND;  Surgeon: Antony Blackbird, MD;  Location: [facility];  Service: Urology;  Laterality: N/A;   TANDEM RING INSERTION N/A 05/09/2019  Procedure: TANDEM RING INSERTION;  Surgeon: Antony Blackbird, MD;  Location: [facility];  Service: Urology;  Laterality: N/A;   TANDEM RING INSERTION N/A 05/15/2019   Procedure: TANDEM RING INSERTION;  Surgeon: Antony Blackbird, MD;  Location: [facility];  Service: Urology;  Laterality: N/A;   TANDEM RING INSERTION N/A 05/22/2019   Procedure: TANDEM RING INSERTION;  Surgeon: Antony Blackbird, MD;  Location: [facility];  Service: Urology;  Laterality: N/A;   TANDEM RING INSERTION N/A 05/29/2019   Procedure: TANDEM RING INSERTION;  Surgeon: Antony Blackbird, MD;  Location: [facility];  Service: Urology;  Laterality: N/A;   TANDEM RING INSERTION N/A 06/05/2019   Procedure: TANDEM RING INSERTION;  Surgeon: Antony Blackbird, MD;  Location: [facility];  Service: Urology;  Laterality: N/A;   Family History  Problem Relation Age of Onset   Brain cancer Mother        lung cancer to brain   Hypertension Mother    Cancer Mother    Depression Mother    Anxiety disorder Mother     Diabetes Father    Kidney disease Father    Cancer Father        kidney cancer   Heart failure Sister    Heart attack Sister    Heart failure Paternal Aunt    Cancer Maternal Grandmother    Kidney failure Maternal Grandfather    Social History   Socioeconomic History   Marital status: Single    Spouse name: Not on file   Number of children: 4   Years of education: Not on file   Highest education level: Not on file  Occupational History   Not on file  Tobacco Use   Smoking status: Never   Smokeless tobacco: Never  Vaping Use   Vaping status: Never Used  Substance and Sexual Activity   Alcohol use: No   Drug use: No   Sexual activity: Not Currently    Birth control/protection: Surgical  Other Topics Concern   Not on file  Social History Narrative   Lives with her boyfriend   Social Drivers of Health   Financial Resource Strain: High Risk (04/23/2023)   Overall Financial Resource Strain (CARDIA)    Difficulty of Paying Living Expenses: Very hard  Food Insecurity: No Food Insecurity (10/08/2022)   Hunger Vital Sign    Worried About Running Out of Food in the Last Year: Never true    Ran Out of Food in the Last Year: Never true  Transportation Needs: No Transportation Needs (10/08/2022)   PRAPARE - Administrator, Civil Service (Medical): No    Lack of Transportation (Non-Medical): No  Physical Activity: Insufficiently Active (10/08/2022)   Exercise Vital Sign    Days of Exercise per Week: 2 days    Minutes of Exercise per Session: 40 min  Stress: No Stress Concern Present (04/23/2023)   [employer] of Occupational Health - Occupational Stress Questionnaire    Feeling of Stress : Not at all  Social Connections: Socially Isolated (04/23/2023)   Social Connection and Isolation Panel [NHANES]    Frequency of Communication with Friends and Family: More than three times a week    Frequency of Social Gatherings with Friends and Family: Once a week     Attends Religious Services: Never    Database administrator or Organizations: No    Attends Banker Meetings: Never    Marital Status: Never married  Intimate Partner Violence: Not At Risk (04/23/2023)   Humiliation, Afraid, Rape, and Kick questionnaire    Fear of Current or Ex-Partner: No    Emotionally Abused: No    Physically Abused: No    Sexually Abused: No    SUBJECTIVE  Review of Systems Constitutional: Patient denies any unintentional weight loss or change in strength lntegumentary: Patient denies any rashes or pruritus Cardiovascular: Patient denies chest pain or syncope Respiratory: Patient denies shortness of breath Gastrointestinal: Denies constipation / straining to defecate Musculoskeletal: Patient denies muscle cramps or weakness Neurologic: Patient denies convulsions or seizures Allergic/Immunologic: Patient denies recent allergic reaction(s) Hematologic/Lymphatic: Patient denies bleeding tendencies Endocrine: Patient denies heat/cold intolerance  GU: As per HPI.  OBJECTIVE Vitals:   05/21/23 1123  BP: (!) 152/91  Pulse: 91   There is no height or weight on file to calculate BMI.  Physical Examination Constitutional: No obvious distress; patient is non-toxic appearing  Cardiovascular: No visible lower extremity edema.  Respiratory: The patient does not have audible wheezing/stridor; respirations do not appear labored  Gastrointestinal: Abdomen non-distended Musculoskeletal: Normal ROM of UEs  Skin: No obvious rashes/open sores  Neurologic: CN 2-12 grossly intact Psychiatric: Answered questions appropriately with normal affect  Hematologic/Lymphatic/Immunologic: No obvious bruises or sites of spontaneous bleeding  Urine microscopy: >30 WBC/hpf, 3-10 RBC/hpf, few bacteria PVR: 0 ml  ASSESSMENT History of UTI - Plan: BLADDER SCAN AMB NON-IMAGING, Urinalysis, Routine w reflex microscopic, Cytology, urine, CT HEMATURIA WORKUP  Abnormal  urinalysis - Plan: Cytology, urine, CT HEMATURIA WORKUP  Gross hematuria - Plan: Cytology, urine, CT HEMATURIA WORKUP  Lower urinary tract symptoms (LUTS) - Plan: Cytology, urine, CT HEMATURIA WORKUP  History of cervical cancer - Plan: Cytology, urine, CT HEMATURIA WORKUP  History of radiation therapy - Plan: Cytology, urine, CT HEMATURIA WORKUP  Dyspareunia in female  Difficult intravenous access - Plan: Cytology, urine, CT HEMATURIA WORKUP  Squamous cell carcinoma of cervix (HCC) - Plan: Cytology, urine, CT HEMATURIA WORKUP  For management of gross hematuria, we discussed possible etiologies including but not limited to: vigorous exercise, sexual activity, stone, trauma, blood thinner use, urinary tract infection, kidney function, radiation cystitis, malignancy.   We discussed the importance of work-up including assessing the upper and lower GU tract with CT hematuria protocol and cystoscopy. We will also check MDX urine culture and voided cytology.  She verbalized understanding and decided to pursue this work-up. Patient agreed to follow-up afterward to discuss the results and formulate a treatment plan based on the findings. All questions were answered.   PLAN Advised the following: 1. MDX urine culture. 2. Voided cytology. 3. CT hematuria. 4. Return for 1st available cystoscopy with any urology MD.  Orders Placed This Encounter  Procedures   CT HEMATURIA WORKUP    Standing Status:   Future    Expiration Date:   05/20/2024    Reason for Exam (SYMPTOM  OR DIAGNOSIS REQUIRED):   Gross hematuria    Is patient pregnant?:   No    Preferred imaging location?:   [facility]   Urinalysis, Routine w reflex microscopic   BLADDER SCAN AMB NON-IMAGING    It has been explained that the patient is to follow regularly with their PCP in addition to all other providers involved in their care and to follow instructions provided by these respective offices. Patient advised to  contact urology clinic if any urologic-pertaining questions, concerns, new symptoms or problems arise in the interim period.  Patient Instructions  UTI prevention /  management:  Difference between Urinalysis (urine dipstick test) and Urine culture:  Urinalysis (urine dipstick test): A quick office test used as an indicator to determine whether or not further testing is necessary (such as a urine culture, urine microscopy, etc.) The urinalysis cannot differentiate a true bacterial UTI or give a definitive diagnosis for the findings.  Urine culture: May be performed based on the findings of a urinalysis to evaluate for UTI. Grows out on a petri dish for 48-72 hours. Provides important information about: whether or not bacterial growth is present and if so: what the predominant bacteria is which antibiotics will work best against that bacteria That information is important so that we can diagnose and treat patients appropriately as there are other conditions which may mimic UTls which must not be missed (such as cancer, interstitial cystitis, stones, etc.). Assists [place] with antibiotic stewardship to minimize patient's risk for developing antibiotic resistance (getting to a point where no antibiotics work anymore).  Options when UTI symptoms occur: 1. Call [facility] [place] and request to speak with triage nurse (phone # [phone], select option 3). In accordance with clinic guidelines the nurse will determine next steps based on patient-reported symptoms, which may include: same-day lab visit to provide urine specimen, recommendation to schedule Urology office visit appointment for further evaluation, recommendation to proceed to ER, etc. 2. Call your Primary Care Provider (PCP) office to request urgent / same-day visit. Be sure to request for urine culture to be ordered and have results faxed to Urology (fax # [phone]).  3. Go to urgent care. Be sure to request for urine  culture to be ordered and have results faxed to Urology (fax # [phone]).   For bladder pain/ burning with urination: - Can take over-the-counter Pyridium (phenazopyridine; commonly known under the "AZO" brand) for a few days as needed. Limit use to no more than 3 days consecutively due to risk for methemoglobinemia, liver function issues, and bone health damage with long term use of Pyridium. - Alternative: Prescription urinary analgesics (such as Uribel, Urogesic blue, Urelle, Uro-MP). Often expensive / poorly covered by insurance unfortunately.  Options / recommendations for UTI prevention: - Adequate daily fluid intake to flush out the urinary tract. - Go to the bathroom to urinate every 4-6 hours while awake to minimize urinary stasis / bacterial overgrowth in the bladder. - Proanthocyanidin (PAC) supplement 36 mg daily; must be soluble (insoluble form of PAC will be ineffective). Recommended brand: Ellura. This is an over-the-counter supplement (often must be found/ purchased online) supplement derived from cranberries with concentrated active component: Proanthocyanidin (PAC) 36 mg daily. Decreases bacterial adherence to bladder lining.  - D-mannose powder (2 grams daily). This is an over-the-counter supplement which decreases bacterial adherence to bladder lining (it is a sugar that inhibits bacterial adherence to urothelial cells by binding to the pili of enteric bacteria). Take as per manufacturer recommendation. Can be used as an alternative or in addition to the concentrated cranberry supplement.  - Vitamin C supplement to acidify urine to minimize bacterial growth.  - Probiotic to maintain healthy vaginal microbiome to suppress bacteria at urethral opening. Brand recommendations: Darrold Junker (includes probiotic & D-mannose ), Feminine Balance (highest concentration of lactobacillus) or Hyperbiotic Pro 15.  Note for patients with diabetes:  - Be aware that D-mannose contains sugar.  Note  for patients with interstitial cystitis (IC):  - Patients with IC should typically avoid cranberry/ PAC supplements and Vitamin C supplements due to their acidity, which may exacerbate  IC-related bladder pain. - Symptoms of true bacterial UTI can overlap / mimic symptoms of an IC flare up. Antibiotic use is NOT indicated for IC flare ups. Urine culture needed prior to antibiotic treatment for IC patients. The goal is to minimize your risk for developing antibiotic-resistant bacteria.    Electronically signed by:  Donnita Falls, MSN, FNP-C, CUNP 05/21/2023 12:29 PM

## 2023-05-21 ENCOUNTER — Ambulatory Visit: Payer: Medicaid Other | Admitting: Urology

## 2023-05-21 ENCOUNTER — Encounter: Payer: Self-pay | Admitting: Urology

## 2023-05-21 VITALS — BP 152/91 | HR 91

## 2023-05-21 DIAGNOSIS — Z8041 Family history of malignant neoplasm of ovary: Secondary | ICD-10-CM

## 2023-05-21 DIAGNOSIS — Z87440 Personal history of urinary (tract) infections: Secondary | ICD-10-CM | POA: Diagnosis not present

## 2023-05-21 DIAGNOSIS — Z8541 Personal history of malignant neoplasm of cervix uteri: Secondary | ICD-10-CM | POA: Insufficient documentation

## 2023-05-21 DIAGNOSIS — R399 Unspecified symptoms and signs involving the genitourinary system: Secondary | ICD-10-CM | POA: Diagnosis not present

## 2023-05-21 DIAGNOSIS — R8290 Unspecified abnormal findings in urine: Secondary | ICD-10-CM

## 2023-05-21 DIAGNOSIS — C539 Malignant neoplasm of cervix uteri, unspecified: Secondary | ICD-10-CM

## 2023-05-21 DIAGNOSIS — R310 Gross hematuria: Secondary | ICD-10-CM

## 2023-05-21 DIAGNOSIS — Z808 Family history of malignant neoplasm of other organs or systems: Secondary | ICD-10-CM

## 2023-05-21 DIAGNOSIS — Z789 Other specified health status: Secondary | ICD-10-CM | POA: Insufficient documentation

## 2023-05-21 DIAGNOSIS — N941 Unspecified dyspareunia: Secondary | ICD-10-CM

## 2023-05-21 DIAGNOSIS — Z8051 Family history of malignant neoplasm of kidney: Secondary | ICD-10-CM

## 2023-05-21 DIAGNOSIS — Z923 Personal history of irradiation: Secondary | ICD-10-CM | POA: Insufficient documentation

## 2023-05-21 LAB — MICROSCOPIC EXAMINATION: WBC, UA: >30 /HPF — AB (ref 0–5)

## 2023-05-21 LAB — URINALYSIS, ROUTINE W REFLEX MICROSCOPIC
Bilirubin, UA: NEGATIVE
Glucose, UA: NEGATIVE
Ketones, UA: NEGATIVE
Nitrite, UA: NEGATIVE
Specific Gravity, UA: 1.03 (ref 1.005–1.030)
Urobilinogen, Ur: 0.2 mg/dL (ref 0.2–1.0)
pH, UA: 6 (ref 5.0–7.5)

## 2023-05-21 LAB — BLADDER SCAN AMB NON-IMAGING: Scan Result: 0

## 2023-05-21 NOTE — Progress Notes (Signed)
 MDX culture [id number]

## 2023-05-21 NOTE — Patient Instructions (Signed)
 UTI prevention / management:  Difference between Urinalysis (urine dipstick test) and Urine culture:  Urinalysis (urine dipstick test): A quick office test used as an indicator to determine whether or not further testing is necessary (such as a urine culture, urine microscopy, etc.) The urinalysis cannot differentiate a true bacterial UTI or give a definitive diagnosis for the findings.  Urine culture: May be performed based on the findings of a urinalysis to evaluate for UTI. Grows out on a petri dish for 48-72 hours. Provides important information about: whether or not bacterial growth is present and if so: what the predominant bacteria is which antibiotics will work best against that bacteria That information is important so that we can diagnose and treat patients appropriately as there are other conditions which may mimic UTls which must not be missed (such as cancer, interstitial cystitis, stones, etc.). Assists [place] with antibiotic stewardship to minimize patient's risk for developing antibiotic resistance (getting to a point where no antibiotics work anymore).  Options when UTI symptoms occur: 1. Call [facility] [place] and request to speak with triage nurse (phone # [phone], select option 3). In accordance with clinic guidelines the nurse will determine next steps based on patient-reported symptoms, which may include: same-day lab visit to provide urine specimen, recommendation to schedule Urology office visit appointment for further evaluation, recommendation to proceed to ER, etc. 2. Call your Primary Care Provider (PCP) office to request urgent / same-day visit. Be sure to request for urine culture to be ordered and have results faxed to Urology (fax # [phone]).  3. Go to urgent care. Be sure to request for urine culture to be ordered and have results faxed to Urology (fax # [phone]).   For bladder pain/ burning with urination: - Can take over-the-counter  Pyridium (phenazopyridine; commonly known under the "AZO" brand) for a few days as needed. Limit use to no more than 3 days consecutively due to risk for methemoglobinemia, liver function issues, and bone health damage with long term use of Pyridium. - Alternative: Prescription urinary analgesics (such as Uribel, Urogesic blue, Urelle, Uro-MP). Often expensive / poorly covered by insurance unfortunately.  Options / recommendations for UTI prevention: - Adequate daily fluid intake to flush out the urinary tract. - Go to the bathroom to urinate every 4-6 hours while awake to minimize urinary stasis / bacterial overgrowth in the bladder. - Proanthocyanidin (PAC) supplement 36 mg daily; must be soluble (insoluble form of PAC will be ineffective). Recommended brand: Ellura. This is an over-the-counter supplement (often must be found/ purchased online) supplement derived from cranberries with concentrated active component: Proanthocyanidin (PAC) 36 mg daily. Decreases bacterial adherence to bladder lining.  - D-mannose powder (2 grams daily). This is an over-the-counter supplement which decreases bacterial adherence to bladder lining (it is a sugar that inhibits bacterial adherence to urothelial cells by binding to the pili of enteric bacteria). Take as per manufacturer recommendation. Can be used as an alternative or in addition to the concentrated cranberry supplement.  - Vitamin C supplement to acidify urine to minimize bacterial growth.  - Probiotic to maintain healthy vaginal microbiome to suppress bacteria at urethral opening. Brand recommendations: Darrold Junker (includes probiotic & D-mannose ), Feminine Balance (highest concentration of lactobacillus) or Hyperbiotic Pro 15.  Note for patients with diabetes:  - Be aware that D-mannose contains sugar.  Note for patients with interstitial cystitis (IC):  - Patients with IC should typically avoid cranberry/ PAC supplements and Vitamin C supplements due to  their acidity,  which may exacerbate IC-related bladder pain. - Symptoms of true bacterial UTI can overlap / mimic symptoms of an IC flare up. Antibiotic use is NOT indicated for IC flare ups. Urine culture needed prior to antibiotic treatment for IC patients. The goal is to minimize your risk for developing antibiotic-resistant bacteria.

## 2023-05-24 ENCOUNTER — Telehealth: Payer: Self-pay

## 2023-05-24 ENCOUNTER — Encounter: Payer: Self-pay | Admitting: Urology

## 2023-05-24 LAB — CYTOLOGY, URINE

## 2023-05-24 NOTE — Telephone Encounter (Signed)
 Patient was made aware and voiced understanding.

## 2023-05-24 NOTE — Telephone Encounter (Signed)
-----   Message from Donnita Falls sent at 05/24/2023  1:51 PM EDT ----- Please let pt know that the MDX urine culture was negative for significant bacterial growth. No antibiotic indicated. ----- Message ----- From: Berle Mull Sent: 05/24/2023   1:47 PM EDT To: Donnita Falls, FNP

## 2023-05-27 ENCOUNTER — Encounter: Payer: Self-pay | Admitting: Physical Therapy

## 2023-05-27 ENCOUNTER — Telehealth: Payer: Self-pay

## 2023-05-27 DIAGNOSIS — R102 Pelvic and perineal pain: Secondary | ICD-10-CM

## 2023-05-27 DIAGNOSIS — M6281 Muscle weakness (generalized): Secondary | ICD-10-CM

## 2023-05-27 NOTE — Therapy (Signed)
 OUTPATIENT PHYSICAL THERAPY FEMALE PELVIC TREATMENT   Patient Name: Tamara Osborne MRN: [id number] DOB:[birth year], 48 y.o., female Today's Date: 05/27/2023  END OF SESSION:  PT End of Session - 05/27/23 1134     Visit Number 9    Date for PT Re-Evaluation 09/13/23    Authorization Type [employer]    Authorization Time Period 03/16/2023 - 06/13/2023    Authorization - Visit Number 8    Authorization - Number of Visits 11    PT Start Time 1130    PT Stop Time 1215    PT Time Calculation (min) 45 min    Activity Tolerance Patient tolerated treatment well    Behavior During Therapy WFL for tasks assessed/performed             Past Medical History:  Diagnosis Date   Anemia    Angio-edema    Anxiety    Bartholin cyst    Cervical cancer (HCC)    Chronic kidney disease    Constipation    Depression    Deviated septum    Diabetes mellitus without complication (HCC)    Difficult intravenous access    used port for 05-09-2019 surgery   Fibroids    History of blood transfusion    2 units given 04-26-2019, has had total of 8 units since jan 2021   History of blood transfusion 1 unit given 05-30-19   iv fluids also given   History of cervical cancer 07/16/2022   History of kidney problems    History of radiation therapy 03/23/2019-05/04/2019   Cervical external beam   Dr Antony Blackbird   History of radiation therapy 05/09/2019-06/05/2019   vaginal brachytherapy   Dr Antony Blackbird   History of recent blood transfusion 05/16/2019   2 units given  per dr Lambert Mody at cancer center   Hypertension    Neuropathy    both thumbs   Obesity    Palpitations    PONV (postoperative nausea and vomiting)    Prediabetes    Preeclampsia 2006   Sleep apnea    no cpap used insurance would not cover, osa severe per pt   SOB (shortness of breath)    Squamous cell carcinoma of cervix (HCC) 03/23/2019   Swallowing difficulty    Symptomatic skin lesion 07/14/2022   Vitamin D deficiency 01/10/2020    Past Surgical History:  Procedure Laterality Date   CESAREAN SECTION WITH BILATERAL TUBAL LIGATION  11/10/2004       DILATION AND CURETTAGE OF UTERUS  1997   IR IMAGING GUIDED PORT INSERTION  04/04/2019   IR REMOVAL TUN ACCESS W/ PORT W/O FL MOD SED  10/27/2021   OPERATIVE ULTRASOUND N/A 05/09/2019   Procedure: OPERATIVE ULTRASOUND;  Surgeon: Antony Blackbird, MD;  Location: [facility];  Service: Urology;  Laterality: N/A;   OPERATIVE ULTRASOUND N/A 05/15/2019   Procedure: OPERATIVE ULTRASOUND;  Surgeon: Antony Blackbird, MD;  Location: [facility];  Service: Urology;  Laterality: N/A;   OPERATIVE ULTRASOUND N/A 05/22/2019   Procedure: OPERATIVE ULTRASOUND;  Surgeon: Antony Blackbird, MD;  Location: [facility];  Service: Urology;  Laterality: N/A;   OPERATIVE ULTRASOUND N/A 05/29/2019   Procedure: OPERATIVE ULTRASOUND;  Surgeon: Antony Blackbird, MD;  Location: [facility];  Service: Urology;  Laterality: N/A;   OPERATIVE ULTRASOUND N/A 06/05/2019   Procedure: OPERATIVE ULTRASOUND;  Surgeon: Antony Blackbird, MD;  Location: [facility];  Service: Urology;  Laterality: N/A;   TANDEM  RING INSERTION N/A 05/09/2019   Procedure: TANDEM RING INSERTION;  Surgeon: Antony Blackbird, MD;  Location: [facility];  Service: Urology;  Laterality: N/A;   TANDEM RING INSERTION N/A 05/15/2019   Procedure: TANDEM RING INSERTION;  Surgeon: Antony Blackbird, MD;  Location: [facility];  Service: Urology;  Laterality: N/A;   TANDEM RING INSERTION N/A 05/22/2019   Procedure: TANDEM RING INSERTION;  Surgeon: Antony Blackbird, MD;  Location: [facility];  Service: Urology;  Laterality: N/A;   TANDEM RING INSERTION N/A 05/29/2019   Procedure: TANDEM RING INSERTION;  Surgeon: Antony Blackbird, MD;  Location: [facility];  Service: Urology;  Laterality: N/A;   TANDEM RING INSERTION N/A 06/05/2019   Procedure: TANDEM RING  INSERTION;  Surgeon: Antony Blackbird, MD;  Location: [facility];  Service: Urology;  Laterality: N/A;   Patient Active Problem List   Diagnosis Date Noted   History of radiation therapy 05/21/2023   Difficult intravenous access 05/21/2023   HSV-2 infection 07/13/2022   Dyspareunia in female 10/23/2021   Hyperlipidemia associated with type 2 diabetes mellitus (HCC) 07/03/2021   OSA (obstructive sleep apnea) 07/31/2020   Other hyperlipidemia 01/10/2020   Insulin resistance 01/10/2020   Class 2 severe obesity with serious comorbidity and body mass index (BMI) of 39.0 to 39.9 in adult [facility]) 01/10/2020   Peripheral 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) 04/25/2019   CKD (chronic kidney disease), symptom management only, stage 3 (moderate) (HCC) 03/30/2019   Generalized anxiety disorder 03/30/2019   Hypertension associated with type 2 diabetes mellitus (HCC)    Squamous cell carcinoma of cervix (HCC) 03/23/2019   Deficiency anemia 03/22/2019    PCP: Alyson Reedy, FNP  REFERRING PROVIDER:  C53.9 (ICD-10-CM) - Squamous cell carcinoma of cervix (HCC)   REFERRING DIAG: Antony Blackbird, MD   THERAPY DIAG:  Muscle weakness (generalized)  Pelvic pain  Rationale for Evaluation and Treatment: Rehabilitation  ONSET DATE: 11/2022   SUBJECTIVE:                                                                                                                                                                                           SUBJECTIVE STATEMENT: I am going to have a CAT scan of pelvis and catheter in the bladder to look at the bladder. We had intercourse and used the Ohnut with lubricant and had some pain.  Fluid intake: Yes: water    PAIN:  Are you having pain? Yes NPRS scale: 4/10 Pain location: Vaginal  Pain type: sharp and soreness that can extend for several days  Pain description: intermittent   Aggravating  factors: vaginal penetration Relieving factors:  time  PRECAUTIONS: Other: cancer with radiation and chemotherapy  RED FLAGS: None   WEIGHT BEARING RESTRICTIONS: No  FALLS:  Has patient fallen in last 6 months? 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    BOWEL MOVEMENT: no issues   URINATION: Pain with urination: No Fully empty bladder: Yes:   Stream: Strong Urgency: Yes:   Frequency: every 3 hours, does not wake up during the night Leakage: Coughing and Sneezing Pads: Yes: when out of home  INTERCOURSE: Pain with intercourse: After Intercourse Ability to have vaginal penetration:  Yes:   Climax: not able to [employer] Scale: 1/3  PREGNANCY: Vaginal deliveries 1 Tearing Yes:    PROLAPSE: None   OBJECTIVE:  Note: Objective measures were completed at Evaluation unless otherwise noted.   PATIENT SURVEYS:  POPIQ-7 38 PFIQ-7 38  COGNITION: Overall cognitive status: Within functional limits for tasks assessed     SENSATION: Light touch: Appears intact Proprioception: Appears intact    POSTURE: rounded shoulders, forward head, and decreased lumbar lordosis  PELVIC ALIGNMENT:correct alignment  LUMBARAROM/PROM: lumbar ROM decreased by 50% 05/06/23: lumbar extension decreased by 50%, bilateral sidebending decreased by 25%   LOWER EXTREMITY ZOX:WRUE ROM in bilateral hips.  Tightness in the hip adductors.     LOWER EXTREMITY MMT: bilateral hip strength is 5/5   PALPATION:   General  Difficulty contracting abdominals and performing diaphragmatic breathing.                 External Perineal Exam tightness in the urogenital diaphragm, tenderness located in the levator ani and perineal body                              Internal Pelvic Floor tightness throughout the pelvic floor muscles, her vaginal canal is shortened  Patient confirms identification and approves PT to assess internal pelvic floor and treatment Yes  PELVIC MMT:   MMT eval 03/30/23 04/06/23 04/14/23 04/20/23 05/13/23  Vaginal 2/5 3/5 3/5 10 sec 3/5 3/5 3/5  (Blank rows = not tested)        TONE: increased    TODAY'S TREATMENT:   05/27/23 Exercises: Strengthening: Sitting with rolling ball on ground side to side to increase lumbar flexion and engage the obliques and breathing into the back to expand the rib cage Sitting bringing ball overhead to work core 10 x  Hold ball overhead in standing and bring side to side to work on ROM and strength Wall squats hold for 5 sec 20 x  Rolling ball up wall and lean into it for elongation of anterior trunk and strength 20 x  Bilateral shoulder extension with blue tube band 15 x  Standing pull blue tube down to engage the abdominals 15 x  Standing pulling blue band across body 15 x each way  05/13/23 Manual: Internal pelvic floor techniques: No emotional/communication barriers or cognitive limitation. Patient is motivated to learn. Patient understands and agrees with treatment goals and plan. PT explains patient will be examined in standing, sitting, and lying down to see how their muscles and joints work. When they are ready, they will be asked to remove their underwear  so PT can examine their perineum. The patient is also given the option of providing their own chaperone as one is not provided in our facility. The patient also has the right and is explained the right to defer or refuse any part of the evaluation or treatment including the internal exam. With the patient's consent, PT will use one gloved finger to gently assess the muscles of the pelvic floor, seeing how well it contracts and relaxes and if there is muscle symmetry. After, the patient will get dressed and PT and patient will  discuss exam findings and plan of care. PT and patient discuss plan of care, schedule, attendance policy and HEP activities.  Going through the vaginal canal working around the bladder, sides of the urethra, working on elongating the canal, working on the [employer] and obturator internist Exercises: Stretches/mobility: Sitting with rolling ball on ground side to side to increase lumbar flexion and engage the obliques Sitting lifting physioball overhead then back down to work on ROM and strength Hold ball overhead in sitting and bring side to side to work on ROM and strength Strengthening: Press into the foam roll and engage the abdominals     05/06/23 Manual: Spinal mobilization: PA and rotational mobilization to T8-L5 grade 3 to increase extension  Neuromuscular re-education: Form correction: Quadruped with hands on the ball and roll forward to stretch her back and arms  then back to contract the abdomen Sitting with rolling ball on ground side to side to increase lumbar flexion and engage the obliques Plank on wall and lift opposite extremity to increase back strength and stretch anterior trunk Plank on wall and alternate hip abduction working on trunk control Wall squats with ball to improve hip mobility Exercises: Stretches/mobility: Prone on elbows holding 2 minutes Prone press ups to increase extension Prone stretch bilateral hip rotators with therapist assistance Prone quadriceps stretch with therapist assistance  Strengthening: Prone with foot on the mat and extend the leg working on gluteal contraction       PATIENT EDUCATION: 03/30/23 Education details: Access Code: [email or web address] Person educated: Patient Education method: Explanation, Demonstration, Actor cues, Verbal cues, and Handouts Education comprehension: verbalized understanding, returned demonstration, verbal cues required, tactile cues required, and needs further education    HOME EXERCISE  PROGRAM: 03/30/23 Access Code: [email or web address] URL: [email or web address] Date: 03/30/2023 Prepared by: Eulis Foster  Exercises - Seated Piriformis Stretch with Trunk Bend  - 1 x daily - 7 x weekly - 1 sets - 1 reps - 30 sec hold - Seated Hamstring Stretch  - 1 x daily - 7 x weekly - 1 sets - 1 reps - 30 sec hold - Seated Hip Adductor Stretch  - 1 x daily - 7 x weekly - 1 sets - 1 reps - 30 sec hold - Doorway Pec Stretch at 120 Degrees Abduction  - 1 x daily - 7 x weekly - 1 sets - 1 reps - 30 sec hold   ASSESSMENT:  CLINICAL IMPRESSION: Patient is a 48 y.o. female who was seen today for physical therapy  treatment for pelvic pain.Patient had intercourse with pain level 4/10 compared to 7/10 and she used the Ohnut. Patient reports her urinary leakage has improved. She is working on core strength. She is using the largest dilator vaginally. Patient will benefit from skilled therapy to improve lengthening of the vaginal tissue to reduce pain after penetration and improve strength to reduce leakage.   OBJECTIVE IMPAIRMENTS: decreased coordination, decreased strength, increased fascial restrictions, increased  muscle spasms, and pain.   ACTIVITY LIMITATIONS: continence  PARTICIPATION LIMITATIONS: interpersonal relationship and community activity  PERSONAL FACTORS: 3+ comorbidities: hx of hypertension, preeclampsia, hyperlipidemia, OSA, diabetes, squamous cell carcinoma of cervix s/p radiation and chemotherapy, morbid obesity  are also affecting patient's functional outcome.   REHAB POTENTIAL: Excellent  CLINICAL DECISION MAKING: Evolving/moderate complexity  EVALUATION COMPLEXITY: Moderate   GOALS: Goals reviewed with patient? Yes  SHORT TERM GOALS: Target date: 04/13/23  Patient independent with initial flexibility HEP.  Baseline: not educated yet Goal status: Met 03/30/23   LONG TERM GOALS: Target date: 09/13/23  Patient independent with advanced HEP for core and pelvic  floor strength.  Baseline: not educated yet Goal status: ongoing 05/13/23  2.  Patient understands ways to manage pelvic pain with penetration with the Ohnut, breathing techniques, warming techniques, and ways to relax the tissue.  Baseline: not educated yet.  Goal status: Met 04/20/23  3.  Pelvic floor strength is >/= 3/5 to reduce urinary leakage >/= 50% with coughing and sneezing.  Baseline: Pelvic strength 3/5 Goal status: Met 05/13/23  4.  Patient is able to have vaginal penetration with pain afterwards reduced >/= 4/10 due to lengthening of the tissue.  Baseline: pain level 4/10 Goal status: Met 05/27/23   PLAN:  PT FREQUENCY: 1-2x/week  PT DURATION: 6 months  PLANNED INTERVENTIONS: 97110-Therapeutic exercises, 97530- Therapeutic activity, 97112- Neuromuscular re-education, 97140- Manual therapy, Patient/Family education, Dry Needling, Spinal mobilization, and Biofeedback  PLAN FOR NEXT SESSION: update HEP and possible discharge  Eulis Foster, PT 05/27/23 12:24 PM

## 2023-05-27 NOTE — Telephone Encounter (Signed)
 Sent insurance card and ID information to [facility] cc: Geanie Kenning

## 2023-05-31 ENCOUNTER — Ambulatory Visit: Admitting: Podiatry

## 2023-05-31 ENCOUNTER — Telehealth: Payer: Self-pay

## 2023-05-31 ENCOUNTER — Ambulatory Visit (INDEPENDENT_AMBULATORY_CARE_PROVIDER_SITE_OTHER)

## 2023-05-31 DIAGNOSIS — M7752 Other enthesopathy of left foot: Secondary | ICD-10-CM

## 2023-05-31 MED ORDER — DEXAMETHASONE SODIUM PHOSPHATE 120 MG/30ML IJ SOLN
4.0000 mg | Freq: Once | INTRAMUSCULAR | Status: AC
  Administered 2023-05-31: 4 mg via INTRA_ARTICULAR

## 2023-05-31 MED ORDER — TRIAMCINOLONE ACETONIDE 10 MG/ML IJ SUSP
2.5000 mg | Freq: Once | INTRAMUSCULAR | Status: AC
  Administered 2023-05-31: 2.5 mg via INTRA_ARTICULAR

## 2023-05-31 NOTE — Progress Notes (Signed)
 Subjective:  Patient ID: Tamara Osborne, female    DOB: [birth year],   MRN: [id number]  No chief complaint on file.   48 y.o. female presents for new concern of left foot pain that has been ongoing for about a week and is worsening. She is no longer able to bear weight on the foot. Relates it initially felt like a bruise and has been worsening. She denies history of gout. She is diabetic and has a history of chemo that caused neruopathy. Only thing that has helped is some coconut oil and epsom salt soaks. Patient is diabetic and last A1c was  Lab Results  Component Value Date   HGBA1C 5.9 (H) 08/06/2022   .   PCP:  Hilbert Bible, FNP    . Denies any other pedal complaints. Denies n/v/f/c.   Past Medical History:  Diagnosis Date   Anemia    Angio-edema    Anxiety    Bartholin cyst    Cervical cancer (HCC)    Chronic kidney disease    Constipation    Depression    Deviated septum    Diabetes mellitus without complication (HCC)    Difficult intravenous access    used port for 05-09-2019 surgery   Fibroids    History of blood transfusion    2 units given 04-26-2019, has had total of 8 units since jan 2021   History of blood transfusion 1 unit given 05-30-19   iv fluids also given   History of cervical cancer 07/16/2022   History of kidney problems    History of radiation therapy 03/23/2019-05/04/2019   Cervical external beam   Dr Antony Blackbird   History of radiation therapy 05/09/2019-06/05/2019   vaginal brachytherapy   Dr Antony Blackbird   History of recent blood transfusion 05/16/2019   2 units given  per dr Lambert Mody at cancer center   Hypertension    Neuropathy    both thumbs   Obesity    Palpitations    PONV (postoperative nausea and vomiting)    Prediabetes    Preeclampsia 2006   Sleep apnea    no cpap used insurance would not cover, osa severe per pt   SOB (shortness of breath)    Squamous cell carcinoma of cervix (HCC) 03/23/2019   Swallowing difficulty     Symptomatic skin lesion 07/14/2022   Vitamin D deficiency 01/10/2020    Objective:  Physical Exam: Vascular: DP/PT pulses 2/4 bilateral. CFT <3 seconds. Normal hair growth on digits. No edema.  Skin. No lacerations or abrasions bilateral feet.  Musculoskeletal: MMT 5/5 bilateral lower extremities in DF, PF, Inversion and Eversion. Deceased ROM in DF of ankle joint. Tender to first MPJ medial and dorsally and pain with ROM of the digit. Mild edema noted and erythema.  Neurological: Sensation intact to light touch.   Assessment:   1. Capsulitis of metatarsophalangeal (MTP) joint of left foot      Plan:  Patient was evaluated and treated and all questions answered. -Xrays reviewed. No acute fractures or dislocations noted.  -Discussed treatement options for  capsulitis of first MPJ gout education provided. -Patient opted for injection. After oral consent, injected left first MPJ with 1cc lidocaine and marcaine plain mixed with 1 cc Dexmethasone phosphate without complication; post injection care explained. -Discussed diet and modifications.  -Advised patient to call if symptoms are not improved within 1 week -Patient to return in 3 weeks for re-check/further discussion for long term management of gout or sooner if  condition worsens.   Louann Sjogren, DPM

## 2023-05-31 NOTE — Telephone Encounter (Signed)
 Melissa called to get ICD codes for previous order ICD R82.90 given lvm

## 2023-06-03 ENCOUNTER — Encounter: Payer: Self-pay | Attending: Radiation Oncology | Admitting: Physical Therapy

## 2023-06-03 ENCOUNTER — Encounter: Payer: Self-pay | Admitting: Physical Therapy

## 2023-06-03 DIAGNOSIS — M6281 Muscle weakness (generalized): Secondary | ICD-10-CM | POA: Diagnosis present

## 2023-06-03 DIAGNOSIS — R102 Pelvic and perineal pain unspecified side: Secondary | ICD-10-CM

## 2023-06-03 NOTE — Therapy (Signed)
 OUTPATIENT PHYSICAL THERAPY FEMALE PELVIC TREATMENT   Patient Name: Tamara Osborne MRN: [id number] DOB:May 21, 1975, 48 y.o., female Today's Date: 06/03/2023  END OF SESSION:  PT End of Session - 06/03/23 1127     Visit Number 10    Date for PT Re-Evaluation 09/13/23    Authorization Type [employer]    Authorization Time Period 03/16/2023 - 06/13/2023    Authorization - Visit Number 9    Authorization - Number of Visits 11    PT Start Time 1120    PT Stop Time 1210    PT Time Calculation (min) 50 min    Activity Tolerance Patient tolerated treatment well    Behavior During Therapy WFL for tasks assessed/performed             Past Medical History:  Diagnosis Date   Anemia    Angio-edema    Anxiety    Bartholin cyst    Cervical cancer (HCC)    Chronic kidney disease    Constipation    Depression    Deviated septum    Diabetes mellitus without complication (HCC)    Difficult intravenous access    used port for 05-09-2019 surgery   Fibroids    History of blood transfusion    2 units given 04-26-2019, has had total of 8 units since jan 2021   History of blood transfusion 1 unit given 05-30-19   iv fluids also given   History of cervical cancer 07/16/2022   History of kidney problems    History of radiation therapy 03/23/2019-05/04/2019   Cervical external beam   Dr Antony Blackbird   History of radiation therapy 05/09/2019-06/05/2019   vaginal brachytherapy   Dr Antony Blackbird   History of recent blood transfusion 05/16/2019   2 units given  per dr Lambert Mody at cancer center   Hypertension    Neuropathy    both thumbs   Obesity    Palpitations    PONV (postoperative nausea and vomiting)    Prediabetes    Preeclampsia 2006   Sleep apnea    no cpap used insurance would not cover, osa severe per pt   SOB (shortness of breath)    Squamous cell carcinoma of cervix (HCC) 03/23/2019   Swallowing difficulty    Symptomatic skin lesion 07/14/2022   Vitamin D deficiency 01/10/2020    Past Surgical History:  Procedure Laterality Date   CESAREAN SECTION WITH BILATERAL TUBAL LIGATION  11/10/2004       DILATION AND CURETTAGE OF UTERUS  1997   IR IMAGING GUIDED PORT INSERTION  04/04/2019   IR REMOVAL TUN ACCESS W/ PORT W/O FL MOD SED  10/27/2021   OPERATIVE ULTRASOUND N/A 05/09/2019   Procedure: OPERATIVE ULTRASOUND;  Surgeon: Antony Blackbird, MD;  Location: [facility];  Service: Urology;  Laterality: N/A;   OPERATIVE ULTRASOUND N/A 05/15/2019   Procedure: OPERATIVE ULTRASOUND;  Surgeon: Antony Blackbird, MD;  Location: [facility];  Service: Urology;  Laterality: N/A;   OPERATIVE ULTRASOUND N/A 05/22/2019   Procedure: OPERATIVE ULTRASOUND;  Surgeon: Antony Blackbird, MD;  Location: [facility];  Service: Urology;  Laterality: N/A;   OPERATIVE ULTRASOUND N/A 05/29/2019   Procedure: OPERATIVE ULTRASOUND;  Surgeon: Antony Blackbird, MD;  Location: [facility];  Service: Urology;  Laterality: N/A;   OPERATIVE ULTRASOUND N/A 06/05/2019   Procedure: OPERATIVE ULTRASOUND;  Surgeon: Antony Blackbird, MD;  Location: [facility];  Service: Urology;  Laterality: N/A;   TANDEM  RING INSERTION N/A 05/09/2019   Procedure: TANDEM RING INSERTION;  Surgeon: Antony Blackbird, MD;  Location: [facility];  Service: Urology;  Laterality: N/A;   TANDEM RING INSERTION N/A 05/15/2019   Procedure: TANDEM RING INSERTION;  Surgeon: Antony Blackbird, MD;  Location: [facility];  Service: Urology;  Laterality: N/A;   TANDEM RING INSERTION N/A 05/22/2019   Procedure: TANDEM RING INSERTION;  Surgeon: Antony Blackbird, MD;  Location: [facility];  Service: Urology;  Laterality: N/A;   TANDEM RING INSERTION N/A 05/29/2019   Procedure: TANDEM RING INSERTION;  Surgeon: Antony Blackbird, MD;  Location: [facility];  Service: Urology;  Laterality: N/A;   TANDEM RING INSERTION N/A 06/05/2019   Procedure: TANDEM RING  INSERTION;  Surgeon: Antony Blackbird, MD;  Location: [facility];  Service: Urology;  Laterality: N/A;   Patient Active Problem List   Diagnosis Date Noted   History of radiation therapy 05/21/2023   Difficult intravenous access 05/21/2023   HSV-2 infection 07/13/2022   Dyspareunia in female 10/23/2021   Hyperlipidemia associated with type 2 diabetes mellitus (HCC) 07/03/2021   OSA (obstructive sleep apnea) 07/31/2020   Other hyperlipidemia 01/10/2020   Insulin resistance 01/10/2020   Class 2 severe obesity with serious comorbidity and body mass index (BMI) of 39.0 to 39.9 in adult [facility]) 01/10/2020   Peripheral 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) 04/25/2019   CKD (chronic kidney disease), symptom management only, stage 3 (moderate) (HCC) 03/30/2019   Generalized anxiety disorder 03/30/2019   Hypertension associated with type 2 diabetes mellitus (HCC)    Squamous cell carcinoma of cervix (HCC) 03/23/2019   Deficiency anemia 03/22/2019    PCP: Alyson Reedy, FNP  REFERRING PROVIDER:  C53.9 (ICD-10-CM) - Squamous cell carcinoma of cervix (HCC)   REFERRING DIAG: Antony Blackbird, MD   THERAPY DIAG:  Muscle weakness (generalized)  Pelvic pain  Rationale for Evaluation and Treatment: Rehabilitation  ONSET DATE: 11/2022   SUBJECTIVE:                                                                                                                                                                                           SUBJECTIVE STATEMENT: I am going to have a CAT scan of pelvis and catheter in the bladder to look at the bladder. We had intercourse and used the Ohnut with lubricant and had some pain.  Fluid intake: Yes: water    PAIN:  Are you having pain? Yes NPRS scale: 4/10 Pain location: Vaginal  Pain type: sharp and soreness that can extend for several days  Pain description: intermittent   Aggravating  factors: vaginal penetration Relieving factors:  time  PRECAUTIONS: Other: cancer with radiation and chemotherapy  RED FLAGS: None   WEIGHT BEARING RESTRICTIONS: No  FALLS:  Has patient fallen in last 6 months? 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    BOWEL MOVEMENT: no issues   URINATION: Pain with urination: No Fully empty bladder: Yes:   Stream: Strong Urgency: Yes:   Frequency: every 3 hours, does not wake up during the night Leakage: Coughing and Sneezing Pads: Yes: when out of home  INTERCOURSE: Pain with intercourse: After Intercourse Ability to have vaginal penetration:  Yes:   Climax: not able to [employer] Scale: 1/3  PREGNANCY: Vaginal deliveries 1 Tearing Yes:    PROLAPSE: None   OBJECTIVE:  Note: Objective measures were completed at Evaluation unless otherwise noted.   PATIENT SURVEYS:  POPIQ-7 38 PFIQ-7 38 06/03/23: POPIQ-7 19 PFIQ-7 19  COGNITION: Overall cognitive status: Within functional limits for tasks assessed     SENSATION: Light touch: Appears intact Proprioception: Appears intact    POSTURE: rounded shoulders, forward head, and decreased lumbar lordosis  PELVIC ALIGNMENT:correct alignment  LUMBARAROM/PROM: lumbar ROM decreased by 50% 05/06/23: lumbar extension decreased by 50%, bilateral sidebending decreased by 25% 06/03/23: lumbar extension decreased by 50%, bilateral sidebending decreased by 25%   LOWER EXTREMITY ZOX:WRUE ROM in bilateral hips.  Tightness in the hip adductors.     LOWER EXTREMITY MMT: bilateral hip strength is 5/5   PALPATION:   General  Difficulty contracting abdominals and performing diaphragmatic breathing.                 External Perineal Exam  tightness in the urogenital diaphragm, tenderness located in the levator ani and perineal body                             Internal Pelvic Floor tightness throughout the pelvic floor muscles, her vaginal canal is shortened  Patient confirms identification and approves PT to assess internal pelvic floor and treatment Yes  PELVIC MMT:   MMT eval 03/30/23 04/06/23 04/14/23 04/20/23 05/13/23  Vaginal 2/5 3/5 3/5 10 sec 3/5 3/5 3/5  (Blank rows = not tested)        TONE: increased    TODAY'S TREATMENT:   06/03/23 Exercises: Stretches/mobility: Sitting piriformis stretch holding 30 sec bil.  Seated hamstring stretch holding 30 sec bil.  Seated hip adductor stretch hold 30 sec bil.  Standing pectoralis stretch holding 30 sec 2 x   Strengthening: Standing pull red band down to engage the abdominals 15 x Bilateral shoulder extension with red band 15 x Pallof with red band 15 x each way    05/27/23 Exercises: Strengthening: Sitting with rolling ball on ground side to side to increase lumbar flexion and engage the obliques and breathing into the back to expand the rib cage Sitting bringing ball overhead to work core 10 x  Hold ball overhead in standing and bring side to side to work on ROM and strength Wall squats hold for 5 sec 20 x  Rolling ball up wall and lean into it for elongation of anterior trunk and strength 20 x  Bilateral shoulder extension with blue tube  band 15 x  Standing pull blue tube down to engage the abdominals 15 x  Standing pulling blue band across body 15 x each way  05/13/23 Manual: Internal pelvic floor techniques: No emotional/communication barriers or cognitive limitation. Patient is motivated to learn. Patient understands and agrees with treatment goals and plan. PT explains patient will be examined in standing, sitting, and lying down to see how their muscles and joints work. When they are ready, they will be asked to remove their underwear so PT can examine  their perineum. The patient is also given the option of providing their own chaperone as one is not provided in our facility. The patient also has the right and is explained the right to defer or refuse any part of the evaluation or treatment including the internal exam. With the patient's consent, PT will use one gloved finger to gently assess the muscles of the pelvic floor, seeing how well it contracts and relaxes and if there is muscle symmetry. After, the patient will get dressed and PT and patient will discuss exam findings and plan of care. PT and patient discuss plan of care, schedule, attendance policy and HEP activities.  Going through the vaginal canal working around the bladder, sides of the urethra, working on elongating the canal, working on the Advice worker Exercises: Stretches/mobility: Sitting with rolling ball on ground side to side to increase lumbar flexion and engage the obliques Sitting lifting physioball overhead then back down to work on ROM and strength Hold ball overhead in sitting and bring side to side to work on ROM and strength Strengthening: Press into the foam roll and engage the abdominals        PATIENT EDUCATION: 06/03/23 Education details: Access Code: [email or web address] Person educated: Patient Education method: Explanation, Demonstration, Tactile cues, Verbal cues, and Handouts Education comprehension: verbalized understanding, returned demonstration, verbal cues required, tactile cues required, and needs further education    HOME EXERCISE PROGRAM: 06/03/23 Access Code: [email or web address] URL: [email or web address] Date: 06/03/2023 Prepared by: Eulis Foster  Exercises - Seated Piriformis Stretch with Trunk Bend  - 1 x daily - 7 x weekly - 1 sets - 1 reps - 30 sec hold - Seated Hamstring Stretch  - 1 x daily - 7 x weekly - 1 sets - 1 reps - 30 sec hold - Seated Hip Adductor Stretch  - 1 x daily - 7 x weekly - 1 sets - 1 reps - 30 sec  hold - Doorway Pec Stretch at 120 Degrees Abduction  - 1 x daily - 7 x weekly - 1 sets - 1 reps - 30 sec hold - Seated Pelvic Floor Contraction  - 3 x daily - 7 x weekly - 1 sets - 5 reps - 5 sec hold - Standing Shoulder Extension with Resistance  - 1 x daily - 3 x weekly - 2 sets - 10 reps - Standing Anti-Rotation Press with Anchored Resistance  - 1 x daily - 3 x weekly - 2 sets - 10 reps   ASSESSMENT:  CLINICAL IMPRESSION: Patient is a 48 y.o. female who was seen today for physical therapy  treatment for pelvic pain.Patient had intercourse with pain level 4/10 compared to 7/10 and she used the Ohnut. Patient reports her urinary leakage has improved. She is working on core strength. She is using the largest dilator vaginally. Patient has increased pelvic floor strength to 3/5. She is using the largest dilator. She understands ways to manage pelvic pain. Urinary leakage decreased  by 50%. She is independent with her HEP.    OBJECTIVE IMPAIRMENTS: decreased coordination, decreased strength, increased fascial restrictions, increased muscle spasms, and pain.   ACTIVITY LIMITATIONS: continence  PARTICIPATION LIMITATIONS: interpersonal relationship and community activity  PERSONAL FACTORS: 3+ comorbidities: hx of hypertension, preeclampsia, hyperlipidemia, OSA, diabetes, squamous cell carcinoma of cervix s/p radiation and chemotherapy, morbid obesity  are also affecting patient's functional outcome.   REHAB POTENTIAL: Excellent  CLINICAL DECISION MAKING: Evolving/moderate complexity  EVALUATION COMPLEXITY: Moderate   GOALS: Goals reviewed with patient? Yes  SHORT TERM GOALS: Target date: 04/13/23  Patient independent with initial flexibility HEP.  Baseline: not educated yet Goal status: Met 03/30/23   LONG TERM GOALS: Target date: 09/13/23  Patient independent with advanced HEP for core and pelvic floor strength.  Baseline: not educated yet Goal status: 06/03/23  2.  Patient  understands ways to manage pelvic pain with penetration with the Ohnut, breathing techniques, warming techniques, and ways to relax the tissue.  Baseline: not educated yet.  Goal status: Met 04/20/23  3.  Pelvic floor strength is >/= 3/5 to reduce urinary leakage >/= 50% with coughing and sneezing.  Baseline: Pelvic strength 3/5 Goal status: Met 05/13/23  4.  Patient is able to have vaginal penetration with pain afterwards reduced >/= 4/10 due to lengthening of the tissue.  Baseline: pain level 4/10 Goal status: Met 05/27/23  Eulis Foster, PT 06/03/23 12:12 PM    PLAN: PHYSICAL THERAPY DISCHARGE SUMMARY  Visits from Start of Care: 10  Current functional level related to goals / functional outcomes: See above.    Remaining deficits: See above.    Education / Equipment: HEP   Patient agrees to discharge. Patient goals were met. Patient is being discharged due to meeting the stated rehab goals. Thank you for the referral.    Eulis Foster, PT 06/03/23 12:12 PM

## 2023-06-07 ENCOUNTER — Encounter: Payer: Self-pay | Admitting: Gynecologic Oncology

## 2023-06-09 LAB — BASIC METABOLIC PANEL WITH GFR
BUN: 23 — AB (ref 4–21)
Chloride: 104 (ref 99–108)
Creatinine: 1.4 — AB (ref 0.5–1.1)
Glucose: 224
Potassium: 4.8 meq/L (ref 3.5–5.1)
Sodium: 140 (ref 137–147)

## 2023-06-09 LAB — MICROALBUMIN / CREATININE URINE RATIO: Microalb Creat Ratio: 1152

## 2023-06-09 LAB — COMPREHENSIVE METABOLIC PANEL WITH GFR: Calcium: 9.4 (ref 8.7–10.7)

## 2023-06-09 LAB — PROTEIN / CREATININE RATIO, URINE: Creatinine, Urine: 90.4

## 2023-06-10 ENCOUNTER — Inpatient Hospital Stay: Payer: Medicaid Other | Attending: Gynecologic Oncology | Admitting: Gynecologic Oncology

## 2023-06-10 ENCOUNTER — Encounter: Payer: Self-pay | Admitting: Gynecologic Oncology

## 2023-06-10 ENCOUNTER — Other Ambulatory Visit (HOSPITAL_COMMUNITY)
Admission: RE | Admit: 2023-06-10 | Discharge: 2023-06-10 | Disposition: A | Discharge status: Home / Self Care | Source: Ambulatory Visit | Attending: Gynecologic Oncology | Admitting: Gynecologic Oncology

## 2023-06-10 VITALS — BP 130/76 | HR 94 | Temp 98.6°F | Resp 20 | Wt 232.0 lb

## 2023-06-10 DIAGNOSIS — Z923 Personal history of irradiation: Secondary | ICD-10-CM | POA: Diagnosis not present

## 2023-06-10 DIAGNOSIS — Z1151 Encounter for screening for human papillomavirus (HPV): Secondary | ICD-10-CM | POA: Insufficient documentation

## 2023-06-10 DIAGNOSIS — Z9221 Personal history of antineoplastic chemotherapy: Secondary | ICD-10-CM | POA: Insufficient documentation

## 2023-06-10 DIAGNOSIS — Z8541 Personal history of malignant neoplasm of cervix uteri: Secondary | ICD-10-CM | POA: Insufficient documentation

## 2023-06-10 DIAGNOSIS — Z08 Encounter for follow-up examination after completed treatment for malignant neoplasm: Secondary | ICD-10-CM | POA: Insufficient documentation

## 2023-06-10 DIAGNOSIS — R319 Hematuria, unspecified: Secondary | ICD-10-CM | POA: Diagnosis not present

## 2023-06-10 DIAGNOSIS — C539 Malignant neoplasm of cervix uteri, unspecified: Secondary | ICD-10-CM | POA: Diagnosis present

## 2023-06-10 NOTE — Patient Instructions (Signed)
 It was good to see you today.  I do not see or feel any evidence of cancer recurrence on your exam.  I will see you for follow-up in 12 months.  We will contact you with your pap results.  As always, if you develop any new and concerning symptoms before your next visit, please call to see me sooner.

## 2023-06-10 NOTE — Progress Notes (Signed)
 Gynecologic Oncology Return Clinic Visit  06/10/23  Reason for Visit: surveillance  Treatment History: Oncology History  Squamous cell carcinoma of cervix (HCC)  03/21/2019 Pathology Results   The biopsy is superficial and therefore depth of invasion cannot be  determined.  There is at least carcinoma in-situ.    03/23/2019 Initial Diagnosis   Squamous cell carcinoma of cervix (HCC)   03/23/2019 Pathology Results   CERVIX, 11 O CLOCK, BIOPSY:  -  Squamous cell carcinoma, invasive  -  See comment   03/23/2019 - 06/05/2019 Radiation Therapy   External beam therapy was from 03/23/2019 - 05/04/2019. HDR vaginal brachytherapy was from 05/19/2019 - 06/05/2019.   Site Technique Total Dose (Gy) Dose per Fx (Gy) Completed Fx Beam Energies  Cervix: Cervix HDR-brachy 5.5/5.5 5.5 1/1 Ir-192  Cervix: Cervix HDR-brachy 5.5/5.5 5.5 1/1 Ir-192  Cervix: Cervix_Bst HDR-brachy 5.5/5.5 5.5 1/1 Ir-192  Cervix: Cervix HDR-brachy 5.5/5.5 5.5 1/1 Ir-192  Cervix: Cervix 3D 45/45 1.8 25/25 10X, 15X  Cervix: Cervix HDR-brachy 5.5/5.5 5.5 1/1 Ir-192  Cervix: Cervix_Bst 3D 9/9 1.8 5/5 15X      03/28/2019 PET scan   1. Hypermetabolic cervical mass measures roughly 7.6 by 6.9 by 4.9 cm, compatible with malignancy. No adenopathy or distant metastatic spread identified. 2. Extending cephalad and anterior to the uterine fundus, there is a 450 cubic cm simple fluid density structure without hypermetabolic activity. This may represent a right adnexal cyst (favored), peritoneal inclusion cyst, or (if the patient has a remote history of pancreatitis) a remote pseudocyst. 3. Faint ground density nodularity in both lower lobes which could be from atypical infection or extrinsic allergic alveolitis. Given nodular appearance of some of this ground-glass density, I would recommend follow up chest CT in 3 months time in order to ensure clearance. 4. Moderate cardiomegaly, cause uncertain. The patient also has advanced for  age/gender atherosclerosis. Aortic Atherosclerosis (ICD10-I70.0).     04/04/2019 Procedure   Successful placement of a right internal jugular approach power injectable Port-A-Cath. The catheter is ready for immediate use   04/07/2019 - 05/12/2019 Chemotherapy   The patient had weekly cisplatin for chemotherapy treatment.     09/06/2019 PET scan   1. No evidence residual hypermetabolic cervical tissue. 2. No evidence of metastatic cervical carcinoma. 3. Large benign cystic mass unchanged in the upper pelvis.        10/27/2021: Port a cath removal   06/11/2022: Pap with HPV testing negative  Interval History: Overall doing well.  Was seen in the emergency department and diagnosed with a urinary tract infection.  Saw blood in her urine at that time.  Was still having some right flank pain and was seen again.  Given continued blood in her urine, she was referred to urology.  She has an upcoming cystoscopy with them.  She saw her nephrologist yesterday.  She has been doing pelvic floor physical therapy which has helped her urinary incontinence.  Also notes that the PT had helped with her dyspareunia although when she is not in the midst of having PT, she notices less of a difference.  Patiently stopped her antidepressant as she had gained about 14 pounds in a relatively short period of time.  Sexually active about 1 time a month.  Uses her vaginal dilator regularly.  Sometimes has minimal spotting after dilator use or intercourse.  Otherwise denies vaginal bleeding.  Past Medical/Surgical History: Past Medical History:  Diagnosis Date   Anemia    Angio-edema    Anxiety  Bartholin cyst    Cervical cancer (HCC)    Chronic kidney disease    Constipation    Depression    Deviated septum    Diabetes mellitus without complication (HCC)    Difficult intravenous access    used port for 05-09-2019 surgery   Fibroids    History of blood transfusion    2 units given 04-26-2019, has had total  of 8 units since jan 2021   History of blood transfusion 1 unit given 05-30-19   iv fluids also given   History of cervical cancer 07/16/2022   History of kidney problems    History of radiation therapy 03/23/2019-05/04/2019   Cervical external beam   Dr Antony Blackbird   History of radiation therapy 05/09/2019-06/05/2019   vaginal brachytherapy   Dr Antony Blackbird   History of recent blood transfusion 05/16/2019   2 units given  per dr Lambert Mody at cancer center   Hypertension    Neuropathy    both thumbs   Obesity    Palpitations    PONV (postoperative nausea and vomiting)    Prediabetes    Preeclampsia 2006   Sleep apnea    no cpap used insurance would not cover, osa severe per pt   SOB (shortness of breath)    Squamous cell carcinoma of cervix (HCC) 03/23/2019   Swallowing difficulty    Symptomatic skin lesion 07/14/2022   Vitamin D deficiency 01/10/2020    Past Surgical History:  Procedure Laterality Date   CESAREAN SECTION WITH BILATERAL TUBAL LIGATION  11/10/2004       DILATION AND CURETTAGE OF UTERUS  1997   IR IMAGING GUIDED PORT INSERTION  04/04/2019   IR REMOVAL TUN ACCESS W/ PORT W/O FL MOD SED  10/27/2021   OPERATIVE ULTRASOUND N/A 05/09/2019   Procedure: OPERATIVE ULTRASOUND;  Surgeon: Antony Blackbird, MD;  Location: [facility];  Service: Urology;  Laterality: N/A;   OPERATIVE ULTRASOUND N/A 05/15/2019   Procedure: OPERATIVE ULTRASOUND;  Surgeon: Antony Blackbird, MD;  Location: [facility];  Service: Urology;  Laterality: N/A;   OPERATIVE ULTRASOUND N/A 05/22/2019   Procedure: OPERATIVE ULTRASOUND;  Surgeon: Antony Blackbird, MD;  Location: [facility];  Service: Urology;  Laterality: N/A;   OPERATIVE ULTRASOUND N/A 05/29/2019   Procedure: OPERATIVE ULTRASOUND;  Surgeon: Antony Blackbird, MD;  Location: [facility];  Service: Urology;  Laterality: N/A;   OPERATIVE ULTRASOUND N/A 06/05/2019   Procedure: OPERATIVE ULTRASOUND;  Surgeon:  Antony Blackbird, MD;  Location: [facility];  Service: Urology;  Laterality: N/A;   TANDEM RING INSERTION N/A 05/09/2019   Procedure: TANDEM RING INSERTION;  Surgeon: Antony Blackbird, MD;  Location: [facility];  Service: Urology;  Laterality: N/A;   TANDEM RING INSERTION N/A 05/15/2019   Procedure: TANDEM RING INSERTION;  Surgeon: Antony Blackbird, MD;  Location: [facility];  Service: Urology;  Laterality: N/A;   TANDEM RING INSERTION N/A 05/22/2019   Procedure: TANDEM RING INSERTION;  Surgeon: Antony Blackbird, MD;  Location: [facility];  Service: Urology;  Laterality: N/A;   TANDEM RING INSERTION N/A 05/29/2019   Procedure: TANDEM RING INSERTION;  Surgeon: Antony Blackbird, MD;  Location: [facility];  Service: Urology;  Laterality: N/A;   TANDEM RING INSERTION N/A 06/05/2019   Procedure: TANDEM RING INSERTION;  Surgeon: Antony Blackbird, MD;  Location: [facility];  Service: Urology;  Laterality: N/A;    Family History  Problem  Relation Age of Onset   Brain cancer Mother        lung cancer to brain   Hypertension Mother    Cancer Mother    Depression Mother    Anxiety disorder Mother    Diabetes Father    Kidney disease Father    Cancer Father        kidney cancer   Heart failure Sister    Heart attack Sister    Heart failure Paternal Aunt    Cancer Maternal Grandmother    Kidney failure Maternal Grandfather     Social History   Socioeconomic History   Marital status: Single    Spouse name: Not on file   Number of children: 4   Years of education: Not on file   Highest education level: Not on file  Occupational History   Not on file  Tobacco Use   Smoking status: Never   Smokeless tobacco: Never  Vaping Use   Vaping status: Never Used  Substance and Sexual Activity   Alcohol use: No   Drug use: No   Sexual activity: Not Currently    Birth control/protection: Surgical  Other Topics Concern    Not on file  Social History Narrative   Lives with her boyfriend   Social Drivers of Health   Financial Resource Strain: High Risk (04/23/2023)   Overall Financial Resource Strain (CARDIA)    Difficulty of Paying Living Expenses: Very hard  Food Insecurity: No Food Insecurity (10/08/2022)   Hunger Vital Sign    Worried About Running Out of Food in the Last Year: Never true    Ran Out of Food in the Last Year: Never true  Transportation Needs: No Transportation Needs (10/08/2022)   PRAPARE - Administrator, Civil Service (Medical): No    Lack of Transportation (Non-Medical): No  Physical Activity: Insufficiently Active (10/08/2022)   Exercise Vital Sign    Days of Exercise per Week: 2 days    Minutes of Exercise per Session: 40 min  Stress: No Stress Concern Present (04/23/2023)   [employer] of Occupational Health - Occupational Stress Questionnaire    Feeling of Stress : Not at all  Social Connections: Socially Isolated (04/23/2023)   Social Connection and Isolation Panel [NHANES]    Frequency of Communication with Friends and Family: More than three times a week    Frequency of Social Gatherings with Friends and Family: Once a week    Attends Religious Services: Never    Database administrator or Organizations: No    Attends Engineer, structural: Never    Marital Status: Never married    Current Medications:  Current Outpatient Medications:    Accu-Chek Softclix Lancets lancets, Use to check blood sugar TID., Disp: 100 each, Rfl: 2   amLODipine-valsartan (EXFORGE) 10-320 MG tablet, Take 1 tablet by mouth daily., Disp: 90 tablet, Rfl: 3   azelastine (ASTELIN) 0.1 % nasal spray, Place 1 spray into both nostrils 2 (two) times daily as needed. Use in each nostril as directed, Disp: 30 mL, Rfl: 5   Blood Glucose Monitoring Suppl (ACCU-CHEK GUIDE) w/Device KIT, Use to check blood sugar TID., Disp: 1 kit, Rfl: 0   Cholecalciferol 50 MCG (2000 UT) TABS, Take by  mouth., Disp: , Rfl:    EPINEPHrine (EPIPEN 2-PAK) 0.3 mg/0.3 mL IJ SOAJ injection, Inject 0.3 mg into the muscle as needed for anaphylaxis., Disp: 2 each, Rfl: 1   fluticasone (FLONASE) 50 MCG/ACT nasal spray,  Place 2 sprays into both nostrils daily., Disp: 16 g, Rfl: 5   glucose blood (ACCU-CHEK GUIDE) test strip, Use to check blood sugar TID., Disp: 100 each, Rfl: 2   ipratropium (ATROVENT) 0.06 % nasal spray, Place 2 sprays into both nostrils 3 (three) times daily as needed for rhinitis (drainage)., Disp: 15 mL, Rfl: 5   levocetirizine (XYZAL) 5 MG tablet, Take 1 tablet (5 mg total) by mouth every evening., Disp: 90 tablet, Rfl: 1   RESTASIS 0.05 % ophthalmic emulsion, 1 drop 2 (two) times daily., Disp: , Rfl:    Semaglutide, 1 MG/DOSE, 4 MG/3ML SOPN, Inject 1 mg as directed once a week., Disp: 3 mL, Rfl: 0  Review of Systems: + dyspareunia, hematuria, numbness, problem with walking, bruising/bleeding easy Denies appetite changes, fevers, chills, fatigue, unexplained weight changes. Denies hearing loss, neck lumps or masses, mouth sores, ringing in ears or voice changes. Denies cough or wheezing.  Denies shortness of breath. Denies chest pain or palpitations. Denies leg swelling. Denies abdominal distention, pain, blood in stools, constipation, diarrhea, nausea, vomiting, or early satiety. Denies dysuria, frequency or incontinence. Denies hot flashes, pelvic pain, vaginal bleeding or vaginal discharge.   Denies joint pain, back pain or muscle pain/cramps. Denies itching, rash, or wounds. Denies dizziness, headaches or seizures. Denies swollen lymph nodes or glands. Denies anxiety, depression, confusion, or decreased concentration.  Physical Exam: BP 130/76 Comment: manual recheck  Pulse 94   Temp 98.6 F (37 C) (Oral)   Resp 20   Wt 232 lb (105.2 kg)   LMP  (LMP Unknown)   SpO2 100%   BMI 43.84 kg/m  General: Alert, oriented, no acute distress. HEENT: Normocephalic,  atraumatic, sclera anicteric. Chest: Clear to auscultation bilaterally.  No wheezes or rhonchi.  Keloid scar over prior port incision. Cardiovascular: Regular rate and rhythm, no murmurs. Abdomen: Obese, soft, nontender.  Normoactive bowel sounds.  No masses or hepatosplenomegaly appreciated.  Extremities: Grossly normal range of motion.  Warm, well perfused.  No edema bilaterally. Lymphatics: No cervical, supraclavicular, or inguinal adenopathy. GU: Normal appearing external genitalia without erythema, excoriation, or lesions.  Speculum exam reveals moderately atrophic vaginal mucosa, no lesions or masses, no bleeding or discharge.  The cervix itself is flush with the vaginal apex, somewhat posteriorly deviated.  No atypical vascularity.  Pap and HPV collected. Bimanual exam reveals no nodularity or masses. Rectovaginal exam confirms these findings.  Laboratory & Radiologic Studies: None new  Assessment & Plan: Tamara Osborne is a 48 y.o. woman with Stage IIB SCC who presents for surveillance.  Completed adjuvant chemoradiation in April 2021.   Patient remains NED.  Pap test and HPV testing was performed today.  Encouraged continued dilator use.  Encouragement given for weight loss. She voices motivation to lose some of the weight she has gained recently.   She is currently being worked up for hematuria.  Cytology was negative for high-grade urothelial carcinoma.  Benign urothelial cells seen.   Per NCCN surveillance recommendations, we will continue with visits alternating between our office and radiation oncology every 6 months.  We reviewed signs and symptoms that should prompt a phone call before her next scheduled visit. We will see her for follow-up in 1 year.   22 minutes of total time was spent for this patient encounter, including preparation, face-to-face counseling with the patient and coordination of care, and documentation of the encounter.  Eugene Garnet, MD  Division  of Gynecologic Oncology  Department of Obstetrics and Gynecology  [employer]

## 2023-06-16 ENCOUNTER — Encounter: Payer: Self-pay | Admitting: Gynecologic Oncology

## 2023-06-16 LAB — CYTOLOGY - PAP
Comment: NEGATIVE
Diagnosis: NEGATIVE
Diagnosis: REACTIVE
High risk HPV: NEGATIVE

## 2023-06-18 LAB — LAB REPORT - SCANNED
Creatinine, POC: 90.4 mg/dL
EGFR: 48

## 2023-06-21 ENCOUNTER — Encounter: Payer: Self-pay | Admitting: Podiatry

## 2023-06-21 ENCOUNTER — Ambulatory Visit: Admitting: Podiatry

## 2023-06-21 DIAGNOSIS — E1140 Type 2 diabetes mellitus with diabetic neuropathy, unspecified: Secondary | ICD-10-CM

## 2023-06-21 DIAGNOSIS — M7752 Other enthesopathy of left foot: Secondary | ICD-10-CM | POA: Diagnosis not present

## 2023-06-21 NOTE — Progress Notes (Signed)
  Subjective:  Patient ID: Tamara Osborne, female    DOB: Sep 29, 1975,   MRN: [id number]  Chief Complaint  Patient presents with   Foot Pain    Pt stated that her foot is doing much better after the injection she stated that because of her neuropathy she has pain all the time     48 y.o. female presents for follow-up of left foot pain. Relates doing better and pain has resolved. She is diabetic and has a history of chemo that caused neruopathy. Patient is diabetic and last A1c was  Lab Results  Component Value Date   HGBA1C 5.9 (H) 08/06/2022   .   PCP:  Nonda Bays, FNP    . Denies any other pedal complaints. Denies n/v/f/c.   Past Medical History:  Diagnosis Date   Anemia    Angio-edema    Anxiety    Bartholin cyst    Cervical cancer (HCC)    Chronic kidney disease    Constipation    Depression    Deviated septum    Diabetes mellitus without complication (HCC)    Difficult intravenous access    used port for 05-09-2019 surgery   Fibroids    History of blood transfusion    2 units given 04-26-2019, has had total of 8 units since jan 2021   History of blood transfusion 1 unit given 05-30-19   iv fluids also given   History of cervical cancer 07/16/2022   History of kidney problems    History of radiation therapy 03/23/2019-05/04/2019   Cervical external beam   Dr Retta Caster   History of radiation therapy 05/09/2019-06/05/2019   vaginal brachytherapy   Dr Retta Caster   History of recent blood transfusion 05/16/2019   2 units given  per dr Bonita Bussing at cancer center   Hypertension    Neuropathy    both thumbs   Obesity    Palpitations    PONV (postoperative nausea and vomiting)    Prediabetes    Preeclampsia 2006   Sleep apnea    no cpap used insurance would not cover, osa severe per pt   SOB (shortness of breath)    Squamous cell carcinoma of cervix (HCC) 03/23/2019   Swallowing difficulty    Symptomatic skin lesion 07/14/2022   Vitamin D  deficiency  01/10/2020    Objective:  Physical Exam: Vascular: DP/PT pulses 2/4 bilateral. CFT <3 seconds. Normal hair growth on digits. No edema.  Skin. No lacerations or abrasions bilateral feet.  Musculoskeletal: MMT 5/5 bilateral lower extremities in DF, PF, Inversion and Eversion. Deceased ROM in DF of ankle joint. Non tender to first MPJ medial and dorsally and pain with ROM of the digit. Mild edema noted and erythema.  Neurological: Sensation intact to light touch.   Assessment:   1. Capsulitis of metatarsophalangeal (MTP) joint of left foot   2. Diabetic neuropathy with neurologic complication [facility])       Plan:  Patient was evaluated and treated and all questions answered. -Xrays reviewed. No acute fractures or dislocations noted.  -Discussed treatement options for  capsulitis of first MPJ gout education provided. -Pain improved. Advised on what to look out for and other concerned today.  -Discussed diet and modifications.  -Patient to return in  1 year for DM foot check.    Jennefer Moats, DPM

## 2023-06-22 ENCOUNTER — Ambulatory Visit (HOSPITAL_BASED_OUTPATIENT_CLINIC_OR_DEPARTMENT_OTHER): Payer: Medicaid Other | Admitting: Family Medicine

## 2023-06-29 ENCOUNTER — Encounter (INDEPENDENT_AMBULATORY_CARE_PROVIDER_SITE_OTHER): Payer: Self-pay | Admitting: Family Medicine

## 2023-06-29 ENCOUNTER — Ambulatory Visit (INDEPENDENT_AMBULATORY_CARE_PROVIDER_SITE_OTHER): Admitting: Family Medicine

## 2023-06-29 VITALS — BP 145/81 | HR 78 | Temp 98.3°F | Ht 61.0 in | Wt 230.0 lb

## 2023-06-29 DIAGNOSIS — E1122 Type 2 diabetes mellitus with diabetic chronic kidney disease: Secondary | ICD-10-CM

## 2023-06-29 DIAGNOSIS — Z7985 Long-term (current) use of injectable non-insulin antidiabetic drugs: Secondary | ICD-10-CM

## 2023-06-29 DIAGNOSIS — Z6841 Body Mass Index (BMI) 40.0 and over, adult: Secondary | ICD-10-CM | POA: Diagnosis not present

## 2023-06-29 DIAGNOSIS — N183 Chronic kidney disease, stage 3 unspecified: Secondary | ICD-10-CM

## 2023-06-29 DIAGNOSIS — N1832 Chronic kidney disease, stage 3b: Secondary | ICD-10-CM | POA: Diagnosis not present

## 2023-06-29 MED ORDER — SEMAGLUTIDE (1 MG/DOSE) 4 MG/3ML ~~LOC~~ SOPN: 1.0000 mg | PEN_INJECTOR | SUBCUTANEOUS | 0 refills | Status: DC

## 2023-06-29 NOTE — Progress Notes (Signed)
 SUBJECTIVE:  Chief Complaint: Obesity  Interim History: Patient here for follow up since last in office appointment in November of last year.  She did have a video visit with Acie Holiday in December.  She is experiencing significant pain in her foot and is awaiting evaluation with rheumatology and urology. Everything came back good at oncology appointment she had.  She wants to really commit back to the meal plan she was on previously.  She hasn't been as able to do the physical activity she did previously due to pain.  Biggest obstacle to Category 3 is eating total amount of protein.    Tamara Osborne is here to discuss her progress with her obesity treatment plan. She is on the Category 3 Plan and states she is following her eating plan approximately 90 % of the time. She states she is not exercising.   OBJECTIVE: Visit Diagnoses: Problem List Items Addressed This Visit       Endocrine   Type 2 diabetes mellitus with stage 3b chronic kidney disease, without long-term current use of insulin  (HCC) - Primary   Patient disclosed that recently her creatinine has increased.  She is on ozempic  which should help with her kidneys in addition to blood sugar control and weight.  Needs a refill of her medication today.  Discussed importance of ensuring adequate nutrition.  Refill sent in      Relevant Medications   Semaglutide , 1 MG/DOSE, 4 MG/3ML SOPN     Genitourinary   CKD (chronic kidney disease), symptom management only, stage 3 (moderate) (HCC)   Reviewed most recent labs done just a few days ago- creatinine at similar level as before.  Patient did not disclose any additional treatment plan other than monitoring at this time.        Other   Morbid obesity (HCC)   Anthropometric Measurements Height: 5\' 1"  (1.549 m) Weight: 230 lb (104.3 kg) BMI (Calculated): 43.48 Weight at Last Visit: 228 lb Weight Lost Since Last Visit: 0 Weight Gained Since Last Visit: 2 Starting Weight: 208 lb Total  Weight Loss (lbs): 0 lb (0 kg) Body Composition  Body Fat %: 48.5 % Fat Mass (lbs): 111.6 lbs Muscle Mass (lbs): 112.4 lbs Total Body Water (lbs): 84 lbs Visceral Fat Rating : 15 Other Clinical Data Today's Visit #: 85 Starting Date: 09/20/19 Comments: Cat 3       Relevant Medications   Semaglutide , 1 MG/DOSE, 4 MG/3ML SOPN   Other Visit Diagnoses       BMI 40.0-44.9, adult (HCC), Current BMI 43.46       Relevant Medications   Semaglutide , 1 MG/DOSE, 4 MG/3ML SOPN       No data recorded       06/29/2023   11:35 AM 06/29/2023   11:00 AM 06/10/2023    2:45 PM  Vitals with BMI  Height  5\' 1"    Weight  230 lbs   BMI  43.48   Systolic 145 144 829  Diastolic 81 84 76  Pulse  78       ASSESSMENT AND PLAN:  Diet: Tamara Osborne is currently in the action stage of change. As such, her goal is to continue with weight loss efforts and has agreed to keeping a food journal and adhering to recommended goals of 1400-1500 calories and 95 or more grams of protein daily. Patient to start food log or journaling meal plan.  The initial goal will be to habitually log or journal for at least 4 days a  week.  The expectation it that patient may not initially meet calorie or protein goals as the nturitional understanding of food intake is begun.  We discussed the 10:1 ratio when reading a food label.  Patient agrees to keep a food log either electronically or on paper and bring to the next appointment to be able to dissect and discuss it with provider.    Exercise:  All adults should avoid inactivity. Some activity is better than none, and adults who participate in any amount of physical activity, gain some health benefits.  Behavior Modification:  We discussed the following Behavioral Modification Strategies today: increasing lean protein intake, decreasing simple carbohydrates, meal planning and cooking strategies, avoiding temptations, and planning for success.   Return in about 3 weeks  (around 07/20/2023).[name] She was informed of the importance of frequent follow up visits to maximize her success with intensive lifestyle modifications for her multiple health conditions.  Attestation Statements:   Reviewed by clinician on day of visit: allergies, medications, problem list, medical history, surgical history, family history, social history, and previous encounter notes.   Donaciano Frizzle, MD

## 2023-07-07 ENCOUNTER — Ambulatory Visit (HOSPITAL_COMMUNITY)

## 2023-07-10 NOTE — Assessment & Plan Note (Signed)
 Reviewed most recent labs done just a few days ago- creatinine at similar level as before.  Patient did not disclose any additional treatment plan other than monitoring at this time.

## 2023-07-10 NOTE — Assessment & Plan Note (Signed)
 Anthropometric Measurements Height: 5\' 1"  (1.549 m) Weight: 230 lb (104.3 kg) BMI (Calculated): 43.48 Weight at Last Visit: 228 lb Weight Lost Since Last Visit: 0 Weight Gained Since Last Visit: 2 Starting Weight: 208 lb Total Weight Loss (lbs): 0 lb (0 kg) Body Composition  Body Fat %: 48.5 % Fat Mass (lbs): 111.6 lbs Muscle Mass (lbs): 112.4 lbs Total Body Water (lbs): 84 lbs Visceral Fat Rating : 15 Other Clinical Data Today's Visit #: 51 Starting Date: 09/20/19 Comments: Cat 3

## 2023-07-10 NOTE — Assessment & Plan Note (Signed)
 Patient disclosed that recently her creatinine has increased.  She is on ozempic  which should help with her kidneys in addition to blood sugar control and weight.  Needs a refill of her medication today.  Discussed importance of ensuring adequate nutrition.  Refill sent in

## 2023-07-14 ENCOUNTER — Ambulatory Visit: Admitting: Urology

## 2023-07-14 ENCOUNTER — Encounter: Payer: Self-pay | Admitting: Urology

## 2023-07-14 VITALS — BP 148/82 | HR 105

## 2023-07-14 DIAGNOSIS — N3001 Acute cystitis with hematuria: Secondary | ICD-10-CM

## 2023-07-14 DIAGNOSIS — R310 Gross hematuria: Secondary | ICD-10-CM

## 2023-07-14 LAB — URINALYSIS, ROUTINE W REFLEX MICROSCOPIC
Bilirubin, UA: NEGATIVE
Glucose, UA: NEGATIVE
Ketones, UA: NEGATIVE
Nitrite, UA: NEGATIVE
Specific Gravity, UA: 1.025 (ref 1.005–1.030)
Urobilinogen, Ur: 0.2 mg/dL (ref 0.2–1.0)
pH, UA: 6 (ref 5.0–7.5)

## 2023-07-14 LAB — MICROSCOPIC EXAMINATION

## 2023-07-14 MED ORDER — CIPROFLOXACIN HCL 500 MG PO TABS
500.0000 mg | ORAL_TABLET | Freq: Once | ORAL | Status: DC
Start: 2023-07-14 — End: 2023-07-14

## 2023-07-14 MED ORDER — NITROFURANTOIN MONOHYD MACRO 100 MG PO CAPS: 100.0000 mg | ORAL_CAPSULE | Freq: Two times a day (BID) | ORAL | 0 refills | Status: DC

## 2023-07-14 NOTE — Progress Notes (Signed)
 07/14/2023 11:16 AM   Tamara Osborne 14-Mar-1975 [id number]  Referring provider: Nonda Bays, FNP [address] [place],  [place] [address]  Frequent UTI   HPI: Tamara Osborne is a 48yo here for followup for frequent UTI. She has had 3 UTIs since December 2024. She has a hx of cervical cancer and underwent pelvic radiation. She has a hx of CKD and last creatinine was 1.4. She has persistent microhematuria. She has increased urinary frequency and urgency for the past 10 days. She has intermittent dysuria. No recent gross hematuria. She has suprapubic pain and pressure. She is scheduled for Ct hematuria protocol 5/16.    PMH: Past Medical History:  Diagnosis Date   Anemia    Angio-edema    Anxiety    Bartholin cyst    Cervical cancer (HCC)    Chronic kidney disease    Constipation    Depression    Deviated septum    Diabetes mellitus without complication (HCC)    Difficult intravenous access    used port for 05-09-2019 surgery   Fibroids    History of blood transfusion    2 units given 04-26-2019, has had total of 8 units since jan 2021   History of blood transfusion 1 unit given 05-30-19   iv fluids also given   History of cervical cancer 07/16/2022   History of kidney problems    History of radiation therapy 03/23/2019-05/04/2019   Cervical external beam   Dr Retta Caster   History of radiation therapy 05/09/2019-06/05/2019   vaginal brachytherapy   Dr Retta Caster   History of recent blood transfusion 05/16/2019   2 units given  per dr Bonita Bussing at cancer center   Hypertension    Neuropathy    both thumbs   Obesity    Palpitations    PONV (postoperative nausea and vomiting)    Prediabetes    Preeclampsia 2006   Sleep apnea    no cpap used insurance would not cover, osa severe per pt   SOB (shortness of breath)    Squamous cell carcinoma of cervix (HCC) 03/23/2019   Swallowing difficulty    Symptomatic skin lesion 07/14/2022   Vitamin D   deficiency 01/10/2020    Surgical History: Past Surgical History:  Procedure Laterality Date   CESAREAN SECTION WITH BILATERAL TUBAL LIGATION  11/10/2004       DILATION AND CURETTAGE OF UTERUS  1997   IR IMAGING GUIDED PORT INSERTION  04/04/2019   IR REMOVAL TUN ACCESS W/ PORT W/O FL MOD SED  10/27/2021   OPERATIVE ULTRASOUND N/A 05/09/2019   Procedure: OPERATIVE ULTRASOUND;  Surgeon: Retta Caster, MD;  Location: [facility] ;  Service: Urology;  Laterality: N/A;   OPERATIVE ULTRASOUND N/A 05/15/2019   Procedure: OPERATIVE ULTRASOUND;  Surgeon: Retta Caster, MD;  Location: [facility];  Service: Urology;  Laterality: N/A;   OPERATIVE ULTRASOUND N/A 05/22/2019   Procedure: OPERATIVE ULTRASOUND;  Surgeon: Retta Caster, MD;  Location: [facility];  Service: Urology;  Laterality: N/A;   OPERATIVE ULTRASOUND N/A 05/29/2019   Procedure: OPERATIVE ULTRASOUND;  Surgeon: Retta Caster, MD;  Location: [facility];  Service: Urology;  Laterality: N/A;   OPERATIVE ULTRASOUND N/A 06/05/2019   Procedure: OPERATIVE ULTRASOUND;  Surgeon: Retta Caster, MD;  Location: [facility];  Service: Urology;  Laterality: N/A;   TANDEM RING INSERTION N/A 05/09/2019   Procedure: TANDEM RING INSERTION;  Surgeon: Retta Caster, MD;  Location:  [facility];  Service: Urology;  Laterality: N/A;   TANDEM RING INSERTION N/A 05/15/2019   Procedure: TANDEM RING INSERTION;  Surgeon: Retta Caster, MD;  Location: [facility];  Service: Urology;  Laterality: N/A;   TANDEM RING INSERTION N/A 05/22/2019   Procedure: TANDEM RING INSERTION;  Surgeon: Retta Caster, MD;  Location: [facility];  Service: Urology;  Laterality: N/A;   TANDEM RING INSERTION N/A 05/29/2019   Procedure: TANDEM RING INSERTION;  Surgeon: Retta Caster, MD;  Location: [facility];  Service: Urology;  Laterality: N/A;   TANDEM RING  INSERTION N/A 06/05/2019   Procedure: TANDEM RING INSERTION;  Surgeon: Retta Caster, MD;  Location: [facility];  Service: Urology;  Laterality: N/A;    Home Medications:  Allergies as of 07/14/2023       Reactions   Penicillins    Not sure a child allergy    Shellfish Allergy  Swelling   Seafood also any kind   Sulfa Antibiotics    Not sure a child allergy         Medication List        Accurate as of Jul 14, 2023 11:16 AM. If you have any questions, ask your nurse or doctor.          Accu-Chek Guide test strip Generic drug: glucose blood Use to check blood sugar TID.   Accu-Chek Guide w/Device Kit Use to check blood sugar TID.   Accu-Chek Softclix Lancets lancets Use to check blood sugar TID.   amLODipine -valsartan  10-320 MG tablet Commonly known as: EXFORGE  Take 1 tablet by mouth daily.   azelastine  0.1 % nasal spray Commonly known as: ASTELIN  Place 1 spray into both nostrils 2 (two) times daily as needed. Use in each nostril as directed   Cholecalciferol 50 MCG (2000 UT) Tabs Take by mouth.   EPINEPHrine  0.3 mg/0.3 mL Soaj injection Commonly known as: EpiPen  2-Pak Inject 0.3 mg into the muscle as needed for anaphylaxis.   fluticasone  50 MCG/ACT nasal spray Commonly known as: FLONASE  Place 2 sprays into both nostrils daily.   ipratropium 0.06 % nasal spray Commonly known as: ATROVENT  Place 2 sprays into both nostrils 3 (three) times daily as needed for rhinitis (drainage).   levocetirizine 5 MG tablet Commonly known as: XYZAL  Take 1 tablet (5 mg total) by mouth every evening.   Restasis 0.05 % ophthalmic emulsion Generic drug: cycloSPORINE 1 drop 2 (two) times daily.   Semaglutide  (1 MG/DOSE) 4 MG/3ML Sopn Inject 1 mg as directed once a week.        Allergies:  Allergies  Allergen Reactions   Penicillins     Not sure a child allergy    Shellfish Allergy  Swelling    Seafood also any kind   Sulfa Antibiotics     Not sure a  child allergy     Family History: Family History  Problem Relation Age of Onset   Brain cancer Mother        lung cancer to brain   Hypertension Mother    Cancer Mother    Depression Mother    Anxiety disorder Mother    Diabetes Father    Kidney disease Father    Cancer Father        kidney cancer   Heart failure Sister    Heart attack Sister    Heart failure Paternal Aunt    Cancer Maternal Grandmother    Kidney failure Maternal Grandfather     Social History:  reports that she has never smoked. She has never used smokeless tobacco. She reports that she does not drink alcohol and does not use drugs.  ROS: All other review of systems were reviewed and are negative except what is noted above in HPI  Physical Exam: BP (!) 148/82   Pulse (!) 105   LMP  (LMP Unknown)   Constitutional:  Alert and oriented, No acute distress. HEENT: [place] AT, moist mucus membranes.  Trachea midline, no masses. Cardiovascular: No clubbing, cyanosis, or edema. Respiratory: Normal respiratory effort, no increased work of breathing. GI: Abdomen is soft, nontender, nondistended, no abdominal masses GU: No CVA tenderness.  Lymph: No cervical or inguinal lymphadenopathy. Skin: No rashes, bruises or suspicious lesions. Neurologic: Grossly intact, no focal deficits, moving all 4 extremities. Psychiatric: Normal mood and affect.  Laboratory Data: Lab Results  Component Value Date   WBC 9.3 08/06/2022   HGB 15.3 08/06/2022   HCT 45.3 08/06/2022   MCV 83 08/06/2022   PLT 326 08/06/2022    Lab Results  Component Value Date   CREATININE 1.4 (A) 06/09/2023    No results found for: "PSA"  No results found for: "TESTOSTERONE"  Lab Results  Component Value Date   HGBA1C 5.9 (H) 08/06/2022    Urinalysis    Component Value Date/Time   COLORURINE STRAW (A) 02/26/2023 1942   APPEARANCEUR Clear 05/21/2023 1116   LABSPEC 1.008 02/26/2023 1942   PHURINE 5.0 02/26/2023 1942   GLUCOSEU Negative  05/21/2023 1116   HGBUR MODERATE (A) 02/26/2023 1942   BILIRUBINUR Negative 05/21/2023 1116   KETONESUR negative 04/23/2023 0924   KETONESUR NEGATIVE 02/26/2023 1942   PROTEINUR 2+ (A) 05/21/2023 1116   PROTEINUR 30 (A) 02/26/2023 1942   UROBILINOGEN 0.2 04/23/2023 0924   NITRITE Negative 05/21/2023 1116   NITRITE NEGATIVE 02/26/2023 1942   LEUKOCYTESUR 1+ (A) 05/21/2023 1116   LEUKOCYTESUR LARGE (A) 02/26/2023 1942    Lab Results  Component Value Date   LABMICR See below: 05/21/2023   WBCUA >30 (A) 05/21/2023   LABEPIT 0-10 05/21/2023   BACTERIA Few (A) 05/21/2023    Pertinent Imaging:  No results found for this or any previous visit.  No results found for this or any previous visit.  No results found for this or any previous visit.  No results found for this or any previous visit.  No results found for this or any previous visit.  No results found for this or any previous visit.  No results found for this or any previous visit.  No results found for this or any previous visit.   Assessment & Plan:    1. Gross hematuria (Primary) -CT hematuria protocol - Urinalysis, Routine w reflex microscopic - ciprofloxacin (CIPRO) tablet 500 mg - Cystoscopy (Bedside)  2. Acute cystitis with hematuria Urine for culture -macrobid 100mg  BID    No follow-ups on file.  Johnie Nailer, MD  [facility] [place]

## 2023-07-14 NOTE — Patient Instructions (Signed)

## 2023-07-16 ENCOUNTER — Ambulatory Visit (HOSPITAL_COMMUNITY)
Admission: RE | Admit: 2023-07-16 | Discharge: 2023-07-16 | Disposition: A | Discharge status: Home / Self Care | Source: Ambulatory Visit | Attending: Urology | Admitting: Urology

## 2023-07-16 DIAGNOSIS — Z923 Personal history of irradiation: Secondary | ICD-10-CM | POA: Insufficient documentation

## 2023-07-16 DIAGNOSIS — C539 Malignant neoplasm of cervix uteri, unspecified: Secondary | ICD-10-CM | POA: Insufficient documentation

## 2023-07-16 DIAGNOSIS — R399 Unspecified symptoms and signs involving the genitourinary system: Secondary | ICD-10-CM | POA: Insufficient documentation

## 2023-07-16 DIAGNOSIS — R8290 Unspecified abnormal findings in urine: Secondary | ICD-10-CM | POA: Insufficient documentation

## 2023-07-16 DIAGNOSIS — Z8541 Personal history of malignant neoplasm of cervix uteri: Secondary | ICD-10-CM | POA: Insufficient documentation

## 2023-07-16 DIAGNOSIS — Z789 Other specified health status: Secondary | ICD-10-CM | POA: Insufficient documentation

## 2023-07-16 DIAGNOSIS — Z87440 Personal history of urinary (tract) infections: Secondary | ICD-10-CM | POA: Diagnosis present

## 2023-07-16 DIAGNOSIS — R310 Gross hematuria: Secondary | ICD-10-CM | POA: Insufficient documentation

## 2023-07-16 LAB — URINE CULTURE

## 2023-07-16 MED ORDER — IOHEXOL 300 MG/ML  SOLN
125.0000 mL | Freq: Once | INTRAMUSCULAR | Status: AC | PRN
  Administered 2023-07-16: 125 mL via INTRAVENOUS

## 2023-07-21 ENCOUNTER — Encounter: Payer: Self-pay | Admitting: Urology

## 2023-07-21 ENCOUNTER — Ambulatory Visit: Admitting: Urology

## 2023-07-21 VITALS — BP 147/81 | HR 103

## 2023-07-21 DIAGNOSIS — R310 Gross hematuria: Secondary | ICD-10-CM

## 2023-07-21 LAB — URINALYSIS, ROUTINE W REFLEX MICROSCOPIC
Bilirubin, UA: NEGATIVE
Glucose, UA: NEGATIVE
Ketones, UA: NEGATIVE
Nitrite, UA: NEGATIVE
Specific Gravity, UA: 1.025 (ref 1.005–1.030)
Urobilinogen, Ur: 0.2 mg/dL (ref 0.2–1.0)
pH, UA: 6 (ref 5.0–7.5)

## 2023-07-21 LAB — MICROSCOPIC EXAMINATION: Bacteria, UA: NONE SEEN

## 2023-07-21 MED ORDER — CIPROFLOXACIN HCL 500 MG PO TABS
500.0000 mg | ORAL_TABLET | Freq: Once | ORAL | Status: AC
  Administered 2023-07-21: 500 mg via ORAL

## 2023-07-21 MED ORDER — PREMARIN 0.625 MG/GM VA CREA: 1.0000 | TOPICAL_CREAM | VAGINAL | 12 refills | Status: DC

## 2023-07-21 NOTE — Patient Instructions (Signed)
 Blood in the Pee (Hematuria) in Adults: What to Know  Hematuria is blood in the pee. You may be able to see blood in the pee. In some cases, a health care provider may find blood with a test.  Blood in the pee can be caused by infections of the kidney, bladder, or the urethra. The urethra is the tube that drains pee from the bladder.  Other causes may include: Kidney stones. Infection of the prostate. Cancer. Too much calcium in the pee. Conditions that are passed from parent to child. Too much exercise. Infections can be treated with medicine. A kidney stone will usually leave your body when you pee. If infections or kidney stones didn't cause the blood in the urine, then more tests may be needed. It is very important to tell your provider about any blood in your pee, even if you have no pain or the blood stops with no treatment. Blood in the pee can be a sign of a very serious problem, such as cancer. Follow these instructions at home: Medicines Take your medicines only as told. If you were given antibiotics, take them as told. Do not stop taking them even if you start to feel better. Eating and drinking Drink more fluids as told. Aim to drink 3-4 quarts (2.8-3.8 L) a day. Avoid caffeine, tea, and carbonated drinks. These can bother the bladder. Avoid alcohol if a female because it may irritate the prostate. General instructions If you have been diagnosed with a kidney stone, strain your pee to catch the stone if told by your provider. Empty your bladder often. Avoid holding pee for a long time. If you're female, make sure that: You wipe from front to back after using the bathroom. You use each piece of toilet paper only once. You pee before and after sex. It's up to you to get the results of any tests. Ask when your results will be ready and how to get them. You may need to call or meet with your provider to get your results. Keep all follow-up visits. Your provider will need to know  about any changes or any new symptoms. Contact a health care provider if: Your symptoms don't get better after 3 days. Your symptoms get worse. You have back pain or belly pain. You have a fever or chills. You throw up or feel like you may throw up. You throw up every time you take medicine. Get help right away if: You pass blood clots in your pee. You pass out. These symptoms may be an emergency. Call 911 right away. Do not wait to see if the symptoms will go away. Do not drive yourself to the hospital. This information is not intended to replace advice given to you by your health care provider. Make sure you discuss any questions you have with your health care provider. Document Revised: 12/03/2022 Document Reviewed: 11/12/2022 Elsevier Patient Education  2024 [employer].

## 2023-07-21 NOTE — Progress Notes (Signed)
   07/21/23  CC: hematuria   HPI: [name] is a 48yo here for cystoscoyp for hematuria Blood pressure (!) 147/81, pulse (!) 103. NED. A&Ox3.   No respiratory distress   Abd soft, NT, ND Normal external genitalia with patent urethral meatus  Cystoscopy Procedure Note  Patient identification was confirmed, informed consent was obtained, and patient was prepped using Betadine solution.  Lidocaine  jelly was administered per urethral meatus.    Procedure: - Flexible cystoscope introduced, without any difficulty.   - Thorough search of the bladder revealed:    normal urethral meatus    normal urothelium    no stones    no ulcers     no tumors    no urethral polyps    no trabeculation  - Ureteral orifices were normal in position and appearance.  Post-Procedure: - Patient tolerated the procedure well  Assessment/ Plan: We will trial premarin  cream QOD   No follow-ups on file.  Tamara Nailer, MD

## 2023-07-22 ENCOUNTER — Encounter (HOSPITAL_BASED_OUTPATIENT_CLINIC_OR_DEPARTMENT_OTHER): Payer: Self-pay | Admitting: Family Medicine

## 2023-07-22 ENCOUNTER — Ambulatory Visit (HOSPITAL_BASED_OUTPATIENT_CLINIC_OR_DEPARTMENT_OTHER): Admitting: Family Medicine

## 2023-07-22 ENCOUNTER — Ambulatory Visit: Payer: Self-pay | Admitting: Urology

## 2023-07-22 VITALS — BP 138/76 | HR 73 | Ht 61.0 in | Wt 230.9 lb

## 2023-07-22 DIAGNOSIS — Z1211 Encounter for screening for malignant neoplasm of colon: Secondary | ICD-10-CM

## 2023-07-22 DIAGNOSIS — M2550 Pain in unspecified joint: Secondary | ICD-10-CM

## 2023-07-22 DIAGNOSIS — E785 Hyperlipidemia, unspecified: Secondary | ICD-10-CM

## 2023-07-22 DIAGNOSIS — N1832 Chronic kidney disease, stage 3b: Secondary | ICD-10-CM

## 2023-07-22 DIAGNOSIS — N183 Chronic kidney disease, stage 3 unspecified: Secondary | ICD-10-CM

## 2023-07-22 DIAGNOSIS — Z1231 Encounter for screening mammogram for malignant neoplasm of breast: Secondary | ICD-10-CM

## 2023-07-22 DIAGNOSIS — G8929 Other chronic pain: Secondary | ICD-10-CM

## 2023-07-22 DIAGNOSIS — E1169 Type 2 diabetes mellitus with other specified complication: Secondary | ICD-10-CM

## 2023-07-22 DIAGNOSIS — Z7984 Long term (current) use of oral hypoglycemic drugs: Secondary | ICD-10-CM

## 2023-07-22 DIAGNOSIS — E1122 Type 2 diabetes mellitus with diabetic chronic kidney disease: Secondary | ICD-10-CM | POA: Diagnosis not present

## 2023-07-22 DIAGNOSIS — R3121 Asymptomatic microscopic hematuria: Secondary | ICD-10-CM

## 2023-07-22 DIAGNOSIS — Z8541 Personal history of malignant neoplasm of cervix uteri: Secondary | ICD-10-CM

## 2023-07-22 NOTE — Progress Notes (Signed)
 Please notify patient that her CT showed: - No acute GU findings; nothing to explain hematuria.  - Pancreatic cyst, small hiatal hernia, diverticulosis, and fatty liver. She is advised to follow up with her PCP / GI provider about those findings.

## 2023-07-22 NOTE — Progress Notes (Signed)
 Subjective:   Tamara Osborne August 26, 1975 07/22/2023  Chief Complaint  Patient presents with   Medical Management of Chronic Issues    41-month follow up; states she has been having blood in her urine and had a procedure performed by urology 5/21 and was told by urologist that they believe this is due to damage from radiation she had. Also has been having a lot of pain in her feet and right leg is still going numb.    HPI: Tamara Osborne presents today for re-assessment and management of chronic medical conditions.   CHRONIC HEMATURIA:  Patient has chronic UTI due to radiation from cerival cancer and is seeing urology for chronic hematuria and UTI's. She had a cystoscopy yesterday with Urology [facility] provider. She has beenon placed on estradiol cream. She was going to Pelvic Floor PT and continues to receive PT treatment.    JOINT PAIN AND NUMBNESS:  She states she has been referred to Rheumatology provider from her nephrologist due to chronic swelling and joint pain. She continues ot have numbness to her right thigh. She has seen Podiatry for ongoing left foot pain and received steroid injection with relief. She would like a referral to Aquatic therapy for chronic joint pain as she had great benefit in [facility] therapy prior.   CHRONIC KIDNEY DISEASE: Tamara Osborne presents for the medical management of Chronic Kidney Disease Stage 3. Patient does see Nephrology for management.  Patient is  adhering to renal diet. Patient is  on ACE1/ARB therapy. (Amlodipine - valsartan ) Patient is  avoiding NSAIDS.     Does not attend dialysis and does not perform peritoneal dialysis.   Lab Results  Component Value Date   NA 140 06/09/2023   K 4.8 06/09/2023   CO2 20 08/06/2022   GLUCOSE 94 08/06/2022   BUN 23 (A) 06/09/2023   CREATININE 1.4 (A) 06/09/2023   CALCIUM  9.4 06/09/2023   EGFR 48.0 06/18/2023   GFRNONAA 56 (L) 10/23/2021    GENETIC CANCER CONCERNS:   Patient states she has significant concerns of genetic predisposition to certain cancers and would like referral to genetics to discuss family hx of cancer which is prevalent. She has a hx of cervical cancer and reports her sister is undergoing treatment for cervical cancer currently. She is also due for a mammogram.    DIABETES MELLITUS: Tamara Osborne presents for the medical management of diabetes.  Current diabetes medication regimen: Semaglutide  1mg   Patient is not checking BS regularly.  Patient is  checking their feet regularly.  Denies polydipsia, polyphagia, polyuria, open wounds or ulcers on feet.  Lab Results  Component Value Date   HGBA1C 5.9 (H) 08/06/2022    Foot Exam: No foot exam found Lab Results  Component Value Date   LABMICR See below: 07/21/2023   LABMICR See below: 07/14/2023    Wt Readings from Last 3 Encounters:  07/22/23 230 lb 14.4 oz (104.7 kg)  06/29/23 230 lb (104.3 kg)  06/10/23 232 lb (105.2 kg)    The following portions of the patient's history were reviewed and updated as appropriate: past medical history, past surgical history, family history, social history, allergies, medications, and problem list.   Patient Active Problem List   Diagnosis Date Noted   Morbid obesity (HCC) 07/10/2023   History of radiation therapy 05/21/2023   Difficult intravenous access 05/21/2023   HSV-2 infection 07/13/2022   Dyspareunia in female 10/23/2021   Hyperlipidemia associated with type 2 diabetes mellitus (  HCC) 07/03/2021   Type 2 diabetes mellitus with stage 3b chronic kidney disease, without long-term current use of insulin  (HCC) 11/15/2020   OSA (obstructive sleep apnea) 07/31/2020   Other hyperlipidemia 01/10/2020   Insulin  resistance 01/10/2020   Class 2 severe obesity with serious comorbidity and body mass index (BMI) of 39.0 to 39.9 in adult [facility]) 01/10/2020   Peripheral 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) 04/25/2019   CKD (chronic kidney  disease), symptom management only, stage 3 (moderate) (HCC) 03/30/2019   Generalized anxiety disorder 03/30/2019   Hypertension associated with type 2 diabetes mellitus (HCC)    Squamous cell carcinoma of cervix (HCC) 03/23/2019   Deficiency anemia 03/22/2019   Past Medical History:  Diagnosis Date   Anemia    Angio-edema    Anxiety    Bartholin cyst    Cervical cancer (HCC)    Chronic kidney disease    Constipation    Depression    Deviated septum    Diabetes mellitus without complication (HCC)    Difficult intravenous access    used port for 05-09-2019 surgery   Fibroids    History of blood transfusion    2 units given 04-26-2019, has had total of 8 units since jan 2021   History of blood transfusion 1 unit given 05-30-19   iv fluids also given   History of cervical cancer 07/16/2022   History of kidney problems    History of radiation therapy 03/23/2019-05/04/2019   Cervical external beam   Dr Retta Caster   History of radiation therapy 05/09/2019-06/05/2019   vaginal brachytherapy   Dr Retta Caster   History of recent blood transfusion 05/16/2019   2 units given  per dr Bonita Bussing at cancer center   Hypertension    Neuropathy    both thumbs   Obesity    Palpitations    PONV (postoperative nausea and vomiting)    Prediabetes    Preeclampsia 2006   Sleep apnea    no cpap used insurance would not cover, osa severe per pt   SOB (shortness of breath)    Squamous cell carcinoma of cervix (HCC) 03/23/2019   Swallowing difficulty    Symptomatic skin lesion 07/14/2022   Vitamin D  deficiency 01/10/2020   Past Surgical History:  Procedure Laterality Date   CESAREAN SECTION WITH BILATERAL TUBAL LIGATION  11/10/2004       DILATION AND CURETTAGE OF UTERUS  1997   IR IMAGING GUIDED PORT INSERTION  04/04/2019   IR REMOVAL TUN ACCESS W/ PORT W/O FL MOD SED  10/27/2021   OPERATIVE ULTRASOUND N/A 05/09/2019   Procedure: OPERATIVE ULTRASOUND;  Surgeon: Retta Caster, MD;  [facility];  Service: Urology;  Laterality: N/A;   OPERATIVE ULTRASOUND N/A 05/15/2019   Procedure: OPERATIVE ULTRASOUND;  Surgeon: Retta Caster, MD;  Location: [facility];  Service: Urology;  Laterality: N/A;   OPERATIVE ULTRASOUND N/A 05/22/2019   Procedure: OPERATIVE ULTRASOUND;  Surgeon: Retta Caster, MD;  Location: [facility];  Service: Urology;  Laterality: N/A;   OPERATIVE ULTRASOUND N/A 05/29/2019   Procedure: OPERATIVE ULTRASOUND;  Surgeon: Retta Caster, MD;  Location: [facility];  Service: Urology;  Laterality: N/A;   OPERATIVE ULTRASOUND N/A 06/05/2019   Procedure: OPERATIVE ULTRASOUND;  Surgeon: Retta Caster, MD;  Location: [facility];  Service: Urology;  Laterality: N/A;   TANDEM RING INSERTION N/A 05/09/2019   Procedure: TANDEM RING INSERTION;  Surgeon: Retta Caster, MD;  [facility];  Service: Urology;  Laterality: N/A;   TANDEM RING INSERTION N/A 05/15/2019   Procedure: TANDEM RING INSERTION;  Surgeon: Retta Caster, MD;  Location: [facility];  Service: Urology;  Laterality: N/A;   TANDEM RING INSERTION N/A 05/22/2019   Procedure: TANDEM RING INSERTION;  Surgeon: Retta Caster, MD;  Location: [facility];  Service: Urology;  Laterality: N/A;   TANDEM RING INSERTION N/A 05/29/2019   Procedure: TANDEM RING INSERTION;  Surgeon: Retta Caster, MD;  Location: [facility];  Service: Urology;  Laterality: N/A;   TANDEM RING INSERTION N/A 06/05/2019   Procedure: TANDEM RING INSERTION;  Surgeon: Retta Caster, MD;  Location: [facility];  Service: Urology;  Laterality: N/A;   Family History  Problem Relation Age of Onset   Brain cancer Mother        lung cancer to brain   Hypertension Mother    Cancer Mother    Depression Mother    Anxiety disorder Mother    Diabetes Father    Kidney disease Father    Cancer Father        kidney  cancer   Heart failure Sister    Heart attack Sister    Heart failure Paternal Aunt    Cancer Maternal Grandmother    Kidney failure Maternal Grandfather    Outpatient Medications Prior to Visit  Medication Sig Dispense Refill   Accu-Chek Softclix Lancets lancets Use to check blood sugar TID. 100 each 2   amLODipine -valsartan  (EXFORGE ) 10-320 MG tablet Take 1 tablet by mouth daily. 90 tablet 3   azelastine  (ASTELIN ) 0.1 % nasal spray Place 1 spray into both nostrils 2 (two) times daily as needed. Use in each nostril as directed 30 mL 5   Blood Glucose Monitoring Suppl (ACCU-CHEK GUIDE) w/Device KIT Use to check blood sugar TID. 1 kit 0   buPROPion (WELLBUTRIN XL) 150 MG 24 hr tablet Take 150 mg by mouth daily.     Cholecalciferol 50 MCG (2000 UT) TABS Take by mouth.     conjugated estrogens  (PREMARIN ) vaginal cream Place 1 Applicatorful vaginally every other day. 42.5 g 12   EPINEPHrine  (EPIPEN  2-PAK) 0.3 mg/0.3 mL IJ SOAJ injection Inject 0.3 mg into the muscle as needed for anaphylaxis. 2 each 1   fluticasone  (FLONASE ) 50 MCG/ACT nasal spray Place 2 sprays into both nostrils daily. 16 g 5   glucose blood (ACCU-CHEK GUIDE) test strip Use to check blood sugar TID. 100 each 2   ipratropium (ATROVENT ) 0.06 % nasal spray Place 2 sprays into both nostrils 3 (three) times daily as needed for rhinitis (drainage). 15 mL 5   levocetirizine (XYZAL ) 5 MG tablet Take 1 tablet (5 mg total) by mouth every evening. (Patient taking differently: Take 5 mg by mouth as needed for allergies.) 90 tablet 1   RESTASIS 0.05 % ophthalmic emulsion 1 drop 2 (two) times daily.     Semaglutide , 1 MG/DOSE, 4 MG/3ML SOPN Inject 1 mg as directed once a week. 3 mL 0   nitrofurantoin , macrocrystal-monohydrate, (MACROBID ) 100 MG capsule Take 1 capsule (100 mg total) by mouth every 12 (twelve) hours. 14 capsule 0   No facility-administered medications prior to visit.   Allergies  Allergen Reactions   Penicillins     Not  sure a child allergy    Shellfish Allergy  Swelling    Seafood also any kind   Sulfa Antibiotics     Not sure a child allergy   ROS: A complete ROS was performed with pertinent positives/negatives noted in the HPI. The remainder of the ROS are negative.    Objective:   Today's Vitals   07/22/23 0931 07/22/23 1018  BP: (!) 148/67 138/76  Pulse: 73   SpO2: 98%   Weight: 230 lb 14.4 oz (104.7 kg)   Height: 5\' 1"  (1.549 m)     Physical Exam          GENERAL: Well-appearing, in NAD. Well nourished.  SKIN: Pink, warm and dry. No rash, lesion, ulceration, or ecchymoses.  Head: Normocephalic. NECK: Trachea midline. Full ROM w/o pain or tenderness.  RESPIRATORY: Chest wall symmetrical. Respirations even and non-labored. MSK: Muscle tone and strength appropriate for age. Joints w/o tenderness, redness, or swelling.  EXTREMITIES: Without clubbing, cyanosis NEUROLOGIC: No motor or sensory deficits. Steady, even gait. C2-C12 intact.  PSYCH/MENTAL STATUS: Alert, oriented x 3. Cooperative, appropriate mood and affect.   Health Maintenance Due  Topic Date Due   COVID-19 Vaccine (1) Never done   HIV Screening  Never done   Hepatitis C Screening  Never done   Colonoscopy  Never done   MAMMOGRAM  09/19/2021   HEMOGLOBIN A1C  02/05/2023   OPHTHALMOLOGY EXAM  04/07/2023    No results found for any visits on 07/22/23.  The 10-year ASCVD risk score (Arnett DK, et al., 2019) is: 4.7%     Assessment & Plan:  1. Breast cancer screening by mammogram (Primary) Patient is overdue. Mammogram ordered.  - MM 3D SCREENING MAMMOGRAM BILATERAL BREAST; Future  2. CKD (chronic kidney disease), symptom management only, stage 3 (moderate) (HCC) Stable. Managed by Nephrology. Discussed renal diet, continue medication and hydration daily.   3. Type 2 diabetes mellitus with stage 3b chronic kidney disease, without long-term current use of insulin  (HCC) Controlled. Continue current medication  regimen, encouraged diet and regular exercise.  - Hemoglobin A1c; Future  4. Hyperlipidemia associated with type 2 diabetes mellitus (HCC) Recommend continuing dietary changes and repeat her labs in 3-4 months. Discussed dietary recommendations with patient.  - Lipid panel; Future  5. History of cervical cancer Discussed possible genetic predisposition and referral palced to Genetics per patient request to discuss further for counseling.  - Ambulatory referral to Genetics  6. Chronic joint pain Patient scheduled to see Rheumatology. Referral placed to Aquatic Therapy with PT at [facility]  - Ambulatory referral to Physical Therapy  7. Screening for colon cancer Due for cscope. Referral placed.  - Ambulatory referral to Gastroenterology  8. Asymptomatic microscopic hematuria Managed by Urology currently. Recommend patient follow guidelines.   No orders of the defined types were placed in this encounter.  Lab Orders         Lipid panel         Hemoglobin A1c     Return in about 4 months (around 11/22/2023) for DIABETES CHECK UP, HYPERTENSION, ANNUAL PHYSICAL.    Patient to reach out to office if new, worrisome, or unresolved symptoms arise or if no improvement in patient's condition. Patient verbalized understanding and is agreeable to treatment plan. All questions answered to patient's satisfaction.    Nonda Bays, [place]

## 2023-07-22 NOTE — Patient Instructions (Addendum)
 Schedule your eye exam. ?

## 2023-07-23 ENCOUNTER — Telehealth (HOSPITAL_BASED_OUTPATIENT_CLINIC_OR_DEPARTMENT_OTHER): Payer: Self-pay | Admitting: Family Medicine

## 2023-07-23 NOTE — Telephone Encounter (Signed)
 Patient came by the office after seeing results from the CT that urology had ordered. Went out to the lobby to speak with pt who stated after she saw the results, she reached out to urology about it and they told her to follow up with PCP.  Told pt that I would discuss this with Trula Gable and we would then reach back out to her and she verbalized understanding.  Alexis, please advise.

## 2023-07-28 LAB — HM DIABETES EYE EXAM

## 2023-07-29 ENCOUNTER — Other Ambulatory Visit (HOSPITAL_BASED_OUTPATIENT_CLINIC_OR_DEPARTMENT_OTHER): Payer: Self-pay | Admitting: Family Medicine

## 2023-07-29 DIAGNOSIS — K862 Cyst of pancreas: Secondary | ICD-10-CM

## 2023-08-02 ENCOUNTER — Telehealth: Payer: Self-pay | Admitting: Gastroenterology

## 2023-08-02 NOTE — Telephone Encounter (Signed)
 I have reviewed the patient's CT scan. The pancreas cyst is 10 mm in size. He will not need an endoscopic ultrasound as of yet. Would recommend patient have an MRI/MRCP, based on that can determine the surveillance protocol that would be required. She can have this scheduled under my name, if she wants to follow-up with [facility]. If she gets that scheduled, she can be seen with me or one of the APP's after the MRI/MRCP has been completed. If she wants to be seen in clinic first to discuss things, schedule her for clinic visit with me or APP. Also find out if she is ever done any colon cancer screening (I cannot see Cologuard testing or prior colonoscopy). Thanks. GM

## 2023-08-02 NOTE — Telephone Encounter (Signed)
 Good morning Dr. Brice Campi,   We received an urgent referral for this patient to be scheduled for a pancreatic cyst. Patient would like to schedule with our practice. Would you please review and advise on scheduling?  Thank you.

## 2023-08-04 ENCOUNTER — Other Ambulatory Visit (HOSPITAL_COMMUNITY): Payer: Self-pay | Admitting: Family Medicine

## 2023-08-04 ENCOUNTER — Other Ambulatory Visit (HOSPITAL_BASED_OUTPATIENT_CLINIC_OR_DEPARTMENT_OTHER): Payer: Self-pay | Admitting: *Deleted

## 2023-08-04 DIAGNOSIS — N1832 Chronic kidney disease, stage 3b: Secondary | ICD-10-CM

## 2023-08-04 DIAGNOSIS — E1169 Type 2 diabetes mellitus with other specified complication: Secondary | ICD-10-CM

## 2023-08-05 ENCOUNTER — Ambulatory Visit (INDEPENDENT_AMBULATORY_CARE_PROVIDER_SITE_OTHER): Admitting: Family Medicine

## 2023-08-05 ENCOUNTER — Ambulatory Visit (HOSPITAL_BASED_OUTPATIENT_CLINIC_OR_DEPARTMENT_OTHER): Payer: Self-pay | Admitting: Family Medicine

## 2023-08-05 ENCOUNTER — Encounter (INDEPENDENT_AMBULATORY_CARE_PROVIDER_SITE_OTHER): Payer: Self-pay | Admitting: Family Medicine

## 2023-08-05 VITALS — BP 130/62 | HR 75 | Temp 98.1°F | Ht 61.0 in | Wt 226.0 lb

## 2023-08-05 DIAGNOSIS — E1122 Type 2 diabetes mellitus with diabetic chronic kidney disease: Secondary | ICD-10-CM | POA: Diagnosis not present

## 2023-08-05 DIAGNOSIS — K760 Fatty (change of) liver, not elsewhere classified: Secondary | ICD-10-CM | POA: Diagnosis not present

## 2023-08-05 DIAGNOSIS — N1832 Chronic kidney disease, stage 3b: Secondary | ICD-10-CM | POA: Diagnosis not present

## 2023-08-05 DIAGNOSIS — Z6841 Body Mass Index (BMI) 40.0 and over, adult: Secondary | ICD-10-CM

## 2023-08-05 DIAGNOSIS — Z7985 Long-term (current) use of injectable non-insulin antidiabetic drugs: Secondary | ICD-10-CM

## 2023-08-05 LAB — HEMOGLOBIN A1C
Est. average glucose Bld gHb Est-mCnc: 114 mg/dL
Hgb A1c MFr Bld: 5.6 % (ref 4.8–5.6)

## 2023-08-05 LAB — LIPID PANEL
Chol/HDL Ratio: 5.2 ratio — ABNORMAL HIGH (ref 0.0–4.4)
Cholesterol, Total: 213 mg/dL — ABNORMAL HIGH (ref 100–199)
HDL: 41 mg/dL (ref >39)
LDL Chol Calc (NIH): 149 mg/dL — ABNORMAL HIGH (ref 0–99)
Triglycerides: 125 mg/dL (ref 0–149)
VLDL Cholesterol Cal: 23 mg/dL (ref 5–40)

## 2023-08-05 MED ORDER — SEMAGLUTIDE (1 MG/DOSE) 4 MG/3ML ~~LOC~~ SOPN: 1.0000 mg | PEN_INJECTOR | SUBCUTANEOUS | 0 refills | Status: DC

## 2023-08-05 NOTE — Progress Notes (Signed)
 SUBJECTIVE:  Chief Complaint: Obesity  Interim History: Since last appointment patient has been logging food in her food app.  She couldn't change the calorie goal on her app.  She hs been focusing on intake of protein.  She has had a CT scan recently and was referred for another scan and encouraged to follow up with PCP and GI.  Patient started back at school so will be doing school work throughout the summer.  Tamara Osborne is here to discuss her progress with her obesity treatment plan. She is on the keeping a food journal and adhering to recommended goals of 1400-1500 calories and 95 grams of protein and states she is following her eating plan approximately 95 % of the time. She states she is not exercising.  OBJECTIVE: Visit Diagnoses: Problem List Items Addressed This Visit       Digestive   Hepatic steatosis   Patient has been told she has history of hepatic steatosis.  We discussed pathophysiology of that today.  We agreed to implement lifestyle changes and patient to be consistent with her food logging at this time and continue her ozempic .        Endocrine   Type 2 diabetes mellitus with stage 3b chronic kidney disease, without long-term current use of insulin  (HCC) - Primary   On ozempic  weekly without any GI side effects.  She is on the 1mg  dose.  Needs a refill today.  Recent A1c of 5.6 which is improved from previously level.      Relevant Medications   Semaglutide , 1 MG/DOSE, 4 MG/3ML SOPN     Other   Morbid obesity (HCC)   Anthropometric Measurements Height: 5' 1 (1.549 m) Weight: 226 lb (102.5 kg) BMI (Calculated): 42.72 Weight at Last Visit: 230 lb Weight Lost Since Last Visit: 4 Weight Gained Since Last Visit: 0 Starting Weight: 208 lb Total Weight Loss (lbs): 0 lb (0 kg) Body Composition  Body Fat %: 45.3 % Fat Mass (lbs): 102.4 lbs Muscle Mass (lbs): 117.6 lbs Total Body Water (lbs): 85 lbs Visceral Fat Rating : 14 Other Clinical Data Today's Visit #:  15 Starting Date: 09/20/19 Comments: Cat 2       Relevant Medications   Semaglutide , 1 MG/DOSE, 4 MG/3ML SOPN   Other Visit Diagnoses       BMI 40.0-44.9, adult (HCC), Current BMI 43.46       Relevant Medications   Semaglutide , 1 MG/DOSE, 4 MG/3ML SOPN       No data recorded       08/05/2023   11:00 AM 07/22/2023   10:18 AM 07/22/2023    9:31 AM  Vitals with BMI  Height 5' 1  5' 1  Weight 226 lbs  230 lbs 14 oz  BMI 42.72  43.65  Systolic 130 138 782  Diastolic 62 76 67  Pulse 75  73      ASSESSMENT AND PLAN:  Diet: Elim is currently in the action stage of change. As such, her goal is to continue with weight loss efforts and has agreed to keeping a food journal and adhering to recommended goals of 1400-1500 calories and 95 or more grams of protein daily.   Exercise:  For substantial health benefits, adults should do at least 150 minutes (2 hours and 30 minutes) a week of moderate-intensity, or 75 minutes (1 hour and 15 minutes) a week of vigorous-intensity aerobic physical activity, or an equivalent combination of moderate- and vigorous-intensity aerobic activity. Aerobic activity should be  performed in episodes of at least 10 minutes, and preferably, it should be spread throughout the week.  Behavior Modification:  We discussed the following Behavioral Modification Strategies today: increasing lean protein intake, decreasing simple carbohydrates, increasing vegetables, meal planning and cooking strategies, keeping healthy foods in the home, planning for success, and keep a strict food journal.   Return in about 4 weeks (around 09/02/2023).   She was informed of the importance of frequent follow up visits to maximize her success with intensive lifestyle modifications for her multiple health conditions.  Attestation Statements:   Reviewed by clinician on day of visit: allergies, medications, problem list, medical history, surgical history, family history, social  history, and previous encounter notes.     Donaciano Frizzle, MD

## 2023-08-05 NOTE — Assessment & Plan Note (Signed)
 On ozempic  weekly without any GI side effects.  She is on the 1mg  dose.  Needs a refill today.  Recent A1c of 5.6 which is improved from previously level.

## 2023-08-05 NOTE — Progress Notes (Signed)
 Hi Tamara Osborne,  Your A1C has improved and is down to 5.6. Your triglycerides are also decreased. Your cholesterol has increased slightly, please continue your dietary changes with heart healthy diet and regular exercise to lower this. I would recommend a statin therapy (cholesterol medication) to lower your risk of heart disease. If you are agreeable to this, please let me know.

## 2023-08-09 NOTE — Telephone Encounter (Signed)
 Hey Rovonda, could you please assist in scheduling patient for MRI/MRCP? Thanks.

## 2023-08-09 NOTE — Telephone Encounter (Signed)
 I can, but did you speak with the patient? Does she want to be scheduled for an appointment prior to being scheduled for the MRI/ MRCP? Or do I need to call patient and discuss Dr Marolyn Sis recommendations?

## 2023-08-12 NOTE — Telephone Encounter (Signed)
 Called and spoke with patient, states she does not know what an EUS or a pancreatic cyst is, she would like to discuss further and clarify it. Please advise.

## 2023-08-12 NOTE — Telephone Encounter (Signed)
 FYI- Dr.Mansouraty  Called and spoke with patient. Explained in detail the recommendations from Dr Brice Campi. Patient would like to wait until visit and discuss further. Patient states that she was not aware that she had a pancreatic cyst and has questions for the MD. Patient has been scheduled for 10/06/23 at 3:50pm to see Dr.Mansouraty.

## 2023-08-13 NOTE — Telephone Encounter (Signed)
Thank you for this update. GM

## 2023-08-15 DIAGNOSIS — K760 Fatty (change of) liver, not elsewhere classified: Secondary | ICD-10-CM | POA: Insufficient documentation

## 2023-08-15 NOTE — Assessment & Plan Note (Signed)
 Patient has been told she has history of hepatic steatosis.  We discussed pathophysiology of that today.  We agreed to implement lifestyle changes and patient to be consistent with her food logging at this time and continue her ozempic .

## 2023-08-15 NOTE — Assessment & Plan Note (Signed)
 Anthropometric Measurements Height: 5' 1 (1.549 m) Weight: 226 lb (102.5 kg) BMI (Calculated): 42.72 Weight at Last Visit: 230 lb Weight Lost Since Last Visit: 4 Weight Gained Since Last Visit: 0 Starting Weight: 208 lb Total Weight Loss (lbs): 0 lb (0 kg) Body Composition  Body Fat %: 45.3 % Fat Mass (lbs): 102.4 lbs Muscle Mass (lbs): 117.6 lbs Total Body Water (lbs): 85 lbs Visceral Fat Rating : 14 Other Clinical Data Today's Visit #: 63 Starting Date: 09/20/19 Comments: Cat 2

## 2023-08-30 NOTE — Therapy (Signed)
 OUTPATIENT PHYSICAL THERAPY THORACOLUMBAR EVALUATION   Patient Name: Tamara Osborne MRN: [id number] DOB:April 22, 1975, 48 y.o., female Today's Date: 08/31/2023  END OF SESSION:  PT End of Session - 08/31/23 1255     Visit Number 1    Date for PT Re-Evaluation 11/12/23    Authorization Type [employer] med[employer]    Authorization - Visit Number 0    PT Start Time 1146    PT Stop Time 1225    PT Time Calculation (min) 39 min    Activity Tolerance Patient tolerated treatment well    Behavior During Therapy WFL for tasks assessed/performed          Past Medical History:  Diagnosis Date   Anemia    Angio-edema    Anxiety    Bartholin cyst    Cervical cancer (HCC)    Chronic kidney disease    Constipation    Depression    Deviated septum    Diabetes mellitus without complication (HCC)    Difficult intravenous access    used port for 05-09-2019 surgery   Fibroids    History of blood transfusion    2 units given 04-26-2019, has had total of 8 units since jan 2021   History of blood transfusion 1 unit given 05-30-19   iv fluids also given   History of cervical cancer 07/16/2022   History of kidney problems    History of radiation therapy 03/23/2019-05/04/2019   Cervical external beam   Dr Lynwood Nasuti   History of radiation therapy 05/09/2019-06/05/2019   vaginal brachytherapy   Dr Lynwood Nasuti   History of recent blood transfusion 05/16/2019   2 units given  per dr melrose at cancer center   Hypertension    Neuropathy    both thumbs   Obesity    Palpitations    PONV (postoperative nausea and vomiting)    Prediabetes    Preeclampsia 2006   Sleep apnea    no cpap used insurance would not cover, osa severe per pt   SOB (shortness of breath)    Squamous cell carcinoma of cervix (HCC) 03/23/2019   Swallowing difficulty    Symptomatic skin lesion 07/14/2022   Vitamin D  deficiency 01/10/2020   Past Surgical History:  Procedure Laterality Date   CESAREAN SECTION WITH BILATERAL  TUBAL LIGATION  11/10/2004       DILATION AND CURETTAGE OF UTERUS  1997   IR IMAGING GUIDED PORT INSERTION  04/04/2019   IR REMOVAL TUN ACCESS W/ PORT W/O FL MOD SED  10/27/2021   OPERATIVE ULTRASOUND N/A 05/09/2019   Procedure: OPERATIVE ULTRASOUND;  Surgeon: Nasuti Lynwood, MD;  Location: [facility];  Service: Urology;  Laterality: N/A;   OPERATIVE ULTRASOUND N/A 05/15/2019   Procedure: OPERATIVE ULTRASOUND;  Surgeon: Nasuti Lynwood, MD;  Location: [facility];  Service: Urology;  Laterality: N/A;   OPERATIVE ULTRASOUND N/A 05/22/2019   Procedure: OPERATIVE ULTRASOUND;  Surgeon: Nasuti Lynwood, MD;  Location: [facility];  Service: Urology;  Laterality: N/A;   OPERATIVE ULTRASOUND N/A 05/29/2019   Procedure: OPERATIVE ULTRASOUND;  Surgeon: Nasuti Lynwood, MD;  Location: [facility];  Service: Urology;  Laterality: N/A;   OPERATIVE ULTRASOUND N/A 06/05/2019   Procedure: OPERATIVE ULTRASOUND;  Surgeon: Nasuti Lynwood, MD;  Location: [facility];  Service: Urology;  Laterality: N/A;   TANDEM RING INSERTION N/A 05/09/2019   Procedure: TANDEM RING INSERTION;  Surgeon: Nasuti Lynwood, MD;  Location: [facility];  Service: Urology;  Laterality: N/A;   TANDEM RING INSERTION N/A 05/15/2019   Procedure: TANDEM RING INSERTION;  Surgeon: Shannon Agent, MD;  Location: [facility];  Service: Urology;  Laterality: N/A;   TANDEM RING INSERTION N/A 05/22/2019   Procedure: TANDEM RING INSERTION;  Surgeon: Shannon Agent, MD;  Location: [facility];  Service: Urology;  Laterality: N/A;   TANDEM RING INSERTION N/A 05/29/2019   Procedure: TANDEM RING INSERTION;  Surgeon: Shannon Agent, MD;  Location: [facility];  Service: Urology;  Laterality: N/A;   TANDEM RING INSERTION N/A 06/05/2019   Procedure: TANDEM RING INSERTION;  Surgeon: Shannon Agent, MD;  Location: [facility];  Service:  Urology;  Laterality: N/A;   Patient Active Problem List   Diagnosis Date Noted   Hepatic steatosis 08/15/2023   Morbid obesity (HCC) 07/10/2023   History of radiation therapy 05/21/2023   Difficult intravenous access 05/21/2023   HSV-2 infection 07/13/2022   Dyspareunia in female 10/23/2021   Hyperlipidemia associated with type 2 diabetes mellitus (HCC) 07/03/2021   Type 2 diabetes mellitus with stage 3b chronic kidney disease, without long-term current use of insulin  (HCC) 11/15/2020   OSA (obstructive sleep apnea) 07/31/2020   Other hyperlipidemia 01/10/2020   Insulin  resistance 01/10/2020   Class 2 severe obesity with serious comorbidity and body mass index (BMI) of 39.0 to 39.9 in adult [place]) 01/10/2020   Peripheral 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) 04/25/2019   CKD (chronic kidney disease), symptom management only, stage 3 (moderate) (HCC) 03/30/2019   Generalized anxiety disorder 03/30/2019   Hypertension associated with type 2 diabetes mellitus (HCC)    Squamous cell carcinoma of cervix (HCC) 03/23/2019   Deficiency anemia 03/22/2019    PCP: n/a  REFERRING PROVIDER: Knute Thersia Bitters, FNP   REFERRING DIAG: M25.50,G89.29 (ICD-10-CM) - Chronic joint pain   Rationale for Evaluation and Treatment: Rehabilitation  THERAPY DIAG:  Bilateral foot pain  Numbness and tingling of right lower extremity  Muscle weakness (generalized)  ONSET DATE: x 1 year  SUBJECTIVE:                                                                                                                                                                                           SUBJECTIVE STATEMENT: My left foot has a lot of pain.  Had a cortizone shot and it is better.  Since cancer treatments in 2021 I have a lot of neuropathies in my lower legs and feet.  I also have numbness in right knee through hip after standing for up to 10 minutes.  It happens everyday.  My joints  hurt every day hips and  knees.  Cancer doctors says my leg/and joint pain is unrelated to the cancer. Pain greater in am when first getting up.  PERTINENT HISTORY:  hx of hypertension, preeclampsia, hyperlipidemia, OSA, diabetes, squamous cell carcinoma of cervix s/p radiation and chemotherapy, morbid obesity. Has had 2 other episodes of aquatic intervention since 2022  PAIN:  Are you having pain? Yes: NPRS scale: current normal 4/10; Pain location: legs and feet Pain description: achy; numbness tingling Aggravating factors: standing >10 mins Relieving factors: sitting x 10 minutes  PRECAUTIONS: None   RED FLAGS: None      WEIGHT BEARING RESTRICTIONS: No   FALLS:  Has patient fallen in last 6 months? No   LIVING ENVIRONMENT: Lives with: lives with their partner Lives in: House/apartment Stairs: split foyer, 6 steps up/down, bilat HR Has following equipment at home: None   PLOF: Independent   OCCUPATION: not working  PLOF: Independent  PATIENT GOALS: get better, find out what's going on  in my back  NEXT MD VISIT: weekly  OBJECTIVE:  Note: Objective measures were completed at Evaluation unless otherwise noted.  DIAGNOSTIC FINDINGS:  N/a As per pt Oa in left foot  PATIENT SURVEYS:  LEFS  Extreme difficulty/unable (0), Quite a bit of difficulty (1), Moderate difficulty (2), Little difficulty (3), No difficulty (4) Survey date:    Any of your usual work, housework or school activities 2  2. Usual hobbies, recreational or sporting activities 1  3. Getting into/out of the bath 2  4. Walking between rooms 3  5. Putting on socks/shoes 1  6. Squatting  2  7. Lifting an object, like a bag of groceries from the floor 3  8. Performing light activities around your home 3  9. Performing heavy activities around your home 1  10. Getting into/out of a car 2  11. Walking 2 blocks 1  12. Walking 1 mile 0  13. Going up/down 10 stairs (1 flight) 1  14. Standing for 1 hour 0  15.  sitting for 1  hour 0  16. Running on even ground 0  17. Running on uneven ground 0  18. Making sharp turns while running fast 0  19. Hopping  0  20. Rolling over in bed 2  Score total:  24/80     COGNITION: Overall cognitive status: Within functional limits for tasks assessed     SENSATION: Peripheral neuropathies bilat feet  MUSCLE LENGTH: Hamstrings: right hamstring tightness> left   POSTURE: rounded shoulders, forward head, and decreased lumbar lordosis   PALPATION: No TTP  LUMBAR ROM:   wfl  LOWER EXTREMITY ROM:     Full ROM in bilateral hips. Tightness in the hip adductors and right hamstring  LOWER EXTREMITY strength:    HD lbs Right eval Left eval  Hip flexion 35.2 50.0  Hip extension    Hip abduction 21.2 30.9  Hip adduction    Hip internal rotation    Hip external rotation    Knee flexion    Knee extension 20.8 26.3  Ankle dorsiflexion    Ankle plantarflexion    Ankle inversion    Ankle eversion     (Blank rows = not tested)   FUNCTIONAL TESTS:  5 times sit to stand: 18.79 Timed up and go (TUG): 14.94s   4 stage balance: passed 1&2. Tandem stance small steps to gain position hold x 8s; SLS x6 step GAIT: Distance walked: 500 ft Assistive device utilized: None Level of  assistance: Complete Independence Comments: initial gait slightly antalgic.  TREATMENT  Eval Self care:Posture and Optometrist instruction. Importance of continuing with exercises instructed by prior PT episodes for continued management of chronic conditions. Gaining pool access; return to walking dog for exercise.                                                                                                                               PATIENT EDUCATION:  Education details: Discussed eval findings, rehab rationale, aquatic program progression/POC and pools in area. Patient is in agreement  Person educated: Patient Education method: Explanation Education comprehension: verbalized  understanding  HOME EXERCISE PROGRAM: [id number] from previous aquatic episdoe  ASSESSMENT:  CLINICAL IMPRESSION: Patient is a 48 y.o. f who was seen today for physical therapy evaluation and treatment for Chronic joint pain . She is well known to [facility] clinics as well as aquatic therapy finishing an episode of pelvic floor in April 2024 and in aquatics 1 year ago for similar dx.  She reports she has been having constant numbness, tingling  in right anterior thigh in past year daily that occurs with standing >10-15 minutes.  She has constant feet pain and toe numbness due to cancer treatments. No falls.  Testing demonstrates rle stronger than left.  She reports she is limited with functional amb due to LE pain. She has aquatic HEP from previous episode. Plan to see her in aquatics and re establish as well as modify/add to program for indep completion.  OBJECTIVE IMPAIRMENTS: Abnormal gait, decreased activity tolerance, decreased balance, difficulty walking, decreased strength, and impaired sensation.    ACTIVITY LIMITATIONS: standing, stairs, and locomotion level   PARTICIPATION LIMITATIONS: cleaning and community activity   PERSONAL FACTORS: Time since onset of injury/illness/exacerbation and 1-2 comorbidities: PMH are also affecting patient's functional outcome.    REHAB POTENTIAL: Good   CLINICAL DECISION MAKING: Evolving/moderate complexity   EVALUATION COMPLEXITY: Moderate   GOALS: Goals reviewed with patient? Yes  SHORT TERM GOALS: Target date: 10/01/23  Pt will tolerate full aquatic sessions consistently without increase in pain and with improving function to demonstrate good toleration and effectiveness of intervention.  Baseline: Goal status: INITIAL  2.  Pt will report gaining pool access Baseline:  Goal status: INITIAL    LONG TERM GOALS: Target date: 11/12/23  Pt to improve on LEFS by at least 9 point to demonstrate statistically significant Improvement in  function. Baseline: 24/80 Goal status: INITIAL  2.  Pt will be indep with aquatic final HEP for continued management of condition Baseline:  Goal status: INITIAL  3.  Pt will report decrease in right le numbness and tingling by 50% Baseline:  Goal status: INITIAL  4.  Pt will report walking dog up to 3 x week to and from mailbox without SOB or excessive pain. Baseline:  Goal status: INITIAL    PLAN:  PT FREQUENCY: 1x/week  PT DURATION: 10 weeks will be delayed 4  weeks due to scheduling conflicts. ^ visit only likely  PLANNED INTERVENTIONS: 97164- PT Re-evaluation, 97750- Physical Performance Testing, 97110-Therapeutic exercises, 97530- Therapeutic activity, [id number]- Neuromuscular re-education, 97535- Self Care, [id number]- Manual therapy, [phone]- Gait training, [phone]- Orthotic Initial, [phone]- Aquatic Therapy, [phone] (1-2 muscles), [id number] (3+ muscles)- Dry Needling, Patient/Family education, Balance training, Stair training, Taping, Joint mobilization, DME instructions, Cryotherapy, and Moist heat.  PLAN FOR NEXT SESSION: Aquatics for le and core strengthening, balance retraining and pain management. HEP   Ronal [place]) Loghan Kurtzman MPT 08/31/23 1:03 PM [facility] [address] [place], [place], [address] Phone: [phone]   Fax:  [phone]  For all possible CPT codes, reference the Planned Interventions line above.     Check all conditions that are expected to impact treatment: {Conditions expected to impact treatment:Morbid obesity, Diabetes mellitus, and Musculoskeletal disorders   If treatment provided at initial evaluation, no treatment charged due to lack of authorization.

## 2023-08-31 ENCOUNTER — Other Ambulatory Visit: Payer: Self-pay

## 2023-08-31 ENCOUNTER — Ambulatory Visit (HOSPITAL_BASED_OUTPATIENT_CLINIC_OR_DEPARTMENT_OTHER): Attending: Family Medicine | Admitting: Physical Therapy

## 2023-08-31 ENCOUNTER — Encounter (HOSPITAL_BASED_OUTPATIENT_CLINIC_OR_DEPARTMENT_OTHER): Payer: Self-pay | Admitting: Physical Therapy

## 2023-08-31 ENCOUNTER — Ambulatory Visit (HOSPITAL_BASED_OUTPATIENT_CLINIC_OR_DEPARTMENT_OTHER): Admitting: Radiology

## 2023-08-31 DIAGNOSIS — M2550 Pain in unspecified joint: Secondary | ICD-10-CM | POA: Insufficient documentation

## 2023-08-31 DIAGNOSIS — M6281 Muscle weakness (generalized): Secondary | ICD-10-CM | POA: Insufficient documentation

## 2023-08-31 DIAGNOSIS — R202 Paresthesia of skin: Secondary | ICD-10-CM | POA: Diagnosis present

## 2023-08-31 DIAGNOSIS — R200 Anesthesia of skin: Secondary | ICD-10-CM | POA: Insufficient documentation

## 2023-08-31 DIAGNOSIS — G8929 Other chronic pain: Secondary | ICD-10-CM | POA: Diagnosis not present

## 2023-08-31 DIAGNOSIS — M79671 Pain in right foot: Secondary | ICD-10-CM | POA: Diagnosis present

## 2023-08-31 DIAGNOSIS — M79672 Pain in left foot: Secondary | ICD-10-CM | POA: Diagnosis present

## 2023-09-07 ENCOUNTER — Encounter (INDEPENDENT_AMBULATORY_CARE_PROVIDER_SITE_OTHER): Payer: Self-pay | Admitting: Family Medicine

## 2023-09-07 ENCOUNTER — Ambulatory Visit (INDEPENDENT_AMBULATORY_CARE_PROVIDER_SITE_OTHER): Admitting: Family Medicine

## 2023-09-07 VITALS — BP 128/74 | HR 82 | Temp 98.1°F | Ht 61.0 in | Wt 228.0 lb

## 2023-09-07 DIAGNOSIS — F411 Generalized anxiety disorder: Secondary | ICD-10-CM | POA: Diagnosis not present

## 2023-09-07 DIAGNOSIS — E785 Hyperlipidemia, unspecified: Secondary | ICD-10-CM

## 2023-09-07 DIAGNOSIS — E1169 Type 2 diabetes mellitus with other specified complication: Secondary | ICD-10-CM

## 2023-09-07 DIAGNOSIS — Z7985 Long-term (current) use of injectable non-insulin antidiabetic drugs: Secondary | ICD-10-CM

## 2023-09-07 DIAGNOSIS — Z6841 Body Mass Index (BMI) 40.0 and over, adult: Secondary | ICD-10-CM

## 2023-09-07 NOTE — Progress Notes (Signed)
 SUBJECTIVE:  Chief Complaint: Obesity  Interim History: patient is currently taking 3 summer classes and mentions this is a lot for her.  She has been very busy with this. She is getting back to pool therapy and she is getting back in the next few weeks.  She will start going to the one in [place]. She has been doing well following her intake allotment- she voices that she is cooking and prepping food prior to leaving the house to ensure she doesn't eat while out.  She does think she is hitting her protein goal daily.  Hasn't been able to get evaluation for her cyst yet.  Tamara Osborne is here to discuss her progress with her obesity treatment plan. She is on the keeping a food journal and adhering to recommended goals of 1400-1500 calories and 95 grams of protein and states she is following her eating plan approximately 100 % of the time. She states she is not exercising.  OBJECTIVE: Visit Diagnoses: Problem List Items Addressed This Visit       Endocrine   Hyperlipidemia associated with type 2 diabetes mellitus (HCC)   The 10-year ASCVD risk score (Arnett DK, et al., 2019) is: 4.2%   Values used to calculate the score:     Age: 48 years     Clincally relevant sex: Female     Is Non-Hispanic African American: No     Diabetic: Yes     Tobacco smoker: No     Systolic Blood Pressure: 128 mmHg     Is BP treated: Yes     HDL Cholesterol: 41 mg/dL     Total Cholesterol: 213 mg/dL  Patient not on medication at this current time for cholesterol. PCP reached out to patient about cholesterol management.  She was previously on atorvastatin  but had significant leg cramps from that.  We discussed possibly taking rosuvastatin for cholesterol management.  Will follow up on this at next appointment        Other   Generalized anxiety disorder - Primary   Taken off her previous medication but put on Wellbutrin for increase in energy.  We discussed possibility of increased anxiety on this  medication.  She is not noticing this at this time.  Will continue follow for symptom management at this time.      Morbid obesity (HCC)   Other Visit Diagnoses       BMI 40.0-44.9, adult (HCC), Current BMI 43.46           Vitals Temp: 98.1 F (36.7 C) BP: 128/74 Pulse Rate: 82 SpO2: 96 %   Anthropometric Measurements Height: 5' 1 (1.549 m) Weight: 228 lb (103.4 kg) BMI (Calculated): 43.1 Weight at Last Visit: 226 lb Weight Lost Since Last Visit: 0 Weight Gained Since Last Visit: 2 Starting Weight: 208 lb Total Weight Loss (lbs): 0 lb (0 kg)   Body Composition  Body Fat %: 48.4 % Fat Mass (lbs): 110.8 lbs Muscle Mass (lbs): 112 lbs Total Body Water (lbs): 85.8 lbs Visceral Fat Rating : 15   Other Clinical Data Today's Visit #: 63 Starting Date: 09/20/19 Comments: Cat 2     ASSESSMENT AND PLAN:  Diet: Tamara Osborne is currently in the action stage of change. As such, her goal is to continue with weight loss efforts and has agreed to keeping a food journal and adhering to recommended goals of 1400-1500 calories and 95 or more grams protein.  Patient to start food log or journaling meal plan.  The  initial goal will be to habitually log or journal for at least 4 days a week.  The expectation it that patient may not initially meet calorie or protein goals as the nturitional understanding of food intake is begun.  We discussed the 10:1 ratio when reading a food label.  Patient agrees to keep a food log either electronically or on paper and bring to the next appointment to be able to dissect and discuss it with provider.    Exercise:  For substantial health benefits, adults should do at least 150 minutes (2 hours and 30 minutes) a week of moderate-intensity, or 75 minutes (1 hour and 15 minutes) a week of vigorous-intensity aerobic physical activity, or an equivalent combination of moderate- and vigorous-intensity aerobic activity. Aerobic activity should be performed in  episodes of at least 10 minutes, and preferably, it should be spread throughout the week.  Behavior Modification:  We discussed the following Behavioral Modification Strategies today: increasing lean protein intake, decreasing simple carbohydrates, increasing vegetables, meal planning and cooking strategies, and keep a strict food journal.   Return in about 5 weeks (around 10/12/2023).   She was informed of the importance of frequent follow up visits to maximize her success with intensive lifestyle modifications for her multiple health conditions.  Attestation Statements:   Reviewed by clinician on day of visit: allergies, medications, problem list, medical history, surgical history, family history, social history, and previous encounter notes.     Tamara Cho, MD

## 2023-09-07 NOTE — Assessment & Plan Note (Signed)
 The 10-year ASCVD risk score (Arnett DK, et al., 2019) is: 4.2%   Values used to calculate the score:     Age: 48 years     Clincally relevant sex: Female     Is Non-Hispanic African American: No     Diabetic: Yes     Tobacco smoker: No     Systolic Blood Pressure: 128 mmHg     Is BP treated: Yes     HDL Cholesterol: 41 mg/dL     Total Cholesterol: 213 mg/dL  Patient not on medication at this current time for cholesterol. PCP reached out to patient about cholesterol management.  She was previously on atorvastatin  but had significant leg cramps from that.  We discussed possibly taking rosuvastatin for cholesterol management.  Will follow up on this at next appointment

## 2023-09-07 NOTE — Assessment & Plan Note (Signed)
 Taken off her previous medication but put on Wellbutrin for increase in energy.  We discussed possibility of increased anxiety on this medication.  She is not noticing this at this time.  Will continue follow for symptom management at this time.

## 2023-09-16 ENCOUNTER — Ambulatory Visit: Payer: Self-pay | Admitting: *Deleted

## 2023-09-16 ENCOUNTER — Other Ambulatory Visit: Payer: Self-pay

## 2023-09-16 ENCOUNTER — Ambulatory Visit
Admission: RE | Admit: 2023-09-16 | Discharge: 2023-09-16 | Disposition: A | Discharge status: Home / Self Care | Source: Ambulatory Visit | Attending: Family Medicine | Admitting: Family Medicine

## 2023-09-16 VITALS — BP 139/83 | HR 81 | Temp 98.1°F | Resp 20

## 2023-09-16 DIAGNOSIS — L089 Local infection of the skin and subcutaneous tissue, unspecified: Secondary | ICD-10-CM | POA: Diagnosis not present

## 2023-09-16 DIAGNOSIS — T63441A Toxic effect of venom of bees, accidental (unintentional), initial encounter: Secondary | ICD-10-CM

## 2023-09-16 MED ORDER — BACITRACIN 500 UNIT/GM EX OINT
1.0000 | TOPICAL_OINTMENT | Freq: Once | CUTANEOUS | Status: AC
  Administered 2023-09-16: 1 via TOPICAL

## 2023-09-16 MED ORDER — MUPIROCIN 2 % EX OINT: 1.0000 | TOPICAL_OINTMENT | Freq: Two times a day (BID) | CUTANEOUS | 0 refills | Status: AC

## 2023-09-16 MED ORDER — CHLORHEXIDINE GLUCONATE 4 % EX SOLN: Freq: Every day | CUTANEOUS | 0 refills | Status: DC | PRN

## 2023-09-16 NOTE — Telephone Encounter (Signed)
  Reason for Disposition  [1] Red or very tender (to touch) area AND [2] getting larger over 48 hours after the sting  Answer Assessment - Initial Assessment Questions 1. TYPE: What type of sting was it? (e.g., bee, yellow jacket, unknown)      Unsure- nest in ground 2. ONSET: When did it occur?      Over weekend 3. LOCATION: Where is the sting located?  How many stings?     Lower leg- right, unsure- felt like multiple-2 4. SWELLING SIZE: How big is the swelling? (e.g., inches or cm)     Swollen in area- raised 5. REDNESS: Is the area red or pink? If Yes, ask: What size is area of redness? (e.g., inches or cm). When did the redness start?     Redness- lemon size 6. PAIN: Is there any pain? If Yes, ask: How bad is it?  (Scale 0-10; or none, mild, moderate, severe)     Pain has stopped 7. ITCHING: Is there any itching? If Yes, ask: How bad is it?      no 8. RESPIRATORY DISTRESS: Describe your breathing.     no 9. PRIOR REACTIONS: Have you had any severe allergic reactions to stings in the past? If Yes, ask: What happened?     Does not remember 10. OTHER SYMPTOMS: Do you have any other symptoms? (e.g., abdomen pain, face or tongue swelling, new rash elsewhere, vomiting)       no  Protocols used: Bee or Yellow Jacket Sting-A-AH   Copied from CRM [id number]. Topic: Clinical - Red Word Triage >> Sep 16, 2023  8:40 AM Rosaria BRAVO wrote: Red Word that prompted transfer to Nurse Triage: Stung by a bee, swollen and red, pockets of pus.    ----------------------------------------------------------------------- From previous Reason for Contact - Scheduling: Patient/patient representative is calling to schedule an appointment. Refer to attachments for appointment information.

## 2023-09-16 NOTE — ED Provider Notes (Signed)
 [facility]    CSN: [id number] Arrival date & time: 09/16/23  1052      History   Chief Complaint Chief Complaint  Patient presents with   Insect Bite    Bee sting- red, swelling - Entered by patient    HPI Tamara Osborne is a 48 y.o. female.   Patient presenting today with a bee sting to the right ankle that has been present for about a week.  She states that initially the first few days continue to burn as if still being stung but then seemed to get better up until yesterday.  Noticed a small pustule to the area and was concerned that a stinger may still be present so started digging at the area with her fingernail and a credit card.  States now the pustular area is larger and there is some redness in the surrounding area.  So far trying to keep it clean but not applying anything to the area.  Denies fever, chills, numbness, tingling, weakness.  No past history of anaphylactic reactions to stings.    Past Medical History:  Diagnosis Date   Anemia    Angio-edema    Anxiety    Bartholin cyst    Cervical cancer (HCC)    Chronic kidney disease    Constipation    Depression    Deviated septum    Diabetes mellitus without complication (HCC)    Difficult intravenous access    used port for 05-09-2019 surgery   Fibroids    History of blood transfusion    2 units given 04-26-2019, has had total of 8 units since jan 2021   History of blood transfusion 1 unit given 05-30-19   iv fluids also given   History of cervical cancer 07/16/2022   History of kidney problems    History of radiation therapy 03/23/2019-05/04/2019   Cervical external beam   Dr Lynwood Nasuti   History of radiation therapy 05/09/2019-06/05/2019   vaginal brachytherapy   Dr Lynwood Nasuti   History of recent blood transfusion 05/16/2019   2 units given  per dr melrose [facility]   Hypertension    Neuropathy    both thumbs   Obesity    Palpitations    PONV (postoperative nausea and vomiting)     Prediabetes    Preeclampsia 2006   Sleep apnea    no cpap used insurance would not cover, osa severe per pt   SOB (shortness of breath)    Squamous cell carcinoma of cervix (HCC) 03/23/2019   Swallowing difficulty    Symptomatic skin lesion 07/14/2022   Vitamin D  deficiency 01/10/2020    Patient Active Problem List   Diagnosis Date Noted   Hepatic steatosis 08/15/2023   Morbid obesity (HCC) 07/10/2023   History of radiation therapy 05/21/2023   Difficult intravenous access 05/21/2023   HSV-2 infection 07/13/2022   Dyspareunia in female 10/23/2021   Hyperlipidemia associated with type 2 diabetes mellitus (HCC) 07/03/2021   Type 2 diabetes mellitus with stage 3b chronic kidney disease, without long-term current use of insulin  (HCC) 11/15/2020   OSA (obstructive sleep apnea) 07/31/2020   Other hyperlipidemia 01/10/2020   Insulin  resistance 01/10/2020   Class 2 severe obesity with serious comorbidity and body mass index (BMI) of 39.0 to 39.9 in adult (HCC) 01/10/2020   Peripheral 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) 04/25/2019   CKD (chronic kidney disease), symptom management only, stage 3 (moderate) (HCC) 03/30/2019   Generalized anxiety disorder 03/30/2019  Hypertension associated with type 2 diabetes mellitus (HCC)    Squamous cell carcinoma of cervix (HCC) 03/23/2019   Deficiency anemia 03/22/2019    Past Surgical History:  Procedure Laterality Date   CESAREAN SECTION WITH BILATERAL TUBAL LIGATION  11/10/2004       DILATION AND CURETTAGE OF UTERUS  1997   IR IMAGING GUIDED PORT INSERTION  04/04/2019   IR REMOVAL TUN ACCESS W/ PORT W/O FL MOD SED  10/27/2021   OPERATIVE ULTRASOUND N/A 05/09/2019   Procedure: OPERATIVE ULTRASOUND;  Surgeon: Shannon Agent, MD;  Location: [facility];  Service: Urology;  Laterality: N/A;   OPERATIVE ULTRASOUND N/A 05/15/2019   Procedure: OPERATIVE ULTRASOUND;  Surgeon: Shannon Agent, MD;  Location: [facility];   Service: Urology;  Laterality: N/A;   OPERATIVE ULTRASOUND N/A 05/22/2019   Procedure: OPERATIVE ULTRASOUND;  Surgeon: Shannon Agent, MD;  Location: [facility];  Service: Urology;  Laterality: N/A;   OPERATIVE ULTRASOUND N/A 05/29/2019   Procedure: OPERATIVE ULTRASOUND;  Surgeon: Shannon Agent, MD;  Location: [facility];  Service: Urology;  Laterality: N/A;   OPERATIVE ULTRASOUND N/A 06/05/2019   Procedure: OPERATIVE ULTRASOUND;  Surgeon: Shannon Agent, MD;  Location: [facility];  Service: Urology;  Laterality: N/A;   TANDEM RING INSERTION N/A 05/09/2019   Procedure: TANDEM RING INSERTION;  Surgeon: Shannon Agent, MD;  Location: [facility];  Service: Urology;  Laterality: N/A;   TANDEM RING INSERTION N/A 05/15/2019   Procedure: TANDEM RING INSERTION;  Surgeon: Shannon Agent, MD;  Location: [facility];  Service: Urology;  Laterality: N/A;   TANDEM RING INSERTION N/A 05/22/2019   Procedure: TANDEM RING INSERTION;  Surgeon: Shannon Agent, MD;  Location: [facility];  Service: Urology;  Laterality: N/A;   TANDEM RING INSERTION N/A 05/29/2019   Procedure: TANDEM RING INSERTION;  Surgeon: Shannon Agent, MD;  Location: [facility];  Service: Urology;  Laterality: N/A;   TANDEM RING INSERTION N/A 06/05/2019   Procedure: TANDEM RING INSERTION;  Surgeon: Shannon Agent, MD;  Location: [facility];  Service: Urology;  Laterality: N/A;    OB History     Gravida  6   Para  4   Term  3   Preterm  1   AB  2   Living  3      SAB  2   IAB      Ectopic      Multiple      Live Births  3            Home Medications    Prior to Admission medications   Medication Sig Start Date End Date Taking? Authorizing Provider  chlorhexidine  (HIBICLENS ) 4 % external liquid Apply topically daily as needed. 09/16/23  Yes Stuart Vernell Norris, PA-C  mupirocin  ointment (BACTROBAN ) 2 %  Apply 1 Application topically 2 (two) times daily. 09/16/23  Yes Stuart Vernell Norris, PA-C  Accu-Chek Softclix Lancets lancets Use to check blood sugar TID. 11/14/20   Newlin, Enobong, MD  amLODipine -valsartan  (EXFORGE ) 10-320 MG tablet Take 1 tablet by mouth daily. 04/23/23   Caudle, Thersia Bitters, FNP  azelastine  (ASTELIN ) 0.1 % nasal spray Place 1 spray into both nostrils 2 (two) times daily as needed. Use in each nostril as directed 09/21/22   Tobie Arleta SQUIBB, MD  Blood Glucose Monitoring Suppl (ACCU-CHEK GUIDE) w/Device KIT Use to check blood sugar TID. 11/14/20   Newlin, Enobong, MD  buPROPion (WELLBUTRIN XL) 150 MG 24 hr tablet Take 150 mg by mouth daily. 06/28/23   [provider]  Cholecalciferol 50 MCG (2000 UT) TABS Take by mouth. 03/06/21   [provider]  conjugated estrogens  (PREMARIN ) vaginal cream Place 1 Applicatorful vaginally every other day. 07/21/23   McKenzie, Belvie CROME, MD  EPINEPHrine  (EPIPEN  2-PAK) 0.3 mg/0.3 mL IJ SOAJ injection Inject 0.3 mg into the muscle as needed for anaphylaxis. 09/21/22   Tobie Arleta SQUIBB, MD  fluticasone  (FLONASE ) 50 MCG/ACT nasal spray Place 2 sprays into both nostrils daily. 09/21/22   Tobie Arleta SQUIBB, MD  glucose blood (ACCU-CHEK GUIDE) test strip Use to check blood sugar TID. 11/14/20   Newlin, Enobong, MD  ipratropium (ATROVENT ) 0.06 % nasal spray Place 2 sprays into both nostrils 3 (three) times daily as needed for rhinitis (drainage). 09/21/22   Tobie Arleta SQUIBB, MD  levocetirizine (XYZAL ) 5 MG tablet Take 1 tablet (5 mg total) by mouth every evening. Patient taking differently: Take 5 mg by mouth as needed for allergies. 09/21/22   Tobie Arleta SQUIBB, MD  RESTASIS 0.05 % ophthalmic emulsion 1 drop 2 (two) times daily. 08/07/22   [provider]  Semaglutide , 1 MG/DOSE, 4 MG/3ML SOPN Inject 1 mg as directed once a week. 08/05/23   Berkeley Adelita PENNER, MD    Family History Family History  Problem Relation Age of Onset   Brain cancer  Mother        lung cancer to brain   Hypertension Mother    Cancer Mother    Depression Mother    Anxiety disorder Mother    Diabetes Father    Kidney disease Father    Cancer Father        kidney cancer   Heart failure Sister    Heart attack Sister    Heart failure Paternal Aunt    Cancer Maternal Grandmother    Kidney failure Maternal Grandfather     Social History Social History   Tobacco Use   Smoking status: Never   Smokeless tobacco: Never  Vaping Use   Vaping status: Never Used  Substance Use Topics   Alcohol use: No   Drug use: No     Allergies   Penicillins, Shellfish allergy , and Sulfa antibiotics   Review of Systems Review of Systems Per HPI  Physical Exam Triage Vital Signs ED Triage Vitals  Encounter Vitals Group     BP 09/16/23 1120 139/83     Girls Systolic BP Percentile --      Girls Diastolic BP Percentile --      Boys Systolic BP Percentile --      Boys Diastolic BP Percentile --      Pulse Rate 09/16/23 1120 81     Resp 09/16/23 1120 20     Temp 09/16/23 1120 98.1 F (36.7 C)     Temp Source 09/16/23 1120 Oral     SpO2 09/16/23 1120 95 %     Weight --      Height --      Head Circumference --      Peak Flow --      Pain Score 09/16/23 1117 0     Pain Loc --      Pain Education --      Exclude from Growth Chart --    No data found.  Updated Vital Signs BP 139/83 (BP Location: Right Arm)   Pulse 81   Temp 98.1 F (36.7 C) (Oral)  Resp 20   LMP  (LMP Unknown)   SpO2 95%   Visual Acuity Right Eye Distance:   Left Eye Distance:   Bilateral Distance:    Right Eye Near:   Left Eye Near:    Bilateral Near:     Physical Exam Vitals and nursing note reviewed.  Constitutional:      Appearance: Normal appearance. She is not ill-appearing.  HENT:     Head: Atraumatic.     Mouth/Throat:     Mouth: Mucous membranes are moist.     Pharynx: Oropharynx is clear. No posterior oropharyngeal erythema.  Eyes:     Extraocular  Movements: Extraocular movements intact.     Conjunctiva/sclera: Conjunctivae normal.  Cardiovascular:     Rate and Rhythm: Normal rate.  Pulmonary:     Effort: Pulmonary effort is normal.     Breath sounds: Normal breath sounds. No wheezing or rales.  Musculoskeletal:        General: Normal range of motion.     Cervical back: Normal range of motion and neck supple.  Skin:    General: Skin is warm and dry.     Findings: Erythema present.     Comments: Small pustular lesion to the right ankle with some mild surrounding erythema.  Nonfluctuant, no induration, no active bleeding or drainage.  Neurological:     Mental Status: She is alert and oriented to person, place, and time.  Psychiatric:        Mood and Affect: Mood normal.        Thought Content: Thought content normal.        Judgment: Judgment normal.      UC Treatments / Results  Labs (all labs ordered are listed, but only abnormal results are displayed) Labs Reviewed - No data to display  EKG   Radiology No results found.  Procedures Procedures (including critical care time)  Medications Ordered in UC Medications  bacitracin  ointment 1 Application (1 Application Topical Given 09/16/23 1145)    Initial Impression / Assessment and Plan / UC Course  I have reviewed the triage vital signs and the nursing notes.  Pertinent labs & imaging results that were available during my care of the patient were reviewed by me and considered in my medical decision making (see chart for details).     Antihistamines until symptoms fully resolved from bee sting.  Discussed importance of good topical wound care with Hibiclens , Bactroban , nonstick dressings until fully healed and avoidance of picking or scratching at the area.  Leg elevation to help with swelling at rest.  Return for worsening symptoms.  No evidence of anaphylactic reaction at this time.  Final Clinical Impressions(s) / UC Diagnoses   Final diagnoses:  Pustule   Bee sting, accidental or unintentional, initial encounter     Discharge Instructions      Clean the area at least once a day with Hibiclens  and apply the mupirocin  ointment and a nonstick dressing.  Elevate the leg at rest to help with swelling.  Do not scratch or did get the area as this will make it worse.  You may also take an antihistamine such as Zyrtec daily for any lingering allergic localized response from the sting    ED Prescriptions     Medication Sig Dispense Auth. Provider   chlorhexidine  (HIBICLENS ) 4 % external liquid Apply topically daily as needed. 236 mL Stuart Vernell Norris, PA-C   mupirocin  ointment (BACTROBAN ) 2 % Apply 1 Application topically 2 (two)  times daily. 60 g Stuart Vernell Norris, [place]      PDMP not reviewed this encounter.   Stuart Vernell Norris, [place] 09/16/23 1750

## 2023-09-16 NOTE — ED Triage Notes (Signed)
 Pt reports was stung by a bee over the weekend on right ankle. Pt reports pain has subsided but reports is now itching and has intermittent drainage. Has attempted to remove possible stinger, unsure of what type of bee it was.

## 2023-09-16 NOTE — Discharge Instructions (Signed)
 Clean the area at least once a day with Hibiclens  and apply the mupirocin  ointment and a nonstick dressing.  Elevate the leg at rest to help with swelling.  Do not scratch or did get the area as this will make it worse.  You may also take an antihistamine such as Zyrtec daily for any lingering allergic localized response from the sting

## 2023-09-16 NOTE — Telephone Encounter (Signed)
 FYI Only or Action Required?: FYI only for provider.  Patient was last seen in primary care on 09/07/2023 by [place] Adelita PENNER, MD.  Called Nurse Triage reporting Insect Bite.  Symptoms began several days ago.  Interventions attempted: Nothing.  Symptoms are: gradually worsening.  Triage Disposition: See Physician Within 24 Hours  Patient/caregiver understands and will follow disposition?: Yes

## 2023-09-20 ENCOUNTER — Encounter: Payer: Self-pay | Admitting: *Deleted

## 2023-09-21 ENCOUNTER — Ambulatory Visit (HOSPITAL_BASED_OUTPATIENT_CLINIC_OR_DEPARTMENT_OTHER): Admitting: Cardiovascular Disease

## 2023-09-21 ENCOUNTER — Encounter (HOSPITAL_BASED_OUTPATIENT_CLINIC_OR_DEPARTMENT_OTHER): Payer: Self-pay | Admitting: Cardiovascular Disease

## 2023-09-21 ENCOUNTER — Encounter (HOSPITAL_BASED_OUTPATIENT_CLINIC_OR_DEPARTMENT_OTHER): Payer: Self-pay

## 2023-09-21 VITALS — BP 134/78 | HR 99 | Ht 61.0 in | Wt 231.0 lb

## 2023-09-21 DIAGNOSIS — G4733 Obstructive sleep apnea (adult) (pediatric): Secondary | ICD-10-CM | POA: Diagnosis not present

## 2023-09-21 DIAGNOSIS — E1169 Type 2 diabetes mellitus with other specified complication: Secondary | ICD-10-CM

## 2023-09-21 DIAGNOSIS — E785 Hyperlipidemia, unspecified: Secondary | ICD-10-CM

## 2023-09-21 DIAGNOSIS — I152 Hypertension secondary to endocrine disorders: Secondary | ICD-10-CM | POA: Diagnosis not present

## 2023-09-21 DIAGNOSIS — N183 Chronic kidney disease, stage 3 unspecified: Secondary | ICD-10-CM

## 2023-09-21 DIAGNOSIS — E1159 Type 2 diabetes mellitus with other circulatory complications: Secondary | ICD-10-CM | POA: Diagnosis not present

## 2023-09-21 NOTE — Progress Notes (Signed)
 [facility]ension [facility] Follow up:    Date:  09/21/2023   ID:  Tamara Osborne, DOB 06-24-1975, MRN [id number]  PCP:  Knute Thersia Bitters, FNP  Cardiologist:  None   Referring MD: Knute Thersia Bitters, *   CC: Hypertension  History of Present Illness:    Tamara Osborne is a 48 y.o. female with a hx of hypertension, preeclampsia, hyperlipidemia, OSA, diabetes, squamous cell carcinoma of cervix s/p radiation and chemotherapy, morbid obesity, here for follow up.  She first established care in the [facility].  She will struggle to control her blood pressure.  She is referred by her PCP, Tawni Arts, FNP.  She was seen 08/06/2022 and blood pressure was 154/92 on amlodipine , valsartan , and chlorthalidone .  She was referred to the Advanced Hypertension clinic and nephrology. She has been working with the healthy weight and wellness clinic. Prior echo in 2021 revealed LVEF 60-65% with moderate LVH and grade 1 diastolic dysfunction.    Tamara Osborne struggled with hypertension since her 46s.  She had preeclampsia with pregnancy.  At her initial visit home blood pressures were averaging in the 120s over 70s.  She had clonidine  to take for emergencies.  She also did report whitecoat hypertension.  Blood pressure in the office was 154/86.  Chlorthalidone  was resumed.  Renal artery Dopplers were negative.  Her statin was discontinued due to myalgias.  She is with neuropathy from chemotherapy and was referred for water physical therapy.  She called the office reporting low blood pressures 12/2. We recommended reducing chlorthalidone  to 12.5mg .   Discussed the use of AI scribe software for clinical note transcription with the patient, who gave verbal consent to proceed.  History of Present Illness Tamara Osborne has a history of hypertension, currently well-controlled with amlodipine /valsartan . Blood pressure readings are stable, around 130/78 mmHg in the morning and 128/78 mmHg  in the afternoon.  She experiences leg numbness and is preparing to restart physical therapy. She has noticed hematuria and was referred to a urologist, who attributed it to damage from previous cancer treatments. A scan revealed a pancreatic cyst, and she is scheduled for a consultation with a gastroenterologist in early August.  She struggles with weight management, previously finding it difficult to lose weight while on anxiety medication. After switching to Wellbutrin, she lost four pounds. She is actively managing her diet and following a meal plan provided by her weight management team.  She has a history of UTIs and was prescribed estrogen cream to address symptoms related to menopause and previous cancer surgeries. She reports improvement in symptoms such as dryness and pinching since starting the cream.  She has a family history of cancer and heart disease, with both parents having died of cancer and a sister who had a massive heart attack and is currently battling cancer. She is scheduled for genetic testing on August 1st.  She has stage 3 kidney disease, which she reports is stable. She also mentions a small hernia and fatty liver disease, which were identified during a scan.  She is pursuing further education and is currently enrolled in a degree program, which involves significant computer use. She notes the development of a small knot on her hand, which she associates with increased computer use.  Previous antihypertensives: Multiple intolerances per her report Lisinopril - skin crawling sensations  Past Medical History:  Diagnosis Date   Anemia    Angio-edema    Anxiety    Bartholin cyst    Cervical  cancer (HCC)    Chronic kidney disease    Constipation    Depression    Deviated septum    Diabetes mellitus without complication (HCC)    Difficult intravenous access    used port for 05-09-2019 surgery   Fibroids    History of blood transfusion    2 units given 04-26-2019,  has had total of 8 units since jan 2021   History of blood transfusion 1 unit given 05-30-19   iv fluids also given   History of cervical cancer 07/16/2022   History of kidney problems    History of radiation therapy 03/23/2019-05/04/2019   Cervical external beam   Dr Lynwood Nasuti   History of radiation therapy 05/09/2019-06/05/2019   vaginal brachytherapy   Dr Lynwood Nasuti   History of recent blood transfusion 05/16/2019   2 units given  per dr melrose at cancer center   Hypertension    Neuropathy    both thumbs   Obesity    Palpitations    PONV (postoperative nausea and vomiting)    Prediabetes    Preeclampsia 2006   Sleep apnea    no cpap used insurance would not cover, osa severe per pt   SOB (shortness of breath)    Squamous cell carcinoma of cervix (HCC) 03/23/2019   Swallowing difficulty    Symptomatic skin lesion 07/14/2022   Vitamin D  deficiency 01/10/2020    Past Surgical History:  Procedure Laterality Date   CESAREAN SECTION WITH BILATERAL TUBAL LIGATION  11/10/2004       DILATION AND CURETTAGE OF UTERUS  1997   IR IMAGING GUIDED PORT INSERTION  04/04/2019   IR REMOVAL TUN ACCESS W/ PORT W/O FL MOD SED  10/27/2021   OPERATIVE ULTRASOUND N/A 05/09/2019   Procedure: OPERATIVE ULTRASOUND;  Surgeon: Nasuti Lynwood, MD;  Location: [facility];  Service: Urology;  Laterality: N/A;   OPERATIVE ULTRASOUND N/A 05/15/2019   Procedure: OPERATIVE ULTRASOUND;  Surgeon: Nasuti Lynwood, MD;  Location: [facility];  Service: Urology;  Laterality: N/A;   OPERATIVE ULTRASOUND N/A 05/22/2019   Procedure: OPERATIVE ULTRASOUND;  Surgeon: Nasuti Lynwood, MD;  Location: [facility];  Service: Urology;  Laterality: N/A;   OPERATIVE ULTRASOUND N/A 05/29/2019   Procedure: OPERATIVE ULTRASOUND;  Surgeon: Nasuti Lynwood, MD;  Location: [facility];  Service: Urology;  Laterality: N/A;   OPERATIVE ULTRASOUND N/A 06/05/2019   Procedure: OPERATIVE  ULTRASOUND;  Surgeon: Nasuti Lynwood, MD;  Location: [facility];  Service: Urology;  Laterality: N/A;   TANDEM RING INSERTION N/A 05/09/2019   Procedure: TANDEM RING INSERTION;  Surgeon: Nasuti Lynwood, MD;  Location: [facility];  Service: Urology;  Laterality: N/A;   TANDEM RING INSERTION N/A 05/15/2019   Procedure: TANDEM RING INSERTION;  Surgeon: Nasuti Lynwood, MD;  Location: [facility];  Service: Urology;  Laterality: N/A;   TANDEM RING INSERTION N/A 05/22/2019   Procedure: TANDEM RING INSERTION;  Surgeon: Nasuti Lynwood, MD;  Location: [facility];  Service: Urology;  Laterality: N/A;   TANDEM RING INSERTION N/A 05/29/2019   Procedure: TANDEM RING INSERTION;  Surgeon: Nasuti Lynwood, MD;  Location: [facility];  Service: Urology;  Laterality: N/A;   TANDEM RING INSERTION N/A 06/05/2019   Procedure: TANDEM RING INSERTION;  Surgeon: Nasuti Lynwood, MD;  Location: [facility];  Service: Urology;  Laterality: N/A;    Current Medications: Current Meds  Medication Sig   Accu-Chek  Softclix Lancets lancets Use to check blood sugar TID.   amLODipine -valsartan  (EXFORGE ) 10-320 MG tablet Take 1 tablet by mouth daily.   azelastine  (ASTELIN ) 0.1 % nasal spray Place 1 spray into both nostrils 2 (two) times daily as needed. Use in each nostril as directed   Blood Glucose Monitoring Suppl (ACCU-CHEK GUIDE) w/Device KIT Use to check blood sugar TID.   buPROPion (WELLBUTRIN XL) 150 MG 24 hr tablet Take 150 mg by mouth daily.   chlorhexidine  (HIBICLENS ) 4 % external liquid Apply topically daily as needed.   Cholecalciferol 50 MCG (2000 UT) TABS Take by mouth.   conjugated estrogens  (PREMARIN ) vaginal cream Place 1 Applicatorful vaginally every other day.   EPINEPHrine  (EPIPEN  2-PAK) 0.3 mg/0.3 mL IJ SOAJ injection Inject 0.3 mg into the muscle as needed for anaphylaxis.   fluticasone  (FLONASE ) 50 MCG/ACT nasal spray Place 2  sprays into both nostrils daily.   glucose blood (ACCU-CHEK GUIDE) test strip Use to check blood sugar TID.   ipratropium (ATROVENT ) 0.06 % nasal spray Place 2 sprays into both nostrils 3 (three) times daily as needed for rhinitis (drainage).   levocetirizine (XYZAL ) 5 MG tablet Take 1 tablet (5 mg total) by mouth every evening.   mupirocin  ointment (BACTROBAN ) 2 % Apply 1 Application topically 2 (two) times daily.   RESTASIS 0.05 % ophthalmic emulsion 1 drop 2 (two) times daily.   Semaglutide , 1 MG/DOSE, 4 MG/3ML SOPN Inject 1 mg as directed once a week.     Allergies:   Bee venom, Penicillins, Shellfish allergy , and Sulfa antibiotics   Social History   Socioeconomic History   Marital status: Single    Spouse name: Not on file   Number of children: 4   Years of education: Not on file   Highest education level: Not on file  Occupational History   Not on file  Tobacco Use   Smoking status: Never   Smokeless tobacco: Never  Vaping Use   Vaping status: Never Used  Substance and Sexual Activity   Alcohol use: No   Drug use: No   Sexual activity: Not Currently    Birth control/protection: Surgical  Other Topics Concern   Not on file  Social History Narrative   Lives with her boyfriend   Social Drivers of Health   Financial Resource Strain: High Risk (04/23/2023)   Overall Financial Resource Strain (CARDIA)    Difficulty of Paying Living Expenses: Very hard  Food Insecurity: No Food Insecurity (10/08/2022)   Hunger Vital Sign    Worried About Running Out of Food in the Last Year: Never true    Ran Out of Food in the Last Year: Never true  Transportation Needs: No Transportation Needs (10/08/2022)   PRAPARE - Administrator, Civil Service (Medical): No    Lack of Transportation (Non-Medical): No  Physical Activity: Insufficiently Active (10/08/2022)   Exercise Vital Sign    Days of Exercise per Week: 2 days    Minutes of Exercise per Session: 40 min  Stress: No  Stress Concern Present (04/23/2023)   [employer] of Occupational Health - Occupational Stress Questionnaire    Feeling of Stress : Not at all  Social Connections: Socially Isolated (04/23/2023)   Social Connection and Isolation Panel    Frequency of Communication with Friends and Family: More than three times a week    Frequency of Social Gatherings with Friends and Family: Once a week    Attends Religious Services: Never    Active  Member of Clubs or Organizations: No    Attends Engineer, structural: Never    Marital Status: Never married     Family History: The patient's family history includes Anxiety disorder in her mother; Brain cancer in her mother; Cancer in her father, maternal grandmother, and mother; Depression in her mother; Diabetes in her father; Heart attack in her sister; Heart failure in her paternal aunt and sister; Hypertension in her mother; Kidney disease in her father; Kidney failure in her maternal grandfather.  The 10-year ASCVD risk score (Arnett DK, et al., 2019) is: 4.5%   Values used to calculate the score:     Age: 38 years     Clincally relevant sex: Female     Is Non-Hispanic African American: No     Diabetic: Yes     Tobacco smoker: No     Systolic Blood Pressure: 134 mmHg     Is BP treated: Yes     HDL Cholesterol: 41 mg/dL     Total Cholesterol: 213 mg/dL  ROS:   Please see the history of present illness.    (+) RLE numbness All other systems reviewed and are negative.  EKGs/Labs/Other Studies Reviewed:    Echocardiogram  10/25/2019: Sonographer Comments: Per order - Did not administer Definity.    1. Left ventricular ejection fraction, by estimation, is 60 to 65%. The  left ventricle has normal function. The left ventricle has no regional  wall motion abnormalities. There is moderate left ventricular hypertrophy.  Left ventricular diastolic  parameters are consistent with Grade I diastolic dysfunction (impaired  relaxation).    2. Right ventricular systolic function is normal. The right ventricular  size is normal.   3. The mitral valve is normal in structure. No evidence of mitral valve  regurgitation. No evidence of mitral stenosis.   4. The aortic valve is normal in structure. Aortic valve regurgitation is  not visualized. No aortic stenosis is present.   5. The inferior vena cava is normal in size with greater than 50%  respiratory variability, suggesting right atrial pressure of 3 mmHg.   EKG:  EKG is personally reviewed. 10/08/2022:  Sinus tachycardia. Rate 101 bpm.  LVH with secondary repolarization abnormality.   Recent Labs: 06/09/2023: BUN 23; Creatinine 1.4; Potassium 4.8; Sodium 140   Recent Lipid Panel    Component Value Date/Time   CHOL 213 (H) 08/04/2023 0936   TRIG 125 08/04/2023 0936   HDL 41 08/04/2023 0936   CHOLHDL 5.2 (H) 08/04/2023 0936   CHOLHDL 6.0 06/05/2021 1132   VLDL 26 06/05/2021 1132   LDLCALC 149 (H) 08/04/2023 0936    Physical Exam:    VS:  BP 134/78   Pulse 99   Ht 5' 1 (1.549 m)   Wt 231 lb (104.8 kg)   LMP  (LMP Unknown)   SpO2 96%   BMI 43.65 kg/m  , BMI Body mass index is 43.65 kg/m. GENERAL:  Well appearing HEENT: Pupils equal round and reactive, fundi not visualized, oral mucosa unremarkable NECK:  No jugular venous distention, waveform within normal limits, carotid upstroke brisk and symmetric, no bruits, no thyromegaly LUNGS:  Clear to auscultation bilaterally HEART:  RRR.  PMI not displaced or sustained,S1 and S2 within normal limits, no S3, no S4, no clicks, no rubs, no murmurs ABD:  Flat, positive bowel sounds normal in frequency in pitch, no bruits, no rebound, no guarding, no midline pulsatile mass, no hepatomegaly, no splenomegaly EXT:  2 plus pulses throughout,  no edema, no cyanosis no clubbing SKIN:  No rashes no nodules NEURO:  Cranial nerves II through XII grossly intact, motor grossly intact throughout PSYCH:  Cognitively intact, oriented to  person place and time   ASSESSMENT/PLAN:    Assessment & Plan # Hypertension Well-controlled with amlodipine  and valsartan . Blood pressure consistently 120s-130s/70s-80s mmHg. - Continue current therapy  # Hyperlipidemia Slightly elevated cholesterol. ASCVD 10 year risk 4.5%. Sh has a family history of premature CAD.  - Order calcium  score. - Continue dietary and exercise modifications.  # Chronic kidney disease, stage 3 Well-managed with no recent exacerbations or changes in renal function.  # Pancreatic cyst No significant weight loss, reassuring against malignancy. Awaiting gastroenterology evaluation in August.  # Hepatic steatosis: Continue working on diet and exercise.  Continue semaglutide .   # Menopausal symptoms Managed with topical estrogen cream. Symptoms improved. Cancer risk concerns addressed by oncologist. - Continue topical estrogen cream.  # Depression Switched to Wellbutrin, resulting in weight loss and improved energy. - Continue Wellbutrin.   Screening for Secondary Hypertension:     10/08/2022   11:36 AM  Causes  Drugs/Herbals Screened     - Comments Limits sodium.  No EtOH.  Occasional coffee.  Renovascular HTN Screened     - Comments Check renal artery Dopplers  Sleep Apnea Screened     - Comments Uses CPAP  Thyroid Disease Screened  Hyperaldosteronism Screened     - Comments Check renin and aldosterone  Pheochromocytoma N/A  Cushing's Syndrome Screened     - Comments Check AM cortisol  Hyperparathyroidism Screened  Coarctation of the Aorta Screened     - Comments BP symmetric  Compliance Screened     - Comments out of chlorthalidone  for a month    Relevant Labs/Studies:    Latest Ref Rng & Units 06/09/2023   12:00 AM 12/09/2022   12:03 PM 08/06/2022   11:58 AM  Basic Labs  Sodium 137 - 147 140     140     140   Potassium 3.5 - 5.1 mEq/L 4.8     4.3     4.9   Creatinine 0.5 - 1.1 1.4     1.6     1.50      This result is from an  external source.       Latest Ref Rng & Units 08/06/2022   11:58 AM 09/20/2019   11:06 AM  Thyroid   TSH 0.450 - 4.500 uIU/mL 1.850  1.910      Medication Adjustments/Labs and Tests Ordered: Current medicines are reviewed at length with the patient today.  Concerns regarding medicines are outlined above.   Orders Placed This Encounter  Procedures   CT CARDIAC SCORING (SELF PAY ONLY)   No orders of the defined types were placed in this encounter.    Signed, Annabella Scarce, MD  09/21/2023 4:10 PM    [facility]

## 2023-09-21 NOTE — Patient Instructions (Signed)
 Medication Instructions:  Your physician recommends that you continue on your current medications as directed. Please refer to the Current Medication list given to you today.  Labwork: NONE  Testing/Procedures: CALCIUM  SCORE - THIS WILL COST YOU $99 OUT OF POCKET   Follow-Up: 6 MONTHS WITH DR [place], CAITLIN W NP, OR MICHELLE S NP   If you need a refill on your cardiac medications before your next appointment, please call your pharmacy.

## 2023-09-30 ENCOUNTER — Encounter (HOSPITAL_BASED_OUTPATIENT_CLINIC_OR_DEPARTMENT_OTHER): Payer: Self-pay | Admitting: Physical Therapy

## 2023-09-30 ENCOUNTER — Ambulatory Visit (HOSPITAL_BASED_OUTPATIENT_CLINIC_OR_DEPARTMENT_OTHER): Admitting: Physical Therapy

## 2023-09-30 DIAGNOSIS — M6281 Muscle weakness (generalized): Secondary | ICD-10-CM

## 2023-09-30 DIAGNOSIS — R200 Anesthesia of skin: Secondary | ICD-10-CM

## 2023-09-30 DIAGNOSIS — M79671 Pain in right foot: Secondary | ICD-10-CM | POA: Diagnosis not present

## 2023-09-30 NOTE — Therapy (Signed)
 OUTPATIENT PHYSICAL THERAPY THORACOLUMBAR EVALUATION   Patient Name: Tamara Osborne MRN: [id number] DOB:Sep 24, 1975, 48 y.o., female Today's Date: 09/30/2023  END OF SESSION:  PT End of Session - 09/30/23 1059     Visit Number 2    Date for PT Re-Evaluation 11/12/23    Authorization Type [employer] med[employer]    Authorization Time Period 7/31 - 9/28    Authorization - Visit Number 1    Authorization - Number of Visits 8    PT Start Time 1101    PT Stop Time 1140    PT Time Calculation (min) 39 min    Activity Tolerance Patient tolerated treatment well    Behavior During Therapy WFL for tasks assessed/performed          Past Medical History:  Diagnosis Date   Anemia    Angio-edema    Anxiety    Bartholin cyst    Cervical cancer (HCC)    Chronic kidney disease    Constipation    Depression    Deviated septum    Diabetes mellitus without complication (HCC)    Difficult intravenous access    used port for 05-09-2019 surgery   Fibroids    History of blood transfusion    2 units given 04-26-2019, has had total of 8 units since jan 2021   History of blood transfusion 1 unit given 05-30-19   iv fluids also given   History of cervical cancer 07/16/2022   History of kidney problems    History of radiation therapy 03/23/2019-05/04/2019   Cervical external beam   Dr Lynwood Nasuti   History of radiation therapy 05/09/2019-06/05/2019   vaginal brachytherapy   Dr Lynwood Nasuti   History of recent blood transfusion 05/16/2019   2 units given  per dr melrose at cancer center   Hypertension    Neuropathy    both thumbs   Obesity    Palpitations    PONV (postoperative nausea and vomiting)    Prediabetes    Preeclampsia 2006   Sleep apnea    no cpap used insurance would not cover, osa severe per pt   SOB (shortness of breath)    Squamous cell carcinoma of cervix (HCC) 03/23/2019   Swallowing difficulty    Symptomatic skin lesion 07/14/2022   Vitamin D  deficiency 01/10/2020    Past Surgical History:  Procedure Laterality Date   CESAREAN SECTION WITH BILATERAL TUBAL LIGATION  11/10/2004       DILATION AND CURETTAGE OF UTERUS  1997   IR IMAGING GUIDED PORT INSERTION  04/04/2019   IR REMOVAL TUN ACCESS W/ PORT W/O FL MOD SED  10/27/2021   OPERATIVE ULTRASOUND N/A 05/09/2019   Procedure: OPERATIVE ULTRASOUND;  Surgeon: Nasuti Lynwood, MD;  Location: [facility];  Service: Urology;  Laterality: N/A;   OPERATIVE ULTRASOUND N/A 05/15/2019   Procedure: OPERATIVE ULTRASOUND;  Surgeon: Nasuti Lynwood, MD;  Location: [facility];  Service: Urology;  Laterality: N/A;   OPERATIVE ULTRASOUND N/A 05/22/2019   Procedure: OPERATIVE ULTRASOUND;  Surgeon: Nasuti Lynwood, MD;  Location: [facility];  Service: Urology;  Laterality: N/A;   OPERATIVE ULTRASOUND N/A 05/29/2019   Procedure: OPERATIVE ULTRASOUND;  Surgeon: Nasuti Lynwood, MD;  Location: [facility];  Service: Urology;  Laterality: N/A;   OPERATIVE ULTRASOUND N/A 06/05/2019   Procedure: OPERATIVE ULTRASOUND;  Surgeon: Nasuti Lynwood, MD;  Location: [facility];  Service: Urology;  Laterality: N/A;   TANDEM RING INSERTION N/A  05/09/2019   Procedure: TANDEM RING INSERTION;  Surgeon: Shannon Agent, MD;  Location: [facility];  Service: Urology;  Laterality: N/A;   TANDEM RING INSERTION N/A 05/15/2019   Procedure: TANDEM RING INSERTION;  Surgeon: Shannon Agent, MD;  Location: [facility];  Service: Urology;  Laterality: N/A;   TANDEM RING INSERTION N/A 05/22/2019   Procedure: TANDEM RING INSERTION;  Surgeon: Shannon Agent, MD;  Location: [facility];  Service: Urology;  Laterality: N/A;   TANDEM RING INSERTION N/A 05/29/2019   Procedure: TANDEM RING INSERTION;  Surgeon: Shannon Agent, MD;  Location: [facility];  Service: Urology;  Laterality: N/A;   TANDEM RING INSERTION N/A 06/05/2019   Procedure: TANDEM RING  INSERTION;  Surgeon: Shannon Agent, MD;  Location: [facility];  Service: Urology;  Laterality: N/A;   Patient Active Problem List   Diagnosis Date Noted   Hepatic steatosis 08/15/2023   Morbid obesity (HCC) 07/10/2023   History of radiation therapy 05/21/2023   Difficult intravenous access 05/21/2023   HSV-2 infection 07/13/2022   Dyspareunia in female 10/23/2021   Hyperlipidemia associated with type 2 diabetes mellitus (HCC) 07/03/2021   Type 2 diabetes mellitus with stage 3b chronic kidney disease, without long-term current use of insulin  (HCC) 11/15/2020   OSA (obstructive sleep apnea) 07/31/2020   Other hyperlipidemia 01/10/2020   Insulin  resistance 01/10/2020   Class 2 severe obesity with serious comorbidity and body mass index (BMI) of 39.0 to 39.9 in adult [facility]) 01/10/2020   Peripheral 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) 04/25/2019   CKD (chronic kidney disease), symptom management only, stage 3 (moderate) (HCC) 03/30/2019   Generalized anxiety disorder 03/30/2019   Hypertension associated with type 2 diabetes mellitus (HCC)    Squamous cell carcinoma of cervix (HCC) 03/23/2019   Deficiency anemia 03/22/2019    PCP: n/a  REFERRING PROVIDER: Knute Thersia Bitters, FNP   REFERRING DIAG: M25.50,G89.29 (ICD-10-CM) - Chronic joint pain   Rationale for Evaluation and Treatment: Rehabilitation  THERAPY DIAG:  Bilateral foot pain  Numbness and tingling of right lower extremity  Muscle weakness (generalized)  ONSET DATE: x 1 year  SUBJECTIVE:                                                                                                                                                                                           SUBJECTIVE STATEMENT: My pain stays at about a 5.  Foot is numb.  No changes since eval.  Applied for scholarship for [employer] should hear in the next week.   Initial Subjective My left foot has a lot of  pain.  Had a cortizone shot  and it is better.  Since cancer treatments in 2021 I have a lot of neuropathies in my lower legs and feet.  I also have numbness in right knee through hip after standing for up to 10 minutes.  It happens everyday.  My joints hurt every day hips and knees.  Cancer doctors says my leg/and joint pain is unrelated to the cancer. Pain greater in am when first getting up.  PERTINENT HISTORY:  hx of hypertension, preeclampsia, hyperlipidemia, OSA, diabetes, squamous cell carcinoma of cervix s/p radiation and chemotherapy, morbid obesity. Has had 2 other episodes of aquatic intervention since 2022  PAIN:  Are you having pain? Yes: NPRS scale: current normal 4/10; Pain location: legs and feet Pain description: achy; numbness tingling Aggravating factors: standing >10 mins Relieving factors: sitting x 10 minutes  PRECAUTIONS: None   RED FLAGS: None      WEIGHT BEARING RESTRICTIONS: No   FALLS:  Has patient fallen in last 6 months? No   LIVING ENVIRONMENT: Lives with: lives with their partner Lives in: House/apartment Stairs: split foyer, 6 steps up/down, bilat HR Has following equipment at home: None   PLOF: Independent   OCCUPATION: not working  PLOF: Independent  PATIENT GOALS: get better, find out what's going on  in my back  NEXT MD VISIT: weekly  OBJECTIVE:  Note: Objective measures were completed at Evaluation unless otherwise noted.  DIAGNOSTIC FINDINGS:  N/a As per pt Oa in left foot  PATIENT SURVEYS:  LEFS  Extreme difficulty/unable (0), Quite a bit of difficulty (1), Moderate difficulty (2), Little difficulty (3), No difficulty (4) Survey date:    Any of your usual work, housework or school activities 2  2. Usual hobbies, recreational or sporting activities 1  3. Getting into/out of the bath 2  4. Walking between rooms 3  5. Putting on socks/shoes 1  6. Squatting  2  7. Lifting an object, like a bag of groceries from the floor 3  8. Performing light  activities around your home 3  9. Performing heavy activities around your home 1  10. Getting into/out of a car 2  11. Walking 2 blocks 1  12. Walking 1 mile 0  13. Going up/down 10 stairs (1 flight) 1  14. Standing for 1 hour 0  15.  sitting for 1 hour 0  16. Running on even ground 0  17. Running on uneven ground 0  18. Making sharp turns while running fast 0  19. Hopping  0  20. Rolling over in bed 2  Score total:  24/80     COGNITION: Overall cognitive status: Within functional limits for tasks assessed     SENSATION: Peripheral neuropathies bilat feet  MUSCLE LENGTH: Hamstrings: right hamstring tightness> left   POSTURE: rounded shoulders, forward head, and decreased lumbar lordosis   PALPATION: No TTP  LUMBAR ROM:   wfl  LOWER EXTREMITY ROM:     Full ROM in bilateral hips. Tightness in the hip adductors and right hamstring  LOWER EXTREMITY strength:    HD lbs Right eval Left eval  Hip flexion 35.2 50.0  Hip extension    Hip abduction 21.2 30.9  Hip adduction    Hip internal rotation    Hip external rotation    Knee flexion    Knee extension 20.8 26.3  Ankle dorsiflexion    Ankle plantarflexion    Ankle inversion    Ankle eversion     (Blank  rows = not tested)   FUNCTIONAL TESTS:  5 times sit to stand: 18.79 Timed up and go (TUG): 14.94s   4 stage balance: passed 1&2. Tandem stance small steps to gain position hold x 8s; SLS x6 step GAIT: Distance walked: 500 ft Assistive device utilized: None Level of assistance: Complete Independence Comments: initial gait slightly antalgic.  TREATMENT  [facility] Adult PT Treatment:                                                DATE: 09/30/23 Pt seen for aquatic therapy today.  Treatment took place in water 3.5-4.75 ft in depth at the [facility] pool. Temp of water was 91.  Pt entered/exited the pool via stairs using step to pattern with hand rail.  *Intro to setting *walking forward, back and  side stepping in 3.6 ft with unsupported *side stepping over box ue support RBHB *Ue support on wall: toe raises; heel raises; hip add/abd; hip extension; relaxed squats *straddling noodle: cycling; Ue support corner wall: hip add/abd; hip flex/ext *step ups leading R/L x 10 ue support hand rails->unsupported.  VC for postioning  *tandem stance the SLS ue support RBHB   Pt requires the buoyancy and hydrostatic pressure of water for support, and to offload joints by unweighting joint load by at least 50 % in navel deep water and by at least 75-80% in chest to neck deep water.  Viscosity of the water is needed for resistance of strengthening. Water current perturbations provides challenge to standing balance requiring increased core activation.                                                                                                                                  PATIENT EDUCATION:  Education details: Discussed eval findings, rehab rationale, aquatic program progression/POC and pools in area. Patient is in agreement  Person educated: Patient Education method: Explanation Education comprehension: verbalized understanding  HOME EXERCISE PROGRAM: [id number] from previous aquatic episdoe  ASSESSMENT:  CLINICAL IMPRESSION: Pt demonstrates safety and independence in aquatic setting with therapist instructing from deck.She is confident in setting, moving throughout all depths easily.  Pt is directed through various movement patterns and trials in both sitting and standing positions.  Worked [employer] fast streamer and balance with good toleration .  Cueing provided for rolling through foot and pushing off with toes when amb submerged.  Pt tending to maintain great toe extension L>R throughout gait due to numbness.  Instructions on abdominal bracing for core engagement and to assist in balance. She is a good candidate for aquatic intervention and will benefit from the properties of water to  progress towards functional goals.     Initial Impression Patient is a 48 y.o. f who was seen today for physical therapy evaluation and treatment for  Chronic joint pain . She is well known to [facility] as well as aquatic therapy finishing an episode of pelvic floor in April 2024 and in aquatics 1 year ago for similar dx.  She reports she has been having constant numbness, tingling  in right anterior thigh in past year daily that occurs with standing >10-15 minutes.  She has constant feet pain and toe numbness due to cancer treatments. No falls.  Testing demonstrates rle stronger than left.  She reports she is limited with functional amb due to LE pain. She has aquatic HEP from previous episode. Plan to see her in aquatics and re establish as well as modify/add to program for indep completion.  OBJECTIVE IMPAIRMENTS: Abnormal gait, decreased activity tolerance, decreased balance, difficulty walking, decreased strength, and impaired sensation.    ACTIVITY LIMITATIONS: standing, stairs, and locomotion level   PARTICIPATION LIMITATIONS: cleaning and community activity   PERSONAL FACTORS: Time since onset of injury/illness/exacerbation and 1-2 comorbidities: PMH are also affecting patient's functional outcome.    REHAB POTENTIAL: Good   CLINICAL DECISION MAKING: Evolving/moderate complexity   EVALUATION COMPLEXITY: Moderate   GOALS: Goals reviewed with patient? Yes  SHORT TERM GOALS: Target date: 10/01/23  Pt will tolerate full aquatic sessions consistently without increase in pain and with improving function to demonstrate good toleration and effectiveness of intervention.  Baseline: Goal status: INITIAL  2.  Pt will report gaining pool access Baseline:  Goal status: INITIAL    LONG TERM GOALS: Target date: 11/12/23  Pt to improve on LEFS by at least 9 point to demonstrate statistically significant Improvement in function. Baseline: 24/80 Goal status: INITIAL  2.  Pt will be  indep with aquatic final HEP for continued management of condition Baseline:  Goal status: INITIAL  3.  Pt will report decrease in right le numbness and tingling by 50% Baseline:  Goal status: INITIAL  4.  Pt will report walking dog up to 3 x week to and from mailbox without SOB or excessive pain. Baseline:  Goal status: INITIAL    PLAN:  PT FREQUENCY: 1x/week  PT DURATION: 10 weeks will be delayed 4 weeks due to scheduling conflicts. ^ visit only likely  PLANNED INTERVENTIONS: 97164- PT Re-evaluation, 97750- Physical Performance Testing, 97110-Therapeutic exercises, 97530- Therapeutic activity, [id number]- Neuromuscular re-education, 97535- Self Care, [id number]- Manual therapy, [phone]- Gait training, [phone]- Orthotic Initial, [phone]- Aquatic Therapy, [phone] (1-2 muscles), 20561 (3+ muscles)- Dry Needling, Patient/Family education, Balance training, Stair training, Taping, Joint mobilization, DME instructions, Cryotherapy, and Moist heat.  PLAN FOR NEXT SESSION: Aquatics for le and core strengthening, balance retraining and pain management. HEP   Ronal [place]) Aries Kasa MPT 09/30/23 11:04 AM [facility] [address] [place], [place], [address] Phone: [phone]   Fax:  [phone]  For all possible CPT codes, reference the Planned Interventions line above.     Check all conditions that are expected to impact treatment: {Conditions expected to impact treatment:Morbid obesity, Diabetes mellitus, and Musculoskeletal disorders   If treatment provided at initial evaluation, no treatment charged due to lack of authorization.

## 2023-10-01 ENCOUNTER — Inpatient Hospital Stay: Attending: Genetic Counselor | Admitting: Genetic Counselor

## 2023-10-01 ENCOUNTER — Inpatient Hospital Stay

## 2023-10-01 ENCOUNTER — Encounter: Payer: Self-pay | Admitting: Genetic Counselor

## 2023-10-01 DIAGNOSIS — Z8051 Family history of malignant neoplasm of kidney: Secondary | ICD-10-CM

## 2023-10-01 DIAGNOSIS — Z808 Family history of malignant neoplasm of other organs or systems: Secondary | ICD-10-CM

## 2023-10-01 DIAGNOSIS — Z8049 Family history of malignant neoplasm of other genital organs: Secondary | ICD-10-CM

## 2023-10-01 DIAGNOSIS — C539 Malignant neoplasm of cervix uteri, unspecified: Secondary | ICD-10-CM

## 2023-10-01 DIAGNOSIS — Z8541 Personal history of malignant neoplasm of cervix uteri: Secondary | ICD-10-CM | POA: Diagnosis present

## 2023-10-01 LAB — GENETIC SCREENING ORDER

## 2023-10-01 NOTE — Progress Notes (Signed)
 REFERRING PROVIDER: Knute Thersia Bitters, FNP [address] [place],  [place] [address]  PRIMARY PROVIDER:  Knute Thersia Bitters, FNP  PRIMARY REASON FOR VISIT:  1. Squamous cell carcinoma of cervix (HCC)   2. Family history of cervical cancer   3. Family history of brain cancer   4. Family history of kidney cancer      HISTORY OF PRESENT ILLNESS:   Tamara Osborne, a 48 y.o. female, was seen for a [place] cancer genetics consultation at the request of Knute, Thersia Bitters, * due to a personal and family history of cancer.  Tamara Osborne presents to clinic today to discuss the possibility of a hereditary predisposition to cancer, to discuss genetic testing, and to further clarify her future cancer risks, as well as potential cancer risks for family members.   In 2021, at the age of 77, Tamara Osborne was diagnosed with squamous cell carcinoma of the cervix. She was treated with radiation and chemotherapy.   CANCER HISTORY:  Oncology History  Squamous cell carcinoma of cervix (HCC)  03/21/2019 Pathology Results   The biopsy is superficial and therefore depth of invasion cannot be  determined.  There is at least carcinoma in-situ.    03/23/2019 Initial Diagnosis   Squamous cell carcinoma of cervix (HCC)   03/23/2019 Pathology Results   CERVIX, 11 O CLOCK, BIOPSY:  -  Squamous cell carcinoma, invasive  -  See comment   03/23/2019 - 06/05/2019 Radiation Therapy   External beam therapy was from 03/23/2019 - 05/04/2019. HDR vaginal brachytherapy was from 05/19/2019 - 06/05/2019.   Site Technique Total Dose (Gy) Dose per Fx (Gy) Completed Fx Beam Energies  Cervix: Cervix HDR-brachy 5.5/5.5 5.5 1/1 Ir-192  Cervix: Cervix HDR-brachy 5.5/5.5 5.5 1/1 Ir-192  Cervix: Cervix_Bst HDR-brachy 5.5/5.5 5.5 1/1 Ir-192  Cervix: Cervix HDR-brachy 5.5/5.5 5.5 1/1 Ir-192  Cervix: Cervix 3D 45/45 1.8 25/25 10X, 15X  Cervix: Cervix HDR-brachy 5.5/5.5 5.5 1/1 Ir-192  Cervix:  Cervix_Bst 3D 9/9 1.8 5/5 15X      03/28/2019 PET scan   1. Hypermetabolic cervical mass measures roughly 7.6 by 6.9 by 4.9 cm, compatible with malignancy. No adenopathy or distant metastatic spread identified. 2. Extending cephalad and anterior to the uterine fundus, there is a 450 cubic cm simple fluid density structure without hypermetabolic activity. This may represent a right adnexal cyst (favored), peritoneal inclusion cyst, or (if the patient has a remote history of pancreatitis) a remote pseudocyst. 3. Faint ground density nodularity in both lower lobes which could be from atypical infection or extrinsic allergic alveolitis. Given nodular appearance of some of this ground-glass density, I would recommend follow up chest CT in 3 months time in order to ensure clearance. 4. Moderate cardiomegaly, cause uncertain. The patient also has advanced for age/gender atherosclerosis. Aortic Atherosclerosis (ICD10-I70.0).     04/04/2019 Procedure   Successful placement of a right internal jugular approach power injectable Port-A-Cath. The catheter is ready for immediate use   04/07/2019 - 05/12/2019 Chemotherapy   The patient had weekly cisplatin  for chemotherapy treatment.     09/06/2019 PET scan   1. No evidence residual hypermetabolic cervical tissue. 2. No evidence of metastatic cervical carcinoma. 3. Large benign cystic mass unchanged in the upper pelvis.          RISK FACTORS:  Menarche was at age 12.  First live birth at age 23.  OCP use for approximately 0 years.  Ovaries intact: yes.  Uterus intact: yes.  Menopausal status: postmenopausal.  HRT use: estrogen cream  Colonoscopy: no; not examined. Mammogram within the last year: no. 2022, density B Number of breast biopsies: 0. Up to date with pelvic exams: yes. Reports growing [place] in [place], feels high rates of cancer in this community   Past Medical History:  Diagnosis Date   Anemia    Angio-edema    Anxiety     Bartholin cyst    Cervical cancer (HCC)    Chronic kidney disease    Constipation    Depression    Deviated septum    Diabetes mellitus without complication (HCC)    Difficult intravenous access    used port for 05-09-2019 surgery   Fibroids    History of blood transfusion    2 units given 04-26-2019, has had total of 8 units since jan 2021   History of blood transfusion 1 unit given 05-30-19   iv fluids also given   History of cervical cancer 07/16/2022   History of kidney problems    History of radiation therapy 03/23/2019-05/04/2019   Cervical external beam   Dr Lynwood Nasuti   History of radiation therapy 05/09/2019-06/05/2019   vaginal brachytherapy   Dr Lynwood Nasuti   History of recent blood transfusion 05/16/2019   2 units given  per dr melrose at cancer center   Hypertension    Neuropathy    both thumbs   Obesity    Palpitations    PONV (postoperative nausea and vomiting)    Prediabetes    Preeclampsia 2006   Sleep apnea    no cpap used insurance would not cover, osa severe per pt   SOB (shortness of breath)    Squamous cell carcinoma of cervix (HCC) 03/23/2019   Swallowing difficulty    Symptomatic skin lesion 07/14/2022   Vitamin D  deficiency 01/10/2020    Past Surgical History:  Procedure Laterality Date   CESAREAN SECTION WITH BILATERAL TUBAL LIGATION  11/10/2004       DILATION AND CURETTAGE OF UTERUS  1997   IR IMAGING GUIDED PORT INSERTION  04/04/2019   IR REMOVAL TUN ACCESS W/ PORT W/O FL MOD SED  10/27/2021   OPERATIVE ULTRASOUND N/A 05/09/2019   Procedure: OPERATIVE ULTRASOUND;  Surgeon: Nasuti Lynwood, MD;  Location: [facility];  Service: Urology;  Laterality: N/A;   OPERATIVE ULTRASOUND N/A 05/15/2019   Procedure: OPERATIVE ULTRASOUND;  Surgeon: Nasuti Lynwood, MD;  Location: [facility];  Service: Urology;  Laterality: N/A;   OPERATIVE ULTRASOUND N/A 05/22/2019   Procedure: OPERATIVE ULTRASOUND;  Surgeon: Nasuti Lynwood, MD;   Location: [facility];  Service: Urology;  Laterality: N/A;   OPERATIVE ULTRASOUND N/A 05/29/2019   Procedure: OPERATIVE ULTRASOUND;  Surgeon: Nasuti Lynwood, MD;  Location: [facility];  Service: Urology;  Laterality: N/A;   OPERATIVE ULTRASOUND N/A 06/05/2019   Procedure: OPERATIVE ULTRASOUND;  Surgeon: Nasuti Lynwood, MD;  Location: [facility];  Service: Urology;  Laterality: N/A;   TANDEM RING INSERTION N/A 05/09/2019   Procedure: TANDEM RING INSERTION;  Surgeon: Nasuti Lynwood, MD;  Location: [facility];  Service: Urology;  Laterality: N/A;   TANDEM RING INSERTION N/A 05/15/2019   Procedure: TANDEM RING INSERTION;  Surgeon: Nasuti Lynwood, MD;  Location: [facility];  Service: Urology;  Laterality: N/A;   TANDEM RING INSERTION N/A 05/22/2019   Procedure: TANDEM RING INSERTION;  Surgeon: Nasuti Lynwood, MD;  Location: [facility];  Service: Urology;  Laterality: N/A;   TANDEM RING INSERTION N/A 05/29/2019   Procedure: TANDEM RING INSERTION;  Surgeon: Shannon Agent, MD;  Location: [facility];  Service: Urology;  Laterality: N/A;   TANDEM RING INSERTION N/A 06/05/2019   Procedure: TANDEM RING INSERTION;  Surgeon: Shannon Agent, MD;  Location: [facility];  Service: Urology;  Laterality: N/A;    Social History   Socioeconomic History   Marital status: Single    Spouse name: Not on file   Number of children: 4   Years of education: Not on file   Highest education level: Not on file  Occupational History   Not on file  Tobacco Use   Smoking status: Never   Smokeless tobacco: Never  Vaping Use   Vaping status: Never Used  Substance and Sexual Activity   Alcohol use: No   Drug use: No   Sexual activity: Not Currently    Birth control/protection: Surgical  Other Topics Concern   Not on file  Social History Narrative   Lives with her boyfriend   Social Drivers of Health    Financial Resource Strain: High Risk (04/23/2023)   Overall Financial Resource Strain (CARDIA)    Difficulty of Paying Living Expenses: Very hard  Food Insecurity: No Food Insecurity (10/08/2022)   Hunger Vital Sign    Worried About Running Out of Food in the Last Year: Never true    Ran Out of Food in the Last Year: Never true  Transportation Needs: No Transportation Needs (10/08/2022)   PRAPARE - Administrator, Civil Service (Medical): No    Lack of Transportation (Non-Medical): No  Physical Activity: Insufficiently Active (10/08/2022)   Exercise Vital Sign    Days of Exercise per Week: 2 days    Minutes of Exercise per Session: 40 min  Stress: No Stress Concern Present (04/23/2023)   [employer] of Occupational Health - Occupational Stress Questionnaire    Feeling of Stress : Not at all  Social Connections: Socially Isolated (04/23/2023)   Social Connection and Isolation Panel    Frequency of Communication with Friends and Family: More than three times a week    Frequency of Social Gatherings with Friends and Family: Once a week    Attends Religious Services: Never    Database administrator or Organizations: No    Attends Engineer, structural: Never    Marital Status: Never married     FAMILY HISTORY:  We obtained a detailed, 4-generation family history.  Significant diagnoses are listed below: Family History  Problem Relation Age of Onset   Brain cancer Mother 24   Hypertension Mother    Depression Mother    Anxiety disorder Mother    Lung cancer Mother 19   Diabetes Father    Kidney disease Father    Kidney cancer Father 56   Heart attack Sister    Cervical cancer Sister 13   Heart failure Paternal Aunt    Heart attack Paternal Uncle    Cancer Maternal Grandmother        possibly lung cancer?   Kidney failure Maternal Grandfather    Leukemia Paternal Grandmother 78   Heart attack Paternal Grandfather    Cervical cancer Maternal Cousin     Brain cancer Paternal Cousin 64    Tamara Osborne is unaware of previous family history of genetic testing for hereditary cancer risks. There is no reported Ashkenazi Jewish ancestry.     GENETIC COUNSELING ASSESSMENT: Tamara Osborne  is a 48 y.o. female with a personal and family history of cancer which is somewhat suggestive of a hereditary predisposition to cancer given her family history of a first degree relative diagnosed with renal cell carcinoma under the age or 22. We, therefore, discussed and recommended the following at today's visit.   DISCUSSION: We discussed that 5 - 10% of cancer is hereditary. We discussed that testing is beneficial for several reasons including knowing how to follow individuals after completing their treatment, identifying whether potential treatment options would be beneficial, and understanding if other family members could be at risk for cancer and allowing them to undergo genetic testing. We discussed that many cancers, such as cervical cancer and lung cancer, typically have environmental causes instead of hereditary causes.   We reviewed the characteristics, features and inheritance patterns of hereditary cancer syndromes. We also discussed genetic testing, including the appropriate family members to test, the process of testing, insurance coverage and turn-around-time for results. We discussed the implications of a negative, positive, carrier and/or variant of uncertain significant result. We recommended Tamara Osborne pursue genetic testing for a panel that includes genes associated with kidney cancer.   Tamara Osborne  was offered a common hereditary cancer panel (40 genes) and an expanded pan-cancer panel (77 genes). Tamara Osborne was informed of the benefits and limitations of each panel, including that expanded pan-cancer panels contain genes that do not have clear management guidelines at this point in time.  We also discussed that as the number of genes included on a  panel increases, the chances of variants of uncertain significance increases. After considering the benefits and limitations of each gene panel, Tamara Osborne elected to have [facility] CancerNext-Expanded +RNAinsight panel.  Based on Tamara Osborne personal and family history of cancer, she meets medical criteria for genetic testing. Despite that she meets criteria, she may still have an out of pocket cost. We discussed that if her out of pocket cost for testing is over $100, the laboratory should contact them to discuss self-pay prices, patient pay assistance programs, if applicable, and other billing options.  PLAN: After considering the risks, benefits, and limitations, Tamara Osborne provided informed consent to pursue genetic testing and the blood sample was sent to [facility] for analysis of the CancerNext-Expanded +RNAinsight panel. Results should be available within approximately 2-3 weeks' time, at which point they will be disclosed by telephone to Tamara Osborne, as will any additional recommendations warranted by these results. Tamara Osborne will receive a summary of her genetic counseling visit and a copy of her results once available. This information will also be available in Epic.   Lastly, we encouraged Tamara Osborne to remain in contact with cancer genetics annually so that we can continuously update the family history and inform her of any changes in cancer genetics and testing that may be of benefit for this family.   Tamara Osborne questions were answered to her satisfaction today. Our contact information was provided should additional questions or concerns arise. Thank you for the referral and allowing us  to share in the care of your patient.   Burnard Ogren, MS, [facility] Licensed, Retail [email or web address] phone: [phone]   60 minutes were spent on the date of the encounter in service to the patient including preparation, face-to-face consultation,  documentation and care coordination.  The patient was seen alone.  Drs. Gudena and/or Lanny were available to discuss this case as needed.   _______________________________________________________________________ For Office Staff:  Number of people  involved in session: 1 Was an Intern/ student involved with case: no

## 2023-10-06 ENCOUNTER — Encounter: Payer: Self-pay | Admitting: Gastroenterology

## 2023-10-06 ENCOUNTER — Ambulatory Visit: Admitting: Gastroenterology

## 2023-10-06 VITALS — BP 120/80 | HR 104 | Ht 61.0 in | Wt 229.0 lb

## 2023-10-06 DIAGNOSIS — K760 Fatty (change of) liver, not elsewhere classified: Secondary | ICD-10-CM

## 2023-10-06 DIAGNOSIS — K862 Cyst of pancreas: Secondary | ICD-10-CM | POA: Diagnosis not present

## 2023-10-06 DIAGNOSIS — K5790 Diverticulosis of intestine, part unspecified, without perforation or abscess without bleeding: Secondary | ICD-10-CM | POA: Diagnosis not present

## 2023-10-06 DIAGNOSIS — Z1211 Encounter for screening for malignant neoplasm of colon: Secondary | ICD-10-CM

## 2023-10-06 MED ORDER — NA SULFATE-K SULFATE-MG SULF 17.5-3.13-1.6 GM/177ML PO SOLN: 1.0000 | ORAL | 0 refills | Status: DC

## 2023-10-06 NOTE — Progress Notes (Signed)
 GASTROENTEROLOGY OUTPATIENT CLINIC VISIT   Primary Care Provider Knute Thersia Bitters, FNP [address] [place] [address] [phone]  Referring Provider Knute Thersia Bitters, FNP [address] [place],  [place] [address] [phone]  Patient Profile: Tamara Osborne is a 48 y.o. female with a pmh significant for diabetes, chronic renal insufficiency, prior cervical cancer, IBS, pancreatic cyst, fatty liver (imaging), diverticulosis.  The patient presents to the [facility] for an evaluation and management of problem(s) noted below:  Problem List 1. Pancreatic cyst   2. Colon cancer screening   3. Fatty liver   4. Diverticulosis    Discussed the use of AI scribe software for clinical note transcription with the patient, who gave verbal consent to proceed.  History of Present Illness This is the patient's first visit to the [facility] GI clinic.  Tamara Osborne is a 48 year old female who presents for evaluation of asymptomatic pancreatic cyst noted on imaging as well as fatty liver on imaging.  She has been experiencing hematuria for approximately six months, initially attributed to urinary tract infections. Per her report a cystoscopy has been performed.  During the workup for hematuria, a CT scan revealed a cyst on her pancreas.  She did not know about this.  She has no abdominal pain or discomfort and maintains regular bowel movements without blood in the stool. She experiences occasional diarrhea related to dietary choices after taking Wegovy  for diabetes management. She has a history of cervical cancer treated with radiation and chemotherapy and is currently under surveillance.  She has never had pancreatitis or pancreatic disease that she is aware of.  She has not undergone any form of Colon cancer screening but is amenable to moving forward with it. She has not had abnormal liver testing in the past that  she is aware of.  She has no family history of colon disease or liver disease.   GI Review of Systems Positive as above Negative for dysphagia, odynophagia, nausea, vomiting, melena, hematochezia  Review of Systems General: Denies fevers/chills/weight loss unintentionally Cardiovascular: Denies chest pain Pulmonary: Denies shortness of breath Gastroenterological: See HPI Genitourinary: Denies darkened urine Hematological: Denies easy bruising/bleeding Dermatological: Denies jaundice Psychological: Mood is stable  Medications Current Outpatient Medications  Medication Sig Dispense Refill   Accu-Chek Softclix Lancets lancets Use to check blood sugar TID. 100 each 2   amLODipine -valsartan  (EXFORGE ) 10-320 MG tablet Take 1 tablet by mouth daily. 90 tablet 3   azelastine  (ASTELIN ) 0.1 % nasal spray Place 1 spray into both nostrils 2 (two) times daily as needed. Use in each nostril as directed 30 mL 5   Blood Glucose Monitoring Suppl (ACCU-CHEK GUIDE) w/Device KIT Use to check blood sugar TID. 1 kit 0   buPROPion (WELLBUTRIN XL) 150 MG 24 hr tablet Take 150 mg by mouth daily.     chlorhexidine  (HIBICLENS ) 4 % external liquid Apply topically daily as needed. 236 mL 0   Cholecalciferol 50 MCG (2000 UT) TABS Take by mouth.     conjugated estrogens  (PREMARIN ) vaginal cream Place 1 Applicatorful vaginally every other day. 42.5 g 12   EPINEPHrine  (EPIPEN  2-PAK) 0.3 mg/0.3 mL IJ SOAJ injection Inject 0.3 mg into the muscle as needed for anaphylaxis. 2 each 1   fluticasone  (FLONASE ) 50 MCG/ACT nasal spray Place 2 sprays into both nostrils daily. 16 g 5   glucose blood (ACCU-CHEK GUIDE) test strip Use to check blood sugar TID. 100 each 2   ipratropium (ATROVENT ) 0.06 %  nasal spray Place 2 sprays into both nostrils 3 (three) times daily as needed for rhinitis (drainage). 15 mL 5   levocetirizine (XYZAL ) 5 MG tablet Take 1 tablet (5 mg total) by mouth every evening. 90 tablet 1   mupirocin  ointment  (BACTROBAN ) 2 % Apply 1 Application topically 2 (two) times daily. 60 g 0   Na Sulfate-K Sulfate-Mg Sulfate concentrate (SUPREP BOWEL PREP KIT) 17.5-3.13-1.6 GM/177ML SOLN Take 1 kit (354 mLs total) by mouth as directed. For colonoscopy prep 354 mL 0   RESTASIS 0.05 % ophthalmic emulsion 1 drop 2 (two) times daily.     Semaglutide , 1 MG/DOSE, 4 MG/3ML SOPN Inject 1 mg as directed once a week. 9 mL 0   No current facility-administered medications for this visit.    Allergies Allergies  Allergen Reactions   Bee Venom    Penicillins     Not sure a child allergy    Shellfish Allergy  Swelling    Seafood also any kind   Sulfa Antibiotics     Not sure a child allergy     Histories Past Medical History:  Diagnosis Date   Anemia    Angio-edema    Anxiety    Bartholin cyst    Cervical cancer (HCC)    Chronic kidney disease    Constipation    Depression    Deviated septum    Diabetes mellitus without complication (HCC)    Difficult intravenous access    used port for 05-09-2019 surgery   Fibroids    History of blood transfusion    2 units given 04-26-2019, has had total of 8 units since jan 2021   History of blood transfusion 1 unit given 05-30-19   iv fluids also given   History of cervical cancer 07/16/2022   History of kidney problems    History of radiation therapy 03/23/2019-05/04/2019   Cervical external beam   Dr Lynwood Nasuti   History of radiation therapy 05/09/2019-06/05/2019   vaginal brachytherapy   Dr Lynwood Nasuti   History of recent blood transfusion 05/16/2019   2 units given  per dr melrose at cancer center   Hypertension    Neuropathy    both thumbs   Obesity    Palpitations    PONV (postoperative nausea and vomiting)    Prediabetes    Preeclampsia 2006   Sleep apnea    no cpap used insurance would not cover, osa severe per pt   SOB (shortness of breath)    Squamous cell carcinoma of cervix (HCC) 03/23/2019   Swallowing difficulty    Symptomatic skin lesion  07/14/2022   Vitamin D  deficiency 01/10/2020   Past Surgical History:  Procedure Laterality Date   CESAREAN SECTION WITH BILATERAL TUBAL LIGATION  11/10/2004       DILATION AND CURETTAGE OF UTERUS  1997   IR IMAGING GUIDED PORT INSERTION  04/04/2019   IR REMOVAL TUN ACCESS W/ PORT W/O FL MOD SED  10/27/2021   OPERATIVE ULTRASOUND N/A 05/09/2019   Procedure: OPERATIVE ULTRASOUND;  Surgeon: Nasuti Lynwood, MD;  Location: [facility];  Service: Urology;  Laterality: N/A;   OPERATIVE ULTRASOUND N/A 05/15/2019   Procedure: OPERATIVE ULTRASOUND;  Surgeon: Nasuti Lynwood, MD;  Location: [facility];  Service: Urology;  Laterality: N/A;   OPERATIVE ULTRASOUND N/A 05/22/2019   Procedure: OPERATIVE ULTRASOUND;  Surgeon: Nasuti Lynwood, MD;  Location: [facility];  Service: Urology;  Laterality: N/A;   OPERATIVE ULTRASOUND N/A 05/29/2019   Procedure:  OPERATIVE ULTRASOUND;  Surgeon: Shannon Agent, MD;  Location: [facility];  Service: Urology;  Laterality: N/A;   OPERATIVE ULTRASOUND N/A 06/05/2019   Procedure: OPERATIVE ULTRASOUND;  Surgeon: Shannon Agent, MD;  Location: [facility];  Service: Urology;  Laterality: N/A;   TANDEM RING INSERTION N/A 05/09/2019   Procedure: TANDEM RING INSERTION;  Surgeon: Shannon Agent, MD;  Location: [facility];  Service: Urology;  Laterality: N/A;   TANDEM RING INSERTION N/A 05/15/2019   Procedure: TANDEM RING INSERTION;  Surgeon: Shannon Agent, MD;  Location: [facility];  Service: Urology;  Laterality: N/A;   TANDEM RING INSERTION N/A 05/22/2019   Procedure: TANDEM RING INSERTION;  Surgeon: Shannon Agent, MD;  Location: [facility];  Service: Urology;  Laterality: N/A;   TANDEM RING INSERTION N/A 05/29/2019   Procedure: TANDEM RING INSERTION;  Surgeon: Shannon Agent, MD;  Location: [facility];  Service: Urology;  Laterality: N/A;   TANDEM RING  INSERTION N/A 06/05/2019   Procedure: TANDEM RING INSERTION;  Surgeon: Shannon Agent, MD;  Location: [facility];  Service: Urology;  Laterality: N/A;   Social History   Socioeconomic History   Marital status: Single    Spouse name: Not on file   Number of children: 4   Years of education: Not on file   Highest education level: Not on file  Occupational History   Not on file  Tobacco Use   Smoking status: Never   Smokeless tobacco: Never  Vaping Use   Vaping status: Never Used  Substance and Sexual Activity   Alcohol use: No   Drug use: No   Sexual activity: Not Currently    Birth control/protection: Surgical  Other Topics Concern   Not on file  Social History Narrative   Lives with her boyfriend   Social Drivers of Health   Financial Resource Strain: High Risk (04/23/2023)   Overall Financial Resource Strain (CARDIA)    Difficulty of Paying Living Expenses: Very hard  Food Insecurity: No Food Insecurity (10/08/2022)   Hunger Vital Sign    Worried About Running Out of Food in the Last Year: Never true    Ran Out of Food in the Last Year: Never true  Transportation Needs: No Transportation Needs (10/08/2022)   PRAPARE - Administrator, Civil Service (Medical): No    Lack of Transportation (Non-Medical): No  Physical Activity: Insufficiently Active (10/08/2022)   Exercise Vital Sign    Days of Exercise per Week: 2 days    Minutes of Exercise per Session: 40 min  Stress: No Stress Concern Present (04/23/2023)   [employer] of Occupational Health - Occupational Stress Questionnaire    Feeling of Stress : Not at all  Social Connections: Socially Isolated (04/23/2023)   Social Connection and Isolation Panel    Frequency of Communication with Friends and Family: More than three times a week    Frequency of Social Gatherings with Friends and Family: Once a week    Attends Religious Services: Never    Database administrator or Organizations: No     Attends Banker Meetings: Never    Marital Status: Never married  Intimate Partner Violence: Not At Risk (04/23/2023)   Humiliation, Afraid, Rape, and Kick questionnaire    Fear of Current or Ex-Partner: No    Emotionally Abused: No    Physically Abused: No    Sexually Abused: No   Family History  Problem Relation Age of Onset   Brain cancer Mother 15   Hypertension Mother    Depression Mother    Anxiety disorder Mother    Lung cancer Mother 5   Diabetes Father    Kidney disease Father    Kidney cancer Father 34   Heart attack Sister    Cervical cancer Sister 56   Heart failure Paternal Aunt    Heart attack Paternal Uncle    Cancer Maternal Grandmother        possibly lung cancer?   Kidney failure Maternal Grandfather    Leukemia Paternal Grandmother 31   Heart attack Paternal Grandfather    Brain cancer Paternal Cousin 51   Cervical cancer Maternal Cousin    Colon cancer Neg Hx    Esophageal cancer Neg Hx    Inflammatory bowel disease Neg Hx    Liver disease Neg Hx    Pancreatic cancer Neg Hx    Rectal cancer Neg Hx    Stomach cancer Neg Hx    I have reviewed her medical, social, and family history in detail and updated the electronic medical record as necessary.    PHYSICAL EXAMINATION  BP 120/80   Pulse (!) 104   Ht 5' 1 (1.549 m)   Wt 229 lb (103.9 kg)   LMP  (LMP Unknown)   BMI 43.27 kg/m  Wt Readings from Last 3 Encounters:  10/06/23 229 lb (103.9 kg)  09/21/23 231 lb (104.8 kg)  09/07/23 228 lb (103.4 kg)  GEN: NAD, appears stated age, doesn't appear chronically ill PSYCH: Cooperative, without pressured speech EYE: Conjunctivae pink, sclerae anicteric ENT: MMM CV: Nontachycardic RESP: No audible wheezing GI: NABS, soft, protuberant, rounded, NT/ND, without rebound  MSK/EXT: Bilateral pedal edema present SKIN: No jaundice, no spider angiomata NEURO:  Alert & Oriented x 3, no focal deficits   REVIEW OF DATA  I reviewed the following  data at the time of this encounter:  GI Procedures and Studies  No relevant studies to review  Laboratory Studies  Reviewed those in epic  Imaging Studies  May 2025 CT hematuria abdomen pelvis IMPRESSION: 1. No hydronephrosis. No renal, ureteral or bladder calculi. 2. No solid enhancing renal mass. 3. Urinary bladder is unremarkable for degree of distension given limited evaluation due to urine contamination. 4. Small hiatal hernia with mild symmetric distal esophageal wall thickening, which may reflect esophagitis. 5. 10 mm cyst in the pancreatic tail, likely a side branch IPMN. Recommend follow up pre and post contrast MRI/MRCP or pancreatic protocol CT. 6. Diffuse hepatic steatosis. 7. Colonic diverticulosis without evidence of acute diverticulitis.   ASSESSMENT  Ms. Reither is a 48 y.o. female.  The patient is seen today for evaluation and management of:  1. Pancreatic cyst   2. Colon cancer screening   3. Fatty liver   4. Diverticulosis    The patient is clinically and hemodynamically stable from a GI perspective at this time.  Her pancreatic cyst is not meeting criteria for endoscopic ultrasound evaluation at this time.  As we are noting this for the very first time, short interval imaging is recommended we will pursue that at a 61-month interval.  Pending the results of that MRI/MRCP, we will determine the surveillance that we will likely need to pursue of this versus if additional workup may be required.  The risks of an EUS including intestinal perforation, bleeding, infection, aspiration, and medication effects were discussed as was the possibility it may not give a definitive  diagnosis if a biopsy is performed.  When a biopsy of the pancreas is done as part of the EUS, there is an additional risk of pancreatitis at the rate of about 1-2%.  It was explained that procedure related pancreatitis is typically mild, although it can be severe and even life threatening, which is why  we do not perform random pancreatic biopsies and only biopsy a lesion/area we feel is concerning enough to warrant the risk.  Patient is due for colon cancer screening and different forms of screening were discussed.  Patient has agreed to move forward with colonoscopy.  The risks and benefits of endoscopic evaluation were discussed with the patient; these include but are not limited to the risk of perforation, infection, bleeding, missed lesions, lack of diagnosis, severe illness requiring hospitalization, as well as anesthesia and sedation related illnesses.  The patient and/or family is agreeable to proceed.  Thankfully her liver biochemical testing is normal.  She should have this checked 2-3 times per year in the setting of her hepatic steatosis noted on imaging.  Continue weight loss goals and continued GLP-1 likely to be helpful for individual/patient.  All patient questions were answered to the best of my ability, and the patient agrees to the aforementioned plan of action with follow-up as indicated.   PLAN  MRI/MRCP by November 2025 - Surveillance to be determined based on results at that time Colon cancer screening colonoscopy to be scheduled - Hold GLP x 1 week prior LFTs should be monitored times yearly by PCP, and we should be updated if anything develops or progresses in regards to transferase elevations or abnormal LFTs Continue weight loss goals Continue GLP Heart healthy diet/Mediterranean diet recommended   Orders Placed This Encounter  Procedures   Ambulatory referral to Gastroenterology    New Prescriptions   NA SULFATE-K SULFATE-MG SULFATE CONCENTRATE (SUPREP BOWEL PREP KIT) 17.5-3.13-1.6 GM/177ML SOLN    Take 1 kit (354 mLs total) by mouth as directed. For colonoscopy prep   Modified Medications   No medications on file    Planned Follow Up No follow-ups on file.   Total Time in Face-to-Face and in Coordination of Care for patient including independent/personal  interpretation/review of prior testing, medical history, examination, medication adjustment, communicating results with the patient directly, and documentation within the EHR is 45 minutes.   Aloha Finner, MD [facility] Advanced [facility]oscopy Office # [phone]

## 2023-10-06 NOTE — Patient Instructions (Signed)
 We have sent the following medications to your pharmacy for you to pick up at your convenience: Suprep   You have been scheduled for a colonoscopy. Please follow written instructions given to you at your visit today.   If you use inhalers (even only as needed), please bring them with you on the day of your procedure.  DO NOT TAKE 7 DAYS PRIOR TO TEST- Trulicity (dulaglutide) Ozempic , Wegovy  (semaglutide ) Mounjaro (tirzepatide) Bydureon Bcise (exanatide extended release)  DO NOT TAKE 1 DAY PRIOR TO YOUR TEST Rybelsus  (semaglutide ) Adlyxin (lixisenatide) Victoza  (liraglutide ) Byetta (exanatide) ___________________________________________________________________________  Tamara Osborne will need a MRI/MRCP in November 2025. You will be contacted to schedule at later time.     Due to recent changes in healthcare laws, you may see the results of your imaging and laboratory studies on MyChart before your provider has had a chance to review them.  We understand that in some cases there may be results that are confusing or concerning to you. Not all laboratory results come back in the same time frame and the provider may be waiting for multiple results in order to interpret others.  Please give us  48 hours in order for your provider to thoroughly review all the results before contacting the office for clarification of your results.   _______________________________________________________  If your blood pressure at your visit was 140/90 or greater, please contact your primary care physician to follow up on this.  _______________________________________________________  If you are age 47 or older, your body mass index should be between 23-30. Your Body mass index is 43.27 kg/m. If this is out of the aforementioned range listed, please consider follow up with your Primary Care Provider.  If you are age 31 or younger, your body mass index should be between 19-25. Your Body mass index is 43.27 kg/m. If  this is out of the aformentioned range listed, please consider follow up with your Primary Care Provider.   ________________________________________________________  The [facility] providers would like to encourage you to use MYCHART to communicate with providers for non-urgent requests or questions.  Due to long hold times on the telephone, sending your provider a message by [facility] may be a faster and more efficient way to get a response.  Please allow 48 business hours for a response.  Please remember that this is for non-urgent requests.  _______________________________________________________  [facility] is using a team-based approach to care.  Your team is made up of your doctor and two to three APPS. Our APPS (Nurse Practitioners and Physician Assistants) work with your physician to ensure care continuity for you. They are fully qualified to address your health concerns and develop a treatment plan. They communicate directly with your gastroenterologist to care for you. Seeing the Advanced Practice Practitioners on your physician's team can help you by facilitating care more promptly, often allowing for earlier appointments, access to diagnostic testing, procedures, and other specialty referrals.   Thank you for choosing me and [facility].  Dr. Wilhelmenia

## 2023-10-07 ENCOUNTER — Encounter (HOSPITAL_BASED_OUTPATIENT_CLINIC_OR_DEPARTMENT_OTHER): Payer: Self-pay | Admitting: Physical Therapy

## 2023-10-07 ENCOUNTER — Ambulatory Visit (HOSPITAL_BASED_OUTPATIENT_CLINIC_OR_DEPARTMENT_OTHER): Attending: Family Medicine | Admitting: Physical Therapy

## 2023-10-07 DIAGNOSIS — M79671 Pain in right foot: Secondary | ICD-10-CM | POA: Insufficient documentation

## 2023-10-07 DIAGNOSIS — R202 Paresthesia of skin: Secondary | ICD-10-CM | POA: Diagnosis present

## 2023-10-07 DIAGNOSIS — R200 Anesthesia of skin: Secondary | ICD-10-CM | POA: Insufficient documentation

## 2023-10-07 DIAGNOSIS — M6281 Muscle weakness (generalized): Secondary | ICD-10-CM | POA: Diagnosis present

## 2023-10-07 DIAGNOSIS — M79672 Pain in left foot: Secondary | ICD-10-CM | POA: Diagnosis present

## 2023-10-07 NOTE — Therapy (Signed)
 OUTPATIENT PHYSICAL THERAPY THORACOLUMBAR TREATMENT   Patient Name: Tamara Osborne MRN: [id number] DOB:03-11-1975, 48 y.o., female Today's Date: 10/07/2023  END OF SESSION:  PT End of Session - 10/07/23 1109     Visit Number 3    Date for PT Re-Evaluation 11/12/23    Authorization Type [employer] med[employer]    Authorization Time Period 7/31 - 9/28    Authorization - Visit Number 2    Authorization - Number of Visits 8    PT Start Time 1100    PT Stop Time 1140    PT Time Calculation (min) 40 min    Behavior During Therapy WFL for tasks assessed/performed          Past Medical History:  Diagnosis Date   Anemia    Angio-edema    Anxiety    Bartholin cyst    Cervical cancer (HCC)    Chronic kidney disease    Constipation    Depression    Deviated septum    Diabetes mellitus without complication (HCC)    Difficult intravenous access    used port for 05-09-2019 surgery   Fibroids    History of blood transfusion    2 units given 04-26-2019, has had total of 8 units since jan 2021   History of blood transfusion 1 unit given 05-30-19   iv fluids also given   History of cervical cancer 07/16/2022   History of kidney problems    History of radiation therapy 03/23/2019-05/04/2019   Cervical external beam   Dr Lynwood Nasuti   History of radiation therapy 05/09/2019-06/05/2019   vaginal brachytherapy   Dr Lynwood Nasuti   History of recent blood transfusion 05/16/2019   2 units given  per dr melrose at cancer center   Hypertension    Neuropathy    both thumbs   Obesity    Palpitations    PONV (postoperative nausea and vomiting)    Prediabetes    Preeclampsia 2006   Sleep apnea    no cpap used insurance would not cover, osa severe per pt   SOB (shortness of breath)    Squamous cell carcinoma of cervix (HCC) 03/23/2019   Swallowing difficulty    Symptomatic skin lesion 07/14/2022   Vitamin D  deficiency 01/10/2020   Past Surgical History:  Procedure Laterality Date    CESAREAN SECTION WITH BILATERAL TUBAL LIGATION  11/10/2004       DILATION AND CURETTAGE OF UTERUS  1997   IR IMAGING GUIDED PORT INSERTION  04/04/2019   IR REMOVAL TUN ACCESS W/ PORT W/O FL MOD SED  10/27/2021   OPERATIVE ULTRASOUND N/A 05/09/2019   Procedure: OPERATIVE ULTRASOUND;  Surgeon: Nasuti Lynwood, MD;  Location: [facility];  Service: Urology;  Laterality: N/A;   OPERATIVE ULTRASOUND N/A 05/15/2019   Procedure: OPERATIVE ULTRASOUND;  Surgeon: Nasuti Lynwood, MD;  Location: [facility];  Service: Urology;  Laterality: N/A;   OPERATIVE ULTRASOUND N/A 05/22/2019   Procedure: OPERATIVE ULTRASOUND;  Surgeon: Nasuti Lynwood, MD;  Location: [facility];  Service: Urology;  Laterality: N/A;   OPERATIVE ULTRASOUND N/A 05/29/2019   Procedure: OPERATIVE ULTRASOUND;  Surgeon: Nasuti Lynwood, MD;  Location: [facility];  Service: Urology;  Laterality: N/A;   OPERATIVE ULTRASOUND N/A 06/05/2019   Procedure: OPERATIVE ULTRASOUND;  Surgeon: Nasuti Lynwood, MD;  Location: [facility];  Service: Urology;  Laterality: N/A;   TANDEM RING INSERTION N/A 05/09/2019   Procedure: TANDEM RING INSERTION;  Surgeon:  Shannon Agent, MD;  Location: [facility];  Service: Urology;  Laterality: N/A;   TANDEM RING INSERTION N/A 05/15/2019   Procedure: TANDEM RING INSERTION;  Surgeon: Shannon Agent, MD;  Location: [facility];  Service: Urology;  Laterality: N/A;   TANDEM RING INSERTION N/A 05/22/2019   Procedure: TANDEM RING INSERTION;  Surgeon: Shannon Agent, MD;  Location: [facility];  Service: Urology;  Laterality: N/A;   TANDEM RING INSERTION N/A 05/29/2019   Procedure: TANDEM RING INSERTION;  Surgeon: Shannon Agent, MD;  Location: [facility];  Service: Urology;  Laterality: N/A;   TANDEM RING INSERTION N/A 06/05/2019   Procedure: TANDEM RING INSERTION;  Surgeon: Shannon Agent, MD;  Location: [facility];  Service: Urology;  Laterality: N/A;   Patient Active Problem List   Diagnosis Date Noted   Hepatic steatosis 08/15/2023   Morbid obesity (HCC) 07/10/2023   History of radiation therapy 05/21/2023   Difficult intravenous access 05/21/2023   HSV-2 infection 07/13/2022   Dyspareunia in female 10/23/2021   Hyperlipidemia associated with type 2 diabetes mellitus (HCC) 07/03/2021   Type 2 diabetes mellitus with stage 3b chronic kidney disease, without long-term current use of insulin  (HCC) 11/15/2020   OSA (obstructive sleep apnea) 07/31/2020   Other hyperlipidemia 01/10/2020   Insulin  resistance 01/10/2020   Class 2 severe obesity with serious comorbidity and body mass index (BMI) of 39.0 to 39.9 in adult [facility]) 01/10/2020   Peripheral 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) 04/25/2019   CKD (chronic kidney disease), symptom management only, stage 3 (moderate) (HCC) 03/30/2019   Generalized anxiety disorder 03/30/2019   Hypertension associated with type 2 diabetes mellitus (HCC)    Squamous cell carcinoma of cervix (HCC) 03/23/2019   Deficiency anemia 03/22/2019    PCP: n/a  REFERRING PROVIDER: Knute Thersia Bitters, FNP   REFERRING DIAG: M25.50,G89.29 (ICD-10-CM) - Chronic joint pain   Rationale for Evaluation and Treatment: Rehabilitation  THERAPY DIAG:  Bilateral foot pain  Numbness and tingling of right lower extremity  Muscle weakness (generalized)  ONSET DATE: x 1 year  SUBJECTIVE:                                                                                                                                                                                           SUBJECTIVE STATEMENT: Pt reports good tolerance to first aquatic therapy session.   POOL ACCESS:  Applied for scholarship for [employer] should hear in the next week.  Free access to 1 hr cancer group water class at [facility] on Saturdays.   Initial Subjective My left foot has  a lot of pain.   Had a cortizone shot and it is better.  Since cancer treatments in 2021 I have a lot of neuropathies in my lower legs and feet.  I also have numbness in right knee through hip after standing for up to 10 minutes.  It happens everyday.  My joints hurt every day hips and knees.  Cancer doctors says my leg/and joint pain is unrelated to the cancer. Pain greater in am when first getting up.  PERTINENT HISTORY:  hx of hypertension, preeclampsia, hyperlipidemia, OSA, diabetes, squamous cell carcinoma of cervix s/p radiation and chemotherapy, morbid obesity. Has had 2 other episodes of aquatic intervention since 2022  PAIN:  Are you having pain? Yes ( no pain/ just numbness) : NPRS scale: current normal 4/10; Pain location: RLE to foot Pain description: numbness tingling Aggravating factors: standing >10 mins Relieving factors: sitting x 10 minutes  PRECAUTIONS: None   RED FLAGS: None      WEIGHT BEARING RESTRICTIONS: No   FALLS:  Has patient fallen in last 6 months? No   LIVING ENVIRONMENT: Lives with: lives with their partner Lives in: House/apartment Stairs: split foyer, 6 steps up/down, bilat HR Has following equipment at home: None   PLOF: Independent   OCCUPATION: not working  PLOF: Independent  PATIENT GOALS: get better, find out what's going on  in my back  NEXT MD VISIT: weekly  OBJECTIVE:  Note: Objective measures were completed at Evaluation unless otherwise noted.  DIAGNOSTIC FINDINGS:  N/a As per pt Oa in left foot  PATIENT SURVEYS:  LEFS  Extreme difficulty/unable (0), Quite a bit of difficulty (1), Moderate difficulty (2), Little difficulty (3), No difficulty (4) Survey date:    Any of your usual work, housework or school activities 2  2. Usual hobbies, recreational or sporting activities 1  3. Getting into/out of the bath 2  4. Walking between rooms 3  5. Putting on socks/shoes 1  6. Squatting  2  7. Lifting an object, like a bag of groceries from  the floor 3  8. Performing light activities around your home 3  9. Performing heavy activities around your home 1  10. Getting into/out of a car 2  11. Walking 2 blocks 1  12. Walking 1 mile 0  13. Going up/down 10 stairs (1 flight) 1  14. Standing for 1 hour 0  15.  sitting for 1 hour 0  16. Running on even ground 0  17. Running on uneven ground 0  18. Making sharp turns while running fast 0  19. Hopping  0  20. Rolling over in bed 2  Score total:  24/80     COGNITION: Overall cognitive status: Within functional limits for tasks assessed     SENSATION: Peripheral neuropathies bilat feet  MUSCLE LENGTH: Hamstrings: right hamstring tightness> left   POSTURE: rounded shoulders, forward head, and decreased lumbar lordosis   PALPATION: No TTP  LUMBAR ROM:   wfl  LOWER EXTREMITY ROM:     Full ROM in bilateral hips. Tightness in the hip adductors and right hamstring  LOWER EXTREMITY strength:    HD lbs Right eval Left eval  Hip flexion 35.2 50.0  Hip extension    Hip abduction 21.2 30.9  Hip adduction    Hip internal rotation    Hip external rotation    Knee flexion    Knee extension 20.8 26.3  Ankle dorsiflexion    Ankle plantarflexion    Ankle inversion  Ankle eversion     (Blank rows = not tested)   FUNCTIONAL TESTS:  5 times sit to stand: 18.79 Timed up and go (TUG): 14.94s   4 stage balance: passed 1&2. Tandem stance small steps to gain position hold x 8s; SLS x6 step GAIT: Distance walked: 500 ft Assistive device utilized: None Level of assistance: Complete Independence Comments: initial gait slightly antalgic.  TREATMENT  [facility] Adult PT Treatment:                                                DATE:10/07/23 Pt seen for aquatic therapy today.  Treatment took place in water 3.5-4.75 ft in depth at the [facility] pool. Temp of water was 91.  Pt entered/exited the pool via stairs using step to pattern with hand rail.  *walking  forward/backward unsupported -> with UE support on rainbow hand floats and focus on heel/toe and toe/heel back and allowing Lt knee to flex during swing through *UE support on rainbow hand floats:  side stepping with increased step height; marching forward/ backward *UE support on wall: heel raises x 15; hip add/abdct x 10; hamstring curls x 10 each LE; Hip extension x 10 each LE * forward walking small kicks - no UE support *Lateral step ups leading R/L x 10 unilateral UE support hand rail * return to walking unsupported forward/ backward  *bil calf stretch with heels off of step   Pt requires the buoyancy and hydrostatic pressure of water for support, and to offload joints by unweighting joint load by at least 50 % in navel deep water and by at least 75-80% in chest to neck deep water.  Viscosity of the water is needed for resistance of strengthening. Water current perturbations provides challenge to standing balance requiring increased core activation.                                                                                                                                  PATIENT EDUCATION:  Education details: reacquainting with aquatic therapy  Person educated: Patient Education method: Explanation Education comprehension: verbalized understanding  HOME EXERCISE PROGRAM: [id number] from previous aquatic episdoe  ASSESSMENT:  CLINICAL IMPRESSION: Pt required some cues to allow knees to flex during swing through phase of gait.  No increase in pain during session. Needs minor cues for counting.  She remains a good candidate for aquatic intervention and will benefit from the properties of water to progress towards functional goals. Pt has met STG 1 and is making progress towards remaining goals.      Initial Impression Patient is a 48 y.o. f who was seen today for physical therapy evaluation and treatment for Chronic joint pain . She is well known to [facility] clinics as well as  aquatic therapy finishing an episode  of pelvic floor in April 2024 and in aquatics 1 year ago for similar dx.  She reports she has been having constant numbness, tingling  in right anterior thigh in past year daily that occurs with standing >10-15 minutes.  She has constant feet pain and toe numbness due to cancer treatments. No falls.  Testing demonstrates rle stronger than left.  She reports she is limited with functional amb due to LE pain. She has aquatic HEP from previous episode. Plan to see her in aquatics and re establish as well as modify/add to program for indep completion.  OBJECTIVE IMPAIRMENTS: Abnormal gait, decreased activity tolerance, decreased balance, difficulty walking, decreased strength, and impaired sensation.    ACTIVITY LIMITATIONS: standing, stairs, and locomotion level   PARTICIPATION LIMITATIONS: cleaning and community activity   PERSONAL FACTORS: Time since onset of injury/illness/exacerbation and 1-2 comorbidities: PMH are also affecting patient's functional outcome.    REHAB POTENTIAL: Good   CLINICAL DECISION MAKING: Evolving/moderate complexity   EVALUATION COMPLEXITY: Moderate   GOALS: Goals reviewed with patient? Yes  SHORT TERM GOALS: Target date: 10/01/23  Pt will tolerate full aquatic sessions consistently without increase in pain and with improving function to demonstrate good toleration and effectiveness of intervention.  Baseline: Goal status: MET - 10/07/23  2.  Pt will report gaining pool access Baseline:  Goal status: Partially met -10/07/23    LONG TERM GOALS: Target date: 11/12/23  Pt to improve on LEFS by at least 9 point to demonstrate statistically significant Improvement in function. Baseline: 24/80 Goal status: INITIAL  2.  Pt will be indep with aquatic final HEP for continued management of condition Baseline:  Goal status: INITIAL  3.  Pt will report decrease in right le numbness and tingling by 50% Baseline:  Goal status:  INITIAL  4.  Pt will report walking dog up to 3 x week to and from mailbox without SOB or excessive pain. Baseline:  Goal status: INITIAL    PLAN:  PT FREQUENCY: 1x/week  PT DURATION: 10 weeks will be delayed 4 weeks due to scheduling conflicts. ^ visit only likely  PLANNED INTERVENTIONS: 97164- PT Re-evaluation, 97750- Physical Performance Testing, 97110-Therapeutic exercises, 97530- Therapeutic activity, [id number]- Neuromuscular re-education, 97535- Self Care, [id number]- Manual therapy, [phone]- Gait training, [phone]- Orthotic Initial, [phone]- Aquatic Therapy, [phone] (1-2 muscles), [id number] (3+ muscles)- Dry Needling, Patient/Family education, Balance training, Stair training, Taping, Joint mobilization, DME instructions, Cryotherapy, and Moist heat.  PLAN FOR NEXT SESSION: Aquatics for le and core strengthening, balance retraining and pain management. HEP  Delon Aquas, PTA 10/07/23 11:55 AM [facility] [address] [place], [place], [address] Phone: [phone]   Fax:  [phone]   For all possible CPT codes, reference the Planned Interventions line above.     Check all conditions that are expected to impact treatment: {Conditions expected to impact treatment:Morbid obesity, Diabetes mellitus, and Musculoskeletal disorders   If treatment provided at initial evaluation, no treatment charged due to lack of authorization.

## 2023-10-09 ENCOUNTER — Encounter: Payer: Self-pay | Admitting: Gastroenterology

## 2023-10-09 DIAGNOSIS — K760 Fatty (change of) liver, not elsewhere classified: Secondary | ICD-10-CM | POA: Insufficient documentation

## 2023-10-09 DIAGNOSIS — Z1211 Encounter for screening for malignant neoplasm of colon: Secondary | ICD-10-CM | POA: Insufficient documentation

## 2023-10-09 DIAGNOSIS — K862 Cyst of pancreas: Secondary | ICD-10-CM | POA: Insufficient documentation

## 2023-10-09 DIAGNOSIS — K5790 Diverticulosis of intestine, part unspecified, without perforation or abscess without bleeding: Secondary | ICD-10-CM | POA: Insufficient documentation

## 2023-10-11 ENCOUNTER — Ambulatory Visit: Payer: Self-pay | Admitting: Genetic Counselor

## 2023-10-11 ENCOUNTER — Telehealth: Payer: Self-pay | Admitting: Genetic Counselor

## 2023-10-11 DIAGNOSIS — Z1379 Encounter for other screening for genetic and chromosomal anomalies: Secondary | ICD-10-CM

## 2023-10-11 HISTORY — DX: Encounter for other screening for genetic and chromosomal anomalies: Z13.79

## 2023-10-11 NOTE — Telephone Encounter (Signed)
 I spoke to Tamara Osborne to review results of genetic testing. she had genetic testing with [facility]-Expanded +RNAinsight panel. Testing did not identify any variants known to increase the risk for cancer.  Discussed that we do not know why she has had cervical cancer or why there is cancer in the family. It could be due to a different gene that we are not testing, or maybe our current technology may not be able to pick something up.  It will be important for her to keep in contact with genetics to keep up with whether additional testing may be needed.  Please see counseling note for further detail on this result.

## 2023-10-11 NOTE — Progress Notes (Signed)
 HPI:  Tamara Osborne was previously seen in the [facility] due to a personal and family history of cancer and concerns regarding a hereditary predisposition to cancer. Please refer to our prior cancer genetics clinic note for more information regarding our discussion, assessment and recommendations, at the time. Tamara Osborne recent genetic test results were disclosed to her, as were recommendations warranted by these results. These results and recommendations are discussed in more detail below. Results disclosed by phone 10/11/23.   CANCER HISTORY:  Oncology History  Squamous cell carcinoma of cervix (HCC)  03/21/2019 Pathology Results   The biopsy is superficial and therefore depth of invasion cannot be  determined.  There is at least carcinoma in-situ.    03/23/2019 Initial Diagnosis   Squamous cell carcinoma of cervix (HCC)   03/23/2019 Pathology Results   CERVIX, 11 O CLOCK, BIOPSY:  -  Squamous cell carcinoma, invasive  -  See comment   03/23/2019 - 06/05/2019 Radiation Therapy   External beam therapy was from 03/23/2019 - 05/04/2019. HDR vaginal brachytherapy was from 05/19/2019 - 06/05/2019.   Site Technique Total Dose (Gy) Dose per Fx (Gy) Completed Fx Beam Energies  Cervix: Cervix HDR-brachy 5.5/5.5 5.5 1/1 Ir-192  Cervix: Cervix HDR-brachy 5.5/5.5 5.5 1/1 Ir-192  Cervix: Cervix_Bst HDR-brachy 5.5/5.5 5.5 1/1 Ir-192  Cervix: Cervix HDR-brachy 5.5/5.5 5.5 1/1 Ir-192  Cervix: Cervix 3D 45/45 1.8 25/25 10X, 15X  Cervix: Cervix HDR-brachy 5.5/5.5 5.5 1/1 Ir-192  Cervix: Cervix_Bst 3D 9/9 1.8 5/5 15X      03/28/2019 PET scan   1. Hypermetabolic cervical mass measures roughly 7.6 by 6.9 by 4.9 cm, compatible with malignancy. No adenopathy or distant metastatic spread identified. 2. Extending cephalad and anterior to the uterine fundus, there is a 450 cubic cm simple fluid density structure without hypermetabolic activity. This may represent a right adnexal cyst  (favored), peritoneal inclusion cyst, or (if the patient has a remote history of pancreatitis) a remote pseudocyst. 3. Faint ground density nodularity in both lower lobes which could be from atypical infection or extrinsic allergic alveolitis. Given nodular appearance of some of this ground-glass density, I would recommend follow up chest CT in 3 months time in order to ensure clearance. 4. Moderate cardiomegaly, cause uncertain. The patient also has advanced for age/gender atherosclerosis. Aortic Atherosclerosis (ICD10-I70.0).     04/04/2019 Procedure   Successful placement of a right internal jugular approach power injectable Port-A-Cath. The catheter is ready for immediate use   04/07/2019 - 05/12/2019 Chemotherapy   The patient had weekly cisplatin  for chemotherapy treatment.     09/06/2019 PET scan   1. No evidence residual hypermetabolic cervical tissue. 2. No evidence of metastatic cervical carcinoma. 3. Large benign cystic mass unchanged in the upper pelvis.         FAMILY HISTORY:  We obtained a detailed, 4-generation family history.  Significant diagnoses are listed below: Family History  Problem Relation Age of Onset   Brain cancer Mother 6   Hypertension Mother    Depression Mother    Anxiety disorder Mother    Lung cancer Mother 41   Diabetes Father    Kidney disease Father    Kidney cancer Father 57   Heart attack Sister    Cervical cancer Sister 11   Heart failure Paternal Aunt    Heart attack Paternal Uncle    Cancer Maternal Grandmother        possibly lung cancer?   Kidney failure Maternal Grandfather  Leukemia Paternal Grandmother 31   Heart attack Paternal Grandfather    Brain cancer Paternal Cousin 55   Cervical cancer Maternal Cousin    Colon cancer Neg Hx    Esophageal cancer Neg Hx    Inflammatory bowel disease Neg Hx    Liver disease Neg Hx    Pancreatic cancer Neg Hx    Rectal cancer Neg Hx    Stomach cancer Neg Hx     Tamara Osborne is unaware  of previous family history of genetic testing for hereditary cancer risks. There is no reported Ashkenazi Jewish ancestry.      GENETIC TEST RESULTS: Genetic testing reported out on 10/10/23 through the [facility]Next-Expanded +RNAinsight cancer panel found no pathogenic mutations. The CancerNext-Expanded gene panel offered by [facility] and includes sequencing, rearrangement, and RNA analysis for the following 77 genes: AIP, ALK, APC, ATM, AXIN2, BAP1, BARD1, BMPR1A, BRCA1, BRCA2, BRIP1, CDC73, CDH1, CDK4, CDKN1B, CDKN2A, CEBPA, CHEK2, CTNNA1, DDX41, DICER1, EGFR, EPCAM, ETV6, FH, FLCN, GATA2, GREM1, HOXB13, KIT, LZTR1, MAX, MBD4, MEN1, MET, MITF, MLH1, MSH2, MSH3, MSH6, MUTYH, NF1, NF2, NTHL1, PALB2, PDGFRA, PHOX2B, PMS2, POLD1, POLE, POT1, PRKAR1A, PTCH1, PTEN, RAD51C, RAD51D, RB1, RET, RPS20, RUNX1, SDHA, SDHAF2, SDHB, SDHC, SDHD, SMAD4, SMARCA4, SMARCB1, SMARCE1, STK11, SUFU, TMEM127, TP53, TSC1, TSC2, VHL, WT1. The test report has been scanned into EPIC and is located under the Molecular Pathology section of the Results Review tab.  A portion of the result report is included below for reference.     We discussed with Tamara Osborne that because current genetic testing is not perfect, it is possible there may be a gene mutation in one of these genes that current testing cannot detect, but that chance is small.  We also discussed, that there could be another gene that has not yet been discovered, or that we have not yet tested, that is responsible for the cancer diagnoses in the family. It is also possible there is a hereditary cause for the cancer in the family that Tamara Osborne did not inherit and therefore was not identified in her testing.  Therefore, it is important to remain in touch with cancer genetics in the future so that we can continue to offer Tamara Osborne the most up to date genetic testing.   ADDITIONAL GENETIC TESTING: We discussed with Tamara Osborne that her genetic testing was fairly  extensive.  If there are genes identified to increase cancer risk that can be analyzed in the future, we would be happy to discuss and coordinate this testing at that time.    CANCER SCREENING RECOMMENDATIONS: Tamara Osborne test result is considered negative (normal).  This means that we have not identified a hereditary cause for her personal and family history of cancer at this time. Most cancers happen by chance and this negative test suggests that her personal and family history of cancer may fall into this category.    Possible reasons for Tamara Osborne's negative genetic test include:  1. There may be a gene mutation in one of these genes that current testing methods cannot detect but that chance is small.  2. There could be another gene that has not yet been discovered, or that we have not yet tested, that is responsible for the cancer diagnoses in the family.  3.  There may be no hereditary risk for cancer in the family. The cancers in Tamara Osborne and/or her family may be sporadic/familial or due to other genetic and environmental factors. 4. It is also  possible there is a hereditary cause for the cancer in the family that Tamara Osborne did not inherit.  Therefore, it is recommended she continue to follow the cancer management and screening guidelines provided by her oncology and primary healthcare provider. An individual's cancer risk and medical management are not determined by genetic test results alone. Overall cancer risk assessment incorporates additional factors, including personal medical history, family history, and any available genetic information that may result in a personalized plan for cancer prevention and surveillance  An individual's cancer risk and medical management are not determined by genetic test results alone. Overall cancer risk assessment incorporates additional factors, including personal medical history, family history, and any available genetic information that may result  in a personalized plan for cancer prevention and surveillance.  RECOMMENDATIONS FOR FAMILY MEMBERS:  Individuals in this family might be at some increased risk of developing cancer, over the general population risk, simply due to the family history of cancer.  We recommended women in this family have a yearly mammogram beginning at age 33, or 58 years younger than the earliest onset of cancer, an annual clinical breast exam, and perform monthly breast self-exams. Women in this family should also have a gynecological exam as recommended by their primary provider. All family members should be referred for colonoscopy starting at age 66, or 3 years younger than the earliest onset of cancer.  FOLLOW-UP: Lastly, we discussed with Tamara Osborne that cancer genetics is a rapidly advancing field and it is possible that new genetic tests will be appropriate for her and/or her family members in the future. We encouraged her to remain in contact with cancer genetics on an annual basis so we can update her personal and family histories and let her know of advances in cancer genetics that may benefit this family.   Our contact number was provided. Tamara Osborne questions were answered to her satisfaction, and she knows she is welcome to call us  at anytime with additional questions or concerns.   Burnard Ogren, MS, [facility] Licensed, Retail [email or web address]

## 2023-10-11 NOTE — Telephone Encounter (Signed)
 LVM asking for call back to review results of genetic testing.

## 2023-10-12 ENCOUNTER — Ambulatory Visit (INDEPENDENT_AMBULATORY_CARE_PROVIDER_SITE_OTHER): Admitting: Family Medicine

## 2023-10-14 ENCOUNTER — Ambulatory Visit (HOSPITAL_BASED_OUTPATIENT_CLINIC_OR_DEPARTMENT_OTHER): Admitting: Physical Therapy

## 2023-10-19 ENCOUNTER — Ambulatory Visit (INDEPENDENT_AMBULATORY_CARE_PROVIDER_SITE_OTHER): Admitting: Family Medicine

## 2023-10-19 ENCOUNTER — Encounter (INDEPENDENT_AMBULATORY_CARE_PROVIDER_SITE_OTHER): Payer: Self-pay | Admitting: Family Medicine

## 2023-10-19 VITALS — BP 133/81 | HR 87 | Temp 98.1°F | Ht 61.0 in | Wt 230.0 lb

## 2023-10-19 DIAGNOSIS — N1832 Chronic kidney disease, stage 3b: Secondary | ICD-10-CM | POA: Diagnosis not present

## 2023-10-19 DIAGNOSIS — Z6841 Body Mass Index (BMI) 40.0 and over, adult: Secondary | ICD-10-CM

## 2023-10-19 DIAGNOSIS — Z7985 Long-term (current) use of injectable non-insulin antidiabetic drugs: Secondary | ICD-10-CM

## 2023-10-19 DIAGNOSIS — E1122 Type 2 diabetes mellitus with diabetic chronic kidney disease: Secondary | ICD-10-CM | POA: Diagnosis not present

## 2023-10-19 MED ORDER — SEMAGLUTIDE (2 MG/DOSE) 8 MG/3ML ~~LOC~~ SOPN: 2.0000 mg | PEN_INJECTOR | SUBCUTANEOUS | 0 refills | Status: DC

## 2023-10-19 NOTE — Progress Notes (Unsigned)
   SUBJECTIVE:  Chief Complaint: Obesity  Interim History: Since last appointment patient has been going back to aqua therapy and doing pool exercises at least once a week.  She is hoping to get up to 3 times a week.  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  She has a colonoscopy scheduled and saw gastro for pancreatic cyst.  She has upcoming appointment with urology.  She has been taking pictures of her food intake since last appointment. She has also been logging her intake. She is staying below 1600 calories and getting close to 90 grams of protein.  Feels she has done really well getting her protein and calories in.  Tamara Osborne is here to discuss her progress with her obesity treatment plan. She is on the keeping a food journal and adhering to recommended goals of 1400-1500 calories and 95 grams of protein and states she is following her eating plan approximately 90 % of the time. She states she is exercising 60 minutes 2 times per week.   OBJECTIVE: Visit Diagnoses: Problem List Items Addressed This Visit   None   Vitals Temp: 98.1 F (36.7 C) BP: 133/81 Pulse Rate: 87 SpO2: 98 %   Anthropometric Measurements Height: 5' 1 (1.549 m) Weight: 230 lb (104.3 kg) BMI (Calculated): 43.48 Weight at Last Visit: 228 lb Weight Lost Since Last Visit: 0 Weight Gained Since Last Visit: 2 Starting Weight: 208 lb Total Weight Loss (lbs): 0 lb (0 kg)   Body Composition  Body Fat %: 48.5 % Fat Mass (lbs): 111.6 lbs Muscle Mass (lbs): 112.6 lbs Total Body Water (lbs): 87.4 lbs Visceral Fat Rating : 15   Other Clinical Data Today's Visit #: 87 Starting Date: 09/20/19 Comments: 1400-1500/95     ASSESSMENT AND PLAN: Assessment & Plan Type 2 diabetes mellitus with stage 3b chronic kidney disease, without long-term current use of insulin  (HCC) Tolerating Ozempic  but will increase dose today as patient is still sometimes going over calories.  New dose of Ozempic  sent to pharmacy and  will need to follow-up on any side effects patient may have at next appointment. Morbid obesity (HCC)  BMI 40.0-44.9, adult (HCC), Current BMI 43.46    Diet: Tamara Osborne is currently in the action stage of change. As such, her goal is to continue with weight loss efforts and has agreed to keeping a food journal and adhering to recommended goals of 1600 calories and 95 or more grams of protein.   Exercise:  For substantial health benefits, adults should do at least 150 minutes (2 hours and 30 minutes) a week of moderate-intensity, or 75 minutes (1 hour and 15 minutes) a week of vigorous-intensity aerobic physical activity, or an equivalent combination of moderate- and vigorous-intensity aerobic activity. Aerobic activity should be performed in episodes of at least 10 minutes, and preferably, it should be spread throughout the week.  Behavior Modification:  We discussed the following Behavioral Modification Strategies today: increasing lean protein intake, decreasing simple carbohydrates, increasing vegetables, meal planning and cooking strategies, and keep a strict food journal.  Follow-up in 4 weeks.   She was informed of the importance of frequent follow up visits to maximize her success with intensive lifestyle modifications for her multiple health conditions.  Attestation Statements:   Reviewed by clinician on day of visit: allergies, medications, problem list, medical history, surgical history, family history, social history, and previous encounter notes.     Tamara Cho, MD

## 2023-10-20 NOTE — Assessment & Plan Note (Signed)
 Tolerating Ozempic  but will increase dose today as patient is still sometimes going over calories.  New dose of Ozempic  sent to pharmacy and will need to follow-up on any side effects patient may have at next appointment.

## 2023-10-21 ENCOUNTER — Ambulatory Visit (HOSPITAL_BASED_OUTPATIENT_CLINIC_OR_DEPARTMENT_OTHER): Admitting: Physical Therapy

## 2023-10-21 ENCOUNTER — Encounter (HOSPITAL_BASED_OUTPATIENT_CLINIC_OR_DEPARTMENT_OTHER): Payer: Self-pay | Admitting: Physical Therapy

## 2023-10-21 DIAGNOSIS — M79671 Pain in right foot: Secondary | ICD-10-CM

## 2023-10-21 DIAGNOSIS — M6281 Muscle weakness (generalized): Secondary | ICD-10-CM

## 2023-10-21 DIAGNOSIS — R200 Anesthesia of skin: Secondary | ICD-10-CM

## 2023-10-21 NOTE — Therapy (Signed)
 OUTPATIENT PHYSICAL THERAPY THORACOLUMBAR TREATMENT   Patient Name: Tamara Osborne MRN: [id number] DOB:17-Apr-1975, 48 y.o., female Today's Date: 10/21/2023  END OF SESSION:  PT End of Session - 10/21/23 1110     Visit Number 4    Date for PT Re-Evaluation 11/12/23    Authorization Type [employer]    Authorization Time Period 7/31 - 9/28    Authorization - Visit Number 3    Authorization - Number of Visits 8    PT Start Time 1100    PT Stop Time 1140    PT Time Calculation (min) 40 min    Activity Tolerance Patient tolerated treatment well    Behavior During Therapy WFL for tasks assessed/performed           Past Medical History:  Diagnosis Date   Anemia    Angio-edema    Anxiety    Bartholin cyst    Cervical cancer (HCC)    Chronic kidney disease    Constipation    Depression    Deviated septum    Diabetes mellitus without complication (HCC)    Difficult intravenous access    used port for 05-09-2019 surgery   Fibroids    History of blood transfusion    2 units given 04-26-2019, has had total of 8 units since jan 2021   History of blood transfusion 1 unit given 05-30-19   iv fluids also given   History of cervical cancer 07/16/2022   History of kidney problems    History of radiation therapy 03/23/2019-05/04/2019   Cervical external beam   Dr Lynwood Nasuti   History of radiation therapy 05/09/2019-06/05/2019   vaginal brachytherapy   Dr Lynwood Nasuti   History of recent blood transfusion 05/16/2019   2 units given  per dr melrose at cancer center   Hypertension    Neuropathy    both thumbs   Obesity    Palpitations    PONV (postoperative nausea and vomiting)    Prediabetes    Preeclampsia 2006   Sleep apnea    no cpap used insurance would not cover, osa severe per pt   SOB (shortness of breath)    Squamous cell carcinoma of cervix (HCC) 03/23/2019   Swallowing difficulty    Symptomatic skin lesion 07/14/2022   Vitamin D  deficiency 01/10/2020    Past Surgical History:  Procedure Laterality Date   CESAREAN SECTION WITH BILATERAL TUBAL LIGATION  11/10/2004       DILATION AND CURETTAGE OF UTERUS  1997   IR IMAGING GUIDED PORT INSERTION  04/04/2019   IR REMOVAL TUN ACCESS W/ PORT W/O FL MOD SED  10/27/2021   OPERATIVE ULTRASOUND N/A 05/09/2019   Procedure: OPERATIVE ULTRASOUND;  Surgeon: Nasuti Lynwood, MD;  Location: [facility];  Service: Urology;  Laterality: N/A;   OPERATIVE ULTRASOUND N/A 05/15/2019   Procedure: OPERATIVE ULTRASOUND;  Surgeon: Nasuti Lynwood, MD;  Location: [facility];  Service: Urology;  Laterality: N/A;   OPERATIVE ULTRASOUND N/A 05/22/2019   Procedure: OPERATIVE ULTRASOUND;  Surgeon: Nasuti Lynwood, MD;  Location: [facility];  Service: Urology;  Laterality: N/A;   OPERATIVE ULTRASOUND N/A 05/29/2019   Procedure: OPERATIVE ULTRASOUND;  Surgeon: Nasuti Lynwood, MD;  Location: [facility];  Service: Urology;  Laterality: N/A;   OPERATIVE ULTRASOUND N/A 06/05/2019   Procedure: OPERATIVE ULTRASOUND;  Surgeon: Nasuti Lynwood, MD;  Location: [facility];  Service: Urology;  Laterality: N/A;   TANDEM RING INSERTION  N/A 05/09/2019   Procedure: TANDEM RING INSERTION;  Surgeon: Shannon Agent, MD;  Location: [facility];  Service: Urology;  Laterality: N/A;   TANDEM RING INSERTION N/A 05/15/2019   Procedure: TANDEM RING INSERTION;  Surgeon: Shannon Agent, MD;  Location: [facility];  Service: Urology;  Laterality: N/A;   TANDEM RING INSERTION N/A 05/22/2019   Procedure: TANDEM RING INSERTION;  Surgeon: Shannon Agent, MD;  Location: [facility];  Service: Urology;  Laterality: N/A;   TANDEM RING INSERTION N/A 05/29/2019   Procedure: TANDEM RING INSERTION;  Surgeon: Shannon Agent, MD;  Location: [facility];  Service: Urology;  Laterality: N/A;   TANDEM RING INSERTION N/A 06/05/2019   Procedure: TANDEM RING  INSERTION;  Surgeon: Shannon Agent, MD;  Location: [facility];  Service: Urology;  Laterality: N/A;   Patient Active Problem List   Diagnosis Date Noted   Genetic testing 10/11/2023   Pancreatic cyst 10/09/2023   Colon cancer screening 10/09/2023   Fatty liver 10/09/2023   Diverticulosis 10/09/2023   Hepatic steatosis 08/15/2023   Morbid obesity (HCC) 07/10/2023   History of radiation therapy 05/21/2023   Difficult intravenous access 05/21/2023   HSV-2 infection 07/13/2022   Dyspareunia in female 10/23/2021   Hyperlipidemia associated with type 2 diabetes mellitus (HCC) 07/03/2021   Type 2 diabetes mellitus with stage 3b chronic kidney disease, without long-term current use of insulin  (HCC) 11/15/2020   OSA (obstructive sleep apnea) 07/31/2020   Other hyperlipidemia 01/10/2020   Insulin  resistance 01/10/2020   Class 2 severe obesity with serious comorbidity and body mass index (BMI) of 39.0 to 39.9 in adult [facility]) 01/10/2020   Peripheral 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) 04/25/2019   CKD (chronic kidney disease), symptom management only, stage 3 (moderate) (HCC) 03/30/2019   Generalized anxiety disorder 03/30/2019   Hypertension associated with type 2 diabetes mellitus (HCC)    Squamous cell carcinoma of cervix (HCC) 03/23/2019   Deficiency anemia 03/22/2019    PCP: n/a  REFERRING PROVIDER: Knute Thersia Bitters, FNP   REFERRING DIAG: M25.50,G89.29 (ICD-10-CM) - Chronic joint pain   Rationale for Evaluation and Treatment: Rehabilitation  THERAPY DIAG:  Bilateral foot pain  Numbness and tingling of right lower extremity  Muscle weakness (generalized)  ONSET DATE: x 1 year  SUBJECTIVE:                                                                                                                                                                                           SUBJECTIVE STATEMENT: Left foot is a little swollen and kinda prickly 5/10.  Did  get  the scholarship for [facility].  Going today to pay and get the membership  POOL ACCESS:  Applied for scholarship for [employer] should hear in the next week.  Free access to 1 hr cancer group water class at [facility] on Saturdays.   Initial Subjective My left foot has a lot of pain.  Had a cortizone shot and it is better.  Since cancer treatments in 2021 I have a lot of neuropathies in my lower legs and feet.  I also have numbness in right knee through hip after standing for up to 10 minutes.  It happens everyday.  My joints hurt every day hips and knees.  Cancer doctors says my leg/and joint pain is unrelated to the cancer. Pain greater in am when first getting up.  PERTINENT HISTORY:  hx of hypertension, preeclampsia, hyperlipidemia, OSA, diabetes, squamous cell carcinoma of cervix s/p radiation and chemotherapy, morbid obesity. Has had 2 other episodes of aquatic intervention since 2022  PAIN:  Are you having pain? Yes ( no pain/ just numbness) : NPRS scale: current normal 4/10; Pain location: RLE to foot Pain description: numbness tingling Aggravating factors: standing >10 mins Relieving factors: sitting x 10 minutes  PRECAUTIONS: None   RED FLAGS: None      WEIGHT BEARING RESTRICTIONS: No   FALLS:  Has patient fallen in last 6 months? No   LIVING ENVIRONMENT: Lives with: lives with their partner Lives in: House/apartment Stairs: split foyer, 6 steps up/down, bilat HR Has following equipment at home: None   PLOF: Independent   OCCUPATION: not working  PLOF: Independent  PATIENT GOALS: get better, find out what's going on  in my back  NEXT MD VISIT: weekly  OBJECTIVE:  Note: Objective measures were completed at Evaluation unless otherwise noted.  DIAGNOSTIC FINDINGS:  N/a As per pt Oa in left foot  PATIENT SURVEYS:  LEFS  Extreme difficulty/unable (0), Quite a bit of difficulty (1), Moderate difficulty (2), Little difficulty (3), No difficulty (4) Survey date:     Any of your usual work, housework or school activities 2  2. Usual hobbies, recreational or sporting activities 1  3. Getting into/out of the bath 2  4. Walking between rooms 3  5. Putting on socks/shoes 1  6. Squatting  2  7. Lifting an object, like a bag of groceries from the floor 3  8. Performing light activities around your home 3  9. Performing heavy activities around your home 1  10. Getting into/out of a car 2  11. Walking 2 blocks 1  12. Walking 1 mile 0  13. Going up/down 10 stairs (1 flight) 1  14. Standing for 1 hour 0  15.  sitting for 1 hour 0  16. Running on even ground 0  17. Running on uneven ground 0  18. Making sharp turns while running fast 0  19. Hopping  0  20. Rolling over in bed 2  Score total:  24/80     COGNITION: Overall cognitive status: Within functional limits for tasks assessed     SENSATION: Peripheral neuropathies bilat feet  MUSCLE LENGTH: Hamstrings: right hamstring tightness> left   POSTURE: rounded shoulders, forward head, and decreased lumbar lordosis   PALPATION: No TTP  LUMBAR ROM:   wfl  LOWER EXTREMITY ROM:     Full ROM in bilateral hips. Tightness in the hip adductors and right hamstring  LOWER EXTREMITY strength:    HD lbs Right eval Left eval  Hip flexion 35.2 50.0  Hip extension    Hip abduction 21.2 30.9  Hip adduction    Hip internal rotation    Hip external rotation    Knee flexion    Knee extension 20.8 26.3  Ankle dorsiflexion    Ankle plantarflexion    Ankle inversion    Ankle eversion     (Blank rows = not tested)   FUNCTIONAL TESTS:  5 times sit to stand: 18.79 Timed up and go (TUG): 14.94s   4 stage balance: passed 1&2. Tandem stance small steps to gain position hold x 8s; SLS x6 step GAIT: Distance walked: 500 ft Assistive device utilized: None Level of assistance: Complete Independence Comments: initial gait slightly antalgic.  TREATMENT  [facility] Adult PT Treatment:                                                 DATE:10/21/23 Pt seen for aquatic therapy today.  Treatment took place in water 3.5-4.75 ft in depth at the [facility] pool. Temp of water was 91.  Pt entered/exited the pool via stairs using step to pattern with hand rail.  *walking forward/backward unsupported  *standing ue support on wall: TR; heel raises *toe walking forward then back 1 width->heel walking x 1 width forward and back *Walking with UE support on rainbow hand floats marching with knee kick *walking forward and back cuing for proper heel/toe engagement *UE support on rainbow hand floats ue add/abd side stepping; side stepping with increased step height *UE support on RBHB: hip add/abdct crossing midline x 10; hamstring curls x 10 each LE; Hip flex/extension x 10 each LE; relaxed squat x10 *bil calf stretch with heels off of step *Lateral step ups leading R/L x 10 unilateral UE support hand rail * walking unsupported forward/ backward between exercises for recovery  Pt requires the buoyancy and hydrostatic pressure of water for support, and to offload joints by unweighting joint load by at least 50 % in navel deep water and by at least 75-80% in chest to neck deep water.  Viscosity of the water is needed for resistance of strengthening. Water current perturbations provides challenge to standing balance requiring increased core activation.                                                                                                                                  PATIENT EDUCATION:  Education details: reacquainting with aquatic therapy  Person educated: Patient Education method: Explanation Education comprehension: verbalized understanding  HOME EXERCISE PROGRAM: [id number] from previous aquatic episdoe  ASSESSMENT:  CLINICAL IMPRESSION: Pt reports she was able to walk 1/2 mile with dog last week.  Said she had some SOB but recovered resting for a few minutes.  Feet tolerated  well. Continued cues for knee flex during swing with submerged amb.  Improves with exaggerated march then return to normal pattern. Good toleration to session with progressive balance and  proprioceptive retraining.  Goals ongoing   Initial Impression Patient is a 48 y.o. f who was seen today for physical therapy evaluation and treatment for Chronic joint pain . She is well known to [facility] clinics as well as aquatic therapy finishing an episode of pelvic floor in April 2024 and in aquatics 1 year ago for similar dx.  She reports she has been having constant numbness, tingling  in right anterior thigh in past year daily that occurs with standing >10-15 minutes.  She has constant feet pain and toe numbness due to cancer treatments. No falls.  Testing demonstrates rle stronger than left.  She reports she is limited with functional amb due to LE pain. She has aquatic HEP from previous episode. Plan to see her in aquatics and re establish as well as modify/add to program for indep completion.  OBJECTIVE IMPAIRMENTS: Abnormal gait, decreased activity tolerance, decreased balance, difficulty walking, decreased strength, and impaired sensation.    ACTIVITY LIMITATIONS: standing, stairs, and locomotion level   PARTICIPATION LIMITATIONS: cleaning and community activity   PERSONAL FACTORS: Time since onset of injury/illness/exacerbation and 1-2 comorbidities: PMH are also affecting patient's functional outcome.    REHAB POTENTIAL: Good   CLINICAL DECISION MAKING: Evolving/moderate complexity   EVALUATION COMPLEXITY: Moderate   GOALS: Goals reviewed with patient? Yes  SHORT TERM GOALS: Target date: 10/01/23  Pt will tolerate full aquatic sessions consistently without increase in pain and with improving function to demonstrate good toleration and effectiveness of intervention.  Baseline: Goal status: MET - 10/07/23  2.  Pt will report gaining pool access Baseline:  Goal status: Partially met  -10/07/23    LONG TERM GOALS: Target date: 11/12/23  Pt to improve on LEFS by at least 9 point to demonstrate statistically significant Improvement in function. Baseline: 24/80 Goal status: INITIAL  2.  Pt will be indep with aquatic final HEP for continued management of condition Baseline:  Goal status: INITIAL  3.  Pt will report decrease in right le numbness and tingling by 50% Baseline:  Goal status: INITIAL  4.  Pt will report walking dog up to 3 x week to and from mailbox without SOB or excessive pain. Baseline:  Goal status: INITIAL    PLAN:  PT FREQUENCY: 1x/week  PT DURATION: 10 weeks will be delayed 4 weeks due to scheduling conflicts. ^ visit only likely  PLANNED INTERVENTIONS: 97164- PT Re-evaluation, 97750- Physical Performance Testing, 97110-Therapeutic exercises, 97530- Therapeutic activity, [id number]- Neuromuscular re-education, 97535- Self Care, [id number]- Manual therapy, [phone]- Gait training, [phone]- Orthotic Initial, [phone]- Aquatic Therapy, [phone] (1-2 muscles), 20561 (3+ muscles)- Dry Needling, Patient/Family education, Balance training, Stair training, Taping, Joint mobilization, DME instructions, Cryotherapy, and Moist heat.  PLAN FOR NEXT SESSION: Aquatics for le and core strengthening, balance retraining and pain management. HEP  Ronal Sherwood) Labresha Mellor MPT 10/21/23 11:41 AM [facility] [address] [place], [place], [address] Phone: [phone]   Fax:  [phone]    For all possible CPT codes, reference the Planned Interventions line above.     Check all conditions that are expected to impact treatment: {Conditions expected to impact treatment:Morbid obesity, Diabetes mellitus, and Musculoskeletal disorders   If treatment provided at initial evaluation, no treatment charged due to lack of authorization.

## 2023-10-22 ENCOUNTER — Ambulatory Visit: Admitting: Urology

## 2023-10-22 ENCOUNTER — Encounter: Payer: Self-pay | Admitting: Urology

## 2023-10-22 VITALS — BP 128/84 | HR 98

## 2023-10-22 DIAGNOSIS — Z09 Encounter for follow-up examination after completed treatment for conditions other than malignant neoplasm: Secondary | ICD-10-CM | POA: Diagnosis not present

## 2023-10-22 DIAGNOSIS — Z87440 Personal history of urinary (tract) infections: Secondary | ICD-10-CM

## 2023-10-22 DIAGNOSIS — N390 Urinary tract infection, site not specified: Secondary | ICD-10-CM

## 2023-10-22 DIAGNOSIS — R310 Gross hematuria: Secondary | ICD-10-CM

## 2023-10-22 LAB — URINALYSIS, ROUTINE W REFLEX MICROSCOPIC
Bilirubin, UA: NEGATIVE
Glucose, UA: NEGATIVE
Ketones, UA: NEGATIVE
Leukocytes,UA: NEGATIVE
Nitrite, UA: NEGATIVE
Specific Gravity, UA: 1.02 (ref 1.005–1.030)
Urobilinogen, Ur: 0.2 mg/dL (ref 0.2–1.0)
pH, UA: 6 (ref 5.0–7.5)

## 2023-10-22 LAB — MICROSCOPIC EXAMINATION

## 2023-10-22 MED ORDER — PREMARIN 0.625 MG/GM VA CREA: 1.0000 | TOPICAL_CREAM | VAGINAL | 12 refills | Status: AC

## 2023-10-22 NOTE — Patient Instructions (Signed)

## 2023-10-22 NOTE — Progress Notes (Signed)
 10/22/2023 12:21 PM   Tamara Osborne August 26, 1975 [id number]  Referring provider: Knute Thersia Bitters, FNP [address] [place],  [place] [address]  Followup UTI   HPI: Ms Busk is a 48yo here for followup for gross hematuria and frequent UTI. UA today shows 3-10 RBCs/hpf. No UTis since last visit. She uses premarin  cream every other day. Her atrophic vaginitis has significantly improved since starting the premarin  cream. NO significant LUTS. No pelvic pain.     PMH: Past Medical History:  Diagnosis Date   Anemia    Angio-edema    Anxiety    Bartholin cyst    Cervical cancer (HCC)    Chronic kidney disease    Constipation    Depression    Deviated septum    Diabetes mellitus without complication (HCC)    Difficult intravenous access    used port for 05-09-2019 surgery   Fibroids    History of blood transfusion    2 units given 04-26-2019, has had total of 8 units since jan 2021   History of blood transfusion 1 unit given 05-30-19   iv fluids also given   History of cervical cancer 07/16/2022   History of kidney problems    History of radiation therapy 03/23/2019-05/04/2019   Cervical external beam   Dr Lynwood Nasuti   History of radiation therapy 05/09/2019-06/05/2019   vaginal brachytherapy   Dr Lynwood Nasuti   History of recent blood transfusion 05/16/2019   2 units given  per dr melrose at [facility]   Hypertension    Neuropathy    both thumbs   Obesity    Palpitations    PONV (postoperative nausea and vomiting)    Prediabetes    Preeclampsia 2006   Sleep apnea    no cpap used insurance would not cover, osa severe per pt   SOB (shortness of breath)    Squamous cell carcinoma of cervix (HCC) 03/23/2019   Swallowing difficulty    Symptomatic skin lesion 07/14/2022   Vitamin D  deficiency 01/10/2020    Surgical History: Past Surgical History:  Procedure Laterality Date   CESAREAN SECTION WITH BILATERAL TUBAL LIGATION  11/10/2004        DILATION AND CURETTAGE OF UTERUS  1997   IR IMAGING GUIDED PORT INSERTION  04/04/2019   IR REMOVAL TUN ACCESS W/ PORT W/O FL MOD SED  10/27/2021   OPERATIVE ULTRASOUND N/A 05/09/2019   Procedure: OPERATIVE ULTRASOUND;  Surgeon: Nasuti Lynwood, MD;  Location: [facility];  Service: Urology;  Laterality: N/A;   OPERATIVE ULTRASOUND N/A 05/15/2019   Procedure: OPERATIVE ULTRASOUND;  Surgeon: Nasuti Lynwood, MD;  Location: [facility];  Service: Urology;  Laterality: N/A;   OPERATIVE ULTRASOUND N/A 05/22/2019   Procedure: OPERATIVE ULTRASOUND;  Surgeon: Nasuti Lynwood, MD;  Location: [facility];  Service: Urology;  Laterality: N/A;   OPERATIVE ULTRASOUND N/A 05/29/2019   Procedure: OPERATIVE ULTRASOUND;  Surgeon: Nasuti Lynwood, MD;  Location: [facility];  Service: Urology;  Laterality: N/A;   OPERATIVE ULTRASOUND N/A 06/05/2019   Procedure: OPERATIVE ULTRASOUND;  Surgeon: Nasuti Lynwood, MD;  Location: [facility];  Service: Urology;  Laterality: N/A;   TANDEM RING INSERTION N/A 05/09/2019   Procedure: TANDEM RING INSERTION;  Surgeon: Nasuti Lynwood, MD;  Location: [facility];  Service: Urology;  Laterality: N/A;   TANDEM RING INSERTION N/A 05/15/2019   Procedure: TANDEM RING INSERTION;  Surgeon: Nasuti Lynwood, MD;  Location:  [facility];  Service: Urology;  Laterality: N/A;   TANDEM RING INSERTION N/A 05/22/2019   Procedure: TANDEM RING INSERTION;  Surgeon: Shannon Agent, MD;  Location: [facility];  Service: Urology;  Laterality: N/A;   TANDEM RING INSERTION N/A 05/29/2019   Procedure: TANDEM RING INSERTION;  Surgeon: Shannon Agent, MD;  Location: [facility];  Service: Urology;  Laterality: N/A;   TANDEM RING INSERTION N/A 06/05/2019   Procedure: TANDEM RING INSERTION;  Surgeon: Shannon Agent, MD;  Location: [facility];  Service: Urology;  Laterality: N/A;     Home Medications:  Allergies as of 10/22/2023       Reactions   Bee Venom    Penicillins    Not sure a child allergy    Shellfish Allergy  Swelling   Seafood also any kind   Sulfa Antibiotics    Not sure a child allergy         Medication List        Accurate as of October 22, 2023 12:21 PM. If you have any questions, ask your nurse or doctor.          Accu-Chek Guide test strip Generic drug: glucose blood Use to check blood sugar TID.   Accu-Chek Guide w/Device Kit Use to check blood sugar TID.   Accu-Chek Softclix Lancets lancets Use to check blood sugar TID.   amLODipine -valsartan  10-320 MG tablet Commonly known as: EXFORGE  Take 1 tablet by mouth daily.   azelastine  0.1 % nasal spray Commonly known as: ASTELIN  Place 1 spray into both nostrils 2 (two) times daily as needed. Use in each nostril as directed   buPROPion 150 MG 24 hr tablet Commonly known as: WELLBUTRIN XL Take 150 mg by mouth daily.   chlorhexidine  4 % external liquid Commonly known as: Hibiclens  Apply topically daily as needed.   Cholecalciferol 50 MCG (2000 UT) Tabs Take by mouth.   EPINEPHrine  0.3 mg/0.3 mL Soaj injection Commonly known as: EpiPen  2-Pak Inject 0.3 mg into the muscle as needed for anaphylaxis.   fluticasone  50 MCG/ACT nasal spray Commonly known as: FLONASE  Place 2 sprays into both nostrils daily.   ipratropium 0.06 % nasal spray Commonly known as: ATROVENT  Place 2 sprays into both nostrils 3 (three) times daily as needed for rhinitis (drainage).   levocetirizine 5 MG tablet Commonly known as: XYZAL  Take 1 tablet (5 mg total) by mouth every evening.   mupirocin  ointment 2 % Commonly known as: BACTROBAN  Apply 1 Application topically 2 (two) times daily.   Na Sulfate-K Sulfate-Mg Sulfate concentrate 17.5-3.13-1.6 GM/177ML Soln Commonly known as: Suprep Bowel Prep Kit Take 1 kit (354 mLs total) by mouth as directed. For colonoscopy prep   Premarin  vaginal  cream Generic drug: conjugated estrogens  Place 1 Applicatorful vaginally every other day.   Restasis 0.05 % ophthalmic emulsion Generic drug: cycloSPORINE 1 drop 2 (two) times daily.   Semaglutide  (2 MG/DOSE) 8 MG/3ML Sopn Inject 2 mg as directed once a week.        Allergies:  Allergies  Allergen Reactions   Bee Venom    Penicillins     Not sure a child allergy    Shellfish Allergy  Swelling    Seafood also any kind   Sulfa Antibiotics     Not sure a child allergy     Family History: Family History  Problem Relation Age of Onset   Brain cancer Mother 7   Hypertension Mother    Depression Mother    Anxiety  disorder Mother    Lung cancer Mother 73   Diabetes Father    Kidney disease Father    Kidney cancer Father 66   Heart attack Sister    Cervical cancer Sister 100   Heart failure Paternal Aunt    Heart attack Paternal Uncle    Cancer Maternal Grandmother        possibly lung cancer?   Kidney failure Maternal Grandfather    Leukemia Paternal Grandmother 28   Heart attack Paternal Grandfather    Brain cancer Paternal Cousin 25   Cervical cancer Maternal Cousin    Colon cancer Neg Hx    Esophageal cancer Neg Hx    Inflammatory bowel disease Neg Hx    Liver disease Neg Hx    Pancreatic cancer Neg Hx    Rectal cancer Neg Hx    Stomach cancer Neg Hx     Social History:  reports that she has never smoked. She has never used smokeless tobacco. She reports that she does not drink alcohol and does not use drugs.  ROS: All other review of systems were reviewed and are negative except what is noted above in HPI  Physical Exam: BP 128/84   Pulse 98   LMP  (LMP Unknown)   Constitutional:  Alert and oriented, No acute distress. HEENT: [place] AT, moist mucus membranes.  Trachea midline, no masses. Cardiovascular: No clubbing, cyanosis, or edema. Respiratory: Normal respiratory effort, no increased work of breathing. GI: Abdomen is soft, nontender, nondistended, no  abdominal masses GU: No CVA tenderness.  Lymph: No cervical or inguinal lymphadenopathy. Skin: No rashes, bruises or suspicious lesions. Neurologic: Grossly intact, no focal deficits, moving all 4 extremities. Psychiatric: Normal mood and affect.  Laboratory Data: Lab Results  Component Value Date   WBC 9.3 08/06/2022   HGB 15.3 08/06/2022   HCT 45.3 08/06/2022   MCV 83 08/06/2022   PLT 326 08/06/2022    Lab Results  Component Value Date   CREATININE 1.4 (A) 06/09/2023    No results found for: PSA  No results found for: TESTOSTERONE  Lab Results  Component Value Date   HGBA1C 5.6 08/04/2023    Urinalysis    Component Value Date/Time   COLORURINE STRAW (A) 02/26/2023 1942   APPEARANCEUR Clear 07/21/2023 1306   LABSPEC 1.008 02/26/2023 1942   PHURINE 5.0 02/26/2023 1942   GLUCOSEU Negative 07/21/2023 1306   HGBUR MODERATE (A) 02/26/2023 1942   BILIRUBINUR Negative 07/21/2023 1306   KETONESUR negative 04/23/2023 0924   KETONESUR NEGATIVE 02/26/2023 1942   PROTEINUR 2+ (A) 07/21/2023 1306   PROTEINUR 30 (A) 02/26/2023 1942   UROBILINOGEN 0.2 04/23/2023 0924   NITRITE Negative 07/21/2023 1306   NITRITE NEGATIVE 02/26/2023 1942   LEUKOCYTESUR Trace (A) 07/21/2023 1306   LEUKOCYTESUR LARGE (A) 02/26/2023 1942    Lab Results  Component Value Date   LABMICR See below: 07/21/2023   WBCUA 0-5 07/21/2023   LABEPIT 0-10 07/21/2023   BACTERIA None seen 07/21/2023    Pertinent Imaging:  No results found for this or any previous visit.  No results found for this or any previous visit.  No results found for this or any previous visit.  No results found for this or any previous visit.  No results found for this or any previous visit.  No results found for this or any previous visit.  Results for orders placed during the hospital encounter of 07/16/23  CT HEMATURIA WORKUP  Narrative CLINICAL DATA:  Gross hematuria,  lower urinary tract symptoms. History  of cervical cancer. * Tracking Code: BO *  EXAM: CT ABDOMEN AND PELVIS WITHOUT AND WITH CONTRAST  TECHNIQUE: Multidetector CT imaging of the abdomen and pelvis was performed following the standard protocol before and following the bolus administration of intravenous contrast.  RADIATION DOSE REDUCTION: This exam was performed according to the departmental dose-optimization program which includes automated exposure control, adjustment of the mA and/or kV according to patient size and/or use of iterative reconstruction technique.  CONTRAST:  OMNIPAQUE  IOHEXOL  300 MG/ML  SOLN  COMPARISON:  PET-CT September 06, 2019  FINDINGS: Lower chest: Small hiatal hernia with mild symmetric distal esophageal wall thickening. Lipomatous hypertrophy of the intra-atrial septum.  Hepatobiliary: Diffuse hepatic steatosis. Gallbladder is unremarkable. No biliary ductal dilation.  Pancreas: No pancreatic ductal dilation or evidence of acute inflammation. 10 mm cyst in the pancreatic tail on image 29/6.  Spleen: No splenomegaly.  Adrenals/Urinary Tract: No suspicious adrenal nodule/mass. No renal, ureteral or bladder calculi.  No solid enhancing renal mass.  No hydronephrosis. Kidneys demonstrate symmetric enhancement and excretion of contrast material. No collecting system duplication. No suspicious filling defect identified within the opacified portions of the collecting systems or ureters on delayed imaging.  Urinary bladder is unremarkable for degree of distension given limited evaluation due urine contamination.  Stomach/Bowel: Tiny hiatal hernia. Stomach is otherwise unremarkable for degree of distension. No pathologic dilation of large or small bowel. No evidence of acute bowel inflammation. Colonic diverticulosis.  Vascular/Lymphatic: Aortic atherosclerosis. Normal caliber abdominal aorta. Smooth IVC contours. The portal, splenic and superior mesenteric veins are patent. No  pathologically enlarged abdominal or pelvic lymph nodes.  Reproductive: Uterus and bilateral adnexa are unremarkable.  Other: No significant abdominopelvic free fluid.  Musculoskeletal: No aggressive lytic or blastic lesion of bone. No acute osseous abnormality.  IMPRESSION: 1. No hydronephrosis. No renal, ureteral or bladder calculi. 2. No solid enhancing renal mass. 3. Urinary bladder is unremarkable for degree of distension given limited evaluation due to urine contamination. 4. Small hiatal hernia with mild symmetric distal esophageal wall thickening, which may reflect esophagitis. 5. 10 mm cyst in the pancreatic tail, likely a side branch IPMN. Recommend follow up pre and post contrast MRI/MRCP or pancreatic protocol CT. 6. Diffuse hepatic steatosis. 7. Colonic diverticulosis without evidence of acute diverticulitis.   Electronically Signed By: Reyes Holder M.D. On: 07/22/2023 15:38  No results found for this or any previous visit.   Assessment & Plan:    1. Gross hematuria (Primary) -resolved - Urinalysis, Routine w reflex microscopic  2. Frequent UTI -continue premarin  every other day.    No follow-ups on file.  Belvie Clara, MD  [facility] [place]

## 2023-10-28 ENCOUNTER — Encounter (HOSPITAL_BASED_OUTPATIENT_CLINIC_OR_DEPARTMENT_OTHER): Payer: Self-pay | Admitting: Physical Therapy

## 2023-10-28 ENCOUNTER — Ambulatory Visit (HOSPITAL_BASED_OUTPATIENT_CLINIC_OR_DEPARTMENT_OTHER): Admitting: Physical Therapy

## 2023-10-28 DIAGNOSIS — M79671 Pain in right foot: Secondary | ICD-10-CM

## 2023-10-28 DIAGNOSIS — R200 Anesthesia of skin: Secondary | ICD-10-CM

## 2023-10-28 DIAGNOSIS — M6281 Muscle weakness (generalized): Secondary | ICD-10-CM

## 2023-10-28 NOTE — Therapy (Signed)
 OUTPATIENT PHYSICAL THERAPY THORACOLUMBAR TREATMENT   Patient Name: Tamara Osborne MRN: [id number] DOB:05-11-1975, 48 y.o., female Today's Date: 10/28/2023  END OF SESSION:  PT End of Session - 10/28/23 1103     Visit Number 5    Date for PT Re-Evaluation 11/12/23    Authorization Type [employer] med[employer]    Authorization Time Period 7/31 - 9/28    Authorization - Visit Number 4    Authorization - Number of Visits 8    PT Start Time 1100    PT Stop Time 1140    PT Time Calculation (min) 40 min    Activity Tolerance Patient tolerated treatment well    Behavior During Therapy WFL for tasks assessed/performed           Past Medical History:  Diagnosis Date   Anemia    Angio-edema    Anxiety    Bartholin cyst    Cervical cancer (HCC)    Chronic kidney disease    Constipation    Depression    Deviated septum    Diabetes mellitus without complication (HCC)    Difficult intravenous access    used port for 05-09-2019 surgery   Fibroids    History of blood transfusion    2 units given 04-26-2019, has had total of 8 units since jan 2021   History of blood transfusion 1 unit given 05-30-19   iv fluids also given   History of cervical cancer 07/16/2022   History of kidney problems    History of radiation therapy 03/23/2019-05/04/2019   Cervical external beam   Dr Lynwood Nasuti   History of radiation therapy 05/09/2019-06/05/2019   vaginal brachytherapy   Dr Lynwood Nasuti   History of recent blood transfusion 05/16/2019   2 units given  per dr melrose at cancer center   Hypertension    Neuropathy    both thumbs   Obesity    Palpitations    PONV (postoperative nausea and vomiting)    Prediabetes    Preeclampsia 2006   Sleep apnea    no cpap used insurance would not cover, osa severe per pt   SOB (shortness of breath)    Squamous cell carcinoma of cervix (HCC) 03/23/2019   Swallowing difficulty    Symptomatic skin lesion 07/14/2022   Vitamin D  deficiency 01/10/2020    Past Surgical History:  Procedure Laterality Date   CESAREAN SECTION WITH BILATERAL TUBAL LIGATION  11/10/2004       DILATION AND CURETTAGE OF UTERUS  1997   IR IMAGING GUIDED PORT INSERTION  04/04/2019   IR REMOVAL TUN ACCESS W/ PORT W/O FL MOD SED  10/27/2021   OPERATIVE ULTRASOUND N/A 05/09/2019   Procedure: OPERATIVE ULTRASOUND;  Surgeon: Nasuti Lynwood, MD;  Location: [facility];  Service: Urology;  Laterality: N/A;   OPERATIVE ULTRASOUND N/A 05/15/2019   Procedure: OPERATIVE ULTRASOUND;  Surgeon: Nasuti Lynwood, MD;  Location: [facility];  Service: Urology;  Laterality: N/A;   OPERATIVE ULTRASOUND N/A 05/22/2019   Procedure: OPERATIVE ULTRASOUND;  Surgeon: Nasuti Lynwood, MD;  Location: [facility];  Service: Urology;  Laterality: N/A;   OPERATIVE ULTRASOUND N/A 05/29/2019   Procedure: OPERATIVE ULTRASOUND;  Surgeon: Nasuti Lynwood, MD;  Location: [facility];  Service: Urology;  Laterality: N/A;   OPERATIVE ULTRASOUND N/A 06/05/2019   Procedure: OPERATIVE ULTRASOUND;  Surgeon: Nasuti Lynwood, MD;  Location: [facility];  Service: Urology;  Laterality: N/A;   TANDEM RING INSERTION  N/A 05/09/2019   Procedure: TANDEM RING INSERTION;  Surgeon: Shannon Agent, MD;  Location: [facility];  Service: Urology;  Laterality: N/A;   TANDEM RING INSERTION N/A 05/15/2019   Procedure: TANDEM RING INSERTION;  Surgeon: Shannon Agent, MD;  Location: [facility];  Service: Urology;  Laterality: N/A;   TANDEM RING INSERTION N/A 05/22/2019   Procedure: TANDEM RING INSERTION;  Surgeon: Shannon Agent, MD;  Location: [facility];  Service: Urology;  Laterality: N/A;   TANDEM RING INSERTION N/A 05/29/2019   Procedure: TANDEM RING INSERTION;  Surgeon: Shannon Agent, MD;  Location: [facility];  Service: Urology;  Laterality: N/A;   TANDEM RING INSERTION N/A 06/05/2019   Procedure: TANDEM RING  INSERTION;  Surgeon: Shannon Agent, MD;  Location: [facility];  Service: Urology;  Laterality: N/A;   Patient Active Problem List   Diagnosis Date Noted   Genetic testing 10/11/2023   Pancreatic cyst 10/09/2023   Colon cancer screening 10/09/2023   Fatty liver 10/09/2023   Diverticulosis 10/09/2023   Hepatic steatosis 08/15/2023   Morbid obesity (HCC) 07/10/2023   History of radiation therapy 05/21/2023   Difficult intravenous access 05/21/2023   HSV-2 infection 07/13/2022   Dyspareunia in female 10/23/2021   Hyperlipidemia associated with type 2 diabetes mellitus (HCC) 07/03/2021   Type 2 diabetes mellitus with stage 3b chronic kidney disease, without long-term current use of insulin  (HCC) 11/15/2020   OSA (obstructive sleep apnea) 07/31/2020   Other hyperlipidemia 01/10/2020   Insulin  resistance 01/10/2020   Class 2 severe obesity with serious comorbidity and body mass index (BMI) of 39.0 to 39.9 in adult [facility]) 01/10/2020   Peripheral 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) 04/25/2019   CKD (chronic kidney disease), symptom management only, stage 3 (moderate) (HCC) 03/30/2019   Generalized anxiety disorder 03/30/2019   Hypertension associated with type 2 diabetes mellitus (HCC)    Squamous cell carcinoma of cervix (HCC) 03/23/2019   Deficiency anemia 03/22/2019    PCP: n/a  REFERRING PROVIDER: Knute Thersia Bitters, FNP   REFERRING DIAG: M25.50,G89.29 (ICD-10-CM) - Chronic joint pain   Rationale for Evaluation and Treatment: Rehabilitation  THERAPY DIAG:  Bilateral foot pain  Numbness and tingling of right lower extremity  Muscle weakness (generalized)  ONSET DATE: x 1 year  SUBJECTIVE:                                                                                                                                                                                           SUBJECTIVE STATEMENT: I seem to swell every other day.  I don't know what I am  doing  or not doing  Pain 5/10 about where it always is  POOL ACCESS:  Applied for scholarship for [employer] should hear in the next week.  Free access to 1 hr cancer group water class at [facility] on Saturdays.   Initial Subjective My left foot has a lot of pain.  Had a cortizone shot and it is better.  Since cancer treatments in 2021 I have a lot of neuropathies in my lower legs and feet.  I also have numbness in right knee through hip after standing for up to 10 minutes.  It happens everyday.  My joints hurt every day hips and knees.  Cancer doctors says my leg/and joint pain is unrelated to the cancer. Pain greater in am when first getting up.  PERTINENT HISTORY:  hx of hypertension, preeclampsia, hyperlipidemia, OSA, diabetes, squamous cell carcinoma of cervix s/p radiation and chemotherapy, morbid obesity. Has had 2 other episodes of aquatic intervention since 2022  PAIN:  Are you having pain? Yes ( no pain/ just numbness) : NPRS scale: current normal 4/10; Pain location: RLE to foot Pain description: numbness tingling Aggravating factors: standing >10 mins Relieving factors: sitting x 10 minutes  PRECAUTIONS: None   RED FLAGS: None      WEIGHT BEARING RESTRICTIONS: No   FALLS:  Has patient fallen in last 6 months? No   LIVING ENVIRONMENT: Lives with: lives with their partner Lives in: House/apartment Stairs: split foyer, 6 steps up/down, bilat HR Has following equipment at home: None   PLOF: Independent   OCCUPATION: not working  PLOF: Independent  PATIENT GOALS: get better, find out what's going on  in my back  NEXT MD VISIT: weekly  OBJECTIVE:  Note: Objective measures were completed at Evaluation unless otherwise noted.  DIAGNOSTIC FINDINGS:  N/a As per pt Oa in left foot  PATIENT SURVEYS:  LEFS  Extreme difficulty/unable (0), Quite a bit of difficulty (1), Moderate difficulty (2), Little difficulty (3), No difficulty (4) Survey date:    Any of your usual  work, housework or school activities 2  2. Usual hobbies, recreational or sporting activities 1  3. Getting into/out of the bath 2  4. Walking between rooms 3  5. Putting on socks/shoes 1  6. Squatting  2  7. Lifting an object, like a bag of groceries from the floor 3  8. Performing light activities around your home 3  9. Performing heavy activities around your home 1  10. Getting into/out of a car 2  11. Walking 2 blocks 1  12. Walking 1 mile 0  13. Going up/down 10 stairs (1 flight) 1  14. Standing for 1 hour 0  15.  sitting for 1 hour 0  16. Running on even ground 0  17. Running on uneven ground 0  18. Making sharp turns while running fast 0  19. Hopping  0  20. Rolling over in bed 2  Score total:  24/80     COGNITION: Overall cognitive status: Within functional limits for tasks assessed     SENSATION: Peripheral neuropathies bilat feet  MUSCLE LENGTH: Hamstrings: right hamstring tightness> left   POSTURE: rounded shoulders, forward head, and decreased lumbar lordosis   PALPATION: No TTP  LUMBAR ROM:   wfl  LOWER EXTREMITY ROM:     Full ROM in bilateral hips. Tightness in the hip adductors and right hamstring  LOWER EXTREMITY strength:    HD lbs Right eval Left eval  Hip flexion 35.2 50.0  Hip extension    Hip abduction 21.2 30.9  Hip adduction    Hip internal rotation    Hip external rotation    Knee flexion    Knee extension 20.8 26.3  Ankle dorsiflexion    Ankle plantarflexion    Ankle inversion    Ankle eversion     (Blank rows = not tested)   FUNCTIONAL TESTS:  5 times sit to stand: 18.79 Timed up and go (TUG): 14.94s   4 stage balance: passed 1&2. Tandem stance small steps to gain position hold x 8s; SLS x6 step GAIT: Distance walked: 500 ft Assistive device utilized: None Level of assistance: Complete Independence Comments: initial gait slightly antalgic.  TREATMENT  [facility] Adult PT Treatment:                                                 DATE:10/28/23 Pt seen for aquatic therapy today.  Treatment took place in water 3.5-4.75 ft in depth at the [facility] pool. Temp of water was 91.  Pt entered/exited the pool via stairs using step to pattern with hand rail.  *walking forward/backward/side stepping unsupported high knees multiple widths *standing ue support on wall: TR; heel raises *toe walking forward then back ->heel walking *side stepping on heels then toes *tandems stance ue support RBHB x 20s hold->ue add/abd x 10 leading R/L *SLS as above (Good challenge) some unsteadiness improves with repetition *UE support on RBHB: hip add/abdct crossing midline x 10; hamstring curls x 10 each LE; Hip flex/extension x 10 each LE; relaxed squat x10 *bil calf stretch with heels off of step  *UE support on rainbow hand floats ue add/abd side stepping; side stepping with increased step height  * walking unsupported forward/ backward between exercises for recovery. Cues for proper heel and toe strike  Pt requires the buoyancy and hydrostatic pressure of water for support, and to offload joints by unweighting joint load by at least 50 % in navel deep water and by at least 75-80% in chest to neck deep water.  Viscosity of the water is needed for resistance of strengthening. Water current perturbations provides challenge to standing balance requiring increased core activation.   Self care:frequency of exercise: walking dog changing directions; use of compression stocking for fluid retention control.                                                                                                                               PATIENT EDUCATION:  Education details: reacquainting with aquatic therapy  Person educated: Patient Education method: Explanation Education comprehension: verbalized understanding  HOME EXERCISE PROGRAM: [id number] from previous aquatic episdoe  ASSESSMENT:  CLINICAL IMPRESSION: Pt planning on  returning to [facility] 2 days a week.  Right now she has increased her amb toleration to walking dog  3 x week for 40 minutes which is a significant improvement since onset of therapy. She states the N&T in feet and toes have not improved but as long as she keeps moving the better they feel. Has good days and bad days. Hip joint pain is better. Plan to add to original aquatic HEP in next to sessions and issue with dc anticipated at that time. 2 goals met today.     Initial Impression Patient is a 48 y.o. f who was seen today for physical therapy evaluation and treatment for Chronic joint pain . She is well known to [facility] clinics as well as aquatic therapy finishing an episode of pelvic floor in April 2024 and in aquatics 1 year ago for similar dx.  She reports she has been having constant numbness, tingling  in right anterior thigh in past year daily that occurs with standing >10-15 minutes.  She has constant feet pain and toe numbness due to cancer treatments. No falls.  Testing demonstrates rle stronger than left.  She reports she is limited with functional amb due to LE pain. She has aquatic HEP from previous episode. Plan to see her in aquatics and re establish as well as modify/add to program for indep completion.  OBJECTIVE IMPAIRMENTS: Abnormal gait, decreased activity tolerance, decreased balance, difficulty walking, decreased strength, and impaired sensation.    ACTIVITY LIMITATIONS: standing, stairs, and locomotion level   PARTICIPATION LIMITATIONS: cleaning and community activity   PERSONAL FACTORS: Time since onset of injury/illness/exacerbation and 1-2 comorbidities: PMH are also affecting patient's functional outcome.    REHAB POTENTIAL: Good   CLINICAL DECISION MAKING: Evolving/moderate complexity   EVALUATION COMPLEXITY: Moderate   GOALS: Goals reviewed with patient? Yes  SHORT TERM GOALS: Target date: 10/01/23  Pt will tolerate full aquatic sessions consistently without increase  in pain and with improving function to demonstrate good toleration and effectiveness of intervention.  Baseline: Goal status: MET - 10/07/23  2.  Pt will report gaining pool access Baseline:  Goal status: Partially met -10/07/23; Met 10/28/23    LONG TERM GOALS: Target date: 11/12/23  Pt to improve on LEFS by at least 9 point to demonstrate statistically significant Improvement in function. Baseline: 24/80 Goal status: INITIAL  2.  Pt will be indep with aquatic final HEP for continued management of condition Baseline:  Goal status: In progress 10/28/23  3.  Pt will report decrease in right le numbness and tingling by 50% Baseline:  Goal status: In progress 10/28/23  4.  Pt will report walking dog up to 3 x week to and from mailbox without SOB or excessive pain. Baseline:  Goal status: Met 10/28/23    PLAN:  PT FREQUENCY: 1x/week  PT DURATION: 10 weeks will be delayed 4 weeks due to scheduling conflicts. ^ visit only likely  PLANNED INTERVENTIONS: 97164- PT Re-evaluation, 97750- Physical Performance Testing, 97110-Therapeutic exercises, 97530- Therapeutic activity, [id number]- Neuromuscular re-education, 97535- Self Care, [id number]- Manual therapy, [phone]- Gait training, [phone]- Orthotic Initial, [phone]- Aquatic Therapy, [phone] (1-2 muscles), [id number] (3+ muscles)- Dry Needling, Patient/Family education, Balance training, Stair training, Taping, Joint mobilization, DME instructions, Cryotherapy, and Moist heat.  PLAN FOR NEXT SESSION: Aquatics for le and core strengthening, balance retraining and pain management. HEP  Ronal Kingsley) Gareth Fitzner MPT 10/28/23 11:29 AM [facility] [address] [place], [place], [address] Phone: [phone]   Fax:  [phone]    For all possible CPT codes, reference the Planned Interventions line above.  Check all conditions that are expected to impact treatment: {Conditions expected to impact treatment:Morbid  obesity, Diabetes mellitus, and Musculoskeletal disorders   If treatment provided at initial evaluation, no treatment charged due to lack of authorization.

## 2023-11-03 ENCOUNTER — Ambulatory Visit (HOSPITAL_BASED_OUTPATIENT_CLINIC_OR_DEPARTMENT_OTHER): Attending: Family Medicine | Admitting: Physical Therapy

## 2023-11-03 DIAGNOSIS — M6281 Muscle weakness (generalized): Secondary | ICD-10-CM | POA: Diagnosis present

## 2023-11-03 DIAGNOSIS — M79672 Pain in left foot: Secondary | ICD-10-CM | POA: Diagnosis present

## 2023-11-03 DIAGNOSIS — R202 Paresthesia of skin: Secondary | ICD-10-CM | POA: Insufficient documentation

## 2023-11-03 DIAGNOSIS — R200 Anesthesia of skin: Secondary | ICD-10-CM | POA: Diagnosis present

## 2023-11-03 DIAGNOSIS — M79671 Pain in right foot: Secondary | ICD-10-CM | POA: Insufficient documentation

## 2023-11-03 NOTE — Therapy (Signed)
 OUTPATIENT PHYSICAL THERAPY THORACOLUMBAR TREATMENT   Patient Name: Tamara Osborne MRN: [id number] DOB:Jul 14, 1975, 48 y.o., female Today's Date: 11/03/2023  END OF SESSION:  PT End of Session - 11/03/23 1013     Visit Number 6    Date for PT Re-Evaluation 11/12/23    Authorization Type [employer] med[employer]    Authorization Time Period 7/31 - 9/28    Authorization - Visit Number 5    Authorization - Number of Visits 8    PT Start Time 1014    PT Stop Time 1053    PT Time Calculation (min) 39 min    Activity Tolerance Patient tolerated treatment well    Behavior During Therapy WFL for tasks assessed/performed           Past Medical History:  Diagnosis Date   Anemia    Angio-edema    Anxiety    Bartholin cyst    Cervical cancer (HCC)    Chronic kidney disease    Constipation    Depression    Deviated septum    Diabetes mellitus without complication (HCC)    Difficult intravenous access    used port for 05-09-2019 surgery   Fibroids    History of blood transfusion    2 units given 04-26-2019, has had total of 8 units since jan 2021   History of blood transfusion 1 unit given 05-30-19   iv fluids also given   History of cervical cancer 07/16/2022   History of kidney problems    History of radiation therapy 03/23/2019-05/04/2019   Cervical external beam   Dr Lynwood Nasuti   History of radiation therapy 05/09/2019-06/05/2019   vaginal brachytherapy   Dr Lynwood Nasuti   History of recent blood transfusion 05/16/2019   2 units given  per dr melrose at cancer center   Hypertension    Neuropathy    both thumbs   Obesity    Palpitations    PONV (postoperative nausea and vomiting)    Prediabetes    Preeclampsia 2006   Sleep apnea    no cpap used insurance would not cover, osa severe per pt   SOB (shortness of breath)    Squamous cell carcinoma of cervix (HCC) 03/23/2019   Swallowing difficulty    Symptomatic skin lesion 07/14/2022   Vitamin D  deficiency 01/10/2020    Past Surgical History:  Procedure Laterality Date   CESAREAN SECTION WITH BILATERAL TUBAL LIGATION  11/10/2004       DILATION AND CURETTAGE OF UTERUS  1997   IR IMAGING GUIDED PORT INSERTION  04/04/2019   IR REMOVAL TUN ACCESS W/ PORT W/O FL MOD SED  10/27/2021   OPERATIVE ULTRASOUND N/A 05/09/2019   Procedure: OPERATIVE ULTRASOUND;  Surgeon: Nasuti Lynwood, MD;  Location: [facility];  Service: Urology;  Laterality: N/A;   OPERATIVE ULTRASOUND N/A 05/15/2019   Procedure: OPERATIVE ULTRASOUND;  Surgeon: Nasuti Lynwood, MD;  Location: [facility];  Service: Urology;  Laterality: N/A;   OPERATIVE ULTRASOUND N/A 05/22/2019   Procedure: OPERATIVE ULTRASOUND;  Surgeon: Nasuti Lynwood, MD;  Location: [facility];  Service: Urology;  Laterality: N/A;   OPERATIVE ULTRASOUND N/A 05/29/2019   Procedure: OPERATIVE ULTRASOUND;  Surgeon: Nasuti Lynwood, MD;  Location: [facility];  Service: Urology;  Laterality: N/A;   OPERATIVE ULTRASOUND N/A 06/05/2019   Procedure: OPERATIVE ULTRASOUND;  Surgeon: Nasuti Lynwood, MD;  Location: [facility];  Service: Urology;  Laterality: N/A;   TANDEM RING INSERTION  N/A 05/09/2019   Procedure: TANDEM RING INSERTION;  Surgeon: Shannon Agent, MD;  Location: [facility];  Service: Urology;  Laterality: N/A;   TANDEM RING INSERTION N/A 05/15/2019   Procedure: TANDEM RING INSERTION;  Surgeon: Shannon Agent, MD;  Location: [facility];  Service: Urology;  Laterality: N/A;   TANDEM RING INSERTION N/A 05/22/2019   Procedure: TANDEM RING INSERTION;  Surgeon: Shannon Agent, MD;  Location: [facility];  Service: Urology;  Laterality: N/A;   TANDEM RING INSERTION N/A 05/29/2019   Procedure: TANDEM RING INSERTION;  Surgeon: Shannon Agent, MD;  Location: [facility];  Service: Urology;  Laterality: N/A;   TANDEM RING INSERTION N/A 06/05/2019   Procedure: TANDEM RING  INSERTION;  Surgeon: Shannon Agent, MD;  Location: [facility];  Service: Urology;  Laterality: N/A;   Patient Active Problem List   Diagnosis Date Noted   Genetic testing 10/11/2023   Pancreatic cyst 10/09/2023   Colon cancer screening 10/09/2023   Fatty liver 10/09/2023   Diverticulosis 10/09/2023   Hepatic steatosis 08/15/2023   Morbid obesity (HCC) 07/10/2023   History of radiation therapy 05/21/2023   Difficult intravenous access 05/21/2023   HSV-2 infection 07/13/2022   Dyspareunia in female 10/23/2021   Hyperlipidemia associated with type 2 diabetes mellitus (HCC) 07/03/2021   Type 2 diabetes mellitus with stage 3b chronic kidney disease, without long-term current use of insulin  (HCC) 11/15/2020   OSA (obstructive sleep apnea) 07/31/2020   Other hyperlipidemia 01/10/2020   Insulin  resistance 01/10/2020   Class 2 severe obesity with serious comorbidity and body mass index (BMI) of 39.0 to 39.9 in adult [facility]) 01/10/2020   Peripheral 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) 04/25/2019   CKD (chronic kidney disease), symptom management only, stage 3 (moderate) (HCC) 03/30/2019   Generalized anxiety disorder 03/30/2019   Hypertension associated with type 2 diabetes mellitus (HCC)    Squamous cell carcinoma of cervix (HCC) 03/23/2019   Deficiency anemia 03/22/2019    PCP: debara  REFERRING PROVIDER: Knute Thersia Bitters, FNP   REFERRING DIAG: M25.50,G89.29 (ICD-10-CM) - Chronic joint pain   Rationale for Evaluation and Treatment: Rehabilitation  THERAPY DIAG:  Bilateral foot pain  Numbness and tingling of right lower extremity  Muscle weakness (generalized)  ONSET DATE: x 1 year  SUBJECTIVE:                                                                                                                                                                                           SUBJECTIVE STATEMENT: Pt reports that she is going to [employer] 2-3x per week and is still  getting accustomed to the silver sneakers classes.  Plans to also use pool for light aquatic exercises on same days.   POOL ACCESS:  has membership to [employer].  Free access to 1 hr cancer group water class at [facility] on Saturdays.   Initial Subjective My left foot has a lot of pain.  Had a cortizone shot and it is better.  Since cancer treatments in 2021 I have a lot of neuropathies in my lower legs and feet.  I also have numbness in right knee through hip after standing for up to 10 minutes.  It happens everyday.  My joints hurt every day hips and knees.  Cancer doctors says my leg/and joint pain is unrelated to the cancer. Pain greater in am when first getting up.  PERTINENT HISTORY:  hx of hypertension, preeclampsia, hyperlipidemia, OSA, diabetes, squamous cell carcinoma of cervix s/p radiation and chemotherapy, morbid obesity. Has had 2 other episodes of aquatic intervention since 2022  PAIN:  Are you having pain? Yes ( no pain/ just numbness) : NPRS scale: current normal 3-4/10; Pain location: RLE to foot Pain description: numbness tingling Aggravating factors: standing >10 mins Relieving factors: sitting x 10 minutes  PRECAUTIONS: None   RED FLAGS: None      WEIGHT BEARING RESTRICTIONS: No   FALLS:  Has patient fallen in last 6 months? No   LIVING ENVIRONMENT: Lives with: lives with their partner Lives in: House/apartment Stairs: split foyer, 6 steps up/down, bilat HR Has following equipment at home: None   PLOF: Independent   OCCUPATION: not working  PLOF: Independent  PATIENT GOALS: get better, find out what's going on  in my back  NEXT MD VISIT: weekly  OBJECTIVE:  Note: Objective measures were completed at Evaluation unless otherwise noted.  DIAGNOSTIC FINDINGS:  N/a As per pt Oa in left foot  PATIENT SURVEYS:  LEFS  Extreme difficulty/unable (0), Quite a bit of difficulty (1), Moderate difficulty (2), Little difficulty (3), No difficulty (4) Survey  date:    Any of your usual work, housework or school activities 2  2. Usual hobbies, recreational or sporting activities 1  3. Getting into/out of the bath 2  4. Walking between rooms 3  5. Putting on socks/shoes 1  6. Squatting  2  7. Lifting an object, like a bag of groceries from the floor 3  8. Performing light activities around your home 3  9. Performing heavy activities around your home 1  10. Getting into/out of a car 2  11. Walking 2 blocks 1  12. Walking 1 mile 0  13. Going up/down 10 stairs (1 flight) 1  14. Standing for 1 hour 0  15.  sitting for 1 hour 0  16. Running on even ground 0  17. Running on uneven ground 0  18. Making sharp turns while running fast 0  19. Hopping  0  20. Rolling over in bed 2  Score total:  24/80     COGNITION: Overall cognitive status: Within functional limits for tasks assessed     SENSATION: Peripheral neuropathies bilat feet  MUSCLE LENGTH: Hamstrings: right hamstring tightness> left   POSTURE: rounded shoulders, forward head, and decreased lumbar lordosis   PALPATION: No TTP  LUMBAR ROM:   wfl  LOWER EXTREMITY ROM:     Full ROM in bilateral hips. Tightness in the hip adductors and right hamstring  LOWER EXTREMITY strength:    HD lbs Right eval Left eval  Hip flexion 35.2 50.0  Hip extension    Hip abduction 21.2 30.9  Hip adduction    Hip internal rotation    Hip external rotation    Knee flexion    Knee extension 20.8 26.3  Ankle dorsiflexion    Ankle plantarflexion    Ankle inversion    Ankle eversion     (Blank rows = not tested)   FUNCTIONAL TESTS:  5 times sit to stand: 18.79 Timed up and go (TUG): 14.94s   4 stage balance: passed 1&2. Tandem stance small steps to gain position hold x 8s; SLS x6 step GAIT: Distance walked: 500 ft Assistive device utilized: None Level of assistance: Complete Independence Comments: initial gait slightly antalgic.  TREATMENT  [facility] Adult PT Treatment:                                                 DATE:11/03/23 Pt seen for aquatic therapy today.  Treatment took place in water 3.5-4.75 ft in depth at the [facility] pool. Temp of water was 91.  Pt entered/exited the pool via stairs using step to pattern with hand rail.  *walking forward/backward/side stepping unsupported high knees multiple widths * side stepping with arm add/abdct with rainbow hand floats  * forward walking kicks  *standing UE support on rainbow hand floats:   heel/ toe raises x 15; hip abdct/ add x 10 each LE; leg swings into hip flexion/ extension x 10 each; hamstring curls x 10 each LE * forward step ups x 10 each LE; lateral step ups x 10 each LE * return to walking forward/ backward  * knee taps to same side/ opposite side with rainbow hand floats x 10 each * cycling unsupported x 3 min * bil calf stretch with heels off edge of step x 20s  PATIENT EDUCATION:  Education details: reacquainting with aquatic therapy  Person educated: Patient Education method: Explanation Education comprehension: verbalized understanding  HOME EXERCISE PROGRAM: [email or web address]   ASSESSMENT:  CLINICAL IMPRESSION: No change in numbness in RLE, but pt learning to manage symptoms better. Plan to issue aquatic HEP in next  session and instruct on exercises.  Plan to work on reciprocal pattern on stairs next visit and check LEFS if time allows.  Pt making great progress towards remaining goals.      Initial Impression Patient is a 48 y.o. f who was seen today for physical therapy evaluation and treatment for Chronic joint pain . She is well known to [facility] clinics as well as aquatic therapy finishing an episode of pelvic floor in April 2024 and in aquatics 1 year ago for similar dx.  She reports she has been having constant numbness, tingling  in right anterior thigh in past year daily that occurs with standing >10-15 minutes.  She has constant feet pain and toe numbness due to cancer treatments. No  falls.  Testing demonstrates rle stronger than left.  She reports she is limited with functional amb due to LE pain. She has aquatic HEP from previous episode. Plan to see her in aquatics and re establish as well as modify/add to program for indep completion.  OBJECTIVE IMPAIRMENTS: Abnormal gait, decreased activity tolerance, decreased balance, difficulty walking, decreased strength, and impaired sensation.    ACTIVITY LIMITATIONS: standing, stairs, and locomotion level   PARTICIPATION LIMITATIONS: cleaning and community activity   PERSONAL FACTORS: Time since onset  of injury/illness/exacerbation and 1-2 comorbidities: PMH are also affecting patient's functional outcome.    REHAB POTENTIAL: Good   CLINICAL DECISION MAKING: Evolving/moderate complexity   EVALUATION COMPLEXITY: Moderate   GOALS: Goals reviewed with patient? Yes  SHORT TERM GOALS: Target date: 10/01/23  Pt will tolerate full aquatic sessions consistently without increase in pain and with improving function to demonstrate good toleration and effectiveness of intervention.  Baseline: Goal status: MET - 10/07/23  2.  Pt will report gaining pool access Baseline:  Goal status:  Met 10/28/23    LONG TERM GOALS: Target date: 11/12/23  Pt to improve on LEFS by at least 9 point to demonstrate statistically significant Improvement in function. Baseline: 24/80 Goal status: INITIAL  2.  Pt will be indep with aquatic final HEP for continued management of condition Baseline:  Goal status: In progress 10/28/23  3.  Pt will report decrease in right le numbness and tingling by 50% Baseline:  Goal status: In progress 10/28/23  4.  Pt will report walking dog up to 3 x week to and from mailbox without SOB or excessive pain. Baseline:  Goal status: Met 10/28/23    PLAN:  PT FREQUENCY: 1x/week  PT DURATION: 10 weeks will be delayed 4 weeks due to scheduling conflicts. ^ visit only likely  PLANNED INTERVENTIONS: [id number]- PT  Re-evaluation, 97750- Physical Performance Testing, 97110-Therapeutic exercises, 97530- Therapeutic activity, [id number]- Neuromuscular re-education, 97535- Self Care, [id number]- Manual therapy, [phone]- Gait training, [phone]- Orthotic Initial, [phone]- Aquatic Therapy, [phone] (1-2 muscles), [phone] (3+ muscles)- Dry Needling, Patient/Family education, Balance training, Stair training, Taping, Joint mobilization, DME instructions, Cryotherapy, and Moist heat.  PLAN FOR NEXT SESSION: Aquatics for le and core strengthening, balance retraining and pain management. HEP   Delon Aquas, [place] 11/03/23 1:06 PM [facility] [address] [place], [place], [address] Phone: [phone]   Fax:  [phone]   For all possible CPT codes, reference the Planned Interventions line above.     Check all conditions that are expected to impact treatment: {Conditions expected to impact treatment:Morbid obesity, Diabetes mellitus, and Musculoskeletal disorders   If treatment provided at initial evaluation, no treatment charged due to lack of authorization.

## 2023-11-04 ENCOUNTER — Ambulatory Visit (HOSPITAL_BASED_OUTPATIENT_CLINIC_OR_DEPARTMENT_OTHER): Admitting: Physical Therapy

## 2023-11-12 ENCOUNTER — Ambulatory Visit (HOSPITAL_BASED_OUTPATIENT_CLINIC_OR_DEPARTMENT_OTHER): Payer: Self-pay | Admitting: Physical Therapy

## 2023-11-12 ENCOUNTER — Encounter (HOSPITAL_BASED_OUTPATIENT_CLINIC_OR_DEPARTMENT_OTHER): Payer: Self-pay

## 2023-11-15 ENCOUNTER — Encounter (HOSPITAL_BASED_OUTPATIENT_CLINIC_OR_DEPARTMENT_OTHER): Payer: Self-pay | Admitting: Physical Therapy

## 2023-11-15 ENCOUNTER — Ambulatory Visit (HOSPITAL_BASED_OUTPATIENT_CLINIC_OR_DEPARTMENT_OTHER): Payer: Self-pay | Admitting: Physical Therapy

## 2023-11-15 DIAGNOSIS — M6281 Muscle weakness (generalized): Secondary | ICD-10-CM

## 2023-11-15 DIAGNOSIS — M79671 Pain in right foot: Secondary | ICD-10-CM | POA: Diagnosis not present

## 2023-11-15 DIAGNOSIS — R200 Anesthesia of skin: Secondary | ICD-10-CM

## 2023-11-15 NOTE — Therapy (Signed)
 OUTPATIENT PHYSICAL THERAPY THORACOLUMBAR TREATMENT PHYSICAL THERAPY DISCHARGE SUMMARY  Visits from Start of Care: 7  Current functional level related to goals / functional outcomes: Indep   Remaining deficits: Chronic peripheral neuropathies feet   Education / Equipment: Management of condition/HEP   Patient agrees to discharge. Patient goals were all met but 1. Patient is being discharged due to maximized rehab potential.    Patient Name: Tamara Osborne MRN: [id number] DOB:December 15, 1975, 48 y.o., female Today's Date: 11/15/2023  END OF SESSION:  PT End of Session - 11/15/23 0758     Visit Number 7    Number of Visits 7    Date for PT Re-Evaluation 11/16/23    Authorization Type [employer] med[employer]    Authorization Time Period 7/31 - 9/28    Authorization - Visit Number 7    Authorization - Number of Visits 8    PT Start Time 0800    PT Stop Time 0840    PT Time Calculation (min) 40 min    Activity Tolerance Patient tolerated treatment well    Behavior During Therapy [facility] for tasks assessed/performed           Past Medical History:  Diagnosis Date   Anemia    Angio-edema    Anxiety    Bartholin cyst    Cervical cancer (HCC)    Chronic kidney disease    Constipation    Depression    Deviated septum    Diabetes mellitus without complication (HCC)    Difficult intravenous access    used port for 05-09-2019 surgery   Fibroids    History of blood transfusion    2 units given 04-26-2019, has had total of 8 units since jan 2021   History of blood transfusion 1 unit given 05-30-19   iv fluids also given   History of cervical cancer 07/16/2022   History of kidney problems    History of radiation therapy 03/23/2019-05/04/2019   Cervical external beam   Dr Lynwood Nasuti   History of radiation therapy 05/09/2019-06/05/2019   vaginal brachytherapy   Dr Lynwood Nasuti   History of recent blood transfusion 05/16/2019   2 units given  per dr melrose at cancer center    Hypertension    Neuropathy    both thumbs   Obesity    Palpitations    PONV (postoperative nausea and vomiting)    Prediabetes    Preeclampsia 2006   Sleep apnea    no cpap used insurance would not cover, osa severe per pt   SOB (shortness of breath)    Squamous cell carcinoma of cervix (HCC) 03/23/2019   Swallowing difficulty    Symptomatic skin lesion 07/14/2022   Vitamin D  deficiency 01/10/2020   Past Surgical History:  Procedure Laterality Date   CESAREAN SECTION WITH BILATERAL TUBAL LIGATION  11/10/2004       DILATION AND CURETTAGE OF UTERUS  1997   IR IMAGING GUIDED PORT INSERTION  04/04/2019   IR REMOVAL TUN ACCESS W/ PORT W/O FL MOD SED  10/27/2021   OPERATIVE ULTRASOUND N/A 05/09/2019   Procedure: OPERATIVE ULTRASOUND;  Surgeon: Nasuti Lynwood, MD;  Location: [facility];  Service: Urology;  Laterality: N/A;   OPERATIVE ULTRASOUND N/A 05/15/2019   Procedure: OPERATIVE ULTRASOUND;  Surgeon: Nasuti Lynwood, MD;  Location: [facility];  Service: Urology;  Laterality: N/A;   OPERATIVE ULTRASOUND N/A 05/22/2019   Procedure: OPERATIVE ULTRASOUND;  Surgeon: Nasuti Lynwood, MD;  Location: [facility];  Service: Urology;  Laterality: N/A;   OPERATIVE ULTRASOUND N/A 05/29/2019   Procedure: OPERATIVE ULTRASOUND;  Surgeon: Shannon Agent, MD;  Location: [facility];  Service: Urology;  Laterality: N/A;   OPERATIVE ULTRASOUND N/A 06/05/2019   Procedure: OPERATIVE ULTRASOUND;  Surgeon: Shannon Agent, MD;  Location: [facility];  Service: Urology;  Laterality: N/A;   TANDEM RING INSERTION N/A 05/09/2019   Procedure: TANDEM RING INSERTION;  Surgeon: Shannon Agent, MD;  Location: [facility];  Service: Urology;  Laterality: N/A;   TANDEM RING INSERTION N/A 05/15/2019   Procedure: TANDEM RING INSERTION;  Surgeon: Shannon Agent, MD;  Location: [facility];  Service: Urology;  Laterality: N/A;   TANDEM  RING INSERTION N/A 05/22/2019   Procedure: TANDEM RING INSERTION;  Surgeon: Shannon Agent, MD;  Location: [facility];  Service: Urology;  Laterality: N/A;   TANDEM RING INSERTION N/A 05/29/2019   Procedure: TANDEM RING INSERTION;  Surgeon: Shannon Agent, MD;  Location: [facility];  Service: Urology;  Laterality: N/A;   TANDEM RING INSERTION N/A 06/05/2019   Procedure: TANDEM RING INSERTION;  Surgeon: Shannon Agent, MD;  Location: [facility];  Service: Urology;  Laterality: N/A;   Patient Active Problem List   Diagnosis Date Noted   Genetic testing 10/11/2023   Pancreatic cyst 10/09/2023   Colon cancer screening 10/09/2023   Fatty liver 10/09/2023   Diverticulosis 10/09/2023   Hepatic steatosis 08/15/2023   Morbid obesity (HCC) 07/10/2023   History of radiation therapy 05/21/2023   Difficult intravenous access 05/21/2023   HSV-2 infection 07/13/2022   Dyspareunia in female 10/23/2021   Hyperlipidemia associated with type 2 diabetes mellitus (HCC) 07/03/2021   Type 2 diabetes mellitus with stage 3b chronic kidney disease, without long-term current use of insulin  (HCC) 11/15/2020   OSA (obstructive sleep apnea) 07/31/2020   Other hyperlipidemia 01/10/2020   Insulin  resistance 01/10/2020   Class 2 severe obesity with serious comorbidity and body mass index (BMI) of 39.0 to 39.9 in adult (HCC) 01/10/2020   Peripheral 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) 04/25/2019   CKD (chronic kidney disease), symptom management only, stage 3 (moderate) (HCC) 03/30/2019   Generalized anxiety disorder 03/30/2019   Hypertension associated with type 2 diabetes mellitus (HCC)    Squamous cell carcinoma of cervix (HCC) 03/23/2019   Deficiency anemia 03/22/2019    PCP: n/a  REFERRING PROVIDER: Knute Thersia Bitters, FNP   REFERRING DIAG: M25.50,G89.29 (ICD-10-CM) - Chronic joint pain   Rationale for Evaluation and Treatment: Rehabilitation  THERAPY DIAG:   Bilateral foot pain - Plan: PT plan of care cert/re-cert  Numbness and tingling of right lower extremity - Plan: PT plan of care cert/re-cert  Muscle weakness (generalized) - Plan: PT plan of care cert/re-cert  ONSET DATE: x 1 year  SUBJECTIVE:  SUBJECTIVE STATEMENT: Pt reports that she is going to [employer] 2-3x per week.  Pain has been ~3/10 which is her usual pain.  I feel goodI know what to do in the pool.  I am happy with my progress.  POOL ACCESS:  has membership to [employer].  Free access to 1 hr cancer group water class at [facility] on Saturdays.   Initial Subjective My left foot has a lot of pain.  Had a cortizone shot and it is better.  Since cancer treatments in 2021 I have a lot of neuropathies in my lower legs and feet.  I also have numbness in right knee through hip after standing for up to 10 minutes.  It happens everyday.  My joints hurt every day hips and knees.  Cancer doctors says my leg/and joint pain is unrelated to the cancer. Pain greater in am when first getting up.  PERTINENT HISTORY:  hx of hypertension, preeclampsia, hyperlipidemia, OSA, diabetes, squamous cell carcinoma of cervix s/p radiation and chemotherapy, morbid obesity. Has had 2 other episodes of aquatic intervention since 2022  PAIN:  Are you having pain? Yes ( no pain/ just numbness) : NPRS scale: current normal 3-4/10; Pain location: RLE to foot Pain description: numbness tingling Aggravating factors: standing >10 mins Relieving factors: sitting x 10 minutes  PRECAUTIONS: None   RED FLAGS: None      WEIGHT BEARING RESTRICTIONS: No   FALLS:  Has patient fallen in last 6 months? No   LIVING ENVIRONMENT: Lives with: lives with their partner Lives in: House/apartment Stairs: split foyer, 6 steps up/down, bilat  HR Has following equipment at home: None   PLOF: Independent   OCCUPATION: not working  PLOF: Independent  PATIENT GOALS: get better, find out what's going on  in my back  NEXT MD VISIT: weekly  OBJECTIVE:  Note: Objective measures were completed at Evaluation unless otherwise noted.  DIAGNOSTIC FINDINGS:  N/a As per pt Oa in left foot  PATIENT SURVEYS:  LEFS  Extreme difficulty/unable (0), Quite a bit of difficulty (1), Moderate difficulty (2), Little difficulty (3), No difficulty (4) Survey date:    Any of your usual work, housework or school activities 2  2. Usual hobbies, recreational or sporting activities 1  3. Getting into/out of the bath 2  4. Walking between rooms 3  5. Putting on socks/shoes 1  6. Squatting  2  7. Lifting an object, like a bag of groceries from the floor 3  8. Performing light activities around your home 3  9. Performing heavy activities around your home 1  10. Getting into/out of a car 2  11. Walking 2 blocks 1  12. Walking 1 mile 0  13. Going up/down 10 stairs (1 flight) 1  14. Standing for 1 hour 0  15.  sitting for 1 hour 0  16. Running on even ground 0  17. Running on uneven ground 0  18. Making sharp turns while running fast 0  19. Hopping  0  20. Rolling over in bed 2  Score total:  24/80   11/15/23: 41/80  COGNITION: Overall cognitive status: Within functional limits for tasks assessed     SENSATION: Peripheral neuropathies bilat feet  MUSCLE LENGTH: Hamstrings: right hamstring tightness> left   POSTURE: rounded shoulders, forward head, and decreased lumbar lordosis   PALPATION: No TTP  LUMBAR ROM:   wfl  LOWER EXTREMITY ROM:     Full ROM in bilateral hips. Tightness in the hip adductors and  right hamstring  LOWER EXTREMITY strength:    HD lbs Right eval Left eval  Hip flexion 35.2 50.0  Hip extension    Hip abduction 21.2 30.9  Hip adduction    Hip internal rotation    Hip external rotation    Knee  flexion    Knee extension 20.8 26.3  Ankle dorsiflexion    Ankle plantarflexion    Ankle inversion    Ankle eversion     (Blank rows = not tested)   FUNCTIONAL TESTS:  5 times sit to stand: 18.79 Timed up and go (TUG): 14.94s   4 stage balance: passed 1&2. Tandem stance small steps to gain position hold x 8s; SLS x6 step GAIT: Distance walked: 500 ft Assistive device utilized: None Level of assistance: Complete Independence Comments: initial gait slightly antalgic.  TREATMENT  [facility] Adult PT Treatment:                                                DATE:11/15/23 Pt seen for aquatic therapy today.  Treatment took place in water 3.5-4.75 ft in depth at the [facility] pool. Temp of water was 91.  Pt entered/exited the pool via stairs using step to pattern with hand rail.  *walking forward/backward/side stepping unsupported high knees multiple widths * side stepping with arm add/abdct with rainbow hand floats  * forward walking kicks  *standing UE support on rainbow hand floats:   heel/ toe raises x 15; hip abdct/ add x 10 each LE; leg swings into hip flexion/ extension x 10 each; hamstring curls x 10 each LE * forward step ups x 10 each LE; lateral step ups x 10 each LE * return to walking forward/ backward  * knee taps to same side/ opposite side with rainbow hand floats x 10 each * cycling unsupported x 3 min * bil calf stretch with heels off edge of step x 20s  PATIENT EDUCATION:  Education details: reacquainting with aquatic therapy  Person educated: Patient Education method: Explanation Education comprehension: verbalized understanding  HOME EXERCISE PROGRAM: [email or web address]   Access Code: [email or web address] URL: [email or web address] Date: 11/15/2023 Prepared by: Matilda Kohut  Exercises - walking forward/ backward with gentle arm swing  - 3 x weekly - Side Stepping with or without Hand floats   - 3 x weekly - Forward March with Opposite Arm Knee Taps and  Hand Floats  - 3 x weekly - Holding wall - leg kick to side (not too high)  - 3 x weekly - 1-2 sets - 10 reps - Forward Backward Leg Swing - hold wall or hand floats  - 3 x weekly - 1-2 sets - 10 reps - single or bilateral calf raise, holding wall or hand floats  - 3 x weekly - 1-2 sets - 10 reps - Step Up  - 3 x weekly - 1-2 sets - 10 reps - Lateral Step Up  - 3 x weekly - 1-2 sets - 10 reps - Seated straddle on noodle, holding corner, bicycle legs   - 3 x weekly - calf stretch with heels off of step  - 3 x weekly - 2 reps - 20 hold  ASSESSMENT:  CLINICAL IMPRESSION: Pt instructed on final aquatic HEP.  She is given minor VC and written clarifications on program which is laminated and issued.  She demonstrates indep.  She has  already begun using the pool at the [facility].  Most goals met.  Numbness and tingling goal not met due to the chronic nature of dysfunction.  She is happy with progress. Pt DC    OBJECTIVE IMPAIRMENTS: Abnormal gait, decreased activity tolerance, decreased balance, difficulty walking, decreased strength, and impaired sensation.    ACTIVITY LIMITATIONS: standing, stairs, and locomotion level   PARTICIPATION LIMITATIONS: cleaning and community activity   PERSONAL FACTORS: Time since onset of injury/illness/exacerbation and 1-2 comorbidities: PMH are also affecting patient's functional outcome.    REHAB POTENTIAL: Good   CLINICAL DECISION MAKING: Evolving/moderate complexity   EVALUATION COMPLEXITY: Moderate   GOALS: Goals reviewed with patient? Yes  SHORT TERM GOALS: Target date: 10/01/23  Pt will tolerate full aquatic sessions consistently without increase in pain and with improving function to demonstrate good toleration and effectiveness of intervention.  Baseline: Goal status: MET - 10/07/23  2.  Pt will report gaining pool access Baseline:  Goal status:  Met 10/28/23    LONG TERM GOALS: Target date: 11/12/23  Pt to improve on LEFS by at least 9 point to  demonstrate statistically significant Improvement in function. Baseline: 24/80 Goal status: Met (41/80) 11/15/23  2.  Pt will be indep with aquatic final HEP for continued management of condition Baseline:  Goal status: In progress 10/28/23; Met 11/15/23  3.  Pt will report decrease in right le numbness and tingling by 50% Baseline:  Goal status: In progress 10/28/23; Not met 11/15/23 (no change)  4.  Pt will report walking dog up to 3 x week to and from mailbox without SOB or excessive pain. Baseline:  Goal status: Met 10/28/23    PLAN:  PT FREQUENCY: 1x/week  PT DURATION: 1 week  PLANNED INTERVENTIONS: 97164- PT Re-evaluation, 97750- Physical Performance Testing, 97110-Therapeutic exercises, 97530- Therapeutic activity, 97112- Neuromuscular re-education, 97535- Self Care, 02859- Manual therapy, [phone]- Gait training, [id number]- Orthotic Initial, [phone]- Aquatic Therapy, [phone] (1-2 muscles), 20561 (3+ muscles)- Dry Needling, Patient/Family education, Balance training, Stair training, Taping, Joint mobilization, DME instructions, Cryotherapy, and Moist heat.  PLAN FOR NEXT SESSION: Re-cert 1x week x 1 for instruction on final aquatic HEP; DC   Ronal Kem) Sharlize Hoar MPT 11/15/23 8:24 AM [facility] [address] [place], [place], [address] Phone: [phone]   Fax:  [phone]    For all possible CPT codes, reference the Planned Interventions line above.     Check all conditions that are expected to impact treatment: {Conditions expected to impact treatment:Morbid obesity, Diabetes mellitus, and Musculoskeletal disorders   If treatment provided at initial evaluation, no treatment charged due to lack of authorization.

## 2023-11-16 ENCOUNTER — Ambulatory Visit (HOSPITAL_BASED_OUTPATIENT_CLINIC_OR_DEPARTMENT_OTHER): Payer: Self-pay | Admitting: Physical Therapy

## 2023-11-24 ENCOUNTER — Encounter (HOSPITAL_BASED_OUTPATIENT_CLINIC_OR_DEPARTMENT_OTHER): Payer: Self-pay | Admitting: Family Medicine

## 2023-11-24 ENCOUNTER — Ambulatory Visit (INDEPENDENT_AMBULATORY_CARE_PROVIDER_SITE_OTHER): Admitting: Family Medicine

## 2023-11-24 VITALS — BP 138/90 | HR 81 | Ht 61.0 in | Wt 234.0 lb

## 2023-11-24 DIAGNOSIS — E1169 Type 2 diabetes mellitus with other specified complication: Secondary | ICD-10-CM | POA: Diagnosis not present

## 2023-11-24 DIAGNOSIS — E1122 Type 2 diabetes mellitus with diabetic chronic kidney disease: Secondary | ICD-10-CM

## 2023-11-24 DIAGNOSIS — E785 Hyperlipidemia, unspecified: Secondary | ICD-10-CM

## 2023-11-24 DIAGNOSIS — T451X5A Adverse effect of antineoplastic and immunosuppressive drugs, initial encounter: Secondary | ICD-10-CM

## 2023-11-24 DIAGNOSIS — N1832 Chronic kidney disease, stage 3b: Secondary | ICD-10-CM

## 2023-11-24 DIAGNOSIS — Z0000 Encounter for general adult medical examination without abnormal findings: Secondary | ICD-10-CM | POA: Diagnosis not present

## 2023-11-24 DIAGNOSIS — G620 Drug-induced polyneuropathy: Secondary | ICD-10-CM

## 2023-11-24 DIAGNOSIS — Z7985 Long-term (current) use of injectable non-insulin antidiabetic drugs: Secondary | ICD-10-CM

## 2023-11-24 MED ORDER — LEVOCETIRIZINE DIHYDROCHLORIDE 5 MG PO TABS: 5.0000 mg | ORAL_TABLET | Freq: Every evening | ORAL | 3 refills | Status: AC

## 2023-11-24 MED ORDER — PREGABALIN 25 MG PO CAPS: 25.0000 mg | ORAL_CAPSULE | Freq: Two times a day (BID) | ORAL | 3 refills | Status: DC | PRN

## 2023-11-24 NOTE — Progress Notes (Signed)
 Subjective:   Tamara Osborne [birth year]  11/24/2023   CC: Chief Complaint  Patient presents with   Annual Exam    Patient is here today for her physical. Recently finished PT due to pain in knee. Still has some problems with feet, knee, and hip pain.    HPI: Tamara Osborne is a 47 y.o. female who presents for a routine health maintenance exam.  Labs collected at time of visit.   DIABETES MELLITUS: Tamara Osborne presents for the medical management of diabetes.  Current diabetes medication regimen: Semaglutide  2mg  subcutaneous weekly Patient is not adhering to a diabetic diet.  Patient is exercising regularly, states she attends a water aerobics class weekly  Patient is not checking BS regularly. Patient is not checking their feet regularly.  Denies polydipsia, polyphagia, polyuria, open wounds or ulcers on feet.  Lab Results  Component Value Date   HGBA1C 5.6 08/04/2023    Foot Exam: No foot exam found Lab Results  Component Value Date   LABMICR See below: 10/22/2023   LABMICR See below: 07/21/2023    Wt Readings from Last 3 Encounters:  11/24/23 106.1 kg  10/19/23 104.3 kg  10/06/23 103.9 kg    HYPERTENSION: Tamara Osborne presents for the medical management of hypertension.  Patient's current hypertension medication regimen is: amlodipine -valsartan  10-320mg   Patient is currently taking prescribed medications for HTN.  Patient is not regularly keeping a check on BP at home.  Adhering to low sodium diet: no Exercising Regularly: yes- states she attends a water aerobics class weekly  Denies headache, dizziness, CP, SHOB, vision changes.   BP Readings from Last 3 Encounters:  11/24/23 (!) 138/90  10/22/23 128/84  10/19/23 133/81    HEALTH SCREENINGS: - Vision Screening: up to date - Dental Visits: up to date - Pap smear: UTD- normal/negative April 2025  - Breast Exam: declined - STD Screening: Declined - Mammogram (40+): Ordered 07/22/23  -  Colonoscopy (45+): scheduled with GI for 12/14/23 - Bone Density (65+ or under 65 with predisposing conditions): Not applicable  - Lung CA screening with low-dose CT:  Not applicable Adults age 80-80 who are current cigarette smokers or quit within the last 15 years. Must have 20 pack year history.   Depression and Anxiety Screen done today and results listed below:     11/24/2023   10:11 AM 07/22/2023    9:40 AM 04/23/2023    9:07 AM 07/16/2022   10:49 AM 05/05/2022   10:18 AM  Depression screen PHQ 2/9  Decreased Interest 1 0 0 1 1  Down, Depressed, Hopeless 0 0 0 1 1  PHQ - 2 Score 1 0 0 2 2  Altered sleeping 0 0 0 2 3  Tired, decreased energy 3 0 0 1 2  Change in appetite 0 0 0 1 1  Feeling bad or failure about yourself  0 0 0 0 0  Trouble concentrating 2 0 0 2 2  Moving slowly or fidgety/restless 0 0 0 0 1  Suicidal thoughts 0 0 0 0 0  PHQ-9 Score 6 0 0 8 11  Difficult doing work/chores Not difficult at all Not difficult at all Not difficult at all Somewhat difficult       11/24/2023   10:13 AM 07/22/2023    9:40 AM 04/23/2023    9:07 AM 07/16/2022   10:50 AM  GAD 7 : Generalized Anxiety Score  Nervous, Anxious, on Edge 1 1 0 2  Control/stop worrying  1 1 0 1  Worry too much - different things 1 1 0 1  Trouble relaxing 0 0 0 1  Restless 0 0 0 1  Easily annoyed or irritable 1 1 0 1  Afraid - awful might happen 0 1 0 1  Total GAD 7 Score 4 5 0 8  Anxiety Difficulty Not difficult at all Not difficult at all Not difficult at all Somewhat difficult    IMMUNIZATIONS: - Tdap: Tetanus vaccination status reviewed: last tetanus booster within 10 years. - HPV: Not applicable - Influenza: Declined - Pneumovax: Not applicable - Prevnar 20: Not applicable - Shingrix (50+): Not applicable   Past medical history, surgical history, medications, allergies, family history and social history reviewed with patient today and changes made to appropriate areas of the chart.   Past Medical  History:  Diagnosis Date   Anemia    Angio-edema    Anxiety    Bartholin cyst    Cervical cancer (HCC)    Chronic kidney disease    Constipation    Depression    Deviated septum    Diabetes mellitus without complication (HCC)    Difficult intravenous access    used port for 05-09-2019 surgery   Fibroids    Genetic testing 10/11/2023   Negative CancerNext-Expanded +RNAinsight panel. The CancerNext-Expanded gene panel offered by [facility] and includes sequencing, rearrangement, and RNA analysis for the following 77 genes: AIP, ALK, APC, ATM, AXIN2, BAP1, BARD1, BMPR1A, BRCA1, BRCA2, BRIP1, CDC73, CDH1, CDK4, CDKN1B, CDKN2A, CEBPA, CHEK2, CTNNA1, DDX41, DICER1, EGFR, EPCAM, ETV6, FH, FLCN, GATA2, GREM1, HOXB13, KIT, LZTR1, MA   History of blood transfusion    2 units given 04-26-2019, has had total of 8 units since jan 2021   History of blood transfusion 1 unit given 05-30-19   iv fluids also given   History of cervical cancer 07/16/2022   History of kidney problems    History of radiation therapy 03/23/2019-05/04/2019   Cervical external beam   Dr Lynwood Nasuti   History of radiation therapy 05/09/2019-06/05/2019   vaginal brachytherapy   Dr Lynwood Nasuti   History of recent blood transfusion 05/16/2019   2 units given  per dr melrose at cancer center   Hypertension    Neuropathy    both thumbs   Obesity    Palpitations    PONV (postoperative nausea and vomiting)    Prediabetes    Preeclampsia 2006   Sleep apnea    no cpap used insurance would not cover, osa severe per pt   SOB (shortness of breath)    Squamous cell carcinoma of cervix (HCC) 03/23/2019   Swallowing difficulty    Symptomatic skin lesion 07/14/2022   Vitamin D  deficiency 01/10/2020    Past Surgical History:  Procedure Laterality Date   CESAREAN SECTION WITH BILATERAL TUBAL LIGATION  11/10/2004       DILATION AND CURETTAGE OF UTERUS  1997   IR IMAGING GUIDED PORT INSERTION  04/04/2019   IR REMOVAL TUN ACCESS W/  PORT W/O FL MOD SED  10/27/2021   OPERATIVE ULTRASOUND N/A 05/09/2019   Procedure: OPERATIVE ULTRASOUND;  Surgeon: Nasuti Lynwood, MD;  Location: [facility];  Service: Urology;  Laterality: N/A;   OPERATIVE ULTRASOUND N/A 05/15/2019   Procedure: OPERATIVE ULTRASOUND;  Surgeon: Nasuti Lynwood, MD;  Location: [facility];  Service: Urology;  Laterality: N/A;   OPERATIVE ULTRASOUND N/A 05/22/2019   Procedure: OPERATIVE ULTRASOUND;  Surgeon: Nasuti Lynwood, MD;  Location: [place];  Service: Urology;  Laterality: N/A;   OPERATIVE ULTRASOUND N/A 05/29/2019   Procedure: OPERATIVE ULTRASOUND;  Surgeon: Shannon Agent, MD;  Location: [facility];  Service: Urology;  Laterality: N/A;   OPERATIVE ULTRASOUND N/A 06/05/2019   Procedure: OPERATIVE ULTRASOUND;  Surgeon: Shannon Agent, MD;  Location: [facility];  Service: Urology;  Laterality: N/A;   TANDEM RING INSERTION N/A 05/09/2019   Procedure: TANDEM RING INSERTION;  Surgeon: Shannon Agent, MD;  Location: [facility];  Service: Urology;  Laterality: N/A;   TANDEM RING INSERTION N/A 05/15/2019   Procedure: TANDEM RING INSERTION;  Surgeon: Shannon Agent, MD;  Location: [facility];  Service: Urology;  Laterality: N/A;   TANDEM RING INSERTION N/A 05/22/2019   Procedure: TANDEM RING INSERTION;  Surgeon: Shannon Agent, MD;  Location: [facility];  Service: Urology;  Laterality: N/A;   TANDEM RING INSERTION N/A 05/29/2019   Procedure: TANDEM RING INSERTION;  Surgeon: Shannon Agent, MD;  Location: [facility];  Service: Urology;  Laterality: N/A;   TANDEM RING INSERTION N/A 06/05/2019   Procedure: TANDEM RING INSERTION;  Surgeon: Shannon Agent, MD;  Location: [facility];  Service: Urology;  Laterality: N/A;    Current Outpatient Medications on File Prior to Visit  Medication Sig   Accu-Chek Softclix Lancets lancets Use to  check blood sugar TID.   amLODipine -valsartan  (EXFORGE ) 10-320 MG tablet Take 1 tablet by mouth daily.   azelastine  (ASTELIN ) 0.1 % nasal spray Place 1 spray into both nostrils 2 (two) times daily as needed. Use in each nostril as directed   Blood Glucose Monitoring Suppl (ACCU-CHEK GUIDE) w/Device KIT Use to check blood sugar TID.   buPROPion (WELLBUTRIN XL) 150 MG 24 hr tablet Take 150 mg by mouth daily.   chlorhexidine  (HIBICLENS ) 4 % external liquid Apply topically daily as needed.   Cholecalciferol 50 MCG (2000 UT) TABS Take by mouth.   conjugated estrogens  (PREMARIN ) vaginal cream Place 1 Applicatorful vaginally every other day.   EPINEPHrine  (EPIPEN  2-PAK) 0.3 mg/0.3 mL IJ SOAJ injection Inject 0.3 mg into the muscle as needed for anaphylaxis.   fluticasone  (FLONASE ) 50 MCG/ACT nasal spray Place 2 sprays into both nostrils daily.   glucose blood (ACCU-CHEK GUIDE) test strip Use to check blood sugar TID.   ipratropium (ATROVENT ) 0.06 % nasal spray Place 2 sprays into both nostrils 3 (three) times daily as needed for rhinitis (drainage).   mupirocin  ointment (BACTROBAN ) 2 % Apply 1 Application topically 2 (two) times daily.   Na Sulfate-K Sulfate-Mg Sulfate concentrate (SUPREP BOWEL PREP KIT) 17.5-3.13-1.6 GM/177ML SOLN Take 1 kit (354 mLs total) by mouth as directed. For colonoscopy prep   RESTASIS 0.05 % ophthalmic emulsion 1 drop 2 (two) times daily.   Semaglutide , 2 MG/DOSE, 8 MG/3ML SOPN Inject 2 mg as directed once a week.   No current facility-administered medications on file prior to visit.    Allergies  Allergen Reactions   Bee Venom    Penicillins     Not sure a child allergy    Shellfish Allergy  Swelling    Seafood also any kind   Sulfa Antibiotics     Not sure a child allergy      Social History   Socioeconomic History   Marital status: Single    Spouse name: Not on file   Number of children: 4   Years of education: Not on file   Highest education level: Not on  file  Occupational History   Not on file  Tobacco Use   Smoking status: Never   Smokeless tobacco: Never  Vaping Use   Vaping status: Never Used  Substance and Sexual Activity   Alcohol use: No   Drug use: No   Sexual activity: Not Currently    Birth control/protection: Surgical  Other Topics Concern   Not on file  Social History Narrative   Lives with her boyfriend   Social Drivers of Health   Financial Resource Strain: High Risk (04/23/2023)   Overall Financial Resource Strain (CARDIA)    Difficulty of Paying Living Expenses: Very hard  Food Insecurity: No Food Insecurity (10/08/2022)   Hunger Vital Sign    Worried About Running Out of Food in the Last Year: Never true    Ran Out of Food in the Last Year: Never true  Transportation Needs: No Transportation Needs (10/08/2022)   PRAPARE - Administrator, Civil Service (Medical): No    Lack of Transportation (Non-Medical): No  Physical Activity: Insufficiently Active (10/08/2022)   Exercise Vital Sign    Days of Exercise per Week: 2 days    Minutes of Exercise per Session: 40 min  Stress: No Stress Concern Present (04/23/2023)   [employer] of Occupational Health - Occupational Stress Questionnaire    Feeling of Stress : Not at all  Social Connections: Socially Isolated (04/23/2023)   Social Connection and Isolation Panel    Frequency of Communication with Friends and Family: More than three times a week    Frequency of Social Gatherings with Friends and Family: Once a week    Attends Religious Services: Never    Database administrator or Organizations: No    Attends Banker Meetings: Never    Marital Status: Never married  Intimate Partner Violence: Not At Risk (04/23/2023)   Humiliation, Afraid, Rape, and Kick questionnaire    Fear of Current or Ex-Partner: No    Emotionally Abused: No    Physically Abused: No    Sexually Abused: No   Social History   Tobacco Use  Smoking Status Never   Smokeless Tobacco Never   Social History   Substance and Sexual Activity  Alcohol Use No    Family History  Problem Relation Age of Onset   Brain cancer Mother 75   Hypertension Mother    Depression Mother    Anxiety disorder Mother    Lung cancer Mother 59   Diabetes Father    Kidney disease Father    Kidney cancer Father 38   Heart attack Sister    Cervical cancer Sister 62   Heart failure Paternal Aunt    Heart attack Paternal Uncle    Cancer Maternal Grandmother        possibly lung cancer?   Kidney failure Maternal Grandfather    Leukemia Paternal Grandmother 51   Heart attack Paternal Grandfather    Brain cancer Paternal Cousin 96   Cervical cancer Maternal Cousin    Colon cancer Neg Hx    Esophageal cancer Neg Hx    Inflammatory bowel disease Neg Hx    Liver disease Neg Hx    Pancreatic cancer Neg Hx    Rectal cancer Neg Hx    Stomach cancer Neg Hx      ROS: Denies fever, fatigue, unexplained weight loss/gain, chest pain, SHOB, and palpitations. Denies neurological deficits, gastrointestinal or genitourinary complaints, and skin changes.   Objective:   Today's Vitals  11/24/23 1006 11/24/23 1105  BP: (!) 145/91 (!) 138/90  Pulse: 81   SpO2: 97%   Weight: 106.1 kg   Height: 5' 1 (1.549 m)     GENERAL APPEARANCE: Well-appearing, in NAD. Obese.   SKIN: Pink, warm and dry. Turgor normal. No rash, lesion, ulceration, or ecchymoses. Hair evenly distributed. Areas of hyperpigmentation to dorsal surface of bilateral feet near toes.  HEENT: HEAD: Normocephalic.  EYES: PERRLA. EOMI. Lids intact w/o defect. Sclera white, Conjunctiva pink w/o exudate.  EARS: External ear w/o redness, swelling, masses or lesions. EAC clear. TM's intact, translucent w/o bulging, appropriate landmarks visualized. Appropriate acuity to conversational tones.  NOSE: Septum midline w/o deformity. Nares patent, mucosa pink and non-inflamed w/o drainage. No sinus tenderness.  THROAT:  Uvula midline. Oropharynx clear. Tonsils non-inflamed w/o exudate. Oral mucosa pink and moist.  NECK: Supple, Trachea midline. Full ROM w/o pain or tenderness. No lymphadenopathy. Thyroid non-tender w/o enlargement or palpable masses.  RESPIRATORY: Chest wall symmetrical w/o masses. Respirations even and non-labored. Breath sounds clear to auscultation bilaterally. No wheezes, rales, rhonchi, or crackles. CARDIAC: S1, S2 present, regular rate and rhythm. No gallops, murmurs, rubs, or clicks. PMI w/o lifts, heaves, or thrills. No carotid bruits. Capillary refill <2 seconds. Peripheral pulses 2+ bilaterally. GI: Abdomen soft w/o distention. Normoactive bowel sounds. No palpable masses or tenderness. No guarding or rebound tenderness. Liver and spleen w/o tenderness or enlargement. No CVA tenderness.  MSK: Muscle tone and strength appropriate for age, w/o atrophy or abnormal movement.  EXTREMITIES: Active ROM intact, w/o tenderness, crepitus, or contracture. No obvious joint deformities or effusions. No clubbing, edema, or cyanosis.  NEUROLOGIC: CN's II-XII intact. Motor strength symmetrical with no obvious weakness. No sensory deficits. DTR's 2+ symmetric bilaterally. Steady, even gait.  PSYCH/MENTAL STATUS: Alert, oriented x 3. Cooperative, appropriate mood and affect.   Results for orders placed or performed in visit on 10/22/23  Microscopic Examination   Collection Time: 10/22/23 11:55 AM   Urine  Result Value Ref Range   WBC, UA 0-5 0 - 5 /hpf   RBC, Urine 3-10 (A) 0 - 2 /hpf   Epithelial Cells (non renal) 0-10 0 - 10 /hpf   Crystals Present (A) N/A   Crystal Type Calcium  Oxalate N/A   Bacteria, UA Few (A) None seen/Few  Urinalysis, Routine w reflex microscopic   Collection Time: 10/22/23 11:55 AM  Result Value Ref Range   Specific Gravity, UA 1.020 1.005 - 1.030   pH, UA 6.0 5.0 - 7.5   Color, UA Yellow Yellow   Appearance Ur Cloudy (A) Clear   Leukocytes,UA Negative Negative    Protein,UA 2+ (A) Negative/Trace   Glucose, UA Negative Negative   Ketones, UA Negative Negative   RBC, UA 1+ (A) Negative   Bilirubin, UA Negative Negative   Urobilinogen, Ur 0.2 0.2 - 1.0 mg/dL   Nitrite, UA Negative Negative   Microscopic Examination See below:     Assessment & Plan:  1. Annual physical exam (Primary) Discussed preventative screenings, vaccines, and healthy lifestyle with patient.  - CBC with Differential; Future - TSH; Future  2. Type 2 diabetes mellitus with stage 3b chronic kidney disease, without long-term current use of insulin  (HCC) Controlled. Last A1c 6.0% during May 2025. Pt encouraged low carb, low sugar diet.  - HgB A1c; Future  3. Morbid obesity (HCC) Encouraged healthy exercise and diet, continue with [facility].   4. Hyperlipidemia associated with type 2 diabetes mellitus (HCC) - Lipid  Profile; Future  5. Chronic kidney disease, stage 3b (HCC) GFR stable. Pt follows renal recommendations. She is followed by [facility].  - Comprehensive Metabolic Panel (CMET); Future  6. Peripheral neuropathy due to chemotherapy Discussed medication options, including low dose Lyrica . Pt is agreeable to this; advised pt to take at dinner time as it can cause drowsiness and she is already taking allergy  medication before bedtime too d/t drowsiness.  - pregabalin  (LYRICA ) 25 MG capsule; Take 1 capsule (25 mg total) by mouth 2 (two) times daily as needed (neuropathic pain).  Dispense: 30 capsule; Refill: 3   Orders Placed This Encounter  Procedures   CBC with Differential    Standing Status:   Future    Expiration Date:   11/23/2024   Lipid Profile    Standing Status:   Future    Expiration Date:   11/23/2024   HgB A1c    Standing Status:   Future    Expiration Date:   11/23/2024   Comprehensive Metabolic Panel (CMET)    Standing Status:   Future    Expiration Date:   11/23/2024   TSH    Standing Status:   Future    Expiration Date:    11/23/2024    PATIENT COUNSELING:  - Encouraged a healthy well-balanced diet. Patient may adjust caloric intake to maintain or achieve ideal body weight. May reduce intake of dietary saturated fat and total fat and have adequate dietary potassium and calcium  preferably from fresh fruits, vegetables, and low-fat dairy products.   - Advised to avoid cigarette smoking. - Discussed with the patient that most people either abstain from alcohol or drink within safe limits (<=14/week and <=4 drinks/occasion for males, <=7/weeks and <= 3 drinks/occasion for females) and that the risk for alcohol disorders and other health effects rises proportionally with the number of drinks per week and how often a drinker exceeds daily limits. - Discussed cessation/primary prevention of drug use and availability of treatment for abuse.  - Discussed sexually transmitted diseases, avoidance of unintended pregnancy and contraceptive alternatives.  - Stressed the importance of regular exercise - Injury prevention: Discussed safety belts, safety helmets, smoke detector, smoking near bedding or upholstery.  - Dental health: Discussed importance of regular tooth brushing, flossing, and dental visits.   NEXT PREVENTATIVE PHYSICAL DUE IN 1 YEAR.  Return in about 6 months (around 05/23/2024) for Follow up Chronic Conditions .  Patient to reach out to office if new, worrisome, or unresolved symptoms arise or if no improvement in patient's condition. Patient verbalized understanding and is agreeable to treatment plan. All questions answered to patient's satisfaction.   Treatment plan and recommendation(s) reviewed by supervising preceptor, Thersia CLEMENTEEN Stark, FNP-C, prior to clinic discharge.   Rosina Ada, BSN, RN  DNP Student

## 2023-11-24 NOTE — Progress Notes (Signed)
 Subjective:   Tamara Osborne 25-Dec-1975  11/24/2023   CC: Chief Complaint  Patient presents with   Annual Exam    Patient is here today for her physical. Recently finished PT due to pain in knee. Still has some problems with feet, knee, and hip pain.    HPI: Tamara Osborne is a 48 y.o. female who presents for a routine health maintenance exam.  Labs collected at time of visit.   DIABETES MELLITUS: Tamara Osborne presents for the medical management of diabetes.  Current diabetes medication regimen: Semaglutide  2mg  subcutaneous weekly Patient is not adhering to a diabetic diet.  Patient is exercising regularly, states she attends a water aerobics class weekly  Patient is not checking BS regularly. Patient is not checking their feet regularly.  Denies polydipsia, polyphagia, polyuria, open wounds or ulcers on feet.   Patient is requesting change in medication for peripheral neuropathy. She states the pain increases in the evening and nighttime impairing sleep. Reports no improvement with gabapentin  in the past.   Lab Results  Component Value Date   HGBA1C 5.6 08/04/2023    Foot Exam: No foot exam found Lab Results  Component Value Date   LABMICR See below: 10/22/2023   LABMICR See below: 07/21/2023    Wt Readings from Last 3 Encounters:  11/24/23 234 lb (106.1 kg)  10/19/23 230 lb (104.3 kg)  10/06/23 229 lb (103.9 kg)    HYPERTENSION: Tamara Osborne presents for the medical management of hypertension.  Patient's current hypertension medication regimen is: amlodipine -valsartan  10-320mg   Patient is currently taking prescribed medications for HTN.  Patient is not regularly keeping a check on BP at home.  Adhering to low sodium diet: no Exercising Regularly: yes- states she attends a water aerobics class weekly  Denies headache, dizziness, CP, SHOB, vision changes.   BP Readings from Last 3 Encounters:  11/24/23 (!) 138/90  10/22/23 128/84  10/19/23  133/81    HEALTH SCREENINGS: - Vision Screening: up to date - Dental Visits: up to date - Pap smear: UTD- normal/negative April 2025  - Breast Exam: declined - STD Screening: Declined - Mammogram (40+): Ordered 07/22/23  - Colonoscopy (45+): scheduled with GI for 12/14/23 - Bone Density (65+ or under 65 with predisposing conditions): Not applicable  - Lung CA screening with low-dose CT:  Not applicable Adults age 35-80 who are current cigarette smokers or quit within the last 15 years. Must have 20 pack year history.   Depression and Anxiety Screen done today and results listed below:     11/24/2023   10:11 AM 07/22/2023    9:40 AM 04/23/2023    9:07 AM 07/16/2022   10:49 AM 05/05/2022   10:18 AM  Depression screen PHQ 2/9  Decreased Interest 1 0 0 1 1  Down, Depressed, Hopeless 0 0 0 1 1  PHQ - 2 Score 1 0 0 2 2  Altered sleeping 0 0 0 2 3  Tired, decreased energy 3 0 0 1 2  Change in appetite 0 0 0 1 1  Feeling bad or failure about yourself  0 0 0 0 0  Trouble concentrating 2 0 0 2 2  Moving slowly or fidgety/restless 0 0 0 0 1  Suicidal thoughts 0 0 0 0 0  PHQ-9 Score 6 0 0 8 11  Difficult doing work/chores Not difficult at all Not difficult at all Not difficult at all Somewhat difficult       11/24/2023  10:13 AM 07/22/2023    9:40 AM 04/23/2023    9:07 AM 07/16/2022   10:50 AM  GAD 7 : Generalized Anxiety Score  Nervous, Anxious, on Edge 1 1 0 2  Control/stop worrying 1 1 0 1  Worry too much - different things 1 1 0 1  Trouble relaxing 0 0 0 1  Restless 0 0 0 1  Easily annoyed or irritable 1 1 0 1  Afraid - awful might happen 0 1 0 1  Total GAD 7 Score 4 5 0 8  Anxiety Difficulty Not difficult at all Not difficult at all Not difficult at all Somewhat difficult    IMMUNIZATIONS: - Tdap: Tetanus vaccination status reviewed: last tetanus booster within 10 years. - HPV: Not applicable - Influenza: Declined - Pneumovax: Not applicable - Prevnar 20: Not applicable -  Shingrix (50+): Not applicable   Past medical history, surgical history, medications, allergies, family history and social history reviewed with patient today and changes made to appropriate areas of the chart.   Past Medical History:  Diagnosis Date   Anemia    Angio-edema    Anxiety    Bartholin cyst    Cervical cancer (HCC)    Chronic kidney disease    Constipation    Depression    Deviated septum    Diabetes mellitus without complication (HCC)    Difficult intravenous access    used port for 05-09-2019 surgery   Fibroids    Genetic testing 10/11/2023   Negative CancerNext-Expanded +RNAinsight panel. The CancerNext-Expanded gene panel offered by [facility] and includes sequencing, rearrangement, and RNA analysis for the following 77 genes: AIP, ALK, APC, ATM, AXIN2, BAP1, BARD1, BMPR1A, BRCA1, BRCA2, BRIP1, CDC73, CDH1, CDK4, CDKN1B, CDKN2A, CEBPA, CHEK2, CTNNA1, DDX41, DICER1, EGFR, EPCAM, ETV6, FH, FLCN, GATA2, GREM1, HOXB13, KIT, LZTR1, MA   History of blood transfusion    2 units given 04-26-2019, has had total of 8 units since jan 2021   History of blood transfusion 1 unit given 05-30-19   iv fluids also given   History of cervical cancer 07/16/2022   History of kidney problems    History of radiation therapy 03/23/2019-05/04/2019   Cervical external beam   Dr Lynwood Nasuti   History of radiation therapy 05/09/2019-06/05/2019   vaginal brachytherapy   Dr Lynwood Nasuti   History of recent blood transfusion 05/16/2019   2 units given  per dr melrose at cancer center   Hypertension    Neuropathy    both thumbs   Obesity    Palpitations    PONV (postoperative nausea and vomiting)    Prediabetes    Preeclampsia 2006   Sleep apnea    no cpap used insurance would not cover, osa severe per pt   SOB (shortness of breath)    Squamous cell carcinoma of cervix (HCC) 03/23/2019   Swallowing difficulty    Symptomatic skin lesion 07/14/2022   Vitamin D  deficiency 01/10/2020    Past  Surgical History:  Procedure Laterality Date   CESAREAN SECTION WITH BILATERAL TUBAL LIGATION  11/10/2004       DILATION AND CURETTAGE OF UTERUS  1997   IR IMAGING GUIDED PORT INSERTION  04/04/2019   IR REMOVAL TUN ACCESS W/ PORT W/O FL MOD SED  10/27/2021   OPERATIVE ULTRASOUND N/A 05/09/2019   Procedure: OPERATIVE ULTRASOUND;  Surgeon: Nasuti Lynwood, MD;  Location: [facility];  Service: Urology;  Laterality: N/A;   OPERATIVE ULTRASOUND N/A 05/15/2019   Procedure:  OPERATIVE ULTRASOUND;  Surgeon: Shannon Agent, MD;  Location: [facility];  Service: Urology;  Laterality: N/A;   OPERATIVE ULTRASOUND N/A 05/22/2019   Procedure: OPERATIVE ULTRASOUND;  Surgeon: Shannon Agent, MD;  Location: [facility];  Service: Urology;  Laterality: N/A;   OPERATIVE ULTRASOUND N/A 05/29/2019   Procedure: OPERATIVE ULTRASOUND;  Surgeon: Shannon Agent, MD;  Location: [facility];  Service: Urology;  Laterality: N/A;   OPERATIVE ULTRASOUND N/A 06/05/2019   Procedure: OPERATIVE ULTRASOUND;  Surgeon: Shannon Agent, MD;  Location: [facility];  Service: Urology;  Laterality: N/A;   TANDEM RING INSERTION N/A 05/09/2019   Procedure: TANDEM RING INSERTION;  Surgeon: Shannon Agent, MD;  Location: [facility];  Service: Urology;  Laterality: N/A;   TANDEM RING INSERTION N/A 05/15/2019   Procedure: TANDEM RING INSERTION;  Surgeon: Shannon Agent, MD;  Location: [facility];  Service: Urology;  Laterality: N/A;   TANDEM RING INSERTION N/A 05/22/2019   Procedure: TANDEM RING INSERTION;  Surgeon: Shannon Agent, MD;  Location: [facility];  Service: Urology;  Laterality: N/A;   TANDEM RING INSERTION N/A 05/29/2019   Procedure: TANDEM RING INSERTION;  Surgeon: Shannon Agent, MD;  Location: [facility];  Service: Urology;  Laterality: N/A;   TANDEM RING INSERTION N/A 06/05/2019   Procedure: TANDEM RING  INSERTION;  Surgeon: Shannon Agent, MD;  Location: [facility];  Service: Urology;  Laterality: N/A;    Current Outpatient Medications on File Prior to Visit  Medication Sig   Accu-Chek Softclix Lancets lancets Use to check blood sugar TID.   amLODipine -valsartan  (EXFORGE ) 10-320 MG tablet Take 1 tablet by mouth daily.   azelastine  (ASTELIN ) 0.1 % nasal spray Place 1 spray into both nostrils 2 (two) times daily as needed. Use in each nostril as directed   Blood Glucose Monitoring Suppl (ACCU-CHEK GUIDE) w/Device KIT Use to check blood sugar TID.   buPROPion (WELLBUTRIN XL) 150 MG 24 hr tablet Take 150 mg by mouth daily.   chlorhexidine  (HIBICLENS ) 4 % external liquid Apply topically daily as needed.   Cholecalciferol 50 MCG (2000 UT) TABS Take by mouth.   conjugated estrogens  (PREMARIN ) vaginal cream Place 1 Applicatorful vaginally every other day.   EPINEPHrine  (EPIPEN  2-PAK) 0.3 mg/0.3 mL IJ SOAJ injection Inject 0.3 mg into the muscle as needed for anaphylaxis.   fluticasone  (FLONASE ) 50 MCG/ACT nasal spray Place 2 sprays into both nostrils daily.   glucose blood (ACCU-CHEK GUIDE) test strip Use to check blood sugar TID.   ipratropium (ATROVENT ) 0.06 % nasal spray Place 2 sprays into both nostrils 3 (three) times daily as needed for rhinitis (drainage).   mupirocin  ointment (BACTROBAN ) 2 % Apply 1 Application topically 2 (two) times daily.   Na Sulfate-K Sulfate-Mg Sulfate concentrate (SUPREP BOWEL PREP KIT) 17.5-3.13-1.6 GM/177ML SOLN Take 1 kit (354 mLs total) by mouth as directed. For colonoscopy prep   RESTASIS 0.05 % ophthalmic emulsion 1 drop 2 (two) times daily.   Semaglutide , 2 MG/DOSE, 8 MG/3ML SOPN Inject 2 mg as directed once a week.   No current facility-administered medications on file prior to visit.    Allergies  Allergen Reactions   Bee Venom    Penicillins     Not sure a child allergy    Shellfish Allergy  Swelling    Seafood also any kind   Sulfa  Antibiotics     Not sure a child allergy      Social History  Socioeconomic History   Marital status: Single    Spouse name: Not on file   Number of children: 4   Years of education: Not on file   Highest education level: Not on file  Occupational History   Not on file  Tobacco Use   Smoking status: Never   Smokeless tobacco: Never  Vaping Use   Vaping status: Never Used  Substance and Sexual Activity   Alcohol use: No   Drug use: No   Sexual activity: Not Currently    Birth control/protection: Surgical  Other Topics Concern   Not on file  Social History Narrative   Lives with her boyfriend   Social Drivers of Health   Financial Resource Strain: High Risk (04/23/2023)   Overall Financial Resource Strain (CARDIA)    Difficulty of Paying Living Expenses: Very hard  Food Insecurity: No Food Insecurity (10/08/2022)   Hunger Vital Sign    Worried About Running Out of Food in the Last Year: Never true    Ran Out of Food in the Last Year: Never true  Transportation Needs: No Transportation Needs (10/08/2022)   PRAPARE - Administrator, Civil Service (Medical): No    Lack of Transportation (Non-Medical): No  Physical Activity: Insufficiently Active (10/08/2022)   Exercise Vital Sign    Days of Exercise per Week: 2 days    Minutes of Exercise per Session: 40 min  Stress: No Stress Concern Present (04/23/2023)   [employer] of Occupational Health - Occupational Stress Questionnaire    Feeling of Stress : Not at all  Social Connections: Socially Isolated (04/23/2023)   Social Connection and Isolation Panel    Frequency of Communication with Friends and Family: More than three times a week    Frequency of Social Gatherings with Friends and Family: Once a week    Attends Religious Services: Never    Database administrator or Organizations: No    Attends Banker Meetings: Never    Marital Status: Never married  Intimate Partner Violence: Not At Risk  (04/23/2023)   Humiliation, Afraid, Rape, and Kick questionnaire    Fear of Current or Ex-Partner: No    Emotionally Abused: No    Physically Abused: No    Sexually Abused: No   Social History   Tobacco Use  Smoking Status Never  Smokeless Tobacco Never   Social History   Substance and Sexual Activity  Alcohol Use No    Family History  Problem Relation Age of Onset   Brain cancer Mother 68   Hypertension Mother    Depression Mother    Anxiety disorder Mother    Lung cancer Mother 57   Diabetes Father    Kidney disease Father    Kidney cancer Father 22   Heart attack Sister    Cervical cancer Sister 8   Heart failure Paternal Aunt    Heart attack Paternal Uncle    Cancer Maternal Grandmother        possibly lung cancer?   Kidney failure Maternal Grandfather    Leukemia Paternal Grandmother 34   Heart attack Paternal Grandfather    Brain cancer Paternal Cousin 59   Cervical cancer Maternal Cousin    Colon cancer Neg Hx    Esophageal cancer Neg Hx    Inflammatory bowel disease Neg Hx    Liver disease Neg Hx    Pancreatic cancer Neg Hx    Rectal cancer Neg Hx    Stomach cancer Neg  Hx      ROS: Denies fever, fatigue, unexplained weight loss/gain, chest pain, SHOB, and palpitations. Denies neurological deficits, gastrointestinal or genitourinary complaints, and skin changes.   Objective:   Today's Vitals   11/24/23 1006 11/24/23 1105  BP: (!) 145/91 (!) 138/90  Pulse: 81   SpO2: 97%   Weight: 234 lb (106.1 kg)   Height: 5' 1 (1.549 m)     GENERAL APPEARANCE: Well-appearing, in NAD. Obese.   SKIN: Pink, warm and dry. Turgor normal. No rash, lesion, ulceration, or ecchymoses. Hair evenly distributed. Areas of hyperpigmentation to dorsal surface of bilateral feet near toes.  HEENT: HEAD: Normocephalic.  EYES: PERRLA. EOMI. Lids intact w/o defect. Sclera white, Conjunctiva pink w/o exudate.  EARS: External ear w/o redness, swelling, masses or lesions. EAC  clear. TM's intact, translucent w/o bulging, appropriate landmarks visualized. Appropriate acuity to conversational tones.  NOSE: Septum midline w/o deformity. Nares patent, mucosa pink and non-inflamed w/o drainage. No sinus tenderness.  THROAT: Uvula midline. Oropharynx clear. Tonsils non-inflamed w/o exudate. Oral mucosa pink and moist.  NECK: Supple, Trachea midline. Full ROM w/o pain or tenderness. No lymphadenopathy. Thyroid non-tender w/o enlargement or palpable masses.  RESPIRATORY: Chest wall symmetrical w/o masses. Respirations even and non-labored. Breath sounds clear to auscultation bilaterally. No wheezes, rales, rhonchi, or crackles. CARDIAC: S1, S2 present, regular rate and rhythm. No gallops, murmurs, rubs, or clicks. PMI w/o lifts, heaves, or thrills. No carotid bruits. Capillary refill <2 seconds. Peripheral pulses 2+ bilaterally. GI: Abdomen soft w/o distention. Normoactive bowel sounds. No palpable masses or tenderness. No guarding or rebound tenderness. Liver and spleen w/o tenderness or enlargement. No CVA tenderness.  MSK: Muscle tone and strength appropriate for age, w/o atrophy or abnormal movement.  EXTREMITIES: Active ROM intact, w/o tenderness, crepitus, or contracture. No obvious joint deformities or effusions. No clubbing, edema, or cyanosis.  NEUROLOGIC: CN's II-XII intact. Motor strength symmetrical with no obvious weakness. No sensory deficits. DTR's 2+ symmetric bilaterally. Steady, even gait.  PSYCH/MENTAL STATUS: Alert, oriented x 3. Cooperative, appropriate mood and affect.    Assessment & Plan:  1. Annual physical exam (Primary) Discussed preventative screenings, vaccines, and healthy lifestyle with patient.  - CBC with Differential; Future - TSH; Future  2. Type 2 diabetes mellitus with stage 3b chronic kidney disease, without long-term current use of insulin  (HCC) Controlled. Last A1c 6.0% during May 2025. Pt encouraged low carb, low sugar diet. Continue  Semaglutide  prescribed by  [facility].  - HgB A1c; Future  3. Morbid obesity (HCC) Encouraged healthy exercise and diet, continue with [facility].   4. Hyperlipidemia associated with type 2 diabetes mellitus (HCC) Obtain LP with labs today. Discussed healthy habits, exercise, and heart healthy diet.  - Lipid Profile; Future  5. Chronic kidney disease, stage 3b (HCC) GFR stable. Pt follows renal recommendations. She is followed by [facility].  - Comprehensive Metabolic Panel (CMET); Future  6. Peripheral neuropathy due to chemotherapy Discussed medication options, including low dose Lyrica . Pt is agreeable to this; advised pt to take at dinner time as it can cause drowsiness and she is already taking allergy  medication before bedtime too d/t drowsiness. Will follow up with PCP if unable to tolerate. Safe use of medication reviewed. Will consider Cymbalta if no improvement.  - pregabalin  (LYRICA ) 25 MG capsule; Take 1 capsule (25 mg total) by mouth 2 (two) times daily as needed (neuropathic pain).  Dispense: 30 capsule; Refill: 3   Orders Placed This Encounter  Procedures   CBC with Differential    Standing Status:   Future    Expiration Date:   11/23/2024   Lipid Profile    Standing Status:   Future    Expiration Date:   11/23/2024   HgB A1c    Standing Status:   Future    Expiration Date:   11/23/2024   Comprehensive Metabolic Panel (CMET)    Standing Status:   Future    Expiration Date:   11/23/2024   TSH    Standing Status:   Future    Expiration Date:   11/23/2024    PATIENT COUNSELING:  - Encouraged a healthy well-balanced diet. Patient may adjust caloric intake to maintain or achieve ideal body weight. May reduce intake of dietary saturated fat and total fat and have adequate dietary potassium and calcium  preferably from fresh fruits, vegetables, and low-fat dairy products.   - Advised to avoid cigarette smoking. - Discussed with the patient that most people  either abstain from alcohol or drink within safe limits (<=14/week and <=4 drinks/occasion for males, <=7/weeks and <= 3 drinks/occasion for females) and that the risk for alcohol disorders and other health effects rises proportionally with the number of drinks per week and how often a drinker exceeds daily limits. - Discussed cessation/primary prevention of drug use and availability of treatment for abuse.  - Discussed sexually transmitted diseases, avoidance of unintended pregnancy and contraceptive alternatives.  - Stressed the importance of regular exercise - Injury prevention: Discussed safety belts, safety helmets, smoke detector, smoking near bedding or upholstery.  - Dental health: Discussed importance of regular tooth brushing, flossing, and dental visits.   NEXT PREVENTATIVE PHYSICAL DUE IN 1 YEAR.  Return in about 6 months (around 05/23/2024) for Follow up Chronic Conditions .  Patient to reach out to office if new, worrisome, or unresolved symptoms arise or if no improvement in patient's condition. Patient verbalized understanding and is agreeable to treatment plan. All questions answered to patient's satisfaction.   Thersia Stark, FNP- C

## 2023-11-24 NOTE — Patient Instructions (Addendum)
 Please call to schedule your mammogram.   Trial Pregabalin  (Lyrica ) 25mg  AS NEEDED for neuropathic pain. Do not drive/operate machinery before knowing how this medication affects you.     Please return for fasting blood work.  For fasting, if your blood work is in the morning please do not eat any food after midnight.  You may have water or black coffee prior to your lab work.  Please take all regularly prescribed medications even if you are fasting.  If your blood work is in the afternoon, please fast for at least 5 to 6 hours.  You may continue to drink water and/or black coffee prior to your lab work.  Please take all scheduled medications even if you are fasting.

## 2023-11-29 ENCOUNTER — Telehealth (INDEPENDENT_AMBULATORY_CARE_PROVIDER_SITE_OTHER): Admitting: Family Medicine

## 2023-11-29 ENCOUNTER — Encounter (INDEPENDENT_AMBULATORY_CARE_PROVIDER_SITE_OTHER): Payer: Self-pay | Admitting: Family Medicine

## 2023-11-29 VITALS — Ht 61.0 in

## 2023-11-29 DIAGNOSIS — E785 Hyperlipidemia, unspecified: Secondary | ICD-10-CM

## 2023-11-29 DIAGNOSIS — E1169 Type 2 diabetes mellitus with other specified complication: Secondary | ICD-10-CM

## 2023-11-29 DIAGNOSIS — N1832 Chronic kidney disease, stage 3b: Secondary | ICD-10-CM

## 2023-11-29 DIAGNOSIS — E1122 Type 2 diabetes mellitus with diabetic chronic kidney disease: Secondary | ICD-10-CM

## 2023-11-29 DIAGNOSIS — Z6841 Body Mass Index (BMI) 40.0 and over, adult: Secondary | ICD-10-CM

## 2023-11-29 MED ORDER — SEMAGLUTIDE (2 MG/DOSE) 8 MG/3ML ~~LOC~~ SOPN: 2.0000 mg | PEN_INJECTOR | SUBCUTANEOUS | 0 refills | Status: DC

## 2023-11-29 NOTE — Assessment & Plan Note (Signed)
 Patient is doing well on Ozempic  at current dose.  She is getting 4 doses in the pen and feels it is a good dose to control her intake and still allow her to meet her dietary goals.  Needs a refill today- will continue current dose and evaluate food journal in person at next appointment

## 2023-11-29 NOTE — Assessment & Plan Note (Signed)
 Most recent LDL over goal of 70.  Will need repeat labs in 2 months to evaluate cholesterol levels- may need to discuss medication options at that time.

## 2023-11-29 NOTE — Progress Notes (Signed)
 TeleHealth Visit:   Tamara Osborne has verbally consented to this TeleHealth visit. The patient is located at home, the provider is located at the [facility] office. The participants in this visit include the listed provider and patient. The visit was conducted today via MyChart.   Chief Complaint: OBESITY Tamara Osborne is here to discuss her progress with her obesity treatment plan along with follow-up of her obesity related diagnoses. Tamara Osborne is on keeping a food journal and adhering to recommended goals of 1600 calories and 95 grams of protein and states she is following her eating plan approximately 90% of the time. Tamara Osborne states she is exercising or walking 30-45 minutes 7 times per week.  No data recorded Anthropometric Measurements Height: 5' 1 (1.549 m) Weight at Last Visit: 230 lb Starting Weight: 208 lb   No data recorded Other Clinical Data Today's Visit #: 16 Starting Date: 09/20/19 Comments: 1600/95    Reported Weight:228  Subjective:  Patient is experiencing some stuffy nose and sore throat which she thinks may be from water issues in her CPAP (not enough water).  Pool therapy ended and she mentions she is awaiting approval from [facility] for continuing aqua therapy.  She has many upcoming appointments- mammogram, colonoscopy to name a few. She mentions she is doing very well with her food intake.  Sticking close to 1600 calories and getting close to 95 or more grams of therapy.  She feels like she has lost at least 3-4 lbs.  She is finding it relatively easy to get all her food in and all her protein in.  There are no diagnoses linked to this encounter. Assessment/Plan:   Assessment & Plan Hyperlipidemia associated with type 2 diabetes mellitus (HCC) Most recent LDL over goal of 70.  Will need repeat labs in 2 months to evaluate cholesterol levels- may need to discuss medication options at that time. Type 2 diabetes mellitus with stage 3b chronic kidney  disease, without long-term current use of insulin  [facility]) Patient is doing well on Ozempic  at current dose.  She is getting 4 doses in the pen and feels it is a good dose to control her intake and still allow her to meet her dietary goals.  Needs a refill today- will continue current dose and evaluate food journal in person at next appointment Morbid obesity (HCC)  BMI 40.0-44.9, adult (HCC), Current BMI 43.46    There are no diagnoses linked to this encounter.  Tamara Osborne is currently in the action stage of change. As such, her goal is to continue with weight loss efforts and has agreed to keeping a food journal and adhering to recommended goals of 1600 calories and 95 or more grams protein daily.   Exercise goals: For substantial health benefits, adults should do at least 150 minutes (2 hours and 30 minutes) a week of moderate-intensity, or 75 minutes (1 hour and 15 minutes) a week of vigorous-intensity aerobic physical activity, or an equivalent combination of moderate- and vigorous-intensity aerobic activity. Aerobic activity should be performed in episodes of at least 10 minutes, and preferably, it should be spread throughout the week.  Behavioral modification strategies: increasing lean protein intake, decreasing simple carbohydrates, increasing vegetables, meal planning and cooking strategies, planning for success, and keeping a strict food journal.  Tamara Osborne has agreed to follow-up with our clinic in 4 weeks. She was informed of the importance of frequent follow-up visits to maximize her success with intensive lifestyle modifications for her multiple health conditions.   Objective:  VITALS: Per patient if applicable, see vitals. GENERAL: Alert and in no acute distress. CARDIOPULMONARY: No increased WOB. Speaking in clear sentences.  PSYCH: Pleasant and cooperative. Speech normal rate and rhythm. Affect is appropriate. Insight and judgement are appropriate. Attention is focused, linear,  and appropriate.  NEURO: Oriented as arrived to appointment on time with no prompting.   Lab Results  Component Value Date   CREATININE 1.4 (A) 06/09/2023   BUN 23 (A) 06/09/2023   NA 140 06/09/2023   K 4.8 06/09/2023   CL 104 06/09/2023   CO2 20 08/06/2022   Lab Results  Component Value Date   ALT 48 (H) 08/06/2022   AST 27 08/06/2022   ALKPHOS 97 08/06/2022   BILITOT 0.3 08/06/2022   Lab Results  Component Value Date   HGBA1C 5.6 08/04/2023   HGBA1C 5.9 (H) 08/06/2022   HGBA1C 5.3 07/16/2022   HGBA1C 5.4 06/05/2021   HGBA1C 5.2 01/13/2021   Lab Results  Component Value Date   INSULIN  40.3 (H) 09/20/2019   Lab Results  Component Value Date   TSH 1.850 08/06/2022   Lab Results  Component Value Date   CHOL 213 (H) 08/04/2023   HDL 41 08/04/2023   LDLCALC 149 (H) 08/04/2023   TRIG 125 08/04/2023   CHOLHDL 5.2 (H) 08/04/2023   Lab Results  Component Value Date   VD25OH 66.26 06/05/2021   VD25OH 59.37 10/18/2020   VD25OH 45.60 05/20/2020   Lab Results  Component Value Date   WBC 9.3 08/06/2022   HGB 15.3 08/06/2022   HCT 45.3 08/06/2022   MCV 83 08/06/2022   PLT 326 08/06/2022   Lab Results  Component Value Date   IRON 17 (L) 03/27/2019   TIBC 283 03/27/2019   FERRITIN 32 03/27/2019    Attestation Statements:   Reviewed by clinician on day of visit: allergies, medications, problem list, medical history, surgical history, family history, social history, and previous encounter notes.   [name], MD

## 2023-12-09 NOTE — Progress Notes (Signed)
 Office Visit Note  Patient: Tamara Osborne             Date of Birth: [birth year]           MRN: [id number]             PCP: Knute Thersia Bitters, FNP Referring: Jerrye Katheryn BROCKS, MD Visit Date: 12/23/2023 Occupation: Data Unavailable  Subjective:  Positive ANA and joint pain  History of Present Illness: Tamara Osborne is a 48 y.o. female seen for the evaluation of positive ANA and arthralgias.  According to the patient she was diagnosed with cervical cancer in 2021.  She was treated with surgery, radiation therapy and chemotherapy.  She states she was in a wheelchair for about 3-1/2 months followed by on a walker for 6 months.  She states she felt very weak after she recovered from getting up from the wheelchair.  She started noticing more weakness on the right lower extremity.  She states she also had severe neuropathy and had several falls.  Gradually the neuropathy improved.  She still have residual numbness in her toes which gives her poor balance.  She states she continues to have pain and discomfort in her right hip which radiates down into her right knee.  She also has severe pain in her feet and was seen by podiatrist about 3 months ago.  She continues to have discomfort in her neck, shoulders, hands, hips, knees and her feet.  She also gives history of generalized muscle pain.  Her insomnia is better with CPAP.  She is right-handed, she used to work as a part-time census Art therapist.  Now she is going to [employer] and is sociology major.  She enjoys reading and doing puzzles.  She walks for exercise.  She is right-handed, single, gravida 5, para 4, miscarriage 1.  She had preeclampsia with her last pregnancy.  There is no history of DVTs.  She is a status post tubal ligation.  She does not drink any alcohol and never been a smoker.    Activities of Daily Living:  Patient reports morning stiffness for 30 minutes.   Patient Reports nocturnal pain.  Difficulty dressing/grooming:  Denies Difficulty climbing stairs: Reports Difficulty getting out of chair: Reports Difficulty using hands for taps, buttons, cutlery, and/or writing: Reports  Review of Systems  Constitutional:  Positive for fatigue.  HENT:  Negative for mouth sores and mouth dryness.   Eyes:  Positive for dryness.  Respiratory:  Positive for shortness of breath.   Cardiovascular:  Positive for swelling in legs/feet. Negative for chest pain and palpitations.  Gastrointestinal:  Negative for blood in stool, constipation and diarrhea.  Endocrine: Negative for increased urination.  Genitourinary:  Negative for involuntary urination.  Musculoskeletal:  Positive for joint pain, gait problem, joint pain, joint swelling, myalgias, muscle weakness, morning stiffness, muscle tenderness and myalgias.  Skin:  Positive for rash. Negative for color change, hair loss and sensitivity to sunlight.  Allergic/Immunologic: Positive for susceptible to infections.  Neurological:  Positive for numbness. Negative for dizziness and headaches.  Hematological:  Negative for swollen glands.  Psychiatric/Behavioral:  Positive for depressed mood. Negative for sleep disturbance. The patient is nervous/anxious.     PMFS History:  Patient Active Problem List   Diagnosis Date Noted   Pancreatic cyst 10/09/2023   Diverticulosis 10/09/2023   Hepatic steatosis 08/15/2023   Morbid obesity (HCC) 07/10/2023   History of radiation therapy 05/21/2023   Difficult intravenous access 05/21/2023  HSV-2 infection 07/13/2022   Dyspareunia in female 10/23/2021   Hyperlipidemia associated with type 2 diabetes mellitus (HCC) 07/03/2021   Type 2 diabetes mellitus with stage 3b chronic kidney disease, without long-term current use of insulin  (HCC) 11/15/2020   OSA (obstructive sleep apnea) 07/31/2020   Other hyperlipidemia 01/10/2020   Class 2 severe obesity with serious comorbidity and body mass index (BMI) of 39.0 to 39.9 in adult 01/10/2020    Peripheral neuropathy due to chemotherapy 04/25/2019   CKD (chronic kidney disease), symptom management only, stage 3 (moderate) (HCC) 03/30/2019   Generalized anxiety disorder 03/30/2019   Hypertension associated with type 2 diabetes mellitus (HCC)    Squamous cell carcinoma of cervix (HCC) 03/23/2019   Deficiency anemia 03/22/2019    Past Medical History:  Diagnosis Date   Anemia    Angio-edema    Anxiety    Arthritis    Bartholin cyst    Cervical cancer (HCC)    Chronic kidney disease    Constipation    Depression    Deviated septum    Diabetes mellitus without complication (HCC)    Difficult intravenous access    used port for 05-09-2019 surgery   Fibroids    Genetic testing 10/11/2023   Negative CancerNext-Expanded +RNAinsight panel. The CancerNext-Expanded gene panel offered by [facility] and includes sequencing, rearrangement, and RNA analysis for the following 77 genes: AIP, ALK, APC, ATM, AXIN2, BAP1, BARD1, BMPR1A, BRCA1, BRCA2, BRIP1, CDC73, CDH1, CDK4, CDKN1B, CDKN2A, CEBPA, CHEK2, CTNNA1, DDX41, DICER1, EGFR, EPCAM, ETV6, FH, FLCN, GATA2, GREM1, HOXB13, KIT, LZTR1, MA   History of blood transfusion    2 units given 04-26-2019, has had total of 8 units since jan 2021   History of blood transfusion 1 unit given 05-30-19   iv fluids also given   History of cervical cancer 07/16/2022   History of kidney problems    History of radiation therapy 03/23/2019-05/04/2019   Cervical external beam   Dr Lynwood Nasuti   History of radiation therapy 05/09/2019-06/05/2019   vaginal brachytherapy   Dr Lynwood Nasuti   History of recent blood transfusion 05/16/2019   2 units given  [facility] dr melrose [facility]   Hyperlipidemia    Hypertension    Neuropathy    both thumbs   Obesity    Palpitations    PONV (postoperative nausea and vomiting)    Prediabetes    Preeclampsia 2006   Sleep apnea    no cpap used insurance would not cover, osa severe per pt   SOB (shortness of breath)     Squamous cell carcinoma of cervix (HCC) 03/23/2019   Swallowing difficulty    Symptomatic skin lesion 07/14/2022   Vitamin D  deficiency 01/10/2020    Family History  Problem Relation Age of Onset   Brain cancer Mother 46   Hypertension Mother    Depression Mother    Anxiety disorder Mother    Lung cancer Mother 40   Diabetes Father    Kidney disease Father    Kidney cancer Father 8   Heart attack Sister    Cervical cancer Sister 89   Arthritis Sister    Heart failure Paternal Aunt    Heart attack Paternal Uncle    Cancer Maternal Grandmother        possibly lung cancer?   Kidney failure Maternal Grandfather    Leukemia Paternal Grandmother 10   Heart attack Paternal Grandfather    Brain cancer Paternal Cousin 12   Cervical cancer  Maternal Cousin    Tourette syndrome Son    Healthy Son    Healthy Daughter    Healthy Daughter    Colon cancer Neg Hx    Esophageal cancer Neg Hx    Inflammatory bowel disease Neg Hx    Liver disease Neg Hx    Pancreatic cancer Neg Hx    Rectal cancer Neg Hx    Stomach cancer Neg Hx    Past Surgical History:  Procedure Laterality Date   CESAREAN SECTION WITH BILATERAL TUBAL LIGATION  11/10/2004       COLONOSCOPY  12/14/2023   DILATION AND CURETTAGE OF UTERUS  1997   IR IMAGING GUIDED PORT INSERTION  04/04/2019   IR REMOVAL TUN ACCESS W/ PORT W/O FL MOD SED  10/27/2021   OPERATIVE ULTRASOUND N/A 05/09/2019   Procedure: OPERATIVE ULTRASOUND;  Surgeon: Shannon Agent, MD;  Location: [facility];  Service: Urology;  Laterality: N/A;   OPERATIVE ULTRASOUND N/A 05/15/2019   Procedure: OPERATIVE ULTRASOUND;  Surgeon: Shannon Agent, MD;  Location: [facility];  Service: Urology;  Laterality: N/A;   OPERATIVE ULTRASOUND N/A 05/22/2019   Procedure: OPERATIVE ULTRASOUND;  Surgeon: Shannon Agent, MD;  Location: [facility];  Service: Urology;  Laterality: N/A;   OPERATIVE ULTRASOUND N/A 05/29/2019    Procedure: OPERATIVE ULTRASOUND;  Surgeon: Shannon Agent, MD;  Location: [facility];  Service: Urology;  Laterality: N/A;   OPERATIVE ULTRASOUND N/A 06/05/2019   Procedure: OPERATIVE ULTRASOUND;  Surgeon: Shannon Agent, MD;  Location: [facility];  Service: Urology;  Laterality: N/A;   TANDEM RING INSERTION N/A 05/09/2019   Procedure: TANDEM RING INSERTION;  Surgeon: Shannon Agent, MD;  Location: [facility];  Service: Urology;  Laterality: N/A;   TANDEM RING INSERTION N/A 05/15/2019   Procedure: TANDEM RING INSERTION;  Surgeon: Shannon Agent, MD;  Location: [facility];  Service: Urology;  Laterality: N/A;   TANDEM RING INSERTION N/A 05/22/2019   Procedure: TANDEM RING INSERTION;  Surgeon: Shannon Agent, MD;  Location: [facility];  Service: Urology;  Laterality: N/A;   TANDEM RING INSERTION N/A 05/29/2019   Procedure: TANDEM RING INSERTION;  Surgeon: Shannon Agent, MD;  Location: [facility];  Service: Urology;  Laterality: N/A;   TANDEM RING INSERTION N/A 06/05/2019   Procedure: TANDEM RING INSERTION;  Surgeon: Shannon Agent, MD;  Location: [facility];  Service: Urology;  Laterality: N/A;   Social History   Tobacco Use   Smoking status: Never    Passive exposure: Past   Smokeless tobacco: Never  Vaping Use   Vaping status: Never Used  Substance Use Topics   Alcohol use: No   Drug use: No   Social History   Social History Narrative   Lives with her boyfriend      There is no immunization history on file for this patient.   Objective: Vital Signs: BP (!) 149/85 (BP Location: Right Arm, Patient Position: Sitting, Cuff Size: Large)   Pulse 76   Temp 98 F (36.7 C)   Resp 17   Ht 5' 1 (1.549 m)   Wt 231 lb 9.6 oz (105.1 kg)   LMP  (LMP Unknown)   BMI 43.76 kg/m    Physical Exam Vitals and nursing note reviewed.  Constitutional:      Appearance: She is  well-developed.  HENT:     Head: Normocephalic and atraumatic.  Eyes:  Conjunctiva/sclera: Conjunctivae normal.  Cardiovascular:     Rate and Rhythm: Normal rate and regular rhythm.     Heart sounds: Normal heart sounds.  Pulmonary:     Effort: Pulmonary effort is normal.     Breath sounds: Normal breath sounds.  Abdominal:     General: Bowel sounds are normal.     Palpations: Abdomen is soft.  Musculoskeletal:     Cervical back: Normal range of motion.  Lymphadenopathy:     Cervical: No cervical adenopathy.  Skin:    General: Skin is warm and dry.     Capillary Refill: Capillary refill takes less than 2 seconds.  Neurological:     Mental Status: She is alert and oriented to person, place, and time.  Psychiatric:        Behavior: Behavior normal.      Musculoskeletal Exam: Cervical, thoracic and lumbar spine were in good range of motion.  There was no SI joint tenderness.  Shoulder joints, elbow joints, wrist joints, MCPs, PIPs and DIPs were in good range of motion with no synovitis.  Hip joints and knee joints were in good range of motion without any warmth swelling or effusion.  She had discomfort range of motion of her right hip joint.  She had tenderness over right trochanteric region.  There was no tenderness over ankles or MTPs.   CDAI Exam: CDAI Score: -- Patient Global: --; Provider Global: -- Swollen: --; Tender: -- Joint Exam 12/23/2023   No joint exam has been documented for this visit   There is currently no information documented on the homunculus. Go to the Rheumatology activity and complete the homunculus joint exam.  Investigation: No additional findings.  Imaging: CT CARDIAC SCORING (SELF PAY ONLY) Addendum Date: 12/21/2023 ADDENDUM REPORT: 12/21/2023 15:14 EXAM: OVER-READ INTERPRETATION  CT CHEST The following report is an over-read performed by radiologist Dr. Andrea Gasman of [facility] on 12/21/2023. This over-read does not include  interpretation of cardiac or coronary anatomy or pathology. The coronary calcium  score interpretation by the cardiologist is attached. COMPARISON:  None. FINDINGS: Vascular: Aortic atherosclerosis. The included aorta is normal in caliber. Mediastinum/nodes: No adenopathy or mass.  Decompressed esophagus. Lungs: No focal airspace disease. No suspicious pulmonary nodule. No pleural fluid. The included airways are patent. Upper abdomen: No acute findings.  Diffuse hepatic steatosis. Musculoskeletal: There are no acute or suspicious osseous abnormalities. IMPRESSION: 1.  Aortic Atherosclerosis (ICD10-I70.0). 2. Hepatic steatosis. Electronically Signed   By: Andrea Gasman M.D.   On: 12/21/2023 15:14   Result Date: 12/21/2023 CLINICAL DATA:  Risk stratification: 48 Year-old Female EXAM: Coronary Calcium  Score TECHNIQUE: A gated, non-contrast computed tomography scan of the heart was performed using 2.5 mm slice thickness. Axial images were analyzed on a dedicated workstation. Calcium  scoring of the coronary arteries was performed using the Agatston method. FINDINGS: Coronary Calcium  Score: Left main: 0 Left anterior descending artery: 145 Left circumflex artery: 222 Right coronary artery: 20 Total: 387 Percentile: 99th Ascending Aorta: Normal caliber. Aortic atherosclerosis. Aortic Valve Calcium  score: 26 Mitral annular calcification: Minimal Pericardium: Normal. Non-cardiac: See separate report from [facility]. IMPRESSION: 1. Coronary calcium  score of 387. This was 99th percentile for age-, race-, and sex-matched controls. RECOMMENDATIONS: Coronary artery calcium  (CAC) score is a strong predictor of incident coronary heart disease (CHD) and provides predictive information beyond traditional risk factors. CAC scoring is reasonable to use in the decision to withhold, postpone, or initiate statin therapy in intermediate-risk or selected borderline-risk asymptomatic adults (  age 34-75 years and LDL-C >=70 to  <190 mg/dL) who do not have diabetes or established atherosclerotic cardiovascular disease (ASCVD).* In intermediate-risk (10-year ASCVD risk >=7.5% to <20%) adults or selected borderline-risk (10-year ASCVD risk >=5% to <7.5%) adults in whom a CAC score is measured for the purpose of making a treatment decision the following recommendations have been made: If CAC=0, it is reasonable to withhold statin therapy and reassess in 5 to 10 years, as long as higher risk conditions are absent (diabetes mellitus, family history of premature CHD in first degree relatives (males <55 years; females <65 years), cigarette smoking, or LDL >=190 mg/dL). If CAC is 1 to 99, it is reasonable to initiate statin therapy for patients >=66 years of age. If CAC is >=100 or >=75th percentile, it is reasonable to initiate statin therapy at any age. Cardiology referral should be considered for patients with CAC scores >=400 or >=75th percentile. *2018 AHA/ACC/AACVPR/AAPA/ABC/ACPM/ADA/AGS/APhA/ASPC/NLA/PCNA Guideline on the Management of Blood Cholesterol: A Report of the American College of Cardiology/American Heart Association Task Force on Clinical Practice Guidelines. J Am Coll Cardiol. 2019;73(24):3168-3209. Stanly Leavens, MD Electronically Signed: By: Stanly Leavens M.D. On: 12/20/2023 16:02    Recent Labs: Lab Results  Component Value Date   WBC 9.3 08/06/2022   HGB 15.3 08/06/2022   PLT 326 08/06/2022   NA 140 06/09/2023   K 4.8 06/09/2023   CL 104 06/09/2023   CO2 20 08/06/2022   GLUCOSE 94 08/06/2022   BUN 23 (A) 06/09/2023   CREATININE 1.4 (A) 06/09/2023   BILITOT 0.3 08/06/2022   ALKPHOS 97 08/06/2022   AST 27 08/06/2022   ALT 48 (H) 08/06/2022   PROT 7.8 08/06/2022   ALBUMIN 4.4 12/09/2022   CALCIUM  9.4 06/09/2023   GFRAA 55 (L) 09/20/2019   June 09, 2023 magnesium  2.0, urine protein creatinine ratio 1152, UA 2+ protein, positive WBCs, positive bacteria, PTH 27, ANA 1: 80 speckled , C3 173,  C4 25, MPO antibody negative, PR-3 antibody negative, BMP glucose 224, creatinine 1.37, GFR 48, hemoglobin 14.5  Speciality Comments: No specialty comments available.  Procedures:  No procedures performed Allergies: Shellfish allergy , Bee venom, Penicillins, and Sulfa antibiotics   Assessment / Plan:     Visit Diagnoses: Positive ANA (antinuclear antibody)-her recent labs showed positive ANA.  ANA was low titer and the complements were normal.  Presence of ANA in 15% of normal population was discussed.  Patient denies any history of oral ulcers, nasal ulcers, malar rash, photosensitivity, Raynaud's or lymphadenopathy.  There is no history of inflammatory arthritis.  To complete the workup I will check the ENA panel.  Polyarthralgia-she complains of pain and discomfort in multiple joints including her neck, shoulders, hands, hips, knees and her feet.  She states the main joint which is uncomfortable is her right hip.  Pain in right hip -she had good range of motion of her right hip joint with discomfort.  She also had tenderness over right trochanteric bursa.  Plan: XR HIP UNILAT W OR W/O PELVIS 2-3 VIEWS RIGHT.  X-rays of the right hip joint were unremarkable.  Trochanteric bursitis, right hip-I believe most of her symptoms are coming from right trochanteric bursitis.  Patient states she has tried physical therapy in the past.  I will give her a handout on exercises.  Benefits of water aerobics and swimming were discussed.  Myalgia-she gives history of pain in her muscles for many years.  She gives history of generalized muscle pain and muscle  weakness.  No muscle weakness was noted on the examination today.  Peripheral neuropathy due to chemotherapy-she has peripheral neuropathy since she had chemotherapy.  She states the neuropathy has improved but she still have residual neuropathy.  Other medical problems are listed as follows:  Type 2 diabetes mellitus with stage 3b chronic kidney  disease, without long-term current use of insulin  (HCC)  CKD (chronic kidney disease), symptom management only, stage 3 (moderate) (HCC)  Proteinuria-patient has significant proteinuria.  She is followed by nephrology.  I do not have records available to review.  Hypertension associated with type 2 diabetes mellitus (HCC)  Hyperlipidemia associated with type 2 diabetes mellitus (HCC)  Diverticulosis  Hepatic steatosis-she has elevated LFTs.  Squamous cell carcinoma of cervix (HCC) - dxd 01/21, RTX, surgery, CTX.  History of radiation therapy  HSV-2 infection  Generalized anxiety disorder  OSA (obstructive sleep apnea)  Class 2 severe obesity due to excess calories with serious comorbidity and body mass index (BMI) of 39.0 to 39.9 in adult  Orders: Orders Placed This Encounter  Procedures   XR HIP UNILAT W OR W/O PELVIS 2-3 VIEWS RIGHT   No orders of the defined types were placed in this encounter.    Follow-Up Instructions: Return for Positive ANA.   Maya Nash, MD  Note - This record has been created using Animal nutritionist.  Chart creation errors have been sought, but may not always  have been located. Such creation errors do not reflect on  the standard of medical care.

## 2023-12-12 ENCOUNTER — Other Ambulatory Visit: Payer: Self-pay | Admitting: Medical Genetics

## 2023-12-14 ENCOUNTER — Ambulatory Visit: Admitting: Gastroenterology

## 2023-12-14 ENCOUNTER — Encounter: Payer: Self-pay | Admitting: Gastroenterology

## 2023-12-14 VITALS — BP 112/62 | HR 87 | Temp 98.8°F | Resp 26 | Ht 61.0 in | Wt 229.0 lb

## 2023-12-14 DIAGNOSIS — D127 Benign neoplasm of rectosigmoid junction: Secondary | ICD-10-CM | POA: Diagnosis not present

## 2023-12-14 DIAGNOSIS — K5140 Inflammatory polyps of colon without complications: Secondary | ICD-10-CM

## 2023-12-14 DIAGNOSIS — K641 Second degree hemorrhoids: Secondary | ICD-10-CM

## 2023-12-14 DIAGNOSIS — K5730 Diverticulosis of large intestine without perforation or abscess without bleeding: Secondary | ICD-10-CM

## 2023-12-14 DIAGNOSIS — Z1211 Encounter for screening for malignant neoplasm of colon: Secondary | ICD-10-CM

## 2023-12-14 DIAGNOSIS — K635 Polyp of colon: Secondary | ICD-10-CM

## 2023-12-14 DIAGNOSIS — D123 Benign neoplasm of transverse colon: Secondary | ICD-10-CM

## 2023-12-14 DIAGNOSIS — K862 Cyst of pancreas: Secondary | ICD-10-CM

## 2023-12-14 DIAGNOSIS — D122 Benign neoplasm of ascending colon: Secondary | ICD-10-CM

## 2023-12-14 DIAGNOSIS — D125 Benign neoplasm of sigmoid colon: Secondary | ICD-10-CM | POA: Diagnosis not present

## 2023-12-14 DIAGNOSIS — D128 Benign neoplasm of rectum: Secondary | ICD-10-CM

## 2023-12-14 HISTORY — PX: COLONOSCOPY: SHX174

## 2023-12-14 MED ORDER — SODIUM CHLORIDE 0.9 % IV SOLN: 500.0000 mL | Freq: Once | INTRAVENOUS | Status: AC

## 2023-12-14 NOTE — Progress Notes (Signed)
 Pt's states no medical or surgical changes since previsit or office visit.

## 2023-12-14 NOTE — Progress Notes (Signed)
 To pacu, VSS. Report to [email or web address]

## 2023-12-14 NOTE — Progress Notes (Signed)
 Called to room to assist during endoscopic procedure.  Patient ID and intended procedure confirmed with present staff. Received instructions for my participation in the procedure from the performing physician.

## 2023-12-14 NOTE — Patient Instructions (Signed)
-  Handout on polyps, diverticulosis and hemorrhoids provided -Await pathology results  YOU HAD AN ENDOSCOPIC PROCEDURE TODAY AT THE [facility]:   Refer to the procedure report that was given to you for any specific questions about what was found during the examination.  If the procedure report does not answer your questions, please call your gastroenterologist to clarify.  If you requested that your care partner not be given the details of your procedure findings, then the procedure report has been included in a sealed envelope for you to review at your convenience later.  YOU SHOULD EXPECT: Some feelings of bloating in the abdomen. Passage of more gas than usual.  Walking can help get rid of the air that was put into your GI tract during the procedure and reduce the bloating. If you had a lower endoscopy (such as a colonoscopy or flexible sigmoidoscopy) you may notice spotting of blood in your stool or on the toilet paper. If you underwent a bowel prep for your procedure, you may not have a normal bowel movement for a few days.  Please Note:  You might notice some irritation and congestion in your nose or some drainage.  This is from the oxygen used during your procedure.  There is no need for concern and it should clear up in a day or so.  SYMPTOMS TO REPORT IMMEDIATELY:  Following lower endoscopy (colonoscopy or flexible sigmoidoscopy):  Excessive amounts of blood in the stool  Significant tenderness or worsening of abdominal pains  Swelling of the abdomen that is new, acute  Fever of 100F or higher  For urgent or emergent issues, a gastroenterologist can be reached at any hour by calling [phone]. Do not use MyChart messaging for urgent concerns.    DIET:  We do recommend a small meal at first, but then you may proceed to your regular diet.  Drink plenty of fluids but you should avoid alcoholic beverages for 24 hours.  ACTIVITY:  You should plan to take it easy for the  rest of today and you should NOT DRIVE or use heavy machinery until tomorrow (because of the sedation medicines used during the test).    FOLLOW UP: Our staff will call the number listed on your records the next business day following your procedure.  We will call around 7:15- 8:00 am to check on you and address any questions or concerns that you may have regarding the information given to you following your procedure. If we do not reach you, we will leave a message.     If any biopsies were taken you will be contacted by phone or by letter within the next 1-3 weeks.  Please call us  at [phone] if you have not heard about the biopsies in 3 weeks.    SIGNATURES/CONFIDENTIALITY: You and/or your care partner have signed paperwork which will be entered into your electronic medical record.  These signatures attest to the fact that that the information above on your After Visit Summary has been reviewed and is understood.  Full responsibility of the confidentiality of this discharge information lies with you and/or your care-partner.

## 2023-12-14 NOTE — Progress Notes (Signed)
 GASTROENTEROLOGY PROCEDURE H&P NOTE   Primary Care Physician: Knute Thersia Bitters, FNP  HPI: Tamara Osborne is a 48 y.o. female who presents for Colonoscopy for screening.  Past Medical History:  Diagnosis Date   Anemia    Angio-edema    Anxiety    Bartholin cyst    Cervical cancer (HCC)    Chronic kidney disease    Constipation    Depression    Deviated septum    Diabetes mellitus without complication (HCC)    Difficult intravenous access    used port for 05-09-2019 surgery   Fibroids    Genetic testing 10/11/2023   Negative CancerNext-Expanded +RNAinsight panel. The CancerNext-Expanded gene panel offered by [facility] and includes sequencing, rearrangement, and RNA analysis for the following 77 genes: AIP, ALK, APC, ATM, AXIN2, BAP1, BARD1, BMPR1A, BRCA1, BRCA2, BRIP1, CDC73, CDH1, CDK4, CDKN1B, CDKN2A, CEBPA, CHEK2, CTNNA1, DDX41, DICER1, EGFR, EPCAM, ETV6, FH, FLCN, GATA2, GREM1, HOXB13, KIT, LZTR1, MA   History of blood transfusion    2 units given 04-26-2019, has had total of 8 units since jan 2021   History of blood transfusion 1 unit given 05-30-19   iv fluids also given   History of cervical cancer 07/16/2022   History of kidney problems    History of radiation therapy 03/23/2019-05/04/2019   Cervical external beam   Dr Lynwood Nasuti   History of radiation therapy 05/09/2019-06/05/2019   vaginal brachytherapy   Dr Lynwood Nasuti   History of recent blood transfusion 05/16/2019   2 units given  per dr melrose at [facility]   Hypertension    Neuropathy    both thumbs   Obesity    Palpitations    PONV (postoperative nausea and vomiting)    Prediabetes    Preeclampsia 2006   Sleep apnea    no cpap used insurance would not cover, osa severe per pt   SOB (shortness of breath)    Squamous cell carcinoma of cervix (HCC) 03/23/2019   Swallowing difficulty    Symptomatic skin lesion 07/14/2022   Vitamin D  deficiency 01/10/2020   Past Surgical History:   Procedure Laterality Date   CESAREAN SECTION WITH BILATERAL TUBAL LIGATION  11/10/2004       DILATION AND CURETTAGE OF UTERUS  1997   IR IMAGING GUIDED PORT INSERTION  04/04/2019   IR REMOVAL TUN ACCESS W/ PORT W/O FL MOD SED  10/27/2021   OPERATIVE ULTRASOUND N/A 05/09/2019   Procedure: OPERATIVE ULTRASOUND;  Surgeon: Nasuti Lynwood, MD;  Location: [facility];  Service: Urology;  Laterality: N/A;   OPERATIVE ULTRASOUND N/A 05/15/2019   Procedure: OPERATIVE ULTRASOUND;  Surgeon: Nasuti Lynwood, MD;  Location: [facility];  Service: Urology;  Laterality: N/A;   OPERATIVE ULTRASOUND N/A 05/22/2019   Procedure: OPERATIVE ULTRASOUND;  Surgeon: Nasuti Lynwood, MD;  Location: [facility];  Service: Urology;  Laterality: N/A;   OPERATIVE ULTRASOUND N/A 05/29/2019   Procedure: OPERATIVE ULTRASOUND;  Surgeon: Nasuti Lynwood, MD;  Location: [facility];  Service: Urology;  Laterality: N/A;   OPERATIVE ULTRASOUND N/A 06/05/2019   Procedure: OPERATIVE ULTRASOUND;  Surgeon: Nasuti Lynwood, MD;  Location: [facility];  Service: Urology;  Laterality: N/A;   TANDEM RING INSERTION N/A 05/09/2019   Procedure: TANDEM RING INSERTION;  Surgeon: Nasuti Lynwood, MD;  Location: [facility];  Service: Urology;  Laterality: N/A;   TANDEM RING INSERTION N/A 05/15/2019   Procedure: TANDEM RING INSERTION;  Surgeon:  Shannon Agent, MD;  Location: [facility];  Service: Urology;  Laterality: N/A;   TANDEM RING INSERTION N/A 05/22/2019   Procedure: TANDEM RING INSERTION;  Surgeon: Shannon Agent, MD;  Location: [facility];  Service: Urology;  Laterality: N/A;   TANDEM RING INSERTION N/A 05/29/2019   Procedure: TANDEM RING INSERTION;  Surgeon: Shannon Agent, MD;  Location: [facility];  Service: Urology;  Laterality: N/A;   TANDEM RING INSERTION N/A 06/05/2019   Procedure: TANDEM RING INSERTION;  Surgeon:  Shannon Agent, MD;  Location: [facility];  Service: Urology;  Laterality: N/A;   Current Outpatient Medications  Medication Sig Dispense Refill   Accu-Chek Softclix Lancets lancets Use to check blood sugar TID. 100 each 2   amLODipine -valsartan  (EXFORGE ) 10-320 MG tablet Take 1 tablet by mouth daily. 90 tablet 3   azelastine  (ASTELIN ) 0.1 % nasal spray Place 1 spray into both nostrils 2 (two) times daily as needed. Use in each nostril as directed 30 mL 5   Blood Glucose Monitoring Suppl (ACCU-CHEK GUIDE) w/Device KIT Use to check blood sugar TID. 1 kit 0   buPROPion (WELLBUTRIN XL) 150 MG 24 hr tablet Take 150 mg by mouth daily.     Cholecalciferol 50 MCG (2000 UT) TABS Take by mouth.     conjugated estrogens  (PREMARIN ) vaginal cream Place 1 Applicatorful vaginally every other day. 42.5 g 12   EPINEPHrine  (EPIPEN  2-PAK) 0.3 mg/0.3 mL IJ SOAJ injection Inject 0.3 mg into the muscle as needed for anaphylaxis. 2 each 1   fluticasone  (FLONASE ) 50 MCG/ACT nasal spray Place 2 sprays into both nostrils daily. 16 g 5   glucose blood (ACCU-CHEK GUIDE) test strip Use to check blood sugar TID. 100 each 2   ipratropium (ATROVENT ) 0.06 % nasal spray Place 2 sprays into both nostrils 3 (three) times daily as needed for rhinitis (drainage). 15 mL 5   levocetirizine (XYZAL ) 5 MG tablet Take 1 tablet (5 mg total) by mouth every evening. 90 tablet 3   mupirocin  ointment (BACTROBAN ) 2 % Apply 1 Application topically 2 (two) times daily. 60 g 0   Na Sulfate-K Sulfate-Mg Sulfate concentrate (SUPREP BOWEL PREP KIT) 17.5-3.13-1.6 GM/177ML SOLN Take 1 kit (354 mLs total) by mouth as directed. For colonoscopy prep (Patient not taking: Reported on 11/29/2023) 354 mL 0   pregabalin  (LYRICA ) 25 MG capsule Take 1 capsule (25 mg total) by mouth 2 (two) times daily as needed (neuropathic pain). 30 capsule 3   RESTASIS 0.05 % ophthalmic emulsion 1 drop 2 (two) times daily.     Semaglutide , 2 MG/DOSE, 8 MG/3ML  SOPN Inject 2 mg as directed once a week. 3 mL 0   Current Facility-Administered Medications  Medication Dose Route Frequency Provider Last Rate Last Admin   0.9 %  sodium chloride  infusion  500 mL Intravenous Once Mansouraty, Kerri-Anne Haeberle Jr., MD        Current Outpatient Medications:    Accu-Chek Softclix Lancets lancets, Use to check blood sugar TID., Disp: 100 each, Rfl: 2   amLODipine -valsartan  (EXFORGE ) 10-320 MG tablet, Take 1 tablet by mouth daily., Disp: 90 tablet, Rfl: 3   azelastine  (ASTELIN ) 0.1 % nasal spray, Place 1 spray into both nostrils 2 (two) times daily as needed. Use in each nostril as directed, Disp: 30 mL, Rfl: 5   Blood Glucose Monitoring Suppl (ACCU-CHEK GUIDE) w/Device KIT, Use to check blood sugar TID., Disp: 1 kit, Rfl: 0   buPROPion (WELLBUTRIN XL) 150 MG  24 hr tablet, Take 150 mg by mouth daily., Disp: , Rfl:    Cholecalciferol 50 MCG (2000 UT) TABS, Take by mouth., Disp: , Rfl:    conjugated estrogens  (PREMARIN ) vaginal cream, Place 1 Applicatorful vaginally every other day., Disp: 42.5 g, Rfl: 12   EPINEPHrine  (EPIPEN  2-PAK) 0.3 mg/0.3 mL IJ SOAJ injection, Inject 0.3 mg into the muscle as needed for anaphylaxis., Disp: 2 each, Rfl: 1   fluticasone  (FLONASE ) 50 MCG/ACT nasal spray, Place 2 sprays into both nostrils daily., Disp: 16 g, Rfl: 5   glucose blood (ACCU-CHEK GUIDE) test strip, Use to check blood sugar TID., Disp: 100 each, Rfl: 2   ipratropium (ATROVENT ) 0.06 % nasal spray, Place 2 sprays into both nostrils 3 (three) times daily as needed for rhinitis (drainage)., Disp: 15 mL, Rfl: 5   levocetirizine (XYZAL ) 5 MG tablet, Take 1 tablet (5 mg total) by mouth every evening., Disp: 90 tablet, Rfl: 3   mupirocin  ointment (BACTROBAN ) 2 %, Apply 1 Application topically 2 (two) times daily., Disp: 60 g, Rfl: 0   Na Sulfate-K Sulfate-Mg Sulfate concentrate (SUPREP BOWEL PREP KIT) 17.5-3.13-1.6 GM/177ML SOLN, Take 1 kit (354 mLs total) by mouth as directed. For  colonoscopy prep (Patient not taking: Reported on 11/29/2023), Disp: 354 mL, Rfl: 0   pregabalin  (LYRICA ) 25 MG capsule, Take 1 capsule (25 mg total) by mouth 2 (two) times daily as needed (neuropathic pain)., Disp: 30 capsule, Rfl: 3   RESTASIS 0.05 % ophthalmic emulsion, 1 drop 2 (two) times daily., Disp: , Rfl:    Semaglutide , 2 MG/DOSE, 8 MG/3ML SOPN, Inject 2 mg as directed once a week., Disp: 3 mL, Rfl: 0  Current Facility-Administered Medications:    0.9 %  sodium chloride  infusion, 500 mL, Intravenous, Once, Mansouraty, Aloha Raddle., MD Allergies  Allergen Reactions   Shellfish Allergy  Swelling    Seafood also any kind   Bee Venom Other (See Comments)   Penicillins Other (See Comments)    Not sure a child allergy    Sulfa Antibiotics Other (See Comments)    Not sure a child allergy    Family History  Problem Relation Age of Onset   Brain cancer Mother 84   Hypertension Mother    Depression Mother    Anxiety disorder Mother    Lung cancer Mother 46   Diabetes Father    Kidney disease Father    Kidney cancer Father 28   Heart attack Sister    Cervical cancer Sister 45   Heart failure Paternal Aunt    Heart attack Paternal Uncle    Cancer Maternal Grandmother        possibly lung cancer?   Kidney failure Maternal Grandfather    Leukemia Paternal Grandmother 64   Heart attack Paternal Grandfather    Brain cancer Paternal Cousin 67   Cervical cancer Maternal Cousin    Colon cancer Neg Hx    Esophageal cancer Neg Hx    Inflammatory bowel disease Neg Hx    Liver disease Neg Hx    Pancreatic cancer Neg Hx    Rectal cancer Neg Hx    Stomach cancer Neg Hx    Social History   Socioeconomic History   Marital status: Single    Spouse name: Not on file   Number of children: 4   Years of education: Not on file   Highest education level: Not on file  Occupational History   Not on file  Tobacco Use   Smoking status: Never  Smokeless tobacco: Never  Vaping Use   Vaping  status: Never Used  Substance and Sexual Activity   Alcohol use: No   Drug use: No   Sexual activity: Not Currently    Birth control/protection: Surgical  Other Topics Concern   Not on file  Social History Narrative   Lives with her boyfriend   Social Drivers of Health   Financial Resource Strain: High Risk (04/23/2023)   Overall Financial Resource Strain (CARDIA)    Difficulty of Paying Living Expenses: Very hard  Food Insecurity: No Food Insecurity (10/08/2022)   Hunger Vital Sign    Worried About Running Out of Food in the Last Year: Never true    Ran Out of Food in the Last Year: Never true  Transportation Needs: No Transportation Needs (10/08/2022)   PRAPARE - Administrator, Civil Service (Medical): No    Lack of Transportation (Non-Medical): No  Physical Activity: Insufficiently Active (10/08/2022)   Exercise Vital Sign    Days of Exercise per Week: 2 days    Minutes of Exercise per Session: 40 min  Stress: No Stress Concern Present (04/23/2023)   [employer] of Occupational Health - Occupational Stress Questionnaire    Feeling of Stress : Not at all  Social Connections: Socially Isolated (04/23/2023)   Social Connection and Isolation Panel    Frequency of Communication with Friends and Family: More than three times a week    Frequency of Social Gatherings with Friends and Family: Once a week    Attends Religious Services: Never    Database administrator or Organizations: No    Attends Banker Meetings: Never    Marital Status: Never married  Intimate Partner Violence: Not At Risk (04/23/2023)   Humiliation, Afraid, Rape, and Kick questionnaire    Fear of Current or Ex-Partner: No    Emotionally Abused: No    Physically Abused: No    Sexually Abused: No    Physical Exam: Today's Vitals   12/14/23 1046  BP: (!) 171/97  Pulse: (!) 101  Temp: 98.8 F (37.1 C)  SpO2: 97%  Weight: 229 lb (103.9 kg)  Height: 5' 1 (1.549 m)   Body mass  index is 43.27 kg/m. GEN: NAD EYE: Sclerae anicteric ENT: MMM CV: Non-tachycardic GI: Soft, NT/ND NEURO:  Alert & Oriented x 3  Lab Results: No results for input(s): WBC, HGB, HCT, PLT in the last 72 hours. BMET No results for input(s): NA, K, CL, CO2, GLUCOSE, BUN, CREATININE, CALCIUM  in the last 72 hours. LFT No results for input(s): PROT, ALBUMIN, AST, ALT, ALKPHOS, BILITOT, BILIDIR, IBILI in the last 72 hours. PT/INR No results for input(s): LABPROT, INR in the last 72 hours.   Impression / Plan: This is a 48 y.o.female who presents for Colonoscopy for screening.  The risks and benefits of endoscopic evaluation/treatment were discussed with the patient and/or family; these include but are not limited to the risk of perforation, infection, bleeding, missed lesions, lack of diagnosis, severe illness requiring hospitalization, as well as anesthesia and sedation related illnesses.  The patient's history has been reviewed, patient examined, no change in status, and deemed stable for procedure.  The patient and/or family is agreeable to proceed.    Aloha Finner, MD [facility] Office # [phone]

## 2023-12-14 NOTE — Op Note (Signed)
 [facility] Patient Name: Tamara Osborne Procedure Date: 12/14/2023 11:33 AM MRN: [id number] Endoscopist: Aloha Finner , MD, [id number] Age: 48 Referring MD:  Date of Birth: 08-Feb-1976 Gender: Female Account #: [id number] Procedure:                Colonoscopy Indications:              Screening for colorectal malignant neoplasm Medicines:                Monitored Anesthesia Care Procedure:                Pre-Anesthesia Assessment:                           - Prior to the procedure, a History and Physical                            was performed, and patient medications and                            allergies were reviewed. The patient's tolerance of                            previous anesthesia was also reviewed. The risks                            and benefits of the procedure and the sedation                            options and risks were discussed with the patient.                            All questions were answered, and informed consent                            was obtained. Prior Anticoagulants: The patient has                            taken no anticoagulant or antiplatelet agents. ASA                            Grade Assessment: III - A patient with severe                            systemic disease. After reviewing the risks and                            benefits, the patient was deemed in satisfactory                            condition to undergo the procedure.                           After obtaining informed consent, the colonoscope  was passed under direct vision. Throughout the                            procedure, the patient's blood pressure, pulse, and                            oxygen saturations were monitored continuously. The                            Olympus Scope SN: [id number] was introduced through                            the anus and advanced to the 3 cm into the ileum.                            The  colonoscopy was performed without difficulty.                            The patient tolerated the procedure. The quality of                            the bowel preparation was adequate. The terminal                            ileum, ileocecal valve, appendiceal orifice, and                            rectum were photographed. Scope In: 11:44:24 AM Scope Out: 11:59:08 AM Scope Withdrawal Time: 0 hours 12 minutes 32 seconds  Total Procedure Duration: 0 hours 14 minutes 44 seconds  Findings:                 The digital rectal exam findings include                            hemorrhoids. Pertinent negatives include no                            palpable rectal lesions.                           A moderate amount of semi-liquid stool was found in                            the entire colon, interfering with visualization.                           The terminal ileum and ileocecal valve appeared                            normal.                           Five sessile polyps were found in the rectum (1),  sigmoid colon (2), splenic flexure (1) and                            ascending colon (1). The polyps were 2 to 10 mm in                            size. These polyps were removed with a cold snare.                            Resection and retrieval were complete.                           Multiple small-mouthed diverticula were found in                            the recto-sigmoid colon and sigmoid colon.                           Normal mucosa was found in the entire colon                            otherwise.                           Non-bleeding non-thrombosed internal hemorrhoids                            were found during retroflexion, during perianal                            exam and during digital exam. The hemorrhoids were                            Grade II (internal hemorrhoids that prolapse but                            reduce  spontaneously). Complications:            No immediate complications. Estimated Blood Loss:     Estimated blood loss was minimal. Impression:               - Hemorrhoids found on digital rectal exam.                           - Stool in the entire examined colon. Lavaged with                            adequate visualization.                           - The examined portion of the ileum was normal.                           - Five 2 to 10 mm polyps in the rectum, in the  sigmoid colon, at the splenic flexure and in the                            ascending colon, removed with a cold snare.                            Resected and retrieved.                           - Diverticulosis in the recto-sigmoid colon and in                            the sigmoid colon.                           - Normal mucosa in the entire examined colon                            otherwise.                           - Non-bleeding non-thrombosed internal hemorrhoids. Recommendation:           - The patient will be observed post-procedure,                            until all discharge criteria are met.                           - Discharge patient to home.                           - Patient has a contact number available for                            emergencies. The signs and symptoms of potential                            delayed complications were discussed with the                            patient. Return to normal activities tomorrow.                            Written discharge instructions were provided to the                            patient.                           - High fiber diet.                           - Use FiberCon 1-2 tablets PO daily.                           - Continue present  medications.                           - Await pathology results.                           - Repeat colonoscopy in likely 3-5 years for                            surveillance  based on pathology results.                           - The findings and recommendations were discussed                            with the patient.                           - The findings and recommendations were discussed                            with the designated responsible adult. Aloha Finner, MD 12/14/2023 12:04:08 PM

## 2023-12-15 ENCOUNTER — Telehealth: Payer: Self-pay

## 2023-12-15 NOTE — Telephone Encounter (Signed)
 No answer on follow up call - voice mail message left

## 2023-12-16 ENCOUNTER — Ambulatory Visit: Payer: Self-pay | Admitting: Gastroenterology

## 2023-12-16 LAB — SURGICAL PATHOLOGY

## 2023-12-20 ENCOUNTER — Ambulatory Visit (HOSPITAL_BASED_OUTPATIENT_CLINIC_OR_DEPARTMENT_OTHER)
Admission: RE | Admit: 2023-12-20 | Discharge: 2023-12-20 | Disposition: A | Discharge status: Home / Self Care | Payer: Self-pay | Source: Ambulatory Visit | Attending: Cardiovascular Disease | Admitting: Cardiovascular Disease

## 2023-12-20 DIAGNOSIS — E785 Hyperlipidemia, unspecified: Secondary | ICD-10-CM | POA: Insufficient documentation

## 2023-12-20 DIAGNOSIS — E1159 Type 2 diabetes mellitus with other circulatory complications: Secondary | ICD-10-CM | POA: Insufficient documentation

## 2023-12-20 DIAGNOSIS — I152 Hypertension secondary to endocrine disorders: Secondary | ICD-10-CM | POA: Insufficient documentation

## 2023-12-20 DIAGNOSIS — E1169 Type 2 diabetes mellitus with other specified complication: Secondary | ICD-10-CM | POA: Insufficient documentation

## 2023-12-22 ENCOUNTER — Ambulatory Visit: Payer: Self-pay | Admitting: Cardiovascular Disease

## 2023-12-22 DIAGNOSIS — E785 Hyperlipidemia, unspecified: Secondary | ICD-10-CM

## 2023-12-22 DIAGNOSIS — Z5181 Encounter for therapeutic drug level monitoring: Secondary | ICD-10-CM

## 2023-12-23 ENCOUNTER — Ambulatory Visit: Attending: Rheumatology | Admitting: Rheumatology

## 2023-12-23 ENCOUNTER — Encounter: Payer: Self-pay | Admitting: Rheumatology

## 2023-12-23 ENCOUNTER — Ambulatory Visit

## 2023-12-23 VITALS — BP 133/82 | HR 79 | Temp 98.0°F | Resp 17 | Ht 61.0 in | Wt 231.6 lb

## 2023-12-23 DIAGNOSIS — E66812 Obesity, class 2: Secondary | ICD-10-CM | POA: Diagnosis present

## 2023-12-23 DIAGNOSIS — C539 Malignant neoplasm of cervix uteri, unspecified: Secondary | ICD-10-CM | POA: Insufficient documentation

## 2023-12-23 DIAGNOSIS — F411 Generalized anxiety disorder: Secondary | ICD-10-CM | POA: Insufficient documentation

## 2023-12-23 DIAGNOSIS — E1122 Type 2 diabetes mellitus with diabetic chronic kidney disease: Secondary | ICD-10-CM | POA: Insufficient documentation

## 2023-12-23 DIAGNOSIS — N1832 Chronic kidney disease, stage 3b: Secondary | ICD-10-CM | POA: Diagnosis present

## 2023-12-23 DIAGNOSIS — M2550 Pain in unspecified joint: Secondary | ICD-10-CM | POA: Insufficient documentation

## 2023-12-23 DIAGNOSIS — E1159 Type 2 diabetes mellitus with other circulatory complications: Secondary | ICD-10-CM | POA: Diagnosis present

## 2023-12-23 DIAGNOSIS — Z6839 Body mass index (BMI) 39.0-39.9, adult: Secondary | ICD-10-CM | POA: Insufficient documentation

## 2023-12-23 DIAGNOSIS — M25551 Pain in right hip: Secondary | ICD-10-CM | POA: Insufficient documentation

## 2023-12-23 DIAGNOSIS — M7061 Trochanteric bursitis, right hip: Secondary | ICD-10-CM | POA: Insufficient documentation

## 2023-12-23 DIAGNOSIS — E785 Hyperlipidemia, unspecified: Secondary | ICD-10-CM | POA: Diagnosis present

## 2023-12-23 DIAGNOSIS — B009 Herpesviral infection, unspecified: Secondary | ICD-10-CM | POA: Diagnosis present

## 2023-12-23 DIAGNOSIS — R808 Other proteinuria: Secondary | ICD-10-CM | POA: Diagnosis present

## 2023-12-23 DIAGNOSIS — K760 Fatty (change of) liver, not elsewhere classified: Secondary | ICD-10-CM | POA: Diagnosis present

## 2023-12-23 DIAGNOSIS — K5790 Diverticulosis of intestine, part unspecified, without perforation or abscess without bleeding: Secondary | ICD-10-CM | POA: Insufficient documentation

## 2023-12-23 DIAGNOSIS — T451X5A Adverse effect of antineoplastic and immunosuppressive drugs, initial encounter: Secondary | ICD-10-CM | POA: Insufficient documentation

## 2023-12-23 DIAGNOSIS — G620 Drug-induced polyneuropathy: Secondary | ICD-10-CM | POA: Insufficient documentation

## 2023-12-23 DIAGNOSIS — N183 Chronic kidney disease, stage 3 unspecified: Secondary | ICD-10-CM | POA: Diagnosis present

## 2023-12-23 DIAGNOSIS — G4733 Obstructive sleep apnea (adult) (pediatric): Secondary | ICD-10-CM | POA: Insufficient documentation

## 2023-12-23 DIAGNOSIS — I152 Hypertension secondary to endocrine disorders: Secondary | ICD-10-CM | POA: Insufficient documentation

## 2023-12-23 DIAGNOSIS — M791 Myalgia, unspecified site: Secondary | ICD-10-CM | POA: Diagnosis present

## 2023-12-23 DIAGNOSIS — Z923 Personal history of irradiation: Secondary | ICD-10-CM | POA: Diagnosis present

## 2023-12-23 DIAGNOSIS — E1169 Type 2 diabetes mellitus with other specified complication: Secondary | ICD-10-CM | POA: Insufficient documentation

## 2023-12-23 DIAGNOSIS — R7689 Other specified abnormal immunological findings in serum: Secondary | ICD-10-CM | POA: Diagnosis present

## 2023-12-23 NOTE — Patient Instructions (Signed)
 Iliotibial Band Syndrome Rehab Ask your health care provider which exercises are safe for you. Do exercises exactly as told by your provider and adjust them as told. It's normal to feel mild stretching, pulling, tightness, or discomfort as you do these exercises. Stop right away if you feel sudden pain or your pain gets a lot worse. Do not begin these exercises until told by your provider. Stretching and range-of-motion exercises These exercises warm up your muscles and joints. They also improve the movement and flexibility of your hip and pelvis. Quadriceps stretch, prone  Lie face down (prone) on a firm surface like a bed or padded floor. Bend your left / right knee. Reach back to hold your ankle or pant leg. If you can't reach your ankle or pant leg, use a belt looped around your foot and grab the belt instead. Gently pull your heel toward your butt. Your knee should not slide out to the side. You should feel a stretch in the front of your thigh and knee, also called the quadriceps. Hold this position for __________ seconds. Repeat __________ times. Complete this exercise __________ times a day. Iliotibial band stretch The iliotibial band is a strip of tissue that runs along the outside of your hip down to your knee. Lie on your side with your left / right leg on top. Bend both knees and grab your left / right ankle. Stretch out your bottom arm to help you balance. Slowly bring your top knee back so your thigh goes behind your back. Slowly lower your top leg toward the floor until you feel a gentle stretch on the outside of your left / right hip and thigh. If you don't feel a stretch and your knee won't go farther, place the heel of your other foot on top of your knee and pull your knee down toward the floor with your foot. Hold this position for __________ seconds. Repeat __________ times. Complete this exercise __________ times a day. Strengthening exercises These exercises build strength  and endurance in your hip and pelvis. Endurance means your muscles can keep working even when they're tired. Straight leg raises, side-lying This exercise strengthens the muscles that rotate the leg at the hip and move it away from your body. These muscles are called hip abductors. Lie on your side with your left / right leg on top. Lie so your head, shoulder, hip, and knee line up. You can bend your bottom knee to help you balance. Roll your hips slightly forward so they're stacked directly over each other. Your left / right knee should face forward. Tense the muscles in your outer thigh and hip. Lift your top leg 4-6 inches (10-15 cm) off the ground. Hold this position for __________ seconds. Slowly lower your leg back down to the starting position. Let your muscles fully relax before doing this exercise again. Repeat __________ times. Complete this exercise __________ times a day. Leg raises, prone This exercise strengthens the muscles that move the hips backward. These muscles are called hip extensors. Lie face down (prone) on your bed or a firm surface. You can put a pillow under your hips for comfort and to support your lower back. Bend your left / right knee so your foot points straight up toward the ceiling. Keep the other leg straight and behind you. Squeeze your butt muscles. Lift your left / right thigh off the firm surface. Do not let your back arch. Tense your thigh muscle as hard as you can without having  more knee pain. Hold this position for __________ seconds. Slowly lower your leg to the starting position. Allow your leg to relax all the way. Repeat __________ times. Complete this exercise __________ times a day. Hip hike  Stand sideways on a bottom step. Place your feet so that your left / right leg is on the step, and the other foot is hanging off the side. If you need support for balance, hold onto a railing or wall. Keep your knees straight and your abdomen square,  meaning your hips are level. Then, lift your left / right hip up toward the ceiling. Slowly let your leg that's hanging off the step lower towards the floor. Your foot should get closer to the ground. Do not lean or bend your knees during this movement. Repeat __________ times. Complete this exercise __________ times a day. This information is not intended to replace advice given to you by your health care provider. Make sure you discuss any questions you have with your health care provider. Document Revised: 05/01/2022 Document Reviewed: 05/01/2022 Elsevier Patient Education  2024 [employer].

## 2023-12-25 LAB — SJOGRENS SYNDROME-B EXTRACTABLE NUCLEAR ANTIBODY: SSB (La) (ENA) Antibody, IgG: <1 AI

## 2023-12-25 LAB — ANTI-SMITH ANTIBODY: ENA SM Ab Ser-aCnc: <1 AI

## 2023-12-25 LAB — CK: Total CK: 71 U/L (ref 20–239)

## 2023-12-25 LAB — C3 AND C4
C3 Complement: 183 mg/dL (ref 83–193)
C4 Complement: 26 mg/dL (ref 15–57)

## 2023-12-25 LAB — ANTI-DNA ANTIBODY, DOUBLE-STRANDED: ds DNA Ab: <1 [IU]/mL

## 2023-12-25 LAB — CARDIOLIPIN ANTIBODIES, IGG, IGM, IGA
Anticardiolipin IgA: 5.6 [APL'U]/mL (ref <20.0)
Anticardiolipin IgG: <2 [GPL'U]/mL (ref <20.0)
Anticardiolipin IgM: 19.7 [MPL'U]/mL (ref <20.0)

## 2023-12-25 LAB — BETA-2 GLYCOPROTEIN ANTIBODIES
Beta-2 Glyco 1 IgA: 4.2 U/mL (ref <20.0)
Beta-2 Glyco 1 IgM: 14.4 U/mL (ref <20.0)
Beta-2 Glyco I IgG: <2 U/mL (ref <20.0)

## 2023-12-25 LAB — SJOGRENS SYNDROME-A EXTRACTABLE NUCLEAR ANTIBODY: SSA (Ro) (ENA) Antibody, IgG: <1 AI

## 2023-12-25 LAB — RNP ANTIBODY: Ribonucleic Protein(ENA) Antibody, IgG: <1 AI

## 2023-12-25 LAB — ANTI-SCLERODERMA ANTIBODY: Scleroderma (Scl-70) (ENA) Antibody, IgG: <1 AI

## 2023-12-25 NOTE — Progress Notes (Signed)
 Radiation Oncology         [phone] ________________________________  Name: Tamara Osborne MRN: [id number]  Date: 12/27/2023  DOB: September 04, 1975  Follow-Up Visit Note  CC: Knute Thersia Bitters, FNP  Pllc, [facility]  No diagnosis found.  Diagnosis: Stage II-B squamous cell carcinoma of the cervix   Interval Since Last Radiation: 4 years, 6 months, and 22 days   External beam therapy was from 03/23/2019 - 05/04/2019. HDR vaginal brachytherapy was from 05/19/2019 - 06/05/2019.   Site Technique Total Dose (Gy) Dose per Fx (Gy) Completed Fx Beam Energies  Cervix: Cervix HDR-brachy 5.5/5.5 5.5 1/1 Ir-192  Cervix: Cervix HDR-brachy 5.5/5.5 5.5 1/1 Ir-192  Cervix: Cervix_Bst HDR-brachy 5.5/5.5 5.5 1/1 Ir-192  Cervix: Cervix HDR-brachy 5.5/5.5 5.5 1/1 Ir-192  Cervix: Cervix 3D 45/45 1.8 25/25 10X, 15X  Cervix: Cervix HDR-brachy 5.5/5.5 5.5 1/1 Ir-192  Cervix: Cervix_Bst 3D 9/9 1.8 5/5 15X    Narrative:  The patient returns today for a routine annual follow-up visit (she was last seen here for follow-up on 12/21/22).                  In the interval since her last visit, she was seen in the ED on 02/16/23 for hematuria and was diagnosed with a UTI / hemorrhagic cystitis. She was given abx but continued to have some right flank pain after being discharged and developed recurrent hematuria which prompted a referral to urology on 05/21/23. A urine culture was obtained at that time which came back negative for UTI. A CT AP with and without contrast was also ordered in this setting on 07/16/23 and showed no acute GU findings to explain her hematuria. Other findings of potential clinical significance included: a pancreatic cyst, a small hiatal hernia, diverticulosis, and a fatty liver.   She was also seen by Dr. Viktoria on 06/10/23. She was noted to be doing relatively well and NED on examination at that time. Pap and HPV testing were collected at that time which both came back  negative. She has continued to follow with urology for management of recurrent UTI's.    Of note: In the setting of her family history of cancer, she was referred to genetics and underwent testing on 10/01/23. Results showed no clinically significant variants detected by +RNAinsight analyses.  She also recently underwent a colonoscopy on 12/14/23. Multiple polyps were found at that time; all biopsies obtained were negative for dysplasia or malignancy.   Other imaging performed in the interval since her last visit includes a cardiac scoring CT which showed greater than expected calcifications for her age range, and evidence of fatty liver disease. She is being followed by cardiology who are monitoring these findings closely.   ***   Allergies:  is allergic to shellfish allergy , bee venom, penicillins, and sulfa antibiotics.  Meds: Current Outpatient Medications  Medication Sig Dispense Refill   Accu-Chek Softclix Lancets lancets Use to check blood sugar TID. 100 each 2   amLODipine -valsartan  (EXFORGE ) 10-320 MG tablet Take 1 tablet by mouth daily. 90 tablet 3   azelastine  (ASTELIN ) 0.1 % nasal spray Place 1 spray into both nostrils 2 (two) times daily as needed. Use in each nostril as directed 30 mL 5   Blood Glucose Monitoring Suppl (ACCU-CHEK GUIDE) w/Device KIT Use to check blood sugar TID. 1 kit 0   buPROPion (WELLBUTRIN XL) 150 MG 24 hr tablet Take 150 mg by mouth daily. (Patient not taking: Reported on 12/23/2023)  Cholecalciferol 50 MCG (2000 UT) TABS Take by mouth.     conjugated estrogens  (PREMARIN ) vaginal cream Place 1 Applicatorful vaginally every other day. (Patient taking differently: Place 1 applicator vaginally every 3 (three) days.) 42.5 g 12   EPINEPHrine  (EPIPEN  2-PAK) 0.3 mg/0.3 mL IJ SOAJ injection Inject 0.3 mg into the muscle as needed for anaphylaxis. 2 each 1   fluticasone  (FLONASE ) 50 MCG/ACT nasal spray Place 2 sprays into both nostrils daily. 16 g 5   glucose  blood (ACCU-CHEK GUIDE) test strip Use to check blood sugar TID. 100 each 2   ipratropium (ATROVENT ) 0.06 % nasal spray Place 2 sprays into both nostrils 3 (three) times daily as needed for rhinitis (drainage). 15 mL 5   levocetirizine (XYZAL ) 5 MG tablet Take 1 tablet (5 mg total) by mouth every evening. 90 tablet 3   mupirocin  ointment (BACTROBAN ) 2 % Apply 1 Application topically 2 (two) times daily. (Patient not taking: Reported on 12/23/2023) 60 g 0   Na Sulfate-K Sulfate-Mg Sulfate concentrate (SUPREP BOWEL PREP KIT) 17.5-3.13-1.6 GM/177ML SOLN Take 1 kit (354 mLs total) by mouth as directed. For colonoscopy prep (Patient not taking: Reported on 12/23/2023) 354 mL 0   pregabalin  (LYRICA ) 25 MG capsule Take 1 capsule (25 mg total) by mouth 2 (two) times daily as needed (neuropathic pain). (Patient not taking: Reported on 12/23/2023) 30 capsule 3   RESTASIS 0.05 % ophthalmic emulsion 1 drop 2 (two) times daily.     Semaglutide , 2 MG/DOSE, 8 MG/3ML SOPN Inject 2 mg as directed once a week. 3 mL 0   Current Facility-Administered Medications  Medication Dose Route Frequency Provider Last Rate Last Admin   0.9 %  sodium chloride  infusion  500 mL Intravenous Once Mansouraty, Aloha Raddle., MD        Physical Findings: The patient is in no acute distress. Patient is alert and oriented.  vitals were not taken for this visit. .  No significant changes. Lungs are clear to auscultation bilaterally. Heart has regular rate and rhythm. No palpable cervical, supraclavicular, or axillary adenopathy. Abdomen soft, non-tender, normal bowel sounds.  On pelvic examination the external genitalia were unremarkable. A speculum exam was performed. There are no mucosal lesions noted in the vaginal vault. A Pap smear was obtained of the proximal vagina. On bimanual and rectovaginal examination there were no pelvic masses appreciated. ***   Lab Findings: Lab Results  Component Value Date   WBC 9.3 08/06/2022   HGB  15.3 08/06/2022   HCT 45.3 08/06/2022   MCV 83 08/06/2022   PLT 326 08/06/2022    Radiographic Findings: XR HIP UNILAT W OR W/O PELVIS 2-3 VIEWS RIGHT Result Date: 12/23/2023 No hip joint narrowing was noted.  No chondrocalcinosis was noted.  No SI joint narrowing or sclerosis was noted. Impression: Unremarkable x-rays of the hip.  CT CARDIAC SCORING (SELF PAY ONLY) Addendum Date: 12/21/2023 ADDENDUM REPORT: 12/21/2023 15:14 EXAM: OVER-READ INTERPRETATION  CT CHEST The following report is an over-read performed by radiologist Dr. Andrea Gasman of [facility] on 12/21/2023. This over-read does not include interpretation of cardiac or coronary anatomy or pathology. The coronary calcium  score interpretation by the cardiologist is attached. COMPARISON:  None. FINDINGS: Vascular: Aortic atherosclerosis. The included aorta is normal in caliber. Mediastinum/nodes: No adenopathy or mass.  Decompressed esophagus. Lungs: No focal airspace disease. No suspicious pulmonary nodule. No pleural fluid. The included airways are patent. Upper abdomen: No acute findings.  Diffuse hepatic steatosis. Musculoskeletal: There are no  acute or suspicious osseous abnormalities. IMPRESSION: 1.  Aortic Atherosclerosis (ICD10-I70.0). 2. Hepatic steatosis. Electronically Signed   By: Andrea Gasman M.D.   On: 12/21/2023 15:14   Result Date: 12/21/2023 CLINICAL DATA:  Risk stratification: 48 Year-old Female EXAM: Coronary Calcium  Score TECHNIQUE: A gated, non-contrast computed tomography scan of the heart was performed using 2.5 mm slice thickness. Axial images were analyzed on a dedicated workstation. Calcium  scoring of the coronary arteries was performed using the Agatston method. FINDINGS: Coronary Calcium  Score: Left main: 0 Left anterior descending artery: 145 Left circumflex artery: 222 Right coronary artery: 20 Total: 387 Percentile: 99th Ascending Aorta: Normal caliber. Aortic atherosclerosis. Aortic Valve  Calcium  score: 26 Mitral annular calcification: Minimal Pericardium: Normal. Non-cardiac: See separate report from [facility]. IMPRESSION: 1. Coronary calcium  score of 387. This was 99th percentile for age-, race-, and sex-matched controls. RECOMMENDATIONS: Coronary artery calcium  (CAC) score is a strong predictor of incident coronary heart disease (CHD) and provides predictive information beyond traditional risk factors. CAC scoring is reasonable to use in the decision to withhold, postpone, or initiate statin therapy in intermediate-risk or selected borderline-risk asymptomatic adults (age 80-75 years and LDL-C >=70 to <190 mg/dL) who do not have diabetes or established atherosclerotic cardiovascular disease (ASCVD).* In intermediate-risk (10-year ASCVD risk >=7.5% to <20%) adults or selected borderline-risk (10-year ASCVD risk >=5% to <7.5%) adults in whom a CAC score is measured for the purpose of making a treatment decision the following recommendations have been made: If CAC=0, it is reasonable to withhold statin therapy and reassess in 5 to 10 years, as long as higher risk conditions are absent (diabetes mellitus, family history of premature CHD in first degree relatives (males <55 years; females <65 years), cigarette smoking, or LDL >=190 mg/dL). If CAC is 1 to 99, it is reasonable to initiate statin therapy for patients >=51 years of age. If CAC is >=100 or >=75th percentile, it is reasonable to initiate statin therapy at any age. Cardiology referral should be considered for patients with CAC scores >=400 or >=75th percentile. *2018 AHA/ACC/AACVPR/AAPA/ABC/ACPM/ADA/AGS/APhA/ASPC/NLA/PCNA Guideline on the Management of Blood Cholesterol: A Report of the American College of Cardiology/American Heart Association Task Force on Clinical Practice Guidelines. J Am Coll Cardiol. 2019;73(24):3168-3209. Stanly Leavens, MD Electronically Signed: By: Stanly Leavens M.D. On: 12/20/2023 16:02     Impression: Stage II-B squamous cell carcinoma of the cervix   The patient is recovering from the effects of radiation.  ***  Plan:  ***   *** minutes of total time was spent for this patient encounter, including preparation, face-to-face counseling with the patient and coordination of care, physical exam, and documentation of the encounter. ____________________________________  Lynwood CHARM Nasuti, PhD, MD  This document serves as a record of services personally performed by Lynwood Nasuti, MD. It was created on his behalf by Dorthy Fuse, a trained medical scribe. The creation of this record is based on the scribe's personal observations and the provider's statements to them. This document has been checked and approved by the attending provider.

## 2023-12-27 ENCOUNTER — Ambulatory Visit
Admission: RE | Admit: 2023-12-27 | Discharge: 2023-12-27 | Disposition: A | Discharge status: Home / Self Care | Payer: Self-pay | Source: Ambulatory Visit | Attending: Radiation Oncology | Admitting: Radiation Oncology

## 2023-12-27 ENCOUNTER — Ambulatory Visit: Payer: Self-pay | Admitting: Rheumatology

## 2023-12-27 ENCOUNTER — Encounter: Payer: Self-pay | Admitting: Radiation Oncology

## 2023-12-27 VITALS — BP 158/96 | HR 91 | Temp 97.8°F | Resp 20 | Ht 61.0 in | Wt 231.6 lb

## 2023-12-27 DIAGNOSIS — Z923 Personal history of irradiation: Secondary | ICD-10-CM | POA: Diagnosis not present

## 2023-12-27 DIAGNOSIS — K760 Fatty (change of) liver, not elsewhere classified: Secondary | ICD-10-CM | POA: Insufficient documentation

## 2023-12-27 DIAGNOSIS — I700 Atherosclerosis of aorta: Secondary | ICD-10-CM | POA: Diagnosis not present

## 2023-12-27 DIAGNOSIS — C539 Malignant neoplasm of cervix uteri, unspecified: Secondary | ICD-10-CM

## 2023-12-27 DIAGNOSIS — Z7985 Long-term (current) use of injectable non-insulin antidiabetic drugs: Secondary | ICD-10-CM | POA: Insufficient documentation

## 2023-12-27 DIAGNOSIS — I3481 Nonrheumatic mitral (valve) annulus calcification: Secondary | ICD-10-CM | POA: Insufficient documentation

## 2023-12-27 DIAGNOSIS — Z9221 Personal history of antineoplastic chemotherapy: Secondary | ICD-10-CM | POA: Insufficient documentation

## 2023-12-27 DIAGNOSIS — Z8541 Personal history of malignant neoplasm of cervix uteri: Secondary | ICD-10-CM | POA: Diagnosis present

## 2023-12-27 DIAGNOSIS — Z79899 Other long term (current) drug therapy: Secondary | ICD-10-CM | POA: Insufficient documentation

## 2023-12-27 LAB — BASIC METABOLIC PANEL WITH GFR
BUN: 22 — AB (ref 4–21)
CO2: 24 — AB (ref 13–22)
Chloride: 102 (ref 99–108)
Creatinine: 1.6 — AB (ref 0.5–1.1)
Glucose: 96
Potassium: 4.4 meq/L (ref 3.5–5.1)
Sodium: 140 (ref 137–147)

## 2023-12-27 LAB — COMPREHENSIVE METABOLIC PANEL WITH GFR
Calcium: 10.1 (ref 8.7–10.7)
eGFR: 41

## 2023-12-27 LAB — PROTEIN / CREATININE RATIO, URINE
Albumin, U: 733.4
Creatinine, Urine: 120.5
Microalb Creat Ratio: 609

## 2023-12-27 LAB — MICROALBUMIN / CREATININE URINE RATIO: Microalb Creat Ratio: 609

## 2023-12-27 NOTE — Progress Notes (Signed)
 Tamara Osborne is here today for follow up post radiation to the pelvic.  They completed their radiation on: 06/05/19  Does the patient complain of any of the following:  Pain: Denies pain in pelvic area but complains of pain in foot 3/10. Abdominal bloating: Yes, occasionally. Diarrhea/Constipation: Denies Nausea/Vomiting: Denies Vaginal Discharge: Denies Blood in Urine or Stool: + blood in urine x 2 months.  Urinary Issues (dysuria/incomplete emptying/ incontinence/ increased frequency/urgency): Denies Does patient report using vaginal dilator 2-3 times a week and/or sexually active 2-3 weeks: Yes Post radiation skin changes: Denies   Additional comments if applicable: None at this time.  BP (!) 158/96 (BP Location: Right Arm, Patient Position: Sitting, Cuff Size: Large)   Pulse 91   Temp 97.8 F (36.6 C)   Resp 20   Ht 5' 1 (1.549 m)   Wt 231 lb 9.6 oz (105.1 kg)   LMP  (LMP Unknown)   SpO2 98%   BMI 43.76 kg/m

## 2023-12-27 NOTE — Progress Notes (Signed)
 CK normal, ENA panel negative, complements normal, beta-2 GP 1 negative, anticardiolipin negative.  I will discuss results at the follow-up visit

## 2023-12-28 ENCOUNTER — Telehealth: Payer: Self-pay | Admitting: Cardiovascular Disease

## 2023-12-28 ENCOUNTER — Encounter (HOSPITAL_BASED_OUTPATIENT_CLINIC_OR_DEPARTMENT_OTHER): Payer: Self-pay | Admitting: *Deleted

## 2023-12-28 NOTE — Telephone Encounter (Signed)
 Pt is requesting a callback regarding her CT results. Please advise

## 2023-12-28 NOTE — Telephone Encounter (Signed)
 Spoke with patient who is concerned about taking Crestor Has taken Lipitor in the past and had horrible leg cramps to the point she could hardly walk  Nephrologist just put her on Kerendia 10 mg yesterday, advised ok to take together Recently saw Rheumatologist 12/23/2023 and wanted Dr Raford to be aware and make sure ok to take with the Crestor.  Has had positive ANA and concerned about the potential myalgias with statins She is willing to take if Dr Raford feels ok with her recent visit   Will forward to Dr Raford for review

## 2023-12-29 NOTE — Progress Notes (Signed)
 PATIENT: Tamara Osborne DOB: [birth year]  REASON FOR VISIT: follow up HISTORY FROM: patient  Chief Complaint  Patient presents with   RM2/CPAP    Pt is here Alone Pt states that things have been going good with her CPAP Machine.      HISTORY OF PRESENT ILLNESS:  01/03/24 ALL:  Tamara Osborne returns for follow up for OSA on CPAP. She continues to do well on therapy. She is using CPAP nightly for about 10 hours, on average. She is sleeping well. She does not sleep without therapy. She has noted some nights where she wakes and water chamber is dry. Otherwise, no concerns.     12/30/2022 ALL:  Tamara Osborne returns for follow up for OSA on CPAP. She continues to do well on therapy. She does not sleep without her machine. She uses therapy about 9 hours every night. She may take naps on occasion if really tired. She denies concerns with machine or supplies.      12/29/2021 ALL:  Tamara Osborne returns for follow up for OSA on CPAP. She was last seen 05/2021 and reported sleeping 12/13 hours daily. She was encouraged to continue close follow up with PCP, oncology and weight management. Since, she reports doing ok. She was doing really good with exercise and weight management until the end of August when they removed her port. She has not gotten back into the habit of exercising. She reports losing about 10-12lbs but has gained about 5lbs back. She is hoping to get back to the [facility] this week. She reports BP has been pretty good. She just took her amlodipine  about 30 minutes ago. Home readings are usually 120-130/80-90. She continues to sleep very well. She is sleeping about 13 hours a day. She usually sleeps about 10 hours at night but then will nap for 2-3 hours every day. She does continue to have a cough and congestion. Some wheezing. She reports having significant allergies requiring allergy  injections in past. She has not seen PCP for this.      06/25/2021 ALL:  Tamara Osborne is a 48 y.o.  female here today for follow up for OSA on CPAP.  She is doing well on therapy. She is using CPAP most every night for about 10 hours. She reports sleeping very well. She feels that she is sleeping so deeply that she is having urinary incontinence at night. She has a history of cervical cancer with radiation and chemo that has effected functioning of bladder. She reports sleeping up to 12-13 hours some nights. She reports more condensation in mask and feels that she may have more chest congestion on nights where she has slept longer. This occurs about 1-2 times a month. She usually goes to bed around 9pm. She likes to get up early in the morning but sometimes sleeps until 9:30. She has not been as active. She has gained 15 pounds. She does meet with her cancer support group regularly during the week. She likes to swim. She reports BP is usually well managed. She just took morning BP medication while waiting for out visit. She is no longer having to take clonidine .     HISTORY: (copied from Dr Obie previous note)  Tamara Osborne is a 48 year old right-handed woman with an underlying medical history of palpitations, hypertension, neuropathy, diabetes, history of cervical cancer, anxiety, anemia, and obesity, who presents for follow-up consultation of her obstructive sleep apnea after interim home sleep testing and starting AutoPap therapy.  The patient is unaccompanied  today.  I first met her at the request of Dr. Lockie on 11/21/2019, at which time she reported a prior diagnosis of sleep apnea but she has never been on CPAP therapy before as insurance did not cover her machine.  She had a home sleep test on 12/10/2020 which indicated severe sleep apnea with an AHI of 37.8/h, O2 nadir 86%.  She was advised to start AutoPap therapy, her set up date was 12/22/2020.   Today, 06/25/2020: I reviewed her AutoPap compliance data from 05/24/2020 through 06/23/2020, which is a total of 31 minutes, during which time she  used her machine every night with percent use days greater than 4 hours at 90.3%, indicating excellent compliance, average usage of 6 hours and 14 minutes, residual AHI at goal at 0.2/h, leak and no, 95th percentile of pressure at 8 cm.  She reports having adjusted well to treatment.  She uses a DreamWear fullface mask, size small.  She likes this mask.  She has benefited from treatment quite significantly.  She reports better quality sleep, more sleep consolidation, less daytime somnolence to the point where she does not need to take any naps.  Previously, she would take longer naps.  She also has noticed improvement in her nocturia.  She still has issues with urinary incontinence since her cervical cancer surgeries and she has benefited from physical therapy for this.  She does have to wear protection at night.  She has also noticed improvement in her blood pressure.  In fact, she has not needed to use her clonidine  since starting her AutoPap.  She is working on weight loss, she has been on Victoza .  She is highly motivated to continue with her AutoPap.   The patient's allergies, current medications, family history, past medical history, past social history, past surgical history and problem list were reviewed and updated as appropriate.    Previously:    11/21/19: (She) was diagnosed with obstructive sleep apnea several years ago.  She reports significant daytime somnolence.  Prior sleep study results are not available for my review today.  She reports that sleep study testing was done in [place]  about 7 or 8 years ago.  She qualified for CPAP therapy but her insurance would not cover it at the time.  She has had weight fluctuations.  She was diagnosed with cervical cancer earlier in the year and underwent surgery, radiation and chemotherapy.  She had lost weight, down to 180 pounds but gained a lot of weight back.  She is in the process of trying to lose weight.  She recently started Victoza . I reviewed  your office note from 10/19/2019.  Her Epworth sleepiness score is 14 out of 24 today, fatigue severity score is 32 out of 63.  His snoring is loud and disturbs her boyfriend.  She lives with her boyfriend, they have 1 dog in the household.  She has 3 grown children, ages 14, 74 and 48.  She is currently not working.  She is hoping to go back to work after October.  She has an appointment with her oncologist in early October.  Bedtime is around 9 or 9:30 PM and rise time generally around 8 or 9.  She does wake up around 5 or 5:30 AM to let the dog out but goes back to bed.  She has nocturia about once per average night, she denies recurrent morning headaches.  She has 1 niece with sleep apnea.  She is trying to reduce her caffeine intake.  She drinks approximately 1 serving per day currently.  She does not drink alcohol, she is a non-smoker.  She would be willing to get rechecked for sleep apnea and consider CPAP therapy.   REVIEW OF SYSTEMS: Out of a complete 14 system review of symptoms, the patient complains only of the following symptoms, urinary incontinence, weight gain and all other reviewed systems are negative.  ESS: 11/24, previously 18  ALLERGIES: Allergies  Allergen Reactions   Shellfish Allergy  Swelling    Seafood also any kind   Atorvastatin      Leg cramps     Bee Venom Other (See Comments)    Legs swollen   Penicillins Other (See Comments)    Not sure a child allergy    Sulfa Antibiotics Other (See Comments)    Not sure a child allergy     HOME MEDICATIONS: Outpatient Medications Prior to Visit  Medication Sig Dispense Refill   Accu-Chek Softclix Lancets lancets Use to check blood sugar TID. 100 each 2   amLODipine -valsartan  (EXFORGE ) 10-320 MG tablet Take 1 tablet by mouth daily. 90 tablet 3   azelastine  (ASTELIN ) 0.1 % nasal spray Place 1 spray into both nostrils 2 (two) times daily as needed. Use in each nostril as directed 30 mL 5   Blood Glucose Monitoring Suppl  (ACCU-CHEK GUIDE) w/Device KIT Use to check blood sugar TID. 1 kit 0   Cholecalciferol 50 MCG (2000 UT) TABS Take by mouth.     conjugated estrogens  (PREMARIN ) vaginal cream Place 1 Applicatorful vaginally every other day. (Patient taking differently: Place 1 applicator vaginally every 3 (three) days.) 42.5 g 12   EPINEPHrine  (EPIPEN  2-PAK) 0.3 mg/0.3 mL IJ SOAJ injection Inject 0.3 mg into the muscle as needed for anaphylaxis. 2 each 1   fluticasone  (FLONASE ) 50 MCG/ACT nasal spray Place 2 sprays into both nostrils daily. 16 g 5   glucose blood (ACCU-CHEK GUIDE) test strip Use to check blood sugar TID. 100 each 2   ipratropium (ATROVENT ) 0.06 % nasal spray Place 2 sprays into both nostrils 3 (three) times daily as needed for rhinitis (drainage). 15 mL 5   KERENDIA 10 MG TABS Take 1 tablet by mouth daily.     levocetirizine (XYZAL ) 5 MG tablet Take 1 tablet (5 mg total) by mouth every evening. 90 tablet 3   mupirocin  ointment (BACTROBAN ) 2 % Apply 1 Application topically 2 (two) times daily. 60 g 0   RESTASIS 0.05 % ophthalmic emulsion 1 drop 2 (two) times daily.     Semaglutide , 2 MG/DOSE, 8 MG/3ML SOPN Inject 2 mg as directed once a week. 3 mL 0   buPROPion (WELLBUTRIN XL) 150 MG 24 hr tablet Take 150 mg by mouth daily. (Patient not taking: Reported on 01/03/2024)     Na Sulfate-K Sulfate-Mg Sulfate concentrate (SUPREP BOWEL PREP KIT) 17.5-3.13-1.6 GM/177ML SOLN Take 1 kit (354 mLs total) by mouth as directed. For colonoscopy prep (Patient not taking: Reported on 01/03/2024) 354 mL 0   pregabalin  (LYRICA ) 25 MG capsule Take 1 capsule (25 mg total) by mouth 2 (two) times daily as needed (neuropathic pain). (Patient not taking: Reported on 01/03/2024) 30 capsule 3   Facility-Administered Medications Prior to Visit  Medication Dose Route Frequency Provider Last Rate Last Admin   0.9 %  sodium chloride  infusion  500 mL Intravenous Once Mansouraty, Aloha Raddle., MD        PAST MEDICAL HISTORY: Past  Medical History:  Diagnosis Date   Anemia    Angio-edema  Anxiety    Arthritis    Bartholin cyst    Cervical cancer (HCC)    Chronic kidney disease    Constipation    Depression    Deviated septum    Diabetes mellitus without complication (HCC)    Difficult intravenous access    used port for 05-09-2019 surgery   Fibroids    Genetic testing 10/11/2023   Negative CancerNext-Expanded +RNAinsight panel. The CancerNext-Expanded gene panel offered by [facility] and includes sequencing, rearrangement, and RNA analysis for the following 77 genes: AIP, ALK, APC, ATM, AXIN2, BAP1, BARD1, BMPR1A, BRCA1, BRCA2, BRIP1, CDC73, CDH1, CDK4, CDKN1B, CDKN2A, CEBPA, CHEK2, CTNNA1, DDX41, DICER1, EGFR, EPCAM, ETV6, FH, FLCN, GATA2, GREM1, HOXB13, KIT, LZTR1, MA   History of blood transfusion    2 units given 04-26-2019, has had total of 8 units since jan 2021   History of blood transfusion 1 unit given 05-30-19   iv fluids also given   History of cervical cancer 07/16/2022   History of kidney problems    History of radiation therapy 03/23/2019-05/04/2019   Cervical external beam   Dr Lynwood Nasuti   History of radiation therapy 05/09/2019-06/05/2019   vaginal brachytherapy   Dr Lynwood Nasuti   History of recent blood transfusion 05/16/2019   2 units given  per dr melrose at cancer center   Hyperlipidemia    Hypertension    Neuropathy    both thumbs   Obesity    Palpitations    PONV (postoperative nausea and vomiting)    Prediabetes    Preeclampsia 2006   Sleep apnea    no cpap used insurance would not cover, osa severe per pt   SOB (shortness of breath)    Squamous cell carcinoma of cervix (HCC) 03/23/2019   Swallowing difficulty    Symptomatic skin lesion 07/14/2022   Vitamin D  deficiency 01/10/2020    PAST SURGICAL HISTORY: Past Surgical History:  Procedure Laterality Date   CESAREAN SECTION WITH BILATERAL TUBAL LIGATION  11/10/2004       COLONOSCOPY  12/14/2023   DILATION AND CURETTAGE  OF UTERUS  1997   IR IMAGING GUIDED PORT INSERTION  04/04/2019   IR REMOVAL TUN ACCESS W/ PORT W/O FL MOD SED  10/27/2021   OPERATIVE ULTRASOUND N/A 05/09/2019   Procedure: OPERATIVE ULTRASOUND;  Surgeon: Nasuti Lynwood, MD;  Location: [facility];  Service: Urology;  Laterality: N/A;   OPERATIVE ULTRASOUND N/A 05/15/2019   Procedure: OPERATIVE ULTRASOUND;  Surgeon: Nasuti Lynwood, MD;  Location: [facility];  Service: Urology;  Laterality: N/A;   OPERATIVE ULTRASOUND N/A 05/22/2019   Procedure: OPERATIVE ULTRASOUND;  Surgeon: Nasuti Lynwood, MD;  Location: [facility];  Service: Urology;  Laterality: N/A;   OPERATIVE ULTRASOUND N/A 05/29/2019   Procedure: OPERATIVE ULTRASOUND;  Surgeon: Nasuti Lynwood, MD;  Location: [facility];  Service: Urology;  Laterality: N/A;   OPERATIVE ULTRASOUND N/A 06/05/2019   Procedure: OPERATIVE ULTRASOUND;  Surgeon: Nasuti Lynwood, MD;  Location: [facility];  Service: Urology;  Laterality: N/A;   TANDEM RING INSERTION N/A 05/09/2019   Procedure: TANDEM RING INSERTION;  Surgeon: Nasuti Lynwood, MD;  Location: [facility];  Service: Urology;  Laterality: N/A;   TANDEM RING INSERTION N/A 05/15/2019   Procedure: TANDEM RING INSERTION;  Surgeon: Nasuti Lynwood, MD;  Location: [facility];  Service: Urology;  Laterality: N/A;   TANDEM RING INSERTION N/A 05/22/2019   Procedure: TANDEM RING INSERTION;  Surgeon:  Shannon Agent, MD;  [facility];  Service: Urology;  Laterality: N/A;   TANDEM RING INSERTION N/A 05/29/2019   Procedure: TANDEM RING INSERTION;  Surgeon: Shannon Agent, MD;  Location: [facility];  Service: Urology;  Laterality: N/A;   TANDEM RING INSERTION N/A 06/05/2019   Procedure: TANDEM RING INSERTION;  Surgeon: Shannon Agent, MD;  Location: [facility];  Service: Urology;  Laterality: N/A;    FAMILY  HISTORY: Family History  Problem Relation Age of Onset   Brain cancer Mother 60   Hypertension Mother    Depression Mother    Anxiety disorder Mother    Lung cancer Mother 6   Diabetes Father    Kidney disease Father    Kidney cancer Father 37   Heart attack Sister    Cervical cancer Sister 10   Arthritis Sister    Heart failure Paternal Aunt    Heart attack Paternal Uncle    Cancer Maternal Grandmother        possibly lung cancer?   Kidney failure Maternal Grandfather    Leukemia Paternal Grandmother 50   Heart attack Paternal Grandfather    Brain cancer Paternal Cousin 9   Cervical cancer Maternal Cousin    Tourette syndrome Son    Healthy Son    Healthy Daughter    Healthy Daughter    Colon cancer Neg Hx    Esophageal cancer Neg Hx    Inflammatory bowel disease Neg Hx    Liver disease Neg Hx    Pancreatic cancer Neg Hx    Rectal cancer Neg Hx    Stomach cancer Neg Hx     SOCIAL HISTORY: Social History   Socioeconomic History   Marital status: Single    Spouse name: Not on file   Number of children: 4   Years of education: Not on file   Highest education level: Not on file  Occupational History   Not on file  Tobacco Use   Smoking status: Never    Passive exposure: Past   Smokeless tobacco: Never  Vaping Use   Vaping status: Never Used  Substance and Sexual Activity   Alcohol use: No   Drug use: No   Sexual activity: Not Currently    Birth control/protection: Surgical  Other Topics Concern   Not on file  Social History Narrative   Lives with her boyfriend   Social Drivers of Health   Financial Resource Strain: High Risk (04/23/2023)   Overall Financial Resource Strain (CARDIA)    Difficulty of Paying Living Expenses: Very hard  Food Insecurity: No Food Insecurity (10/08/2022)   Hunger Vital Sign    Worried About Running Out of Food in the Last Year: Never true    Ran Out of Food in the Last Year: Never true  Transportation Needs: No  Transportation Needs (10/08/2022)   PRAPARE - Administrator, Civil Service (Medical): No    Lack of Transportation (Non-Medical): No  Physical Activity: Insufficiently Active (10/08/2022)   Exercise Vital Sign    Days of Exercise per Week: 2 days    Minutes of Exercise per Session: 40 min  Stress: No Stress Concern Present (04/23/2023)   [employer] of Occupational Health - Occupational Stress Questionnaire    Feeling of Stress : Not at all  Social Connections: Socially Isolated (04/23/2023)   Social Connection and Isolation Panel    Frequency of Communication with Friends and Family: More than three times a  week    Frequency of Social Gatherings with Friends and Family: Once a week    Attends Religious Services: Never    Database Administrator or Organizations: No    Attends Banker Meetings: Never    Marital Status: Never married  Intimate Partner Violence: Not At Risk (04/23/2023)   Humiliation, Afraid, Rape, and Kick questionnaire    Fear of Current or Ex-Partner: No    Emotionally Abused: No    Physically Abused: No    Sexually Abused: No     PHYSICAL EXAM  Vitals:   01/03/24 1016  BP: 139/82  Pulse: 84  SpO2: 99%  Weight: 230 lb (104.3 kg)  Height: 5' 1 (1.549 m)      Body mass index is 43.46 kg/m.  Generalized: Well developed, in no acute distress  Cardiology: normal rate and rhythm, no murmur noted Respiratory: clear to auscultation bilaterally  Neurological examination  Mentation: Alert oriented to time, place, history taking. Follows all commands speech and language fluent Cranial nerve II-XII: Pupils were equal round reactive to light. Extraocular movements were full, visual field were full  Motor: The motor testing reveals 5 over 5 strength of all 4 extremities. Good symmetric motor tone is noted throughout.  Gait and station: Gait is normal.    DIAGNOSTIC DATA (LABS, IMAGING, TESTING) - I reviewed patient records, labs,  notes, testing and imaging myself where available.      No data to display           Lab Results  Component Value Date   WBC 9.3 08/06/2022   HGB 15.3 08/06/2022   HCT 45.3 08/06/2022   MCV 83 08/06/2022   PLT 326 08/06/2022      Component Value Date/Time   NA 140 06/09/2023 0000   K 4.8 06/09/2023 0000   CL 104 06/09/2023 0000   CO2 20 08/06/2022 1158   GLUCOSE 94 08/06/2022 1158   GLUCOSE 186 (H) 10/23/2021 1218   BUN 23 (A) 06/09/2023 0000   CREATININE 1.4 (A) 06/09/2023 0000   CREATININE 1.50 (H) 08/06/2022 1158   CREATININE 1.44 (H) 12/12/2020 1108   CALCIUM  9.4 06/09/2023 0000   PROT 7.8 08/06/2022 1158   ALBUMIN 4.4 12/09/2022 1203   ALBUMIN 4.5 08/06/2022 1158   AST 27 08/06/2022 1158   AST 49 (H) 12/12/2020 1108   ALT 48 (H) 08/06/2022 1158   ALT 70 (H) 12/12/2020 1108   ALKPHOS 97 08/06/2022 1158   BILITOT 0.3 08/06/2022 1158   BILITOT 1.0 12/12/2020 1108   GFRNONAA 56 (L) 10/23/2021 1218   GFRNONAA 46 (L) 12/12/2020 1108   GFRAA 55 (L) 09/20/2019 1106   Lab Results  Component Value Date   CHOL 213 (H) 08/04/2023   HDL 41 08/04/2023   LDLCALC 149 (H) 08/04/2023   TRIG 125 08/04/2023   CHOLHDL 5.2 (H) 08/04/2023   Lab Results  Component Value Date   HGBA1C 5.6 08/04/2023   No results found for: VITAMINB12 Lab Results  Component Value Date   TSH 1.850 08/06/2022     ASSESSMENT AND PLAN 48 y.o. year old female  has a past medical history of Anemia, Angio-edema, Anxiety, Arthritis, Bartholin cyst, Cervical cancer (HCC), Chronic kidney disease, Constipation, Depression, Deviated septum, Diabetes mellitus without complication (HCC), Difficult intravenous access, Fibroids, Genetic testing (10/11/2023), History of blood transfusion, History of blood transfusion (1 unit given 05-30-19), History of cervical cancer (07/16/2022), History of kidney problems, History of radiation therapy (03/23/2019-05/04/2019), History of radiation  therapy  (05/09/2019-06/05/2019), History of recent blood transfusion (05/16/2019), Hyperlipidemia, Hypertension, Neuropathy, Obesity, Palpitations, PONV (postoperative nausea and vomiting), Prediabetes, Preeclampsia (2006), Sleep apnea, SOB (shortness of breath), Squamous cell carcinoma of cervix (HCC) (03/23/2019), Swallowing difficulty, Symptomatic skin lesion (07/14/2022), and Vitamin D  deficiency (01/10/2020). here with     ICD-10-CM   1. OSA on CPAP  G47.33 For home use only DME continuous positive airway pressure (CPAP)      Tamara Osborne is doing well on CPAP therapy. Compliance report reveals excellent compliance. She was encouraged to continue using CPAP nightly and for greater than 4 hours each night. ESS remains elevated, however, she feels she is doing well. It has improved since last year. We could consider stimulant for EDS if needed. She will continue to increase physical activity. She is followed by weight management and will continue focusing on healthy weight. We will update supply orders as indicated. Risks of untreated sleep apnea review and education materials provided. Healthy lifestyle habits encouraged. She will follow up in 1 year, sooner if needed. She verbalizes understanding and agreement with this plan.    Orders Placed This Encounter  Procedures   For home use only DME continuous positive airway pressure (CPAP)    Heated Humidity with all supplies as needed    Length of Need:   Lifetime    Patient has OSA or probable OSA:   Yes    Is the patient currently using CPAP in the home:   Yes    Settings:   Other see comments    CPAP supplies needed:   Mask, headgear, cushions, filters, heated tubing and water chamber     No orders of the defined types were placed in this encounter.     Greig Forbes, FNP-C 01/03/2024, 10:32 AM [facility] [address] [place], [place] [address] [phone]

## 2023-12-29 NOTE — Patient Instructions (Signed)

## 2023-12-30 ENCOUNTER — Telehealth: Payer: Self-pay | Admitting: *Deleted

## 2023-12-30 NOTE — Telephone Encounter (Signed)
 Left message on pt voicemail to bring cpap machine along with power cord to visit on 01/03/24.

## 2023-12-30 NOTE — Progress Notes (Signed)
 LM for pt to bring cpap machine with power cord.

## 2024-01-03 ENCOUNTER — Ambulatory Visit (INDEPENDENT_AMBULATORY_CARE_PROVIDER_SITE_OTHER): Payer: Self-pay | Admitting: Family Medicine

## 2024-01-03 ENCOUNTER — Encounter (INDEPENDENT_AMBULATORY_CARE_PROVIDER_SITE_OTHER): Payer: Self-pay | Admitting: Family Medicine

## 2024-01-03 ENCOUNTER — Encounter: Payer: Self-pay | Admitting: Family Medicine

## 2024-01-03 ENCOUNTER — Ambulatory Visit: Payer: Medicaid Other | Admitting: Family Medicine

## 2024-01-03 VITALS — BP 139/82 | HR 84 | Ht 61.0 in | Wt 230.0 lb

## 2024-01-03 VITALS — BP 126/76 | HR 90 | Temp 98.1°F | Ht 61.0 in | Wt 226.0 lb

## 2024-01-03 DIAGNOSIS — Z6841 Body Mass Index (BMI) 40.0 and over, adult: Secondary | ICD-10-CM

## 2024-01-03 DIAGNOSIS — E1122 Type 2 diabetes mellitus with diabetic chronic kidney disease: Secondary | ICD-10-CM | POA: Diagnosis not present

## 2024-01-03 DIAGNOSIS — M79604 Pain in right leg: Secondary | ICD-10-CM | POA: Diagnosis not present

## 2024-01-03 DIAGNOSIS — G4733 Obstructive sleep apnea (adult) (pediatric): Secondary | ICD-10-CM | POA: Diagnosis not present

## 2024-01-03 DIAGNOSIS — N1832 Chronic kidney disease, stage 3b: Secondary | ICD-10-CM | POA: Diagnosis not present

## 2024-01-03 DIAGNOSIS — Z7985 Long-term (current) use of injectable non-insulin antidiabetic drugs: Secondary | ICD-10-CM

## 2024-01-03 MED ORDER — SEMAGLUTIDE (2 MG/DOSE) 8 MG/3ML ~~LOC~~ SOPN
2.0000 mg | PEN_INJECTOR | SUBCUTANEOUS | 1 refills | Status: AC
Start: 2024-01-03 — End: ?

## 2024-01-03 NOTE — Progress Notes (Signed)
 SUBJECTIVE:  Chief Complaint: Obesity  Interim History: Patient had colonoscopy since last appointment- she hd 5 polyps removed.  She was still found to have fatty liver and diverticulosis.  She went and got evaluated for hip pain and had an inflammatory marker elevated but then was worked up for autoimmune disease.  She saw her nephrologist and was told to not take in much diary.  She was concerned about cardiac calcium  scan results.  Was told to take away bananas, red meat, cheese, yogurt and limiting of carbs.  Tamara Osborne is here to discuss her progress with her obesity treatment plan. She is on the keeping a food journal and adhering to recommended goals of 1600 calories and 95 grams of protein and states she is following her eating plan approximately 90 % of the time. She states she is exercising 45 minutes 2 times per week and walking every day.   OBJECTIVE: Visit Diagnoses: Problem List Items Addressed This Visit       Endocrine   Type 2 diabetes mellitus with stage 3b chronic kidney disease, without long-term current use of insulin  (HCC)   Relevant Medications   Semaglutide , 2 MG/DOSE, 8 MG/3ML SOPN     Other   Morbid obesity (HCC)   Relevant Medications   Semaglutide , 2 MG/DOSE, 8 MG/3ML SOPN   Other Visit Diagnoses       Chronic kidney disease, stage 3b (HCC)    -  Primary     Pain in right leg         BMI 40.0-44.9, adult (HCC), Current BMI 43.46       Relevant Medications   Semaglutide , 2 MG/DOSE, 8 MG/3ML SOPN       Vitals Temp: 98.1 F (36.7 C) BP: 126/76 Pulse Rate: 90 SpO2: 96 %   Anthropometric Measurements Height: 5' 1 (1.549 m) Weight: 226 lb (102.5 kg) BMI (Calculated): 42.72 Weight at Last Visit: 230 lb Weight Lost Since Last Visit: 4 Weight Gained Since Last Visit: 0 Starting Weight: 208 lb Total Weight Loss (lbs): 0 lb (0 kg)   Body Composition  Body Fat %: 48.2 % Fat Mass (lbs): 109 lbs Muscle Mass (lbs): 111.2 lbs Total Body Water  (lbs): 86.2 lbs Visceral Fat Rating : 15   Other Clinical Data Today's Visit #: 56 Starting Date: 09/20/19 Comments: 1600/95     ASSESSMENT AND PLAN: Assessment & Plan Chronic kidney disease, stage 3b (HCC) Sees nephrology and was told to avoid significant protein ingestion due to kidney disease.  Will request notes from prior appointments with nephrology to further guide protein intake plan Pain in right leg Patient has been seeking further evaluation for her leg pain and what etiology is.  She has further appointments coming up.  Will follow up on treatment plan after that appointment at our next office visit Morbid obesity (HCC)  BMI 40.0-44.9, adult (HCC), Current BMI 43.46  Type 2 diabetes mellitus with stage 3b chronic kidney disease, without long-term current use of insulin  (HCC) Doing well on semaglutide  without any side effects.  Refill needed today.  No change in current treatment.   Diet: Tamara Osborne is currently in the action stage of change. As such, her goal is to continue with weight loss efforts and has agreed to keeping a food journal and adhering to recommended goals of 1600 calories and 95 or more grams protein.   Exercise:  For substantial health benefits, adults should do at least 150 minutes (2 hours and 30 minutes) a week  of moderate-intensity, or 75 minutes (1 hour and 15 minutes) a week of vigorous-intensity aerobic physical activity, or an equivalent combination of moderate- and vigorous-intensity aerobic activity. Aerobic activity should be performed in episodes of at least 10 minutes, and preferably, it should be spread throughout the week.  Behavior Modification:  We discussed the following Behavioral Modification Strategies today: increasing lean protein intake, decreasing simple carbohydrates, meal planning and cooking strategies, planning for success, and keep a strict food journal.   Return in about 6 weeks (around 02/14/2024).   She was informed of  the importance of frequent follow up visits to maximize her success with intensive lifestyle modifications for her multiple health conditions.  Attestation Statements:   Reviewed by clinician on day of visit: allergies, medications, problem list, medical history, surgical history, family history, social history, and previous encounter notes.  Tamara Cho, MD

## 2024-01-04 NOTE — Progress Notes (Deleted)
 Office Visit Note  Patient: OMAH Osborne             Date of Birth: November 28, 1975           MRN: [id number]             PCP: Knute Thersia Bitters, FNP Referring: Knute Thersia Bitters, * Visit Date: 01/18/2024 Occupation: Data Unavailable  Subjective:  No chief complaint on file.   History of Present Illness: Tamara Osborne is a 48 y.o. female ***     Activities of Daily Living:  Patient reports morning stiffness for *** {minute/hour:19697}.   Patient {ACTIONS;DENIES/REPORTS:21021675::Denies} nocturnal pain.  Difficulty dressing/grooming: {ACTIONS;DENIES/REPORTS:21021675::Denies} Difficulty climbing stairs: {ACTIONS;DENIES/REPORTS:21021675::Denies} Difficulty getting out of chair: {ACTIONS;DENIES/REPORTS:21021675::Denies} Difficulty using hands for taps, buttons, cutlery, and/or writing: {ACTIONS;DENIES/REPORTS:21021675::Denies}  No Rheumatology ROS completed.   PMFS History:  Patient Active Problem List   Diagnosis Date Noted   Pancreatic cyst 10/09/2023   Diverticulosis 10/09/2023   Hepatic steatosis 08/15/2023   Morbid obesity (HCC) 07/10/2023   History of radiation therapy 05/21/2023   Difficult intravenous access 05/21/2023   HSV-2 infection 07/13/2022   Dyspareunia in female 10/23/2021   Hyperlipidemia associated with type 2 diabetes mellitus (HCC) 07/03/2021   Type 2 diabetes mellitus with stage 3b chronic kidney disease, without long-term current use of insulin  (HCC) 11/15/2020   OSA (obstructive sleep apnea) 07/31/2020   Other hyperlipidemia 01/10/2020   Class 2 severe obesity with serious comorbidity and body mass index (BMI) of 39.0 to 39.9 in adult 01/10/2020   Peripheral neuropathy due to chemotherapy 04/25/2019   CKD (chronic kidney disease), symptom management only, stage 3 (moderate) (HCC) 03/30/2019   Generalized anxiety disorder 03/30/2019   Hypertension associated with type 2 diabetes mellitus (HCC)    Squamous cell carcinoma of  cervix (HCC) 03/23/2019   Deficiency anemia 03/22/2019    Past Medical History:  Diagnosis Date   Anemia    Angio-edema    Anxiety    Arthritis    Bartholin cyst    Cervical cancer (HCC)    Chronic kidney disease    Constipation    Depression    Deviated septum    Diabetes mellitus without complication (HCC)    Difficult intravenous access    used port for 05-09-2019 surgery   Fibroids    Genetic testing 10/11/2023   Negative CancerNext-Expanded +RNAinsight panel. The CancerNext-Expanded gene panel offered by [facility] and includes sequencing, rearrangement, and RNA analysis for the following 77 genes: AIP, ALK, APC, ATM, AXIN2, BAP1, BARD1, BMPR1A, BRCA1, BRCA2, BRIP1, CDC73, CDH1, CDK4, CDKN1B, CDKN2A, CEBPA, CHEK2, CTNNA1, DDX41, DICER1, EGFR, EPCAM, ETV6, FH, FLCN, GATA2, GREM1, HOXB13, KIT, LZTR1, MA   History of blood transfusion    2 units given 04-26-2019, has had total of 8 units since jan 2021   History of blood transfusion 1 unit given 05-30-19   iv fluids also given   History of cervical cancer 07/16/2022   History of kidney problems    History of radiation therapy 03/23/2019-05/04/2019   Cervical external beam   Dr Lynwood Nasuti   History of radiation therapy 05/09/2019-06/05/2019   vaginal brachytherapy   Dr Lynwood Nasuti   History of recent blood transfusion 05/16/2019   2 units given  per dr melrose at [facility]   Hyperlipidemia    Hypertension    Neuropathy    both thumbs   Obesity    Palpitations    PONV (postoperative nausea and vomiting)    Prediabetes  Preeclampsia 2006   Sleep apnea    no cpap used insurance would not cover, osa severe per pt   SOB (shortness of breath)    Squamous cell carcinoma of cervix (HCC) 03/23/2019   Swallowing difficulty    Symptomatic skin lesion 07/14/2022   Vitamin D  deficiency 01/10/2020    Family History  Problem Relation Age of Onset   Brain cancer Mother 86   Hypertension Mother    Depression Mother    Anxiety  disorder Mother    Lung cancer Mother 4   Diabetes Father    Kidney disease Father    Kidney cancer Father 66   Heart attack Sister    Cervical cancer Sister 59   Arthritis Sister    Heart failure Paternal Aunt    Heart attack Paternal Uncle    Cancer Maternal Grandmother        possibly lung cancer?   Kidney failure Maternal Grandfather    Leukemia Paternal Grandmother 57   Heart attack Paternal Grandfather    Brain cancer Paternal Cousin 98   Cervical cancer Maternal Cousin    Tourette syndrome Son    Healthy Son    Healthy Daughter    Healthy Daughter    Colon cancer Neg Hx    Esophageal cancer Neg Hx    Inflammatory bowel disease Neg Hx    Liver disease Neg Hx    Pancreatic cancer Neg Hx    Rectal cancer Neg Hx    Stomach cancer Neg Hx    Past Surgical History:  Procedure Laterality Date   CESAREAN SECTION WITH BILATERAL TUBAL LIGATION  11/10/2004       COLONOSCOPY  12/14/2023   DILATION AND CURETTAGE OF UTERUS  1997   IR IMAGING GUIDED PORT INSERTION  04/04/2019   IR REMOVAL TUN ACCESS W/ PORT W/O FL MOD SED  10/27/2021   OPERATIVE ULTRASOUND N/A 05/09/2019   Procedure: OPERATIVE ULTRASOUND;  Surgeon: Shannon Agent, MD;  Location: [facility];  Service: Urology;  Laterality: N/A;   OPERATIVE ULTRASOUND N/A 05/15/2019   Procedure: OPERATIVE ULTRASOUND;  Surgeon: Shannon Agent, MD;  Location: [facility];  Service: Urology;  Laterality: N/A;   OPERATIVE ULTRASOUND N/A 05/22/2019   Procedure: OPERATIVE ULTRASOUND;  Surgeon: Shannon Agent, MD;  Location: [facility];  Service: Urology;  Laterality: N/A;   OPERATIVE ULTRASOUND N/A 05/29/2019   Procedure: OPERATIVE ULTRASOUND;  Surgeon: Shannon Agent, MD;  Location: [facility];  Service: Urology;  Laterality: N/A;   OPERATIVE ULTRASOUND N/A 06/05/2019   Procedure: OPERATIVE ULTRASOUND;  Surgeon: Shannon Agent, MD;  Location: [facility];   Service: Urology;  Laterality: N/A;   TANDEM RING INSERTION N/A 05/09/2019   Procedure: TANDEM RING INSERTION;  Surgeon: Shannon Agent, MD;  Location: [facility];  Service: Urology;  Laterality: N/A;   TANDEM RING INSERTION N/A 05/15/2019   Procedure: TANDEM RING INSERTION;  Surgeon: Shannon Agent, MD;  Location: [facility];  Service: Urology;  Laterality: N/A;   TANDEM RING INSERTION N/A 05/22/2019   Procedure: TANDEM RING INSERTION;  Surgeon: Shannon Agent, MD;  Location: [facility];  Service: Urology;  Laterality: N/A;   TANDEM RING INSERTION N/A 05/29/2019   Procedure: TANDEM RING INSERTION;  Surgeon: Shannon Agent, MD;  Location: [facility];  Service: Urology;  Laterality: N/A;   TANDEM RING INSERTION N/A 06/05/2019   Procedure: TANDEM RING INSERTION;  Surgeon: Shannon Agent, MD;  [facility];  Service: Urology;  Laterality: N/A;   Social History   Tobacco Use   Smoking status: Never    Passive exposure: Past   Smokeless tobacco: Never  Vaping Use   Vaping status: Never Used  Substance Use Topics   Alcohol use: No   Drug use: No   Social History   Social History Narrative   Lives with her boyfriend      There is no immunization history on file for this patient.   Objective: Vital Signs: LMP  (LMP Unknown)    Physical Exam   Musculoskeletal Exam: ***  CDAI Exam: CDAI Score: -- Patient Global: --; Provider Global: -- Swollen: --; Tender: -- Joint Exam 01/18/2024   No joint exam has been documented for this visit   There is currently no information documented on the homunculus. Go to the Rheumatology activity and complete the homunculus joint exam.  Investigation: No additional findings.  Imaging: XR HIP UNILAT W OR W/O PELVIS 2-3 VIEWS RIGHT Result Date: 12/23/2023 No hip joint narrowing was noted.  No chondrocalcinosis was noted.  No SI joint narrowing or sclerosis was  noted. Impression: Unremarkable x-rays of the hip.  CT CARDIAC SCORING (SELF PAY ONLY) Addendum Date: 12/21/2023 ADDENDUM REPORT: 12/21/2023 15:14 EXAM: OVER-READ INTERPRETATION  CT CHEST The following report is an over-read performed by radiologist Dr. Andrea Gasman of [facility] on 12/21/2023. This over-read does not include interpretation of cardiac or coronary anatomy or pathology. The coronary calcium  score interpretation by the cardiologist is attached. COMPARISON:  None. FINDINGS: Vascular: Aortic atherosclerosis. The included aorta is normal in caliber. Mediastinum/nodes: No adenopathy or mass.  Decompressed esophagus. Lungs: No focal airspace disease. No suspicious pulmonary nodule. No pleural fluid. The included airways are patent. Upper abdomen: No acute findings.  Diffuse hepatic steatosis. Musculoskeletal: There are no acute or suspicious osseous abnormalities. IMPRESSION: 1.  Aortic Atherosclerosis (ICD10-I70.0). 2. Hepatic steatosis. Electronically Signed   By: Andrea Gasman M.D.   On: 12/21/2023 15:14   Result Date: 12/21/2023 CLINICAL DATA:  Risk stratification: 48 Year-old Female EXAM: Coronary Calcium  Score TECHNIQUE: A gated, non-contrast computed tomography scan of the heart was performed using 2.5 mm slice thickness. Axial images were analyzed on a dedicated workstation. Calcium  scoring of the coronary arteries was performed using the Agatston method. FINDINGS: Coronary Calcium  Score: Left main: 0 Left anterior descending artery: 145 Left circumflex artery: 222 Right coronary artery: 20 Total: 387 Percentile: 99th Ascending Aorta: Normal caliber. Aortic atherosclerosis. Aortic Valve Calcium  score: 26 Mitral annular calcification: Minimal Pericardium: Normal. Non-cardiac: See separate report from [facility]. IMPRESSION: 1. Coronary calcium  score of 387. This was 99th percentile for age-, race-, and sex-matched controls. RECOMMENDATIONS: Coronary artery calcium   (CAC) score is a strong predictor of incident coronary heart disease (CHD) and provides predictive information beyond traditional risk factors. CAC scoring is reasonable to use in the decision to withhold, postpone, or initiate statin therapy in intermediate-risk or selected borderline-risk asymptomatic adults (age 47-75 years and LDL-C >=70 to <190 mg/dL) who do not have diabetes or established atherosclerotic cardiovascular disease (ASCVD).* In intermediate-risk (10-year ASCVD risk >=7.5% to <20%) adults or selected borderline-risk (10-year ASCVD risk >=5% to <7.5%) adults in whom a CAC score is measured for the purpose of making a treatment decision the following recommendations have been made: If CAC=0, it is reasonable to withhold statin therapy and reassess in 5 to 10 years, as long as higher risk conditions are absent (diabetes mellitus,  family history of premature CHD in first degree relatives (males <55 years; females <65 years), cigarette smoking, or LDL >=190 mg/dL). If CAC is 1 to 99, it is reasonable to initiate statin therapy for patients >=38 years of age. If CAC is >=100 or >=75th percentile, it is reasonable to initiate statin therapy at any age. Cardiology referral should be considered for patients with CAC scores >=400 or >=75th percentile. *2018 AHA/ACC/AACVPR/AAPA/ABC/ACPM/ADA/AGS/APhA/ASPC/NLA/PCNA Guideline on the Management of Blood Cholesterol: A Report of the American College of Cardiology/American Heart Association Task Force on Clinical Practice Guidelines. J Am Coll Cardiol. 2019;73(24):3168-3209. Stanly Leavens, MD Electronically Signed: By: Stanly Leavens M.D. On: 12/20/2023 16:02    Recent Labs: Lab Results  Component Value Date   WBC 9.3 08/06/2022   HGB 15.3 08/06/2022   PLT 326 08/06/2022   NA 140 12/27/2023   K 4.4 12/27/2023   CL 102 12/27/2023   CO2 24 (A) 12/27/2023   GLUCOSE 94 08/06/2022   BUN 22 (A) 12/27/2023   CREATININE 1.6 (A) 12/27/2023    BILITOT 0.3 08/06/2022   ALKPHOS 97 08/06/2022   AST 27 08/06/2022   ALT 48 (H) 08/06/2022   PROT 7.8 08/06/2022   ALBUMIN 4.4 12/09/2022   CALCIUM  10.1 12/27/2023   GFRAA 55 (L) 09/20/2019   December 23, 2023 beta-2 GP 1 negative, anticardiolipin negative, C3-C4 normal, ENA (SCL 70, RNP, Smith, SSA, SSB, dsDNA) negative, CK 71 December 27, 2023 GFR 41  June 09, 2023 magnesium  2.0, urine protein creatinine ratio 1152, UA 2+ protein, positive WBCs, positive bacteria, PTH 27, ANA 1: 80 speckled , C3 173, C4 25, MPO antibody negative, PR-3 antibody negative, BMP glucose 224, creatinine 1.37, GFR 48, hemoglobin 14.5   Speciality Comments: No specialty comments available.  Procedures:  No procedures performed Allergies: Shellfish allergy , Atorvastatin , Bee venom, Penicillins, and Sulfa antibiotics   Assessment / Plan:     Visit Diagnoses: No diagnosis found.  Orders: No orders of the defined types were placed in this encounter.  No orders of the defined types were placed in this encounter.   Face-to-face time spent with patient was *** minutes. Greater than 50% of time was spent in counseling and coordination of care.  Follow-Up Instructions: No follow-ups on file.   Maya Nash, MD  Note - This record has been created using Animal nutritionist.  Chart creation errors have been sought, but may not always  have been located. Such creation errors do not reflect on  the standard of medical care.

## 2024-01-05 MED ORDER — ROSUVASTATIN CALCIUM 20 MG PO TABS: 20.0000 mg | ORAL_TABLET | Freq: Every day | ORAL | 3 refills | Status: AC

## 2024-01-09 NOTE — Assessment & Plan Note (Signed)
 Doing well on semaglutide  without any side effects.  Refill needed today.  No change in current treatment.

## 2024-01-18 ENCOUNTER — Ambulatory Visit: Admitting: Rheumatology

## 2024-01-18 DIAGNOSIS — G620 Drug-induced polyneuropathy: Secondary | ICD-10-CM

## 2024-01-18 DIAGNOSIS — K5790 Diverticulosis of intestine, part unspecified, without perforation or abscess without bleeding: Secondary | ICD-10-CM

## 2024-01-18 DIAGNOSIS — I152 Hypertension secondary to endocrine disorders: Secondary | ICD-10-CM

## 2024-01-18 DIAGNOSIS — E1122 Type 2 diabetes mellitus with diabetic chronic kidney disease: Secondary | ICD-10-CM

## 2024-01-18 DIAGNOSIS — C539 Malignant neoplasm of cervix uteri, unspecified: Secondary | ICD-10-CM

## 2024-01-18 DIAGNOSIS — G4733 Obstructive sleep apnea (adult) (pediatric): Secondary | ICD-10-CM

## 2024-01-18 DIAGNOSIS — M791 Myalgia, unspecified site: Secondary | ICD-10-CM

## 2024-01-18 DIAGNOSIS — Z923 Personal history of irradiation: Secondary | ICD-10-CM

## 2024-01-18 DIAGNOSIS — B009 Herpesviral infection, unspecified: Secondary | ICD-10-CM

## 2024-01-18 DIAGNOSIS — M2550 Pain in unspecified joint: Secondary | ICD-10-CM

## 2024-01-18 DIAGNOSIS — M25551 Pain in right hip: Secondary | ICD-10-CM

## 2024-01-18 DIAGNOSIS — K760 Fatty (change of) liver, not elsewhere classified: Secondary | ICD-10-CM

## 2024-01-18 DIAGNOSIS — N183 Chronic kidney disease, stage 3 unspecified: Secondary | ICD-10-CM

## 2024-01-18 DIAGNOSIS — R7689 Other specified abnormal immunological findings in serum: Secondary | ICD-10-CM

## 2024-01-18 DIAGNOSIS — E66812 Obesity, class 2: Secondary | ICD-10-CM

## 2024-01-18 DIAGNOSIS — E1169 Type 2 diabetes mellitus with other specified complication: Secondary | ICD-10-CM

## 2024-01-18 DIAGNOSIS — F411 Generalized anxiety disorder: Secondary | ICD-10-CM

## 2024-01-18 DIAGNOSIS — M7061 Trochanteric bursitis, right hip: Secondary | ICD-10-CM

## 2024-02-14 ENCOUNTER — Ambulatory Visit (INDEPENDENT_AMBULATORY_CARE_PROVIDER_SITE_OTHER): Admitting: Family Medicine

## 2024-02-14 ENCOUNTER — Encounter (INDEPENDENT_AMBULATORY_CARE_PROVIDER_SITE_OTHER): Payer: Self-pay | Admitting: Family Medicine

## 2024-02-14 VITALS — BP 151/86 | HR 87 | Temp 98.0°F | Ht 61.0 in | Wt 221.0 lb

## 2024-02-14 DIAGNOSIS — E1122 Type 2 diabetes mellitus with diabetic chronic kidney disease: Secondary | ICD-10-CM

## 2024-02-14 DIAGNOSIS — Z7985 Long-term (current) use of injectable non-insulin antidiabetic drugs: Secondary | ICD-10-CM

## 2024-02-14 DIAGNOSIS — Z6841 Body Mass Index (BMI) 40.0 and over, adult: Secondary | ICD-10-CM

## 2024-02-14 DIAGNOSIS — E785 Hyperlipidemia, unspecified: Secondary | ICD-10-CM

## 2024-02-14 DIAGNOSIS — E1169 Type 2 diabetes mellitus with other specified complication: Secondary | ICD-10-CM

## 2024-02-14 DIAGNOSIS — J3489 Other specified disorders of nose and nasal sinuses: Secondary | ICD-10-CM | POA: Diagnosis not present

## 2024-02-14 DIAGNOSIS — N1832 Chronic kidney disease, stage 3b: Secondary | ICD-10-CM | POA: Diagnosis not present

## 2024-02-14 MED ORDER — SEMAGLUTIDE (2 MG/DOSE) 8 MG/3ML ~~LOC~~ SOPN: 2.0000 mg | PEN_INJECTOR | SUBCUTANEOUS | 1 refills | Status: AC

## 2024-02-14 NOTE — Progress Notes (Signed)
 "  SUBJECTIVE:  Chief Complaint: Obesity  Interim History: patient had a very difficult last month- her best friend died due to complications of type 1 diabetes.  She mentions she could barely eat after finding out about her friend.  She feels it hit her very hard due to lack of expectation.  She is having some wheeze and congestion and she felt it was possibly due to her CPAP machine.  She recently was switched onto a different cholesterol medication and is having left calf pain.   Tamara Osborne is here to discuss her progress with her obesity treatment plan. She is on the keeping a food journal and adhering to recommended goals of 1600 calories and 95 grams of protein and states she is following her eating plan approximately 50 % of the time. She states she is not exercising.  OBJECTIVE: Visit Diagnoses: Problem List Items Addressed This Visit       Endocrine   Type 2 diabetes mellitus with stage 3b chronic kidney disease, without long-term current use of insulin  (HCC) - Primary   Relevant Medications   Semaglutide , 2 MG/DOSE, 8 MG/3ML SOPN   Hyperlipidemia associated with type 2 diabetes mellitus (HCC)   Relevant Medications   Semaglutide , 2 MG/DOSE, 8 MG/3ML SOPN     Other   Morbid obesity (HCC)   Relevant Medications   Semaglutide , 2 MG/DOSE, 8 MG/3ML SOPN   Other Visit Diagnoses       BMI 40.0-44.9, adult (HCC), Current BMI 43.46       Relevant Medications   Semaglutide , 2 MG/DOSE, 8 MG/3ML SOPN     Sinus drainage       Relevant Orders   Ambulatory referral to ENT       Vitals Temp: 98 F (36.7 C) BP: (!) 151/86 Pulse Rate: 87 SpO2: 96 %   Anthropometric Measurements Height: 5' 1 (1.549 m) Weight: 221 lb (100.2 kg) BMI (Calculated): 41.78 Weight at Last Visit: 226 lb Weight Lost Since Last Visit: 5 Weight Gained Since Last Visit: 0 Starting Weight: 208 lb Total Weight Loss (lbs): 0 lb (0 kg)   Body Composition  Body Fat %: 47.1 % Fat Mass (lbs): 104.4  lbs Muscle Mass (lbs): 111.2 lbs Total Body Water (lbs): 81.4 lbs Visceral Fat Rating : 14   Other Clinical Data Today's Visit #: 46 Starting Date: 09/20/19 Comments: 1600/95     ASSESSMENT AND PLAN: Assessment & Plan Type 2 diabetes mellitus with stage 3b chronic kidney disease, without long-term current use of insulin  (HCC) Tolerating Ozempic  with no GI side effects.  Needs refill of Ozempic  today.  Will continue current dose as patient is able to get good intake, have good blood sugar control, and has no side effects. Sinus drainage Patient has had prolonged sinus drainage and sinus pressure.  She is interested in further evaluation.  Will refer to ENT for further evaluation to see if patient has anatomical reasons that her sinus symptoms have not resolved. Hyperlipidemia associated with type 2 diabetes mellitus (HCC) Last fasting lipid panel done in epic in June.  LDL remains elevated.  We discussed importance of follow-up lab evaluation to assess response to dietary changes.  Plan for repeat labs in the next 2 to 3 months. BMI 40.0-44.9, adult (HCC), Current BMI 43.46  Morbid obesity (HCC)    Diet: Tamara Osborne is currently in the action stage of change. As such, her goal is to continue with weight loss efforts and has agreed to keeping a food journal and  adhering to recommended goals of 1600 calories and 95 or more grams of protein daily.   Exercise:  For additional and more extensive health benefits, adults should increase their aerobic physical activity to 300 minutes (5 hours) a week of moderate-intensity, or 150 minutes a week of vigorous-intensity aerobic physical activity, or an equivalent combination of moderate- and vigorous-intensity activity. Additional health benefits are gained by engaging in physical activity beyond this amount.   Behavior Modification:  We discussed the following Behavioral Modification Strategies today: increasing lean protein intake, decreasing  simple carbohydrates, emotional eating strategies , and holiday eating strategies.   Return in about 5 weeks (around 03/20/2024).   She was informed of the importance of frequent follow up visits to maximize her success with intensive lifestyle modifications for her multiple health conditions.  Attestation Statements:   Reviewed by clinician on day of visit: allergies, medications, problem list, medical history, surgical history, family history, social history, and previous encounter notes.     Tamara Cho, MD "

## 2024-02-16 ENCOUNTER — Inpatient Hospital Stay: Admission: RE | Admit: 2024-02-16 | Discharge: 2024-02-16

## 2024-02-16 VITALS — BP 148/87 | HR 85 | Temp 97.9°F | Resp 18

## 2024-02-16 DIAGNOSIS — R062 Wheezing: Secondary | ICD-10-CM | POA: Diagnosis not present

## 2024-02-16 DIAGNOSIS — J22 Unspecified acute lower respiratory infection: Secondary | ICD-10-CM | POA: Diagnosis not present

## 2024-02-16 DIAGNOSIS — J3089 Other allergic rhinitis: Secondary | ICD-10-CM

## 2024-02-16 MED ORDER — PROMETHAZINE-DM 6.25-15 MG/5ML PO SYRP: 5.0000 mL | ORAL_SOLUTION | Freq: Four times a day (QID) | ORAL | 0 refills | Status: DC | PRN

## 2024-02-16 MED ORDER — DOXYCYCLINE HYCLATE 100 MG PO CAPS: 100.0000 mg | ORAL_CAPSULE | Freq: Two times a day (BID) | ORAL | 0 refills | Status: DC

## 2024-02-16 MED ORDER — ALBUTEROL SULFATE HFA 108 (90 BASE) MCG/ACT IN AERS: 2.0000 | INHALATION_SPRAY | RESPIRATORY_TRACT | 0 refills | Status: DC | PRN

## 2024-02-16 NOTE — Discharge Instructions (Signed)
 Continue your allergy  medications, increase your nasal spray to twice daily, take Coricidin HBP as needed.  I have prescribed an antibiotic, albuterol  inhaler for wheezing and chest tightness and a cough syrup.  Follow-up for worsening or unresolving symptoms.

## 2024-02-16 NOTE — ED Triage Notes (Signed)
 Runny nose, congestion, cough since the beginning of November.  States now she feels wheezing in her chest.  States cough has gotten worse.

## 2024-02-18 NOTE — ED Provider Notes (Signed)
 " [facility]    CSN: [id number] Arrival date & time: 02/16/24  0856      History   Chief Complaint Chief Complaint  Patient presents with   Nasal Congestion    Entered by patient    HPI Tamara Osborne is a 48 y.o. female.   Patient presenting today with 1 month history of runny nose, nasal congestion, sinus pain and pressure, cough that has worsened significantly and is now wheezing, shortness of breath associated.  Denies fever, chest pain, abdominal pain, nausea vomiting or diarrhea.  So far tried multiple over-the-counter remedies with minimal relief.  History of seasonal allergies on as needed regimen.  No known chronic pulmonary disease but is very prone to bronchitis per patient.    Past Medical History:  Diagnosis Date   Anemia    Angio-edema    Anxiety    Arthritis    Bartholin cyst    Cervical cancer (HCC)    Chronic kidney disease    Constipation    Depression    Deviated septum    Diabetes mellitus without complication (HCC)    Difficult intravenous access    used port for 05-09-2019 surgery   Fibroids    Genetic testing 10/11/2023   Negative CancerNext-Expanded +RNAinsight panel. The CancerNext-Expanded gene panel offered by [facility] and includes sequencing, rearrangement, and RNA analysis for the following 77 genes: AIP, ALK, APC, ATM, AXIN2, BAP1, BARD1, BMPR1A, BRCA1, BRCA2, BRIP1, CDC73, CDH1, CDK4, CDKN1B, CDKN2A, CEBPA, CHEK2, CTNNA1, DDX41, DICER1, EGFR, EPCAM, ETV6, FH, FLCN, GATA2, GREM1, HOXB13, KIT, LZTR1, MA   History of blood transfusion    2 units given 04-26-2019, has had total of 8 units since jan 2021   History of blood transfusion 1 unit given 05-30-19   iv fluids also given   History of cervical cancer 07/16/2022   History of kidney problems    History of radiation therapy 03/23/2019-05/04/2019   Cervical external beam   Dr Lynwood Nasuti   History of radiation therapy 05/09/2019-06/05/2019   vaginal brachytherapy   Dr Lynwood Nasuti   History of recent blood transfusion 05/16/2019   2 units given  per dr melrose [facility]   Hyperlipidemia    Hypertension    Neuropathy    both thumbs   Obesity    Palpitations    PONV (postoperative nausea and vomiting)    Prediabetes    Preeclampsia 2006   Sleep apnea    no cpap used insurance would not cover, osa severe per pt   SOB (shortness of breath)    Squamous cell carcinoma of cervix (HCC) 03/23/2019   Swallowing difficulty    Symptomatic skin lesion 07/14/2022   Vitamin D  deficiency 01/10/2020    Patient Active Problem List   Diagnosis Date Noted   Pancreatic cyst 10/09/2023   Diverticulosis 10/09/2023   Hepatic steatosis 08/15/2023   Morbid obesity (HCC) 07/10/2023   History of radiation therapy 05/21/2023   Difficult intravenous access 05/21/2023   HSV-2 infection 07/13/2022   Dyspareunia in female 10/23/2021   Hyperlipidemia associated with type 2 diabetes mellitus (HCC) 07/03/2021   Type 2 diabetes mellitus with stage 3b chronic kidney disease, without long-term current use of insulin  (HCC) 11/15/2020   OSA (obstructive sleep apnea) 07/31/2020   Other hyperlipidemia 01/10/2020   Class 2 severe obesity with serious comorbidity and body mass index (BMI) of 39.0 to 39.9 in adult 01/10/2020   Peripheral neuropathy due to chemotherapy 04/25/2019   CKD (  chronic kidney disease), symptom management only, stage 3 (moderate) (HCC) 03/30/2019   Generalized anxiety disorder 03/30/2019   Hypertension associated with type 2 diabetes mellitus (HCC)    Squamous cell carcinoma of cervix (HCC) 03/23/2019   Deficiency anemia 03/22/2019    Past Surgical History:  Procedure Laterality Date   CESAREAN SECTION WITH BILATERAL TUBAL LIGATION  11/10/2004       COLONOSCOPY  12/14/2023   DILATION AND CURETTAGE OF UTERUS  1997   IR IMAGING GUIDED PORT INSERTION  04/04/2019   IR REMOVAL TUN ACCESS W/ PORT W/O FL MOD SED  10/27/2021   OPERATIVE ULTRASOUND N/A  05/09/2019   Procedure: OPERATIVE ULTRASOUND;  Surgeon: Shannon Agent, MD;  Location: [facility];  Service: Urology;  Laterality: N/A;   OPERATIVE ULTRASOUND N/A 05/15/2019   Procedure: OPERATIVE ULTRASOUND;  Surgeon: Shannon Agent, MD;  Location: [facility];  Service: Urology;  Laterality: N/A;   OPERATIVE ULTRASOUND N/A 05/22/2019   Procedure: OPERATIVE ULTRASOUND;  Surgeon: Shannon Agent, MD;  Location: [facility];  Service: Urology;  Laterality: N/A;   OPERATIVE ULTRASOUND N/A 05/29/2019   Procedure: OPERATIVE ULTRASOUND;  Surgeon: Shannon Agent, MD;  Location: [facility];  Service: Urology;  Laterality: N/A;   OPERATIVE ULTRASOUND N/A 06/05/2019   Procedure: OPERATIVE ULTRASOUND;  Surgeon: Shannon Agent, MD;  Location: [facility];  Service: Urology;  Laterality: N/A;   TANDEM RING INSERTION N/A 05/09/2019   Procedure: TANDEM RING INSERTION;  Surgeon: Shannon Agent, MD;  Location: [facility];  Service: Urology;  Laterality: N/A;   TANDEM RING INSERTION N/A 05/15/2019   Procedure: TANDEM RING INSERTION;  Surgeon: Shannon Agent, MD;  Location: [facility];  Service: Urology;  Laterality: N/A;   TANDEM RING INSERTION N/A 05/22/2019   Procedure: TANDEM RING INSERTION;  Surgeon: Shannon Agent, MD;  Location: [facility];  Service: Urology;  Laterality: N/A;   TANDEM RING INSERTION N/A 05/29/2019   Procedure: TANDEM RING INSERTION;  Surgeon: Shannon Agent, MD;  Location: [facility];  Service: Urology;  Laterality: N/A;   TANDEM RING INSERTION N/A 06/05/2019   Procedure: TANDEM RING INSERTION;  Surgeon: Shannon Agent, MD;  Location: [facility];  Service: Urology;  Laterality: N/A;    OB History     Gravida  6   Para  4   Term  3   Preterm  1   AB  2   Living  3      SAB  2   IAB      Ectopic      Multiple      Live  Births  3            Home Medications    Prior to Admission medications  Medication Sig Start Date End Date Taking? Authorizing Provider  albuterol  (VENTOLIN  HFA) 108 (90 Base) MCG/ACT inhaler Inhale 2 puffs into the lungs every 4 (four) hours as needed. 02/16/24  Yes Stuart Vernell Norris, PA-C  doxycycline  (VIBRAMYCIN ) 100 MG capsule Take 1 capsule (100 mg total) by mouth 2 (two) times daily. 02/16/24  Yes Stuart Vernell Norris, PA-C  promethazine -dextromethorphan (PROMETHAZINE -DM) 6.25-15 MG/5ML syrup Take 5 mLs by mouth 4 (four) times daily as needed. 02/16/24  Yes Stuart Vernell Norris, PA-C  Accu-Chek Softclix Lancets lancets Use to check blood sugar TID. 11/14/20   Newlin, Enobong, MD  amLODipine -valsartan  (EXFORGE ) 10-320 MG tablet Take 1 tablet by mouth daily.  04/23/23   Caudle, Thersia Bitters, FNP  azelastine  (ASTELIN ) 0.1 % nasal spray Place 1 spray into both nostrils 2 (two) times daily as needed. Use in each nostril as directed 09/21/22   Tobie Arleta SQUIBB, MD  Blood Glucose Monitoring Suppl (ACCU-CHEK GUIDE) w/Device KIT Use to check blood sugar TID. 11/14/20   Newlin, Enobong, MD  buPROPion (WELLBUTRIN XL) 150 MG 24 hr tablet Take 150 mg by mouth daily. 06/28/23   [provider]  Cholecalciferol 50 MCG (2000 UT) TABS Take by mouth. 03/06/21   [provider]  conjugated estrogens  (PREMARIN ) vaginal cream Place 1 Applicatorful vaginally every other day. Patient taking differently: Place 1 applicator vaginally every 3 (three) days. 10/22/23   McKenzie, Belvie CROME, MD  EPINEPHrine  (EPIPEN  2-PAK) 0.3 mg/0.3 mL IJ SOAJ injection Inject 0.3 mg into the muscle as needed for anaphylaxis. 09/21/22   Tobie Arleta SQUIBB, MD  fluticasone  (FLONASE ) 50 MCG/ACT nasal spray Place 2 sprays into both nostrils daily. 09/21/22   Tobie Arleta SQUIBB, MD  glucose blood (ACCU-CHEK GUIDE) test strip Use to check blood sugar TID. 11/14/20   Newlin, Enobong, MD  ipratropium (ATROVENT ) 0.06 % nasal spray  Place 2 sprays into both nostrils 3 (three) times daily as needed for rhinitis (drainage). 09/21/22   Tobie Arleta SQUIBB, MD  KERENDIA 10 MG TABS Take 1 tablet by mouth daily. 12/27/23   [provider]  levocetirizine (XYZAL ) 5 MG tablet Take 1 tablet (5 mg total) by mouth every evening. 11/24/23   Caudle, Thersia Bitters, FNP  mupirocin  ointment (BACTROBAN ) 2 % Apply 1 Application topically 2 (two) times daily. 09/16/23   Stuart Vernell Norris, PA-C  pregabalin  (LYRICA ) 25 MG capsule Take 1 capsule (25 mg total) by mouth 2 (two) times daily as needed (neuropathic pain). 11/24/23   Caudle, Thersia Bitters, FNP  RESTASIS 0.05 % ophthalmic emulsion 1 drop 2 (two) times daily. 08/07/22   [provider]  rosuvastatin  (CRESTOR ) 20 MG tablet Take 1 tablet (20 mg total) by mouth daily. 01/05/24 04/04/24  Raford Riggs, MD  Semaglutide , 2 MG/DOSE, 8 MG/3ML SOPN Inject 2 mg as directed once a week. 02/14/24   [place] Adelita PENNER, MD    Family History Family History  Problem Relation Age of Onset   Brain cancer Mother 84   Hypertension Mother    Depression Mother    Anxiety disorder Mother    Lung cancer Mother 79   Diabetes Father    Kidney disease Father    Kidney cancer Father 57   Heart attack Sister    Cervical cancer Sister 10   Arthritis Sister    Heart failure Paternal Aunt    Heart attack Paternal Uncle    Cancer Maternal Grandmother        possibly lung cancer?   Kidney failure Maternal Grandfather    Leukemia Paternal Grandmother 52   Heart attack Paternal Grandfather    Brain cancer Paternal Cousin 32   Cervical cancer Maternal Cousin    Tourette syndrome Son    Healthy Son    Healthy Daughter    Healthy Daughter    Colon cancer Neg Hx    Esophageal cancer Neg Hx    Inflammatory bowel disease Neg Hx    Liver disease Neg Hx    Pancreatic cancer Neg Hx    Rectal cancer Neg Hx    Stomach cancer Neg Hx     Social History Social History[1]   Allergies    Shellfish allergy ,  Atorvastatin , Bee venom, Penicillins, and Sulfa antibiotics   Review of Systems Review of Systems Per HPI  Physical Exam Triage Vital Signs ED Triage Vitals  Encounter Vitals Group     BP 02/16/24 0906 (!) 148/87     Girls Systolic BP Percentile --      Girls Diastolic BP Percentile --      Boys Systolic BP Percentile --      Boys Diastolic BP Percentile --      Pulse Rate 02/16/24 0906 85     Resp 02/16/24 0906 18     Temp 02/16/24 0906 97.9 F (36.6 C)     Temp Source 02/16/24 0906 Oral     SpO2 02/16/24 0906 93 %     Weight --      Height --      Head Circumference --      Peak Flow --      Pain Score 02/16/24 0908 0     Pain Loc --      Pain Education --      Exclude from Growth Chart --    No data found.  Updated Vital Signs BP (!) 148/87 (BP Location: Right Arm)   Pulse 85   Temp 97.9 F (36.6 C) (Oral)   Resp 18   LMP  (LMP Unknown)   SpO2 93%   Visual Acuity Right Eye Distance:   Left Eye Distance:   Bilateral Distance:    Right Eye Near:   Left Eye Near:    Bilateral Near:     Physical Exam Vitals and nursing note reviewed.  Constitutional:      Appearance: Normal appearance.  HENT:     Head: Atraumatic.     Right Ear: Tympanic membrane and external ear normal.     Left Ear: Tympanic membrane and external ear normal.     Nose: Congestion present.     Mouth/Throat:     Mouth: Mucous membranes are moist.     Pharynx: Posterior oropharyngeal erythema present.  Eyes:     Extraocular Movements: Extraocular movements intact.     Conjunctiva/sclera: Conjunctivae normal.  Cardiovascular:     Rate and Rhythm: Normal rate and regular rhythm.     Heart sounds: Normal heart sounds.  Pulmonary:     Effort: Pulmonary effort is normal.     Breath sounds: Wheezing present.  Musculoskeletal:        General: Normal range of motion.     Cervical back: Normal range of motion and neck supple.  Skin:    General: Skin is warm and dry.   Neurological:     Mental Status: She is alert and oriented to person, place, and time.  Psychiatric:        Mood and Affect: Mood normal.        Thought Content: Thought content normal.    UC Treatments / Results  Labs (all labs ordered are listed, but only abnormal results are displayed) Labs Reviewed - No data to display  EKG   Radiology No results found.  Procedures Procedures (including critical care time)  Medications Ordered in UC Medications - No data to display  Initial Impression / Assessment and Plan / UC Course  I have reviewed the triage vital signs and the nursing notes.  Pertinent labs & imaging results that were available during my care of the patient were reviewed by me and considered in my medical decision making (see chart for details).     Given  duration and worsening course, will treat with doxycycline , Phenergan  DM, albuterol  inhaler as needed and continue allergy  regimen as well as for the counter supportive medications as needed.  Return for worsening or unresolving symptoms.  Final Clinical Impressions(s) / UC Diagnoses   Final diagnoses:  Lower respiratory infection  Wheezing  Seasonal allergic rhinitis due to other allergic trigger     Discharge Instructions      Continue your allergy  medications, increase your nasal spray to twice daily, take Coricidin HBP as needed.  I have prescribed an antibiotic, albuterol  inhaler for wheezing and chest tightness and a cough syrup.  Follow-up for worsening or unresolving symptoms.    ED Prescriptions     Medication Sig Dispense Auth. Provider   albuterol  (VENTOLIN  HFA) 108 (90 Base) MCG/ACT inhaler Inhale 2 puffs into the lungs every 4 (four) hours as needed. 18 g Stuart Vernell Norris, PA-C   promethazine -dextromethorphan (PROMETHAZINE -DM) 6.25-15 MG/5ML syrup Take 5 mLs by mouth 4 (four) times daily as needed. 100 mL Stuart Vernell Norris, PA-C   doxycycline  (VIBRAMYCIN ) 100 MG capsule Take 1  capsule (100 mg total) by mouth 2 (two) times daily. 14 capsule Stuart Vernell Norris, [place]      PDMP not reviewed this encounter.    [1]  Social History Tobacco Use   Smoking status: Never    Passive exposure: Past   Smokeless tobacco: Never  Vaping Use   Vaping status: Never Used  Substance Use Topics   Alcohol use: No   Drug use: No     Stuart Vernell Norris, PA-C 02/18/24 1606  "

## 2024-02-28 NOTE — Assessment & Plan Note (Signed)
 Tolerating Ozempic  with no GI side effects.  Needs refill of Ozempic  today.  Will continue current dose as patient is able to get good intake, have good blood sugar control, and has no side effects.

## 2024-02-28 NOTE — Assessment & Plan Note (Signed)
 Last fasting lipid panel done in epic in June.  LDL remains elevated.  We discussed importance of follow-up lab evaluation to assess response to dietary changes.  Plan for repeat labs in the next 2 to 3 months.

## 2024-03-13 ENCOUNTER — Ambulatory Visit: Payer: Self-pay

## 2024-03-13 ENCOUNTER — Ambulatory Visit
Admission: RE | Admit: 2024-03-13 | Discharge: 2024-03-13 | Disposition: A | Discharge status: Home / Self Care | Source: Ambulatory Visit | Attending: Nurse Practitioner | Admitting: Nurse Practitioner

## 2024-03-13 VITALS — BP 121/88 | HR 105 | Temp 98.3°F | Resp 18

## 2024-03-13 DIAGNOSIS — R109 Unspecified abdominal pain: Secondary | ICD-10-CM | POA: Diagnosis not present

## 2024-03-13 DIAGNOSIS — R350 Frequency of micturition: Secondary | ICD-10-CM | POA: Insufficient documentation

## 2024-03-13 LAB — POCT URINE DIPSTICK
Bilirubin, UA: NEGATIVE
Glucose, UA: NEGATIVE mg/dL
Ketones, POC UA: NEGATIVE mg/dL
Leukocytes, UA: NEGATIVE
Nitrite, UA: NEGATIVE
POC PROTEIN,UA: 100 — AB
Spec Grav, UA: 1.025
Urobilinogen, UA: 0.2 U/dL
pH, UA: 6

## 2024-03-13 NOTE — ED Provider Notes (Signed)
 " [facility]    CSN: [id number] Arrival date & time: 03/13/24  1319      History   Chief Complaint Chief Complaint  Patient presents with   Abdominal Pain    I've been having a frequent urination, sore stomach and a lot of pain. I believe I may have a UTI. I have also been heaven pains in my left shoulder. Not sure if it's related happened all weekend. - Entered by patient    HPI Tamara Osborne is a 49 y.o. female.   The history is provided by the patient.   Patient presents with a 2-day history of urinary frequency, sore stomach, and pain in the left shoulder.  She denies fever, chills, injury, trauma, dysuria, flank pain, low back pain, radiation of pain, bruising, swelling, or vaginal symptoms.  Patient states that the shoulder symptoms started when she developed the urinary symptoms.  She states her last UTI was approximately 1 year ago.  States that she does have a history of chronic kidney disease.  States that she was started on new medications for her kidneys and for her cholesterol.  She states that the abdomen is sore to touch.  She also endorses an underlying history of diverticulosis.  Patient also states when the diverticulosis was discovered on imaging, she was also noted to have a pancreatic cyst.  She states that the pain in her shoulder comes and goes, and only last for a few seconds.  She states that it feels like a prickly pain.  Past Medical History:  Diagnosis Date   Anemia    Angio-edema    Anxiety    Arthritis    Bartholin cyst    Cervical cancer (HCC)    Chronic kidney disease    Constipation    Depression    Deviated septum    Diabetes mellitus without complication (HCC)    Difficult intravenous access    used port for 05-09-2019 surgery   Fibroids    Genetic testing 10/11/2023   Negative CancerNext-Expanded +RNAinsight panel. The CancerNext-Expanded gene panel offered by [facility] and includes sequencing, rearrangement, and RNA  analysis for the following 77 genes: AIP, ALK, APC, ATM, AXIN2, BAP1, BARD1, BMPR1A, BRCA1, BRCA2, BRIP1, CDC73, CDH1, CDK4, CDKN1B, CDKN2A, CEBPA, CHEK2, CTNNA1, DDX41, DICER1, EGFR, EPCAM, ETV6, FH, FLCN, GATA2, GREM1, HOXB13, KIT, LZTR1, MA   History of blood transfusion    2 units given 04-26-2019, has had total of 8 units since jan 2021   History of blood transfusion 1 unit given 05-30-19   iv fluids also given   History of cervical cancer 07/16/2022   History of kidney problems    History of radiation therapy 03/23/2019-05/04/2019   Cervical external beam   Dr Lynwood Nasuti   History of radiation therapy 05/09/2019-06/05/2019   vaginal brachytherapy   Dr Lynwood Nasuti   History of recent blood transfusion 05/16/2019   2 units given  per dr melrose [facility]   Hyperlipidemia    Hypertension    Neuropathy    both thumbs   Obesity    Palpitations    PONV (postoperative nausea and vomiting)    Prediabetes    Preeclampsia 2006   Sleep apnea    no cpap used insurance would not cover, osa severe per pt   SOB (shortness of breath)    Squamous cell carcinoma of cervix (HCC) 03/23/2019   Swallowing difficulty    Symptomatic skin lesion 07/14/2022   Vitamin D  deficiency  01/10/2020    Patient Active Problem List   Diagnosis Date Noted   Pancreatic cyst 10/09/2023   Diverticulosis 10/09/2023   Hepatic steatosis 08/15/2023   Morbid obesity (HCC) 07/10/2023   History of radiation therapy 05/21/2023   Difficult intravenous access 05/21/2023   HSV-2 infection 07/13/2022   Dyspareunia in female 10/23/2021   Hyperlipidemia associated with type 2 diabetes mellitus (HCC) 07/03/2021   Type 2 diabetes mellitus with stage 3b chronic kidney disease, without long-term current use of insulin  (HCC) 11/15/2020   OSA (obstructive sleep apnea) 07/31/2020   Other hyperlipidemia 01/10/2020   Class 2 severe obesity with serious comorbidity and body mass index (BMI) of 39.0 to 39.9 in adult 01/10/2020    Peripheral neuropathy due to chemotherapy 04/25/2019   CKD (chronic kidney disease), symptom management only, stage 3 (moderate) (HCC) 03/30/2019   Generalized anxiety disorder 03/30/2019   Hypertension associated with type 2 diabetes mellitus (HCC)    Squamous cell carcinoma of cervix (HCC) 03/23/2019   Deficiency anemia 03/22/2019    Past Surgical History:  Procedure Laterality Date   CESAREAN SECTION WITH BILATERAL TUBAL LIGATION  11/10/2004       COLONOSCOPY  12/14/2023   DILATION AND CURETTAGE OF UTERUS  1997   IR IMAGING GUIDED PORT INSERTION  04/04/2019   IR REMOVAL TUN ACCESS W/ PORT W/O FL MOD SED  10/27/2021   OPERATIVE ULTRASOUND N/A 05/09/2019   Procedure: OPERATIVE ULTRASOUND;  Surgeon: Shannon Agent, MD;  Location: [facility];  Service: Urology;  Laterality: N/A;   OPERATIVE ULTRASOUND N/A 05/15/2019   Procedure: OPERATIVE ULTRASOUND;  Surgeon: Shannon Agent, MD;  Location: [facility];  Service: Urology;  Laterality: N/A;   OPERATIVE ULTRASOUND N/A 05/22/2019   Procedure: OPERATIVE ULTRASOUND;  Surgeon: Shannon Agent, MD;  Location: [facility];  Service: Urology;  Laterality: N/A;   OPERATIVE ULTRASOUND N/A 05/29/2019   Procedure: OPERATIVE ULTRASOUND;  Surgeon: Shannon Agent, MD;  Location: [facility];  Service: Urology;  Laterality: N/A;   OPERATIVE ULTRASOUND N/A 06/05/2019   Procedure: OPERATIVE ULTRASOUND;  Surgeon: Shannon Agent, MD;  Location: [facility];  Service: Urology;  Laterality: N/A;   TANDEM RING INSERTION N/A 05/09/2019   Procedure: TANDEM RING INSERTION;  Surgeon: Shannon Agent, MD;  Location: [facility];  Service: Urology;  Laterality: N/A;   TANDEM RING INSERTION N/A 05/15/2019   Procedure: TANDEM RING INSERTION;  Surgeon: Shannon Agent, MD;  Location: [facility];  Service: Urology;  Laterality: N/A;   TANDEM RING INSERTION N/A 05/22/2019    Procedure: TANDEM RING INSERTION;  Surgeon: Shannon Agent, MD;  Location: [facility];  Service: Urology;  Laterality: N/A;   TANDEM RING INSERTION N/A 05/29/2019   Procedure: TANDEM RING INSERTION;  Surgeon: Shannon Agent, MD;  Location: [facility];  Service: Urology;  Laterality: N/A;   TANDEM RING INSERTION N/A 06/05/2019   Procedure: TANDEM RING INSERTION;  Surgeon: Shannon Agent, MD;  Location: [facility];  Service: Urology;  Laterality: N/A;    OB History     Gravida  6   Para  4   Term  3   Preterm  1   AB  2   Living  3      SAB  2   IAB      Ectopic      Multiple      Live Births  3  Home Medications    Prior to Admission medications  Medication Sig Start Date End Date Taking? Authorizing Provider  amLODipine -valsartan  (EXFORGE ) 10-320 MG tablet Take 1 tablet by mouth daily. 04/23/23  Yes Caudle, Thersia Bitters, FNP  buPROPion (WELLBUTRIN XL) 150 MG 24 hr tablet Take 150 mg by mouth daily. 06/28/23  Yes [provider]  Cholecalciferol 50 MCG (2000 UT) TABS Take by mouth. 03/06/21  Yes [provider]  KERENDIA 10 MG TABS Take 1 tablet by mouth daily. 12/27/23  Yes [provider]  rosuvastatin  (CRESTOR ) 20 MG tablet Take 1 tablet (20 mg total) by mouth daily. 01/05/24 04/04/24 Yes Raford Riggs, MD  Semaglutide , 2 MG/DOSE, 8 MG/3ML SOPN Inject 2 mg as directed once a week. 02/14/24  Yes Berkeley Adelita PENNER, MD  Accu-Chek Softclix Lancets lancets Use to check blood sugar TID. 11/14/20   Newlin, Enobong, MD  albuterol  (VENTOLIN  HFA) 108 (90 Base) MCG/ACT inhaler Inhale 2 puffs into the lungs every 4 (four) hours as needed. 02/16/24   Stuart Vernell Norris, PA-C  azelastine  (ASTELIN ) 0.1 % nasal spray Place 1 spray into both nostrils 2 (two) times daily as needed. Use in each nostril as directed 09/21/22   Tobie Arleta SQUIBB, MD  Blood Glucose Monitoring Suppl (ACCU-CHEK GUIDE)  w/Device KIT Use to check blood sugar TID. 11/14/20   Newlin, Enobong, MD  conjugated estrogens  (PREMARIN ) vaginal cream Place 1 Applicatorful vaginally every other day. Patient taking differently: Place 1 applicator vaginally every 3 (three) days. 10/22/23   McKenzie, Belvie CROME, MD  doxycycline  (VIBRAMYCIN ) 100 MG capsule Take 1 capsule (100 mg total) by mouth 2 (two) times daily. 02/16/24   Stuart Vernell Norris, PA-C  EPINEPHrine  (EPIPEN  2-PAK) 0.3 mg/0.3 mL IJ SOAJ injection Inject 0.3 mg into the muscle as needed for anaphylaxis. 09/21/22   Tobie Arleta SQUIBB, MD  fluticasone  (FLONASE ) 50 MCG/ACT nasal spray Place 2 sprays into both nostrils daily. 09/21/22   Tobie Arleta SQUIBB, MD  glucose blood (ACCU-CHEK GUIDE) test strip Use to check blood sugar TID. 11/14/20   Newlin, Enobong, MD  ipratropium (ATROVENT ) 0.06 % nasal spray Place 2 sprays into both nostrils 3 (three) times daily as needed for rhinitis (drainage). 09/21/22   Tobie Arleta SQUIBB, MD  levocetirizine (XYZAL ) 5 MG tablet Take 1 tablet (5 mg total) by mouth every evening. 11/24/23   Caudle, Thersia Bitters, FNP  mupirocin  ointment (BACTROBAN ) 2 % Apply 1 Application topically 2 (two) times daily. 09/16/23   Stuart Vernell Norris, PA-C  pregabalin  (LYRICA ) 25 MG capsule Take 1 capsule (25 mg total) by mouth 2 (two) times daily as needed (neuropathic pain). 11/24/23   Caudle, Thersia Bitters, FNP  promethazine -dextromethorphan (PROMETHAZINE -DM) 6.25-15 MG/5ML syrup Take 5 mLs by mouth 4 (four) times daily as needed. 02/16/24   Stuart Vernell Norris, PA-C  RESTASIS 0.05 % ophthalmic emulsion 1 drop 2 (two) times daily. 08/07/22   [provider]    Family History Family History  Problem Relation Age of Onset   Brain cancer Mother 7   Hypertension Mother    Depression Mother    Anxiety disorder Mother    Lung cancer Mother 70   Diabetes Father    Kidney disease Father    Kidney cancer Father 42   Heart attack Sister    Cervical cancer  Sister 40   Arthritis Sister    Heart failure Paternal Aunt    Heart attack Paternal Uncle    Cancer Maternal Grandmother  possibly lung cancer?   Kidney failure Maternal Grandfather    Leukemia Paternal Grandmother 48   Heart attack Paternal Grandfather    Brain cancer Paternal Cousin 57   Cervical cancer Maternal Cousin    Tourette syndrome Son    Healthy Son    Healthy Daughter    Healthy Daughter    Colon cancer Neg Hx    Esophageal cancer Neg Hx    Inflammatory bowel disease Neg Hx    Liver disease Neg Hx    Pancreatic cancer Neg Hx    Rectal cancer Neg Hx    Stomach cancer Neg Hx     Social History Social History[1]   Allergies   Shellfish allergy , Atorvastatin , Bee venom, Penicillins, and Sulfa antibiotics   Review of Systems Review of Systems Per HPI  Physical Exam Triage Vital Signs ED Triage Vitals  Encounter Vitals Group     BP 03/13/24 1346 121/88     Girls Systolic BP Percentile --      Girls Diastolic BP Percentile --      Boys Systolic BP Percentile --      Boys Diastolic BP Percentile --      Pulse Rate 03/13/24 1346 (!) 105     Resp 03/13/24 1346 18     Temp 03/13/24 1346 98.3 F (36.8 C)     Temp Source 03/13/24 1346 Oral     SpO2 03/13/24 1346 94 %     Weight --      Height --      Head Circumference --      Peak Flow --      Pain Score 03/13/24 1351 3     Pain Loc --      Pain Education --      Exclude from Growth Chart --    No data found.  Updated Vital Signs BP 121/88 (BP Location: Right Arm)   Pulse (!) 105   Temp 98.3 F (36.8 C) (Oral)   Resp 18   LMP  (LMP Unknown)   SpO2 94%   Visual Acuity Right Eye Distance:   Left Eye Distance:   Bilateral Distance:    Right Eye Near:   Left Eye Near:    Bilateral Near:     Physical Exam Vitals and nursing note reviewed.  Constitutional:      General: She is not in acute distress.    Appearance: Normal appearance.  HENT:     Head: Normocephalic.      Mouth/Throat:     Lips: Pink.     Mouth: Mucous membranes are moist.     Pharynx: Oropharynx is clear. Uvula midline.  Eyes:     Extraocular Movements: Extraocular movements intact.     Pupils: Pupils are equal, round, and reactive to light.  Cardiovascular:     Rate and Rhythm: Regular rhythm.     Pulses: Normal pulses.     Heart sounds: Normal heart sounds.  Pulmonary:     Effort: Pulmonary effort is normal. No respiratory distress.     Breath sounds: Normal breath sounds. No stridor. No wheezing, rhonchi or rales.  Abdominal:     General: Bowel sounds are normal.     Palpations: Abdomen is soft.     Tenderness: There is abdominal tenderness in the suprapubic area. There is no right CVA tenderness or left CVA tenderness.  Musculoskeletal:     Left shoulder: Normal. No swelling or deformity. Normal range of motion. Normal strength. Normal pulse.  Cervical back: Normal range of motion.  Skin:    General: Skin is warm and dry.  Neurological:     General: No focal deficit present.     Mental Status: She is alert and oriented to person, place, and time.  Psychiatric:        Mood and Affect: Mood normal.        Behavior: Behavior normal.      UC Treatments / Results  Labs (all labs ordered are listed, but only abnormal results are displayed) Labs Reviewed  POCT URINE DIPSTICK - Abnormal; Notable for the following components:      Result Value   Clarity, UA cloudy (*)    Blood, UA small (*)    POC PROTEIN,UA =100 (*)    All other components within normal limits  URINE CULTURE    EKG   Radiology No results found.  Procedures Procedures (including critical care time)  Medications Ordered in UC Medications - No data to display  Initial Impression / Assessment and Plan / UC Course  I have reviewed the triage vital signs and the nursing notes.  Pertinent labs & imaging results that were available during my care of the patient were reviewed by me and considered in  my medical decision making (see chart for details).  The urinalysis was negative for leukocytes or nitrites, it did contain protein, blood, and had a cloudy appearance.  Will send for urine culture.  Patient with complaints of pain in the left shoulder was started with her urinary symptoms, no injury or trauma noted on exam, she does have full range of motion of the left shoulder.  Difficult to determine the cause of the patient's symptoms.  Per review a vape prior urology note dated 10/22/2023, patient was noted to have a small hiatal hernia, and a 10 mm pancreatic cyst with recommendations to follow-up with MRI/MRCP or pancreatic protocol CT.  Patient also noted to have hepatic steatosis along with diverticulosis.  Patient was provided supportive care recommendations to include increasing fluids, allowing for plenty of rest, and over-the-counter analgesics for pain or discomfort.  Patient was given strict ER follow-up precautions, and was urged to follow-up with her primary care physician or urologist for worsening urinary symptoms if the urine culture is negative.  Patient also advised to follow-up with her gastroenterologist for further evaluation of the pancreatic cyst noted on her prior imaging.  Patient was in agreement with this plan of care and verbalizes understanding.  All questions were answered.  Patient stable for discharge.  Final Clinical Impressions(s) / UC Diagnoses   Final diagnoses:  Urinary frequency  Abdominal pain, unspecified abdominal location     Discharge Instructions      A urine culture is pending. You will be contacted if the pending test is abnormal. You will also have access to the results via MyChart. Increase your fluid intake and allow for plenty of rest. You may take Tylenol  for pain, fever or general discomfort. Please follow-up with gastroenterologist to discuss the necessity for a scan of your pancreas. If your symptoms worsen to include worsening abdominal  pain, fever, chills, nausea, vomiting or other concerns, please go to the emergency department for further evaluation. Follow-up with your primary care physician and urologist as scheduled. Follow-up as needed.     ED Prescriptions   None    PDMP not reviewed this encounter.     [1]  Social History Tobacco Use   Smoking status: Never    Passive  exposure: Past   Smokeless tobacco: Never  Vaping Use   Vaping status: Never Used  Substance Use Topics   Alcohol use: No   Drug use: No     Gilmer Etta PARAS, NP 03/13/24 1617  "

## 2024-03-13 NOTE — Telephone Encounter (Signed)
 Pt currently at [facility]. Will route to clinic at this time.    Message from Gamaliel C sent at 03/13/2024 10:25 AM EST  Summary: urinary concerns   Reason for Triage: The patient shares that they have been experiencing urinary discomfort since 03/11/24 and shares that they've also experienced increased urinary frequency. The patient would like to discuss their concerns further when possible

## 2024-03-13 NOTE — Discharge Instructions (Addendum)
 A urine culture is pending. You will be contacted if the pending test is abnormal. You will also have access to the results via MyChart. Increase your fluid intake and allow for plenty of rest. You may take Tylenol  for pain, fever or general discomfort. Please follow-up with gastroenterologist to discuss the necessity for a scan of your pancreas. If your symptoms worsen to include worsening abdominal pain, fever, chills, nausea, vomiting or other concerns, please go to the emergency department for further evaluation. Follow-up with your primary care physician and urologist as scheduled. Follow-up as needed.

## 2024-03-13 NOTE — ED Triage Notes (Signed)
 Lower abdominal pain, urinary frequency, pain with urination x 2 days. Pt states she is also having sharp shooting pain in her left shoulder that comes and goes x 2 days.

## 2024-03-14 LAB — URINE CULTURE

## 2024-03-15 ENCOUNTER — Other Ambulatory Visit

## 2024-03-15 ENCOUNTER — Ambulatory Visit (HOSPITAL_COMMUNITY): Payer: Self-pay

## 2024-03-15 ENCOUNTER — Telehealth: Payer: Self-pay

## 2024-03-15 ENCOUNTER — Other Ambulatory Visit: Payer: Self-pay

## 2024-03-15 DIAGNOSIS — N390 Urinary tract infection, site not specified: Secondary | ICD-10-CM

## 2024-03-15 LAB — MICROSCOPIC EXAMINATION: Bacteria, UA: NONE SEEN

## 2024-03-15 LAB — URINALYSIS, ROUTINE W REFLEX MICROSCOPIC
Bilirubin, UA: NEGATIVE
Glucose, UA: NEGATIVE
Ketones, UA: NEGATIVE
Leukocytes,UA: NEGATIVE
Nitrite, UA: NEGATIVE
Specific Gravity, UA: 1.02 (ref 1.005–1.030)
Urobilinogen, Ur: 0.2 mg/dL (ref 0.2–1.0)
pH, UA: 6 (ref 5.0–7.5)

## 2024-03-15 NOTE — Telephone Encounter (Signed)
 Patient presents today with complaints of  sore and pressure .  UA and Culture done today.  Dr. Sherrilee  reviewed results and no treatment .  Patient aware of MD recommendations and that we will reach out with culture results.      [email or web address], CMA

## 2024-03-15 NOTE — Telephone Encounter (Signed)
 Dysuria  Patient called with c/o dysuria x 1 week days.  Pain: sores and pressure while emptying bladder  Severity:5/10  Associated Signs and Symptoms:  Fever: noTemp.0 Chills: no Hematuria: yes Urgency: yes Frequency: yes Hesitancy:no Incontinence: no Nausea: no Vomiting: no  Urologic History:  Any Recent Urologic Surgeries or Procedures:no Recurrent UTI's:yes Cystitis: no  Prostatitis:no Kidney or Bladder Stones: no Kidney diease  Plan: Walk-in Clinic: no Appointment w/Physician: [no Lab visit scheduled for urine drop off: Yes Advice given:  Do you take on daily medications for UTI suppression No

## 2024-03-17 ENCOUNTER — Ambulatory Visit: Payer: Self-pay

## 2024-03-17 LAB — URINE CULTURE

## 2024-03-20 ENCOUNTER — Ambulatory Visit (INDEPENDENT_AMBULATORY_CARE_PROVIDER_SITE_OTHER): Admitting: Family

## 2024-03-20 ENCOUNTER — Encounter (HOSPITAL_BASED_OUTPATIENT_CLINIC_OR_DEPARTMENT_OTHER): Payer: Self-pay | Admitting: Family

## 2024-03-20 VITALS — BP 130/68 | HR 90 | Ht 61.0 in | Wt 229.0 lb

## 2024-03-20 DIAGNOSIS — E785 Hyperlipidemia, unspecified: Secondary | ICD-10-CM

## 2024-03-20 DIAGNOSIS — I10 Essential (primary) hypertension: Secondary | ICD-10-CM | POA: Diagnosis not present

## 2024-03-20 DIAGNOSIS — G4733 Obstructive sleep apnea (adult) (pediatric): Secondary | ICD-10-CM | POA: Diagnosis not present

## 2024-03-20 DIAGNOSIS — N183 Chronic kidney disease, stage 3 unspecified: Secondary | ICD-10-CM

## 2024-03-20 NOTE — Progress Notes (Signed)
 "  Advanced [facility]pertension Clinic Assessment:    Date:  03/20/2024   ID:  Tamara Osborne, DOB 11-07-1975, MRN [id number]  PCP:  Knute Thersia Bitters, FNP  Cardiologist:  Annabella Scarce, MD  Nephrologist: Dr. Jerrye  Referring MD: Knute Thersia Bitters, FNP   CC: Hypertension  History of Present Illness:    Tamara Osborne is a 49 y.o. female with a hx of hypertension, CKD 3, CAD, depression, preeclampsia, hyperlipidemia, OSA, diabetes, squamous cell carcinoma of cervix s/p radiation and chemotherapy, morbid obesity. Here to follow up in the [facility].  Family history of heart disease and cancer with both parents having died of cancer and a sister with a massive MI and cancer.  Prior echo 2021 LVEF 60 to 75%, moderate LVH, grade 1 diastolic dysfunction.  Established with [facility] 10/2022.  Diagnosed with hypertension since her 60s.  She had preeclampsia with pregnancy.  Known element of whitecoat hypertension superimposed on hypertension.  Renal artery Dopplers 11/2022 with no stenosis.  Statin previously discontinued due to myalgias.  Chlorthalidone  previously reduced to 12.5 mg daily due to low blood pressure.  At last visit she was awaiting GI evaluation for pancreatic cyst.  Calcium  score ordered due to hyperlipidemia revealing calcium  score 387 placing her in the 99th percentile.  She was recommended to start rosuvastatin .  Presents today for follow-up. Recent issues with a urinary tract infection. She has stopped her Kerendia due to this and plans to discuss with Dr. Jerrye. She had repeat urine culture and is awaiting return call from urology. Prior to this had been almost a year without a UTI. Some numbness in her right thigh consistent with nerve pain. Some left shoulder pain at rest, this is the side she lays on. Discussed atypical for cardiac related pain, reassurance provided. Blood pressure at home has been well controlled. She waits to  take her medication until she has food in her system, mid-morning. Does take routinely. She is overall tolerating Rosuvastatin  with only mild morning stiffness though much better than when previously on Atorvastatin .   Previous antihypertensives: Lisinopril-skin following sensations  Past Medical History:  Diagnosis Date   Anemia    Angio-edema    Anxiety    Arthritis    Bartholin cyst    Cervical cancer (HCC)    Chronic kidney disease    Constipation    Depression    Deviated septum    Diabetes mellitus without complication (HCC)    Difficult intravenous access    used port for 05-09-2019 surgery   Fibroids    Genetic testing 10/11/2023   Negative CancerNext-Expanded +RNAinsight panel. The CancerNext-Expanded gene panel offered by [facility] and includes sequencing, rearrangement, and RNA analysis for the following 77 genes: AIP, ALK, APC, ATM, AXIN2, BAP1, BARD1, BMPR1A, BRCA1, BRCA2, BRIP1, CDC73, CDH1, CDK4, CDKN1B, CDKN2A, CEBPA, CHEK2, CTNNA1, DDX41, DICER1, EGFR, EPCAM, ETV6, FH, FLCN, GATA2, GREM1, HOXB13, KIT, LZTR1, MA   History of blood transfusion    2 units given 04-26-2019, has had total of 8 units since jan 2021   History of blood transfusion 1 unit given 05-30-19   iv fluids also given   History of cervical cancer 07/16/2022   History of kidney problems    History of radiation therapy 03/23/2019-05/04/2019   Cervical external beam   Dr Lynwood Nasuti   History of radiation therapy 05/09/2019-06/05/2019   vaginal brachytherapy   Dr Lynwood Nasuti   History of recent blood transfusion 05/16/2019   2  units given  [facility] dr melrose [facility]   Hyperlipidemia    Hypertension    Neuropathy    both thumbs   Obesity    Palpitations    PONV (postoperative nausea and vomiting)    Prediabetes    Preeclampsia 2006   Sleep apnea    no cpap used insurance would not cover, osa severe per pt   SOB (shortness of breath)    Squamous cell carcinoma of cervix (HCC) 03/23/2019    Swallowing difficulty    Symptomatic skin lesion 07/14/2022   Vitamin D  deficiency 01/10/2020    Past Surgical History:  Procedure Laterality Date   CESAREAN SECTION WITH BILATERAL TUBAL LIGATION  11/10/2004       COLONOSCOPY  12/14/2023   DILATION AND CURETTAGE OF UTERUS  1997   IR IMAGING GUIDED PORT INSERTION  04/04/2019   IR REMOVAL TUN ACCESS W/ PORT W/O FL MOD SED  10/27/2021   OPERATIVE ULTRASOUND N/A 05/09/2019   Procedure: OPERATIVE ULTRASOUND;  Surgeon: Shannon Agent, MD;  Location: [facility];  Service: Urology;  Laterality: N/A;   OPERATIVE ULTRASOUND N/A 05/15/2019   Procedure: OPERATIVE ULTRASOUND;  Surgeon: Shannon Agent, MD;  Location: [facility];  Service: Urology;  Laterality: N/A;   OPERATIVE ULTRASOUND N/A 05/22/2019   Procedure: OPERATIVE ULTRASOUND;  Surgeon: Shannon Agent, MD;  Location: [facility];  Service: Urology;  Laterality: N/A;   OPERATIVE ULTRASOUND N/A 05/29/2019   Procedure: OPERATIVE ULTRASOUND;  Surgeon: Shannon Agent, MD;  Location: [facility];  Service: Urology;  Laterality: N/A;   OPERATIVE ULTRASOUND N/A 06/05/2019   Procedure: OPERATIVE ULTRASOUND;  Surgeon: Shannon Agent, MD;  Location: [facility];  Service: Urology;  Laterality: N/A;   TANDEM RING INSERTION N/A 05/09/2019   Procedure: TANDEM RING INSERTION;  Surgeon: Shannon Agent, MD;  Location: [facility];  Service: Urology;  Laterality: N/A;   TANDEM RING INSERTION N/A 05/15/2019   Procedure: TANDEM RING INSERTION;  Surgeon: Shannon Agent, MD;  Location: [facility];  Service: Urology;  Laterality: N/A;   TANDEM RING INSERTION N/A 05/22/2019   Procedure: TANDEM RING INSERTION;  Surgeon: Shannon Agent, MD;  Location: [facility];  Service: Urology;  Laterality: N/A;   TANDEM RING INSERTION N/A 05/29/2019   Procedure: TANDEM RING INSERTION;  Surgeon: Shannon Agent, MD;   Location: [facility];  Service: Urology;  Laterality: N/A;   TANDEM RING INSERTION N/A 06/05/2019   Procedure: TANDEM RING INSERTION;  Surgeon: Shannon Agent, MD;  Location: [facility];  Service: Urology;  Laterality: N/A;    Current Medications: Active Medications[1]   Allergies:   Shellfish allergy , Atorvastatin , Bee venom, Penicillins, and Sulfa antibiotics   Social History   Socioeconomic History   Marital status: Single    Spouse name: Not on file   Number of children: 4   Years of education: Not on file   Highest education level: Not on file  Occupational History   Not on file  Tobacco Use   Smoking status: Never    Passive exposure: Past   Smokeless tobacco: Never  Vaping Use   Vaping status: Never Used  Substance and Sexual Activity   Alcohol use: No   Drug use: No   Sexual activity: Not Currently    Birth control/protection: Surgical  Other Topics Concern   Not on file  Social History Narrative   Lives with her boyfriend  Social Drivers of Health   Tobacco Use: Low Risk (03/20/2024)   Patient History    Smoking Tobacco Use: Never    Smokeless Tobacco Use: Never    Passive Exposure: Past  Financial Resource Strain: High Risk (04/23/2023)   Overall Financial Resource Strain (CARDIA)    Difficulty of Paying Living Expenses: Very hard  Food Insecurity: No Food Insecurity (10/08/2022)   Hunger Vital Sign    Worried About Running Out of Food in the Last Year: Never true    Ran Out of Food in the Last Year: Never true  Transportation Needs: No Transportation Needs (10/08/2022)   PRAPARE - Administrator, Civil Service (Medical): No    Lack of Transportation (Non-Medical): No  Physical Activity: Insufficiently Active (10/08/2022)   Exercise Vital Sign    Days of Exercise per Week: 2 days    Minutes of Exercise per Session: 40 min  Stress: No Stress Concern Present (04/23/2023)   [employer] of Occupational Health -  Occupational Stress Questionnaire    Feeling of Stress : Not at all  Social Connections: Socially Isolated (04/23/2023)   Social Connection and Isolation Panel    Frequency of Communication with Friends and Family: More than three times a week    Frequency of Social Gatherings with Friends and Family: Once a week    Attends Religious Services: Never    Database Administrator or Organizations: No    Attends Banker Meetings: Never    Marital Status: Never married  Depression (PHQ2-9): Medium Risk (11/24/2023)   Depression (PHQ2-9)    PHQ-2 Score: 6  Alcohol Screen: Low Risk (10/08/2022)   Alcohol Screen    Last Alcohol Screening Score (AUDIT): 0  Housing: Low Risk (10/08/2022)   Housing    Last Housing Risk Score: 0  Utilities: Not At Risk (10/08/2022)   AHC Utilities    Threatened with loss of utilities: No  Health Literacy: Adequate Health Literacy (04/23/2023)   B1300 Health Literacy    Frequency of need for help with medical instructions: Never     Family History: The patient's family history includes Anxiety disorder in her mother; Arthritis in her sister; Brain cancer (age of onset: 56) in her mother; Brain cancer (age of onset: 16) in her paternal cousin; Cancer in her maternal grandmother; Cervical cancer in her maternal cousin; Cervical cancer (age of onset: 24) in her sister; Depression in her mother; Diabetes in her father; Healthy in her daughter, daughter, and son; Heart attack in her paternal grandfather, paternal uncle, and sister; Heart failure in her paternal aunt; Hypertension in her mother; Kidney cancer (age of onset: 44) in her father; Kidney disease in her father; Kidney failure in her maternal grandfather; Leukemia (age of onset: 50) in her paternal grandmother; Lung cancer (age of onset: 71) in her mother; Tourette syndrome in her son. There is no history of Colon cancer, Esophageal cancer, Inflammatory bowel disease, Liver disease, Pancreatic cancer, Rectal  cancer, or Stomach cancer.  ROS:   Please see the history of present illness.     All other systems reviewed and are negative.  EKGs/Labs/Other Studies Reviewed:    EKG Interpretation Date/Time:  Monday March 20 2024 10:35:44 EST Ventricular Rate:  90 PR Interval:  174 QRS Duration:  88 QT Interval:  358 QTC Calculation: 437 R Axis:   52  Text Interpretation: Normal sinus rhythm Stable anterolateral TWI. Confirmed by Vannie Mora ([id number]) on 03/20/2024 10:42:45 AM  Recent Labs: 12/27/2023: BUN 22; Creatinine 1.6; Potassium 4.4; Sodium 140   Recent Lipid Panel    Component Value Date/Time   CHOL 213 (H) 08/04/2023 0936   TRIG 125 08/04/2023 0936   HDL 41 08/04/2023 0936   CHOLHDL 5.2 (H) 08/04/2023 0936   CHOLHDL 6.0 06/05/2021 1132   VLDL 26 06/05/2021 1132   LDLCALC 149 (H) 08/04/2023 0936    Physical Exam:   VS:  BP 130/68   Pulse 90   Ht 5' 1 (1.549 m)   Wt 229 lb (103.9 kg)   LMP  (LMP Unknown)   SpO2 98%   BMI 43.27 kg/m  , BMI Body mass index is 43.27 kg/m.  Vitals:   03/20/24 1031 03/20/24 1109  BP: (!) 160/100 130/68  Pulse: 90   Height: 5' 1 (1.549 m)   Weight: 229 lb (103.9 kg)   SpO2: 98%   BMI (Calculated): 43.29     GENERAL:  Well appearing, overweight HEENT: Pupils equal round and reactive, fundi not visualized, oral mucosa unremarkable NECK:  No jugular venous distention, waveform within normal limits, carotid upstroke brisk and symmetric, no bruits, no thyromegaly LYMPHATICS:  No cervical adenopathy LUNGS:  Clear to auscultation bilaterally HEART:  RRR.  PMI not displaced or sustained,S1 and S2 within normal limits, no S3, no S4, no clicks, no rubs, no murmurs ABD:  Flat, positive bowel sounds normal in frequency in pitch, no bruits, no rebound, no guarding, no midline pulsatile mass, no hepatomegaly, no splenomegaly EXT:  2 plus pulses throughout, no edema, no cyanosis no clubbing SKIN:  No rashes no nodules NEURO:  Cranial  nerves II through XII grossly intact, motor grossly intact throughout PSYCH:  Cognitively intact, oriented to person place and time   ASSESSMENT/PLAN:    HTN - BP elevated in clinic. Home readings have been well controlled. Discussed to monitor BP at home at least 2 hours after medications and sitting for 5-10 minutes. BP improved from 160/100  to 130/68 without intervention.  Continue Amlodipine -Valsartan  10-320mg  daily.   CKD 3-follows with Dr. Jerrye of nephrology. Presently holding Kerendia after UTI, plans to discuss with Dr. Jerrye.  CAD/HLD, LDL goal less than 70 - present left shoulder pain atypical for angina. No indication for ischemic evaluation. EKG today NSR with stable anterolateral TWI. GDMT Rosuvastatin  20mg  daily. Recommend aiming for 150 minutes of moderate intensity activity per week and following a heart healthy diet.  Previously did not tolerate Atorvastatin . Tolerating Rosuvastatin  without issue.  Due for lipid panel, CMP as she is not fasting today she will return to have this collected in the next  month. If LDL not at goal <70, consider increasing dose.   OSA -follows with neurology. CPAP compliance encouraged. Wearing regularly.   Screening for Secondary Hypertension:     10/08/2022   11:36 AM  Causes  Drugs/Herbals Screened     - Comments Limits sodium.  No EtOH.  Occasional coffee.  Renovascular HTN Screened     - Comments Check renal artery Dopplers  Sleep Apnea Screened     - Comments Uses CPAP  Thyroid Disease Screened  Hyperaldosteronism Screened     - Comments Check renin and aldosterone  Pheochromocytoma N/A  Cushing's Syndrome Screened     - Comments Check AM cortisol  Hyperparathyroidism Screened  Coarctation of the Aorta Screened     - Comments BP symmetric  Compliance Screened     - Comments out of chlorthalidone  for a month  Relevant Labs/Studies:    Latest Ref Rng & Units 12/27/2023   12:00 AM 06/09/2023   12:00 AM 12/09/2022   12:03 PM   Basic Labs  Sodium 137 - 147 140     140     140      Potassium 3.5 - 5.1 mEq/L 4.4     4.8     4.3      Creatinine 0.5 - 1.1 1.6     1.4     1.6         This result is from an external source.       Latest Ref Rng & Units 08/06/2022   11:58 AM 09/20/2019   11:06 AM  Thyroid   TSH 0.450 - 4.500 uIU/mL 1.850  1.910                 11/24/2022   12:01 PM  Renovascular   Renal Artery US  Completed Yes     Disposition:    FU with MD/APP/PharmD in 6 months    Medication Adjustments/Labs and Tests Ordered: Current medicines are reviewed at length with the patient today.  Concerns regarding medicines are outlined above.  Orders Placed This Encounter  Procedures   EKG 12-Lead   No orders of the defined types were placed in this encounter.    Signed, Reche GORMAN Finder, NP  03/20/2024 11:10 AM    [facility]     [1]  Current Meds  Medication Sig   Accu-Chek Softclix Lancets lancets Use to check blood sugar TID.   amLODipine -valsartan  (EXFORGE ) 10-320 MG tablet Take 1 tablet by mouth daily.   azelastine  (ASTELIN ) 0.1 % nasal spray Place 1 spray into both nostrils 2 (two) times daily as needed. Use in each nostril as directed   Blood Glucose Monitoring Suppl (ACCU-CHEK GUIDE) w/Device KIT Use to check blood sugar TID.   buPROPion (WELLBUTRIN XL) 150 MG 24 hr tablet Take 150 mg by mouth daily.   Cholecalciferol 50 MCG (2000 UT) TABS Take by mouth.   conjugated estrogens  (PREMARIN ) vaginal cream Place 1 Applicatorful vaginally every other day. (Patient taking differently: Place 1 applicator vaginally every 3 (three) days.)   EPINEPHrine  (EPIPEN  2-PAK) 0.3 mg/0.3 mL IJ SOAJ injection Inject 0.3 mg into the muscle as needed for anaphylaxis.   fluticasone  (FLONASE ) 50 MCG/ACT nasal spray Place 2 sprays into both nostrils daily.   glucose blood (ACCU-CHEK GUIDE) test strip Use to check blood sugar TID.   ipratropium (ATROVENT ) 0.06 % nasal spray Place 2  sprays into both nostrils 3 (three) times daily as needed for rhinitis (drainage).   levocetirizine (XYZAL ) 5 MG tablet Take 1 tablet (5 mg total) by mouth every evening.   mupirocin  ointment (BACTROBAN ) 2 % Apply 1 Application topically 2 (two) times daily.   RESTASIS 0.05 % ophthalmic emulsion 1 drop 2 (two) times daily.   rosuvastatin  (CRESTOR ) 20 MG tablet Take 1 tablet (20 mg total) by mouth daily.   Semaglutide , 2 MG/DOSE, 8 MG/3ML SOPN Inject 2 mg as directed once a week.   [DISCONTINUED] pregabalin  (LYRICA ) 25 MG capsule Take 1 capsule (25 mg total) by mouth 2 (two) times daily as needed (neuropathic pain).   Current Facility-Administered Medications for the 03/20/24 encounter (Office Visit) with Finder Reche GORMAN, NP  Medication   0.9 %  sodium chloride  infusion   "

## 2024-03-20 NOTE — Patient Instructions (Addendum)
 Medication Instructions:  Continue your current medications.    Labwork: Your physician recommends that you return for lab work within the next month for fasting lipid panel, CMP.   Testing/Procedures: Your EKG today was stable from previous which is a good result.    Follow-Up: Please follow up in 6 months in ADV HTN CLINIC with Dr. Raford, Reche Finder, NP or Allean Mink PharmD   Special Instructions:    [facility] (to inquire about Leonore): [phone] Urology (to inquire about urine culture results) - Dr. Sherrilee: [phone] GI (Dr. Wilhelmenia) regarding pancreas scan: [phone]  Heart Healthy Diet Recommendations: A low-salt diet is recommended. Meats should be grilled, baked, or boiled. Avoid fried foods. Focus on lean protein sources like fish or chicken with vegetables and fruits. The American Heart Association is a Chief Technology Officer!  American Heart Association Diet and Lifeystyle Recommendations   Exercise recommendations: The American Heart Association recommends 150 minutes of moderate intensity exercise weekly. Try 30 minutes of moderate intensity exercise 4-5 times per week. This could include walking, jogging, or swimming.

## 2024-03-21 ENCOUNTER — Other Ambulatory Visit: Payer: Self-pay | Admitting: Medical Genetics

## 2024-03-21 DIAGNOSIS — Z006 Encounter for examination for normal comparison and control in clinical research program: Secondary | ICD-10-CM

## 2024-03-27 ENCOUNTER — Ambulatory Visit (INDEPENDENT_AMBULATORY_CARE_PROVIDER_SITE_OTHER): Admitting: Family Medicine

## 2024-03-28 ENCOUNTER — Encounter (INDEPENDENT_AMBULATORY_CARE_PROVIDER_SITE_OTHER): Payer: Self-pay

## 2024-03-29 ENCOUNTER — Telehealth (INDEPENDENT_AMBULATORY_CARE_PROVIDER_SITE_OTHER): Payer: Self-pay | Admitting: Family Medicine

## 2024-03-29 NOTE — Telephone Encounter (Signed)
 Called pt and she got the message but is sick with the flu and will call to r/s.

## 2024-03-30 ENCOUNTER — Other Ambulatory Visit

## 2024-04-04 ENCOUNTER — Ambulatory Visit: Payer: Self-pay

## 2024-04-04 ENCOUNTER — Ambulatory Visit: Admitting: Family Medicine

## 2024-04-04 ENCOUNTER — Encounter: Payer: Self-pay | Admitting: Family Medicine

## 2024-04-04 VITALS — BP 130/80 | HR 94 | Temp 98.2°F | Wt 221.3 lb

## 2024-04-04 DIAGNOSIS — J209 Acute bronchitis, unspecified: Secondary | ICD-10-CM | POA: Diagnosis not present

## 2024-04-04 MED ORDER — BENZONATATE 100 MG PO CAPS: 100.0000 mg | ORAL_CAPSULE | Freq: Three times a day (TID) | ORAL | 0 refills | Status: AC | PRN

## 2024-04-04 NOTE — Telephone Encounter (Signed)
 FYI Only or Action Required?: FYI only for provider: appointment scheduled on 2/3.  Patient was last seen in primary care on 02/14/2024 by [place] Adelita PENNER, MD.  Called Nurse Triage reporting Influenza.  Symptoms began a week ago.  Interventions attempted: OTC medications: Coricidin and Rest, hydration, or home remedies.  Symptoms are: gradually worsening.  Triage Disposition: See HCP Within 4 Hours (Or PCP Triage)  Patient/caregiver understands and will follow disposition?: Yes  Message from [employer] G sent at 04/04/2024 10:02 AM EST  Reason for Triage: can barely move, possible flu, persistant cough ( causing her to pee )   Reason for Disposition  MILD difficulty breathing (e.g., minimal/no SOB at rest, SOB with walking, pulse < 100)  Answer Assessment - Initial Assessment Questions Concerned for the flu for the last 2 weeks. Persistent cough causing incontinence (wearing depends), sore throat initially but resolved, fatigue, body aches, fever not measured but has fever blisters, diarrhea. Feels like she has no energy. Taking Coricidin HTN formula with mild relief Denies CP, Dizziness, earache. Reports SOB after coughing fits.   OTC - Coricidin for HTN  Appt with regional office today for evaluation- understands ED precautions .   1. SYMPTOMS: What is your main symptom or concern? (e.g., cough, fever, shortness of breath, muscle aches)     Cough, fever, body aches, fatigue, SOB with coughing fits  2. ONSET: When did the symptoms start?      2 weeks- worse over the last week  3. COUGH: Do you have a cough? If Yes, ask: How bad is the cough?       Moderate to severe coughing fits  4. FEVER: Do you have a fever? If Yes, ask: What is your temperature, how was it measured, and when did it start?     Had but didn't measure- has fever blister  5. BREATHING DIFFICULTY: Are you having any difficulty breathing? (e.g., normal; shortness of breath, wheezing, unable to  speak)      SOB after coughing fits  6. BETTER-SAME-WORSE: Are you getting better, staying the same or getting worse compared to yesterday?  If getting worse, ask, In what way?     worse 7. OTHER SYMPTOMS: Do you have any other symptoms?  (e.g., chills, fatigue, headache, loss of smell or taste, muscle pain, sore throat)     Sore throat initially, fever blisters, chills, body aches, fatigue 8. INFLUENZA EXPOSURE: Was there any known exposure to influenza (flu) before the symptoms began?      Unsure if exposed 9. INFLUENZA SUSPECTED: Why do you think you have influenza? (e.g., positive flu self-test at home, symptoms after exposure).     Symptoms  10. INFLUENZA VACCINE: Have you had the flu vaccine? If Yes, ask: When did you last get it?       Denies- gets sick every time she gets vaccine so hasn't gotten  11. HIGH RISK FOR COMPLICATIONS: Do you have any chronic medical problems? (e.g., asthma, heart or lung disease, obesity, weak immune system)       OSA, HTN, HLD, bladder cancer hx 12. PREGNANCY: Is there any chance you are pregnant? When was your last menstrual period?       Denies  Protocols used: Influenza (Flu) Suspected-A-AH

## 2024-04-26 ENCOUNTER — Ambulatory Visit: Admitting: Urology

## 2024-06-06 ENCOUNTER — Ambulatory Visit: Admitting: Gynecologic Oncology

## 2025-01-08 ENCOUNTER — Ambulatory Visit: Admitting: Neurology
